# Patient Record
Sex: Female | Born: 1940 | Race: White | Hispanic: No | Marital: Married | Smoking: Never smoker
Health system: Southern US, Community
[De-identification: ages and names within clinical notes are randomized; demographics above are authoritative.]

## PROBLEM LIST (undated history)

## (undated) DIAGNOSIS — G4730 Sleep apnea, unspecified: Secondary | ICD-10-CM

## (undated) DIAGNOSIS — D631 Anemia in chronic kidney disease: Secondary | ICD-10-CM

## (undated) DIAGNOSIS — E1140 Type 2 diabetes mellitus with diabetic neuropathy, unspecified: Secondary | ICD-10-CM

## (undated) DIAGNOSIS — I10 Essential (primary) hypertension: Secondary | ICD-10-CM

## (undated) DIAGNOSIS — F419 Anxiety disorder, unspecified: Secondary | ICD-10-CM

## (undated) DIAGNOSIS — M1990 Unspecified osteoarthritis, unspecified site: Secondary | ICD-10-CM

## (undated) DIAGNOSIS — Z952 Presence of prosthetic heart valve: Secondary | ICD-10-CM

## (undated) DIAGNOSIS — E785 Hyperlipidemia, unspecified: Secondary | ICD-10-CM

## (undated) DIAGNOSIS — F03918 Unspecified dementia, unspecified severity, with other behavioral disturbance: Secondary | ICD-10-CM

## (undated) DIAGNOSIS — E669 Obesity, unspecified: Secondary | ICD-10-CM

## (undated) DIAGNOSIS — N189 Chronic kidney disease, unspecified: Secondary | ICD-10-CM

## (undated) DIAGNOSIS — K219 Gastro-esophageal reflux disease without esophagitis: Secondary | ICD-10-CM

## (undated) DIAGNOSIS — I639 Cerebral infarction, unspecified: Secondary | ICD-10-CM

## (undated) DIAGNOSIS — E559 Vitamin D deficiency, unspecified: Secondary | ICD-10-CM

## (undated) DIAGNOSIS — G709 Myoneural disorder, unspecified: Secondary | ICD-10-CM

## (undated) DIAGNOSIS — Z973 Presence of spectacles and contact lenses: Secondary | ICD-10-CM

## (undated) HISTORY — PX: COLONOSCOPY: SHX174

## (undated) HISTORY — DX: Type 2 diabetes mellitus with diabetic neuropathy, unspecified: E11.40

## (undated) HISTORY — DX: Obesity, unspecified: E66.9

## (undated) HISTORY — PX: CARDIAC CATHETERIZATION: SHX172

## (undated) HISTORY — PX: HEMORRHOID SURGERY: SHX153

## (undated) HISTORY — PX: LUMBAR FUSION: SHX111

## (undated) HISTORY — PX: CERVICAL SPINE SURGERY: SHX589

## (undated) HISTORY — DX: Essential (primary) hypertension: I10

## (undated) HISTORY — DX: Unspecified osteoarthritis, unspecified site: M19.90

## (undated) HISTORY — PX: ANKLE SURGERY: SHX546

## (undated) HISTORY — DX: Hyperlipidemia, unspecified: E78.5

## (undated) HISTORY — DX: Presence of prosthetic heart valve: Z95.2

---

## 1994-03-20 HISTORY — PX: AORTIC VALVE REPLACEMENT: SHX41

## 1998-05-26 ENCOUNTER — Encounter: Admission: RE | Admit: 1998-05-26 | Discharge: 1998-06-09 | Payer: Self-pay | Admitting: Neurological Surgery

## 1998-08-02 ENCOUNTER — Ambulatory Visit (HOSPITAL_COMMUNITY): Admission: RE | Admit: 1998-08-02 | Discharge: 1998-08-02 | Payer: Self-pay | Admitting: Chiropractic Medicine

## 1998-08-02 ENCOUNTER — Encounter: Payer: Self-pay | Admitting: Chiropractic Medicine

## 1999-04-13 ENCOUNTER — Inpatient Hospital Stay (HOSPITAL_COMMUNITY): Admission: RE | Admit: 1999-04-13 | Discharge: 1999-04-18 | Payer: Self-pay | Admitting: Cardiology

## 2001-03-07 ENCOUNTER — Ambulatory Visit (HOSPITAL_COMMUNITY): Admission: RE | Admit: 2001-03-07 | Discharge: 2001-03-07 | Payer: Self-pay | Admitting: Cardiology

## 2002-05-06 ENCOUNTER — Encounter: Payer: Self-pay | Admitting: Family Medicine

## 2002-05-06 ENCOUNTER — Ambulatory Visit (HOSPITAL_COMMUNITY): Admission: RE | Admit: 2002-05-06 | Discharge: 2002-05-06 | Payer: Self-pay | Admitting: Family Medicine

## 2002-07-03 ENCOUNTER — Ambulatory Visit (HOSPITAL_COMMUNITY): Admission: RE | Admit: 2002-07-03 | Discharge: 2002-07-03 | Payer: Self-pay | Admitting: Neurology

## 2002-07-03 ENCOUNTER — Encounter: Payer: Self-pay | Admitting: Neurology

## 2003-07-24 ENCOUNTER — Emergency Department (HOSPITAL_COMMUNITY): Admission: EM | Admit: 2003-07-24 | Discharge: 2003-07-24 | Payer: Self-pay | Admitting: Emergency Medicine

## 2003-08-25 ENCOUNTER — Ambulatory Visit (HOSPITAL_COMMUNITY): Admission: RE | Admit: 2003-08-25 | Discharge: 2003-08-25 | Payer: Self-pay | Admitting: Neurology

## 2003-10-21 ENCOUNTER — Ambulatory Visit (HOSPITAL_COMMUNITY): Admission: RE | Admit: 2003-10-21 | Discharge: 2003-10-21 | Payer: Self-pay | Admitting: Cardiology

## 2004-03-02 ENCOUNTER — Ambulatory Visit: Payer: Self-pay | Admitting: *Deleted

## 2004-05-02 ENCOUNTER — Ambulatory Visit: Payer: Self-pay | Admitting: Cardiology

## 2004-05-10 ENCOUNTER — Ambulatory Visit (HOSPITAL_COMMUNITY): Admission: RE | Admit: 2004-05-10 | Discharge: 2004-05-10 | Payer: Self-pay | Admitting: Obstetrics and Gynecology

## 2004-06-01 ENCOUNTER — Ambulatory Visit: Payer: Self-pay | Admitting: Cardiovascular Disease

## 2004-06-29 ENCOUNTER — Ambulatory Visit: Payer: Self-pay | Admitting: *Deleted

## 2004-07-27 ENCOUNTER — Ambulatory Visit: Payer: Self-pay | Admitting: *Deleted

## 2004-08-17 ENCOUNTER — Ambulatory Visit: Payer: Self-pay | Admitting: *Deleted

## 2004-10-12 ENCOUNTER — Ambulatory Visit: Payer: Self-pay | Admitting: *Deleted

## 2004-11-14 ENCOUNTER — Ambulatory Visit: Payer: Self-pay | Admitting: Cardiology

## 2004-12-13 ENCOUNTER — Ambulatory Visit: Payer: Self-pay | Admitting: *Deleted

## 2005-01-05 ENCOUNTER — Ambulatory Visit: Payer: Self-pay | Admitting: Cardiology

## 2005-02-02 ENCOUNTER — Ambulatory Visit: Payer: Self-pay | Admitting: *Deleted

## 2005-02-16 ENCOUNTER — Ambulatory Visit: Payer: Self-pay | Admitting: *Deleted

## 2005-03-21 ENCOUNTER — Ambulatory Visit: Payer: Self-pay | Admitting: *Deleted

## 2005-06-02 ENCOUNTER — Ambulatory Visit: Payer: Self-pay | Admitting: Cardiology

## 2005-06-15 ENCOUNTER — Ambulatory Visit: Payer: Self-pay | Admitting: *Deleted

## 2005-07-06 ENCOUNTER — Ambulatory Visit: Payer: Self-pay | Admitting: *Deleted

## 2005-07-28 ENCOUNTER — Ambulatory Visit: Admission: RE | Admit: 2005-07-28 | Discharge: 2005-07-28 | Payer: Self-pay | Admitting: Family Medicine

## 2005-08-15 ENCOUNTER — Ambulatory Visit: Payer: Self-pay | Admitting: Pulmonary Disease

## 2005-08-17 ENCOUNTER — Ambulatory Visit: Payer: Self-pay | Admitting: *Deleted

## 2005-09-15 ENCOUNTER — Ambulatory Visit: Payer: Self-pay | Admitting: Cardiology

## 2005-10-20 ENCOUNTER — Ambulatory Visit: Payer: Self-pay | Admitting: Cardiology

## 2005-10-23 ENCOUNTER — Ambulatory Visit: Payer: Self-pay | Admitting: *Deleted

## 2005-11-03 ENCOUNTER — Ambulatory Visit: Payer: Self-pay | Admitting: *Deleted

## 2005-12-08 ENCOUNTER — Ambulatory Visit: Payer: Self-pay | Admitting: Cardiology

## 2006-01-19 ENCOUNTER — Ambulatory Visit: Payer: Self-pay | Admitting: Cardiovascular Disease

## 2006-03-21 ENCOUNTER — Ambulatory Visit: Payer: Self-pay | Admitting: Cardiology

## 2006-03-30 ENCOUNTER — Ambulatory Visit: Payer: Self-pay | Admitting: Cardiology

## 2006-05-04 ENCOUNTER — Ambulatory Visit: Payer: Self-pay | Admitting: Internal Medicine

## 2006-06-01 ENCOUNTER — Ambulatory Visit: Payer: Self-pay | Admitting: Internal Medicine

## 2006-06-11 ENCOUNTER — Ambulatory Visit: Payer: Self-pay | Admitting: *Deleted

## 2006-06-25 ENCOUNTER — Ambulatory Visit: Payer: Self-pay | Admitting: Internal Medicine

## 2006-09-12 ENCOUNTER — Ambulatory Visit: Payer: Self-pay | Admitting: Cardiovascular Disease

## 2006-09-26 ENCOUNTER — Ambulatory Visit: Payer: Self-pay | Admitting: Cardiology

## 2006-10-24 ENCOUNTER — Ambulatory Visit: Payer: Self-pay | Admitting: Cardiology

## 2006-12-13 ENCOUNTER — Ambulatory Visit (HOSPITAL_COMMUNITY): Admission: RE | Admit: 2006-12-13 | Discharge: 2006-12-13 | Payer: Self-pay | Admitting: Family Medicine

## 2006-12-28 ENCOUNTER — Ambulatory Visit (HOSPITAL_COMMUNITY): Admission: RE | Admit: 2006-12-28 | Discharge: 2006-12-28 | Payer: Self-pay | Admitting: General Surgery

## 2007-01-21 ENCOUNTER — Ambulatory Visit: Payer: Self-pay | Admitting: Cardiology

## 2007-02-19 ENCOUNTER — Ambulatory Visit: Payer: Self-pay | Admitting: Cardiology

## 2007-03-13 ENCOUNTER — Ambulatory Visit: Payer: Self-pay | Admitting: Cardiology

## 2007-05-02 ENCOUNTER — Ambulatory Visit: Payer: Self-pay | Admitting: Cardiology

## 2007-05-23 ENCOUNTER — Ambulatory Visit: Payer: Self-pay | Admitting: Cardiology

## 2007-06-21 ENCOUNTER — Ambulatory Visit: Payer: Self-pay | Admitting: Cardiology

## 2007-09-05 ENCOUNTER — Ambulatory Visit: Payer: Self-pay | Admitting: Cardiology

## 2007-10-03 ENCOUNTER — Ambulatory Visit: Payer: Self-pay | Admitting: Cardiology

## 2007-10-04 ENCOUNTER — Ambulatory Visit (HOSPITAL_COMMUNITY): Admission: RE | Admit: 2007-10-04 | Discharge: 2007-10-04 | Payer: Self-pay | Admitting: Ophthalmology

## 2007-10-07 ENCOUNTER — Ambulatory Visit: Payer: Self-pay | Admitting: Cardiology

## 2007-10-08 ENCOUNTER — Ambulatory Visit (HOSPITAL_COMMUNITY): Admission: RE | Admit: 2007-10-08 | Discharge: 2007-10-08 | Payer: Self-pay | Admitting: Ophthalmology

## 2007-10-15 ENCOUNTER — Ambulatory Visit: Payer: Self-pay | Admitting: Cardiology

## 2007-11-01 ENCOUNTER — Emergency Department (HOSPITAL_COMMUNITY): Admission: EM | Admit: 2007-11-01 | Discharge: 2007-11-01 | Payer: Self-pay | Admitting: Emergency Medicine

## 2007-11-04 ENCOUNTER — Ambulatory Visit: Payer: Self-pay | Admitting: Cardiology

## 2007-11-08 ENCOUNTER — Ambulatory Visit: Payer: Self-pay | Admitting: Cardiology

## 2007-11-13 ENCOUNTER — Ambulatory Visit: Payer: Self-pay | Admitting: Cardiology

## 2007-11-14 ENCOUNTER — Ambulatory Visit: Payer: Self-pay | Admitting: Cardiology

## 2007-11-15 ENCOUNTER — Ambulatory Visit (HOSPITAL_COMMUNITY): Admission: RE | Admit: 2007-11-15 | Discharge: 2007-11-15 | Payer: Self-pay | Admitting: Ophthalmology

## 2007-11-25 ENCOUNTER — Emergency Department (HOSPITAL_COMMUNITY): Admission: EM | Admit: 2007-11-25 | Discharge: 2007-11-25 | Payer: Self-pay | Admitting: Emergency Medicine

## 2007-11-28 ENCOUNTER — Ambulatory Visit: Payer: Self-pay | Admitting: Cardiology

## 2007-12-12 ENCOUNTER — Ambulatory Visit: Payer: Self-pay | Admitting: Cardiology

## 2007-12-24 ENCOUNTER — Ambulatory Visit: Payer: Self-pay | Admitting: Cardiology

## 2007-12-25 ENCOUNTER — Ambulatory Visit: Payer: Self-pay | Admitting: Cardiology

## 2007-12-26 ENCOUNTER — Ambulatory Visit (HOSPITAL_COMMUNITY): Admission: RE | Admit: 2007-12-26 | Discharge: 2007-12-27 | Payer: Self-pay | Admitting: Ophthalmology

## 2007-12-26 ENCOUNTER — Encounter (INDEPENDENT_AMBULATORY_CARE_PROVIDER_SITE_OTHER): Payer: Self-pay | Admitting: Ophthalmology

## 2007-12-30 ENCOUNTER — Ambulatory Visit: Payer: Self-pay | Admitting: Cardiology

## 2008-01-06 ENCOUNTER — Ambulatory Visit: Payer: Self-pay | Admitting: Cardiology

## 2008-01-13 ENCOUNTER — Ambulatory Visit: Payer: Self-pay | Admitting: Cardiology

## 2008-02-03 ENCOUNTER — Ambulatory Visit: Payer: Self-pay | Admitting: Cardiology

## 2008-03-05 ENCOUNTER — Ambulatory Visit: Payer: Self-pay | Admitting: Cardiology

## 2008-04-02 ENCOUNTER — Ambulatory Visit: Payer: Self-pay | Admitting: Cardiology

## 2008-04-06 ENCOUNTER — Ambulatory Visit: Payer: Self-pay | Admitting: Cardiology

## 2008-04-23 ENCOUNTER — Ambulatory Visit: Payer: Self-pay | Admitting: Cardiology

## 2008-05-04 ENCOUNTER — Ambulatory Visit: Payer: Self-pay | Admitting: Cardiology

## 2008-05-21 ENCOUNTER — Ambulatory Visit: Payer: Self-pay | Admitting: Cardiology

## 2008-06-17 ENCOUNTER — Emergency Department (HOSPITAL_COMMUNITY): Admission: EM | Admit: 2008-06-17 | Discharge: 2008-06-17 | Payer: Self-pay | Admitting: Emergency Medicine

## 2008-06-18 ENCOUNTER — Ambulatory Visit: Payer: Self-pay | Admitting: Cardiology

## 2008-07-09 ENCOUNTER — Ambulatory Visit: Payer: Self-pay | Admitting: Cardiology

## 2008-08-06 ENCOUNTER — Ambulatory Visit: Payer: Self-pay | Admitting: Cardiology

## 2008-09-29 DIAGNOSIS — E669 Obesity, unspecified: Secondary | ICD-10-CM | POA: Insufficient documentation

## 2008-09-29 DIAGNOSIS — I10 Essential (primary) hypertension: Secondary | ICD-10-CM | POA: Insufficient documentation

## 2008-09-29 DIAGNOSIS — E785 Hyperlipidemia, unspecified: Secondary | ICD-10-CM | POA: Insufficient documentation

## 2008-09-29 DIAGNOSIS — M1990 Unspecified osteoarthritis, unspecified site: Secondary | ICD-10-CM | POA: Insufficient documentation

## 2008-10-01 ENCOUNTER — Ambulatory Visit: Payer: Self-pay | Admitting: Cardiology

## 2008-10-08 ENCOUNTER — Ambulatory Visit: Payer: Self-pay | Admitting: Cardiology

## 2008-10-22 ENCOUNTER — Ambulatory Visit: Payer: Self-pay | Admitting: Cardiology

## 2008-10-22 ENCOUNTER — Encounter: Payer: Self-pay | Admitting: Cardiology

## 2008-10-22 LAB — CONVERTED CEMR LAB
INR: 5.7 (ref 0.0–1.5)
Prothrombin Time: 52 s — ABNORMAL HIGH (ref 11.6–15.2)

## 2008-10-26 ENCOUNTER — Encounter: Payer: Self-pay | Admitting: Cardiology

## 2008-10-29 ENCOUNTER — Ambulatory Visit: Payer: Self-pay

## 2008-10-29 ENCOUNTER — Encounter: Payer: Self-pay | Admitting: Cardiology

## 2008-11-02 ENCOUNTER — Encounter: Payer: Self-pay | Admitting: *Deleted

## 2008-12-09 ENCOUNTER — Encounter (INDEPENDENT_AMBULATORY_CARE_PROVIDER_SITE_OTHER): Payer: Self-pay | Admitting: Cardiology

## 2009-01-07 ENCOUNTER — Encounter (INDEPENDENT_AMBULATORY_CARE_PROVIDER_SITE_OTHER): Payer: Self-pay | Admitting: Cardiology

## 2009-02-04 ENCOUNTER — Encounter (INDEPENDENT_AMBULATORY_CARE_PROVIDER_SITE_OTHER): Payer: Self-pay | Admitting: Cardiology

## 2009-03-04 ENCOUNTER — Encounter (INDEPENDENT_AMBULATORY_CARE_PROVIDER_SITE_OTHER): Payer: Self-pay | Admitting: Cardiology

## 2009-03-24 ENCOUNTER — Ambulatory Visit: Payer: Self-pay | Admitting: Cardiology

## 2009-03-24 LAB — CONVERTED CEMR LAB: POC INR: 2

## 2009-04-12 ENCOUNTER — Ambulatory Visit: Payer: Self-pay | Admitting: Cardiology

## 2009-05-10 ENCOUNTER — Ambulatory Visit: Payer: Self-pay | Admitting: Cardiology

## 2009-05-26 ENCOUNTER — Encounter (INDEPENDENT_AMBULATORY_CARE_PROVIDER_SITE_OTHER): Payer: Self-pay | Admitting: *Deleted

## 2009-07-14 ENCOUNTER — Encounter (INDEPENDENT_AMBULATORY_CARE_PROVIDER_SITE_OTHER): Payer: Self-pay | Admitting: Pharmacist

## 2009-08-11 ENCOUNTER — Ambulatory Visit: Payer: Self-pay | Admitting: Cardiology

## 2009-08-11 LAB — CONVERTED CEMR LAB: POC INR: 1.8

## 2009-09-02 ENCOUNTER — Encounter (INDEPENDENT_AMBULATORY_CARE_PROVIDER_SITE_OTHER): Payer: Self-pay | Admitting: *Deleted

## 2009-09-16 ENCOUNTER — Encounter (INDEPENDENT_AMBULATORY_CARE_PROVIDER_SITE_OTHER): Payer: Self-pay | Admitting: Pharmacist

## 2009-09-29 ENCOUNTER — Ambulatory Visit: Payer: Self-pay | Admitting: Cardiology

## 2009-09-29 LAB — CONVERTED CEMR LAB: POC INR: 1.8

## 2009-10-28 ENCOUNTER — Ambulatory Visit: Payer: Self-pay | Admitting: Cardiology

## 2009-11-25 ENCOUNTER — Ambulatory Visit: Payer: Self-pay | Admitting: Cardiology

## 2009-12-23 ENCOUNTER — Ambulatory Visit: Payer: Self-pay | Admitting: Cardiology

## 2009-12-23 LAB — CONVERTED CEMR LAB: POC INR: 3.4

## 2010-01-20 ENCOUNTER — Ambulatory Visit: Payer: Self-pay | Admitting: Cardiology

## 2010-02-17 ENCOUNTER — Encounter (INDEPENDENT_AMBULATORY_CARE_PROVIDER_SITE_OTHER): Payer: Self-pay | Admitting: *Deleted

## 2010-04-11 ENCOUNTER — Encounter: Payer: Self-pay | Admitting: Family Medicine

## 2010-04-14 ENCOUNTER — Ambulatory Visit
Admission: RE | Admit: 2010-04-14 | Discharge: 2010-04-14 | Payer: Self-pay | Source: Home / Self Care | Attending: Cardiology | Admitting: Cardiology

## 2010-04-21 NOTE — Medication Information (Signed)
Summary: ccr-lr  Anticoagulant Therapy  Managed by: Edrick Oh, RN Supervising MD: Lattie Haw MD, Herbie Baltimore Indication 1: Aortic Valve Replacement (ICD-V43.3) Indication 2: stroke (ICD-436.0) Lab Used: [facility] [place] Site: [place] INR POC 2.5  Dietary changes: no    Health status changes: no    Bleeding/hemorrhagic complications: no    Recent/future hospitalizations: no    Any changes in medication regimen? no    Recent/future dental: no  Any missed doses?: yes     Details: Missed 2 doses   ran out of meds  Is patient compliant with meds? yes       Allergies: 1)  ! * Mso4  Anticoagulation Management History:      The patient is taking warfarin and comes in today for a routine follow up visit.  Positive risk factors for bleeding include an age of 72 years or older.  The bleeding index is 'intermediate risk'.  Positive CHADS2 values include History of HTN.  Negative CHADS2 values include Age > 72 years old.  The start date was 06/29/2004.  Her last INR was 5.7.  Anticoagulation responsible provider: Lattie Haw MD, Herbie Baltimore.  INR POC: 2.5.  Cuvette Lot#: [id number].    Anticoagulation Management Assessment/Plan:      The patient's current anticoagulation dose is Coumadin 5 mg tabs: Marland Kitchen  The target INR is 2.5 - 3.5.  The next INR is due 12/23/2009.  Anticoagulation instructions were given to patient.  Results were reviewed/authorized by Edrick Oh, RN.  She was notified by Edrick Oh RN.         Prior Anticoagulation Instructions: INR 2.6 Continue coumadin 2.5mg  once daily except 5mg  on Sundays, Tuesdays and Thursdays  Current Anticoagulation Instructions: INR 2.5 Take coumadin 1 1/2 tablets tonight then resume 1/2 tablet once daily except 1 tablet on Sundays, Tuesdays and Thursdays

## 2010-04-21 NOTE — Letter (Signed)
Summary: Appointment - Missed  [name] [place]. [address], [place] [address]   Phone: [phone]  Fax: [phone]     May 26, 2009 MRN: [id number]   Mary [name] [address] [place], [place]  [address]   Dear [name],  [place] records indicate you missed your appointment on 05/26/09 [place]    It is very important that we reach you to reschedule this appointment. We look forward to participating in your health care needs. Please contact us at the number listed above at your earliest convenience to reschedule this appointment.     Sincerely,    Public relations account executive

## 2010-04-21 NOTE — Medication Information (Signed)
Summary: ccr-lr  Anticoagulant Therapy  Managed by: Edrick Oh, RN Supervising MD: Lattie Haw MD, Herbie Baltimore Indication 1: Aortic Valve Replacement (ICD-V43.3) Indication 2: stroke (ICD-436.0) Lab Used: [facility] [place] Site: [place] INR POC 4.0  Dietary changes: no    Health status changes: no    Bleeding/hemorrhagic complications: no    Recent/future hospitalizations: no    Any changes in medication regimen? no    Recent/future dental: no  Any missed doses?: no       Is patient compliant with meds? yes       Allergies: 1)  ! * Mso4  Anticoagulation Management History:      The patient is taking warfarin and comes in today for a routine follow up visit.  Positive risk factors for bleeding include an age of 72 years or older.  The bleeding index is 'intermediate risk'.  Positive CHADS2 values include History of HTN.  Negative CHADS2 values include Age > 75 years old.  The start date was 06/29/2004.  Her last INR was 5.7.  Anticoagulation responsible provider: Lattie Haw MD, Herbie Baltimore.  INR POC: 4.0.  Cuvette Lot#: [id number].    Anticoagulation Management Assessment/Plan:      The patient's current anticoagulation dose is Coumadin 5 mg tabs: Marland Kitchen  The target INR is 2.5 - 3.5.  The next INR is due 02/17/2010.  Anticoagulation instructions were given to patient.  Results were reviewed/authorized by Edrick Oh, RN.  She was notified by Edrick Oh RN.         Prior Anticoagulation Instructions: INR 3.4 Continue coumadin 2.5mg  once daily except 5mg  on Sundays, Tuesdays and Thursdays  Current Anticoagulation Instructions: INR 4.0 Hold coumadin tonight then resume 2.5mg  once daily except 5mg  on Sundays, Tuesdays and Thursdays

## 2010-04-21 NOTE — Medication Information (Signed)
Summary: ccr-lr  Anticoagulant Therapy  Managed by: Edrick Oh, RN Supervising MD: Lattie Haw MD, Herbie Baltimore Indication 1: Aortic Valve Replacement (ICD-V43.3) Indication 2: stroke (ICD-436.0) Lab Used: [facility] [place] Site: [place] INR POC 3.4  Dietary changes: no    Health status changes: no    Bleeding/hemorrhagic complications: no    Recent/future hospitalizations: no    Any changes in medication regimen? no    Recent/future dental: no  Any missed doses?: no       Is patient compliant with meds? yes       Allergies: 1)  ! * Mso4  Anticoagulation Management History:      The patient is taking warfarin and comes in today for a routine follow up visit.  Positive risk factors for bleeding include an age of 72 years or older.  The bleeding index is 'intermediate risk'.  Positive CHADS2 values include History of HTN.  Negative CHADS2 values include Age > 29 years old.  The start date was 06/29/2004.  Her last INR was 5.7.  Anticoagulation responsible provider: Lattie Haw MD, Herbie Baltimore.  INR POC: 3.4.  Cuvette Lot#: [id number].    Anticoagulation Management Assessment/Plan:      The patient's current anticoagulation dose is Coumadin 5 mg tabs: Marland Kitchen  The target INR is 2.5 - 3.5.  The next INR is due 05/10/2009.  Anticoagulation instructions were given to patient.  Results were reviewed/authorized by Edrick Oh, RN.  She was notified by Edrick Oh RN.         Prior Anticoagulation Instructions: INR 2.0 Take coumadin 1 1/2 tablets tonight then increase dose to 1/2 tablet once daily except 1 tablet on Wednesdays  Current Anticoagulation Instructions: INR 3.4 Continue coumadin 2.5mg  once daily except 5mg  on Wednesdays

## 2010-04-21 NOTE — Letter (Signed)
Summary: Custom - Delinquent Coumadin 1  [facility] [place]. [address], [place] [address]   Phone: [phone]  Fax: [phone]     September 16, 2009 MRN: [id number]   [facility] [address] [place], [place]  [address]   Dear Ms. Mary [name],  This letter is being sent to you as a reminder that it is necessary for you to get your INR/PT checked regularly so that we can optimize your care.  Our records indicate that you were scheduled to have a test done recently.  As of today, we have not received the results of this test.  It is very important that you have your INR checked.  Please call our office at the number listed above to schedule an appointment at your earliest convenience.    If you have recently had your protime checked or have discontinued this medication, please contact our office at the above phone number to clarify this issue.  Thank you for this prompt attention to this important health care matter.  Sincerely, Edrick Oh RN  [facility]

## 2010-04-21 NOTE — Medication Information (Signed)
Summary: PROTIME/TG  Anticoagulant Therapy  Managed by: Edrick Oh, RN Supervising MD: Verl Blalock MD, Marcello Moores Indication 1: Aortic Valve Replacement (ICD-V43.3) Indication 2: stroke (ICD-436.0) Lab Used: [facility] [place] Site: [place] INR POC 2.0  Dietary changes: no    Health status changes: no    Bleeding/hemorrhagic complications: no    Recent/future hospitalizations: no    Any changes in medication regimen? no    Recent/future dental: no  Any missed doses?: no       Is patient compliant with meds? yes       Allergies: 1)  ! * Mso4  Anticoagulation Management History:      The patient is taking warfarin and comes in today for a routine follow up visit.  Positive risk factors for bleeding include an age of 72 years or older.  The bleeding index is 'intermediate risk'.  Positive CHADS2 values include History of HTN.  Negative CHADS2 values include Age > 70 years old.  The start date was 06/29/2004.  Her last INR was 5.7.  Anticoagulation responsible provider: Verl Blalock MD, Marcello Moores.  INR POC: 2.0.  Cuvette Lot#: [id number].    Anticoagulation Management Assessment/Plan:      The patient's current anticoagulation dose is Coumadin 5 mg tabs: Marland Kitchen  The target INR is 2.5 - 3.5.  The next INR is due 04/14/2009.  Anticoagulation instructions were given to patient.  Results were reviewed/authorized by Edrick Oh, RN.  She was notified by Edrick Oh RN.         Prior Anticoagulation Instructions: 2.5mg  QD  Current Anticoagulation Instructions: INR 2.0 Take coumadin 1 1/2 tablets tonight then increase dose to 1/2 tablet once daily except 1 tablet on Wednesdays

## 2010-04-21 NOTE — Medication Information (Signed)
Summary: ccr-lr  Anticoagulant Therapy  Managed by: Edrick Oh, RN Supervising MD: Lattie Haw MD, Herbie Baltimore Indication 1: Aortic Valve Replacement (ICD-V43.3) Indication 2: stroke (ICD-436.0) Lab Used: [facility] [place] Site: [place] INR POC 2.6  Dietary changes: no    Health status changes: no    Bleeding/hemorrhagic complications: no    Recent/future hospitalizations: no    Any changes in medication regimen? no    Recent/future dental: no  Any missed doses?: no       Is patient compliant with meds? yes       Allergies: 1)  ! * Mso4  Anticoagulation Management History:      The patient is taking warfarin and comes in today for a routine follow up visit.  Positive risk factors for bleeding include an age of 72 years or older.  The bleeding index is 'intermediate risk'.  Positive CHADS2 values include History of HTN.  Negative CHADS2 values include Age > 72 years old.  The start date was 06/29/2004.  Her last INR was 5.7.  Anticoagulation responsible Tai Syfert: Lattie Haw MD, Herbie Baltimore.  INR POC: 2.6.  Cuvette Lot#: [id number].    Anticoagulation Management Assessment/Plan:      The patient's current anticoagulation dose is Coumadin 5 mg tabs: Marland Kitchen  The target INR is 2.5 - 3.5.  The next INR is due 11/25/2009.  Anticoagulation instructions were given to patient.  Results were reviewed/authorized by Edrick Oh, RN.  She was notified by Edrick Oh RN.         Prior Anticoagulation Instructions: INR 1.8 Take coumadin 1 tablet tonight then increase dose to 1/2 tablet once daily except 1 tablet on Sundays, Tuesdays and Thursdays  Current Anticoagulation Instructions: INR 2.6 Continue coumadin 2.5mg  once daily except 5mg  on Sundays, Tuesdays and Thursdays

## 2010-04-21 NOTE — Letter (Signed)
Summary: Appointment - Missed  [facility] [place]. [address] [place] [address]   Phone: [phone]  Fax: [phone]     February 17, 2010 MRN: [id number]   Mary [name] [address] [place], [place]  [address]   Dear [name],  [place] records indicate you missed your appointment on       02/17/10 [facility]              It is very important that we reach you to reschedule this appointment. We look forward to participating in your health care needs. Please contact us at the number listed above at your earliest convenience to reschedule this appointment.     Sincerely,    Public relations account executive

## 2010-04-21 NOTE — Medication Information (Signed)
Summary: CCR  Anticoagulant Therapy  Managed by: Edrick Oh, RN Supervising MD: Domenic Polite MD, Mikeal Hawthorne Indication 1: Aortic Valve Replacement (ICD-V43.3) Indication 2: stroke (ICD-436.0) Lab Used: [facility] [place] Site: [place] INR POC 1.8  Dietary changes: no    Health status changes: no    Bleeding/hemorrhagic complications: no    Recent/future hospitalizations: no    Any changes in medication regimen? no    Recent/future dental: no  Any missed doses?: no       Is patient compliant with meds? yes       Allergies: 1)  ! * Mso4  Anticoagulation Management History:      The patient is taking warfarin and comes in today for a routine follow up visit.  Positive risk factors for bleeding include an age of 72 years or older.  The bleeding index is 'intermediate risk'.  Positive CHADS2 values include History of HTN.  Negative CHADS2 values include Age > 72 years old.  The start date was 06/29/2004.  Her last INR was 5.7.  Anticoagulation responsible provider: Domenic Polite MD, Mikeal Hawthorne.  INR POC: 1.8.  Cuvette Lot#: [id number].    Anticoagulation Management Assessment/Plan:      The patient's current anticoagulation dose is Coumadin 5 mg tabs: Marland Kitchen  The target INR is 2.5 - 3.5.  The next INR is due 10/28/2009.  Anticoagulation instructions were given to patient.  Results were reviewed/authorized by Edrick Oh, RN.  She was notified by Edrick Oh RN.         Prior Anticoagulation Instructions: INR 1.8 Take coumadin 1 1/2 tablets tonight, 1 tablet tomorrow night then increase dose to 2.5mg  once daily except 5mg  on Sundays and Wednesdays   Current Anticoagulation Instructions: INR 1.8 Take coumadin 1 tablet tonight then increase dose to 1/2 tablet once daily except 1 tablet on Sundays, Tuesdays and Thursdays

## 2010-04-21 NOTE — Letter (Signed)
Summary: Appointment - Missed   [facility] [place]. [address] [place] [address]   Phone: [phone]  Fax: [phone]     September 02, 2009 MRN: [id number]   Mary [name] [address] [place], [place]  [address]   Dear Mary [name],  [place] records indicate you missed your appointment on          09/02/09 [facility]             It is very important that we reach you to reschedule this appointment. We look forward to participating in your health care needs. Please contact us at the number listed above at your earliest convenience to reschedule this appointment.     Sincerely,    Public relations account executive

## 2010-04-21 NOTE — Letter (Signed)
Summary: Custom - Delinquent Coumadin 2  [facility] [place]. [address] [place] [address]   Phone: [phone]  Fax: [phone]     July 14, 2009 MRN: [id number]   [facility] [address] [place], [place]  [address]   Dear [name],  We have attempted to contact you by phone and letter on multiple occasions to contact our office for important blood work associated with the blood thinner, warfarin (Coumadin).  Warfarin is a very important drug that can cause life threatening side effects including, bleeding, and thus requires close laboratory monitoring.  We are unable to accept responsibility for blood thinner-related health problems you may develop because you have not followed our recommendations for appropriate monitoring.  These may include abnormal bleeding occurrences and/or development of blood clots (stroke, heart attack, blood clots in legs or lungs, etc.).  We need for you to contact this office at the number listed above to schedule and complete this very important blood work.  Thank you for your assistance in this urgent matter.  Sincerely, Edrick Oh, RN [facility]

## 2010-04-21 NOTE — Medication Information (Signed)
Summary: ccr-past due-lr  Anticoagulant Therapy  Managed by: Edrick Oh, RN Supervising MD: Domenic Polite MD, Mikeal Hawthorne Indication 1: Aortic Valve Replacement (ICD-V43.3) Indication 2: stroke (ICD-436.0) Lab Used: [facility] [place] Site: [place] INR POC 1.8  Dietary changes: no    Health status changes: no    Bleeding/hemorrhagic complications: no    Recent/future hospitalizations: no    Any changes in medication regimen? no    Recent/future dental: no  Any missed doses?: no       Is patient compliant with meds? 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  She is also non-compliant with keeping INR appts inspite of reminder letters   Allergies: 1)  ! * Mso4  Anticoagulation Management History:      The patient is taking warfarin and comes in today for a routine follow up visit.  Positive risk factors for bleeding include an age of 72 years or older.  The bleeding index is 'intermediate risk'.  Positive CHADS2 values include History of HTN.  Negative CHADS2 values include Age > 78 years old.  The start date was 06/29/2004.  Her last INR was 5.7.  Anticoagulation responsible provider: Domenic Polite MD, Mikeal Hawthorne.  INR POC: 1.8.  Cuvette Lot#: [id number].    Anticoagulation Management Assessment/Plan:      The patient's current anticoagulation dose is Coumadin 5 mg tabs: Marland Kitchen  The target INR is 2.5 - 3.5.  The next INR is due 09/02/2009.  Anticoagulation instructions were given to patient.  Results were reviewed/authorized by Edrick Oh, RN.  She was notified by Edrick Oh RN.         Prior Anticoagulation Instructions: INR 1.9 Take coumadin 1 tablet x 2 days then resume 1/2 tablet once daily except 1 tablet on Wednesdays   Current Anticoagulation Instructions: INR 1.8 Take coumadin 1 1/2 tablets tonight, 1 tablet tomorrow night then increase dose to 2.5mg  once daily except 5mg  on Sundays and Wednesdays

## 2010-04-21 NOTE — Medication Information (Signed)
Summary: ccr-lr  Anticoagulant Therapy  Managed by: Edrick Oh, RN Supervising MD: Lattie Haw MD, Herbie Baltimore Indication 1: Aortic Valve Replacement (ICD-V43.3) Indication 2: stroke (ICD-436.0) Lab Used: [facility] [place] Site: [place] INR POC 1.9  Dietary changes: no    Health status changes: no    Bleeding/hemorrhagic complications: no    Recent/future hospitalizations: no    Any changes in medication regimen? yes       Details: on abx for cold   doesn't know the name  has 2 more days left  Recent/future dental: no  Any missed doses?: no       Is patient compliant with meds? yes       Allergies: 1)  ! * Mso4  Anticoagulation Management History:      The patient is taking warfarin and comes in today for a routine follow up visit.  Positive risk factors for bleeding include an age of 72 years or older.  The bleeding index is 'intermediate risk'.  Positive CHADS2 values include History of HTN.  Negative CHADS2 values include Age > 86 years old.  The start date was 06/29/2004.  Her last INR was 5.7.  Anticoagulation responsible provider: Lattie Haw MD, Herbie Baltimore.  INR POC: 1.9.  Cuvette Lot#: [id number].    Anticoagulation Management Assessment/Plan:      The patient's current anticoagulation dose is Coumadin 5 mg tabs: Marland Kitchen  The target INR is 2.5 - 3.5.  The next INR is due 05/26/2009.  Anticoagulation instructions were given to patient.  Results were reviewed/authorized by Edrick Oh, RN.  She was notified by Edrick Oh RN.         Prior Anticoagulation Instructions: INR 3.4 Continue coumadin 2.5mg  once daily except 5mg  on Wednesdays  Current Anticoagulation Instructions: INR 1.9 Take coumadin 1 tablet x 2 days then resume 1/2 tablet once daily except 1 tablet on Wednesdays

## 2010-04-21 NOTE — Medication Information (Signed)
Summary: protime/tg  Anticoagulant Therapy  Managed by: Edrick Oh, RN Supervising MD: Lattie Haw MD, Herbie Baltimore Indication 1: Aortic Valve Replacement (ICD-V43.3) Indication 2: stroke (ICD-436.0) Lab Used: [facility] [place] Site: [place] INR POC 3.9  Dietary changes: no    Health status changes: no    Bleeding/hemorrhagic complications: no    Recent/future hospitalizations: no    Any changes in medication regimen? no    Recent/future dental: no  Any missed doses?: no       Is patient compliant with meds? yes       Allergies: 1)  ! * Mso4  Anticoagulation Management History:      The patient is taking warfarin and comes in today for a routine follow up visit.  Positive risk factors for bleeding include an age of 72 years or older.  The bleeding index is 'intermediate risk'.  Positive CHADS2 values include History of HTN.  Negative CHADS2 values include Age > 73 years old.  The start date was 06/29/2004.  Her last INR was 5.7.  Anticoagulation responsible provider: Lattie Haw MD, Herbie Baltimore.  INR POC: 3.9.  Cuvette Lot#: [id number].    Anticoagulation Management Assessment/Plan:      The patient's current anticoagulation dose is Coumadin 5 mg tabs: Marland Kitchen  The target INR is 2.5 - 3.5.  The next INR is due 05/12/2010.  Anticoagulation instructions were given to patient.  Results were reviewed/authorized by Edrick Oh, RN.  She was notified by Edrick Oh RN.         Prior Anticoagulation Instructions: INR 4.0 Hold coumadin tonight then resume 2.5mg  once daily except 5mg  on Sundays, Tuesdays and Thursdays  Current Anticoagulation Instructions: INR 3.9 Decrease coumadin to 2.5mg  once daily except 5mg  on Tuesdays and Saturdays

## 2010-04-21 NOTE — Medication Information (Signed)
Summary: ccr-lr  Anticoagulant Therapy  Managed by: Edrick Oh, RN Supervising MD: Lattie Haw MD, Herbie Baltimore Indication 1: Aortic Valve Replacement (ICD-V43.3) Indication 2: stroke (ICD-436.0) Lab Used: [facility] [place] Site: [place] INR POC 3.4  Dietary changes: no    Health status changes: no    Bleeding/hemorrhagic complications: no    Recent/future hospitalizations: no    Any changes in medication regimen? no    Recent/future dental: no  Any missed doses?: no       Is patient compliant with meds? yes       Allergies: 1)  ! * Mso4  Anticoagulation Management History:      The patient is taking warfarin and comes in today for a routine follow up visit.  Positive risk factors for bleeding include an age of 71 years or older.  The bleeding index is 'intermediate risk'.  Positive CHADS2 values include History of HTN.  Negative CHADS2 values include Age > 39 years old.  The start date was 06/29/2004.  Her last INR was 5.7.  Anticoagulation responsible provider: Lattie Haw MD, Herbie Baltimore.  INR POC: 3.4.  Cuvette Lot#: [id number].    Anticoagulation Management Assessment/Plan:      The patient's current anticoagulation dose is Coumadin 5 mg tabs: Marland Kitchen  The target INR is 2.5 - 3.5.  The next INR is due 01/20/2010.  Anticoagulation instructions were given to patient.  Results were reviewed/authorized by Edrick Oh, RN.  She was notified by Edrick Oh RN.         Prior Anticoagulation Instructions: INR 2.5 Take coumadin 1 1/2 tablets tonight then resume 1/2 tablet once daily except 1 tablet on Sundays, Tuesdays and Thursdays  Current Anticoagulation Instructions: INR 3.4 Continue coumadin 2.5mg  once daily except 5mg  on Sundays, Tuesdays and Thursdays

## 2010-05-12 ENCOUNTER — Encounter (INDEPENDENT_AMBULATORY_CARE_PROVIDER_SITE_OTHER): Payer: Self-pay | Admitting: *Deleted

## 2010-05-17 NOTE — Letter (Signed)
Summary: Appointment - Missed  [facility] [place]. [address] [place] [address]   Phone: [phone]  Fax: [phone]     May 12, 2010 MRN: [id number]   [facility] [address] [place], [place]  [address]   Dear Ms. Crispo,  Our records indicate you missed your appointment on    05/12/10 [facility]IN CLIN[facility]                     It is very important that we reach you to reschedule this appointment. We look forward to participating in your health care needs. Please contact us at the number listed above at your earliest convenience to reschedule this appointment.     Sincerely,    Public relations account executive

## 2010-06-06 ENCOUNTER — Encounter: Payer: Self-pay | Admitting: Cardiology

## 2010-06-06 ENCOUNTER — Encounter (INDEPENDENT_AMBULATORY_CARE_PROVIDER_SITE_OTHER): Payer: Medicare Other

## 2010-06-06 DIAGNOSIS — I6789 Other cerebrovascular disease: Secondary | ICD-10-CM

## 2010-06-06 DIAGNOSIS — Z7901 Long term (current) use of anticoagulants: Secondary | ICD-10-CM

## 2010-06-16 NOTE — Medication Information (Signed)
Summary: ccr-lr  Anticoagulant Therapy  Managed by: Edrick Oh, RN Supervising MD: Lattie Haw MD, Herbie Baltimore Indication 1: Aortic Valve Replacement (ICD-V43.3) Indication 2: stroke (ICD-436.0) Lab Used: [facility]  Site: [place] INR POC 2.3  Dietary changes: no    Health status changes: no    Bleeding/hemorrhagic complications: no    Recent/future hospitalizations: no    Any changes in medication regimen? no    Recent/future dental: no  Any missed doses?: no       Is patient compliant with meds? yes       Allergies: 1)  ! * Mso4  Anticoagulation Management History:      The patient is taking warfarin and comes in today for a routine follow up visit.  Positive risk factors for bleeding include an age of 72 years or older.  The bleeding index is 'intermediate risk'.  Positive CHADS2 values include History of HTN.  Negative CHADS2 values include Age > 49 years old.  The start date was 06/29/2004.  Her last INR was 5.7.  Anticoagulation responsible provider: Lattie Haw MD, Herbie Baltimore.  INR POC: 2.3.  Cuvette Lot#: [id number].    Anticoagulation Management Assessment/Plan:      The patient's current anticoagulation dose is Coumadin 5 mg tabs: Marland Kitchen  The target INR is 2.5 - 3.5.  The next INR is due 07/04/2010.  Anticoagulation instructions were given to patient.  Results were reviewed/authorized by Edrick Oh, RN.  She was notified by Edrick Oh RN.         Prior Anticoagulation Instructions: INR 3.9 Decrease coumadin to 2.5mg  once daily except 5mg  on Tuesdays and Saturdays  Current Anticoagulation Instructions: INR 2.3 Increase coumadin to 2.5mg  once daily except 5mg  on Mondays, Wednesdays and Fridays

## 2010-06-30 ENCOUNTER — Encounter: Payer: Self-pay | Admitting: Cardiology

## 2010-06-30 DIAGNOSIS — I359 Nonrheumatic aortic valve disorder, unspecified: Secondary | ICD-10-CM | POA: Insufficient documentation

## 2010-06-30 DIAGNOSIS — Z952 Presence of prosthetic heart valve: Secondary | ICD-10-CM

## 2010-06-30 DIAGNOSIS — I639 Cerebral infarction, unspecified: Secondary | ICD-10-CM | POA: Insufficient documentation

## 2010-07-04 ENCOUNTER — Encounter: Payer: Self-pay | Admitting: *Deleted

## 2010-07-04 ENCOUNTER — Encounter: Payer: Medicare Other | Admitting: *Deleted

## 2010-07-05 ENCOUNTER — Other Ambulatory Visit (HOSPITAL_COMMUNITY): Payer: Self-pay | Admitting: Family Medicine

## 2010-07-05 DIAGNOSIS — Z139 Encounter for screening, unspecified: Secondary | ICD-10-CM

## 2010-07-11 ENCOUNTER — Encounter: Payer: Self-pay | Admitting: *Deleted

## 2010-07-12 ENCOUNTER — Ambulatory Visit (HOSPITAL_COMMUNITY)
Admission: RE | Admit: 2010-07-12 | Discharge: 2010-07-12 | Disposition: A | Discharge status: Home / Self Care | Payer: Medicare Other | Source: Ambulatory Visit | Attending: Family Medicine | Admitting: Family Medicine

## 2010-07-12 ENCOUNTER — Encounter: Payer: Self-pay | Admitting: *Deleted

## 2010-07-12 DIAGNOSIS — Z139 Encounter for screening, unspecified: Secondary | ICD-10-CM

## 2010-07-12 DIAGNOSIS — M810 Age-related osteoporosis without current pathological fracture: Secondary | ICD-10-CM | POA: Insufficient documentation

## 2010-07-12 DIAGNOSIS — Z1231 Encounter for screening mammogram for malignant neoplasm of breast: Secondary | ICD-10-CM | POA: Insufficient documentation

## 2010-07-14 ENCOUNTER — Ambulatory Visit (INDEPENDENT_AMBULATORY_CARE_PROVIDER_SITE_OTHER): Payer: Medicare Other | Admitting: *Deleted

## 2010-07-14 DIAGNOSIS — I359 Nonrheumatic aortic valve disorder, unspecified: Secondary | ICD-10-CM

## 2010-07-14 DIAGNOSIS — I639 Cerebral infarction, unspecified: Secondary | ICD-10-CM

## 2010-07-14 DIAGNOSIS — I6350 Cerebral infarction due to unspecified occlusion or stenosis of unspecified cerebral artery: Secondary | ICD-10-CM

## 2010-07-14 DIAGNOSIS — Z952 Presence of prosthetic heart valve: Secondary | ICD-10-CM

## 2010-07-14 LAB — POCT INR: INR: 2.8

## 2010-08-02 NOTE — Letter (Signed)
October 07, 2007    Ara Kussmaul, M.D.  [place], [place] [address]   RE:  SHEMICKA, ANGERMAN  MRN:  [id number]  /  DOB:  [birth year]   Dear Dr. Ricki Miller:   Ms. Mary [name] was seen in the office today after she called requesting  advice regarding anticoagulation therapy.  As you know, she requires  cataract surgery.  She was told to hold her Coumadin for 3 days prior to  surgery, but INR was 1.8 on the day of the procedure resulting in  cancellation.  She was subsequently advised to continue to hold  Coumadin.  She did not feel comfortable about that recommendation and  called the office.  I requested that she be seen, since I have not  evaluated her for the past 3 years.   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  She recovered entirely from  that event.  The last time she required surgery was in 2001 when repair  of a tendon in the left ankle was undertaken.  She was treated with pre  and postoperative Lovenox on that occasion.   Ms. Mary [name] has done well from the symptomatic standpoint.  She has no  chest discomfort nor dyspnea.  Anticoagulation has been uneventful.   MEDICATIONS:  1. In addition to Coumadin, she takes omeprazole 20 mg daily.  2. Vytorin 10/80 mg daily.  3. Toprol 50 mg daily.  4. TriCor 145 mg daily.  5. Lexapro 20 mg daily.  6. She uses Xanax and hydrocodone/Tylenol on a p.r.n. basis.   PHYSICAL EXAMINATION:  GENERAL:  Pleasant somewhat overweight woman in  no acute distress.  VITAL SIGNS:  The weight is 215, 3 pounds more than in 2006.  Blood  pressure 135/75, heart rate 85 and regular, and respirations 14.  NECK:  No jugular venous distention; normal carotid upstrokes without  transmitted murmur or bruit bilaterally.  LUNGS:  Clear.  CARDIAC:  Prosthetic first and second heart sounds; increased intensity  of second heart sounds; grade [id number] systolic ejection murmur at the  cardiac base.  ABDOMEN:  Soft and nontender; no organomegaly.  EXTREMITIES:  No edema.   EKG:  Normal sinus rhythm; within normal limits.   INR is 1.5.   IMPRESSION:  Ms. Mary [name] is at fairly high risk for a thromboembolic  event with discontinuation of anticoagulation.  Although, a St. Jude  valve in the aortic position is not a major concern in and of itself, it  is when there has been a history of possible thromboembolism in the  past.  We will ask her to stop her Coumadin 4 days prior to planned  surgery.  Lovenox will be started 2 days prior to the planned surgery  and stopped at least 12 hours before.  She will resume Lovenox on the  evening of surgery and continue until INR is once again therapeutic.  If  you have any questions regarding these recommendations, please let me  know.    Sincerely,      Cristopher Estimable. Lattie Haw, MD, [facility]  Electronically Signed    RMR/MedQ  DD: 10/09/2007  DT: 10/10/2007  Job #: [id number]   CC:    Estill Bamberg. Karie Kirks, M.D.

## 2010-08-02 NOTE — Op Note (Signed)
NAME:  MAYKAYLA, RAWN                 ACCOUNT NO.:  [id number]   MEDICAL RECORD NO.:  [id number]          PATIENT TYPE:  OIB   LOCATION:  5151                         FACILITY:  [facility]   PHYSICIAN:  Chrystie Nose. Zigmund Daniel, M.D. DATE OF BIRTH:  September 15, 1940   DATE OF PROCEDURE:  12/26/2007  DATE OF DISCHARGE:                               OPERATIVE REPORT   ADMISSION DIAGNOSES:  Retained lens material and dislocated intraocular  lens partially in anterior chamber and partially in vitreous, right eye.   PROCEDURES:  Pars plana vitrectomy, retinal photocoagulation, removal of  intraocular lens from vitreous and anterior chamber, placement of  secondary intraocular lens with suture, right eye.   SURGEON:  Chrystie Nose. Zigmund Daniel, MD   ASSISTANT:  Deatra Ina, MA   ANESTHESIA:  General.   DETAILS:  Usual prep and drape.  Indirect ophthalmoscope laser was moved  into place.  1082 burns were placed in 2 rows around the retinal  periphery in weak areas of retina.  The power was between 501,000  milliwatts, 1000 microns each, and 0.1 seconds each.  A conjunctival  peritomy from 8 o'clock around to 4 o'clock.  Scleral flaps were raised  at 3 o'clock and 9 o'clock in anticipation of IOL suture.  25-gauge  trocars placed at 8, 10, and 2 o'clock, infusion at 8 o'clock.  A  lighted pick and a cutter placed at 10 and 2 o'clock.  A Provisc was  placed on the corneal surface.  The pars plana vitrectomy was begun in  the pupillary axis.  There was vitreous adherent to the intraocular  lens.  This was freed from the edges of the intraocular lens.  The  vitrectomy was carried posteriorly where lens fragments were seen.  These were removed under low suction and rapid cutting for 360 degrees.  The vitrectomy was carried down to the macular surface where pigment was  removed.  The vitrectomy was carried into the far periphery where the  vitreous base was trimmed for 360 degrees.  Once this was accomplished,  the  vitrectomy instruments were removed from the eye and plugs were  placed.  A 3-layered corneoscleral wound was made between 10 o'clock and  2 o'clock 2 mm back from the limbus.  The intraocular lens was passed  from the vitreous into the anterior chamber and out through the corneal  wound.  Additional vitrectomy clean-up was performed at this time  through the anterior segment wound.  Two 10-0 Prolene sutures were  passed from 3 o'clock to 9 o'clock beneath the scleral flap into the  ciliary sulcus behind the iris and anterior capsular remnants, then  across the pupillary axis anterior to capsular remnants and behind the  iris then out beneath the other scleral flap.  These sutures were  externalized through this disc corneal wound.  A new intraocular lens  model [employer]. model CZ70BD, power 22.5D, length 12.5 mm,  optic 7.0 mm, expiration date April 2013, serial number [id number] was  brought onto the field.  The lens was inspected and cleaned.  The  Prolene sutures  were attached to the eyelets of the lens.  Lens was  passed into the anterior chamber then into the posterior chamber and the  ciliary sulcus.  It was dialed into place.  The sutures were drawn  securely and knotted beneath the scleral flaps.  The cornea was closed  with 6 interrupted 10-0 nylon sutures.  It was tested and found to be  tight.  Additional vitrectomy was performed at this time removing  vitreous debris.  Once this was totally cleaned, a 30% gas-fluid  exchange was performed.  The instruments were removed from the eye and  the 25-gauge trocars were removed.  The conjunctiva was closed with 7-0  chromic suture.  The wounds were covered.  Polymyxin and gentamicin were  irrigated into [place] space.  Atropine solution was applied.  Marcaine  was injected around the globe for postop pain.  Polysporin ophthalmic  ointment was placed as well as a patch and shield.  Closing pressure was  10 with a  Barraquer tonometer.   COMPLICATIONS:  None.   DURATION:  2 hours.   The patient was awakened and taken to recovery in satisfactory  condition.      Chrystie Nose. Zigmund Daniel, M.D.  Electronically Signed     JDM/MEDQ  D:  12/26/2007  T:  12/27/2007  Job:  [id number]

## 2010-08-02 NOTE — Op Note (Signed)
NAME:  Mary [name], Mary [name] NO.:  [id number]   MEDICAL RECORD NO.:  [id number]          PATIENT TYPE:  AMB   LOCATION:  SDS                          FACILITY:  [facility]   PHYSICIAN:  Garlan Fair., M.D.DATE OF BIRTH:  [birth year]   DATE OF PROCEDURE:  DATE OF DISCHARGE:  11/15/2007                               OPERATIVE REPORT   SURGEON:  Ara Kussmaul, M.D.   PREOPERATIVE DIAGNOSIS:  Immature cataract, right eye.   POSTOPERATIVE DIAGNOSIS:  Immature cataract, right eye.   OPERATION:  Kelman phacoemulsification cataract, right eye with  intraocular lens implantation.   ANESTHESIA:  General.   JUSTIFICATION FOR PROCEDURE:  This is a 70 year old lady who complains  of blurring of vision with difficulty seeing to read.  She was evaluated  and found to have a visual acuity best corrected to 20/200 on the right  eye, approximately 20/60 on the left.  There was a dense 3+ nuclear  sclerotic cataract of the right eye with posterior capsular changes.  Cataract extraction of the right eye was recommended.  She is admitted  at this time for that purpose.  The patient is being chronically  anticoagulated because of her propensity toward strokes.  She had a  really high INR failure on a previous occasion when she had been  scheduled for surgery.  Now, her cardiologist tells [place] that she can  undergo the procedure under general anesthesia, although her INR is  still slightly elevated.  Therefore, she is having the procedure done  under general anesthesia, so as to medicating the possibility of  retrobulbar hemorrhage when the block is given.   PROCEDURE:  Under influence of general inhalation anesthesia, the  patient was prepped and draped in the usual manner.  The lid speculum  was inserted under the upper and lower lid of the right eye and a 4-0  silk traction suture was passed through the belly of the superior rectus  muscle retraction.  A fornix-based  conjunctival flap was turned and  hemostasis was achieved by using cautery.  An incision was made in the  sclera at the limbus.  This incision was dissected down into the clear  cornea using crescent blade.  A sideport incision was made at the 1:30  o'clock position.  OcuCoat was injected into the eye through the  sideport incision.  The anterior chamber was then entered through the  corneoscleral tunnel incision at the 11:30 o'clock position using a 2.75-  mm keratome.  An anterior capsulotomy was done and the nucleus was  hydrodissected using Xylocaine.  The KPE handpiece was passed into the  eye, and the nucleus was begun to be emulsified.  Suddenly,  it was  noticed that the anterior chamber deepened and the nucleus seemed to  dislocate towards the posterior pole.  The procedure was stopped, and  the corneoscleral wound was opened using the corneal scissors, first  toward the right and toward the left placing a single 8-0 Vicryl suture  across each eyelid incision that was made respectively toward the left  and toward the right.  Then, nucleus was then manually expressed from  the eye using gentle traction on the globes so as to express the nucleus  from the eye.  The nucleus came out in fragments.  The 2 previously  placed sutures were closed and an anterior vitrectomy was done using  vitrector.  A peripheral iridectomy made in the iris.  The anterior  chamber was reformed with OcuCoat, and an anterior chamber lens was then  seated across the pupil into the chamber angle without difficulty.  The  corneoscleral wound was closed  using interrupted sutures of 8-0 Vicryl  and 10-0 nylon.  After ascertaining that the wound was airtight and  watertight, the conjunctiva was closed over wound using thermal cautery.  1 mL of Celestone and 0.5 mL of gentamicin were injected  subconjunctivally.  Maxitrol ophthalmic ointment and prilocaine ointment  were applied along with a patch and Fox shield.   The patient tolerated  the procedure well and was discharged to the postanesthesia recovery  room in satisfactory condition.  She is instructed to rest today, to  take Vicodin every 4 hours as needed for pain, and to see me in my  office tomorrow for further evaluation.   DISCHARGE DIAGNOSIS:  Immature cataract, right eye.      Garlan Fair., M.D.  Electronically Signed     TB/MEDQ  D:  11/16/2007  T:  11/17/2007  Job:  [id number]

## 2010-08-02 NOTE — H&P (Signed)
NAME:  Mary [name], Mary [name]                 ACCOUNT NO.:  [id number]   MEDICAL RECORD NO.:  [id number]          PATIENT TYPE:  AMB   LOCATION:  DAY                           FACILITY:  [facility]   PHYSICIAN:  Jamesetta So, M.D.  DATE OF BIRTH:  [birth year]   DATE OF ADMISSION:  12/18/2006  DATE OF DISCHARGE:  LH                              HISTORY & PHYSICAL   CHIEF COMPLAINT:  Need for screening colonoscopy.   HISTORY OF PRESENT ILLNESS:  The patient is a 70 year old, white female,  who is referred for endoscopic evaluation.  She needs a colonoscopy for  screening purposes.  No abdominal pain, weight loss, nausea, vomiting,  diarrhea, constipation, melena, or hematochezia have been noted.  She  has never had a colonoscopy.  There is no family history of colon  carcinoma.   PAST MEDICAL HISTORY:  1. Hypertension.  2. Aortic valvular disease.   PAST SURGICAL HISTORY:  Aortic valve replacement.   CURRENT MEDICATIONS:  Coumadin, Metoprolol.   ALLERGIES:  No known drug allergies.   REVIEW OF SYSTEMS:  The patient denies drinking or smoking.  She denies  any recent chest pain, MI, CVA, or diabetes mellitus.   PHYSICAL EXAMINATION:  GENERAL:  The patient is a well-developed and  well-nourished white female in no acute distress.  LUNGS:  Clear to auscultation with equal breath sounds bilaterally.  HEART:  Regular rate and rhythm with an S1 click due to the aortic valve  replacement.  ABDOMEN:  Soft, nontender, and nondistended.  No hepatosplenomegaly or  masses are noted.  RECTAL EXAMINATION:  Deferred to the procedure.   IMPRESSION:  Need for screening colonoscopy.   PLAN:  The patient is scheduled for a colonoscopy on December 18, 2006.  The risks and benefits of the procedure including bleeding and  perforation were fully explained to the patient, who gave informed  consent.  She will continue to take her Coumadin due to her heart valve.  Should she need a polypectomy, this will be  deferred to another  procedure.  The patient understands this.      Jamesetta So, M.D.  Electronically Signed     MAJ/MEDQ  D:  12/06/2006  T:  12/06/2006  Job:  [id number]   cc:   Estill Bamberg. Karie Kirks, M.D.  Fax: [phone]

## 2010-08-05 NOTE — Procedures (Signed)
NAME:  Mary [name], Mary [name]                           ACCOUNT NO.:  [id number]   MEDICAL RECORD NO.:  [id number]                   PATIENT TYPE:  AMB   LOCATION:  DAY                                  FACILITY:  [facility]   PHYSICIAN:  Jacqulyn Ducking, M.D.               DATE OF BIRTH:  Dec 22, 1940   DATE OF PROCEDURE:  10/21/2003  DATE OF DISCHARGE:                          TRANSESOPHAGEAL ECHOCARDIOGRAM   REFERRING PHYSICIAN:  1. Estill Bamberg. Karie Kirks, M.D.  2. Jacqulyn Ducking, M.D.   CLINICAL DATA:  A 70 year old woman with TIA.   IMPRESSION:  1. Technically adequate transesophageal echocardiographic study.  2. Mild left atrial enlargement;  normal right atrial size.  Grossly normal     right ventricular size and function.  3. Normal mitral valve;  mild annular calcification;  mild regurgitation.  4. Small left atrial appendage;  no echocardiographic contrast nor thrombus;     minimal atrial septal aneurysm.  5. Contrast study normal without passage from the right heart to the left     heart.  6. Mechanical prosthetic valve in the aortic position;  mild insufficiency,     which is normal for a device of this type.  7. Normal tricuspid and pulmonic valves.  8. Mild lipomatous hypertrophy of the intra-atrial septum superior to and     inferior to a normal foramen ovale.  9. Very mild atherosclerosis of the thoracic aorta.  10.      Normal internal dimension of the left ventricle;  mild hypertrophy;     normal regional and global function.      ___________________________________________                                            Jacqulyn Ducking, M.D.   RR/MEDQ  D:  10/21/2003  T:  10/21/2003  Job:  [id number]

## 2010-08-05 NOTE — Procedures (Signed)
NAME:  Mary [name], Mary [name]                 ACCOUNT NO.:  [id number]   MEDICAL RECORD NO.:  [id number]          PATIENT TYPE:  OUT   LOCATION:  SLEEP LAB                     FACILITY:  [facility]   PHYSICIAN:  Danton Sewer, M.D. [facility] DATE OF BIRTH:  August 23, 1940   DATE OF STUDY:  07/28/2005                              NOCTURNAL POLYSOMNOGRAM    REFERRING PHYSICIAN:  Dr. Newt Minion   INDICATION FOR STUDY:  Hypersomnia with sleep apnea.   EPWORTH SLEEPINESS SCORE:  5.   SLEEP ARCHITECTURE:  The patient had a total sleep time of 231 minutes with  significantly decreased REM and never achieved slow wave sleep.  Sleep onset  latency was very prolonged at 107 minutes and REM onset was at the upper  limits of normal.  Sleep efficiency was decreased at 59%.   RESPIRATORY DATA:  The patient was found to have 36 hypopneas and 18 apneas  for a respiratory disturbance index of 14 events per hour.  The events  occurred more commonly in the supine position and there was loud snoring  noted throughout.  The patient did not meet split night protocol secondary  to the small numbers of events that occurred prior to 1 a.m.   OXYGEN DATA:  The patient had O2 desaturation as low as 84% with the  obstructive events.   CARDIAC DATA:  The patient was noted to have occasional PVC throughout, but  no clinically significant cardiac arrhythmia.   MOVEMENT-PARASOMNIA:  The patient had 36 leg jerks with very little clinical  impact.   IMPRESSIONS-RECOMMENDATIONS:  1.  Mild obstructive sleep apnea/hypopnea syndrome with a respiratory      disturbance index of 14 events per hour and O2 desaturation as low as      84%.  The patient did not meet split night criteria secondary to the      majority of her events occurring after 1 a.m.  Treatment for this degree      of sleep apnea should be based on its impact on the Patient's quality of      life.  Options include weight loss alone if applicable, upper airway  surgery, oral appliance, and also CPAP.  Clinical correlation is      suggested.  2.  Occasional PVC noted throughout.                                          ______________________________  Danton Sewer, M.D. [facility]  Diplomate, American Board of Sleep  Medicine   KC/MEDQ  D:  08/10/2005 14:34:19  T:  08/11/2005 07:41:09  Job:  [id number]
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
## 2010-08-11 ENCOUNTER — Ambulatory Visit (INDEPENDENT_AMBULATORY_CARE_PROVIDER_SITE_OTHER): Payer: Medicare Other | Admitting: *Deleted

## 2010-08-11 DIAGNOSIS — Z952 Presence of prosthetic heart valve: Secondary | ICD-10-CM

## 2010-08-11 DIAGNOSIS — I359 Nonrheumatic aortic valve disorder, unspecified: Secondary | ICD-10-CM

## 2010-08-11 DIAGNOSIS — I6350 Cerebral infarction due to unspecified occlusion or stenosis of unspecified cerebral artery: Secondary | ICD-10-CM

## 2010-08-11 DIAGNOSIS — I639 Cerebral infarction, unspecified: Secondary | ICD-10-CM

## 2010-08-11 LAB — POCT INR: INR: 3.7

## 2010-09-07 ENCOUNTER — Ambulatory Visit (INDEPENDENT_AMBULATORY_CARE_PROVIDER_SITE_OTHER): Payer: Medicare Other | Admitting: *Deleted

## 2010-09-07 DIAGNOSIS — Z952 Presence of prosthetic heart valve: Secondary | ICD-10-CM

## 2010-09-07 DIAGNOSIS — I359 Nonrheumatic aortic valve disorder, unspecified: Secondary | ICD-10-CM

## 2010-09-07 DIAGNOSIS — I639 Cerebral infarction, unspecified: Secondary | ICD-10-CM

## 2010-09-07 DIAGNOSIS — I6350 Cerebral infarction due to unspecified occlusion or stenosis of unspecified cerebral artery: Secondary | ICD-10-CM

## 2010-09-30 ENCOUNTER — Ambulatory Visit (INDEPENDENT_AMBULATORY_CARE_PROVIDER_SITE_OTHER): Payer: Medicare Other | Admitting: *Deleted

## 2010-09-30 DIAGNOSIS — I6350 Cerebral infarction due to unspecified occlusion or stenosis of unspecified cerebral artery: Secondary | ICD-10-CM

## 2010-09-30 DIAGNOSIS — I359 Nonrheumatic aortic valve disorder, unspecified: Secondary | ICD-10-CM

## 2010-09-30 DIAGNOSIS — Z954 Presence of other heart-valve replacement: Secondary | ICD-10-CM

## 2010-09-30 DIAGNOSIS — Z952 Presence of prosthetic heart valve: Secondary | ICD-10-CM

## 2010-09-30 DIAGNOSIS — I639 Cerebral infarction, unspecified: Secondary | ICD-10-CM

## 2010-09-30 LAB — POCT INR: INR: 2.2

## 2010-10-27 ENCOUNTER — Ambulatory Visit (INDEPENDENT_AMBULATORY_CARE_PROVIDER_SITE_OTHER): Payer: Medicare Other | Admitting: *Deleted

## 2010-10-27 DIAGNOSIS — I359 Nonrheumatic aortic valve disorder, unspecified: Secondary | ICD-10-CM

## 2010-10-27 DIAGNOSIS — Z952 Presence of prosthetic heart valve: Secondary | ICD-10-CM

## 2010-10-27 DIAGNOSIS — I639 Cerebral infarction, unspecified: Secondary | ICD-10-CM

## 2010-10-27 DIAGNOSIS — I6350 Cerebral infarction due to unspecified occlusion or stenosis of unspecified cerebral artery: Secondary | ICD-10-CM

## 2010-10-27 LAB — POCT INR: INR: 3.4

## 2010-11-24 ENCOUNTER — Ambulatory Visit (INDEPENDENT_AMBULATORY_CARE_PROVIDER_SITE_OTHER): Payer: Medicare Other | Admitting: *Deleted

## 2010-11-24 DIAGNOSIS — I359 Nonrheumatic aortic valve disorder, unspecified: Secondary | ICD-10-CM

## 2010-11-24 DIAGNOSIS — I6350 Cerebral infarction due to unspecified occlusion or stenosis of unspecified cerebral artery: Secondary | ICD-10-CM

## 2010-11-24 DIAGNOSIS — Z952 Presence of prosthetic heart valve: Secondary | ICD-10-CM

## 2010-11-24 DIAGNOSIS — I639 Cerebral infarction, unspecified: Secondary | ICD-10-CM

## 2010-12-16 LAB — CK TOTAL AND CKMB (NOT AT ARMC)
CK, MB: 0.4
Total CK: 28

## 2010-12-16 LAB — PROTIME-INR
INR: 2.4 — ABNORMAL HIGH
INR: 5.3
Prothrombin Time: 23.2 — ABNORMAL HIGH
Prothrombin Time: 27.5 — ABNORMAL HIGH
Prothrombin Time: 54.1 — ABNORMAL HIGH

## 2010-12-16 LAB — COMPREHENSIVE METABOLIC PANEL
BUN: 10
Calcium: 8.9
Creatinine, Ser: 0.95
Glucose, Bld: 128 — ABNORMAL HIGH
Total Protein: 7.5

## 2010-12-16 LAB — BASIC METABOLIC PANEL
BUN: 8
Calcium: 8.9
GFR calc non Af Amer: 58 — ABNORMAL LOW
Glucose, Bld: 149 — ABNORMAL HIGH

## 2010-12-16 LAB — URINALYSIS, ROUTINE W REFLEX MICROSCOPIC
Bilirubin Urine: NEGATIVE
Glucose, UA: NEGATIVE
Hgb urine dipstick: NEGATIVE
Nitrite: NEGATIVE
Protein, ur: NEGATIVE
Specific Gravity, Urine: 1.028
Specific Gravity, Urine: 1.025
Urobilinogen, UA: 1

## 2010-12-16 LAB — URINE MICROSCOPIC-ADD ON

## 2010-12-16 LAB — DIFFERENTIAL
Basophils Relative: 1
Lymphocytes Relative: 17
Lymphs Abs: 1.1
Monocytes Relative: 6
Neutro Abs: 4.5
Neutrophils Relative %: 69

## 2010-12-16 LAB — CBC
HCT: 34.5 — ABNORMAL LOW
Hemoglobin: 11.7 — ABNORMAL LOW
MCHC: 33.8
Platelets: 279
RDW: 14.2
RDW: 14.8

## 2010-12-16 LAB — URINE CULTURE

## 2010-12-20 LAB — PREPARE FRESH FROZEN PLASMA

## 2010-12-20 LAB — APTT
aPTT: 36
aPTT: 47 — ABNORMAL HIGH

## 2010-12-20 LAB — BASIC METABOLIC PANEL
BUN: 9
GFR calc non Af Amer: >60
Glucose, Bld: 104 — ABNORMAL HIGH
Potassium: 4.4

## 2010-12-20 LAB — CBC
HCT: 36.5
MCV: 98.5
Platelets: 311
RDW: 16.4 — ABNORMAL HIGH

## 2010-12-20 LAB — PROTIME-INR
INR: 1.6 — ABNORMAL HIGH
Prothrombin Time: 19.6 — ABNORMAL HIGH

## 2010-12-20 LAB — ABO/RH: ABO/RH(D): A POS

## 2010-12-21 LAB — DIFFERENTIAL
Eosinophils Absolute: 0.4
Eosinophils Relative: 5
Lymphocytes Relative: 19
Lymphs Abs: 1.5
Monocytes Absolute: 0.5
Monocytes Relative: 6

## 2010-12-21 LAB — CBC
MCHC: 34.3
MCV: 98.1
Platelets: 342

## 2010-12-21 LAB — POCT I-STAT, CHEM 8
BUN: 8
Calcium, Ion: 1.12
Chloride: 106
Creatinine, Ser: 0.8
TCO2: 24

## 2010-12-22 ENCOUNTER — Encounter: Payer: Medicare Other | Admitting: *Deleted

## 2010-12-26 ENCOUNTER — Ambulatory Visit (INDEPENDENT_AMBULATORY_CARE_PROVIDER_SITE_OTHER): Payer: Medicare Other | Admitting: *Deleted

## 2010-12-26 DIAGNOSIS — I639 Cerebral infarction, unspecified: Secondary | ICD-10-CM

## 2010-12-26 DIAGNOSIS — I6350 Cerebral infarction due to unspecified occlusion or stenosis of unspecified cerebral artery: Secondary | ICD-10-CM

## 2010-12-26 DIAGNOSIS — I359 Nonrheumatic aortic valve disorder, unspecified: Secondary | ICD-10-CM

## 2010-12-26 DIAGNOSIS — Z952 Presence of prosthetic heart valve: Secondary | ICD-10-CM

## 2011-01-23 ENCOUNTER — Ambulatory Visit (INDEPENDENT_AMBULATORY_CARE_PROVIDER_SITE_OTHER): Payer: Medicare Other | Admitting: *Deleted

## 2011-01-23 DIAGNOSIS — I6350 Cerebral infarction due to unspecified occlusion or stenosis of unspecified cerebral artery: Secondary | ICD-10-CM

## 2011-01-23 DIAGNOSIS — I359 Nonrheumatic aortic valve disorder, unspecified: Secondary | ICD-10-CM

## 2011-01-23 DIAGNOSIS — I639 Cerebral infarction, unspecified: Secondary | ICD-10-CM

## 2011-01-23 DIAGNOSIS — Z952 Presence of prosthetic heart valve: Secondary | ICD-10-CM

## 2011-02-08 ENCOUNTER — Telehealth: Payer: Self-pay | Admitting: Cardiology

## 2011-02-08 NOTE — Telephone Encounter (Signed)
Patient was given Warfarin in place of Coumadin.  Wants to know if it is okay for her to take this. / tg

## 2011-02-10 ENCOUNTER — Telehealth: Payer: Self-pay | Admitting: Cardiology

## 2011-02-10 NOTE — Telephone Encounter (Signed)
No answer on 11/21 or 11/23

## 2011-02-10 NOTE — Telephone Encounter (Signed)
Patient called about someone calling her.  Advised of previous message about coumadin=warfarin and advised Lattie Haw was returning her call.  Patient stated went back to pharmacy and switched her medication.

## 2011-02-16 ENCOUNTER — Encounter: Payer: Self-pay | Admitting: Cardiology

## 2011-02-20 ENCOUNTER — Ambulatory Visit (INDEPENDENT_AMBULATORY_CARE_PROVIDER_SITE_OTHER): Payer: Medicare Other | Admitting: *Deleted

## 2011-02-20 DIAGNOSIS — Z952 Presence of prosthetic heart valve: Secondary | ICD-10-CM

## 2011-02-20 DIAGNOSIS — I359 Nonrheumatic aortic valve disorder, unspecified: Secondary | ICD-10-CM

## 2011-02-20 DIAGNOSIS — I639 Cerebral infarction, unspecified: Secondary | ICD-10-CM

## 2011-02-20 DIAGNOSIS — I6350 Cerebral infarction due to unspecified occlusion or stenosis of unspecified cerebral artery: Secondary | ICD-10-CM

## 2011-03-20 ENCOUNTER — Ambulatory Visit (INDEPENDENT_AMBULATORY_CARE_PROVIDER_SITE_OTHER): Payer: Medicare Other | Admitting: *Deleted

## 2011-03-20 DIAGNOSIS — Z954 Presence of other heart-valve replacement: Secondary | ICD-10-CM

## 2011-03-20 DIAGNOSIS — I6350 Cerebral infarction due to unspecified occlusion or stenosis of unspecified cerebral artery: Secondary | ICD-10-CM

## 2011-03-20 DIAGNOSIS — I359 Nonrheumatic aortic valve disorder, unspecified: Secondary | ICD-10-CM

## 2011-03-20 LAB — POCT INR: INR: 3.2

## 2011-04-03 ENCOUNTER — Other Ambulatory Visit: Payer: Self-pay | Admitting: Cardiology

## 2011-04-03 MED ORDER — WARFARIN SODIUM 5 MG PO TABS: 5.0000 mg | ORAL_TABLET | ORAL | Status: DC

## 2011-05-01 ENCOUNTER — Ambulatory Visit (INDEPENDENT_AMBULATORY_CARE_PROVIDER_SITE_OTHER): Payer: Medicare Other | Admitting: *Deleted

## 2011-05-01 DIAGNOSIS — I359 Nonrheumatic aortic valve disorder, unspecified: Secondary | ICD-10-CM

## 2011-05-01 DIAGNOSIS — I6350 Cerebral infarction due to unspecified occlusion or stenosis of unspecified cerebral artery: Secondary | ICD-10-CM

## 2011-05-01 DIAGNOSIS — Z952 Presence of prosthetic heart valve: Secondary | ICD-10-CM

## 2011-05-01 DIAGNOSIS — Z954 Presence of other heart-valve replacement: Secondary | ICD-10-CM

## 2011-05-01 DIAGNOSIS — I639 Cerebral infarction, unspecified: Secondary | ICD-10-CM

## 2011-05-01 LAB — POCT INR: INR: 3.3

## 2011-06-12 ENCOUNTER — Ambulatory Visit (INDEPENDENT_AMBULATORY_CARE_PROVIDER_SITE_OTHER): Payer: Medicare Other | Admitting: *Deleted

## 2011-06-12 DIAGNOSIS — I639 Cerebral infarction, unspecified: Secondary | ICD-10-CM

## 2011-06-12 DIAGNOSIS — I6350 Cerebral infarction due to unspecified occlusion or stenosis of unspecified cerebral artery: Secondary | ICD-10-CM

## 2011-06-12 DIAGNOSIS — Z954 Presence of other heart-valve replacement: Secondary | ICD-10-CM

## 2011-06-12 DIAGNOSIS — Z952 Presence of prosthetic heart valve: Secondary | ICD-10-CM

## 2011-06-12 DIAGNOSIS — I359 Nonrheumatic aortic valve disorder, unspecified: Secondary | ICD-10-CM

## 2011-06-12 LAB — POCT INR: INR: 2.9

## 2011-07-24 ENCOUNTER — Ambulatory Visit (INDEPENDENT_AMBULATORY_CARE_PROVIDER_SITE_OTHER): Payer: Medicare Other | Admitting: *Deleted

## 2011-07-24 DIAGNOSIS — Z954 Presence of other heart-valve replacement: Secondary | ICD-10-CM

## 2011-07-24 DIAGNOSIS — I359 Nonrheumatic aortic valve disorder, unspecified: Secondary | ICD-10-CM

## 2011-07-24 DIAGNOSIS — I639 Cerebral infarction, unspecified: Secondary | ICD-10-CM

## 2011-07-24 DIAGNOSIS — Z952 Presence of prosthetic heart valve: Secondary | ICD-10-CM

## 2011-07-24 DIAGNOSIS — I6350 Cerebral infarction due to unspecified occlusion or stenosis of unspecified cerebral artery: Secondary | ICD-10-CM

## 2011-08-02 ENCOUNTER — Other Ambulatory Visit: Payer: Self-pay | Admitting: Cardiology

## 2011-09-04 ENCOUNTER — Ambulatory Visit (INDEPENDENT_AMBULATORY_CARE_PROVIDER_SITE_OTHER): Payer: Medicare Other | Admitting: *Deleted

## 2011-09-04 DIAGNOSIS — I6350 Cerebral infarction due to unspecified occlusion or stenosis of unspecified cerebral artery: Secondary | ICD-10-CM

## 2011-09-04 DIAGNOSIS — I639 Cerebral infarction, unspecified: Secondary | ICD-10-CM

## 2011-09-04 DIAGNOSIS — I359 Nonrheumatic aortic valve disorder, unspecified: Secondary | ICD-10-CM

## 2011-09-04 DIAGNOSIS — Z954 Presence of other heart-valve replacement: Secondary | ICD-10-CM

## 2011-09-04 DIAGNOSIS — Z952 Presence of prosthetic heart valve: Secondary | ICD-10-CM

## 2011-09-04 LAB — POCT INR: INR: 3

## 2011-10-23 ENCOUNTER — Ambulatory Visit (INDEPENDENT_AMBULATORY_CARE_PROVIDER_SITE_OTHER): Payer: Medicare Other | Admitting: *Deleted

## 2011-10-23 DIAGNOSIS — I6350 Cerebral infarction due to unspecified occlusion or stenosis of unspecified cerebral artery: Secondary | ICD-10-CM

## 2011-10-23 DIAGNOSIS — I639 Cerebral infarction, unspecified: Secondary | ICD-10-CM

## 2011-10-23 DIAGNOSIS — Z952 Presence of prosthetic heart valve: Secondary | ICD-10-CM

## 2011-10-23 DIAGNOSIS — Z954 Presence of other heart-valve replacement: Secondary | ICD-10-CM

## 2011-10-23 DIAGNOSIS — I359 Nonrheumatic aortic valve disorder, unspecified: Secondary | ICD-10-CM

## 2011-10-23 LAB — POCT INR: INR: 3.5

## 2011-11-26 ENCOUNTER — Other Ambulatory Visit: Payer: Self-pay | Admitting: Cardiology

## 2011-12-11 ENCOUNTER — Ambulatory Visit (INDEPENDENT_AMBULATORY_CARE_PROVIDER_SITE_OTHER): Payer: Medicare Other | Admitting: *Deleted

## 2011-12-11 DIAGNOSIS — I639 Cerebral infarction, unspecified: Secondary | ICD-10-CM

## 2011-12-11 DIAGNOSIS — I6350 Cerebral infarction due to unspecified occlusion or stenosis of unspecified cerebral artery: Secondary | ICD-10-CM

## 2011-12-11 DIAGNOSIS — I359 Nonrheumatic aortic valve disorder, unspecified: Secondary | ICD-10-CM

## 2011-12-11 DIAGNOSIS — Z954 Presence of other heart-valve replacement: Secondary | ICD-10-CM

## 2011-12-11 DIAGNOSIS — Z952 Presence of prosthetic heart valve: Secondary | ICD-10-CM

## 2012-01-22 ENCOUNTER — Ambulatory Visit (INDEPENDENT_AMBULATORY_CARE_PROVIDER_SITE_OTHER): Payer: Medicare Other | Admitting: *Deleted

## 2012-01-22 DIAGNOSIS — Z952 Presence of prosthetic heart valve: Secondary | ICD-10-CM

## 2012-01-22 DIAGNOSIS — I6350 Cerebral infarction due to unspecified occlusion or stenosis of unspecified cerebral artery: Secondary | ICD-10-CM

## 2012-01-22 DIAGNOSIS — I639 Cerebral infarction, unspecified: Secondary | ICD-10-CM

## 2012-01-22 DIAGNOSIS — Z954 Presence of other heart-valve replacement: Secondary | ICD-10-CM

## 2012-01-22 DIAGNOSIS — I359 Nonrheumatic aortic valve disorder, unspecified: Secondary | ICD-10-CM

## 2012-03-04 ENCOUNTER — Ambulatory Visit (INDEPENDENT_AMBULATORY_CARE_PROVIDER_SITE_OTHER): Payer: Medicare Other | Admitting: *Deleted

## 2012-03-04 DIAGNOSIS — I639 Cerebral infarction, unspecified: Secondary | ICD-10-CM

## 2012-03-04 DIAGNOSIS — I6350 Cerebral infarction due to unspecified occlusion or stenosis of unspecified cerebral artery: Secondary | ICD-10-CM

## 2012-03-04 DIAGNOSIS — Z952 Presence of prosthetic heart valve: Secondary | ICD-10-CM

## 2012-03-04 DIAGNOSIS — Z954 Presence of other heart-valve replacement: Secondary | ICD-10-CM

## 2012-03-04 DIAGNOSIS — I359 Nonrheumatic aortic valve disorder, unspecified: Secondary | ICD-10-CM

## 2012-04-15 ENCOUNTER — Ambulatory Visit (INDEPENDENT_AMBULATORY_CARE_PROVIDER_SITE_OTHER): Payer: Medicare Other | Admitting: *Deleted

## 2012-04-15 DIAGNOSIS — Z952 Presence of prosthetic heart valve: Secondary | ICD-10-CM

## 2012-04-15 DIAGNOSIS — I6350 Cerebral infarction due to unspecified occlusion or stenosis of unspecified cerebral artery: Secondary | ICD-10-CM

## 2012-04-15 DIAGNOSIS — I639 Cerebral infarction, unspecified: Secondary | ICD-10-CM

## 2012-04-15 DIAGNOSIS — I359 Nonrheumatic aortic valve disorder, unspecified: Secondary | ICD-10-CM

## 2012-04-15 DIAGNOSIS — Z954 Presence of other heart-valve replacement: Secondary | ICD-10-CM

## 2012-04-15 LAB — POCT INR: INR: 3.7

## 2012-05-27 ENCOUNTER — Ambulatory Visit (INDEPENDENT_AMBULATORY_CARE_PROVIDER_SITE_OTHER): Payer: Medicare Other | Admitting: *Deleted

## 2012-05-27 DIAGNOSIS — I639 Cerebral infarction, unspecified: Secondary | ICD-10-CM

## 2012-05-27 DIAGNOSIS — I359 Nonrheumatic aortic valve disorder, unspecified: Secondary | ICD-10-CM

## 2012-05-27 DIAGNOSIS — Z952 Presence of prosthetic heart valve: Secondary | ICD-10-CM

## 2012-05-27 DIAGNOSIS — Z954 Presence of other heart-valve replacement: Secondary | ICD-10-CM

## 2012-05-27 DIAGNOSIS — I6350 Cerebral infarction due to unspecified occlusion or stenosis of unspecified cerebral artery: Secondary | ICD-10-CM

## 2012-07-08 ENCOUNTER — Encounter: Payer: Self-pay | Admitting: *Deleted

## 2012-07-08 ENCOUNTER — Ambulatory Visit (INDEPENDENT_AMBULATORY_CARE_PROVIDER_SITE_OTHER): Payer: Medicare Other | Admitting: *Deleted

## 2012-07-08 DIAGNOSIS — I359 Nonrheumatic aortic valve disorder, unspecified: Secondary | ICD-10-CM

## 2012-07-08 DIAGNOSIS — I639 Cerebral infarction, unspecified: Secondary | ICD-10-CM

## 2012-07-08 DIAGNOSIS — Z952 Presence of prosthetic heart valve: Secondary | ICD-10-CM

## 2012-07-08 DIAGNOSIS — I6350 Cerebral infarction due to unspecified occlusion or stenosis of unspecified cerebral artery: Secondary | ICD-10-CM

## 2012-07-08 DIAGNOSIS — Z954 Presence of other heart-valve replacement: Secondary | ICD-10-CM

## 2012-07-08 LAB — POCT INR: INR: 2.7

## 2012-08-19 ENCOUNTER — Ambulatory Visit (INDEPENDENT_AMBULATORY_CARE_PROVIDER_SITE_OTHER): Payer: Medicare Other | Admitting: *Deleted

## 2012-08-19 DIAGNOSIS — I639 Cerebral infarction, unspecified: Secondary | ICD-10-CM

## 2012-08-19 DIAGNOSIS — I6350 Cerebral infarction due to unspecified occlusion or stenosis of unspecified cerebral artery: Secondary | ICD-10-CM

## 2012-08-19 DIAGNOSIS — Z954 Presence of other heart-valve replacement: Secondary | ICD-10-CM

## 2012-08-19 DIAGNOSIS — I359 Nonrheumatic aortic valve disorder, unspecified: Secondary | ICD-10-CM

## 2012-08-19 DIAGNOSIS — Z952 Presence of prosthetic heart valve: Secondary | ICD-10-CM

## 2012-08-19 LAB — POCT INR: INR: 3.7

## 2012-09-30 ENCOUNTER — Ambulatory Visit (INDEPENDENT_AMBULATORY_CARE_PROVIDER_SITE_OTHER): Payer: Medicare Other | Admitting: *Deleted

## 2012-09-30 DIAGNOSIS — Z952 Presence of prosthetic heart valve: Secondary | ICD-10-CM

## 2012-09-30 DIAGNOSIS — I359 Nonrheumatic aortic valve disorder, unspecified: Secondary | ICD-10-CM

## 2012-09-30 DIAGNOSIS — I6350 Cerebral infarction due to unspecified occlusion or stenosis of unspecified cerebral artery: Secondary | ICD-10-CM

## 2012-09-30 DIAGNOSIS — I639 Cerebral infarction, unspecified: Secondary | ICD-10-CM

## 2012-09-30 DIAGNOSIS — Z954 Presence of other heart-valve replacement: Secondary | ICD-10-CM

## 2012-09-30 LAB — POCT INR: INR: 4.2

## 2012-10-08 ENCOUNTER — Other Ambulatory Visit: Payer: Self-pay | Admitting: *Deleted

## 2012-10-08 MED ORDER — COUMADIN 5 MG PO TABS: ORAL_TABLET | ORAL | Status: DC

## 2012-10-23 ENCOUNTER — Encounter: Payer: Self-pay | Admitting: Adult Health

## 2012-10-23 ENCOUNTER — Ambulatory Visit (INDEPENDENT_AMBULATORY_CARE_PROVIDER_SITE_OTHER): Payer: Medicare Other | Admitting: Adult Health

## 2012-10-23 VITALS — BP 172/86 | HR 74 | Ht 67.0 in | Wt 209.0 lb

## 2012-10-23 DIAGNOSIS — E1169 Type 2 diabetes mellitus with other specified complication: Secondary | ICD-10-CM | POA: Insufficient documentation

## 2012-10-23 DIAGNOSIS — I10 Essential (primary) hypertension: Secondary | ICD-10-CM

## 2012-10-23 DIAGNOSIS — E119 Type 2 diabetes mellitus without complications: Secondary | ICD-10-CM

## 2012-10-23 DIAGNOSIS — Z954 Presence of other heart-valve replacement: Secondary | ICD-10-CM

## 2012-10-23 DIAGNOSIS — Z952 Presence of prosthetic heart valve: Secondary | ICD-10-CM

## 2012-10-23 MED ORDER — LISINOPRIL 5 MG PO TABS: 5.0000 mg | ORAL_TABLET | Freq: Every day | ORAL | Status: DC

## 2012-10-23 NOTE — Progress Notes (Deleted)
Name: Mary [name]    DOB: 1940/08/01  Age: 72 y.o.  MR#: [id number]       PCP:  Robert Bellow, MD      Insurance: Payor: [place] / Plan: BLUE MEDICARE / Product Type: *No Product type* /   CC:   No chief complaint on file.   VS Filed Vitals:   10/23/12 1337  BP: 172/86  Pulse: 74  Height: 5\' 7"  (1.702 m)  Weight: 209 lb (94.802 kg)    Weights Current Weight  10/23/12 209 lb (94.802 kg)    Blood Pressure  BP Readings from Last 3 Encounters:  10/23/12 172/86     Admit date:  (Not on file) Last encounter with RMR:  Visit date not found   Allergy Review of patient's allergies indicates not on file.  Current Outpatient Prescriptions  Medication Sig Dispense Refill  . ALPRAZolam (XANAX) 0.5 MG tablet Take 0.5 mg by mouth.        . clindamycin (CLEOCIN) 150 MG capsule       . COUMADIN 5 MG tablet TAKE 1 TABLET (5 MG TOTAL) BY MOUTH AS DIRECTED.  30 tablet  3  . fluconazole (DIFLUCAN) 100 MG tablet       . HYDROcodone-acetaminophen (LORTAB) 7.5-500 MG per tablet Take 1 tablet by mouth.        . metFORMIN (GLUCOPHAGE) 500 MG tablet       . metoprolol (TOPROL-XL) 50 MG 24 hr tablet Take 50 mg by mouth.        Marland Kitchen omeprazole (PRILOSEC) 20 MG capsule Take 20 mg by mouth.        . simvastatin (ZOCOR) 40 MG tablet        No current facility-administered medications for this visit.    Discontinued Meds:    Medications Discontinued During This Encounter  Medication Reason  . ezetimibe-simvastatin (VYTORIN) 10-80 MG per tablet   . fenofibrate (TRICOR) 145 MG tablet   . escitalopram (LEXAPRO) 20 MG tablet     Patient Active Problem List   Diagnosis Date Noted  . Aortic valve disorder 06/30/2010  . CVA (cerebral vascular accident) 06/30/2010  . S/P aortic valve replacement 06/30/2010  . HYPERLIPIDEMIA 09/29/2008  . OVERWEIGHT 09/29/2008  . HYPERTENSION 09/29/2008  . DEGENERATIVE JOINT DISEASE 09/29/2008    LABS    Component Value  Date/Time   NA 136 12/26/2007 0959   NA 138 11/25/2007 1408   NA 135 11/01/2007 1525   K 4.4 12/26/2007 0959   K 4.5 11/25/2007 1408   K 4.4 11/01/2007 1525   CL 105 12/26/2007 0959   CL 106 11/25/2007 1408   CL 103 11/01/2007 1525   CO2 23 12/26/2007 0959   CO2 26 11/01/2007 1525   CO2 26 10/03/2007 1358   GLUCOSE 104* 12/26/2007 0959   GLUCOSE 98 11/25/2007 1408   GLUCOSE 128* 11/01/2007 1525   BUN 9 12/26/2007 0959   BUN 8 11/25/2007 1408   BUN 10 11/01/2007 1525   CREATININE 0.74 12/26/2007 0959   CREATININE 0.8 11/25/2007 1408   CREATININE 0.95 11/01/2007 1525   CALCIUM 9.2 12/26/2007 0959   CALCIUM 8.9 11/01/2007 1525   CALCIUM 8.9 10/03/2007 1358   GFRNONAA >60 12/26/2007 0959   GFRNONAA 58* 11/01/2007 1525   GFRNONAA 58* 10/03/2007 1358   GFRAA  Value: >60        The eGFR has been calculated using the MDRD equation. This calculation has not been validated  in all clinical 12/26/2007 0959   GFRAA  Value: >60        The eGFR has been calculated using the MDRD equation. This calculation has not been validated in all clinical 11/01/2007 1525   GFRAA  Value: >60        The eGFR has been calculated using the MDRD equation. This calculation has not been validated in all clinical 10/03/2007 1358   CMP     Component Value Date/Time   NA 136 12/26/2007 0959   K 4.4 12/26/2007 0959   CL 105 12/26/2007 0959   CO2 23 12/26/2007 0959   GLUCOSE 104* 12/26/2007 0959   BUN 9 12/26/2007 0959   CREATININE 0.74 12/26/2007 0959   CALCIUM 9.2 12/26/2007 0959   PROT 7.5 11/01/2007 1525   ALBUMIN 2.8* 11/01/2007 1525   AST 31 11/01/2007 1525   ALT 19 11/01/2007 1525   ALKPHOS 66 11/01/2007 1525   BILITOT 0.7 11/01/2007 1525   GFRNONAA >60 12/26/2007 0959   GFRAA  Value: >60        The eGFR has been calculated using the MDRD equation. This calculation has not been validated in all clinical 12/26/2007 0959       Component Value Date/Time   WBC 7.8 12/26/2007 0959   WBC 8.1 11/25/2007 1400   WBC 6.4 11/01/2007 1525   HGB 12.2  12/26/2007 0959   HGB 12.9 11/25/2007 1408   HGB 12.1 11/25/2007 1400   HCT 36.5 12/26/2007 0959   HCT 38.0 11/25/2007 1408   HCT 35.3* 11/25/2007 1400   MCV 98.5 12/26/2007 0959   MCV 98.1 11/25/2007 1400   MCV 98.3 11/01/2007 1525    Lipid Panel  No results found for this basename: chol, trig, hdl, cholhdl, vldl, ldlcalc    ABG    Component Value Date/Time   TCO2 24 11/25/2007 1408     No results found for this basename: TSH   BNP (last 3 results) No results found for this basename: PROBNP,  in the last 8760 hours Cardiac Panel (last 3 results) No results found for this basename: CKTOTAL, CKMB, TROPONINI, RELINDX,  in the last 72 hours  Iron/TIBC/Ferritin No results found for this basename: iron, tibc, ferritin     EKG No orders found for this or any previous visit.   Prior Assessment and Plan Problem List as of 10/23/2012     Cardiovascular and Mediastinum   HYPERTENSION   Aortic valve disorder   CVA (cerebral vascular accident)   S/P aortic valve replacement     Musculoskeletal and Integument   DEGENERATIVE JOINT DISEASE     Other   HYPERLIPIDEMIA   OVERWEIGHT       Imaging: No results found.

## 2012-10-23 NOTE — Assessment & Plan Note (Signed)
Doing well. Will continue coumadin therapy and BB therapy. No change to medication regimen.

## 2012-10-23 NOTE — Assessment & Plan Note (Signed)
Blood pressure is not well controlled at present in patient with Diabetes. I have rechecked it in the exam room finding it to be 158/88.  I will start her on lisinopril 5 mg daily. Request labs from Dr. Karie Kirks for kidney fx with repeat evaluation of kidney fx on next visit. She will have BP checked on next coumadin clinic visit, and see her in one month.

## 2012-10-23 NOTE — Patient Instructions (Addendum)
Your physician recommends that you schedule a follow-up appointment in: [place] VISIT TO BE SCHEDULED AT NEXT COUMADIN VISIT  Your physician has recommended you make the following change in your medication:   1) START TAKING LISINOPRIL 5MG  ONCE DAILY

## 2012-10-23 NOTE — Progress Notes (Signed)
   HPI: [name] is a 72 year old patient of Dr. Lattie Haw we are following for ongoing assessment and management of aortic valve replacement, hypertension, hyperlipidemia, and CVA. The patient is on chronic Coumadin therapy. She is here for annual evaluation.She comes today with newly diagnosed diabetes and has recently been placed on metformin by Dr. Karie Kirks. She complains of fatigue, elevated BP, and pressure in her head and chest at times. She denies frank chest pain, dizziness or palpitations. Dr. Karie Kirks has drawn labs this week.   Not on File  Current Outpatient Prescriptions  Medication Sig Dispense Refill  . ALPRAZolam (XANAX) 0.5 MG tablet Take 0.5 mg by mouth.        . clindamycin (CLEOCIN) 150 MG capsule       . COUMADIN 5 MG tablet TAKE 1 TABLET (5 MG TOTAL) BY MOUTH AS DIRECTED.  30 tablet  3  . fluconazole (DIFLUCAN) 100 MG tablet       . HYDROcodone-acetaminophen (LORTAB) 7.5-500 MG per tablet Take 1 tablet by mouth.        Marland Kitchen lisinopril (PRINIVIL,ZESTRIL) 5 MG tablet Take 1 tablet (5 mg total) by mouth daily.  90 tablet  1  . metFORMIN (GLUCOPHAGE) 500 MG tablet       . metoprolol (TOPROL-XL) 50 MG 24 hr tablet Take 50 mg by mouth.        Marland Kitchen omeprazole (PRILOSEC) 20 MG capsule Take 20 mg by mouth.        . simvastatin (ZOCOR) 40 MG tablet        No current facility-administered medications for this visit.    Past Medical History  Diagnosis Date  . DJD (degenerative joint disease)   . Overweight(278.02)   . Hyperlipidemia   . Hypertension     Past Surgical History  Procedure Laterality Date  . Ankle surgery      Left tendon repair  . Aortic valve replacement  03/1994  . Lumbar fusion    . Cervical spine surgery    . Hemorrhoid surgery      [id number] of systems complete and found to be negative unless listed above  PHYSICAL EXAM BP 172/86  Pulse 74  Ht 5\' 7"  (1.702 m)  Wt 209 lb (94.802 kg)  BMI 32.73 kg/m2  General: Well developed, well nourished,  obese, in no acute distress Head: Eyes PERRLA, No xanthomas.   Normal cephalic and atramatic  Lungs: Clear bilaterally to auscultation and percussion. Heart: HRRR S1 S2, with soft S4, and 1/6 systolic murmur.  Pulses are 2+ & equal.            No carotid bruit. No JVD. Abdomen: Bowel sounds are positive, abdomen soft and non-tender without masses or                  Hernia's noted. Msk:  Back normal, normal gait. Normal strength and tone for age. Extremities: No clubbing, cyanosis or edema.  DP +1 Neuro: Alert and oriented X 3. Psych:  Good affect, responds appropriately  EKG:NSR with 1st degree AV block.  ASSESSMENT AND PLAN

## 2012-10-25 ENCOUNTER — Encounter: Payer: Self-pay | Admitting: Adult Health

## 2012-10-28 ENCOUNTER — Ambulatory Visit (INDEPENDENT_AMBULATORY_CARE_PROVIDER_SITE_OTHER): Payer: Self-pay | Admitting: *Deleted

## 2012-10-28 ENCOUNTER — Ambulatory Visit (INDEPENDENT_AMBULATORY_CARE_PROVIDER_SITE_OTHER): Payer: Medicare Other | Admitting: *Deleted

## 2012-10-28 VITALS — BP 148/73 | HR 70 | Ht 67.0 in | Wt 210.2 lb

## 2012-10-28 DIAGNOSIS — Z954 Presence of other heart-valve replacement: Secondary | ICD-10-CM

## 2012-10-28 DIAGNOSIS — I6350 Cerebral infarction due to unspecified occlusion or stenosis of unspecified cerebral artery: Secondary | ICD-10-CM

## 2012-10-28 DIAGNOSIS — R0989 Other specified symptoms and signs involving the circulatory and respiratory systems: Secondary | ICD-10-CM

## 2012-10-28 DIAGNOSIS — Z952 Presence of prosthetic heart valve: Secondary | ICD-10-CM

## 2012-10-28 DIAGNOSIS — I359 Nonrheumatic aortic valve disorder, unspecified: Secondary | ICD-10-CM

## 2012-10-28 DIAGNOSIS — I639 Cerebral infarction, unspecified: Secondary | ICD-10-CM

## 2012-10-28 DIAGNOSIS — I10 Essential (primary) hypertension: Secondary | ICD-10-CM

## 2012-10-28 NOTE — Patient Instructions (Addendum)
Your physician recommends that you schedule a follow-up appointment in: TO BE DETERMINED   

## 2012-10-28 NOTE — Progress Notes (Signed)
Pt presented to nurse visit today PER HTN AND RECENT MEDICATION CHANGES Pt voiced no complaints today Please advise if any further instructions are neccessary   Joelene Millin A. Heiley Shaikh L.P.N.  Last OV D/C Summary and VS:  BP 172/86  Pulse 74  Ht 5\' 7"  (1.702 m)  Wt 209 lb (94.802 kg)  BMI 32.73 kg/m2   Your physician recommends that you schedule a follow-up appointment in: [place] VISIT TO BE SCHEDULED AT NEXT COUMADIN VISIT  Your physician has recommended you make the following change in your medication:  1) START TAKING LISINOPRIL 5MG  ONCE DAILY

## 2012-11-20 ENCOUNTER — Ambulatory Visit (INDEPENDENT_AMBULATORY_CARE_PROVIDER_SITE_OTHER): Payer: Medicare Other | Admitting: *Deleted

## 2012-11-20 ENCOUNTER — Telehealth: Payer: Self-pay | Admitting: *Deleted

## 2012-11-20 DIAGNOSIS — I6350 Cerebral infarction due to unspecified occlusion or stenosis of unspecified cerebral artery: Secondary | ICD-10-CM

## 2012-11-20 DIAGNOSIS — Z952 Presence of prosthetic heart valve: Secondary | ICD-10-CM

## 2012-11-20 DIAGNOSIS — Z954 Presence of other heart-valve replacement: Secondary | ICD-10-CM

## 2012-11-20 DIAGNOSIS — I639 Cerebral infarction, unspecified: Secondary | ICD-10-CM

## 2012-11-20 DIAGNOSIS — I359 Nonrheumatic aortic valve disorder, unspecified: Secondary | ICD-10-CM

## 2012-11-20 LAB — POCT INR: INR: 2.2

## 2012-11-20 MED ORDER — WARFARIN SODIUM 5 MG PO TABS: ORAL_TABLET | ORAL | Status: DC

## 2012-11-20 NOTE — Telephone Encounter (Signed)
Called.  Questions answered.   

## 2012-11-20 NOTE — Telephone Encounter (Signed)
Patient would like return phone call regarding new RX for Warfarin/tgs

## 2012-11-26 NOTE — Progress Notes (Signed)
Please advise 

## 2013-01-01 ENCOUNTER — Ambulatory Visit (INDEPENDENT_AMBULATORY_CARE_PROVIDER_SITE_OTHER): Payer: Medicare Other | Admitting: Adult Health

## 2013-01-01 ENCOUNTER — Encounter: Payer: Self-pay | Admitting: Adult Health

## 2013-01-01 ENCOUNTER — Ambulatory Visit (INDEPENDENT_AMBULATORY_CARE_PROVIDER_SITE_OTHER): Payer: Medicare Other | Admitting: *Deleted

## 2013-01-01 VITALS — BP 189/76 | HR 67 | Ht 67.0 in | Wt 214.0 lb

## 2013-01-01 DIAGNOSIS — E663 Overweight: Secondary | ICD-10-CM

## 2013-01-01 DIAGNOSIS — Z954 Presence of other heart-valve replacement: Secondary | ICD-10-CM

## 2013-01-01 DIAGNOSIS — E785 Hyperlipidemia, unspecified: Secondary | ICD-10-CM

## 2013-01-01 DIAGNOSIS — I6350 Cerebral infarction due to unspecified occlusion or stenosis of unspecified cerebral artery: Secondary | ICD-10-CM

## 2013-01-01 DIAGNOSIS — I639 Cerebral infarction, unspecified: Secondary | ICD-10-CM

## 2013-01-01 DIAGNOSIS — I359 Nonrheumatic aortic valve disorder, unspecified: Secondary | ICD-10-CM

## 2013-01-01 DIAGNOSIS — G4733 Obstructive sleep apnea (adult) (pediatric): Secondary | ICD-10-CM

## 2013-01-01 DIAGNOSIS — Z952 Presence of prosthetic heart valve: Secondary | ICD-10-CM

## 2013-01-01 DIAGNOSIS — I10 Essential (primary) hypertension: Secondary | ICD-10-CM

## 2013-01-01 LAB — POCT INR: INR: 1.9

## 2013-01-01 NOTE — Assessment & Plan Note (Signed)
Blood pressure is elevated on this visit. She has not been using CPAP as directed as she states that she has to have it repaired or replaced. I rechecked her blood pressure here in the exam room and found it to be 148/88 after her resting. I will continue her on her current medication regimen as directed to include lisinopril 5 mg daily.

## 2013-01-01 NOTE — Assessment & Plan Note (Addendum)
She was diagnosed with this in 2007 for abnormal sleep study. She is not using her CPAP as directed as her machine is in need of repair. I have printed out a copy of her sleep study in order to have Dr. Karie Kirks see  this and order another machine. She stated she needed a copy of this report in order to move forward with this.

## 2013-01-01 NOTE — Assessment & Plan Note (Addendum)
She will continue on metoprolol 50 mg XL daily. She is also to continue Coumadin treatment. She will be seen by our Coumadin clinic nurse today prior to her leaving the office. She will be seen again in 6 months. Which time we will repeat her echocardiogram for evaluation of the aortic valve status status post repair.

## 2013-01-01 NOTE — Progress Notes (Signed)
    HPI: Mary [name] is a 72 year old former patient of Dr. Lattie Haw report for ongoing assessment and management of aortic valve replacement, hypertension, hyperlipidemia, with a history of CVA. The patient is on chronic Coumadin therapy. She was last seen in the office in August of 2014. At that time her blood pressure was not well controlled. The patient was started on lisinopril 5 mg daily. She is here for evaluation of patient's response to medication.    She comes today slightly anxious. BP is elevated. She has not been on her CPAP because "my machine is broken." Needs another one ordered, She is medically complaint.     No Known Allergies  Current Outpatient Prescriptions  Medication Sig Dispense Refill  . ALPRAZolam (XANAX) 0.5 MG tablet Take 0.5 mg by mouth.        Marland Kitchen glimepiride (AMARYL) 4 MG tablet       . HYDROcodone-acetaminophen (LORTAB) 7.5-500 MG per tablet Take 1 tablet by mouth.        Marland Kitchen lisinopril (PRINIVIL,ZESTRIL) 5 MG tablet Take 1 tablet (5 mg total) by mouth daily.  90 tablet  1  . metFORMIN (GLUCOPHAGE) 500 MG tablet       . metoprolol (TOPROL-XL) 50 MG 24 hr tablet Take 50 mg by mouth.        Marland Kitchen omeprazole (PRILOSEC) 20 MG capsule Take 20 mg by mouth.        . simvastatin (ZOCOR) 40 MG tablet       . warfarin (COUMADIN) 5 MG tablet Take 1/2 tablet daily but 1 tablet on Friday  30 tablet  6   No current facility-administered medications for this visit.    Past Medical History  Diagnosis Date  . DJD (degenerative joint disease)   . Overweight(278.02)   . Hyperlipidemia   . Hypertension     Past Surgical History  Procedure Laterality Date  . Ankle surgery      Left tendon repair  . Aortic valve replacement  03/1994  . Lumbar fusion    . Cervical spine surgery    . Hemorrhoid surgery      ROS: Review of systems complete and found to be negative unless listed above PHYSICAL EXAM BP 189/76  Pulse 67  Ht 5\' 7"  (1.702 m)  Wt 214 lb (97.07 kg)  BMI 33.51  kg/m2  General: Well developed, well nourished, in no acute distress Head: Eyes PERRLA, No xanthomas.   Normal cephalic and atramatic  Lungs: Clear bilaterally to auscultation and percussion. Heart: HRRR S1 S2, without MRG.  Pulses are 2+ & equal.            No carotid bruit. No JVD.  No abdominal bruits. No femoral bruits. Abdomen: Bowel sounds are positive, abdomen soft and non-tender without masses or                  Hernia's noted. Msk:  Back normal, normal gait. Normal strength and tone for age. Extremities: No clubbing, cyanosis or edema.  DP +1 Neuro: Alert and oriented X 3. Psych:  Good affect, responds appropriately    ASSESSMENT AND PLAN

## 2013-01-01 NOTE — Assessment & Plan Note (Signed)
The patient's BMI is 33.5. She is advised on increasing her exercise, and calorie reduction. As this is a significant cardiovascular risk factor.

## 2013-01-01 NOTE — Assessment & Plan Note (Signed)
She is followed by Dr. Karie Kirks for labs. She will continue on Zocor 40 mg daily.

## 2013-01-01 NOTE — Patient Instructions (Signed)
Your physician recommends that you schedule a follow-up appointment in: 6 months You will receive a reminder letter two months in advance reminding you to call and schedule your appointment. If you don't receive this letter, please contact our office.  Your physician recommends that you continue on your current medications as directed. Please refer to the Current Medication list given to you today.     

## 2013-01-01 NOTE — Progress Notes (Deleted)
Name: Mary [name]    DOB: 05-16-1940  Age: 72 y.o.  MR#: [id number]       PCP:  Robert Bellow, MD      Insurance: Payor: [place] / Plan: BLUE MEDICARE / Product Type: *No Product type* /   CC:    Chief Complaint  Patient presents with  . Hypertension  . Aortic Stenosis    S/P Replacement    VS Filed Vitals:   01/01/13 1441  BP: 189/76  Pulse: 67  Height: 5\' 7"  (1.702 m)  Weight: 214 lb (97.07 kg)    Weights Current Weight  01/01/13 214 lb (97.07 kg)  10/28/12 210 lb 4 oz (95.369 kg)  10/23/12 209 lb (94.802 kg)    Blood Pressure  BP Readings from Last 3 Encounters:  01/01/13 189/76  10/28/12 148/73  10/23/12 172/86     Admit date:  (Not on file) Last encounter with RMR:  10/23/2012   Allergy Review of patient's allergies indicates no known allergies.  Current Outpatient Prescriptions  Medication Sig Dispense Refill  . ALPRAZolam (XANAX) 0.5 MG tablet Take 0.5 mg by mouth.        Marland Kitchen glimepiride (AMARYL) 4 MG tablet       . HYDROcodone-acetaminophen (LORTAB) 7.5-500 MG per tablet Take 1 tablet by mouth.        Marland Kitchen lisinopril (PRINIVIL,ZESTRIL) 5 MG tablet Take 1 tablet (5 mg total) by mouth daily.  90 tablet  1  . metFORMIN (GLUCOPHAGE) 500 MG tablet       . metoprolol (TOPROL-XL) 50 MG 24 hr tablet Take 50 mg by mouth.        Marland Kitchen omeprazole (PRILOSEC) 20 MG capsule Take 20 mg by mouth.        . simvastatin (ZOCOR) 40 MG tablet       . warfarin (COUMADIN) 5 MG tablet Take 1/2 tablet daily but 1 tablet on Friday  30 tablet  6   No current facility-administered medications for this visit.    Discontinued Meds:   There are no discontinued medications.  Patient Active Problem List   Diagnosis Date Noted  . Diabetes 10/23/2012  . Aortic valve disorder 06/30/2010  . CVA (cerebral vascular accident) 06/30/2010  . S/P aortic valve replacement 06/30/2010  . HYPERLIPIDEMIA 09/29/2008  . OVERWEIGHT 09/29/2008  . HYPERTENSION 09/29/2008  .  DEGENERATIVE JOINT DISEASE 09/29/2008    LABS    Component Value Date/Time   NA 136 12/26/2007 0959   NA 138 11/25/2007 1408   NA 135 11/01/2007 1525   K 4.4 12/26/2007 0959   K 4.5 11/25/2007 1408   K 4.4 11/01/2007 1525   CL 105 12/26/2007 0959   CL 106 11/25/2007 1408   CL 103 11/01/2007 1525   CO2 23 12/26/2007 0959   CO2 26 11/01/2007 1525   CO2 26 10/03/2007 1358   GLUCOSE 104* 12/26/2007 0959   GLUCOSE 98 11/25/2007 1408   GLUCOSE 128* 11/01/2007 1525   BUN 9 12/26/2007 0959   BUN 8 11/25/2007 1408   BUN 10 11/01/2007 1525   CREATININE 0.74 12/26/2007 0959   CREATININE 0.8 11/25/2007 1408   CREATININE 0.95 11/01/2007 1525   CALCIUM 9.2 12/26/2007 0959   CALCIUM 8.9 11/01/2007 1525   CALCIUM 8.9 10/03/2007 1358   GFRNONAA >60 12/26/2007 0959   GFRNONAA 58* 11/01/2007 1525   GFRNONAA 58* 10/03/2007 1358   GFRAA  Value: >60        The  eGFR has been calculated using the MDRD equation. This calculation has not been validated in all clinical 12/26/2007 0959   GFRAA  Value: >60        The eGFR has been calculated using the MDRD equation. This calculation has not been validated in all clinical 11/01/2007 1525   GFRAA  Value: >60        The eGFR has been calculated using the MDRD equation. This calculation has not been validated in all clinical 10/03/2007 1358   CMP     Component Value Date/Time   NA 136 12/26/2007 0959   K 4.4 12/26/2007 0959   CL 105 12/26/2007 0959   CO2 23 12/26/2007 0959   GLUCOSE 104* 12/26/2007 0959   BUN 9 12/26/2007 0959   CREATININE 0.74 12/26/2007 0959   CALCIUM 9.2 12/26/2007 0959   PROT 7.5 11/01/2007 1525   ALBUMIN 2.8* 11/01/2007 1525   AST 31 11/01/2007 1525   ALT 19 11/01/2007 1525   ALKPHOS 66 11/01/2007 1525   BILITOT 0.7 11/01/2007 1525   GFRNONAA >60 12/26/2007 0959   GFRAA  Value: >60        The eGFR has been calculated using the MDRD equation. This calculation has not been validated in all clinical 12/26/2007 0959       Component Value Date/Time   WBC 7.8 12/26/2007 0959    WBC 8.1 11/25/2007 1400   WBC 6.4 11/01/2007 1525   HGB 12.2 12/26/2007 0959   HGB 12.9 11/25/2007 1408   HGB 12.1 11/25/2007 1400   HCT 36.5 12/26/2007 0959   HCT 38.0 11/25/2007 1408   HCT 35.3* 11/25/2007 1400   MCV 98.5 12/26/2007 0959   MCV 98.1 11/25/2007 1400   MCV 98.3 11/01/2007 1525    Lipid Panel  No results found for this basename: chol, trig, hdl, cholhdl, vldl, ldlcalc    ABG    Component Value Date/Time   TCO2 24 11/25/2007 1408     No results found for this basename: TSH   BNP (last 3 results) No results found for this basename: PROBNP,  in the last 8760 hours Cardiac Panel (last 3 results) No results found for this basename: CKTOTAL, CKMB, TROPONINI, RELINDX,  in the last 72 hours  Iron/TIBC/Ferritin No results found for this basename: iron, tibc, ferritin     EKG Orders placed in visit on 10/23/12  . EKG 12-LEAD     Prior Assessment and Plan Problem List as of 01/01/2013     Cardiovascular and Mediastinum   HYPERTENSION   Last Assessment & Plan   10/23/2012 Office Visit Written 10/23/2012  2:22 PM by Lendon Colonel, NP     Blood pressure is not well controlled at present in patient with Diabetes. I have rechecked it in the exam room finding it to be 158/88.  I will start her on lisinopril 5 mg daily. Request labs from Dr. Karie Kirks for kidney fx with repeat evaluation of kidney fx on next visit. She will have BP checked on next coumadin clinic visit, and see her in one month.    Aortic valve disorder   CVA (cerebral vascular accident)   S/P aortic valve replacement   Last Assessment & Plan   10/23/2012 Office Visit Written 10/23/2012  2:24 PM by Lendon Colonel, NP     Doing well. Will continue coumadin therapy and BB therapy. No change to medication regimen.      Endocrine   Diabetes     Musculoskeletal and Integument  DEGENERATIVE JOINT DISEASE     Other   HYPERLIPIDEMIA   OVERWEIGHT       Imaging: No results found.

## 2013-01-15 ENCOUNTER — Ambulatory Visit (INDEPENDENT_AMBULATORY_CARE_PROVIDER_SITE_OTHER): Payer: Medicare Other | Admitting: *Deleted

## 2013-01-15 DIAGNOSIS — I6350 Cerebral infarction due to unspecified occlusion or stenosis of unspecified cerebral artery: Secondary | ICD-10-CM

## 2013-01-15 DIAGNOSIS — Z952 Presence of prosthetic heart valve: Secondary | ICD-10-CM

## 2013-01-15 DIAGNOSIS — I639 Cerebral infarction, unspecified: Secondary | ICD-10-CM

## 2013-01-15 DIAGNOSIS — I359 Nonrheumatic aortic valve disorder, unspecified: Secondary | ICD-10-CM

## 2013-01-15 DIAGNOSIS — Z954 Presence of other heart-valve replacement: Secondary | ICD-10-CM

## 2013-02-05 ENCOUNTER — Ambulatory Visit (INDEPENDENT_AMBULATORY_CARE_PROVIDER_SITE_OTHER): Payer: Medicare Other | Admitting: *Deleted

## 2013-02-05 DIAGNOSIS — Z954 Presence of other heart-valve replacement: Secondary | ICD-10-CM

## 2013-02-05 DIAGNOSIS — I6350 Cerebral infarction due to unspecified occlusion or stenosis of unspecified cerebral artery: Secondary | ICD-10-CM

## 2013-02-05 DIAGNOSIS — Z952 Presence of prosthetic heart valve: Secondary | ICD-10-CM

## 2013-02-05 DIAGNOSIS — I359 Nonrheumatic aortic valve disorder, unspecified: Secondary | ICD-10-CM

## 2013-02-05 DIAGNOSIS — I639 Cerebral infarction, unspecified: Secondary | ICD-10-CM

## 2013-02-10 ENCOUNTER — Other Ambulatory Visit: Payer: Self-pay | Admitting: *Deleted

## 2013-02-10 MED ORDER — WARFARIN SODIUM 5 MG PO TABS: ORAL_TABLET | ORAL | Status: DC

## 2013-02-12 ENCOUNTER — Other Ambulatory Visit: Payer: Self-pay | Admitting: *Deleted

## 2013-02-12 MED ORDER — WARFARIN SODIUM 5 MG PO TABS: ORAL_TABLET | ORAL | Status: DC

## 2013-03-05 ENCOUNTER — Ambulatory Visit (INDEPENDENT_AMBULATORY_CARE_PROVIDER_SITE_OTHER): Payer: Medicare Other | Admitting: *Deleted

## 2013-03-05 DIAGNOSIS — I6350 Cerebral infarction due to unspecified occlusion or stenosis of unspecified cerebral artery: Secondary | ICD-10-CM

## 2013-03-05 DIAGNOSIS — I639 Cerebral infarction, unspecified: Secondary | ICD-10-CM

## 2013-03-05 DIAGNOSIS — I359 Nonrheumatic aortic valve disorder, unspecified: Secondary | ICD-10-CM

## 2013-03-05 DIAGNOSIS — Z954 Presence of other heart-valve replacement: Secondary | ICD-10-CM

## 2013-03-05 DIAGNOSIS — Z952 Presence of prosthetic heart valve: Secondary | ICD-10-CM

## 2013-03-05 LAB — POCT INR: INR: 3.3

## 2013-04-03 ENCOUNTER — Ambulatory Visit (INDEPENDENT_AMBULATORY_CARE_PROVIDER_SITE_OTHER): Payer: Medicare Other | Admitting: *Deleted

## 2013-04-03 DIAGNOSIS — Z952 Presence of prosthetic heart valve: Secondary | ICD-10-CM

## 2013-04-03 DIAGNOSIS — I6350 Cerebral infarction due to unspecified occlusion or stenosis of unspecified cerebral artery: Secondary | ICD-10-CM

## 2013-04-03 DIAGNOSIS — Z954 Presence of other heart-valve replacement: Secondary | ICD-10-CM

## 2013-04-03 DIAGNOSIS — I639 Cerebral infarction, unspecified: Secondary | ICD-10-CM

## 2013-04-03 DIAGNOSIS — I359 Nonrheumatic aortic valve disorder, unspecified: Secondary | ICD-10-CM

## 2013-04-03 LAB — POCT INR: INR: 3.2

## 2013-04-14 ENCOUNTER — Other Ambulatory Visit: Payer: Self-pay | Admitting: Adult Health

## 2013-05-26 ENCOUNTER — Ambulatory Visit (INDEPENDENT_AMBULATORY_CARE_PROVIDER_SITE_OTHER): Payer: Medicare Other | Admitting: *Deleted

## 2013-05-26 DIAGNOSIS — I359 Nonrheumatic aortic valve disorder, unspecified: Secondary | ICD-10-CM

## 2013-05-26 DIAGNOSIS — I6350 Cerebral infarction due to unspecified occlusion or stenosis of unspecified cerebral artery: Secondary | ICD-10-CM

## 2013-05-26 DIAGNOSIS — Z952 Presence of prosthetic heart valve: Secondary | ICD-10-CM

## 2013-05-26 DIAGNOSIS — I639 Cerebral infarction, unspecified: Secondary | ICD-10-CM

## 2013-05-26 DIAGNOSIS — Z954 Presence of other heart-valve replacement: Secondary | ICD-10-CM

## 2013-05-26 LAB — POCT INR: INR: 3.4

## 2013-06-09 ENCOUNTER — Other Ambulatory Visit (HOSPITAL_COMMUNITY): Payer: Self-pay | Admitting: Family Medicine

## 2013-06-09 ENCOUNTER — Ambulatory Visit (HOSPITAL_COMMUNITY)
Admission: RE | Admit: 2013-06-09 | Discharge: 2013-06-09 | Disposition: A | Discharge status: Home / Self Care | Payer: Medicare Other | Source: Ambulatory Visit | Attending: Family Medicine | Admitting: Family Medicine

## 2013-06-09 DIAGNOSIS — M542 Cervicalgia: Secondary | ICD-10-CM

## 2013-06-09 DIAGNOSIS — M25519 Pain in unspecified shoulder: Secondary | ICD-10-CM

## 2013-06-09 DIAGNOSIS — Z981 Arthrodesis status: Secondary | ICD-10-CM | POA: Insufficient documentation

## 2013-06-09 DIAGNOSIS — M5380 Other specified dorsopathies, site unspecified: Secondary | ICD-10-CM | POA: Insufficient documentation

## 2013-07-07 ENCOUNTER — Ambulatory Visit (INDEPENDENT_AMBULATORY_CARE_PROVIDER_SITE_OTHER): Payer: Medicare Other | Admitting: *Deleted

## 2013-07-07 DIAGNOSIS — I6350 Cerebral infarction due to unspecified occlusion or stenosis of unspecified cerebral artery: Secondary | ICD-10-CM

## 2013-07-07 DIAGNOSIS — Z954 Presence of other heart-valve replacement: Secondary | ICD-10-CM

## 2013-07-07 DIAGNOSIS — I359 Nonrheumatic aortic valve disorder, unspecified: Secondary | ICD-10-CM

## 2013-07-07 DIAGNOSIS — I639 Cerebral infarction, unspecified: Secondary | ICD-10-CM

## 2013-07-07 DIAGNOSIS — Z952 Presence of prosthetic heart valve: Secondary | ICD-10-CM

## 2013-07-07 LAB — POCT INR: INR: 3.6

## 2013-07-21 ENCOUNTER — Ambulatory Visit (HOSPITAL_COMMUNITY)
Admission: RE | Admit: 2013-07-21 | Discharge: 2013-07-21 | Disposition: A | Discharge status: Home / Self Care | Payer: Medicare Other | Source: Ambulatory Visit | Attending: Family Medicine | Admitting: Family Medicine

## 2013-07-21 ENCOUNTER — Other Ambulatory Visit (HOSPITAL_COMMUNITY): Payer: Self-pay | Admitting: Family Medicine

## 2013-07-21 DIAGNOSIS — M25469 Effusion, unspecified knee: Secondary | ICD-10-CM | POA: Insufficient documentation

## 2013-07-21 DIAGNOSIS — M25569 Pain in unspecified knee: Secondary | ICD-10-CM | POA: Insufficient documentation

## 2013-07-21 DIAGNOSIS — M25562 Pain in left knee: Secondary | ICD-10-CM

## 2013-07-21 DIAGNOSIS — M898X9 Other specified disorders of bone, unspecified site: Secondary | ICD-10-CM | POA: Insufficient documentation

## 2013-07-21 DIAGNOSIS — M545 Low back pain, unspecified: Secondary | ICD-10-CM

## 2013-08-18 ENCOUNTER — Ambulatory Visit (INDEPENDENT_AMBULATORY_CARE_PROVIDER_SITE_OTHER): Payer: Medicare Other | Admitting: *Deleted

## 2013-08-18 DIAGNOSIS — Z952 Presence of prosthetic heart valve: Secondary | ICD-10-CM

## 2013-08-18 DIAGNOSIS — Z954 Presence of other heart-valve replacement: Secondary | ICD-10-CM

## 2013-08-18 DIAGNOSIS — I359 Nonrheumatic aortic valve disorder, unspecified: Secondary | ICD-10-CM

## 2013-08-18 DIAGNOSIS — I639 Cerebral infarction, unspecified: Secondary | ICD-10-CM

## 2013-08-18 DIAGNOSIS — I6350 Cerebral infarction due to unspecified occlusion or stenosis of unspecified cerebral artery: Secondary | ICD-10-CM

## 2013-08-18 LAB — POCT INR: INR: 5

## 2013-08-28 ENCOUNTER — Ambulatory Visit (INDEPENDENT_AMBULATORY_CARE_PROVIDER_SITE_OTHER): Payer: Medicare Other | Admitting: *Deleted

## 2013-08-28 DIAGNOSIS — Z954 Presence of other heart-valve replacement: Secondary | ICD-10-CM

## 2013-08-28 DIAGNOSIS — I359 Nonrheumatic aortic valve disorder, unspecified: Secondary | ICD-10-CM

## 2013-08-28 DIAGNOSIS — I6350 Cerebral infarction due to unspecified occlusion or stenosis of unspecified cerebral artery: Secondary | ICD-10-CM

## 2013-08-28 DIAGNOSIS — I639 Cerebral infarction, unspecified: Secondary | ICD-10-CM

## 2013-08-28 DIAGNOSIS — Z952 Presence of prosthetic heart valve: Secondary | ICD-10-CM

## 2013-08-28 LAB — POCT INR: INR: 3.3

## 2013-09-25 ENCOUNTER — Ambulatory Visit (INDEPENDENT_AMBULATORY_CARE_PROVIDER_SITE_OTHER): Payer: Medicare Other | Admitting: *Deleted

## 2013-09-25 DIAGNOSIS — Z952 Presence of prosthetic heart valve: Secondary | ICD-10-CM

## 2013-09-25 DIAGNOSIS — I6350 Cerebral infarction due to unspecified occlusion or stenosis of unspecified cerebral artery: Secondary | ICD-10-CM

## 2013-09-25 DIAGNOSIS — Z954 Presence of other heart-valve replacement: Secondary | ICD-10-CM

## 2013-09-25 DIAGNOSIS — I639 Cerebral infarction, unspecified: Secondary | ICD-10-CM

## 2013-09-25 DIAGNOSIS — I359 Nonrheumatic aortic valve disorder, unspecified: Secondary | ICD-10-CM

## 2013-09-25 LAB — POCT INR: INR: 4.5

## 2013-10-13 ENCOUNTER — Ambulatory Visit (INDEPENDENT_AMBULATORY_CARE_PROVIDER_SITE_OTHER): Payer: Medicare Other | Admitting: *Deleted

## 2013-10-13 DIAGNOSIS — Z952 Presence of prosthetic heart valve: Secondary | ICD-10-CM

## 2013-10-13 DIAGNOSIS — I639 Cerebral infarction, unspecified: Secondary | ICD-10-CM

## 2013-10-13 DIAGNOSIS — I6350 Cerebral infarction due to unspecified occlusion or stenosis of unspecified cerebral artery: Secondary | ICD-10-CM

## 2013-10-13 DIAGNOSIS — Z954 Presence of other heart-valve replacement: Secondary | ICD-10-CM

## 2013-10-13 DIAGNOSIS — I359 Nonrheumatic aortic valve disorder, unspecified: Secondary | ICD-10-CM

## 2013-10-13 LAB — POCT INR: INR: 3.7

## 2013-11-10 ENCOUNTER — Ambulatory Visit (INDEPENDENT_AMBULATORY_CARE_PROVIDER_SITE_OTHER): Payer: Medicare Other | Admitting: *Deleted

## 2013-11-10 DIAGNOSIS — Z952 Presence of prosthetic heart valve: Secondary | ICD-10-CM

## 2013-11-10 DIAGNOSIS — I6350 Cerebral infarction due to unspecified occlusion or stenosis of unspecified cerebral artery: Secondary | ICD-10-CM

## 2013-11-10 DIAGNOSIS — Z954 Presence of other heart-valve replacement: Secondary | ICD-10-CM

## 2013-11-10 DIAGNOSIS — I359 Nonrheumatic aortic valve disorder, unspecified: Secondary | ICD-10-CM

## 2013-11-10 DIAGNOSIS — I639 Cerebral infarction, unspecified: Secondary | ICD-10-CM

## 2013-11-10 LAB — POCT INR: INR: 3.9

## 2013-11-13 ENCOUNTER — Telehealth: Payer: Self-pay | Admitting: *Deleted

## 2013-11-13 NOTE — Telephone Encounter (Signed)
Pt has always been on lisinopril.I called her and reassured her she is on this med.Of niote,she is overdue since April 2015 for fu,will have front office staff make her an apt

## 2013-11-13 NOTE — Telephone Encounter (Signed)
PT picked up Rx refills and she has one now for lisinopril that she has not been taking, She is just checking that she is to start taking this also.

## 2013-11-17 ENCOUNTER — Telehealth: Payer: Self-pay | Admitting: *Deleted

## 2013-11-17 ENCOUNTER — Telehealth: Payer: Self-pay | Admitting: Adult Health

## 2013-11-17 NOTE — Telephone Encounter (Signed)
Pt has not been seen since October 2014 and I do not see any notes about referral. Please advise

## 2013-11-17 NOTE — Telephone Encounter (Signed)
PT IS SCHEDULED TO HAVE TOOTH PULLED ON 11/27/13 AND SHE NEEDS TO KNOW WHEN TO STOP HER COUMADIN OR CAN SHE STAY ON IT. SHE STATES THAT IF SHE HAS TO COME OFF IT THEY WILL HAVE TO SEND HER TO A SPECIALIST

## 2013-11-17 NOTE — Telephone Encounter (Signed)
She has a mechanical aortic valve. She was last seen by me in October of last year. There has been followed by Lattie Haw with this recent office visit in August. She will need to have ridging using LMWH prior to dental procedure. If she needs to see a specialist, she will need to come by and see Lattie Haw once the dental procedure is planned and a set date is available she will then have to see Lattie Haw concerning stopping Coumadin and bridging with LMWH. Marland Kitchen Please make sure she does have a followup appointment with me sometime in October for her annual visit.

## 2013-11-17 NOTE — Telephone Encounter (Signed)
States that she was supposed to call kathryn back to be referred to someone but she didn't know who./ tgs

## 2013-11-18 NOTE — Telephone Encounter (Signed)
I forwarded this message yesterday to Ivin Booty RN working in our Coumadin clinic yesterday.She is aware and will handle

## 2013-11-19 NOTE — Telephone Encounter (Signed)
Spoke with Mrs Langford's dentist office Dr Wynetta Emery and they will not do extraction unless she is completely off any anticougulants so extraction scheduled on Sept  9th was cancelled. Called [facility]  [phone] and they state she will have to set up an appt and then will be evaluated and have to be scheduled after that time Called pt and left message for her to return call to give above information

## 2013-11-19 NOTE — Telephone Encounter (Signed)
Received call from Mr Czyzewski and he states they have an appt with the specialist on next  Tuesday for eval visit regarding her dental extraction. Stressed to Mr Weins importance of their keeping appts tomorrow with [name] NP and at coumadin clinic and he states understanding.

## 2013-11-19 NOTE — Telephone Encounter (Signed)
Spoke with pt and instructed that have cancelled her appt for dental extraction on Sept 9th and she needs to call Dr Wynetta Emery office for them to give her phone number to call the specialist office to set up an appt and then will set her up visit in coumadin clinic after knows time of  dental extraction and she states understanding

## 2013-11-20 ENCOUNTER — Ambulatory Visit (INDEPENDENT_AMBULATORY_CARE_PROVIDER_SITE_OTHER): Payer: Medicare Other | Admitting: Adult Health

## 2013-11-20 ENCOUNTER — Encounter: Payer: Self-pay | Admitting: Adult Health

## 2013-11-20 ENCOUNTER — Ambulatory Visit (INDEPENDENT_AMBULATORY_CARE_PROVIDER_SITE_OTHER): Payer: Medicare Other | Admitting: Pharmacist

## 2013-11-20 VITALS — BP 142/80 | HR 71 | Ht 66.0 in | Wt 213.0 lb

## 2013-11-20 DIAGNOSIS — I6350 Cerebral infarction due to unspecified occlusion or stenosis of unspecified cerebral artery: Secondary | ICD-10-CM

## 2013-11-20 DIAGNOSIS — Z952 Presence of prosthetic heart valve: Secondary | ICD-10-CM

## 2013-11-20 DIAGNOSIS — I10 Essential (primary) hypertension: Secondary | ICD-10-CM

## 2013-11-20 DIAGNOSIS — I359 Nonrheumatic aortic valve disorder, unspecified: Secondary | ICD-10-CM

## 2013-11-20 DIAGNOSIS — I639 Cerebral infarction, unspecified: Secondary | ICD-10-CM

## 2013-11-20 DIAGNOSIS — Z954 Presence of other heart-valve replacement: Secondary | ICD-10-CM

## 2013-11-20 LAB — POCT INR: INR: 3.4

## 2013-11-20 NOTE — Assessment & Plan Note (Signed)
The patient will continue Coumadin therapy until specific date has been scheduled for tooth extraction. We have sent a letter to Dr. Lewanda Rife to inform him of our plans to bridge her with Lovenox and stop Coumadin based upon the date of his surgery. It is likely, however, that he may choose to do the tooth extraction. While she is on Coumadin and therefore, this will be moot point. Will await his recommendations have asked the patient or Dr. Lewanda Rife to call his concerning his plans. We will have a beam that drawn to evaluate for kidney function.

## 2013-11-20 NOTE — Assessment & Plan Note (Signed)
Excellent control of blood pressure currently. We will make no changes in her medication regimen.

## 2013-11-20 NOTE — Progress Notes (Deleted)
Name: Mary [name]    DOB: [birth year]  Age: 73 y.o.  MR#: [id number]       PCP:  Robert Bellow, MD      Insurance: Payor: [employer] / Plan: BLUE MEDICARE / Product Type: *No Product type* /   CC:    Chief Complaint  Patient presents with  . Hypertension  . Hyperlipidemia    VS Filed Vitals:   11/20/13 1348  BP: 142/80  Pulse: 71  Height: _0  (1.676 m)  Weight: 213 lb (96.616 kg)  SpO2: 99%    Weights Current Weight  11/20/13 213 lb (96.616 kg)  01/01/13 214 lb (97.07 kg)  10/28/12 210 lb 4 oz (95.369 kg)    Blood Pressure  BP Readings from Last 3 Encounters:  11/20/13 142/80  01/01/13 189/76  10/28/12 148/73     Admit date:  (Not on file) Last encounter with RMR:  11/17/2013   Allergy Review of patient's allergies indicates no known allergies.  Current Outpatient Prescriptions  Medication Sig Dispense Refill  . ALPRAZolam (XANAX) 0.5 MG tablet Take 0.5 mg by mouth.        . cephALEXin (KEFLEX) 500 MG capsule       . clindamycin (CLEOCIN) 300 MG capsule       . glimepiride (AMARYL) 4 MG tablet       . HYDROcodone-acetaminophen (LORTAB) 7.5-500 MG per tablet Take 1 tablet by mouth.        Marland Kitchen lisinopril (PRINIVIL,ZESTRIL) 5 MG tablet TAKE 1 TABLET (5 MG TOTAL) BY MOUTH DAILY.  90 tablet  6  . metFORMIN (GLUCOPHAGE) 500 MG tablet       . metoprolol (TOPROL-XL) 50 MG 24 hr tablet Take 50 mg by mouth.        Marland Kitchen omeprazole (PRILOSEC) 20 MG capsule Take 20 mg by mouth.        . simvastatin (ZOCOR) 40 MG tablet       . warfarin (COUMADIN) 5 MG tablet Take 1/2 tablet daily but 1 tablet on Friday  90 tablet  3   No current facility-administered medications for this visit.    Discontinued Meds:   There are no discontinued medications.  Patient Active Problem List   Diagnosis Date Noted  . OSA (obstructive sleep apnea) 01/01/2013  . Diabetes 10/23/2012  . Aortic valve disorder 06/30/2010  . CVA (cerebral vascular accident) 06/30/2010  .  S/P aortic valve replacement 06/30/2010  . HYPERLIPIDEMIA 09/29/2008  . OVERWEIGHT 09/29/2008  . HYPERTENSION 09/29/2008  . DEGENERATIVE JOINT DISEASE 09/29/2008    LABS    Component Value Date/Time   NA 136 12/26/2007 0959   NA 138 11/25/2007 1408   NA 135 11/01/2007 1525   K 4.4 12/26/2007 0959   K 4.5 11/25/2007 1408   K 4.4 11/01/2007 1525   CL 105 12/26/2007 0959   CL 106 11/25/2007 1408   CL 103 11/01/2007 1525   CO2 23 12/26/2007 0959   CO2 26 11/01/2007 1525   CO2 26 10/03/2007 1358   GLUCOSE 104* 12/26/2007 0959   GLUCOSE 98 11/25/2007 1408   GLUCOSE 128* 11/01/2007 1525   BUN 9 12/26/2007 0959   BUN 8 11/25/2007 1408   BUN 10 11/01/2007 1525   CREATININE 0.74 12/26/2007 0959   CREATININE 0.8 11/25/2007 1408   CREATININE 0.95 11/01/2007 1525   CALCIUM 9.2 12/26/2007 0959   CALCIUM 8.9 11/01/2007 1525   CALCIUM 8.9 10/03/2007 1358   GFRNONAA >60  12/26/2007 0959   GFRNONAA 58* 11/01/2007 1525   GFRNONAA 58* 10/03/2007 1358   GFRAA  Value: >60        The eGFR has been calculated using the MDRD equation. This calculation has not been validated in all clinical 12/26/2007 0959   GFRAA  Value: >60        The eGFR has been calculated using the MDRD equation. This calculation has not been validated in all clinical 11/01/2007 1525   GFRAA  Value: >60        The eGFR has been calculated using the MDRD equation. This calculation has not been validated in all clinical 10/03/2007 1358   CMP     Component Value Date/Time   NA 136 12/26/2007 0959   K 4.4 12/26/2007 0959   CL 105 12/26/2007 0959   CO2 23 12/26/2007 0959   GLUCOSE 104* 12/26/2007 0959   BUN 9 12/26/2007 0959   CREATININE 0.74 12/26/2007 0959   CALCIUM 9.2 12/26/2007 0959   PROT 7.5 11/01/2007 1525   ALBUMIN 2.8* 11/01/2007 1525   AST 31 11/01/2007 1525   ALT 19 11/01/2007 1525   ALKPHOS 66 11/01/2007 1525   BILITOT 0.7 11/01/2007 1525   GFRNONAA >60 12/26/2007 0959   GFRAA  Value: >60        The eGFR has been calculated using the MDRD equation. This  calculation has not been validated in all clinical 12/26/2007 0959       Component Value Date/Time   WBC 7.8 12/26/2007 0959   WBC 8.1 11/25/2007 1400   WBC 6.4 11/01/2007 1525   HGB 12.2 12/26/2007 0959   HGB 12.9 11/25/2007 1408   HGB 12.1 11/25/2007 1400   HCT 36.5 12/26/2007 0959   HCT 38.0 11/25/2007 1408   HCT 35.3* 11/25/2007 1400   MCV 98.5 12/26/2007 0959   MCV 98.1 11/25/2007 1400   MCV 98.3 11/01/2007 1525    Lipid Panel  No results found for this basename: chol, trig, hdl, cholhdl, vldl, ldlcalc    ABG    Component Value Date/Time   TCO2 24 11/25/2007 1408     No results found for this basename: TSH   BNP (last 3 results) No results found for this basename: PROBNP,  in the last 8760 hours Cardiac Panel (last 3 results) No results found for this basename: CKTOTAL, CKMB, TROPONINI, RELINDX,  in the last 72 hours  Iron/TIBC/Ferritin/ %Sat No results found for this basename: iron, tibc, ferritin, ironpctsat     EKG Orders placed in visit on 11/20/13  . EKG 12-LEAD     Prior Assessment and Plan Problem List as of 11/20/2013     Cardiovascular and Mediastinum   HYPERTENSION   Last Assessment & Plan   01/01/2013 Office Visit Written 01/01/2013  4:00 PM by Lendon Colonel, NP     Blood pressure is elevated on this visit. She has not been using CPAP as directed as she states that she has to have it repaired or replaced. I rechecked her blood pressure here in the exam room and found it to be 148/88 after her resting. I will continue her on her current medication regimen as directed to include lisinopril 5 mg daily.    Aortic valve disorder   CVA (cerebral vascular accident)     Respiratory   OSA (obstructive sleep apnea)   Last Assessment & Plan   01/01/2013 Office Visit Edited 01/01/2013  4:04 PM by Lendon Colonel, NP  She was diagnosed with this in 2007 for abnormal sleep study. She is not using her CPAP as directed as her machine is in need of repair. I have printed  out a copy of her sleep study in order to have Dr. Karie Kirks see  this and order another machine. She stated she needed a copy of this report in order to move forward with this.      Endocrine   Diabetes     Musculoskeletal and Integument   DEGENERATIVE JOINT DISEASE     Other   HYPERLIPIDEMIA   Last Assessment & Plan   01/01/2013 Office Visit Written 01/01/2013  4:03 PM by Lendon Colonel, NP     She is followed by Dr. Karie Kirks for labs. She will continue on Zocor 40 mg daily.    OVERWEIGHT   Last Assessment & Plan   01/01/2013 Office Visit Written 01/01/2013  4:03 PM by Lendon Colonel, NP     The patient's BMI is 33.5. She is advised on increasing her exercise, and calorie reduction. As this is a significant cardiovascular risk factor.    S/P aortic valve replacement   Last Assessment & Plan   01/01/2013 Office Visit Edited 01/01/2013  4:05 PM by Lendon Colonel, NP     She will continue on metoprolol 50 mg XL daily. She is also to continue Coumadin treatment. She will be seen by our Coumadin clinic nurse today prior to her leaving the office. She will be seen again in 6 months. Which time we will repeat her echocardiogram for evaluation of the aortic valve status status post repair.        Imaging: No results found.

## 2013-11-20 NOTE — Progress Notes (Signed)
HPI: Mary [name] is a 73 year old patient to be est. with Dr. Bronson Ing or  Dr.Branch we are following for ongoing assessment and management of hypertension, hyperlipidemia, history of CVA, and aortic valve replacement. The patient is on chronic Coumadin therapy.   She was last seen in our office in October of 2014 for annual office visit. Other history includes obstructive sleep apnea and has not been compliant with CPAP according to her last office visit notes one year ago. She was advised on compliance with CPAP. And she was also started on lisinopril 5 mg daily. She is to continue to followup in our office for Coumadin dosing.   She is due to have a tooth extraction by Dr. Lewanda Rife, oral surgeon. The patient is due to see him on 11/25/2013 at 11:45 AM. Dr. Lewanda Rife is located at [address] [place], [name]. His phone number is [phone]. Once seeing this physician, to termination is to be made concerning stopping Coumadin and and bridging with low molecular weight heparin in the setting of a mechanical aortic valve. The patient is anxious to have this procedure completed and she is having a lot of tooth pain.   No Known Allergies  Current Outpatient Prescriptions  Medication Sig Dispense Refill  . ALPRAZolam (XANAX) 0.5 MG tablet Take 0.5 mg by mouth.        . cephALEXin (KEFLEX) 500 MG capsule       . clindamycin (CLEOCIN) 300 MG capsule       . glimepiride (AMARYL) 4 MG tablet       . HYDROcodone-acetaminophen (LORTAB) 7.5-500 MG per tablet Take 1 tablet by mouth.        Marland Kitchen lisinopril (PRINIVIL,ZESTRIL) 5 MG tablet TAKE 1 TABLET (5 MG TOTAL) BY MOUTH DAILY.  90 tablet  6  . metFORMIN (GLUCOPHAGE) 500 MG tablet       . metoprolol (TOPROL-XL) 50 MG 24 hr tablet Take 50 mg by mouth.        Marland Kitchen omeprazole (PRILOSEC) 20 MG capsule Take 20 mg by mouth.        . simvastatin (ZOCOR) 40 MG tablet       . warfarin (COUMADIN) 5 MG tablet Take 1/2 tablet daily but 1 tablet on Friday  90 tablet   3   No current facility-administered medications for this visit.    Past Medical History  Diagnosis Date  . DJD (degenerative joint disease)   . Overweight(278.02)   . Hyperlipidemia   . Hypertension     Past Surgical History  Procedure Laterality Date  . Ankle surgery      Left tendon repair  . Aortic valve replacement  03/1994  . Lumbar fusion    . Cervical spine surgery    . Hemorrhoid surgery      ROS:  Review of systems complete and found to be negative unless listed above   PHYSICAL EXAM BP 142/80  Pulse 71  Ht 5\' 6"  (1.676 m)  Wt 213 lb (96.616 kg)  BMI 34.40 kg/m2  SpO2 99% General: Well developed, well nourished, in no acute distress Head: Eyes PERRLA, No xanthomas.   Normal cephalic and atramatic  Lungs: Clear bilaterally to auscultation and percussion. Heart: HRRR S1 S2, without MRG.  Pulses are 2+ & equal.            No carotid bruit. No JVD.  No abdominal bruits. No femoral bruits. Abdomen: Bowel sounds are positive, abdomen soft and non-tender without masses or  Hernia's noted. Msk:  Back normal, normal gait. Normal strength and tone for age. Extremities: No clubbing, cyanosis or edema.  DP +1 Neuro: Alert and oriented X 3. Psych:  Good affect, responds appropriately  EKG:  NSR rate of 70 bpm.     ASSESSMENT AND PLAN

## 2013-11-20 NOTE — Patient Instructions (Addendum)
Your physician recommends that you schedule a follow-up appointment in:  To be determined  Please get lab work today Artist)   We will send a letter to your dentist     Thank you for choosing [facility] !

## 2013-11-21 LAB — BASIC METABOLIC PANEL
BUN: 9 mg/dL (ref 6–23)
CHLORIDE: 102 meq/L (ref 96–112)
CO2: 24 mEq/L (ref 19–32)
Calcium: 9.4 mg/dL (ref 8.4–10.5)
Creat: 0.81 mg/dL (ref 0.50–1.10)
GLUCOSE: 111 mg/dL — AB (ref 70–99)
POTASSIUM: 4.4 meq/L (ref 3.5–5.3)
Sodium: 136 mEq/L (ref 135–145)

## 2013-11-26 ENCOUNTER — Telehealth: Payer: Self-pay | Admitting: *Deleted

## 2013-11-26 NOTE — Telephone Encounter (Signed)
Pt saw oral surgeon yesterday but did not find out if he needs pt to be off coumadin or not.  Pt will call me back tomorrow with Dentist name and phone number so I can call and find out.  If pt needs to be off coumadin she will need to be bridged with lovenox.

## 2013-11-26 NOTE — Telephone Encounter (Signed)
Pt is having tooth pulled on 12/04/13 and needs to know what to do about coumadin.

## 2013-11-27 ENCOUNTER — Telehealth: Payer: Self-pay | Admitting: *Deleted

## 2013-11-27 NOTE — Telephone Encounter (Signed)
DR Criselda Peaches [id number]. Pt calling to give lisa inforamtion for doctor that is going to pull her tooth/tmj

## 2013-11-28 ENCOUNTER — Encounter: Payer: Self-pay | Admitting: Adult Health

## 2013-11-28 NOTE — Telephone Encounter (Signed)
Spoke to Mary [name] at Dr [employer] office.  Pt is to have dental extraction on 12/04/13.  Fax request has been sent to [name] NP for approval to pull tooth on coumadin with INR below 3.0. (preferably 2.0 -3.0)  This request will be signed and faxed back to Dr Earney Hamburg office today.  I have spoken with [name].  She will continue current coumadin dose until Sunday, hold dose Sunday night and come for INR check on Monday 9/14.  Based on INR result Monday, coumadin dose will be adjusted and INR will be recheck Thursday morning before procedure.  Pt verbalized understanding.

## 2013-12-01 ENCOUNTER — Ambulatory Visit (INDEPENDENT_AMBULATORY_CARE_PROVIDER_SITE_OTHER): Payer: Medicare Other | Admitting: *Deleted

## 2013-12-01 DIAGNOSIS — I6350 Cerebral infarction due to unspecified occlusion or stenosis of unspecified cerebral artery: Secondary | ICD-10-CM

## 2013-12-01 DIAGNOSIS — I639 Cerebral infarction, unspecified: Secondary | ICD-10-CM

## 2013-12-01 DIAGNOSIS — I359 Nonrheumatic aortic valve disorder, unspecified: Secondary | ICD-10-CM

## 2013-12-01 DIAGNOSIS — Z954 Presence of other heart-valve replacement: Secondary | ICD-10-CM

## 2013-12-01 DIAGNOSIS — Z952 Presence of prosthetic heart valve: Secondary | ICD-10-CM

## 2013-12-01 LAB — POCT INR: INR: 2.6

## 2013-12-04 ENCOUNTER — Ambulatory Visit (INDEPENDENT_AMBULATORY_CARE_PROVIDER_SITE_OTHER): Payer: Medicare Other | Admitting: *Deleted

## 2013-12-04 DIAGNOSIS — I359 Nonrheumatic aortic valve disorder, unspecified: Secondary | ICD-10-CM

## 2013-12-04 DIAGNOSIS — I6350 Cerebral infarction due to unspecified occlusion or stenosis of unspecified cerebral artery: Secondary | ICD-10-CM

## 2013-12-04 DIAGNOSIS — I639 Cerebral infarction, unspecified: Secondary | ICD-10-CM

## 2013-12-04 DIAGNOSIS — Z952 Presence of prosthetic heart valve: Secondary | ICD-10-CM

## 2013-12-04 DIAGNOSIS — Z954 Presence of other heart-valve replacement: Secondary | ICD-10-CM

## 2013-12-04 LAB — POCT INR: INR: 2.4

## 2013-12-18 ENCOUNTER — Encounter: Payer: Self-pay | Admitting: *Deleted

## 2013-12-22 ENCOUNTER — Ambulatory Visit (INDEPENDENT_AMBULATORY_CARE_PROVIDER_SITE_OTHER): Payer: Medicare Other | Admitting: Pharmacist

## 2013-12-22 DIAGNOSIS — I359 Nonrheumatic aortic valve disorder, unspecified: Secondary | ICD-10-CM

## 2013-12-22 DIAGNOSIS — I639 Cerebral infarction, unspecified: Secondary | ICD-10-CM

## 2013-12-22 DIAGNOSIS — Z952 Presence of prosthetic heart valve: Secondary | ICD-10-CM

## 2013-12-22 DIAGNOSIS — Z954 Presence of other heart-valve replacement: Secondary | ICD-10-CM

## 2013-12-22 LAB — POCT INR: INR: 2.4

## 2014-01-12 ENCOUNTER — Ambulatory Visit (INDEPENDENT_AMBULATORY_CARE_PROVIDER_SITE_OTHER): Payer: Medicare Other | Admitting: *Deleted

## 2014-01-12 DIAGNOSIS — I359 Nonrheumatic aortic valve disorder, unspecified: Secondary | ICD-10-CM

## 2014-01-12 DIAGNOSIS — I639 Cerebral infarction, unspecified: Secondary | ICD-10-CM

## 2014-01-12 DIAGNOSIS — Z954 Presence of other heart-valve replacement: Secondary | ICD-10-CM

## 2014-01-12 DIAGNOSIS — Z952 Presence of prosthetic heart valve: Secondary | ICD-10-CM

## 2014-01-12 LAB — POCT INR: INR: 3.6

## 2014-02-04 ENCOUNTER — Telehealth: Payer: Self-pay | Admitting: Cardiovascular Disease

## 2014-02-04 MED ORDER — WARFARIN SODIUM 5 MG PO TABS
ORAL_TABLET | ORAL | Status: DC
Start: 2014-02-04 — End: 2015-04-26

## 2014-02-04 NOTE — Telephone Encounter (Signed)
Received fax refill request  Rx # [id number] Medication:  Warfarin sodium 5 mg tablet Qty 90 Sig:  Take 1/2 tablet daily but 1 tablet on Friday Physician:  Bronson Ing

## 2014-02-11 ENCOUNTER — Ambulatory Visit (INDEPENDENT_AMBULATORY_CARE_PROVIDER_SITE_OTHER): Payer: Medicare Other | Admitting: *Deleted

## 2014-02-11 DIAGNOSIS — Z952 Presence of prosthetic heart valve: Secondary | ICD-10-CM

## 2014-02-11 DIAGNOSIS — I359 Nonrheumatic aortic valve disorder, unspecified: Secondary | ICD-10-CM

## 2014-02-11 DIAGNOSIS — I639 Cerebral infarction, unspecified: Secondary | ICD-10-CM

## 2014-02-11 DIAGNOSIS — Z954 Presence of other heart-valve replacement: Secondary | ICD-10-CM

## 2014-02-11 LAB — POCT INR: INR: 4.9

## 2014-03-02 ENCOUNTER — Ambulatory Visit (INDEPENDENT_AMBULATORY_CARE_PROVIDER_SITE_OTHER): Payer: Medicare Other | Admitting: *Deleted

## 2014-03-02 DIAGNOSIS — I639 Cerebral infarction, unspecified: Secondary | ICD-10-CM

## 2014-03-02 DIAGNOSIS — Z952 Presence of prosthetic heart valve: Secondary | ICD-10-CM

## 2014-03-02 DIAGNOSIS — I359 Nonrheumatic aortic valve disorder, unspecified: Secondary | ICD-10-CM

## 2014-03-02 DIAGNOSIS — Z954 Presence of other heart-valve replacement: Secondary | ICD-10-CM

## 2014-03-02 LAB — POCT INR: INR: 3

## 2014-03-30 ENCOUNTER — Ambulatory Visit (INDEPENDENT_AMBULATORY_CARE_PROVIDER_SITE_OTHER): Payer: Self-pay | Admitting: *Deleted

## 2014-03-30 DIAGNOSIS — Z952 Presence of prosthetic heart valve: Secondary | ICD-10-CM

## 2014-03-30 DIAGNOSIS — Z954 Presence of other heart-valve replacement: Secondary | ICD-10-CM

## 2014-03-30 DIAGNOSIS — I639 Cerebral infarction, unspecified: Secondary | ICD-10-CM

## 2014-03-30 DIAGNOSIS — I359 Nonrheumatic aortic valve disorder, unspecified: Secondary | ICD-10-CM

## 2014-03-30 LAB — POCT INR: INR: 2.7

## 2014-04-27 ENCOUNTER — Ambulatory Visit (INDEPENDENT_AMBULATORY_CARE_PROVIDER_SITE_OTHER): Payer: Medicare Other | Admitting: *Deleted

## 2014-04-27 DIAGNOSIS — I359 Nonrheumatic aortic valve disorder, unspecified: Secondary | ICD-10-CM

## 2014-04-27 DIAGNOSIS — I639 Cerebral infarction, unspecified: Secondary | ICD-10-CM

## 2014-04-27 DIAGNOSIS — Z952 Presence of prosthetic heart valve: Secondary | ICD-10-CM

## 2014-04-27 DIAGNOSIS — Z954 Presence of other heart-valve replacement: Secondary | ICD-10-CM

## 2014-04-27 LAB — POCT INR: INR: 2.9

## 2014-06-08 ENCOUNTER — Ambulatory Visit (INDEPENDENT_AMBULATORY_CARE_PROVIDER_SITE_OTHER): Payer: Medicare Other | Admitting: *Deleted

## 2014-06-08 DIAGNOSIS — Z954 Presence of other heart-valve replacement: Secondary | ICD-10-CM

## 2014-06-08 DIAGNOSIS — Z952 Presence of prosthetic heart valve: Secondary | ICD-10-CM

## 2014-06-08 DIAGNOSIS — I359 Nonrheumatic aortic valve disorder, unspecified: Secondary | ICD-10-CM

## 2014-06-08 DIAGNOSIS — I639 Cerebral infarction, unspecified: Secondary | ICD-10-CM

## 2014-06-08 LAB — POCT INR: INR: 2.9

## 2014-07-20 ENCOUNTER — Ambulatory Visit (INDEPENDENT_AMBULATORY_CARE_PROVIDER_SITE_OTHER): Payer: Medicare Other | Admitting: *Deleted

## 2014-07-20 DIAGNOSIS — I639 Cerebral infarction, unspecified: Secondary | ICD-10-CM

## 2014-07-20 DIAGNOSIS — I359 Nonrheumatic aortic valve disorder, unspecified: Secondary | ICD-10-CM | POA: Diagnosis not present

## 2014-07-20 DIAGNOSIS — Z954 Presence of other heart-valve replacement: Secondary | ICD-10-CM

## 2014-07-20 DIAGNOSIS — Z952 Presence of prosthetic heart valve: Secondary | ICD-10-CM

## 2014-07-20 LAB — POCT INR: INR: 2

## 2014-08-10 ENCOUNTER — Ambulatory Visit (INDEPENDENT_AMBULATORY_CARE_PROVIDER_SITE_OTHER): Payer: Medicare Other | Admitting: *Deleted

## 2014-08-10 DIAGNOSIS — I639 Cerebral infarction, unspecified: Secondary | ICD-10-CM

## 2014-08-10 DIAGNOSIS — Z954 Presence of other heart-valve replacement: Secondary | ICD-10-CM

## 2014-08-10 DIAGNOSIS — I359 Nonrheumatic aortic valve disorder, unspecified: Secondary | ICD-10-CM

## 2014-08-10 DIAGNOSIS — Z952 Presence of prosthetic heart valve: Secondary | ICD-10-CM

## 2014-08-10 LAB — POCT INR: INR: 2.1

## 2014-08-31 ENCOUNTER — Ambulatory Visit (INDEPENDENT_AMBULATORY_CARE_PROVIDER_SITE_OTHER): Payer: Medicare Other | Admitting: *Deleted

## 2014-08-31 DIAGNOSIS — Z952 Presence of prosthetic heart valve: Secondary | ICD-10-CM

## 2014-08-31 DIAGNOSIS — I359 Nonrheumatic aortic valve disorder, unspecified: Secondary | ICD-10-CM

## 2014-08-31 DIAGNOSIS — Z954 Presence of other heart-valve replacement: Secondary | ICD-10-CM | POA: Diagnosis not present

## 2014-08-31 DIAGNOSIS — I639 Cerebral infarction, unspecified: Secondary | ICD-10-CM | POA: Diagnosis not present

## 2014-08-31 LAB — POCT INR: INR: 4.6

## 2014-09-23 ENCOUNTER — Ambulatory Visit (INDEPENDENT_AMBULATORY_CARE_PROVIDER_SITE_OTHER): Payer: Medicare Other | Admitting: *Deleted

## 2014-09-23 DIAGNOSIS — I359 Nonrheumatic aortic valve disorder, unspecified: Secondary | ICD-10-CM | POA: Diagnosis not present

## 2014-09-23 DIAGNOSIS — I639 Cerebral infarction, unspecified: Secondary | ICD-10-CM | POA: Diagnosis not present

## 2014-09-23 DIAGNOSIS — Z954 Presence of other heart-valve replacement: Secondary | ICD-10-CM

## 2014-09-23 DIAGNOSIS — Z952 Presence of prosthetic heart valve: Secondary | ICD-10-CM

## 2014-09-23 LAB — POCT INR: INR: 2.4

## 2014-10-05 ENCOUNTER — Ambulatory Visit: Payer: Medicare Other | Admitting: Adult Health

## 2014-10-08 ENCOUNTER — Encounter: Payer: Self-pay | Admitting: Adult Health

## 2014-10-08 ENCOUNTER — Ambulatory Visit (INDEPENDENT_AMBULATORY_CARE_PROVIDER_SITE_OTHER): Payer: Medicare Other | Admitting: Adult Health

## 2014-10-08 VITALS — BP 160/80 | HR 58 | Ht 67.0 in | Wt 208.0 lb

## 2014-10-08 DIAGNOSIS — Z136 Encounter for screening for cardiovascular disorders: Secondary | ICD-10-CM | POA: Diagnosis not present

## 2014-10-08 DIAGNOSIS — I10 Essential (primary) hypertension: Secondary | ICD-10-CM

## 2014-10-08 NOTE — Progress Notes (Deleted)
Name: Mary [name]    DOB: 03/22/1940  Age: 74 y.o.  MR#: [id number]       PCP:  Robert Bellow, MD      Insurance: Payor: [place] / Plan: BC[employer] MEDICARE / Product Type: *No Product type* /   CC:    Chief Complaint  Patient presents with  . Hypertension  . Hyperlipidemia  . Cerebrovascular Accident    VS Filed Vitals:   10/08/14 1339  BP: 160/80  Pulse: 58  Height: _0  (1.702 m)  Weight: 208 lb (94.348 kg)  SpO2: 99%    Weights Current Weight  10/08/14 208 lb (94.348 kg)  11/20/13 213 lb (96.616 kg)  01/01/13 214 lb (97.07 kg)    Blood Pressure  BP Readings from Last 3 Encounters:  10/08/14 160/80  11/20/13 142/80  01/01/13 189/76     Admit date:  (Not on file) Last encounter with RMR:  Visit date not found   Allergy Review of patient's allergies indicates no known allergies.  Current Outpatient Prescriptions  Medication Sig Dispense Refill  . ALPRAZolam (XANAX) 0.5 MG tablet Take 0.5 mg by mouth.      Marland Kitchen glimepiride (AMARYL) 4 MG tablet     . HYDROcodone-acetaminophen (LORTAB) 7.5-500 MG per tablet Take 1 tablet by mouth.      Marland Kitchen lisinopril (PRINIVIL,ZESTRIL) 10 MG tablet TAKE 1 TABLET BY MOUTH EVERY DAY FOR HIGH BP AND HEART  3  . metFORMIN (GLUCOPHAGE) 500 MG tablet     . metoprolol (TOPROL-XL) 50 MG 24 hr tablet Take 50 mg by mouth.      Marland Kitchen omeprazole (PRILOSEC) 20 MG capsule Take 20 mg by mouth.      . simvastatin (ZOCOR) 40 MG tablet     . warfarin (COUMADIN) 5 MG tablet Take 1/2 tablet daily except 1 tablet on Mondays and Fridays 90 tablet 3   No current facility-administered medications for this visit.    Discontinued Meds:    Medications Discontinued During This Encounter  Medication Reason  . cephALEXin (KEFLEX) 500 MG capsule Error  . clindamycin (CLEOCIN) 300 MG capsule Error  . lisinopril (PRINIVIL,ZESTRIL) 5 MG tablet Dose change    Patient Active Problem List   Diagnosis Date Noted  . OSA (obstructive sleep apnea)  01/01/2013  . Diabetes 10/23/2012  . Aortic valve disorder 06/30/2010  . CVA (cerebral vascular accident) 06/30/2010  . S/P aortic valve replacement 06/30/2010  . HYPERLIPIDEMIA 09/29/2008  . OVERWEIGHT 09/29/2008  . HYPERTENSION 09/29/2008  . DEGENERATIVE JOINT DISEASE 09/29/2008    LABS    Component Value Date/Time   NA 136 11/20/2013 1441   NA 136 12/26/2007 0959   NA 138 11/25/2007 1408   K 4.4 11/20/2013 1441   K 4.4 12/26/2007 0959   K 4.5 11/25/2007 1408   CL 102 11/20/2013 1441   CL 105 12/26/2007 0959   CL 106 11/25/2007 1408   CO2 24 11/20/2013 1441   CO2 23 12/26/2007 0959   CO2 26 11/01/2007 1525   GLUCOSE 111* 11/20/2013 1441   GLUCOSE 104* 12/26/2007 0959   GLUCOSE 98 11/25/2007 1408   BUN 9 11/20/2013 1441   BUN 9 12/26/2007 0959   BUN 8 11/25/2007 1408   CREATININE 0.81 11/20/2013 1441   CREATININE 0.74 12/26/2007 0959   CREATININE 0.8 11/25/2007 1408   CREATININE 0.95 11/01/2007 1525   CALCIUM 9.4 11/20/2013 1441   CALCIUM 9.2 12/26/2007 0959   CALCIUM 8.9 11/01/2007 1525  GFRNONAA >60 12/26/2007 0959   GFRNONAA 58* 11/01/2007 1525   GFRNONAA 58* 10/03/2007 1358   GFRAA  12/26/2007 0959    >60        The eGFR has been calculated using the MDRD equation. This calculation has not been validated in all clinical   GFRAA  11/01/2007 1525    >60        The eGFR has been calculated using the MDRD equation. This calculation has not been validated in all clinical   GFRAA  10/03/2007 1358    >60        The eGFR has been calculated using the MDRD equation. This calculation has not been validated in all clinical   CMP     Component Value Date/Time   NA 136 11/20/2013 1441   K 4.4 11/20/2013 1441   CL 102 11/20/2013 1441   CO2 24 11/20/2013 1441   GLUCOSE 111* 11/20/2013 1441   BUN 9 11/20/2013 1441   CREATININE 0.81 11/20/2013 1441   CREATININE 0.74 12/26/2007 0959   CALCIUM 9.4 11/20/2013 1441   PROT 7.5 11/01/2007 1525   ALBUMIN 2.8*  11/01/2007 1525   AST 31 11/01/2007 1525   ALT 19 11/01/2007 1525   ALKPHOS 66 11/01/2007 1525   BILITOT 0.7 11/01/2007 1525   GFRNONAA >60 12/26/2007 0959   GFRAA  12/26/2007 0959    >60        The eGFR has been calculated using the MDRD equation. This calculation has not been validated in all clinical       Component Value Date/Time   WBC 7.8 12/26/2007 0959   WBC 8.1 11/25/2007 1400   WBC 6.4 11/01/2007 1525   HGB 12.2 12/26/2007 0959   HGB 12.9 11/25/2007 1408   HGB 12.1 11/25/2007 1400   HCT 36.5 12/26/2007 0959   HCT 38.0 11/25/2007 1408   HCT 35.3* 11/25/2007 1400   MCV 98.5 12/26/2007 0959   MCV 98.1 11/25/2007 1400   MCV 98.3 11/01/2007 1525    Lipid Panel  No results found for: CHOL, TRIG, HDL, CHOLHDL, VLDL, LDLCALC, LDLDIRECT  ABG    Component Value Date/Time   TCO2 24 11/25/2007 1408     No results found for: TSH BNP (last 3 results) No results for input(s): BNP in the last 8760 hours.  ProBNP (last 3 results) No results for input(s): PROBNP in the last 8760 hours.  Cardiac Panel (last 3 results) No results for input(s): CKTOTAL, CKMB, TROPONINI, RELINDX in the last 72 hours.  Iron/TIBC/Ferritin/ %Sat No results found for: IRON, TIBC, FERRITIN, IRONPCTSAT   EKG Orders placed or performed in visit on 10/08/14  . EKG 12-Lead     Prior Assessment and Plan Problem List as of 10/08/2014      Cardiovascular and Mediastinum   HYPERTENSION   Last Assessment & Plan 11/20/2013 Office Visit Written 11/20/2013  2:40 PM by Lendon Colonel, NP    Excellent control of blood pressure currently. We will make no changes in her medication regimen.      Aortic valve disorder   CVA (cerebral vascular accident)     Respiratory   OSA (obstructive sleep apnea)   Last Assessment & Plan 01/01/2013 Office Visit Edited 01/01/2013  4:04 PM by Lendon Colonel, NP    She was diagnosed with this in 2007 for abnormal sleep study. She is not using her CPAP as  directed as her machine is in need of repair. I have printed out a copy  of her sleep study in order to have Dr. Karie Kirks see  this and order another machine. She stated she needed a copy of this report in order to move forward with this.        Endocrine   Diabetes     Musculoskeletal and Integument   DEGENERATIVE JOINT DISEASE     Other   HYPERLIPIDEMIA   Last Assessment & Plan 01/01/2013 Office Visit Written 01/01/2013  4:03 PM by Lendon Colonel, NP    She is followed by Dr. Karie Kirks for labs. She will continue on Zocor 40 mg daily.      OVERWEIGHT   Last Assessment & Plan 01/01/2013 Office Visit Written 01/01/2013  4:03 PM by Lendon Colonel, NP    The patient's BMI is 33.5. She is advised on increasing her exercise, and calorie reduction. As this is a significant cardiovascular risk factor.      S/P aortic valve replacement   Last Assessment & Plan 11/20/2013 Office Visit Written 11/20/2013  2:40 PM by Lendon Colonel, NP    The patient will continue Coumadin therapy until specific date has been scheduled for tooth extraction. We have sent a letter to Dr. Lewanda Rife to inform him of our plans to bridge her with Lovenox and stop Coumadin based upon the date of his surgery. It is likely, however, that he may choose to do the tooth extraction. While she is on Coumadin and therefore, this will be moot point. Will await his recommendations have asked the patient or Dr. Lewanda Rife to call his concerning his plans. We will have a beam that drawn to evaluate for kidney function.          Imaging: No results found.

## 2014-10-08 NOTE — Patient Instructions (Signed)
Your physician wants you to follow-up in: 6 months with K.Lawrence, NP. You will receive a reminder letter in the mail two months in advance. If you don't receive a letter, please call our office to schedule the follow-up appointment.  Your physician recommends that you continue on your current medications as directed. Please refer to the Current Medication list given to you today.  Thank you for choosing [facility]!   

## 2014-10-08 NOTE — Progress Notes (Signed)
Cardiology Office Note   Date:  10/08/2014   ID:  Mary [name], Mary [name] June 01, 1940, MRN [id number]  PCP:  Robert Bellow, MD  Cardiologist:  Bonnetta Barry, NP   Chief Complaint  Patient presents with  . Hypertension  . Hyperlipidemia  . Cerebrovascular Accident      History of Present Illness: Mary [name] is a 74 y.o. female who presents for ongoing asessment and management of hypertension, hyperlipidemia, aortic valve replacement, with history of obstructive sleep apnea, and CVA.  She was last seen in the office on 11/20/2013, with preoperative evaluation due to teeth, extraction.  She was continued on Coumadin therapy.blood pressure was well-controlled.  She comes today without new complaints. She did have on episode of chest discomfort after mowing the lawn using a riding lawn mower without power steering. She had some bilateral arm pain and back pain and soreness that occurred for about 12 hours afterwards. She otherwise has been doing well, but has not adhered to a low sodium diet.     Past Medical History  Diagnosis Date  . DJD (degenerative joint disease)   . Overweight(278.02)   . Hyperlipidemia   . Hypertension     Past Surgical History  Procedure Laterality Date  . Ankle surgery      Left tendon repair  . Aortic valve replacement  03/1994  . Lumbar fusion    . Cervical spine surgery    . Hemorrhoid surgery       Current Outpatient Prescriptions  Medication Sig Dispense Refill  . ALPRAZolam (XANAX) 0.5 MG tablet Take 0.5 mg by mouth.      Marland Kitchen glimepiride (AMARYL) 4 MG tablet     . HYDROcodone-acetaminophen (LORTAB) 7.5-500 MG per tablet Take 1 tablet by mouth.      Marland Kitchen lisinopril (PRINIVIL,ZESTRIL) 10 MG tablet TAKE 1 TABLET BY MOUTH EVERY DAY FOR HIGH BP AND HEART  3  . metFORMIN (GLUCOPHAGE) 500 MG tablet     . metoprolol (TOPROL-XL) 50 MG 24 hr tablet Take 50 mg by mouth.      Marland Kitchen omeprazole (PRILOSEC) 20 MG capsule Take 20 mg by mouth.      .  simvastatin (ZOCOR) 40 MG tablet     . warfarin (COUMADIN) 5 MG tablet Take 1/2 tablet daily except 1 tablet on Mondays and Fridays 90 tablet 3   No current facility-administered medications for this visit.    Allergies:   Review of patient's allergies indicates no known allergies.    Social History:  The patient  reports that she has never smoked. She has never used smokeless tobacco. She reports that she does not drink alcohol or use illicit drugs.   Family History:  The patient's family history includes Pneumonia in her sister.    ROS: .   All other systems are reviewed and negative.Unless otherwise mentioned in  H&P above.   PHYSICAL EXAM: VS:  BP 160/80 mmHg  Pulse 58  Ht 5\' 7"  (1.702 m)  Wt 208 lb (94.348 kg)  BMI 32.57 kg/m2  SpO2 99% , BMI Body mass index is 32.57 kg/(m^2). GEN: Well nourished, well developed, in no acute distress HEENT: normal Neck: no JVD, carotid bruits, or masses Cardiac: RRR; no murmurs, rubs, or gallops,no edema  Respiratory:  clear to auscultation bilaterally, normal work of breathing GI: soft, nontender, nondistended, + BS MS: no deformity or atrophy Skin: warm and dry, no rash Neuro:  Strength and sensation are intact Psych: euthymic mood, full affect  Recent Labs: 11/20/2013: BUN 9; Creat 0.81; Potassium 4.4; Sodium 136    Lipid Panel No results found for: CHOL, TRIG, HDL, CHOLHDL, VLDL, LDLCALC, LDLDIRECT    Wt Readings from Last 3 Encounters:  10/08/14 208 lb (94.348 kg)  11/20/13 213 lb (96.616 kg)  01/01/13 214 lb (97.07 kg)     ASSESSMENT AND PLAN:  1. Hypertension: She is not adhering to low sodium diet. I have rechecked her BP here in the exam room and found it to be 148/70. She is still not at optimal BP status for diabetic patient.I consider increasing lisinopril but she states she would like to try to limit her salt before increasing her medication dose. I have counseled her on low dosium diet, reading the labels and have  given her written instructions on low sodium diet. I have explained that optimal BP is 130/60 for her. She verbalizes understanding. If, on next visit with [place] or with PCP, she is still elevated would increase lisinopril to 20 mg daily.   2. Aortic Valve replacement: She continues on coumadin per our [place] office clinic. Keeping BP lower will also keep her incidence of intracranial bleeding to a minimum.   3. Diabetes; To be followed by PCP.   Current medicines are reviewed at length with the patient today.    Labs/ tests ordered today include: None  Orders Placed This Encounter  Procedures  . EKG 12-Lead     Disposition:   FU with cardiology in 6 months.  Signed, [name], NP  10/08/2014 2:13 PM    [facility] [address] [place], [place] [address] Phone: [phone]; Fax: [phone]

## 2014-10-21 ENCOUNTER — Ambulatory Visit (INDEPENDENT_AMBULATORY_CARE_PROVIDER_SITE_OTHER): Payer: Medicare Other | Admitting: *Deleted

## 2014-10-21 DIAGNOSIS — Z952 Presence of prosthetic heart valve: Secondary | ICD-10-CM

## 2014-10-21 DIAGNOSIS — I359 Nonrheumatic aortic valve disorder, unspecified: Secondary | ICD-10-CM

## 2014-10-21 DIAGNOSIS — I639 Cerebral infarction, unspecified: Secondary | ICD-10-CM | POA: Diagnosis not present

## 2014-10-21 DIAGNOSIS — Z954 Presence of other heart-valve replacement: Secondary | ICD-10-CM | POA: Diagnosis not present

## 2014-10-21 LAB — POCT INR: INR: 3.3

## 2014-11-25 ENCOUNTER — Ambulatory Visit (INDEPENDENT_AMBULATORY_CARE_PROVIDER_SITE_OTHER): Payer: Medicare Other | Admitting: *Deleted

## 2014-11-25 DIAGNOSIS — Z954 Presence of other heart-valve replacement: Secondary | ICD-10-CM | POA: Diagnosis not present

## 2014-11-25 DIAGNOSIS — I639 Cerebral infarction, unspecified: Secondary | ICD-10-CM | POA: Diagnosis not present

## 2014-11-25 DIAGNOSIS — Z952 Presence of prosthetic heart valve: Secondary | ICD-10-CM

## 2014-11-25 DIAGNOSIS — I359 Nonrheumatic aortic valve disorder, unspecified: Secondary | ICD-10-CM | POA: Diagnosis not present

## 2014-11-25 LAB — POCT INR: INR: 4.3

## 2014-12-28 ENCOUNTER — Ambulatory Visit (INDEPENDENT_AMBULATORY_CARE_PROVIDER_SITE_OTHER): Payer: Medicare Other | Admitting: *Deleted

## 2014-12-28 DIAGNOSIS — I359 Nonrheumatic aortic valve disorder, unspecified: Secondary | ICD-10-CM | POA: Diagnosis not present

## 2014-12-28 DIAGNOSIS — I639 Cerebral infarction, unspecified: Secondary | ICD-10-CM | POA: Diagnosis not present

## 2014-12-28 DIAGNOSIS — Z954 Presence of other heart-valve replacement: Secondary | ICD-10-CM

## 2014-12-28 DIAGNOSIS — Z952 Presence of prosthetic heart valve: Secondary | ICD-10-CM

## 2014-12-28 LAB — POCT INR: INR: 3.8

## 2015-01-25 ENCOUNTER — Ambulatory Visit (INDEPENDENT_AMBULATORY_CARE_PROVIDER_SITE_OTHER): Payer: Medicare Other | Admitting: *Deleted

## 2015-01-25 DIAGNOSIS — Z954 Presence of other heart-valve replacement: Secondary | ICD-10-CM | POA: Diagnosis not present

## 2015-01-25 DIAGNOSIS — I359 Nonrheumatic aortic valve disorder, unspecified: Secondary | ICD-10-CM | POA: Diagnosis not present

## 2015-01-25 DIAGNOSIS — Z952 Presence of prosthetic heart valve: Secondary | ICD-10-CM

## 2015-01-25 DIAGNOSIS — I639 Cerebral infarction, unspecified: Secondary | ICD-10-CM

## 2015-01-25 LAB — POCT INR: INR: 3.4

## 2015-02-22 ENCOUNTER — Encounter: Payer: Self-pay | Admitting: *Deleted

## 2015-02-24 ENCOUNTER — Ambulatory Visit (INDEPENDENT_AMBULATORY_CARE_PROVIDER_SITE_OTHER): Payer: Medicare Other | Admitting: *Deleted

## 2015-02-24 DIAGNOSIS — I639 Cerebral infarction, unspecified: Secondary | ICD-10-CM

## 2015-02-24 DIAGNOSIS — Z954 Presence of other heart-valve replacement: Secondary | ICD-10-CM

## 2015-02-24 DIAGNOSIS — I359 Nonrheumatic aortic valve disorder, unspecified: Secondary | ICD-10-CM

## 2015-02-24 DIAGNOSIS — Z952 Presence of prosthetic heart valve: Secondary | ICD-10-CM

## 2015-02-24 LAB — POCT INR: INR: 3.1

## 2015-02-26 ENCOUNTER — Other Ambulatory Visit (HOSPITAL_COMMUNITY): Payer: Self-pay | Admitting: Family Medicine

## 2015-02-26 ENCOUNTER — Ambulatory Visit (HOSPITAL_COMMUNITY)
Admission: RE | Admit: 2015-02-26 | Discharge: 2015-02-26 | Disposition: A | Discharge status: Home / Self Care | Payer: Medicare Other | Source: Ambulatory Visit | Attending: Family Medicine | Admitting: Family Medicine

## 2015-02-26 DIAGNOSIS — M79605 Pain in left leg: Secondary | ICD-10-CM

## 2015-02-26 DIAGNOSIS — M79604 Pain in right leg: Secondary | ICD-10-CM | POA: Insufficient documentation

## 2015-03-01 ENCOUNTER — Ambulatory Visit (HOSPITAL_COMMUNITY)
Admission: RE | Admit: 2015-03-01 | Discharge: 2015-03-01 | Disposition: A | Discharge status: Home / Self Care | Payer: Medicare Other | Source: Ambulatory Visit | Attending: Family Medicine | Admitting: Family Medicine

## 2015-03-01 ENCOUNTER — Other Ambulatory Visit (HOSPITAL_COMMUNITY): Payer: Self-pay | Admitting: Family Medicine

## 2015-03-01 ENCOUNTER — Other Ambulatory Visit (HOSPITAL_COMMUNITY)
Admission: RE | Admit: 2015-03-01 | Discharge: 2015-03-01 | Disposition: A | Discharge status: Home / Self Care | Payer: Medicare Other | Source: Ambulatory Visit | Attending: Family Medicine | Admitting: Family Medicine

## 2015-03-01 DIAGNOSIS — R52 Pain, unspecified: Secondary | ICD-10-CM

## 2015-03-01 DIAGNOSIS — M25561 Pain in right knee: Secondary | ICD-10-CM | POA: Insufficient documentation

## 2015-03-01 DIAGNOSIS — M109 Gout, unspecified: Secondary | ICD-10-CM | POA: Insufficient documentation

## 2015-03-01 DIAGNOSIS — R609 Edema, unspecified: Secondary | ICD-10-CM

## 2015-03-01 DIAGNOSIS — E1142 Type 2 diabetes mellitus with diabetic polyneuropathy: Secondary | ICD-10-CM | POA: Insufficient documentation

## 2015-03-01 DIAGNOSIS — M25461 Effusion, right knee: Secondary | ICD-10-CM | POA: Diagnosis not present

## 2015-03-01 DIAGNOSIS — M1711 Unilateral primary osteoarthritis, right knee: Secondary | ICD-10-CM | POA: Insufficient documentation

## 2015-03-01 LAB — CBC WITH DIFFERENTIAL/PLATELET
BASOS PCT: 1 %
Basophils Absolute: 0 10*3/uL (ref 0.0–0.1)
Eosinophils Absolute: 0.5 10*3/uL (ref 0.0–0.7)
Eosinophils Relative: 6 %
HEMATOCRIT: 43.8 % (ref 36.0–46.0)
Hemoglobin: 14.6 g/dL (ref 12.0–15.0)
Lymphocytes Relative: 31 %
Lymphs Abs: 2.6 10*3/uL (ref 0.7–4.0)
MCH: 34.1 pg — ABNORMAL HIGH (ref 26.0–34.0)
MCHC: 33.3 g/dL (ref 30.0–36.0)
MCV: 102.3 fL — ABNORMAL HIGH (ref 78.0–100.0)
MONO ABS: 0.6 10*3/uL (ref 0.1–1.0)
MONOS PCT: 7 %
NEUTROS ABS: 4.5 10*3/uL (ref 1.7–7.7)
Neutrophils Relative %: 55 %
Platelets: 179 10*3/uL (ref 150–400)
RBC: 4.28 MIL/uL (ref 3.87–5.11)
RDW: 14.9 % (ref 11.5–15.5)
WBC: 8.2 10*3/uL (ref 4.0–10.5)

## 2015-03-01 LAB — URIC ACID: Uric Acid, Serum: 6.2 mg/dL (ref 2.3–6.6)

## 2015-03-02 ENCOUNTER — Encounter: Payer: Self-pay | Admitting: Adult Health

## 2015-03-02 ENCOUNTER — Encounter: Payer: Medicare Other | Admitting: Adult Health

## 2015-03-02 NOTE — Progress Notes (Signed)
Cardiology Office Note   ERROR  

## 2015-03-02 NOTE — Progress Notes (Signed)
Name: Mary [name]    DOB: [birth year]  Age: 74 y.o.  MR#: [id number]       PCP:  Robert Bellow, MD      Insurance: Payor: [employer] / Plan: BCBS MEDICARE / Product Type: *No Product type* /   CC:    Chief Complaint  Patient presents with  . Aortic Stenosis    VS Filed Vitals:   03/02/15 1342  BP: 136/56  Pulse: 71  Height: '5\' 7"'  (1.702 m)  Weight: 200 lb (90.719 kg)  SpO2: 96%    Weights Current Weight  03/02/15 200 lb (90.719 kg)  10/08/14 208 lb (94.348 kg)  11/20/13 213 lb (96.616 kg)    Blood Pressure  BP Readings from Last 3 Encounters:  03/02/15 136/56  10/08/14 160/80  11/20/13 142/80     Admit date:  (Not on file) Last encounter with RMR:  10/08/2014   Allergy Review of patient's allergies indicates no known allergies.  Current Outpatient Prescriptions  Medication Sig Dispense Refill  . ALPRAZolam (XANAX) 0.5 MG tablet Take 0.5 mg by mouth.      Marland Kitchen glimepiride (AMARYL) 4 MG tablet Take 4 mg by mouth daily with breakfast.     . HYDROcodone-acetaminophen (LORTAB) 7.5-500 MG per tablet Take 1 tablet by mouth.      Marland Kitchen lisinopril (PRINIVIL,ZESTRIL) 10 MG tablet TAKE 1 TABLET BY MOUTH EVERY DAY FOR HIGH BP AND HEART  3  . metFORMIN (GLUCOPHAGE) 500 MG tablet Take 500 mg by mouth as needed.     . metoprolol (TOPROL-XL) 50 MG 24 hr tablet Take 50 mg by mouth.      Marland Kitchen omeprazole (PRILOSEC) 20 MG capsule Take 20 mg by mouth.      . simvastatin (ZOCOR) 40 MG tablet Take 40 mg by mouth daily at 6 PM.     . warfarin (COUMADIN) 5 MG tablet Take 1/2 tablet daily except 1 tablet on Mondays and Fridays 90 tablet 3   No current facility-administered medications for this visit.    Discontinued Meds:   There are no discontinued medications.  Patient Active Problem List   Diagnosis Date Noted  . OSA (obstructive sleep apnea) 01/01/2013  . Diabetes ([place]) 10/23/2012  . Aortic valve disorder 06/30/2010  . CVA (cerebral vascular accident) ([place])  06/30/2010  . S/P aortic valve replacement 06/30/2010  . HYPERLIPIDEMIA 09/29/2008  . OVERWEIGHT 09/29/2008  . HYPERTENSION 09/29/2008  . DEGENERATIVE JOINT DISEASE 09/29/2008    LABS    Component Value Date/Time   NA 136 11/20/2013 1441   NA 136 12/26/2007 0959   NA 138 11/25/2007 1408   K 4.4 11/20/2013 1441   K 4.4 12/26/2007 0959   K 4.5 11/25/2007 1408   CL 102 11/20/2013 1441   CL 105 12/26/2007 0959   CL 106 11/25/2007 1408   CO2 24 11/20/2013 1441   CO2 23 12/26/2007 0959   CO2 26 11/01/2007 1525   GLUCOSE 111* 11/20/2013 1441   GLUCOSE 104* 12/26/2007 0959   GLUCOSE 98 11/25/2007 1408   BUN 9 11/20/2013 1441   BUN 9 12/26/2007 0959   BUN 8 11/25/2007 1408   CREATININE 0.81 11/20/2013 1441   CREATININE 0.74 12/26/2007 0959   CREATININE 0.8 11/25/2007 1408   CREATININE 0.95 11/01/2007 1525   CALCIUM 9.4 11/20/2013 1441   CALCIUM 9.2 12/26/2007 0959   CALCIUM 8.9 11/01/2007 1525   GFRNONAA >60 12/26/2007 0959   GFRNONAA 58* 11/01/2007 1525   GFRNONAA 58*  10/03/2007 1358   GFRAA  12/26/2007 0959    >60        The eGFR has been calculated using the MDRD equation. This calculation has not been validated in all clinical   GFRAA  11/01/2007 1525    >60        The eGFR has been calculated using the MDRD equation. This calculation has not been validated in all clinical   GFRAA  10/03/2007 1358    >60        The eGFR has been calculated using the MDRD equation. This calculation has not been validated in all clinical   CMP     Component Value Date/Time   NA 136 11/20/2013 1441   K 4.4 11/20/2013 1441   CL 102 11/20/2013 1441   CO2 24 11/20/2013 1441   GLUCOSE 111* 11/20/2013 1441   BUN 9 11/20/2013 1441   CREATININE 0.81 11/20/2013 1441   CREATININE 0.74 12/26/2007 0959   CALCIUM 9.4 11/20/2013 1441   PROT 7.5 11/01/2007 1525   ALBUMIN 2.8* 11/01/2007 1525   AST 31 11/01/2007 1525   ALT 19 11/01/2007 1525   ALKPHOS 66 11/01/2007 1525   BILITOT  0.7 11/01/2007 1525   GFRNONAA >60 12/26/2007 0959   GFRAA  12/26/2007 0959    >60        The eGFR has been calculated using the MDRD equation. This calculation has not been validated in all clinical       Component Value Date/Time   WBC 8.2 03/01/2015 1125   WBC 7.8 12/26/2007 0959   WBC 8.1 11/25/2007 1400   HGB 14.6 03/01/2015 1125   HGB 12.2 12/26/2007 0959   HGB 12.9 11/25/2007 1408   HCT 43.8 03/01/2015 1125   HCT 36.5 12/26/2007 0959   HCT 38.0 11/25/2007 1408   MCV 102.3* 03/01/2015 1125   MCV 98.5 12/26/2007 0959   MCV 98.1 11/25/2007 1400    Lipid Panel  No results found for: CHOL, TRIG, HDL, CHOLHDL, VLDL, LDLCALC, LDLDIRECT  ABG    Component Value Date/Time   TCO2 24 11/25/2007 1408     No results found for: TSH BNP (last 3 results) No results for input(s): BNP in the last 8760 hours.  ProBNP (last 3 results) No results for input(s): PROBNP in the last 8760 hours.  Cardiac Panel (last 3 results) No results for input(s): CKTOTAL, CKMB, TROPONINI, RELINDX in the last 72 hours.  Iron/TIBC/Ferritin/ %Sat No results found for: IRON, TIBC, FERRITIN, IRONPCTSAT   EKG Orders placed or performed in visit on 10/08/14  . EKG 12-Lead     Prior Assessment and Plan Problem List as of 03/02/2015      Cardiovascular and Mediastinum   HYPERTENSION   Last Assessment & Plan 11/20/2013 Office Visit Written 11/20/2013  2:40 PM by Lendon Colonel, NP    Excellent control of blood pressure currently. We will make no changes in her medication regimen.      Aortic valve disorder   CVA (cerebral vascular accident) [facility])     Respiratory   OSA (obstructive sleep apnea)   Last Assessment & Plan 01/01/2013 Office Visit Edited 01/01/2013  4:04 PM by Lendon Colonel, NP    She was diagnosed with this in 2007 for abnormal sleep study. She is not using her CPAP as directed as her machine is in need of repair. I have printed out a copy of her sleep study in order to have  Dr. Karie Kirks see  this  and order another machine. She stated she needed a copy of this report in order to move forward with this.        Endocrine   Diabetes [facility])     Musculoskeletal and Integument   DEGENERATIVE JOINT DISEASE     Other   HYPERLIPIDEMIA   Last Assessment & Plan 01/01/2013 Office Visit Written 01/01/2013  4:03 PM by Lendon Colonel, NP    She is followed by Dr. Karie Kirks for labs. She will continue on Zocor 40 mg daily.      OVERWEIGHT   Last Assessment & Plan 01/01/2013 Office Visit Written 01/01/2013  4:03 PM by Lendon Colonel, NP    The patient's BMI is 33.5. She is advised on increasing her exercise, and calorie reduction. As this is a significant cardiovascular risk factor.      S/P aortic valve replacement   Last Assessment & Plan 11/20/2013 Office Visit Written 11/20/2013  2:40 PM by Lendon Colonel, NP    The patient will continue Coumadin therapy until specific date has been scheduled for tooth extraction. We have sent a letter to Dr. Lewanda Rife to inform him of our plans to bridge her with Lovenox and stop Coumadin based upon the date of his surgery. It is likely, however, that he may choose to do the tooth extraction. While she is on Coumadin and therefore, this will be moot point. Will await his recommendations have asked the patient or Dr. Lewanda Rife to call his concerning his plans. We will have a beam that drawn to evaluate for kidney function.          Imaging: Dg Tibia/fibula Right  03/01/2015  CLINICAL DATA:  74 year old female with history of pain right lower extremity since 02/23/2015. No history of injury. EXAM: RIGHT TIBIA AND FIBULA - 2 VIEW COMPARISON:  No priors. FINDINGS: There is no evidence of fracture or other focal bone lesions. Soft tissues are unremarkable. IMPRESSION: Negative. Electronically Signed   By: Vinnie Langton M.D.   On: 03/01/2015 12:03   Mr Knee Right Wo Contrast  03/01/2015  CLINICAL DATA:  Right knee pain.  No known  injury. EXAM: MRI OF THE RIGHT KNEE WITHOUT CONTRAST TECHNIQUE: Multiplanar, multisequence MR imaging of the knee was performed. No intravenous contrast was administered. COMPARISON:  Radiographs dated 03/01/2015 FINDINGS: MENISCI Medial meniscus: There is an extensive horizontal tear of the posterior horn of the medial meniscus with extension to the periphery at the posterior medial corner. Lateral meniscus:  Normal. LIGAMENTS Cruciates:  Normal. Collaterals:  Normal. CARTILAGE Patellofemoral: Multifocal grade 4 chondromalacia of the medial facet of the patella. Focal grade 4 chondromalacia of the medial aspect of the trochlear groove of the distal femur. Medial:  Slight diffuse thinning of the articular cartilage. Lateral:  Normal. Joint:  Small joint effusion. Popliteal Fossa:  Normal. Extensor Mechanism:  Normal. Bones:  Minimal tricompartmental marginal osteophytes. IMPRESSION: Horizontal tear of the posterior horn of the medial meniscus. 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 Diffuse thinning of the articular cartilage in the medial compartment. Electronically Signed   By: Lorriane Shire M.D.   On: 03/01/2015 16:07   US Venous Img Lower Unilateral Right  02/26/2015  CLINICAL DATA:  Right lower extremity pain for 3 days. Pain in the popliteal fossa and laterally about the knee. EXAM: RIGHT LOWER EXTREMITY VENOUS DOPPLER ULTRASOUND TECHNIQUE: Gray-scale sonography with graded compression, as well as color Doppler and duplex ultrasound were performed to evaluate the lower extremity deep venous systems  from the level of the common femoral vein and including the common femoral, femoral, profunda femoral, popliteal and calf veins including the posterior tibial, peroneal and gastrocnemius veins when visible. The superficial great saphenous vein was also interrogated. Spectral Doppler was utilized to evaluate flow at rest and with distal augmentation maneuvers in the common femoral, femoral and  popliteal veins. COMPARISON:  None. FINDINGS: Contralateral Common Femoral Vein: Respiratory phasicity is normal and symmetric with the symptomatic side. No evidence of thrombus. Normal compressibility. Common Femoral Vein: No evidence of thrombus. Normal compressibility, respiratory phasicity and response to augmentation. Saphenofemoral Junction: No evidence of thrombus. Normal compressibility and flow on color Doppler imaging. Profunda Femoral Vein: No evidence of thrombus. Normal compressibility and flow on color Doppler imaging. Femoral Vein: No evidence of thrombus. Normal compressibility, respiratory phasicity and response to augmentation. Popliteal Vein: No evidence of thrombus. Normal compressibility, respiratory phasicity and response to augmentation. Calf Veins: No evidence of thrombus. Normal compressibility and flow on color Doppler imaging. Superficial Great Saphenous Vein: No evidence of thrombus. Normal compressibility and flow on color Doppler imaging. Venous Reflux:  None. Other Findings:  None. IMPRESSION: No evidence of right lower extremity deep venous thrombosis. Electronically Signed   By: Jeb Levering M.D.   On: 02/26/2015 18:01   Dg Knee Complete 4 Views Right  03/01/2015  CLINICAL DATA:  74 year old female with right leg pain and swelling since 02/23/2015. No known injury. EXAM: RIGHT KNEE - COMPLETE 4+ VIEW COMPARISON:  No priors. FINDINGS: Four views of the right knee demonstrate no acute displaced fracture, subluxation, dislocation or soft tissue abnormality. There is mild multifocal joint space narrowing, subchondral sclerosis and osteophyte formation in a tricompartmental distribution, compatible with mild osteoarthritis. IMPRESSION: 1. No acute radiographic abnormality of the right knee. 2. Mild tricompartmental osteoarthritis. Electronically Signed   By: Vinnie Langton M.D.   On: 03/01/2015 12:02   Dg Femur, Min 2 Views Right  03/01/2015  CLINICAL DATA:  Right thigh and  lower leg pain and swelling for 6 days. No known injury. EXAM: RIGHT FEMUR 2 VIEWS COMPARISON:  None. FINDINGS: The bones appear adequately mineralized. There is no evidence acute fracture, dislocation or femoral head avascular necrosis. No significant arthropathic changes are demonstrated at the hip or knee. There is an area of endosteal sclerosis laterally in the distal femoral diaphysis which appears nonaggressive. There is a small knee joint effusion. IMPRESSION: No acute findings or explanation for the patient's symptoms. Endosteal sclerosis in the distal femoral diaphysis, likely incidental. Electronically Signed   By: Richardean Sale M.D.   On: 03/01/2015 12:02

## 2015-03-03 ENCOUNTER — Encounter (INDEPENDENT_AMBULATORY_CARE_PROVIDER_SITE_OTHER): Payer: Medicare Other | Admitting: *Deleted

## 2015-03-03 ENCOUNTER — Encounter: Payer: Medicare Other | Admitting: *Deleted

## 2015-03-23 ENCOUNTER — Other Ambulatory Visit (HOSPITAL_COMMUNITY): Payer: Self-pay | Admitting: Orthopaedic Surgery

## 2015-03-25 NOTE — Progress Notes (Signed)
This encounter was created in error - please disregard.

## 2015-03-31 ENCOUNTER — Ambulatory Visit (INDEPENDENT_AMBULATORY_CARE_PROVIDER_SITE_OTHER): Payer: Medicare Other | Admitting: *Deleted

## 2015-03-31 DIAGNOSIS — Z954 Presence of other heart-valve replacement: Secondary | ICD-10-CM | POA: Diagnosis not present

## 2015-03-31 DIAGNOSIS — I359 Nonrheumatic aortic valve disorder, unspecified: Secondary | ICD-10-CM | POA: Diagnosis not present

## 2015-03-31 DIAGNOSIS — I639 Cerebral infarction, unspecified: Secondary | ICD-10-CM | POA: Diagnosis not present

## 2015-03-31 DIAGNOSIS — Z952 Presence of prosthetic heart valve: Secondary | ICD-10-CM

## 2015-03-31 LAB — POCT INR: INR: 3.4

## 2015-04-02 ENCOUNTER — Encounter (HOSPITAL_COMMUNITY)
Admission: RE | Admit: 2015-04-02 | Discharge: 2015-04-02 | Disposition: A | Discharge status: Home / Self Care | Payer: Medicare Other | Source: Ambulatory Visit | Attending: Orthopaedic Surgery | Admitting: Orthopaedic Surgery

## 2015-04-02 ENCOUNTER — Encounter (HOSPITAL_COMMUNITY): Payer: Self-pay

## 2015-04-02 DIAGNOSIS — I10 Essential (primary) hypertension: Secondary | ICD-10-CM | POA: Insufficient documentation

## 2015-04-02 DIAGNOSIS — E119 Type 2 diabetes mellitus without complications: Secondary | ICD-10-CM | POA: Insufficient documentation

## 2015-04-02 DIAGNOSIS — Z01818 Encounter for other preprocedural examination: Secondary | ICD-10-CM | POA: Diagnosis present

## 2015-04-02 DIAGNOSIS — Z01812 Encounter for preprocedural laboratory examination: Secondary | ICD-10-CM | POA: Insufficient documentation

## 2015-04-02 DIAGNOSIS — Z7984 Long term (current) use of oral hypoglycemic drugs: Secondary | ICD-10-CM | POA: Diagnosis not present

## 2015-04-02 DIAGNOSIS — E785 Hyperlipidemia, unspecified: Secondary | ICD-10-CM | POA: Insufficient documentation

## 2015-04-02 DIAGNOSIS — G4733 Obstructive sleep apnea (adult) (pediatric): Secondary | ICD-10-CM | POA: Insufficient documentation

## 2015-04-02 DIAGNOSIS — Z79899 Other long term (current) drug therapy: Secondary | ICD-10-CM | POA: Insufficient documentation

## 2015-04-02 DIAGNOSIS — Z7901 Long term (current) use of anticoagulants: Secondary | ICD-10-CM | POA: Diagnosis not present

## 2015-04-02 DIAGNOSIS — Z8673 Personal history of transient ischemic attack (TIA), and cerebral infarction without residual deficits: Secondary | ICD-10-CM | POA: Insufficient documentation

## 2015-04-02 HISTORY — DX: Type 2 diabetes mellitus with diabetic neuropathy, unspecified: E11.40

## 2015-04-02 HISTORY — DX: Presence of spectacles and contact lenses: Z97.3

## 2015-04-02 HISTORY — DX: Cerebral infarction, unspecified: I63.9

## 2015-04-02 HISTORY — DX: Sleep apnea, unspecified: G47.30

## 2015-04-02 HISTORY — DX: Anxiety disorder, unspecified: F41.9

## 2015-04-02 LAB — CBC
HCT: 40.7 % (ref 36.0–46.0)
Hemoglobin: 13.6 g/dL (ref 12.0–15.0)
MCH: 34.3 pg — ABNORMAL HIGH (ref 26.0–34.0)
MCHC: 33.4 g/dL (ref 30.0–36.0)
MCV: 102.8 fL — ABNORMAL HIGH (ref 78.0–100.0)
PLATELETS: 142 10*3/uL — AB (ref 150–400)
RBC: 3.96 MIL/uL (ref 3.87–5.11)
RDW: 14.9 % (ref 11.5–15.5)
WBC: 7 10*3/uL (ref 4.0–10.5)

## 2015-04-02 LAB — GLUCOSE, CAPILLARY: GLUCOSE-CAPILLARY: 194 mg/dL — AB (ref 65–99)

## 2015-04-02 LAB — BASIC METABOLIC PANEL
ANION GAP: 9 (ref 5–15)
BUN: 9 mg/dL (ref 6–20)
CALCIUM: 9.4 mg/dL (ref 8.9–10.3)
CO2: 27 mmol/L (ref 22–32)
Chloride: 106 mmol/L (ref 101–111)
Creatinine, Ser: 0.79 mg/dL (ref 0.44–1.00)
Glucose, Bld: 125 mg/dL — ABNORMAL HIGH (ref 65–99)
Potassium: 4.4 mmol/L (ref 3.5–5.1)
Sodium: 142 mmol/L (ref 135–145)

## 2015-04-02 NOTE — Progress Notes (Signed)
Pt denies any acute cardiopulmonary issues. Pt stated that she was told to continue Coumadin and " do not take the night before surgery." Two voice messages were left on [employer] voice mail to clarify pt pre-op Coumadin instructions. Pt stated that she takes all of her medications at night except for PRN medications and diabetes medication. Pt chart forwarded to anesthesia to review cardiac history.

## 2015-04-02 NOTE — Pre-Procedure Instructions (Addendum)
TYNIAH HONTS  04/02/2015      [id number] - [place], [name] [address] Phone: [phone] Fax: [phone] [place], [name] [address] [place] [address] Phone: [phone] Fax: [phone]    Your procedure is scheduled on Tuesday, April 13, 2015  Report to [facility] Admitting at 11:00 A.M.  Call this number if you have problems the morning of surgery:  [phone]   Remember: Follow the doctors instructions regarding warfarin (COUMADIN)    Do not eat food or drink liquids after midnight Monday, April 12, 2015  Take these medicines the morning of surgery with A SIP OF WATER : if needed: ALPRAZolam Duanne Moron) for anxiety,  HYDROcodone-acetaminophen (LORTAB) for pain, Cetirizine for congestion,    Do not take oral diabetes medicines (pills) such as metFORMIN (GLUCOPHAGE) and glimepiride (AMARYL) the morning of surgery.  Stop taking Aspirin, vitamins and herbal medications such as Melatonin. Do not take any NSAIDs ie: Ibuprofen, Advil, Naproxen or any medication containing Aspirin; stop Wednesday, April 07, 2015. How to Manage Your Diabetes Before Surgery  Why is it important to control my blood sugar before and after surgery?   Improving blood sugar levels before and after surgery helps healing and can limit problems.  A way of improving blood sugar control is eating a healthy diet by:  - Eating less sugar and carbohydrates  - Increasing activity/exercise  - Talk with your doctor about reaching your blood sugar goals  High blood sugars (greater than 180 mg/dL) can raise your risk of infections and slow down your recovery so you will need to focus on controlling your diabetes during the weeks before surgery.  Make sure that the doctor who takes care of your diabetes knows about your planned surgery including the date and location.  How do I  manage my blood sugars before surgery?   Check your blood sugar at least 4 times a day, 2 days before surgery to make sure that they are not too high or low.   Check your blood sugar the morning of your surgery when you wake up and every 2 hours until you get to the Short-Stay unit.  If your blood sugar is less than 70 mg/dL, you will need to treat for low blood sugar by:  Treat a low blood sugar (less than 70 mg/dL) with 1/2 cup of clear juice (cranberry or apple), 4 glucose tablets, OR glucose gel.  Recheck blood sugar in 15 minutes after treatment (to make sure it is greater than 70 mg/dL).  If blood sugar is not greater than 70 mg/dL on re-check, call [phone] for further instructions.   Report your blood sugar to the Short-Stay nurse when you get to Short-Stay.  References:  [facility], 2007 "How to Manage your Diabetes Before and After Surgery".   Do not wear jewelry, make-up or nail polish.  Do not wear lotions, powders, or perfumes.  You may not wear deodorant.  Do not shave 48 hours prior to surgery.    Do not bring valuables to the hospital.  [facility] is not responsible for any belongings or valuables.  Contacts, dentures or bridgework may not be worn into surgery.  Leave your suitcase in the car.  After surgery it may be brought to your room.  For patients admitted to the hospital, discharge time will be determined by  your treatment team.  Patients discharged the day of surgery will not be allowed to drive home.   Name and phone number of your driver:   Special instructions: Shower the night before surgery and the morning of surgery with CHG.  Please read over the following fact sheets that you were given. Pain Booklet, Coughing and Deep Breathing and Surgical Site Infection Prevention

## 2015-04-02 NOTE — Progress Notes (Signed)
Spoke with Dondra Spry, nursing staff at MD's office to clarify  pt pre-op instructions for Coumadin and Lovenox bridge. According to [facility], both she and the surgeon explained to the pt that she would be expected to stop taking Coumadin and placed on Lovenox in the near future. Sherrie stated that she would f/u with pt again with detailed instructions for stopping Coumadin. Pt chart forwarded to anesthesia for review of pt cardiac history.

## 2015-04-02 NOTE — Progress Notes (Signed)
Pt stated that an echo, stress test and cardiac cath was completed at [facility] in 1996.

## 2015-04-03 LAB — HEMOGLOBIN A1C
HEMOGLOBIN A1C: 6.6 % — AB (ref 4.8–5.6)
MEAN PLASMA GLUCOSE: 143 mg/dL

## 2015-04-05 ENCOUNTER — Telehealth: Payer: Self-pay | Admitting: *Deleted

## 2015-04-05 NOTE — Telephone Encounter (Signed)
Please call Judeen Hammans @ above number to discuss Lovenox bridging. / tg

## 2015-04-05 NOTE — Progress Notes (Signed)
Anesthesia Chart Review:  Pt is 75 year old female scheduled for R knee arthroscopy with debridement and partial medial menisectomy on 04/13/2015 with Dr. Kathrynn Speed.   PCP is Dr. Leslie Andrea. Cardiologist is Dr. Bronson Ing, last office visit 10/08/14 with [name], NP.   PMH includes:  HTN, DM, OSA, stroke, hyperlipidemia. Never smoker. BMI 32. S/p AVR 1996.   Medications include: lasix, glimepiride, lisinopril, metformin, metoprolol, prilosec, simvastatin, coumadin. Sherrie in Dr. Trevor Mace office still trying to work out when pt to stop coumadin and when to start lovenox bridge. Will notify pt of plan when it is established.   Preoperative labs reviewed.  Glucose 125, hgbA1c 6.6. Pt will have PT/PTT DOS.   EKG 10/08/14: sinus bradycardia (56 bpm), 1st degree AV block.   Echo 10/21/03:  1. Technically adequate transesophageal echocardiographic study. 2. Mild left atrial enlargement; normal right atrial size. Grossly normal right ventricular size and function. 3. Normal mitral valve; mild annular calcification; mild regurgitation. 4. Small left atrial appendage; no echocardiographic contrast nor thrombus; minimal atrial septal aneurysm. 5. Contrast study normal without passage from the right heart to the left heart. 6. Mechanical prosthetic valve in the aortic position; mild insufficiency, which is normal for a device of this type. 7. Normal tricuspid and pulmonic valves. 8. Mild lipomatous hypertrophy of the intra-atrial septum superior to and inferior to a normal foramen ovale. 9. Very mild atherosclerosis of the thoracic aorta. 10.Normal internal dimension of the left ventricle; mild hypertrophy; normal regional and global function.  Pt has cardiac clearance from [name], NP (dated 03/02/15 in letters tab).   If no changes, I anticipate pt can proceed with surgery as scheduled.   Willeen Cass, FNP-BC [facility]/Anesthesiology Phone:  [phone] 04/05/2015 4:50 PM

## 2015-04-06 NOTE — Telephone Encounter (Signed)
Returned [employer] call with [employer]. She just wanted clarification about Lovenox and that patient would be receiving it. Patient's next appt was scheduled in February after her procedure and did not see any notes regarding setting up her bridge. Called pt and rescheduled her to 1/18 in [facility] in [place] for Lovenox bridge (hx AVR and stroke) before procedure 1/24.  SCr 0.79, wt = 93.2kg, CrCl >30 at 84mL/min. Pt will need BID dosing of Lovenox given history of AVR. Lovenox dosing will be 100mg  BID.

## 2015-04-07 ENCOUNTER — Ambulatory Visit (INDEPENDENT_AMBULATORY_CARE_PROVIDER_SITE_OTHER): Payer: Medicare Other | Admitting: Pharmacist

## 2015-04-07 DIAGNOSIS — I359 Nonrheumatic aortic valve disorder, unspecified: Secondary | ICD-10-CM | POA: Diagnosis not present

## 2015-04-07 DIAGNOSIS — I639 Cerebral infarction, unspecified: Secondary | ICD-10-CM

## 2015-04-07 DIAGNOSIS — I638 Other cerebral infarction: Secondary | ICD-10-CM

## 2015-04-07 DIAGNOSIS — Z954 Presence of other heart-valve replacement: Secondary | ICD-10-CM | POA: Diagnosis not present

## 2015-04-07 DIAGNOSIS — Z952 Presence of prosthetic heart valve: Secondary | ICD-10-CM

## 2015-04-07 DIAGNOSIS — I6389 Other cerebral infarction: Secondary | ICD-10-CM

## 2015-04-07 LAB — POCT INR: INR: 2.9

## 2015-04-07 MED ORDER — ENOXAPARIN SODIUM 100 MG/ML ~~LOC~~ SOLN: 100.0000 mg | Freq: Two times a day (BID) | SUBCUTANEOUS | Status: DC

## 2015-04-07 NOTE — Patient Instructions (Addendum)
1/18-Take Coumadin 1/2 tablet 1/19- No Coumadin or Lovenox  1/20- Lovenox 100mg  in PM 1/21- Lovenox 100mg  in AM and PM 1/22- Lovenox 100mg  in AM and PM 1/23- Lovenox 100mg  in AM only 1/24- Day of Procedure.  Do not take any Coumadin or Lovenox prior to your procedure.   1/25- Coumadin 1 tablet AND Lovenox 100mg  in AM and PM 1/26- Coumadin 1 tablet AND Lovenox 100mg  in AM and PM 1/27- Coumadin 1 tablet AND Lovenox 100mg  in AM and PM 1/28- Coumadin 1/2 tablet AND Lovenox 100mg  in AM and PM 1/29- Coumadin 1/2 tablet AND Lovenox 100mg  in AM and PM 1/30- Recheck INR

## 2015-04-12 MED ORDER — CEFAZOLIN SODIUM-DEXTROSE 2-3 GM-% IV SOLR
2.0000 g | INTRAVENOUS | Status: AC
  Administered 2015-04-13: 2 g via INTRAVENOUS
  Filled 2015-04-12: qty 50

## 2015-04-13 ENCOUNTER — Encounter (HOSPITAL_COMMUNITY): Payer: Self-pay | Admitting: General Practice

## 2015-04-13 ENCOUNTER — Ambulatory Visit (HOSPITAL_COMMUNITY): Payer: Medicare Other | Admitting: Anesthesiology

## 2015-04-13 ENCOUNTER — Observation Stay (HOSPITAL_COMMUNITY)
Admission: RE | Admit: 2015-04-13 | Discharge: 2015-04-14 | Disposition: A | Discharge status: Home / Self Care | Payer: Medicare Other | Source: Ambulatory Visit | Attending: Orthopaedic Surgery | Admitting: Orthopaedic Surgery

## 2015-04-13 ENCOUNTER — Encounter (HOSPITAL_COMMUNITY)
Admission: RE | Disposition: A | Discharge status: Home / Self Care | Payer: Self-pay | Source: Ambulatory Visit | Attending: Orthopaedic Surgery

## 2015-04-13 ENCOUNTER — Ambulatory Visit (HOSPITAL_COMMUNITY): Payer: Medicare Other | Admitting: Emergency Medicine

## 2015-04-13 DIAGNOSIS — Z9889 Other specified postprocedural states: Secondary | ICD-10-CM

## 2015-04-13 DIAGNOSIS — Z8673 Personal history of transient ischemic attack (TIA), and cerebral infarction without residual deficits: Secondary | ICD-10-CM | POA: Diagnosis not present

## 2015-04-13 DIAGNOSIS — Z7951 Long term (current) use of inhaled steroids: Secondary | ICD-10-CM | POA: Diagnosis not present

## 2015-04-13 DIAGNOSIS — S83241A Other tear of medial meniscus, current injury, right knee, initial encounter: Secondary | ICD-10-CM | POA: Diagnosis present

## 2015-04-13 DIAGNOSIS — Z7901 Long term (current) use of anticoagulants: Secondary | ICD-10-CM | POA: Insufficient documentation

## 2015-04-13 DIAGNOSIS — G4730 Sleep apnea, unspecified: Secondary | ICD-10-CM | POA: Insufficient documentation

## 2015-04-13 DIAGNOSIS — Z6832 Body mass index (BMI) 32.0-32.9, adult: Secondary | ICD-10-CM | POA: Insufficient documentation

## 2015-04-13 DIAGNOSIS — I10 Essential (primary) hypertension: Secondary | ICD-10-CM | POA: Insufficient documentation

## 2015-04-13 DIAGNOSIS — E1140 Type 2 diabetes mellitus with diabetic neuropathy, unspecified: Secondary | ICD-10-CM | POA: Diagnosis not present

## 2015-04-13 DIAGNOSIS — Z7984 Long term (current) use of oral hypoglycemic drugs: Secondary | ICD-10-CM | POA: Diagnosis not present

## 2015-04-13 DIAGNOSIS — Z79899 Other long term (current) drug therapy: Secondary | ICD-10-CM | POA: Diagnosis not present

## 2015-04-13 DIAGNOSIS — X501XXA Overexertion from prolonged static or awkward postures, initial encounter: Secondary | ICD-10-CM | POA: Diagnosis not present

## 2015-04-13 DIAGNOSIS — E785 Hyperlipidemia, unspecified: Secondary | ICD-10-CM | POA: Insufficient documentation

## 2015-04-13 DIAGNOSIS — M1990 Unspecified osteoarthritis, unspecified site: Secondary | ICD-10-CM | POA: Diagnosis not present

## 2015-04-13 HISTORY — PX: KNEE ARTHROSCOPY: SHX127

## 2015-04-13 HISTORY — PX: KNEE ARTHROSCOPY W/ MENISCECTOMY: SHX1879

## 2015-04-13 HISTORY — DX: Myoneural disorder, unspecified: G70.9

## 2015-04-13 LAB — GLUCOSE, CAPILLARY
GLUCOSE-CAPILLARY: 95 mg/dL (ref 65–99)
GLUCOSE-CAPILLARY: 145 mg/dL — AB (ref 65–99)
Glucose-Capillary: 89 mg/dL (ref 65–99)

## 2015-04-13 LAB — APTT: APTT: 34 s (ref 24–37)

## 2015-04-13 LAB — PROTIME-INR
INR: 1.24 (ref 0.00–1.49)
PROTHROMBIN TIME: 15.7 s — AB (ref 11.6–15.2)

## 2015-04-13 SURGERY — ARTHROSCOPY, KNEE
Anesthesia: General | Site: Knee | Laterality: Right

## 2015-04-13 MED ORDER — ALPRAZOLAM 0.5 MG PO TABS: 0.5000 mg | ORAL_TABLET | Freq: Four times a day (QID) | ORAL | Status: DC | PRN

## 2015-04-13 MED ORDER — METFORMIN HCL ER 500 MG PO TB24
1000.0000 mg | ORAL_TABLET | Freq: Every day | ORAL | Status: DC
  Administered 2015-04-14: 1000 mg via ORAL
  Filled 2015-04-13: qty 2

## 2015-04-13 MED ORDER — LACTATED RINGERS IV SOLN
INTRAVENOUS | Status: DC
  Administered 2015-04-13: 11:00:00 via INTRAVENOUS

## 2015-04-13 MED ORDER — METHOCARBAMOL 500 MG PO TABS
500.0000 mg | ORAL_TABLET | Freq: Four times a day (QID) | ORAL | Status: DC | PRN
  Administered 2015-04-13 (×2): 500 mg via ORAL
  Filled 2015-04-13: qty 1

## 2015-04-13 MED ORDER — FENTANYL CITRATE (PF) 100 MCG/2ML IJ SOLN
INTRAMUSCULAR | Status: DC | PRN
  Administered 2015-04-13: 75 ug via INTRAVENOUS
  Administered 2015-04-13: 25 ug via INTRAVENOUS
  Administered 2015-04-13: 50 ug via INTRAVENOUS

## 2015-04-13 MED ORDER — METOCLOPRAMIDE HCL 5 MG/ML IJ SOLN: 5.0000 mg | Freq: Three times a day (TID) | INTRAMUSCULAR | Status: DC | PRN

## 2015-04-13 MED ORDER — MORPHINE SULFATE (PF) 4 MG/ML IV SOLN
INTRAVENOUS | Status: AC
  Filled 2015-04-13: qty 1

## 2015-04-13 MED ORDER — INSULIN ASPART 100 UNIT/ML ~~LOC~~ SOLN: 0.0000 [IU] | Freq: Every day | SUBCUTANEOUS | Status: DC

## 2015-04-13 MED ORDER — LISINOPRIL 10 MG PO TABS
10.0000 mg | ORAL_TABLET | Freq: Every day | ORAL | Status: DC
  Administered 2015-04-13 – 2015-04-14 (×2): 10 mg via ORAL
  Filled 2015-04-13 (×2): qty 1

## 2015-04-13 MED ORDER — ENOXAPARIN SODIUM 100 MG/ML ~~LOC~~ SOLN
100.0000 mg | Freq: Two times a day (BID) | SUBCUTANEOUS | Status: DC
  Filled 2015-04-13 (×2): qty 1

## 2015-04-13 MED ORDER — MORPHINE SULFATE (PF) 4 MG/ML IV SOLN
INTRAVENOUS | Status: DC | PRN
  Administered 2015-04-13: 4 mg via SUBCUTANEOUS

## 2015-04-13 MED ORDER — ENOXAPARIN SODIUM 100 MG/ML ~~LOC~~ SOLN
90.0000 mg | Freq: Two times a day (BID) | SUBCUTANEOUS | Status: DC
  Administered 2015-04-13 – 2015-04-14 (×2): 90 mg via SUBCUTANEOUS
  Filled 2015-04-13 (×3): qty 1

## 2015-04-13 MED ORDER — BISACODYL 5 MG PO TBEC: 5.0000 mg | DELAYED_RELEASE_TABLET | Freq: Every day | ORAL | Status: DC | PRN

## 2015-04-13 MED ORDER — OXYCODONE HCL 5 MG PO TABS
5.0000 mg | ORAL_TABLET | ORAL | Status: DC | PRN
  Administered 2015-04-13 – 2015-04-14 (×5): 10 mg via ORAL
  Filled 2015-04-13 (×4): qty 2

## 2015-04-13 MED ORDER — FENTANYL CITRATE (PF) 250 MCG/5ML IJ SOLN
INTRAMUSCULAR | Status: AC
  Filled 2015-04-13: qty 5

## 2015-04-13 MED ORDER — GABAPENTIN 100 MG PO CAPS
100.0000 mg | ORAL_CAPSULE | Freq: Every day | ORAL | Status: DC
  Administered 2015-04-14: 100 mg via ORAL
  Filled 2015-04-13: qty 1

## 2015-04-13 MED ORDER — METOCLOPRAMIDE HCL 5 MG PO TABS: 5.0000 mg | ORAL_TABLET | Freq: Three times a day (TID) | ORAL | Status: DC | PRN

## 2015-04-13 MED ORDER — GLIMEPIRIDE 4 MG PO TABS: 4.0000 mg | ORAL_TABLET | Freq: Every day | ORAL | Status: DC | PRN

## 2015-04-13 MED ORDER — METHOCARBAMOL 500 MG PO TABS
ORAL_TABLET | ORAL | Status: AC
  Filled 2015-04-13: qty 1

## 2015-04-13 MED ORDER — SODIUM CHLORIDE 0.9 % IR SOLN
Status: DC | PRN
  Administered 2015-04-13: 3000 mL

## 2015-04-13 MED ORDER — HYDROMORPHONE HCL 1 MG/ML IJ SOLN: 0.2500 mg | INTRAMUSCULAR | Status: DC | PRN

## 2015-04-13 MED ORDER — OXYCODONE HCL 5 MG PO TABS
ORAL_TABLET | ORAL | Status: AC
  Filled 2015-04-13: qty 2

## 2015-04-13 MED ORDER — ACETAMINOPHEN 650 MG RE SUPP: 650.0000 mg | Freq: Four times a day (QID) | RECTAL | Status: DC | PRN

## 2015-04-13 MED ORDER — SIMVASTATIN 40 MG PO TABS
40.0000 mg | ORAL_TABLET | Freq: Every day | ORAL | Status: DC
  Administered 2015-04-13: 40 mg via ORAL
  Filled 2015-04-13: qty 1

## 2015-04-13 MED ORDER — MELATONIN 3 MG PO TABS
3.0000 mg | ORAL_TABLET | Freq: Every day | ORAL | Status: DC
  Administered 2015-04-13: 3 mg via ORAL
  Filled 2015-04-13 (×2): qty 1

## 2015-04-13 MED ORDER — BUPIVACAINE HCL (PF) 0.5 % IJ SOLN
INTRAMUSCULAR | Status: AC
  Filled 2015-04-13: qty 30

## 2015-04-13 MED ORDER — ACETAMINOPHEN 325 MG PO TABS: 650.0000 mg | ORAL_TABLET | Freq: Four times a day (QID) | ORAL | Status: DC | PRN

## 2015-04-13 MED ORDER — ONDANSETRON HCL 4 MG/2ML IJ SOLN
4.0000 mg | Freq: Four times a day (QID) | INTRAMUSCULAR | Status: DC | PRN
  Administered 2015-04-14: 4 mg via INTRAVENOUS
  Filled 2015-04-13: qty 2

## 2015-04-13 MED ORDER — HYDROMORPHONE HCL 1 MG/ML IJ SOLN
0.5000 mg | INTRAMUSCULAR | Status: DC | PRN
  Administered 2015-04-13: 0.5 mg via INTRAVENOUS
  Filled 2015-04-13: qty 1

## 2015-04-13 MED ORDER — LIDOCAINE HCL (CARDIAC) 20 MG/ML IV SOLN
INTRAVENOUS | Status: DC | PRN
  Administered 2015-04-13: 80 mg via INTRAVENOUS

## 2015-04-13 MED ORDER — ONDANSETRON HCL 4 MG PO TABS: 4.0000 mg | ORAL_TABLET | Freq: Four times a day (QID) | ORAL | Status: DC | PRN

## 2015-04-13 MED ORDER — PROPOFOL 10 MG/ML IV BOLUS
INTRAVENOUS | Status: DC | PRN
Start: 2015-04-13 — End: 2015-04-13
  Administered 2015-04-13: 150 mg via INTRAVENOUS

## 2015-04-13 MED ORDER — BUPIVACAINE HCL (PF) 0.5 % IJ SOLN
INTRAMUSCULAR | Status: DC | PRN
  Administered 2015-04-13: 20 mL via INTRA_ARTICULAR

## 2015-04-13 MED ORDER — PROMETHAZINE HCL 25 MG/ML IJ SOLN: 6.2500 mg | INTRAMUSCULAR | Status: DC | PRN

## 2015-04-13 MED ORDER — DIPHENHYDRAMINE HCL 12.5 MG/5ML PO ELIX: 12.5000 mg | ORAL_SOLUTION | ORAL | Status: DC | PRN

## 2015-04-13 MED ORDER — METHOCARBAMOL 1000 MG/10ML IJ SOLN
500.0000 mg | Freq: Four times a day (QID) | INTRAVENOUS | Status: DC | PRN
  Filled 2015-04-13: qty 5

## 2015-04-13 MED ORDER — ACETAMINOPHEN 500 MG PO TABS: 500.0000 mg | ORAL_TABLET | Freq: Every day | ORAL | Status: DC | PRN

## 2015-04-13 MED ORDER — LIDOCAINE HCL (CARDIAC) 20 MG/ML IV SOLN
INTRAVENOUS | Status: AC
  Filled 2015-04-13: qty 5

## 2015-04-13 MED ORDER — WARFARIN SODIUM 5 MG PO TABS
5.0000 mg | ORAL_TABLET | Freq: Once | ORAL | Status: AC
  Administered 2015-04-13: 5 mg via ORAL
  Filled 2015-04-13: qty 1

## 2015-04-13 MED ORDER — INSULIN ASPART 100 UNIT/ML ~~LOC~~ SOLN: 0.0000 [IU] | Freq: Three times a day (TID) | SUBCUTANEOUS | Status: DC

## 2015-04-13 MED ORDER — LACTATED RINGERS IV SOLN
INTRAVENOUS | Status: DC | PRN
  Administered 2015-04-13: 12:00:00 via INTRAVENOUS

## 2015-04-13 MED ORDER — SODIUM CHLORIDE 0.9 % IV SOLN
INTRAVENOUS | Status: DC
  Administered 2015-04-13: 16:00:00 via INTRAVENOUS

## 2015-04-13 MED ORDER — FLUTICASONE PROPIONATE 50 MCG/ACT NA SUSP
2.0000 | Freq: Every day | NASAL | Status: DC
Start: 2015-04-13 — End: 2015-04-14
  Administered 2015-04-13 – 2015-04-14 (×2): 2 via NASAL
  Filled 2015-04-13: qty 16

## 2015-04-13 MED ORDER — METOPROLOL SUCCINATE ER 50 MG PO TB24
50.0000 mg | ORAL_TABLET | Freq: Every day | ORAL | Status: DC
  Administered 2015-04-13 – 2015-04-14 (×2): 50 mg via ORAL
  Filled 2015-04-13 (×2): qty 1

## 2015-04-13 MED ORDER — FUROSEMIDE 40 MG PO TABS: 40.0000 mg | ORAL_TABLET | Freq: Every day | ORAL | Status: DC | PRN

## 2015-04-13 MED ORDER — PANTOPRAZOLE SODIUM 40 MG PO TBEC
40.0000 mg | DELAYED_RELEASE_TABLET | Freq: Every day | ORAL | Status: DC
  Administered 2015-04-13 – 2015-04-14 (×2): 40 mg via ORAL
  Filled 2015-04-13 (×2): qty 1

## 2015-04-13 MED ORDER — WARFARIN - PHARMACIST DOSING INPATIENT: Freq: Every day | Status: DC

## 2015-04-13 SURGICAL SUPPLY — 34 items
BANDAGE ACE 6X5 VEL STRL LF (GAUZE/BANDAGES/DRESSINGS) ×2 IMPLANT
BANDAGE ELASTIC 6 VELCRO ST LF (GAUZE/BANDAGES/DRESSINGS) ×2 IMPLANT
BLADE CUTTER GATOR 3.5 (BLADE) ×2 IMPLANT
BLADE SURG ROTATE 9660 (MISCELLANEOUS) IMPLANT
DRAPE ARTHROSCOPY W/POUCH 114 (DRAPES) ×2 IMPLANT
DRAPE U-SHAPE 47X51 STRL (DRAPES) ×2 IMPLANT
DRSG PAD ABDOMINAL 8X10 ST (GAUZE/BANDAGES/DRESSINGS) IMPLANT
DURAPREP 26ML APPLICATOR (WOUND CARE) ×2 IMPLANT
GAUZE SPONGE 4X4 12PLY STRL (GAUZE/BANDAGES/DRESSINGS) ×2 IMPLANT
GAUZE XEROFORM 1X8 LF (GAUZE/BANDAGES/DRESSINGS) ×2 IMPLANT
GLOVE BIOGEL PI IND STRL 8 (GLOVE) ×3 IMPLANT
GLOVE BIOGEL PI INDICATOR 8 (GLOVE) ×3
GLOVE ORTHO TXT STRL SZ7.5 (GLOVE) ×2 IMPLANT
GLOVE SURG ORTHO 8.0 STRL STRW (GLOVE) ×2 IMPLANT
GLOVE SURG SS PI 8.0 STRL IVOR (GLOVE) ×2 IMPLANT
GOWN STRL REUS W/ TWL LRG LVL3 (GOWN DISPOSABLE) ×2 IMPLANT
GOWN STRL REUS W/ TWL XL LVL3 (GOWN DISPOSABLE) ×4 IMPLANT
GOWN STRL REUS W/TWL LRG LVL3 (GOWN DISPOSABLE) ×2
GOWN STRL REUS W/TWL XL LVL3 (GOWN DISPOSABLE) ×4
KIT ROOM TURNOVER OR (KITS) ×2 IMPLANT
MANIFOLD NEPTUNE II (INSTRUMENTS) IMPLANT
PACK ARTHROSCOPY DSU (CUSTOM PROCEDURE TRAY) ×2 IMPLANT
PAD ABD 8X10 STRL (GAUZE/BANDAGES/DRESSINGS) ×2 IMPLANT
PAD ARMBOARD 7.5X6 YLW CONV (MISCELLANEOUS) ×4 IMPLANT
PADDING CAST COTTON 6X4 STRL (CAST SUPPLIES) ×2 IMPLANT
SET ARTHROSCOPY TUBING (MISCELLANEOUS) ×1
SET ARTHROSCOPY TUBING LN (MISCELLANEOUS) ×1 IMPLANT
SPONGE GAUZE 4X4 12PLY STER LF (GAUZE/BANDAGES/DRESSINGS) ×2 IMPLANT
SPONGE LAP 4X18 X RAY DECT (DISPOSABLE) ×2 IMPLANT
SUT ETHILON 3 0 PS 1 (SUTURE) ×2 IMPLANT
TOWEL OR 17X24 6PK STRL BLUE (TOWEL DISPOSABLE) ×2 IMPLANT
TOWEL OR 17X26 10 PK STRL BLUE (TOWEL DISPOSABLE) ×2 IMPLANT
WAND HAND CNTRL MULTIVAC 90 (MISCELLANEOUS) IMPLANT
WATER STERILE IRR 1000ML POUR (IV SOLUTION) ×2 IMPLANT

## 2015-04-13 NOTE — Transfer of Care (Signed)
Immediate Anesthesia Transfer of Care Note  Patient: Mary [name]  Procedure(s) Performed: Procedure(s): RIGHT KNEE ARTHROSCOPY WITH DEBRIDEMENT AND PARTIAL MEDIAL MENISCECTOMY (Right)  Patient Location: PACU  Anesthesia Type:General  Level of Consciousness: awake, alert  and oriented  Airway & Oxygen Therapy: Patient Spontanous Breathing and Patient connected to nasal cannula oxygen  Post-op Assessment: Report given to RN, Post -op Vital signs reviewed and stable and Patient moving all extremities  Post vital signs: Reviewed and stable  Last Vitals:  Filed Vitals:   04/13/15 1054  BP: 173/47  Pulse: 60  Temp: 36.3 C  Resp: 18    Complications: No apparent anesthesia complications

## 2015-04-13 NOTE — Anesthesia Procedure Notes (Signed)
Procedure Name: LMA Insertion Date/Time: 04/13/2015 12:27 PM Performed by: Izora Gala Pre-anesthesia Checklist: Patient identified, Emergency Drugs available, Suction available and Patient being monitored Patient Re-evaluated:Patient Re-evaluated prior to inductionOxygen Delivery Method: Circle system utilized Preoxygenation: Pre-oxygenation with 100% oxygen Intubation Type: IV induction Ventilation: Mask ventilation without difficulty LMA: LMA inserted LMA Size: 4.0 Number of attempts: 1 Placement Confirmation: positive ETCO2 Tube secured with: Tape

## 2015-04-13 NOTE — Progress Notes (Signed)
Pt arrived to unit alert and orientated, dressing was dry and intact, resting comforable with no complaints, ate meal without a problem. Husband visiting

## 2015-04-13 NOTE — H&P (Signed)
Mary [name] is an 75 y.o. female.   Chief Complaint:   Right knee pain with locking and catching HPI:   75 yo female with acute right knee pain with locking and catching.  Has acute meniscal tear on MRI.  Failed rest, ice, heat, and injections.  Surgery has been recommended at this point and the risk and benefits explained in detail.  Past Medical History  Diagnosis Date  . DJD (degenerative joint disease)   . Overweight(278.02)   . Hyperlipidemia   . Hypertension   . Shortness of breath dyspnea     with exertion  . Diabetes mellitus without complication ([place])   . 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 Sleep apnea   . Anxiety   . Wears glasses   . Diabetic neuropathy ([place])   . Diabetic neuropathy [facility])     Past Surgical History  Procedure Laterality Date  . Ankle surgery      Left tendon repair  . Aortic valve replacement  03/1994  . Lumbar fusion    . Cervical spine surgery    . Hemorrhoid surgery    . Cardiac catheterization      [place]  . Colonoscopy      Family History  Problem Relation Age of Onset  . Pneumonia Sister   . Dementia Mother    Social History:  reports that she has never smoked. She has never used smokeless tobacco. She reports that she does not drink alcohol or use illicit drugs.  Allergies:  Allergies  Allergen Reactions  . Tape Itching    Redness, Please use "paper" tape only    Medications Prior to Admission  Medication Sig Dispense Refill  . acetaminophen (TYLENOL) 500 MG tablet Take 500-1,000 mg by mouth daily as needed for moderate pain.    Marland Kitchen ALPRAZolam (XANAX) 0.5 MG tablet Take 0.5 mg by mouth 4 (four) times daily as needed for anxiety or sleep (every night for sleep).     . bisacodyl (DULCOLAX) 5 MG EC tablet Take 5 mg by mouth daily as needed for moderate constipation.    . cetirizine (ZYRTEC) 10 MG tablet Take 10 mg by mouth daily as needed for allergies.   11  . [facility] MELATONIN 3 MG TABS Take 3 mg by mouth at bedtime.  11  .  enoxaparin (LOVENOX) 100 MG/ML injection Inject 1 mL (100 mg total) into the skin every 12 (twelve) hours. 20 Syringe 0  . ergocalciferol (VITAMIN D2) 50000 units capsule Take 50,000 Units by mouth every Friday.    . fluticasone (FLONASE) 50 MCG/ACT nasal spray Place 2 sprays into both nostrils daily.   11  . furosemide (LASIX) 40 MG tablet Take 40 mg by mouth daily as needed for fluid.    Marland Kitchen gabapentin (NEURONTIN) 100 MG capsule Take 100 mg by mouth daily.  11  . glimepiride (AMARYL) 4 MG tablet Take 4 mg by mouth daily as needed (for high blood sugar).     Marland Kitchen HYDROcodone-acetaminophen (NORCO) 10-325 MG tablet Take 2 tablets by mouth daily.  0  . lisinopril (PRINIVIL,ZESTRIL) 10 MG tablet TAKE 1 TABLET BY MOUTH EVERY DAY FOR HIGH BP AND HEART  3  . metFORMIN (GLUCOPHAGE-XR) 500 MG 24 hr tablet Take 1,000 mg by mouth every morning. As needed for high blood sugar  3  . metoprolol (TOPROL-XL) 50 MG 24 hr tablet Take 50 mg by mouth daily.     Marland Kitchen omeprazole (PRILOSEC) 40  MG capsule Take 40 mg by mouth daily.  0  . simvastatin (ZOCOR) 40 MG tablet Take 40 mg by mouth daily at 6 PM.     . warfarin (COUMADIN) 5 MG tablet Take 1/2 tablet daily except 1 tablet on Mondays and Fridays (Patient taking differently: Take 2.5-5 mg by mouth daily at 6 PM. Take 1/2 tablet daily except 1 tablet on Fridays) 90 tablet 3    Results for orders placed or performed during the hospital encounter of 04/13/15 (from the past 48 hour(s))  Glucose, capillary     Status: None   Collection Time: 04/13/15 10:59 AM  Result Value Ref Range   Glucose-Capillary 95 65 - 99 mg/dL  APTT     Status: None   Collection Time: 04/13/15 11:30 AM  Result Value Ref Range   aPTT 34 24 - 37 seconds  Protime-INR     Status: Abnormal   Collection Time: 04/13/15 11:30 AM  Result Value Ref Range   Prothrombin Time 15.7 (H) 11.6 - 15.2 seconds   INR 1.24 0.00 - 1.49   No results found.  Review of Systems  Musculoskeletal: Positive for joint  pain.  All other systems reviewed and are negative.   Blood pressure 173/47, pulse 60, temperature 97.3 F (36.3 C), temperature source Oral, resp. rate 18, height 5\' 7"  (1.702 m), weight 93.078 kg (205 lb 3.2 oz), SpO2 98 %. Physical Exam  Constitutional: She is oriented to person, place, and time. She appears well-developed and well-nourished.  HENT:  Head: Normocephalic and atraumatic.  Eyes: EOM are normal. Pupils are equal, round, and reactive to light.  Neck: Normal range of motion. Neck supple.  Cardiovascular: Normal rate and regular rhythm.   Respiratory: Effort normal and breath sounds normal.  GI: Soft. Bowel sounds are normal.  Musculoskeletal:       Right knee: She exhibits decreased range of motion, swelling and effusion. Tenderness found. Medial joint line and lateral joint line tenderness noted.  Neurological: She is alert and oriented to person, place, and time.  Skin: Skin is warm and dry.  Psychiatric: She has a normal mood and affect.     Assessment/Plan Right knee with pain and symptomatic medial meniscal tear 1)  To the OR today for a right knee arthroscopy and a partial medial meniscectomy  Mary [name] Y 04/13/2015, 12:16 PM

## 2015-04-13 NOTE — Op Note (Signed)
Mary [name], Mary [name] NO.:  [id number]  MEDICAL RECORD NO.:  [id number]  LOCATION:  [facility]                         FACILITY:  [facility]  PHYSICIAN:  Lind Guest. Ninfa Linden, M.D.DATE OF BIRTH:  September 11, 1940  DATE OF PROCEDURE:  04/13/2015 DATE OF DISCHARGE:                              OPERATIVE REPORT   POSTOPERATIVE DIAGNOSIS:  Right knee symptomatic medial meniscal tear.  PREOPERATIVE DIAGNOSIS:  Right knee symptomatic medial meniscal tear.  PROCEDURE:  Right knee arthroscopy with debridement and partial medial meniscectomy.  SURGEON:  Lind Guest. Ninfa Linden, M.D.  ASSISTANT:  Erskine Emery, PA-C.  ANESTHESIA: 1. General. 2. Local with mixture of 0.5% plain Marcaine and morphine.  BLOOD LOSS:  Minimal.  COMPLICATIONS:  None.  INDICATIONS:  Ms. Rumford is a 75 year old female, on chronic Coumadin therapy, who sustained a twisting injury to her right knee and acute pain.  She was seen at [facility] in [place] and they found acute medial meniscal tear.  We tried conservative treatment with an injection in her knee, rest, ice, heat, anti-inflammatories, and quad strengthening and exercises, but her pain is severe and she is developed quite a bit of swelling in her knee.  This is the point where she wants to proceed with an arthroscopic intervention.  The risks and benefits were explained to her in detail.  We were able to bridge her off the Coumadin to Lovenox and will start her Coumadin again tonight, keeping her overnight.  She understands the risks and benefits of the surgery and does wish to proceed.  PROCEDURE DESCRIPTION:  After informed consent was obtained, appropriate right knee was marked.  She was brought to the operating room and placed supine on the operating table.  General anesthesia was then obtained. Her right leg was prepped and draped from the thigh down the ankle with DuraPrep and sterile drapes including a sterile  stockinette.  With the lateral leg post utilized and the right leg in table raise, we flexed the right knee off the side of the table.  An anterolateral arthroscopy portal was then made and a large effusion was drained from the knee.  We then placed a camera in the knee, I went to the medial compartment and then found degenerative changes on the medial femoral condyle, significant inflamed tissue throughout the knee and a large medial meniscal tear.  Using up-cutting biters, straight biters and arthroscopic shaver, we performed a partial medial meniscectomy.  We then assessed the intercondylar area and found the ACL and PCL to be intact.  With the knee in the figure 4 position, there were only minimal degenerative changes on the lateral compartment.  Finally, the superior suprapatellar space in the trochlear groove had some mild cartilage changes and we debrided this area.  We then allowed fluid to lavage through the knee and then drained all fluid from the knee.  We closed the portal sites with interrupted nylon suture.  Xeroform and well- padded sterile dressing were applied.  We then inserted a mixture of morphine and Marcaine into the knee.  She was awakened, extubated and taken to the recovery room in stable condition.  All final counts were correct.  There were no complications noted.  Of note, Erskine Emery, PA- C assisted during the case, his assistance was helpful in positioning the patient and positioning of the leg during surgery.     Lind Guest. Ninfa Linden, M.D.     CYB/MEDQ  D:  04/13/2015  T:  04/13/2015  Job:  [id number]

## 2015-04-13 NOTE — Anesthesia Preprocedure Evaluation (Addendum)
Anesthesia Evaluation  Patient identified by MRN, date of birth, ID band Patient awake    Reviewed: Allergy & Precautions, NPO status , Patient's Chart, lab work & pertinent test results  History of Anesthesia Complications Negative for: history of anesthetic complications  Airway Mallampati: I  TM Distance: >3 FB Neck ROM: Full    Dental   Pulmonary shortness of breath, sleep apnea ,    breath sounds clear to auscultation       Cardiovascular hypertension, + Valvular Problems/Murmurs  Rhythm:Regular Rate:Normal  AVR mechanical   Neuro/Psych CVA    GI/Hepatic   Endo/Other  diabetesMorbid obesity  Renal/GU      Musculoskeletal  (+) Arthritis ,   Abdominal   Peds  Hematology   Anesthesia Other Findings   Reproductive/Obstetrics                            Anesthesia Physical Anesthesia Plan  ASA: III  Anesthesia Plan: General   Post-op Pain Management:    Induction: Intravenous  Airway Management Planned: LMA  Additional Equipment:   Intra-op Plan:   Post-operative Plan:   Informed Consent: I have reviewed the patients History and Physical, chart, labs and discussed the procedure including the risks, benefits and alternatives for the proposed anesthesia with the patient or authorized representative who has indicated his/her understanding and acceptance.   Dental advisory given  Plan Discussed with: CRNA and Surgeon  Anesthesia Plan Comments:         Anesthesia Quick Evaluation

## 2015-04-13 NOTE — Brief Op Note (Signed)
04/13/2015  12:58 PM  PATIENT:  Mary [name]  75 y.o. female  PRE-OPERATIVE DIAGNOSIS:  right knee medial meniscal tear  POST-OPERATIVE DIAGNOSIS:  right knee medial meniscal tear  PROCEDURE:  Procedure(s): RIGHT KNEE ARTHROSCOPY WITH DEBRIDEMENT AND PARTIAL MEDIAL MENISCECTOMY (Right)  SURGEON:  Surgeon(s) and Role:    * Mcarthur Rossetti, MD - Primary  PHYSICIAN ASSISTANT: Benita Stabile, PA-C  ANESTHESIA:   local and general  EBL:   minimal  LOCAL MEDICATIONS USED:  MARCAINE     COUNTS:  YES  TOURNIQUET:  none  DICTATION: .Other Dictation: Dictation Number [id number]  PLAN OF CARE: Admit to inpatient   PATIENT DISPOSITION:  PACU - hemodynamically stable.   Delay start of Pharmacological VTE agent (>24hrs) due to surgical blood loss or risk of bleeding: no

## 2015-04-13 NOTE — Progress Notes (Signed)
ANTICOAGULATION CONSULT NOTE - Initial Consult  Pharmacy Consult for Coumadin Indication: Heart valve  Allergies  Allergen Reactions  . Tape Itching    Redness, Please use "paper" tape only    Patient Measurements: Height: 5\' 7"  (170.2 cm) Weight: 205 lb 3.2 oz (93.078 kg) IBW/kg (Calculated) : 61.6 Heparin Dosing Weight:    Vital Signs: Temp: 97.9 F (36.6 C) (01/24 1454) Temp Source: Oral (01/24 1454) BP: 173/64 mmHg (01/24 1454) Pulse Rate: 75 (01/24 1454)  Labs:  Recent Labs  04/13/15 1130  APTT 34  LABPROT 15.7*  INR 1.24    Estimated Creatinine Clearance: 70.1 mL/min (by C-G formula based on Cr of 0.79).   Medical History: Past Medical History  Diagnosis Date  . DJD (degenerative joint disease)   . Overweight(278.02)   . Hyperlipidemia   . Hypertension   . Shortness of breath dyspnea     with exertion  . Diabetes mellitus without complication ([place])   . 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 Sleep apnea   . Anxiety   . Wears glasses   . Diabetic neuropathy ([place])   . Diabetic neuropathy ([facility])     Medications:  Prescriptions prior to admission  Medication Sig Dispense Refill Last Dose  . acetaminophen (TYLENOL) 500 MG tablet Take 500-1,000 mg by mouth daily as needed for moderate pain.   Past Week at Unknown time  . ALPRAZolam (XANAX) 0.5 MG tablet Take 0.5 mg by mouth 4 (four) times daily as needed for anxiety or sleep (every night for sleep).    04/13/2015 at Unknown time  . bisacodyl (DULCOLAX) 5 MG EC tablet Take 5 mg by mouth daily as needed for moderate constipation.   Past Week at Unknown time  . cetirizine (ZYRTEC) 10 MG tablet Take 10 mg by mouth daily as needed for allergies.   11 04/12/2015 at Unknown time  . [facility]S MELATONIN 3 MG TABS Take 3 mg by mouth at bedtime.  11   . enoxaparin (LOVENOX) 100 MG/ML injection Inject 1 mL (100 mg total) into the skin every 12 (twelve) hours. 20 Syringe 0 Past Week at Unknown time  . ergocalciferol (VITAMIN  D2) 50000 units capsule Take 50,000 Units by mouth every Friday.   Past Week at Unknown time  . fluticasone (FLONASE) 50 MCG/ACT nasal spray Place 2 sprays into both nostrils daily.   11 04/12/2015 at Unknown time  . furosemide (LASIX) 40 MG tablet Take 40 mg by mouth daily as needed for fluid.   04/12/2015 at Unknown time  . gabapentin (NEURONTIN) 100 MG capsule Take 100 mg by mouth daily.  11 04/13/2015 at Unknown time  . glimepiride (AMARYL) 4 MG tablet Take 4 mg by mouth daily as needed (for high blood sugar).    04/12/2015 at Unknown time  . HYDROcodone-acetaminophen (NORCO) 10-325 MG tablet Take 2 tablets by mouth daily.  0 04/13/2015 at Unknown time  . lisinopril (PRINIVIL,ZESTRIL) 10 MG tablet TAKE 1 TABLET BY MOUTH EVERY DAY FOR HIGH BP AND HEART  3 04/12/2015 at Unknown time  . metFORMIN (GLUCOPHAGE-XR) 500 MG 24 hr tablet Take 1,000 mg by mouth every morning. As needed for high blood sugar  3 04/12/2015 at Unknown time  . metoprolol (TOPROL-XL) 50 MG 24 hr tablet Take 50 mg by mouth daily.    04/12/2015 at Unknown time  . omeprazole (PRILOSEC) 40 MG capsule Take 40 mg by mouth daily.  0 04/12/2015 at Unknown time  . simvastatin (  ZOCOR) 40 MG tablet Take 40 mg by mouth daily at 6 PM.    04/12/2015 at Unknown time  . warfarin (COUMADIN) 5 MG tablet Take 1/2 tablet daily except 1 tablet on Mondays and Fridays (Patient taking differently: Take 2.5-5 mg by mouth daily at 6 PM. Take 1/2 tablet daily except 1 tablet on Fridays) 90 tablet 3 Past Week at Unknown time    Assessment: 75 yo female with acute right knee pain with locking and catching.  Has acute meniscal tear on MRI. Patient is on chronic Coumadin therapy for AVR in 1996 and CVA. Knee surgery 04/13/15.  Anticoagulation: Coumadin PTA (LD around 1/16 or 1/17) with bridge to LMWH in anticipation of knee surgery for meniscal tear. 1/18 INR 2.9. INR today 1.24. CBC PTA was WNL - Coumadin PTA 2.5mg  daily except 5mg  on Fridays.   Goal of Therapy:   INR 2.5-3.5 Monitor platelets by anticoagulation protocol: Yes   Plan:  Post-op, resume LMWH 90 mg BID (decrease dose for weight) Coumadin 5mg  po x 1 tonight Daily INR, CBC q72h on LMWH    Hina Gupta S. Alford Highland, PharmD, BCPS Clinical Staff Pharmacist Pager [phone]  Eilene Ghazi Stillinger 04/13/2015,3:24 PM

## 2015-04-14 ENCOUNTER — Encounter (HOSPITAL_COMMUNITY): Payer: Self-pay | Admitting: Orthopaedic Surgery

## 2015-04-14 DIAGNOSIS — S83241A Other tear of medial meniscus, current injury, right knee, initial encounter: Secondary | ICD-10-CM | POA: Diagnosis not present

## 2015-04-14 LAB — CBC
HCT: 38.1 % (ref 36.0–46.0)
Hemoglobin: 12.6 g/dL (ref 12.0–15.0)
MCH: 34.4 pg — ABNORMAL HIGH (ref 26.0–34.0)
MCHC: 33.1 g/dL (ref 30.0–36.0)
MCV: 104.1 fL — ABNORMAL HIGH (ref 78.0–100.0)
PLATELETS: 120 10*3/uL — AB (ref 150–400)
RBC: 3.66 MIL/uL — AB (ref 3.87–5.11)
RDW: 15.2 % (ref 11.5–15.5)
WBC: 8 10*3/uL (ref 4.0–10.5)

## 2015-04-14 LAB — PROTIME-INR
INR: 1.42 (ref 0.00–1.49)
PROTHROMBIN TIME: 17.4 s — AB (ref 11.6–15.2)

## 2015-04-14 LAB — GLUCOSE, CAPILLARY
GLUCOSE-CAPILLARY: 94 mg/dL (ref 65–99)
Glucose-Capillary: 84 mg/dL (ref 65–99)

## 2015-04-14 MED ORDER — OXYCODONE HCL 5 MG PO TABS: 5.0000 mg | ORAL_TABLET | ORAL | Status: DC | PRN

## 2015-04-14 NOTE — Discharge Instructions (Signed)
Weight bearing as tolerated right lower extremity. May Remove dressing 04/15/15 and get incision wet . Apply band aids to incision sites after drying knee.

## 2015-04-14 NOTE — Progress Notes (Signed)
Subjective: 1 Day Post-Op Procedure(s) (LRB): RIGHT KNEE ARTHROSCOPY WITH DEBRIDEMENT AND PARTIAL MEDIAL MENISCECTOMY (Right) Patient reports pain as moderate.    Objective: Vital signs in last 24 hours: Temp:  [97.3 F (36.3 C)-98.6 F (37 C)] 98.6 F (37 C) (01/25 0615) Pulse Rate:  [60-75] 64 (01/25 0615) Resp:  [13-18] 16 (01/25 0615) BP: (141-179)/(47-75) 141/48 mmHg (01/25 0615) SpO2:  [92 %-100 %] 92 % (01/25 0615) Weight:  [93.078 kg (205 lb 3.2 oz)] 93.078 kg (205 lb 3.2 oz) (01/24 1054)  Intake/Output from previous day: 01/24 0701 - 01/25 0700 In: 1970 [P.O.:720; I.V.:1250] Out: -  Intake/Output this shift:     Recent Labs  04/14/15 0521  HGB 12.6    Recent Labs  04/14/15 0521  WBC 8.0  RBC 3.66*  HCT 38.1  PLT 120*   No results for input(s): NA, K, CL, CO2, BUN, CREATININE, GLUCOSE, CALCIUM in the last 72 hours.  Recent Labs  04/13/15 1130  INR 1.24    Dorsiflexion/Plantar flexion intact Incision: dressing C/D/I Compartment soft  Assessment/Plan: 1 Day Post-Op Procedure(s) (LRB): RIGHT KNEE ARTHROSCOPY WITH DEBRIDEMENT AND PARTIAL MEDIAL MENISCECTOMY (Right) Up with therapy  Discharge to home   Mary [name], Mary [name] 04/14/2015, 8:01 AM

## 2015-04-14 NOTE — Evaluation (Signed)
Physical Therapy Evaluation Patient Details Name: Mary [name] MRN: [id number] DOB: 1941-01-30 Today's Date: 04/14/2015   History of Present Illness  Admitted for R knee arthroscope and partial meniscectomy;   Past Medical History  Diagnosis Date  . DJD (degenerative joint disease)   . Overweight(278.02)   . Hyperlipidemia   . Hypertension   . Shortness of breath dyspnea     with exertion  . Diabetes mellitus without complication ([place])   . Stroke [facility])     " light stroke"  . Sleep apnea   . Anxiety   . Wears glasses   . Diabetic neuropathy ([place])   . Diabetic neuropathy ([place])   . Neuromuscular disorder ([place])     neuropathy in feet   Past Surgical History  Procedure Laterality Date  . Ankle surgery      Left tendon repair  . Aortic valve replacement  03/1994  . Lumbar fusion    . Cervical spine surgery    . Hemorrhoid surgery    . Cardiac catheterization      [place]  . Colonoscopy    . Knee arthroscopy w/ meniscectomy Right 04/13/2015     Clinical Impression  Patient evaluated by Physical Therapy with no further acute PT needs identified. All education has been completed and the patient has no further questions.  See below for any follow-up Physical Therapy or equipment needs. PT is signing off. Thank you for this referral.  OK for dc home from PT standpoint.     Follow Up Recommendations Outpatient PT;Supervision for mobility/OOB      Equipment Recommendations  None recommended by PT (quite well-equipped)    Recommendations for Other Services       Precautions / Restrictions Precautions Precautions: Knee Precaution Booklet Issued: Yes (comment) Restrictions RLE Weight Bearing: Weight bearing as tolerated      Mobility  Bed Mobility Overal bed mobility: Needs Assistance Bed Mobility: Supine to Sit     Supine to sit: Supervision     General bed mobility comments: Step by step cues for technqiue  Transfers Overall transfer level: Needs  assistance Equipment used: Rolling walker (2 wheeled) Transfers: Sit to/from Stand Sit to Stand: Min guard         General transfer comment: Cues for hand placement and safety  Ambulation/Gait Ambulation/Gait assistance: Min guard (without physical contact) Ambulation Distance (Feet): 100 Feet Assistive device: Rolling walker (2 wheeled) Gait Pattern/deviations: Step-through pattern     General Gait Details: Cues for sequence and to activiate quad for stance stability; nice stable knee in stance  Stairs            Wheelchair Mobility    Modified Rankin (Stroke Patients Only)       Balance                                             Pertinent Vitals/Pain Pain Assessment: 0-10 Pain Score: 6  Pain Location: R knee Pain Descriptors / Indicators: Aching Pain Intervention(s): Limited activity within patient's tolerance;Monitored during session;Repositioned    Home Living Family/patient expects to be discharged to:: Private residence Living Arrangements: Spouse/significant other Available Help at Discharge: Family;Available 24 hours/day Type of Home: House Home Access: Ramped entrance     Home Layout: One level Home Equipment: Walker - 2 wheels;Walker - 4 wheels;Wheelchair - Education officer, community - power;Bedside commode;Shower seat  Prior Function Level of Independence: Independent               Hand Dominance        Extremity/Trunk Assessment   Upper Extremity Assessment: Overall WFL for tasks assessed           Lower Extremity Assessment: RLE deficits/detail RLE Deficits / Details: Grossly decr AROM and strength, limited by pain postop; ROM approx 3-60 deg       Communication   Communication: No difficulties  Cognition Arousal/Alertness: Awake/alert Behavior During Therapy: WFL for tasks assessed/performed Overall Cognitive Status: Within Functional Limits for tasks assessed                      General  Comments General comments (skin integrity, edema, etc.): Pt educated to not allow any pillow or bolster under knee for healing with optimal range of motion.     Exercises General Exercises - Lower Extremity Quad Sets: AROM;Right;5 reps Long Arc Quad: AAROM;Right;5 reps Heel Slides: AAROM;Right;5 reps      Assessment/Plan    PT Assessment All further PT needs can be met in the next venue of care  PT Diagnosis Difficulty walking;Acute pain   PT Problem List Decreased strength;Decreased range of motion;Decreased knowledge of use of DME;Pain;Decreased knowledge of precautions  PT Treatment Interventions     PT Goals (Current goals can be found in the Care Plan section) Acute Rehab PT Goals Patient Stated Goal: hopes to go home soon PT Goal Formulation: All assessment and education complete, DC therapy    Frequency     Barriers to discharge        Co-evaluation               End of Session Equipment Utilized During Treatment: Gait belt Activity Tolerance: Patient tolerated treatment well Patient left: in chair;with call bell/phone within reach Nurse Communication: Mobility status;Other (comment) (ok for dc from PT standpoint)    Functional Assessment Tool Used: Clinical Judgement Functional Limitation: Mobility: Walking and moving around Mobility: Walking and Moving Around Current Status (X4503): At least 1 percent but less than 20 percent impaired, limited or restricted Mobility: Walking and Moving Around Goal Status [phone]): 0 percent impaired, limited or restricted Mobility: Walking and Moving Around Discharge Status [phone]): At least 1 percent but less than 20 percent impaired, limited or restricted    Time: 0835-0904 PT Time Calculation (min) (ACUTE ONLY): 29 min   Charges:   PT Evaluation $PT Eval Low Complexity: 1 Procedure PT Treatments $Gait Training: 8-22 mins   PT G Codes:   PT G-Codes **NOT FOR INPATIENT CLASS** Functional Assessment Tool Used:  Clinical Judgement Functional Limitation: Mobility: Walking and moving around Mobility: Walking and Moving Around Current Status (Z7915): At least 1 percent but less than 20 percent impaired, limited or restricted Mobility: Walking and Moving Around Goal Status [phone]): 0 percent impaired, limited or restricted Mobility: Walking and Moving Around Discharge Status [id number]): At least 1 percent but less than 20 percent impaired, limited or restricted    Roney Marion [facility] 04/14/2015, 9:15 AM  Roney Marion, [place] Pager [phone] Office [phone]

## 2015-04-14 NOTE — Discharge Summary (Signed)
Patient ID: Mary [name] MRN: [id number] DOB/AGE: 77-Mar-1942 77 y.o.  Admit date: 04/13/2015 Discharge date: 04/14/2015  Admission Diagnoses:  Active Problems:   S/P right knee arthroscopy   Discharge Diagnoses:  Same  Past Medical History  Diagnosis Date  . DJD (degenerative joint disease)   . Overweight(278.02)   . Hyperlipidemia   . Hypertension   . Shortness of breath dyspnea     with exertion  . Diabetes mellitus without complication ([place])   . Stroke [facility])     " light stroke"  . Sleep apnea   . Anxiety   . Wears glasses   . Diabetic neuropathy ([place])   . Diabetic neuropathy ([place])   . Neuromuscular disorder ([place])     neuropathy in feet    Surgeries: Procedure(s): RIGHT KNEE ARTHROSCOPY WITH DEBRIDEMENT AND PARTIAL MEDIAL MENISCECTOMY on 04/13/2015   Consultants:  PT  Discharged Condition: Improved  Hospital Course: Mary [name] is an 75 y.o. female who was admitted 04/13/2015 for operative treatment of<principal problem not specified>. Patient has severe unremitting pain that affects sleep, daily activities, and work/hobbies. After pre-op clearance the patient was taken to the operating room on 04/13/2015 and underwent  Procedure(s): RIGHT KNEE ARTHROSCOPY WITH DEBRIDEMENT AND PARTIAL MEDIAL MENISCECTOMY.    Patient was given perioperative antibiotics: Anti-infectives    Start     Dose/Rate Route Frequency Ordered Stop   04/13/15 1200  ceFAZolin (ANCEF) IVPB 2 g/50 mL premix     2 g 100 mL/hr over 30 Minutes Intravenous To ShortStay Surgical 04/12/15 1342 04/13/15 1230       Patient was given sequential compression devices, early ambulation, and chemoprophylaxis to prevent DVT.  Patient benefited maximally from hospital stay and there were no complications.    Recent vital signs: Patient Vitals for the past 24 hrs:  BP Temp Temp src Pulse Resp SpO2 Height Weight  04/14/15 0615 (!) 141/48 mmHg 98.6 F (37 C) Oral 64 16 92 % - -  04/14/15 0120 (!) 166/64 mmHg  98.2 F (36.8 C) Oral 69 16 95 % - -  04/13/15 2021 (!) 163/53 mmHg 98 F (36.7 C) Oral 65 16 95 % - -  04/13/15 1556 (!) 173/64 mmHg - - - - - - -  04/13/15 1555 (!) 173/64 mmHg - - 75 - - - -  04/13/15 1454 (!) 173/64 mmHg 97.9 F (36.6 C) Oral 75 16 100 % - -  04/13/15 1436 - 97.9 F (36.6 C) - 65 13 100 % - -  04/13/15 1430 - - - 65 14 100 % - -  04/13/15 1425 (!) 171/68 mmHg - - - - - - -  04/13/15 1415 - - - 67 13 98 % - -  04/13/15 1410 (!) 178/75 mmHg - - - - - - -  04/13/15 1400 - - - 68 14 100 % - -  04/13/15 1355 (!) 179/71 mmHg - - - - - - -  04/13/15 1339 (!) 175/62 mmHg - - 75 13 99 % - -  04/13/15 1330 - - - 68 14 97 % - -  04/13/15 1316 (!) 155/65 mmHg 97.5 F (36.4 C) - - - - - -  04/13/15 1315 - - - 72 15 100 % - -  04/13/15 1054 (!) 173/47 mmHg 97.3 F (36.3 C) Oral 60 18 98 % 5\' 7"  (1.702 m) 93.078 kg (205 lb 3.2 oz)     Recent laboratory studies:  Recent Labs  04/13/15 1130  04/14/15 0521  WBC  --  8.0  HGB  --  12.6  HCT  --  38.1  PLT  --  120*  INR 1.24 1.42     Discharge Medications:     Medication List    STOP taking these medications        HYDROcodone-acetaminophen 10-325 MG tablet  Commonly known as:  NORCO      TAKE these medications        acetaminophen 500 MG tablet  Commonly known as:  TYLENOL  Take 500-1,000 mg by mouth daily as needed for moderate pain.     ALPRAZolam 0.5 MG tablet  Commonly known as:  XANAX  Take 0.5 mg by mouth 4 (four) times daily as needed for anxiety or sleep (every night for sleep).     bisacodyl 5 MG EC tablet  Commonly known as:  DULCOLAX  Take 5 mg by mouth daily as needed for moderate constipation.     cetirizine 10 MG tablet  Commonly known as:  ZYRTEC  Take 10 mg by mouth daily as needed for allergies.     [facility] MELATONIN 3 MG Tabs  Generic drug:  Melatonin  Take 3 mg by mouth at bedtime.     enoxaparin 100 MG/ML injection  Commonly known as:  LOVENOX  Inject 1 mL (100 mg total) into  the skin every 12 (twelve) hours.     ergocalciferol 50000 units capsule  Commonly known as:  VITAMIN D2  Take 50,000 Units by mouth every Friday.     fluticasone 50 MCG/ACT nasal spray  Commonly known as:  FLONASE  Place 2 sprays into both nostrils daily.     furosemide 40 MG tablet  Commonly known as:  LASIX  Take 40 mg by mouth daily as needed for fluid.     gabapentin 100 MG capsule  Commonly known as:  NEURONTIN  Take 100 mg by mouth daily.     glimepiride 4 MG tablet  Commonly known as:  AMARYL  Take 4 mg by mouth daily as needed (for high blood sugar).     lisinopril 10 MG tablet  Commonly known as:  PRINIVIL,ZESTRIL  TAKE 1 TABLET BY MOUTH EVERY DAY FOR HIGH BP AND HEART     metFORMIN 500 MG 24 hr tablet  Commonly known as:  GLUCOPHAGE-XR  Take 1,000 mg by mouth every morning. As needed for high blood sugar     metoprolol succinate 50 MG 24 hr tablet  Commonly known as:  TOPROL-XL  Take 50 mg by mouth daily.     omeprazole 40 MG capsule  Commonly known as:  PRILOSEC  Take 40 mg by mouth daily.     oxyCODONE 5 MG immediate release tablet  Commonly known as:  Oxy IR/ROXICODONE  Take 1-2 tablets (5-10 mg total) by mouth every 3 (three) hours as needed for breakthrough pain.     simvastatin 40 MG tablet  Commonly known as:  ZOCOR  Take 40 mg by mouth daily at 6 PM.     warfarin 5 MG tablet  Commonly known as:  COUMADIN  Take 1/2 tablet daily except 1 tablet on Mondays and Fridays        Diagnostic Studies: No results found.  Disposition: Home with home health needs Coumadin monitoring         Follow-up Information    Follow up with Mcarthur Rossetti, MD. Schedule an appointment as soon as possible for a visit in 1 week.  Specialty:  Orthopedic Surgery   Contact information:   [name] [address] [phone]        Signed: Erskine Emery 04/14/2015, 8:17 AM

## 2015-04-16 NOTE — Anesthesia Postprocedure Evaluation (Signed)
Anesthesia Post Note  Patient: Mary [name]  Procedure(s) Performed: Procedure(s) (LRB): RIGHT KNEE ARTHROSCOPY WITH DEBRIDEMENT AND PARTIAL MEDIAL MENISCECTOMY (Right)  Patient location during evaluation: PACU Anesthesia Type: General Level of consciousness: awake and alert Pain management: pain level controlled Vital Signs Assessment: post-procedure vital signs reviewed and stable Respiratory status: spontaneous breathing, nonlabored ventilation, respiratory function stable and patient connected to nasal cannula oxygen Cardiovascular status: blood pressure returned to baseline and stable Postop Assessment: no signs of nausea or vomiting Anesthetic complications: no    Last Vitals:  Filed Vitals:   04/14/15 0120 04/14/15 0615  BP: 166/64 141/48  Pulse: 69 64  Temp: 36.8 C 37 C  Resp: 16 16    Last Pain:  Filed Vitals:   04/14/15 0954  PainSc: 7                  Valene Villa,JAMES TERRILL

## 2015-04-19 ENCOUNTER — Ambulatory Visit (INDEPENDENT_AMBULATORY_CARE_PROVIDER_SITE_OTHER): Payer: Medicare Other | Admitting: *Deleted

## 2015-04-19 DIAGNOSIS — I639 Cerebral infarction, unspecified: Secondary | ICD-10-CM | POA: Diagnosis not present

## 2015-04-19 DIAGNOSIS — Z954 Presence of other heart-valve replacement: Secondary | ICD-10-CM | POA: Diagnosis not present

## 2015-04-19 DIAGNOSIS — I359 Nonrheumatic aortic valve disorder, unspecified: Secondary | ICD-10-CM | POA: Diagnosis not present

## 2015-04-19 DIAGNOSIS — Z952 Presence of prosthetic heart valve: Secondary | ICD-10-CM

## 2015-04-19 LAB — POCT INR: INR: 2.8

## 2015-04-26 ENCOUNTER — Ambulatory Visit: Payer: Medicare Other | Attending: Orthopaedic Surgery | Admitting: Physical Therapy

## 2015-04-26 ENCOUNTER — Other Ambulatory Visit: Payer: Self-pay

## 2015-04-26 DIAGNOSIS — R5381 Other malaise: Secondary | ICD-10-CM | POA: Diagnosis present

## 2015-04-26 DIAGNOSIS — M25561 Pain in right knee: Secondary | ICD-10-CM | POA: Insufficient documentation

## 2015-04-26 DIAGNOSIS — R29898 Other symptoms and signs involving the musculoskeletal system: Secondary | ICD-10-CM | POA: Insufficient documentation

## 2015-04-26 DIAGNOSIS — M25461 Effusion, right knee: Secondary | ICD-10-CM

## 2015-04-26 DIAGNOSIS — G8929 Other chronic pain: Secondary | ICD-10-CM | POA: Diagnosis present

## 2015-04-26 DIAGNOSIS — M25661 Stiffness of right knee, not elsewhere classified: Secondary | ICD-10-CM

## 2015-04-26 MED ORDER — WARFARIN SODIUM 5 MG PO TABS: ORAL_TABLET | ORAL | Status: DC

## 2015-04-26 NOTE — Therapy (Signed)
[facility] [place], [name], [address] Phone: [phone]   Fax:  [phone]  Physical Therapy Evaluation  Patient Details  Name: Mary [name] MRN: [id number] Date of Birth: 1940/05/03 Referring Provider: Jean Rosenthal MD  Encounter Date: 04/26/2015      PT End of Session - 04/26/15 1201    Visit Number 1   Number of Visits 12   Date for PT Re-Evaluation 06/07/15   PT Start Time 1030   PT Stop Time 1120   PT Time Calculation (min) 50 min   Activity Tolerance Patient tolerated treatment well   Behavior During Therapy [facility] for tasks assessed/performed      Past Medical History  Diagnosis Date  . DJD (degenerative joint disease)   . Overweight(278.02)   . Hyperlipidemia   . Hypertension   . Shortness of breath dyspnea     with exertion  . Diabetes mellitus without complication ([place])   . Stroke [facility])     " light stroke"  . Sleep apnea   . Anxiety   . Wears glasses   . Diabetic neuropathy ([place])   . Diabetic neuropathy ([place])   . Neuromuscular disorder ([place])     neuropathy in feet    Past Surgical History  Procedure Laterality Date  . Ankle surgery      Left tendon repair  . Aortic valve replacement  03/1994  . Lumbar fusion    . Cervical spine surgery    . Hemorrhoid surgery    . Cardiac catheterization      [place]  . Colonoscopy    . Knee arthroscopy w/ meniscectomy Right 04/13/2015  . Knee arthroscopy Right 04/13/2015    Procedure: RIGHT KNEE ARTHROSCOPY WITH DEBRIDEMENT AND PARTIAL MEDIAL MENISCECTOMY;  Surgeon: Mcarthur Rossetti, MD;  Location: [facility];  Service: Orthopedics;  Laterality: Right;    There were no vitals filed for this visit.  Visit Diagnosis:  Knee pain, chronic, right - Plan: PT plan of care cert/re-cert  Decreased range of motion of right knee - Plan: PT plan of care cert/re-cert  Swelling of right knee joint - Plan: PT plan of care cert/re-cert  Debility -  Plan: PT plan of care cert/re-cert      Subjective Assessment - 04/26/15 1048    Subjective I really want to get to get back to normal.   Limitations Walking   Patient Stated Goals Want to reduce my knee pain and do the things I use to do.   Pain Score 7    Pain Location Knee   Pain Orientation Right   Pain Descriptors / Indicators Aching   Pain Type Surgical pain   Pain Frequency Intermittent            [facility] PT Assessment - 04/26/15 0001    Assessment   Medical Diagnosis Post right knee arthroscopic surgery   Referring Provider Jean Rosenthal MD   Onset Date/Surgical Date --  04/13/15 (surgery date).   Precautions   Precautions Fall   Restrictions   Weight Bearing Restrictions No   Balance Screen   Has the patient fallen in the past 6 months Yes   How many times? 3   Has the patient had a decrease in activity level because of a fear of falling?  Yes   Is the patient reluctant to leave their home because of a fear of falling?  Yes   [place] residence  Prior Function   Level of Independence Independent   Observation/Other Assessments   Observations Right knee still very ecchymotic.   Observation/Other Assessments-Edema    Edema Circumferential   Circumferential Edema   Circumferential - Right 50 cms.   Circumferential - Left  43 cms.   ROM / Strength   AROM / PROM / Strength AROM;PROM;Strength   AROM   Overall AROM Comments -17 degrees of right knee xtension and flexion limited to 81 degrees.   Strength   Overall Strength Comments Right hip flexion and abduction= 3-/5 and right knee strength= 3-/5.   Palpation   Patella mobility Moderate decrease in all directions.   Palpation comment tender to palpation over right anterior knee especially medially.   Ambulation/Gait   Gait Comments patient gait pattern is antalgic with a FWW.                   [facility] Adult PT Treatment/Exercise - 04/26/15 0001     Modalities   Modalities Electrical Stimulation   Electrical Stimulation   Electrical Stimulation Location Right knee.   Electrical Stimulation Action Constant pre-mod e'stim   Electrical Stimulation Parameters 80-150 Hz x 15 minutes.   Electrical Stimulation Goals Pain                  PT Short Term Goals - 04/26/15 1204    PT SHORT TERM GOAL #1   Title Ind wiht a HEP.   Time 2   Period Weeks   Status New   PT SHORT TERM GOAL #2   Title Full active right knee extension.   Time 2   Period Weeks   Status New           PT Long Term Goals - 04/26/15 1205    PT LONG TERM GOAL #1   Title Active right knee flexion to 115 degrees+ so the patient can perform functional tasks and do so with pain not > 2-3/10.   Time 4   Period Weeks   Status New   PT LONG TERM GOAL #2   Title Increase right knee strength to a solid 4+/5 to provide good stability for accomplishment of functional activities   Time 4   Period Weeks   Status New   PT LONG TERM GOAL #3   Title Decrease edema to within 2 cms of non-affected side to assist with pain reduction and range of motion gains.   Time 4   Period Weeks   Status New   PT LONG TERM GOAL #4   Title Perform a reciprocating stair gait with one railing with pain not > 2-3/10.   Time 4   Period Weeks   Status New   PT LONG TERM GOAL #5   Title Walk in clinic 500 feet with a straight cane without deviation.   Time 4   Period Weeks   Status New               Plan - 04/26/15 1201    Clinical Impression Statement The patient underwent a right knee arthroscopic surgery on 04/13/15.  She is still in quite a lot of pain and feels best staying on a FWW at this time.  She has had 3 falls over the last three months.  Increased walking raises her pain-level to 7+/10.   Pt will benefit from skilled therapeutic intervention in order to improve on the following deficits Pain;Decreased activity tolerance;Decreased range of motion;Decreased  strength;Difficulty walking   Rehab  Potential Good   PT Frequency 2x / week   PT Duration 6 weeks   PT Treatment/Interventions ADLs/Self Care Home Management;Electrical Stimulation;Cryotherapy;Therapeutic exercise;Therapeutic activities;Gait training;Ultrasound;Balance training;Neuromuscular re-education;Manual techniques;Vasopneumatic Device;Passive range of motion   PT Next Visit Plan Low-level Nustep; gentle right knee ROM; SLR's; QS; hip abduction.  Right patellar mobs.  Modalities as needed.   Consulted and Agree with Plan of Care Patient          G-Codes - 2015/05/03 06/18/06    Functional Assessment Tool Used FOTO.  85% limitation.   Functional Limitation Mobility: Walking and moving around   Mobility: Walking and Moving Around Current Status [phone]) At least 80 percent but less than 100 percent impaired, limited or restricted   Mobility: Walking and Moving Around Goal Status [phone]) At least 20 percent but less than 40 percent impaired, limited or restricted       Problem List Patient Active Problem List   Diagnosis Date Noted  . S/P right knee arthroscopy 04/13/2015  . OSA (obstructive sleep apnea) 01/01/2013  . Diabetes ([place]) 10/23/2012  . Aortic valve disorder 06/30/2010  . CVA (cerebral vascular accident) ([place]) 06/30/2010  . S/P aortic valve replacement 06/30/2010  . HYPERLIPIDEMIA 09/29/2008  . OVERWEIGHT 09/29/2008  . HYPERTENSION 09/29/2008  . DEGENERATIVE JOINT DISEASE 09/29/2008    APPLEGATE, [place] MPT 2015-05-03, 12:43 PM  [facility] [place], [name], [address] Phone: [phone]   Fax:  [phone]  Name: Mary [name] MRN: [id number] Date of Birth: 1940-09-05

## 2015-04-28 ENCOUNTER — Encounter: Payer: Self-pay | Admitting: Physical Therapy

## 2015-04-28 ENCOUNTER — Ambulatory Visit: Payer: Medicare Other | Admitting: Physical Therapy

## 2015-04-28 DIAGNOSIS — M25661 Stiffness of right knee, not elsewhere classified: Secondary | ICD-10-CM

## 2015-04-28 DIAGNOSIS — G8929 Other chronic pain: Secondary | ICD-10-CM

## 2015-04-28 DIAGNOSIS — M25561 Pain in right knee: Principal | ICD-10-CM

## 2015-04-28 DIAGNOSIS — M25461 Effusion, right knee: Secondary | ICD-10-CM

## 2015-04-28 DIAGNOSIS — R5381 Other malaise: Secondary | ICD-10-CM

## 2015-04-28 NOTE — Therapy (Addendum)
[facility] [place], [name], [address] Phone: [phone]   Fax:  [phone]  Physical Therapy Treatment  Patient Details  Name: Mary [name] MRN: [id number] Date of Birth: 1940-06-30 Referring Provider: Jean Rosenthal MD  Encounter Date: 04/28/2015      PT End of Session - 04/28/15 1039    Visit Number 2   Number of Visits 12   Date for PT Re-Evaluation 06/07/15   PT Start Time 1034   PT Stop Time 1126   PT Time Calculation (min) 52 min   Activity Tolerance Patient tolerated treatment well;Patient limited by pain   Behavior During Therapy [facility] for tasks assessed/performed      Past Medical History  Diagnosis Date  . DJD (degenerative joint disease)   . Overweight(278.02)   . Hyperlipidemia   . Hypertension   . Shortness of breath dyspnea     with exertion  . Diabetes mellitus without complication ([place])   . Stroke [facility])     " light stroke"  . Sleep apnea   . Anxiety   . Wears glasses   . Diabetic neuropathy ([place])   . Diabetic neuropathy ([place])   . Neuromuscular disorder ([place])     neuropathy in feet    Past Surgical History  Procedure Laterality Date  . Ankle surgery      Left tendon repair  . Aortic valve replacement  03/1994  . Lumbar fusion    . Cervical spine surgery    . Hemorrhoid surgery    . Cardiac catheterization      [place]  . Colonoscopy    . Knee arthroscopy w/ meniscectomy Right 04/13/2015  . Knee arthroscopy Right 04/13/2015    Procedure: RIGHT KNEE ARTHROSCOPY WITH DEBRIDEMENT AND PARTIAL MEDIAL MENISCECTOMY;  Surgeon: Mcarthur Rossetti, MD;  Location: [place];  Service: Orthopedics;  Laterality: Right;    There were no vitals filed for this visit.  Visit Diagnosis:  Knee pain, chronic, right  Decreased range of motion of right knee  Swelling of right knee joint  Debility      Subjective Assessment - 04/28/15 1037    Subjective Patient states that if she  had known surgery was going to be like this then she wouldn't have had it done. Feels that surgery and recovery was not explained to her prior to surgery. Reports experiencing back pain today as well.   Limitations Walking   Patient Stated Goals Want to reduce my knee pain and do the things I use to do.   Currently in Pain? Yes   Pain Score 10-Worst pain ever   Pain Location Knee   Pain Orientation Right   Pain Descriptors / Indicators --  "It hurts"   Pain Type Surgical pain            [facility] PT Assessment - 04/28/15 0001    Assessment   Medical Diagnosis Post right knee arthroscopic surgery   Onset Date/Surgical Date 04/13/15   Next MD Visit 04/2015   Precautions   Precautions Fall   Restrictions   Weight Bearing Restrictions No   ROM / Strength   AROM / PROM / Strength AROM   AROM   Overall AROM  Deficits   AROM Assessment Site Knee   Right/Left Knee Right   Right Knee Extension -5   Right Knee Flexion 52  [place] Adult PT Treatment/Exercise - 04/28/15 0001    Exercises   Exercises Knee/Hip   Knee/Hip Exercises: Aerobic   Nustep L3, seat 12 x10 min, seat 11 x5 min   Knee/Hip Exercises: Supine   Quad Sets AROM;Right;1 set;10 reps  Patient experienced pain with exercise   Modalities   Modalities Electrical Stimulation;Vasopneumatic   Acupuncturist Location R knee   Electrical Stimulation Action Pre-Mod   Electrical Stimulation Parameters 80-150 hz x15 min   Electrical Stimulation Goals Pain;Edema   Vasopneumatic   Number Minutes Vasopneumatic  15 minutes   Vasopnuematic Location  Knee   Vasopneumatic Pressure Medium   Vasopneumatic Temperature  64   Manual Therapy   Manual Therapy Passive ROM;Soft tissue mobilization   Soft tissue mobilization STW to R Quad, posterior knee, ITB to decrease soreness and tightness reported by patient in that region   Passive ROM PROM of R knee into flexion with  gentle holds at end range                  PT Short Term Goals - 04/28/15 1526    PT SHORT TERM GOAL #1   Title Ind wiht a HEP.   Time 2   Period Weeks   Status On-going   PT SHORT TERM GOAL #2   Title Full active right knee extension.   Time 2   Period Weeks   Status On-going  R knee AROM -5 deg from neutral 04/28/2015           PT Long Term Goals - 04/28/15 1527    PT LONG TERM GOAL #1   Title Active right knee flexion to 115 degrees+ so the patient can perform functional tasks and do so with pain not > 2-3/10.   Time 4   Period Weeks   Status On-going  AROM R knee flexion 52 deg as of 04/28/2015   PT LONG TERM GOAL #2   Title Increase right knee strength to a solid 4+/5 to provide good stability for accomplishment of functional activities   Time 4   Period Weeks   Status On-going   PT LONG TERM GOAL #3   Title Decrease edema to within 2 cms of non-affected side to assist with pain reduction and range of motion gains.   Time 4   Period Weeks   Status On-going   PT LONG TERM GOAL #4   Title Perform a reciprocating stair gait with one railing with pain not > 2-3/10.   Time 4   Period Weeks   Status On-going   PT LONG TERM GOAL #5   Title Walk in clinic 500 feet with a straight cane without deviation.   Time 4   Period Weeks   Status On-going               Plan - 04/28/15 1112    Clinical Impression Statement Patient tolerated today's treatment fair due to increased R knee experienced by patient prior to today's treatment that continued during treatment. Patient experienced tenderness to palpation of R knee during manual therapy and R knee flexion ROM limited during manual PROM. Patient hesitant regarding exercise due to R knee pain and experienced pain with QS. Very minimal R Quad contraction noted with R QS during treatment. Patient continues to ambulate with FWW and is very careful duirng ambulation. Patient's R knee AROM measured as 5-52 deg today  and patient presented with visibly increased R knee edema. Normal modalities  response noted following removal of the modalities.   Pt will benefit from skilled therapeutic intervention in order to improve on the following deficits Pain;Decreased activity tolerance;Decreased range of motion;Decreased strength;Difficulty walking   Rehab Potential Good   PT Frequency 2x / week   PT Duration 6 weeks   PT Treatment/Interventions ADLs/Self Care Home Management;Electrical Stimulation;Cryotherapy;Therapeutic exercise;Therapeutic activities;Gait training;Ultrasound;Balance training;Neuromuscular re-education;Manual techniques;Vasopneumatic Device;Passive range of motion   PT Next Visit Plan Continue with low level strengthening and ROM exercises with modalitiesP PRN for pain and edema per MPT POC.   Consulted and Agree with Plan of Care Patient        Problem List Patient Active Problem List   Diagnosis Date Noted  . S/P right knee arthroscopy 04/13/2015  . OSA (obstructive sleep apnea) 01/01/2013  . Diabetes ([place]) 10/23/2012  . Aortic valve disorder 06/30/2010  . CVA (cerebral vascular accident) ([place]) 06/30/2010  . S/P aortic valve replacement 06/30/2010  . HYPERLIPIDEMIA 09/29/2008  . OVERWEIGHT 09/29/2008  . HYPERTENSION 09/29/2008  . DEGENERATIVE JOINT DISEASE 09/29/2008    Wynelle Fanny, PTA 04/28/2015, 3:28 PM  [facility] [place], [name], [address] Phone: [phone]   Fax:  [phone]  Name: MARYLINE SCHMITTOU MRN: [id number] Date of Birth: January 30, 1941

## 2015-04-30 ENCOUNTER — Encounter: Payer: Self-pay | Admitting: *Deleted

## 2015-04-30 ENCOUNTER — Ambulatory Visit: Payer: Medicare Other | Admitting: *Deleted

## 2015-04-30 DIAGNOSIS — M25561 Pain in right knee: Secondary | ICD-10-CM | POA: Diagnosis not present

## 2015-04-30 DIAGNOSIS — M25461 Effusion, right knee: Secondary | ICD-10-CM

## 2015-04-30 DIAGNOSIS — R5381 Other malaise: Secondary | ICD-10-CM

## 2015-04-30 DIAGNOSIS — M25661 Stiffness of right knee, not elsewhere classified: Secondary | ICD-10-CM

## 2015-04-30 DIAGNOSIS — G8929 Other chronic pain: Secondary | ICD-10-CM

## 2015-04-30 NOTE — Therapy (Signed)
[facility] [place], [name], [address] Phone: [phone]   Fax:  [phone]  Physical Therapy Treatment  Patient Details  Name: Mary [name] MRN: [id number] Date of Birth: [birth year] Referring Provider: Jean Rosenthal MD  Encounter Date: 04/30/2015      PT End of Session - 04/30/15 1005    Visit Number 3   Number of Visits 12   Date for PT Re-Evaluation 06/07/15   PT Start Time 0945   PT Stop Time 1040   PT Time Calculation (min) 55 min      Past Medical History  Diagnosis Date  . DJD (degenerative joint disease)   . Overweight(278.02)   . Hyperlipidemia   . Hypertension   . Shortness of breath dyspnea     with exertion  . Diabetes mellitus without complication ([place])   . Stroke [facility])     " light stroke"  . Sleep apnea   . Anxiety   . Wears glasses   . Diabetic neuropathy ([place])   . Diabetic neuropathy ([place])   . Neuromuscular disorder ([place])     neuropathy in feet    Past Surgical History  Procedure Laterality Date  . Ankle surgery      Left tendon repair  . Aortic valve replacement  03/1994  . Lumbar fusion    . Cervical spine surgery    . Hemorrhoid surgery    . Cardiac catheterization      [place]  . Colonoscopy    . Knee arthroscopy w/ meniscectomy Right 04/13/2015  . Knee arthroscopy Right 04/13/2015    Procedure: RIGHT KNEE ARTHROSCOPY WITH DEBRIDEMENT AND PARTIAL MEDIAL MENISCECTOMY;  Surgeon: Mcarthur Rossetti, MD;  Location: [facility];  Service: Orthopedics;  Laterality: Right;    There were no vitals filed for this visit.  Visit Diagnosis:  Knee pain, chronic, right  Decreased range of motion of right knee  Swelling of right knee joint  Debility      Subjective Assessment - 04/30/15 0948    Subjective Patient states that if she had known surgery was going to be like this then she wouldn't have had it done. Feels that surgery and recovery was not explained to her  prior to surgery. Reports experiencing back pain today as well.   Limitations Walking   Patient Stated Goals Want to reduce my knee pain and do the things I use to do.   Currently in Pain? Yes   Pain Score 8    Pain Location Knee   Pain Orientation Right   Pain Descriptors / Indicators Aching;Burning   Pain Type Surgical pain                         [facility] Adult PT Treatment/Exercise - 04/30/15 0001    Exercises   Exercises Knee/Hip   Knee/Hip Exercises: Aerobic   Nustep L3, seat 12 x10 min, seat 11, 10 x5 min   Knee/Hip Exercises: Seated   Long Arc Quad AROM;AAROM;Right;3 sets;10 reps   Knee/Hip Exercises: Supine   Quad Sets AROM;Right;1 set;10 reps  Patient experienced pain with exercise   Modalities   Modalities Electrical Stimulation;Vasopneumatic   Acupuncturist Location R knee  Premod 1-10hz  x 15 mins   Electrical Stimulation Goals Pain;Edema   Vasopneumatic   Number Minutes Vasopneumatic  15 minutes   Vasopnuematic Location  Knee   Vasopneumatic Pressure Medium   Vasopneumatic Temperature  44  Manual Therapy   Manual Therapy Passive ROM;Soft tissue mobilization   Soft tissue mobilization STW to R Quad, posterior knee, ITB to decrease soreness and tightness reported by patient in that region   Passive ROM PROM of R knee into flexion with gentle holds at end range                  PT Short Term Goals - 04/28/15 1526    PT SHORT TERM GOAL #1   Title Ind wiht a HEP.   Time 2   Period Weeks   Status On-going   PT SHORT TERM GOAL #2   Title Full active right knee extension.   Time 2   Period Weeks   Status On-going  R knee AROM -5 deg from neutral 04/28/2015           PT Long Term Goals - 04/28/15 1527    PT LONG TERM GOAL #1   Title Active right knee flexion to 115 degrees+ so the patient can perform functional tasks and do so with pain not > 2-3/10.   Time 4   Period Weeks   Status On-going   AROM R knee flexion 52 deg as of 04/28/2015   PT LONG TERM GOAL #2   Title Increase right knee strength to a solid 4+/5 to provide good stability for accomplishment of functional activities   Time 4   Period Weeks   Status On-going   PT LONG TERM GOAL #3   Title Decrease edema to within 2 cms of non-affected side to assist with pain reduction and range of motion gains.   Time 4   Period Weeks   Status On-going   PT LONG TERM GOAL #4   Title Perform a reciprocating stair gait with one railing with pain not > 2-3/10.   Time 4   Period Weeks   Status On-going   PT LONG TERM GOAL #5   Title Walk in clinic 500 feet with a straight cane without deviation.   Time 4   Period Weeks   Status On-going               Plan - 04/30/15 1006    Clinical Impression Statement Pt did fairly well with Rx today, but is still apprehensive about moving her RT knee due to Pain. She was able to progress  to 80 degrees of PROM today. Pt is still  ambulating with FWW with a slow moving gate. Goals are ongoing.   Pt will benefit from skilled therapeutic intervention in order to improve on the following deficits Pain;Decreased activity tolerance;Decreased range of motion;Decreased strength;Difficulty walking   Rehab Potential Good   PT Frequency 2x / week   PT Duration 6 weeks   PT Treatment/Interventions ADLs/Self Care Home Management;Electrical Stimulation;Cryotherapy;Therapeutic exercise;Therapeutic activities;Gait training;Ultrasound;Balance training;Neuromuscular re-education;Manual techniques;Vasopneumatic Device;Passive range of motion   PT Next Visit Plan Continue with low level strengthening and ROM exercises with modalitiesP PRN for pain and edema per MPT POC.   Consulted and Agree with Plan of Care Patient        Problem List Patient Active Problem List   Diagnosis Date Noted  . S/P right knee arthroscopy 04/13/2015  . OSA (obstructive sleep apnea) 01/01/2013  . Diabetes ([place])  10/23/2012  . Aortic valve disorder 06/30/2010  . CVA (cerebral vascular accident) ([place]) 06/30/2010  . S/P aortic valve replacement 06/30/2010  . HYPERLIPIDEMIA 09/29/2008  . OVERWEIGHT 09/29/2008  . HYPERTENSION 09/29/2008  . DEGENERATIVE JOINT DISEASE 09/29/2008  Werner Labella,CHRIS, PTA 04/30/2015, 12:43 PM  [facility] [address] [place], [name], [address] Phone: [phone]   Fax:  [phone]  Name: EIBHLEANN RAUBER MRN: [id number] Date of Birth: Apr 17, 1940

## 2015-05-03 ENCOUNTER — Ambulatory Visit: Payer: Medicare Other | Admitting: Physical Therapy

## 2015-05-03 DIAGNOSIS — G8929 Other chronic pain: Secondary | ICD-10-CM

## 2015-05-03 DIAGNOSIS — M25561 Pain in right knee: Secondary | ICD-10-CM | POA: Diagnosis not present

## 2015-05-03 DIAGNOSIS — R5381 Other malaise: Secondary | ICD-10-CM

## 2015-05-03 DIAGNOSIS — M25461 Effusion, right knee: Secondary | ICD-10-CM

## 2015-05-03 DIAGNOSIS — M25661 Stiffness of right knee, not elsewhere classified: Secondary | ICD-10-CM

## 2015-05-03 NOTE — Therapy (Signed)
[facility] [place], [name], [address] Phone: [phone]   Fax:  [phone]  Physical Therapy Treatment  Patient Details  Name: Mary [name] MRN: [id number] Date of Birth: 24-Sep-1940 Referring Provider: Jean Rosenthal MD  Encounter Date: 05/03/2015      PT End of Session - 05/03/15 1106    Visit Number 4   Number of Visits 12   Date for PT Re-Evaluation 06/07/15   PT Start Time 0945   PT Stop Time 1100   PT Time Calculation (min) 75 min   Activity Tolerance Patient tolerated treatment well;Patient limited by pain   Behavior During Therapy [facility] for tasks assessed/performed      Past Medical History  Diagnosis Date  . DJD (degenerative joint disease)   . Overweight(278.02)   . Hyperlipidemia   . Hypertension   . Shortness of breath dyspnea     with exertion  . Diabetes mellitus without complication ([place])   . Stroke [facility])     " light stroke"  . Sleep apnea   . Anxiety   . Wears glasses   . Diabetic neuropathy ([place])   . Diabetic neuropathy ([place])   . Neuromuscular disorder ([place])     neuropathy in feet    Past Surgical History  Procedure Laterality Date  . Ankle surgery      Left tendon repair  . Aortic valve replacement  03/1994  . Lumbar fusion    . Cervical spine surgery    . Hemorrhoid surgery    . Cardiac catheterization      [place]  . Colonoscopy    . Knee arthroscopy w/ meniscectomy Right 04/13/2015  . Knee arthroscopy Right 04/13/2015    Procedure: RIGHT KNEE ARTHROSCOPY WITH DEBRIDEMENT AND PARTIAL MEDIAL MENISCECTOMY;  Surgeon: Mcarthur Rossetti, MD;  Location: [place];  Service: Orthopedics;  Laterality: Right;    There were no vitals filed for this visit.  Visit Diagnosis:  Knee pain, chronic, right  Decreased range of motion of right knee  Swelling of right knee joint  Debility      Subjective Assessment - 05/03/15 1057    Subjective My pain a 9/10 today.   Limitations Walking   Patient Stated Goals Want to reduce my knee pain and do the things I use to do.   Pain Score 9    Pain Location Knee   Pain Orientation Right   Pain Descriptors / Indicators Aching;Burning   Pain Type Surgical pain   Pain Frequency Intermittent                         [facility] Adult PT Treatment/Exercise - 05/03/15 0001    Exercises   Exercises Knee/Hip   Knee/Hip Exercises: Aerobic   Stationary Bike 23 minutes for PROM/stretching attempting backward revolutions but unable to complete.   Acupuncturist Location Right knee.   Electrical Stimulation Action Pre-mod   Electrical Stimulation Parameters Constant at 1-10 HZ.x 20 minutes.   Electrical Stimulation Goals Edema;Pain   Manual Therapy   Soft tissue mobilization PROM and STW/M to right quadriceps x 15 minutes.                  PT Short Term Goals - 04/28/15 1526    PT SHORT TERM GOAL #1   Title Ind wiht a HEP.   Time 2   Period Weeks   Status On-going   PT  SHORT TERM GOAL #2   Title Full active right knee extension.   Time 2   Period Weeks   Status On-going  R knee AROM -5 deg from neutral 04/28/2015           PT Long Term Goals - 04/28/15 1527    PT LONG TERM GOAL #1   Title Active right knee flexion to 115 degrees+ so the patient can perform functional tasks and do so with pain not > 2-3/10.   Time 4   Period Weeks   Status On-going  AROM R knee flexion 52 deg as of 04/28/2015   PT LONG TERM GOAL #2   Title Increase right knee strength to a solid 4+/5 to provide good stability for accomplishment of functional activities   Time 4   Period Weeks   Status On-going   PT LONG TERM GOAL #3   Title Decrease edema to within 2 cms of non-affected side to assist with pain reduction and range of motion gains.   Time 4   Period Weeks   Status On-going   PT LONG TERM GOAL #4   Title Perform a reciprocating stair gait with one railing with  pain not > 2-3/10.   Time 4   Period Weeks   Status On-going   PT LONG TERM GOAL #5   Title Walk in clinic 500 feet with a straight cane without deviation.   Time 4   Period Weeks   Status On-going               Problem List Patient Active Problem List   Diagnosis Date Noted  . S/P right knee arthroscopy 04/13/2015  . OSA (obstructive sleep apnea) 01/01/2013  . Diabetes ([place]) 10/23/2012  . Aortic valve disorder 06/30/2010  . CVA (cerebral vascular accident) ([place]) 06/30/2010  . S/P aortic valve replacement 06/30/2010  . HYPERLIPIDEMIA 09/29/2008  . OVERWEIGHT 09/29/2008  . HYPERTENSION 09/29/2008  . DEGENERATIVE JOINT DISEASE 09/29/2008    Raye Slyter, [place] MPT 05/03/2015, 11:15 AM  [facility] [place], [name], [address] Phone: [phone]   Fax:  [phone]  Name: Mary [name] MRN: [id number] Date of Birth: Oct 22, 1940

## 2015-05-05 ENCOUNTER — Ambulatory Visit: Payer: Medicare Other | Admitting: Physical Therapy

## 2015-05-05 DIAGNOSIS — R5381 Other malaise: Secondary | ICD-10-CM

## 2015-05-05 DIAGNOSIS — M25561 Pain in right knee: Secondary | ICD-10-CM | POA: Diagnosis not present

## 2015-05-05 DIAGNOSIS — M25661 Stiffness of right knee, not elsewhere classified: Secondary | ICD-10-CM

## 2015-05-05 DIAGNOSIS — G8929 Other chronic pain: Secondary | ICD-10-CM

## 2015-05-05 DIAGNOSIS — M25461 Effusion, right knee: Secondary | ICD-10-CM

## 2015-05-05 NOTE — Therapy (Signed)
[facility] [place], [name], [address] Phone: [phone]   Fax:  [phone]  Physical Therapy Treatment  Patient Details  Name: Mary [name] MRN: [id number] Date of Birth: 1940/06/12 Referring Provider: Jean Rosenthal MD  Encounter Date: 05/05/2015      PT End of Session - 05/05/15 1719    Visit Number 5   Number of Visits 12   PT Start Time 0148   PT Stop Time 0245   PT Time Calculation (min) 57 min   Activity Tolerance Patient tolerated treatment well;Patient limited by pain      Past Medical History  Diagnosis Date  . DJD (degenerative joint disease)   . Overweight(278.02)   . Hyperlipidemia   . Hypertension   . Shortness of breath dyspnea     with exertion  . Diabetes mellitus without complication ([place])   . Stroke [facility])     " light stroke"  . Sleep apnea   . Anxiety   . Wears glasses   . Diabetic neuropathy ([place])   . Diabetic neuropathy ([place])   . Neuromuscular disorder ([place])     neuropathy in feet    Past Surgical History  Procedure Laterality Date  . Ankle surgery      Left tendon repair  . Aortic valve replacement  03/1994  . Lumbar fusion    . Cervical spine surgery    . Hemorrhoid surgery    . Cardiac catheterization      [place]  . Colonoscopy    . Knee arthroscopy w/ meniscectomy Right 04/13/2015  . Knee arthroscopy Right 04/13/2015    Procedure: RIGHT KNEE ARTHROSCOPY WITH DEBRIDEMENT AND PARTIAL MEDIAL MENISCECTOMY;  Surgeon: Mcarthur Rossetti, MD;  Location: [facility];  Service: Orthopedics;  Laterality: Right;    There were no vitals filed for this visit.  Visit Diagnosis:  Knee pain, chronic, right  Decreased range of motion of right knee  Swelling of right knee joint  Debility      Subjective Assessment - 05/05/15 1726    Subjective Got on bike at home and went over and I screamed.  I recommended she no longer use the bike at home or in PT.  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   Limitations Walking   Patient Stated Goals Want to reduce my knee pain and do the things I use to do.   Pain Score 10-Worst pain ever   Pain Location Knee   Pain Orientation Right   Pain Descriptors / Indicators Aching;Burning   Pain Frequency Intermittent                         [facility] Adult PT Treatment/Exercise - 05/05/15 0001    Exercises   Exercises Knee/Hip;Ankle   Knee/Hip Exercises: Aerobic   Nustep Level 3 times 23 minutes moving forward x three to increase flexion.   Knee/Hip Exercises: Supine   Short Arc Quad Sets Limitations 5 minutes assisted into full extension and then focused on eccentric control.   Straight Leg Raises Limitations Assisted right SLR's several sets to fatigue.   Modalities   Modalities Moist Heat   Moist Heat Therapy   Number Minutes Moist Heat 12 Minutes   Moist Heat Location --  Right knee.   Printmaker  Electrical Stimulation Location Right knee.   Electrical Stimulation Action Pre-mod e'stim   Electrical Stimulation Parameters Constant at 80-150 Hz x 12 minutes.   Electrical Stimulation Goals Pain   Ankle Exercises: Standing   Rocker Board Limitations 5 minutes in parallel bars.                PT Education - 05/05/15 1718    Education provided Yes   Person(s) Educated Patient   Methods Explanation;Demonstration;Handout;Verbal cues;Tactile cues   Comprehension Verbalized understanding;Returned demonstration          PT Short Term Goals - 04/28/15 1526    PT SHORT TERM GOAL #1   Title Ind wiht a HEP.   Time 2   Period Weeks   Status On-going   PT SHORT TERM GOAL #2   Title Full active right knee extension.   Time 2   Period Weeks   Status On-going  R knee AROM -5 deg from neutral 04/28/2015           PT Long Term  Goals - 05/05/15 1732    PT LONG TERM GOAL #1   Title Active right knee flexion to 105 degrees+ so the patient can perform functional tasks and do so with pain not > 2-3/10.   Baseline Goal update to 105 degrees.               Problem List Patient Active Problem List   Diagnosis Date Noted  . S/P right knee arthroscopy 04/13/2015  . OSA (obstructive sleep apnea) 01/01/2013  . Diabetes ([place]) 10/23/2012  . Aortic valve disorder 06/30/2010  . CVA (cerebral vascular accident) ([place]) 06/30/2010  . S/P aortic valve replacement 06/30/2010  . HYPERLIPIDEMIA 09/29/2008  . OVERWEIGHT 09/29/2008  . HYPERTENSION 09/29/2008  . DEGENERATIVE JOINT DISEASE 09/29/2008    Reace Breshears, [place] MPT 05/05/2015, 5:37 PM  [facility] [place], [name], [address] Phone: [phone]   Fax:  [phone]  Name: IVAH CHAMORRO MRN: [id number] Date of Birth: Sep 01, 1940

## 2015-05-07 ENCOUNTER — Ambulatory Visit: Payer: Medicare Other | Admitting: Physical Therapy

## 2015-05-07 ENCOUNTER — Encounter: Payer: Self-pay | Admitting: Physical Therapy

## 2015-05-07 DIAGNOSIS — G8929 Other chronic pain: Secondary | ICD-10-CM

## 2015-05-07 DIAGNOSIS — M25561 Pain in right knee: Principal | ICD-10-CM

## 2015-05-07 DIAGNOSIS — R5381 Other malaise: Secondary | ICD-10-CM

## 2015-05-07 DIAGNOSIS — M25461 Effusion, right knee: Secondary | ICD-10-CM

## 2015-05-07 DIAGNOSIS — M25661 Stiffness of right knee, not elsewhere classified: Secondary | ICD-10-CM

## 2015-05-07 NOTE — Therapy (Signed)
[facility] [place], [name], [address] Phone: [phone]   Fax:  [phone]  Physical Therapy Treatment  Patient Details  Name: Mary [name] MRN: [id number] Date of Birth: 05-07-1940 Referring Provider: Jean Rosenthal MD  Encounter Date: 05/07/2015      PT End of Session - 05/07/15 0953    Number of Visits 12   Date for PT Re-Evaluation 06/07/15   PT Start Time 0948   PT Stop Time 1043   PT Time Calculation (min) 55 min   Activity Tolerance Patient tolerated treatment well;Patient limited by pain   Behavior During Therapy [facility] for tasks assessed/performed      Past Medical History  Diagnosis Date  . DJD (degenerative joint disease)   . Overweight(278.02)   . Hyperlipidemia   . Hypertension   . Shortness of breath dyspnea     with exertion  . Diabetes mellitus without complication ([place])   . Stroke [facility])     " light stroke"  . Sleep apnea   . Anxiety   . Wears glasses   . Diabetic neuropathy ([place])   . Diabetic neuropathy ([place])   . Neuromuscular disorder ([place])     neuropathy in feet    Past Surgical History  Procedure Laterality Date  . Ankle surgery      Left tendon repair  . Aortic valve replacement  03/1994  . Lumbar fusion    . Cervical spine surgery    . Hemorrhoid surgery    . Cardiac catheterization      [place]  . Colonoscopy    . Knee arthroscopy w/ meniscectomy Right 04/13/2015  . Knee arthroscopy Right 04/13/2015    Procedure: RIGHT KNEE ARTHROSCOPY WITH DEBRIDEMENT AND PARTIAL MEDIAL MENISCECTOMY;  Surgeon: Mcarthur Rossetti, MD;  Location: [place];  Service: Orthopedics;  Laterality: Right;    There were no vitals filed for this visit.  Visit Diagnosis:  Knee pain, chronic, right  Decreased range of motion of right knee  Swelling of right knee joint  Debility          [facility] PT Assessment - 05/07/15 0001    Assessment   Medical Diagnosis Post right knee  arthroscopic surgery   Onset Date/Surgical Date 04/13/15   Next MD Visit 04/2015   Precautions   Precautions Fall   Restrictions   Weight Bearing Restrictions No   ROM / Strength   AROM / PROM / Strength PROM   PROM   Overall PROM  Deficits   PROM Assessment Site Knee   Right/Left Knee Right   Right Knee Extension -5  at rest   Right Knee Flexion 87                     [facility] Adult PT Treatment/Exercise - 05/07/15 0001    Knee/Hip Exercises: Aerobic   Nustep L4, seat 9 x17 min   Knee/Hip Exercises: Standing   Rocker Board 3 minutes   Knee/Hip Exercises: Supine   Short Arc Quad Sets AROM;Right;2 sets;10 reps   Modalities   Modalities Network engineer Stimulation Location Right knee.   Electrical Stimulation Action Pre-Mod   Electrical Stimulation Parameters 80-150 Hz x15 min   Electrical Stimulation Goals Pain;Edema   Vasopneumatic   Number Minutes Vasopneumatic  15 minutes   Vasopnuematic Location  Knee   Vasopneumatic Pressure Medium   Vasopneumatic Temperature  37   Manual Therapy   Manual  Therapy Passive ROM;Soft tissue mobilization   Soft tissue mobilization STW to R HS/ adductors/ Quad to decrease soreness and tightness in supine   Passive ROM PROM of R knee into flexion with gentle holds at end range                  PT Short Term Goals - 05/07/15 1041    PT SHORT TERM GOAL #1   Title Ind wiht a HEP.   Time 2   Period Weeks   Status On-going   PT SHORT TERM GOAL #2   Title Full active right knee extension.   Time 2   Period Weeks   Status On-going  R knee -5 deg from neutral at rest 05/07/2015           PT Long Term Goals - 05/07/15 1042    PT LONG TERM GOAL #1   Title Active right knee flexion to 105 degrees+ so the patient can perform functional tasks and do so with pain not > 2-3/10.   Baseline Goal update to 105 degrees.   Time 4   Period Weeks   Status On-going   R knee PROM 87 deg 05/07/2015   PT LONG TERM GOAL #2   Title Increase right knee strength to a solid 4+/5 to provide good stability for accomplishment of functional activities   Time 4   Period Weeks   Status On-going   PT LONG TERM GOAL #3   Title Decrease edema to within 2 cms of non-affected side to assist with pain reduction and range of motion gains.   Time 4   Period Weeks   Status On-going   PT LONG TERM GOAL #4   Title Perform a reciprocating stair gait with one railing with pain not > 2-3/10.   Time 4   Period Weeks   Status On-going   PT LONG TERM GOAL #5   Title Walk in clinic 500 feet with a straight cane without deviation.   Time 4   Period Weeks   Status On-going               Plan - 05/07/15 1031    Clinical Impression Statement Patient tolerated today's treatment fairly well although she remains very sore and continues to have increased R knee pain. Patient continues to present with increased R knee edema as well. Patient continues to ambulate slowly with rollator into clnic. Patient continues to have limited R knee flexion although ROM measurement has improved from previous measurement. R knee ROM measurement taken today measured as 5-87 deg in supine. Patient was very sore during STW of R thigh musculature. Patient SAQ limitied with ROM and strength but R Quad contraction noted. Normal modalities response noted following removal of the modalities.    Pt will benefit from skilled therapeutic intervention in order to improve on the following deficits Pain;Decreased activity tolerance;Decreased range of motion;Decreased strength;Difficulty walking   Rehab Potential Good   PT Frequency 2x / week   PT Duration 6 weeks   PT Treatment/Interventions ADLs/Self Care Home Management;Electrical Stimulation;Cryotherapy;Therapeutic exercise;Therapeutic activities;Gait training;Ultrasound;Balance training;Neuromuscular re-education;Manual techniques;Vasopneumatic  Device;Passive range of motion   PT Next Visit Plan Continue with low level strengthening and ROM exercises with modalitiesP PRN for pain and edema per MPT POC.   Consulted and Agree with Plan of Care Patient        Problem List Patient Active Problem List   Diagnosis Date Noted  . S/P right knee arthroscopy 04/13/2015  .  OSA (obstructive sleep apnea) 01/01/2013  . Diabetes ([place]) 10/23/2012  . Aortic valve disorder 06/30/2010  . CVA (cerebral vascular accident) ([place]) 06/30/2010  . S/P aortic valve replacement 06/30/2010  . HYPERLIPIDEMIA 09/29/2008  . OVERWEIGHT 09/29/2008  . HYPERTENSION 09/29/2008  . DEGENERATIVE JOINT DISEASE 09/29/2008    Wynelle Fanny, PTA 05/07/2015, 10:46 AM  [facility] [address] [place], [name], [address] Phone: [phone]   Fax:  [phone]  Name: Mary [name] MRN: [id number] Date of Birth: December 07, 1940

## 2015-05-10 ENCOUNTER — Ambulatory Visit: Payer: Medicare Other | Admitting: Physical Therapy

## 2015-05-10 ENCOUNTER — Encounter: Payer: Self-pay | Admitting: Physical Therapy

## 2015-05-10 ENCOUNTER — Ambulatory Visit (INDEPENDENT_AMBULATORY_CARE_PROVIDER_SITE_OTHER): Payer: Medicare Other | Admitting: *Deleted

## 2015-05-10 DIAGNOSIS — Z952 Presence of prosthetic heart valve: Secondary | ICD-10-CM

## 2015-05-10 DIAGNOSIS — I359 Nonrheumatic aortic valve disorder, unspecified: Secondary | ICD-10-CM

## 2015-05-10 DIAGNOSIS — I639 Cerebral infarction, unspecified: Secondary | ICD-10-CM | POA: Diagnosis not present

## 2015-05-10 DIAGNOSIS — M25561 Pain in right knee: Secondary | ICD-10-CM | POA: Diagnosis not present

## 2015-05-10 DIAGNOSIS — R5381 Other malaise: Secondary | ICD-10-CM

## 2015-05-10 DIAGNOSIS — G8929 Other chronic pain: Secondary | ICD-10-CM

## 2015-05-10 DIAGNOSIS — M25461 Effusion, right knee: Secondary | ICD-10-CM

## 2015-05-10 DIAGNOSIS — M25661 Stiffness of right knee, not elsewhere classified: Secondary | ICD-10-CM

## 2015-05-10 DIAGNOSIS — Z954 Presence of other heart-valve replacement: Secondary | ICD-10-CM

## 2015-05-10 LAB — POCT INR: INR: 6.3

## 2015-05-10 NOTE — Therapy (Signed)
[facility] [place], [name], [address] Phone: [phone]   Fax:  [phone]  Physical Therapy Treatment  Patient Details  Name: Mary [name] MRN: [id number] Date of Birth: 05-17-1940 Referring Provider: Jean Rosenthal MD  Encounter Date: 05/10/2015      PT End of Session - 05/10/15 1108    Visit Number 6   Number of Visits 12   Date for PT Re-Evaluation 06/07/15   PT Start Time 1031   PT Stop Time 1124   PT Time Calculation (min) 53 min   Activity Tolerance Patient tolerated treatment well;Patient limited by pain   Behavior During Therapy [facility] for tasks assessed/performed      Past Medical History  Diagnosis Date  . DJD (degenerative joint disease)   . Overweight(278.02)   . Hyperlipidemia   . Hypertension   . Shortness of breath dyspnea     with exertion  . Diabetes mellitus without complication ([place])   . Stroke [facility])     " light stroke"  . Sleep apnea   . Anxiety   . Wears glasses   . Diabetic neuropathy ([place])   . Diabetic neuropathy ([place])   . Neuromuscular disorder ([place])     neuropathy in feet    Past Surgical History  Procedure Laterality Date  . Ankle surgery      Left tendon repair  . Aortic valve replacement  03/1994  . Lumbar fusion    . Cervical spine surgery    . Hemorrhoid surgery    . Cardiac catheterization      [place]  . Colonoscopy    . Knee arthroscopy w/ meniscectomy Right 04/13/2015  . Knee arthroscopy Right 04/13/2015    Procedure: RIGHT KNEE ARTHROSCOPY WITH DEBRIDEMENT AND PARTIAL MEDIAL MENISCECTOMY;  Surgeon: Mcarthur Rossetti, MD;  Location: [place];  Service: Orthopedics;  Laterality: Right;    There were no vitals filed for this visit.  Visit Diagnosis:  Knee pain, chronic, right  Decreased range of motion of right knee  Debility  Swelling of right knee joint      Subjective Assessment - 05/10/15 1040    Subjective Patient felt better after  last treatment and was able to move knee better.   Limitations Walking   Patient Stated Goals Want to reduce my knee pain and do the things I use to do.   Currently in Pain? Yes   Pain Score 5    Pain Location Knee   Pain Orientation Right   Pain Descriptors / Indicators Aching;Sore   Pain Type Surgical pain   Pain Frequency Intermittent   Aggravating Factors  increased activity    Pain Relieving Factors rest            [facility] PT Assessment - 05/10/15 0001    ROM / Strength   AROM / PROM / Strength AROM;PROM   AROM   AROM Assessment Site Knee   Right/Left Knee Right   Right Knee Extension -8   Right Knee Flexion 80   PROM   PROM Assessment Site Knee   Right/Left Knee Right   Right Knee Extension -5   Right Knee Flexion 90                     [facility] Adult PT Treatment/Exercise - 05/10/15 0001    Knee/Hip Exercises: Aerobic   Nustep L4 x 70min adjusted for ROM   Manual Therapy   Manual Therapy Passive ROM  Passive ROM PROM for right knee flexion and ext with low load holds                  PT Short Term Goals - 05/07/15 1041    PT SHORT TERM GOAL #1   Title Ind wiht a HEP.   Time 2   Period Weeks   Status On-going   PT SHORT TERM GOAL #2   Title Full active right knee extension.   Time 2   Period Weeks   Status On-going  R knee -5 deg from neutral at rest 05/07/2015           PT Long Term Goals - 05/10/15 1115    PT LONG TERM GOAL #1   Title Active right knee flexion to 105 degrees+ so the patient can perform functional tasks and do so with pain not > 2-3/10.   Baseline Goal update to 105 degrees.   Time 4   Period Weeks   Status On-going  AROM 80 degrees (05/10/15)   PT LONG TERM GOAL #2   Title Increase right knee strength to a solid 4+/5 to provide good stability for accomplishment of functional activities   Time 4   Period Weeks   Status On-going   PT LONG TERM GOAL #3   Title Decrease edema to within 2 cms of  non-affected side to assist with pain reduction and range of motion gains.   Time 4   Period Weeks   Status On-going  4cm (05/10/15)   PT LONG TERM GOAL #4   Title Perform a reciprocating stair gait with one railing with pain not > 2-3/10.   Time 4   Period Weeks   Status On-going   PT LONG TERM GOAL #5   Title Walk in clinic 500 feet with a straight cane without deviation.   Time 4   Period Weeks   Status On-going               Plan - 05/10/15 1111    Clinical Impression Statement Patient requested no other exercises today other than nustep and requested heat vs ice today. Patient has some improvement with PROM with right knee flexion today. Patient reported not doing a lot of ROM at home. Educated patient on daily self ROM to improve range and functional idependence. Unable to meet  any further goals due to edema, pain and ROM deficits.   Pt will benefit from skilled therapeutic intervention in order to improve on the following deficits Pain;Decreased activity tolerance;Decreased range of motion;Decreased strength;Difficulty walking   Rehab Potential Good   PT Frequency 2x / week   PT Duration 6 weeks   PT Treatment/Interventions ADLs/Self Care Home Management;Electrical Stimulation;Cryotherapy;Therapeutic exercise;Therapeutic activities;Gait training;Ultrasound;Balance training;Neuromuscular re-education;Manual techniques;Vasopneumatic Device;Passive range of motion   PT Next Visit Plan Continue with low level strengthening and ROM exercises with modalitiesP PRN for pain and edema per MPT POC.   Consulted and Agree with Plan of Care Patient        Problem List Patient Active Problem List   Diagnosis Date Noted  . S/P right knee arthroscopy 04/13/2015  . OSA (obstructive sleep apnea) 01/01/2013  . Diabetes ([place]) 10/23/2012  . Aortic valve disorder 06/30/2010  . CVA (cerebral vascular accident) ([place]) 06/30/2010  . S/P aortic valve replacement 06/30/2010  .  HYPERLIPIDEMIA 09/29/2008  . OVERWEIGHT 09/29/2008  . HYPERTENSION 09/29/2008  . DEGENERATIVE JOINT DISEASE 09/29/2008    Corbet Hanley P, PTA 05/10/2015, 12:03 PM  [place] [facility] [place], [name], [address] Phone: [phone]   Fax:  [phone]  Name: Mary [name] MRN: [id number] Date of Birth: 1940-09-15

## 2015-05-12 ENCOUNTER — Encounter: Payer: Self-pay | Admitting: Physical Therapy

## 2015-05-12 ENCOUNTER — Ambulatory Visit: Payer: Medicare Other | Admitting: Physical Therapy

## 2015-05-12 DIAGNOSIS — M25561 Pain in right knee: Secondary | ICD-10-CM | POA: Diagnosis not present

## 2015-05-12 DIAGNOSIS — G8929 Other chronic pain: Secondary | ICD-10-CM

## 2015-05-12 DIAGNOSIS — M25661 Stiffness of right knee, not elsewhere classified: Secondary | ICD-10-CM

## 2015-05-12 DIAGNOSIS — R5381 Other malaise: Secondary | ICD-10-CM

## 2015-05-12 DIAGNOSIS — M25461 Effusion, right knee: Secondary | ICD-10-CM

## 2015-05-12 NOTE — Patient Instructions (Signed)
Knee Extension Mobilization: Towel Prop   With rolled towel under right ankle, place _1-5___ pound weight across knee. Hold __5+__ minutes. Repeat __2-3__ times per set. Do __2__ sets per session. Do __2-4__ sessions per day.    Chair Knee Flexion   Keeping feet on floor, slide foot of operated leg back, bending knee. Hold ___30_ seconds. Repeat __5__ times. Do __2-4__ sessions a day.  Heel Slide   Bend left knee and pull heel toward buttocks. Use strap around foot and pull strap with arms to assist knee to bend further. Hold 10 secs.  Repeat ____ times. Do ____ sessions per day.

## 2015-05-12 NOTE — Therapy (Signed)
[facility] [place], [name], [address] Phone: [phone]   Fax:  [phone]  Physical Therapy Treatment  Patient Details  Name: Mary [name] MRN: [id number] Date of Birth: 10/23/1940 Referring Provider: Jean Rosenthal MD  Encounter Date: 05/12/2015      PT End of Session - 05/12/15 1013    Visit Number 7   Number of Visits 12   Date for PT Re-Evaluation 06/07/15   PT Start Time 0944   PT Stop Time 1042   PT Time Calculation (min) 58 min   Activity Tolerance Patient tolerated treatment well;Patient limited by pain   Behavior During Therapy [facility] for tasks assessed/performed      Past Medical History  Diagnosis Date  . DJD (degenerative joint disease)   . Overweight(278.02)   . Hyperlipidemia   . Hypertension   . Shortness of breath dyspnea     with exertion  . Diabetes mellitus without complication ([place])   . Stroke [facility])     " light stroke"  . Sleep apnea   . Anxiety   . Wears glasses   . Diabetic neuropathy ([place])   . Diabetic neuropathy ([place])   . Neuromuscular disorder ([place])     neuropathy in feet    Past Surgical History  Procedure Laterality Date  . Ankle surgery      Left tendon repair  . Aortic valve replacement  03/1994  . Lumbar fusion    . Cervical spine surgery    . Hemorrhoid surgery    . Cardiac catheterization      [place]  . Colonoscopy    . Knee arthroscopy w/ meniscectomy Right 04/13/2015  . Knee arthroscopy Right 04/13/2015    Procedure: RIGHT KNEE ARTHROSCOPY WITH DEBRIDEMENT AND PARTIAL MEDIAL MENISCECTOMY;  Surgeon: Mcarthur Rossetti, MD;  Location: [place];  Service: Orthopedics;  Laterality: Right;    There were no vitals filed for this visit.  Visit Diagnosis:  Knee pain, chronic, right  Decreased range of motion of right knee  Debility  Swelling of right knee joint      Subjective Assessment - 05/12/15 1002    Subjective feeling some better yet  still very sore knee   Patient Stated Goals Want to reduce my knee pain and do the things I use to do.   Currently in Pain? Yes   Pain Score 8    Pain Location Knee   Pain Orientation Right   Pain Descriptors / Indicators Aching;Sore   Pain Type Surgical pain   Pain Frequency Constant   Aggravating Factors  ROM or activity   Pain Relieving Factors rest, heat and ice            [facility] PT Assessment - 05/12/15 0001    AROM   AROM Assessment Site Knee   Right/Left Knee Right   Right Knee Extension -8   Right Knee Flexion 85   PROM   Right Knee Extension -5   Right Knee Flexion 95                     [facility] Adult PT Treatment/Exercise - 05/12/15 0001    Knee/Hip Exercises: Aerobic   Nustep L4 x 62min adjusted for ROM   Knee/Hip Exercises: Supine   Short Arc Quad Sets AROM;Strengthening;Right;3 sets;10 reps   Straight Leg Raises Strengthening;AROM;Right;10 reps   Acupuncturist Location Right knee.   [place]  Stimulation Parameters 1-10hz    Electrical Stimulation Goals Pain;Edema   Vasopneumatic   Number Minutes Vasopneumatic  15 minutes   Vasopnuematic Location  Knee   Vasopneumatic Pressure Medium   Manual Therapy   Manual Therapy Passive ROM   Passive ROM PROM for right knee flexion and ext with low load holds                PT Education - 05/12/15 1030    Education provided Yes   Education Details HEP ROM   Person(s) Educated Patient   Methods Explanation;Demonstration;Handout   Comprehension Verbalized understanding;Returned demonstration          PT Short Term Goals - 05/07/15 1041    PT SHORT TERM GOAL #1   Title Ind wiht a HEP.   Time 2   Period Weeks   Status On-going   PT SHORT TERM GOAL #2   Title Full active right knee extension.   Time 2   Period Weeks   Status On-going  R knee -5 deg from neutral at rest 05/07/2015           PT Long Term  Goals - 05/10/15 1115    PT LONG TERM GOAL #1   Title Active right knee flexion to 105 degrees+ so the patient can perform functional tasks and do so with pain not > 2-3/10.   Baseline Goal update to 105 degrees.   Time 4   Period Weeks   Status On-going  AROM 80 degrees (05/10/15)   PT LONG TERM GOAL #2   Title Increase right knee strength to a solid 4+/5 to provide good stability for accomplishment of functional activities   Time 4   Period Weeks   Status On-going   PT LONG TERM GOAL #3   Title Decrease edema to within 2 cms of non-affected side to assist with pain reduction and range of motion gains.   Time 4   Period Weeks   Status On-going  4cm (05/10/15)   PT LONG TERM GOAL #4   Title Perform a reciprocating stair gait with one railing with pain not > 2-3/10.   Time 4   Period Weeks   Status On-going   PT LONG TERM GOAL #5   Title Walk in clinic 500 feet with a straight cane without deviation.   Time 4   Period Weeks   Status On-going               Plan - 05/12/15 1017    Clinical Impression Statement Patient able to tolerate more exercises today and feels some better. patient has improved active and passive knee flexion today by 5 degrees. Patient reports "not performing HEP as she should", continued to educate patient on daily exercises and steetches to improve functional independence. HEP given today for ROM self stretches. Unable to meet any further goals due to pain, strength and ROM deficits.   Pt will benefit from skilled therapeutic intervention in order to improve on the following deficits Pain;Decreased activity tolerance;Decreased range of motion;Decreased strength;Difficulty walking   Rehab Potential Good   PT Frequency 2x / week   PT Duration 6 weeks   PT Treatment/Interventions ADLs/Self Care Home Management;Electrical Stimulation;Cryotherapy;Therapeutic exercise;Therapeutic activities;Gait training;Ultrasound;Balance training;Neuromuscular  re-education;Manual techniques;Vasopneumatic Device;Passive range of motion   PT Next Visit Plan Continue with low level strengthening and ROM exercises with modalities PRN for pain and edema per MPT POC.   Consulted and Agree with Plan of Care Patient  Problem List Patient Active Problem List   Diagnosis Date Noted  . S/P right knee arthroscopy 04/13/2015  . OSA (obstructive sleep apnea) 01/01/2013  . Diabetes ([place]) 10/23/2012  . Aortic valve disorder 06/30/2010  . CVA (cerebral vascular accident) ([place]) 06/30/2010  . S/P aortic valve replacement 06/30/2010  . HYPERLIPIDEMIA 09/29/2008  . OVERWEIGHT 09/29/2008  . HYPERTENSION 09/29/2008  . DEGENERATIVE JOINT DISEASE 09/29/2008    Phillips Climes, PTA 05/12/2015, 10:50 AM  [facility] [place], [name], [address] Phone: [phone]   Fax:  [phone]  Name: Mary [name] MRN: [id number] Date of Birth: March 09, 1941

## 2015-05-12 NOTE — Therapy (Signed)
[facility] [place], [name], [address] Phone: [phone]   Fax:  [phone]  Physical Therapy Treatment  Patient Details  Name: Mary [name] MRN: [id number] Date of Birth: 1940-05-29 Referring Provider: Jean Rosenthal MD  Encounter Date: 05/10/2015      PT End of Session - 05/12/15 1013    Visit Number 7   Number of Visits 12   Date for PT Re-Evaluation 06/07/15   PT Start Time 0944   PT Stop Time 1042   PT Time Calculation (min) 58 min   Activity Tolerance Patient tolerated treatment well;Patient limited by pain   Behavior During Therapy [facility] for tasks assessed/performed      Past Medical History  Diagnosis Date  . DJD (degenerative joint disease)   . Overweight(278.02)   . Hyperlipidemia   . Hypertension   . Shortness of breath dyspnea     with exertion  . Diabetes mellitus without complication ([place])   . Stroke [facility])     " light stroke"  . Sleep apnea   . Anxiety   . Wears glasses   . Diabetic neuropathy ([place])   . Diabetic neuropathy ([place])   . Neuromuscular disorder ([place])     neuropathy in feet    Past Surgical History  Procedure Laterality Date  . Ankle surgery      Left tendon repair  . Aortic valve replacement  03/1994  . Lumbar fusion    . Cervical spine surgery    . Hemorrhoid surgery    . Cardiac catheterization      [place]  . Colonoscopy    . Knee arthroscopy w/ meniscectomy Right 04/13/2015  . Knee arthroscopy Right 04/13/2015    Procedure: RIGHT KNEE ARTHROSCOPY WITH DEBRIDEMENT AND PARTIAL MEDIAL MENISCECTOMY;  Surgeon: Mcarthur Rossetti, MD;  Location: [place];  Service: Orthopedics;  Laterality: Right;    There were no vitals filed for this visit.  Visit Diagnosis:  Knee pain, chronic, right  Decreased range of motion of right knee  Debility  Swelling of right knee joint      Subjective Assessment - 05/12/15 1002    Subjective feeling some better yet  still very sore knee   Patient Stated Goals Want to reduce my knee pain and do the things I use to do.   Currently in Pain? Yes   Pain Score 8    Pain Location Knee   Pain Orientation Right   Pain Descriptors / Indicators Aching;Sore   Pain Type Surgical pain   Pain Frequency Constant   Aggravating Factors  ROM or activity   Pain Relieving Factors rest, heat and ice            [facility] PT Assessment - 05/12/15 0001    AROM   AROM Assessment Site Knee   Right/Left Knee Right   Right Knee Extension -8   Right Knee Flexion 85   PROM   Right Knee Extension -5   Right Knee Flexion 95                     [facility] Adult PT Treatment/Exercise - 05/12/15 0001    Knee/Hip Exercises: Aerobic   Nustep L4 x 15min adjusted for ROM   Knee/Hip Exercises: Supine   Short Arc Quad Sets AROM;Strengthening;Right;3 sets;10 reps   Straight Leg Raises Strengthening;AROM;Right;10 reps   Acupuncturist Location Right knee.   [place]  Stimulation Parameters 1-10hz    Electrical Stimulation Goals Pain;Edema   Vasopneumatic   Number Minutes Vasopneumatic  15 minutes   Vasopnuematic Location  Knee   Vasopneumatic Pressure Medium   Manual Therapy   Manual Therapy Passive ROM   Passive ROM PROM for right knee flexion and ext with low load holds                PT Education - 05/12/15 1030    Education provided Yes   Education Details HEP ROM   Person(s) Educated Patient   Methods Explanation;Demonstration;Handout   Comprehension Verbalized understanding;Returned demonstration          PT Short Term Goals - 05/07/15 1041    PT SHORT TERM GOAL #1   Title Ind wiht a HEP.   Time 2   Period Weeks   Status On-going   PT SHORT TERM GOAL #2   Title Full active right knee extension.   Time 2   Period Weeks   Status On-going  R knee -5 deg from neutral at rest 05/07/2015           PT Long Term  Goals - 05/10/15 1115    PT LONG TERM GOAL #1   Title Active right knee flexion to 105 degrees+ so the patient can perform functional tasks and do so with pain not > 2-3/10.   Baseline Goal update to 105 degrees.   Time 4   Period Weeks   Status On-going  AROM 80 degrees (05/10/15)   PT LONG TERM GOAL #2   Title Increase right knee strength to a solid 4+/5 to provide good stability for accomplishment of functional activities   Time 4   Period Weeks   Status On-going   PT LONG TERM GOAL #3   Title Decrease edema to within 2 cms of non-affected side to assist with pain reduction and range of motion gains.   Time 4   Period Weeks   Status On-going  4cm (05/10/15)   PT LONG TERM GOAL #4   Title Perform a reciprocating stair gait with one railing with pain not > 2-3/10.   Time 4   Period Weeks   Status On-going   PT LONG TERM GOAL #5   Title Walk in clinic 500 feet with a straight cane without deviation.   Time 4   Period Weeks   Status On-going               Plan - 05/12/15 1017    Clinical Impression Statement Patient able to tolerate more exercises today and feels some better. patient has improved active and passive knee flexion today by 5 degrees. Patient reports "not performing HEP as she should", continued to educate patient on daily exercises and steetches to improve functional independence. HEP given today for ROM self stretches. Unable to meet any further goals due to pain, strength and ROM deficits.   Pt will benefit from skilled therapeutic intervention in order to improve on the following deficits Pain;Decreased activity tolerance;Decreased range of motion;Decreased strength;Difficulty walking   Rehab Potential Good   PT Frequency 2x / week   PT Duration 6 weeks   PT Treatment/Interventions ADLs/Self Care Home Management;Electrical Stimulation;Cryotherapy;Therapeutic exercise;Therapeutic activities;Gait training;Ultrasound;Balance training;Neuromuscular  re-education;Manual techniques;Vasopneumatic Device;Passive range of motion   PT Next Visit Plan Continue with low level strengthening and ROM exercises with modalitiesP PRN for pain and edema per MPT POC.   Consulted and Agree with Plan of Care Patient  Problem List Patient Active Problem List   Diagnosis Date Noted  . S/P right knee arthroscopy 04/13/2015  . OSA (obstructive sleep apnea) 01/01/2013  . Diabetes ([place]) 10/23/2012  . Aortic valve disorder 06/30/2010  . CVA (cerebral vascular accident) ([place]) 06/30/2010  . S/P aortic valve replacement 06/30/2010  . HYPERLIPIDEMIA 09/29/2008  . OVERWEIGHT 09/29/2008  . HYPERTENSION 09/29/2008  . DEGENERATIVE JOINT DISEASE 09/29/2008    Phillips Climes 05/12/2015, 10:34 AM  [facility] [place], [name], [address] Phone: [phone]   Fax:  [phone]  Name: ANGELAMARIE CSIZMADIA MRN: [id number] Date of Birth: 1941/01/08

## 2015-05-14 ENCOUNTER — Encounter: Payer: Self-pay | Admitting: *Deleted

## 2015-05-14 ENCOUNTER — Ambulatory Visit: Payer: Medicare Other | Admitting: *Deleted

## 2015-05-14 DIAGNOSIS — M25561 Pain in right knee: Secondary | ICD-10-CM | POA: Diagnosis not present

## 2015-05-14 DIAGNOSIS — R5381 Other malaise: Secondary | ICD-10-CM

## 2015-05-14 DIAGNOSIS — M25461 Effusion, right knee: Secondary | ICD-10-CM

## 2015-05-14 DIAGNOSIS — G8929 Other chronic pain: Secondary | ICD-10-CM

## 2015-05-14 DIAGNOSIS — M25661 Stiffness of right knee, not elsewhere classified: Secondary | ICD-10-CM

## 2015-05-14 NOTE — Therapy (Signed)
[facility] [place], [name], [address] Phone: [phone]   Fax:  [phone]  Physical Therapy Treatment  Patient Details  Name: Mary [name] MRN: [id number] Date of Birth: [birth year] Referring Provider: Jean Rosenthal MD  Encounter Date: 05/14/2015      PT End of Session - 05/14/15 1014    Visit Number 8   Number of Visits 12   Date for PT Re-Evaluation 06/07/15   PT Start Time 0945   PT Stop Time 4401   PT Time Calculation (min) 59 min      Past Medical History  Diagnosis Date  . DJD (degenerative joint disease)   . Overweight(278.02)   . Hyperlipidemia   . Hypertension   . Shortness of breath dyspnea     with exertion  . Diabetes mellitus without complication ([place])   . Stroke [facility])     " light stroke"  . Sleep apnea   . Anxiety   . Wears glasses   . Diabetic neuropathy ([place])   . Diabetic neuropathy ([place])   . Neuromuscular disorder ([place])     neuropathy in feet    Past Surgical History  Procedure Laterality Date  . Ankle surgery      Left tendon repair  . Aortic valve replacement  03/1994  . Lumbar fusion    . Cervical spine surgery    . Hemorrhoid surgery    . Cardiac catheterization      [place]  . Colonoscopy    . Knee arthroscopy w/ meniscectomy Right 04/13/2015  . Knee arthroscopy Right 04/13/2015    Procedure: RIGHT KNEE ARTHROSCOPY WITH DEBRIDEMENT AND PARTIAL MEDIAL MENISCECTOMY;  Surgeon: Mcarthur Rossetti, MD;  Location: [place];  Service: Orthopedics;  Laterality: Right;    There were no vitals filed for this visit.  Visit Diagnosis:  Knee pain, chronic, right  Decreased range of motion of right knee  Debility  Swelling of right knee joint      Subjective Assessment - 05/14/15 0952    Subjective feeling some better yet still very sore knee   Limitations Walking   Pain Score 7    Pain Location Knee   Pain Orientation Right   Pain Descriptors / Indicators  Aching;Sore   Pain Type Surgical pain   Pain Frequency Constant                         [facility] Adult PT Treatment/Exercise - 05/14/15 0001    Exercises   Exercises Knee/Hip;Ankle   Knee/Hip Exercises: Aerobic   Nustep L4 x 20 min adjusted for ROM seat 11   Knee/Hip Exercises: Standing   Rocker Board 3 minutes   Rocker Board Limitations --  PF/DF and calf stretching   Knee/Hip Exercises: Seated   Long Arc Quad Strengthening;Right;2 sets;10 reps  2#   Knee/Hip Exercises: Supine   Short Arc Quad Sets AROM;Strengthening;Right;10 reps;2 sets  2#   Straight Leg Raises Strengthening;AROM;Right;10 reps   Modalities   Modalities Designer, multimedia Location Right knee.premod x 15 mins 1-'10hz'    Electrical Stimulation Goals Pain;Edema   Vasopneumatic   Number Minutes Vasopneumatic  15 minutes   Vasopnuematic Location  Knee   Vasopneumatic Pressure Medium   Vasopneumatic Temperature  34   Manual Therapy   Manual Therapy Passive ROM   Passive ROM PROM for right knee flexion and ext with low load holds  PT Short Term Goals - 05/07/15 1041    PT SHORT TERM GOAL #1   Title Ind wiht a HEP.   Time 2   Period Weeks   Status On-going   PT SHORT TERM GOAL #2   Title Full active right knee extension.   Time 2   Period Weeks   Status On-going  R knee -5 deg from neutral at rest 05/07/2015           PT Long Term Goals - 05/10/15 1115    PT LONG TERM GOAL #1   Title Active right knee flexion to 105 degrees+ so the patient can perform functional tasks and do so with pain not > 2-3/10.   Baseline Goal update to 105 degrees.   Time 4   Period Weeks   Status On-going  AROM 80 degrees (05/10/15)   PT LONG TERM GOAL #2   Title Increase right knee strength to a solid 4+/5 to provide good stability for accomplishment of functional activities   Time 4   Period Weeks   Status  On-going   PT LONG TERM GOAL #3   Title Decrease edema to within 2 cms of non-affected side to assist with pain reduction and range of motion gains.   Time 4   Period Weeks   Status On-going  4cm (05/10/15)   PT LONG TERM GOAL #4   Title Perform a reciprocating stair gait with one railing with pain not > 2-3/10.   Time 4   Period Weeks   Status On-going   PT LONG TERM GOAL #5   Title Walk in clinic 500 feet with a straight cane without deviation.   Time 4   Period Weeks   Status On-going               Plan - 05/14/15 1110    Clinical Impression Statement Pt did fair today and was able to tolerate and perform exs for RT knee a little better. She was able to progress with resistance today for strengthening. Pain and ROM deficits still limit her with functional tasks. Her PROM was only 4-95 degrees and still has a 10 degree extension lag. No new goals met today    Pt will benefit from skilled therapeutic intervention in order to improve on the following deficits Pain;Decreased activity tolerance;Decreased range of motion;Decreased strength;Difficulty walking   Rehab Potential Good   PT Frequency 2x / week   PT Duration 6 weeks   PT Treatment/Interventions ADLs/Self Care Home Management;Electrical Stimulation;Cryotherapy;Therapeutic exercise;Therapeutic activities;Gait training;Ultrasound;Balance training;Neuromuscular re-education;Manual techniques;Vasopneumatic Device;Passive range of motion   PT Next Visit Plan Continue with low level strengthening and ROM exercises with modalities PRN for pain and edema per MPT POC.   Consulted and Agree with Plan of Care Patient        Problem List Patient Active Problem List   Diagnosis Date Noted  . S/P right knee arthroscopy 04/13/2015  . OSA (obstructive sleep apnea) 01/01/2013  . Diabetes ([place]) 10/23/2012  . Aortic valve disorder 06/30/2010  . CVA (cerebral vascular accident) ([place]) 06/30/2010  . S/P aortic valve replacement  06/30/2010  . HYPERLIPIDEMIA 09/29/2008  . OVERWEIGHT 09/29/2008  . HYPERTENSION 09/29/2008  . DEGENERATIVE JOINT DISEASE 09/29/2008    Aleasha Fregeau,CHRIS, PTA 05/14/2015, 12:24 PM  [facility] [address] [place], [name], [address] Phone: [phone]   Fax:  [phone]  Name: Mary [name] MRN: [id number] Date of Birth: 1940-08-15

## 2015-05-17 ENCOUNTER — Ambulatory Visit (INDEPENDENT_AMBULATORY_CARE_PROVIDER_SITE_OTHER): Payer: Medicare Other | Admitting: *Deleted

## 2015-05-17 DIAGNOSIS — I639 Cerebral infarction, unspecified: Secondary | ICD-10-CM | POA: Diagnosis not present

## 2015-05-17 DIAGNOSIS — Z954 Presence of other heart-valve replacement: Secondary | ICD-10-CM

## 2015-05-17 DIAGNOSIS — I359 Nonrheumatic aortic valve disorder, unspecified: Secondary | ICD-10-CM

## 2015-05-17 DIAGNOSIS — Z952 Presence of prosthetic heart valve: Secondary | ICD-10-CM

## 2015-05-17 LAB — POCT INR: INR: 4.1

## 2015-05-19 ENCOUNTER — Encounter: Payer: Self-pay | Admitting: Physical Therapy

## 2015-05-19 ENCOUNTER — Ambulatory Visit: Payer: Medicare Other | Attending: Orthopaedic Surgery | Admitting: Physical Therapy

## 2015-05-19 DIAGNOSIS — R5381 Other malaise: Secondary | ICD-10-CM | POA: Insufficient documentation

## 2015-05-19 DIAGNOSIS — R29898 Other symptoms and signs involving the musculoskeletal system: Secondary | ICD-10-CM | POA: Diagnosis present

## 2015-05-19 DIAGNOSIS — G8929 Other chronic pain: Secondary | ICD-10-CM | POA: Insufficient documentation

## 2015-05-19 DIAGNOSIS — M25661 Stiffness of right knee, not elsewhere classified: Secondary | ICD-10-CM | POA: Diagnosis present

## 2015-05-19 DIAGNOSIS — M25461 Effusion, right knee: Secondary | ICD-10-CM

## 2015-05-19 DIAGNOSIS — M25561 Pain in right knee: Secondary | ICD-10-CM | POA: Insufficient documentation

## 2015-05-19 NOTE — Therapy (Signed)
[facility] [place], [name], [address] Phone: [phone]   Fax:  [phone]  Physical Therapy Treatment  Patient Details  Name: Mary [name] MRN: [id number] Date of Birth: 01-15-1941 Referring Provider: Jean Rosenthal MD  Encounter Date: 05/19/2015      PT End of Session - 05/19/15 1324    Visit Number 9   Number of Visits 12   Date for PT Re-Evaluation 06/07/15   PT Start Time [id number]   PT Stop Time 1326   PT Time Calculation (min) 55 min   Activity Tolerance Patient tolerated treatment well;Patient limited by fatigue   Behavior During Therapy [facility] for tasks assessed/performed      Past Medical History  Diagnosis Date  . DJD (degenerative joint disease)   . Overweight(278.02)   . Hyperlipidemia   . Hypertension   . Shortness of breath dyspnea     with exertion  . Diabetes mellitus without complication ([place])   . Stroke [facility])     " light stroke"  . Sleep apnea   . Anxiety   . Wears glasses   . Diabetic neuropathy ([place])   . Diabetic neuropathy ([place])   . Neuromuscular disorder ([place])     neuropathy in feet    Past Surgical History  Procedure Laterality Date  . Ankle surgery      Left tendon repair  . Aortic valve replacement  03/1994  . Lumbar fusion    . Cervical spine surgery    . Hemorrhoid surgery    . Cardiac catheterization      [place]  . Colonoscopy    . Knee arthroscopy w/ meniscectomy Right 04/13/2015  . Knee arthroscopy Right 04/13/2015    Procedure: RIGHT KNEE ARTHROSCOPY WITH DEBRIDEMENT AND PARTIAL MEDIAL MENISCECTOMY;  Surgeon: Mcarthur Rossetti, MD;  Location: [facility];  Service: Orthopedics;  Laterality: Right;    There were no vitals filed for this visit.  Visit Diagnosis:  Knee pain, chronic, right  Decreased range of motion of right knee  Debility  Swelling of right knee joint      Subjective Assessment - 05/19/15 1233    Subjective Patient reported going to  MD and is to continue with therapy, knee is progressing well per MD.   Limitations Walking   Patient Stated Goals Want to reduce my knee pain and do the things I use to do.   Pain Score 6    Pain Location Knee   Pain Orientation Right   Pain Descriptors / Indicators Aching;Sore   Pain Type Surgical pain   Pain Frequency Constant   Aggravating Factors  ROM or activity   Pain Relieving Factors rest            [facility] PT Assessment - 05/19/15 0001    AROM   AROM Assessment Site Knee   Right/Left Knee Right   Right Knee Extension -5   Right Knee Flexion 87   PROM   PROM Assessment Site Knee   Right/Left Knee Right   Right Knee Extension 0   Right Knee Flexion 97                     [facility] Adult PT Treatment/Exercise - 05/19/15 0001    Knee/Hip Exercises: Stretches   Knee: Self-Stretch to increase Flexion Left;2 reps;10 seconds   Knee/Hip Exercises: Aerobic   Nustep L4 x 15 min adjusted for ROM seat 11   Knee/Hip Exercises: Standing   Rocker  Board 2 minutes   Knee/Hip Exercises: Seated   Long Arc Quad Strengthening;Left;2 sets;10 reps   Long Arc Quad Weight 3 lbs.   Knee/Hip Exercises: Supine   Straight Leg Raises Strengthening;AROM;Right;10 reps   Moist Heat Therapy   Number Minutes Moist Heat 15 Minutes   Moist Heat Location Knee   Electrical Stimulation   Electrical Stimulation Location right knee   Electrical Stimulation Action premod   Electrical Stimulation Parameters 1-10hz    Electrical Stimulation Goals Pain;Edema   Manual Therapy   Manual Therapy Passive ROM   Passive ROM PROM for right knee flexion and ext with low load holds                  PT Short Term Goals - 05/19/15 1259    PT SHORT TERM GOAL #1   Title Ind wiht a HEP.   Time 2   Period Weeks   Status On-going   PT SHORT TERM GOAL #2   Title Full active right knee extension.   Time 2   Period Weeks   Status On-going  AROM -5 degrees (05/19/15)           PT Long  Term Goals - 05/10/15 1115    PT LONG TERM GOAL #1   Title Active right knee flexion to 105 degrees+ so the patient can perform functional tasks and do so with pain not > 2-3/10.   Baseline Goal update to 105 degrees.   Time 4   Period Weeks   Status On-going  AROM 80 degrees (05/10/15)   PT LONG TERM GOAL #2   Title Increase right knee strength to a solid 4+/5 to provide good stability for accomplishment of functional activities   Time 4   Period Weeks   Status On-going   PT LONG TERM GOAL #3   Title Decrease edema to within 2 cms of non-affected side to assist with pain reduction and range of motion gains.   Time 4   Period Weeks   Status On-going  4cm (05/10/15)   PT LONG TERM GOAL #4   Title Perform a reciprocating stair gait with one railing with pain not > 2-3/10.   Time 4   Period Weeks   Status On-going   PT LONG TERM GOAL #5   Title Walk in clinic 500 feet with a straight cane without deviation.   Time 4   Period Weeks   Status On-going               Plan - 05/19/15 1306    Clinical Impression Statement Patient progressing with improved ROM in knee today. She is released from doctor unless she needs to return. She is to complete therapy per MD. Patient had some fatigue today. Unable to meet any further goals today due to full ROM, strength and pain deficits.   Pt will benefit from skilled therapeutic intervention in order to improve on the following deficits Pain;Decreased activity tolerance;Decreased range of motion;Decreased strength;Difficulty walking   Rehab Potential Good   PT Frequency 2x / week   PT Duration 6 weeks   PT Treatment/Interventions ADLs/Self Care Home Management;Electrical Stimulation;Cryotherapy;Therapeutic exercise;Therapeutic activities;Gait training;Ultrasound;Balance training;Neuromuscular re-education;Manual techniques;Vasopneumatic Device;Passive range of motion   PT Next Visit Plan Continue with low level strengthening and ROM exercises  with modalities PRN for pain and edema per MPT POC.   Consulted and Agree with Plan of Care Patient        Problem List Patient Active Problem List  Diagnosis Date Noted  . S/P right knee arthroscopy 04/13/2015  . OSA (obstructive sleep apnea) 01/01/2013  . Diabetes ([place]) 10/23/2012  . Aortic valve disorder 06/30/2010  . CVA (cerebral vascular accident) ([place]) 06/30/2010  . S/P aortic valve replacement 06/30/2010  . HYPERLIPIDEMIA 09/29/2008  . OVERWEIGHT 09/29/2008  . HYPERTENSION 09/29/2008  . DEGENERATIVE JOINT DISEASE 09/29/2008    Phillips Climes, PTA 05/19/2015, 1:26 PM  [facility] [place], [name], [address] Phone: [phone]   Fax:  [phone]  Name: BRITTNIE BENWAY MRN: [id number] Date of Birth: October 26, 1940

## 2015-05-21 ENCOUNTER — Ambulatory Visit: Payer: Medicare Other | Admitting: Physical Therapy

## 2015-05-21 DIAGNOSIS — R5381 Other malaise: Secondary | ICD-10-CM

## 2015-05-21 DIAGNOSIS — G8929 Other chronic pain: Secondary | ICD-10-CM

## 2015-05-21 DIAGNOSIS — M25561 Pain in right knee: Secondary | ICD-10-CM | POA: Diagnosis not present

## 2015-05-21 DIAGNOSIS — M25661 Stiffness of right knee, not elsewhere classified: Secondary | ICD-10-CM

## 2015-05-21 DIAGNOSIS — M25461 Effusion, right knee: Secondary | ICD-10-CM

## 2015-05-21 NOTE — Therapy (Signed)
[facility] [place], [name], [address] Phone: [phone]   Fax:  [phone]  Physical Therapy Treatment  Patient Details  Name: Mary [name] MRN: [id number] Date of Birth: [birth year] Referring Provider: Jean Rosenthal MD  Encounter Date: 05/21/2015      PT End of Session - 05/21/15 1041    Date for PT Re-Evaluation 06/07/15   PT Start Time 0946   PT Stop Time 1032   PT Time Calculation (min) 46 min   Activity Tolerance Patient tolerated treatment well;Patient limited by fatigue   Behavior During Therapy [facility] for tasks assessed/performed      Past Medical History  Diagnosis Date  . DJD (degenerative joint disease)   . Overweight(278.02)   . Hyperlipidemia   . Hypertension   . Shortness of breath dyspnea     with exertion  . Diabetes mellitus without complication ([place])   . Stroke [facility])     " light stroke"  . Sleep apnea   . Anxiety   . Wears glasses   . Diabetic neuropathy ([place])   . Diabetic neuropathy ([place])   . Neuromuscular disorder ([place])     neuropathy in feet    Past Surgical History  Procedure Laterality Date  . Ankle surgery      Left tendon repair  . Aortic valve replacement  03/1994  . Lumbar fusion    . Cervical spine surgery    . Hemorrhoid surgery    . Cardiac catheterization      [place]  . Colonoscopy    . Knee arthroscopy w/ meniscectomy Right 04/13/2015  . Knee arthroscopy Right 04/13/2015    Procedure: RIGHT KNEE ARTHROSCOPY WITH DEBRIDEMENT AND PARTIAL MEDIAL MENISCECTOMY;  Surgeon: Mcarthur Rossetti, MD;  Location: [facility];  Service: Orthopedics;  Laterality: Right;    There were no vitals filed for this visit.  Visit Diagnosis:  Knee pain, chronic, right  Decreased range of motion of right knee  Debility  Swelling of right knee joint                       [facility] Adult PT Treatment/Exercise - 05/21/15 0001    Exercises   Exercises Knee/Hip    Knee/Hip Exercises: Aerobic   Nustep Level 4 x 20 minutes.   Moist Heat Therapy   Number Minutes Moist Heat 15 Minutes   Moist Heat Location --  Right knee.   Acupuncturist Location --  RT knee.   Electrical Stimulation Action Pre-mod   Electrical Stimulation Parameters 80-150 HZ x 15 minutes.   Electrical Stimulation Goals Pain   Manual Therapy   Manual Therapy Passive ROM   Passive ROM PROM x 5 minutes.                  PT Short Term Goals - 05/19/15 1259    PT SHORT TERM GOAL #1   Title Ind wiht a HEP.   Time 2   Period Weeks   Status On-going   PT SHORT TERM GOAL #2   Title Full active right knee extension.   Time 2   Period Weeks   Status On-going  AROM -5 degrees (05/19/15)           PT Long Term Goals - 05/10/15 1115    PT LONG TERM GOAL #1   Title Active right knee flexion to 105 degrees+ so the patient can perform functional tasks and  do so with pain not > 2-3/10.   Baseline Goal update to 105 degrees.   Time 4   Period Weeks   Status On-going  AROM 80 degrees (05/10/15)   PT LONG TERM GOAL #2   Title Increase right knee strength to a solid 4+/5 to provide good stability for accomplishment of functional activities   Time 4   Period Weeks   Status On-going   PT LONG TERM GOAL #3   Title Decrease edema to within 2 cms of non-affected side to assist with pain reduction and range of motion gains.   Time 4   Period Weeks   Status On-going  4cm (05/10/15)   PT LONG TERM GOAL #4   Title Perform a reciprocating stair gait with one railing with pain not > 2-3/10.   Time 4   Period Weeks   Status On-going   PT LONG TERM GOAL #5   Title Walk in clinic 500 feet with a straight cane without deviation.   Time 4   Period Weeks   Status On-going                 G-Codes - 07-Jun-2015 1007    Functional Assessment Tool Used [place]      Problem List Patient Active Problem List   Diagnosis Date Noted  .  S/P right knee arthroscopy 04/13/2015  . OSA (obstructive sleep apnea) 01/01/2013  . Diabetes ([place]) 10/23/2012  . Aortic valve disorder 06/30/2010  . CVA (cerebral vascular accident) ([place]) 06/30/2010  . S/P aortic valve replacement 06/30/2010  . HYPERLIPIDEMIA 09/29/2008  . OVERWEIGHT 09/29/2008  . HYPERTENSION 09/29/2008  . DEGENERATIVE JOINT DISEASE 09/29/2008    APPLEGATE, [place] MPT 05/21/2015, 10:42 AM  [facility] [place], [name], [address] Phone: [phone]   Fax:  [phone]  Name: Mary [name] MRN: [id number] Date of Birth: 24-Aug-1940

## 2015-05-26 ENCOUNTER — Ambulatory Visit: Payer: Medicare Other | Admitting: Physical Therapy

## 2015-05-26 ENCOUNTER — Encounter: Payer: Self-pay | Admitting: Physical Therapy

## 2015-05-26 DIAGNOSIS — M25561 Pain in right knee: Secondary | ICD-10-CM | POA: Diagnosis not present

## 2015-05-26 DIAGNOSIS — G8929 Other chronic pain: Secondary | ICD-10-CM

## 2015-05-26 DIAGNOSIS — M25661 Stiffness of right knee, not elsewhere classified: Secondary | ICD-10-CM

## 2015-05-26 DIAGNOSIS — R5381 Other malaise: Secondary | ICD-10-CM

## 2015-05-26 DIAGNOSIS — M25461 Effusion, right knee: Secondary | ICD-10-CM

## 2015-05-26 NOTE — Therapy (Signed)
[facility] [place], [name], [address] Phone: [phone]   Fax:  [phone]  Physical Therapy Treatment  Patient Details  Name: Mary [name] MRN: [id number] Date of Birth: 21-Sep-1940 Referring Provider: Jean Rosenthal MD  Encounter Date: 05/26/2015      PT End of Session - 05/26/15 1057    Visit Number 12   Number of Visits 24   Date for PT Re-Evaluation 06/24/15   PT Start Time 5916   PT Stop Time 1112   PT Time Calculation (min) 57 min   Activity Tolerance Patient tolerated treatment well   Behavior During Therapy [facility] for tasks assessed/performed      Past Medical History  Diagnosis Date  . DJD (degenerative joint disease)   . Overweight(278.02)   . Hyperlipidemia   . Hypertension   . Shortness of breath dyspnea     with exertion  . Diabetes mellitus without complication ([place])   . Stroke [facility])     " light stroke"  . Sleep apnea   . Anxiety   . Wears glasses   . Diabetic neuropathy ([place])   . Diabetic neuropathy ([place])   . Neuromuscular disorder ([place])     neuropathy in feet    Past Surgical History  Procedure Laterality Date  . Ankle surgery      Left tendon repair  . Aortic valve replacement  03/1994  . Lumbar fusion    . Cervical spine surgery    . Hemorrhoid surgery    . Cardiac catheterization      [place]  . Colonoscopy    . Knee arthroscopy w/ meniscectomy Right 04/13/2015  . Knee arthroscopy Right 04/13/2015    Procedure: RIGHT KNEE ARTHROSCOPY WITH DEBRIDEMENT AND PARTIAL MEDIAL MENISCECTOMY;  Surgeon: Mcarthur Rossetti, MD;  Location: [place];  Service: Orthopedics;  Laterality: Right;    There were no vitals filed for this visit.  Visit Diagnosis:  Knee pain, chronic, right  Decreased range of motion of right knee  Debility  Swelling of right knee joint      Subjective Assessment - 05/26/15 1017    Subjective Patient reported soreness and stiffness today   Limitations Walking   Patient Stated Goals Want to reduce my knee pain and do the things I use to do.   Currently in Pain? Yes   Pain Score 8    Pain Location Knee   Pain Orientation Right   Pain Descriptors / Indicators Sore;Tightness   Pain Type Surgical pain   Pain Frequency Constant   Aggravating Factors  ROM and increased activity   Pain Relieving Factors rest            [facility] PT Assessment - 05/26/15 0001    AROM   AROM Assessment Site Knee   Right/Left Knee Right   Right Knee Extension -4   Right Knee Flexion 95   PROM   PROM Assessment Site Knee   Right/Left Knee Right   Right Knee Extension 0   Right Knee Flexion 100   Flexibility   Soft Tissue Assessment /Muscle Length --  3 1/2 cm edema difference                     [facility] Adult PT Treatment/Exercise - 05/26/15 0001    Knee/Hip Exercises: Aerobic   Nustep 11mn L4 UE/LE, monitored for progression and activity tolerance   Knee/Hip Exercises: Standing   Forward Step Up Right;Hand Hold: 2;10 reps;Step  Height: 6"   Rocker Board 2 minutes   Knee/Hip Exercises: Seated   Long Arc Quad Strengthening;Left;2 sets;10 reps   Long Arc Quad Weight 3 lbs.   Acupuncturist Location right knee   Water quality scientist Parameters 80-_0    Vasopneumatic   Number Minutes Vasopneumatic  15 minutes   Vasopnuematic Location  Knee   Vasopneumatic Pressure Medium   Manual Therapy   Manual Therapy Passive ROM   Passive ROM PROM for right knee flexion/and ext with low load holds                  PT Short Term Goals - 05/26/15 1104    PT SHORT TERM GOAL #1   Title Ind wiht a HEP.   Period Weeks   Status Achieved   PT SHORT TERM GOAL #2   Title Full active right knee extension.   Time 2   Period Weeks   Status On-going  -4 degrees (05/26/15)           PT Long Term Goals - 05/26/15 1104    PT LONG TERM GOAL #1   Title  Active right knee flexion to 105 degrees+ so the patient can perform functional tasks and do so with pain not > 2-3/10.   Baseline Goal update to 105 degrees.   Time 4   Period Weeks   Status On-going  95 degrees (05/26/15)   PT LONG TERM GOAL #2   Title Increase right knee strength to a solid 4+/5 to provide good stability for accomplishment of functional activities   Time 4   Period Weeks   Status On-going   PT LONG TERM GOAL #3   Title Decrease edema to within 2 cms of non-affected side to assist with pain reduction and range of motion gains.   Time 4   Period Weeks   Status On-going  3 1/2 cm (05/26/15)   PT LONG TERM GOAL #4   Title Perform a reciprocating stair gait with one railing with pain not > 2-3/10.   Time 4   Period Weeks   Status On-going   PT LONG TERM GOAL #5   Title Walk in clinic 500 feet with a straight cane without deviation.   Time 4   Period Weeks   Status On-going               Plan - 05/26/15 1105    Clinical Impression Statement Patient continues to progress with improved active and passive ROM in right knee.Patient has edema 3/12 cm difference in knee and has difficulty with weight bearing on right LE due to weakness. Patient reports doing HEP daily. Patient is to bring cane next treatment to perform safe gait traing activities to improve functional independence. Patient met STG #1 others ongoing due to full AROM, strength, pain and edema deficits.   Pt will benefit from skilled therapeutic intervention in order to improve on the following deficits Pain;Decreased activity tolerance;Decreased range of motion;Decreased strength;Difficulty walking   Rehab Potential Good   PT Frequency 2x / week   PT Duration 6 weeks   PT Treatment/Interventions ADLs/Self Care Home Management;Electrical Stimulation;Cryotherapy;Therapeutic exercise;Therapeutic activities;Gait training;Ultrasound;Balance training;Neuromuscular re-education;Manual techniques;Vasopneumatic  Device;Passive range of motion   PT Next Visit Plan Cont with POC for Right LE ROM, strengthening and gait traing with cane   PT Home Exercise Plan issue HEP for strengthening   Consulted and Agree with Plan of Care Patient  Problem List Patient Active Problem List   Diagnosis Date Noted  . S/P right knee arthroscopy 04/13/2015  . OSA (obstructive sleep apnea) 01/01/2013  . Diabetes ([place]) 10/23/2012  . Aortic valve disorder 06/30/2010  . CVA (cerebral vascular accident) ([place]) 06/30/2010  . S/P aortic valve replacement 06/30/2010  . HYPERLIPIDEMIA 09/29/2008  . OVERWEIGHT 09/29/2008  . HYPERTENSION 09/29/2008  . DEGENERATIVE JOINT DISEASE 09/29/2008    Mary [name], Mary [name] 05/26/2015, 11:15 AM  [facility] [place], [name], [address] Phone: [phone]   Fax:  [phone]  Name: Mary [name] MRN: [id number] Date of Birth: 23-Mar-1940

## 2015-05-27 ENCOUNTER — Ambulatory Visit: Payer: Medicare Other | Admitting: Physical Therapy

## 2015-05-27 ENCOUNTER — Encounter: Payer: Self-pay | Admitting: Physical Therapy

## 2015-05-27 DIAGNOSIS — M25661 Stiffness of right knee, not elsewhere classified: Secondary | ICD-10-CM

## 2015-05-27 DIAGNOSIS — M25461 Effusion, right knee: Secondary | ICD-10-CM

## 2015-05-27 DIAGNOSIS — G8929 Other chronic pain: Secondary | ICD-10-CM

## 2015-05-27 DIAGNOSIS — M25561 Pain in right knee: Secondary | ICD-10-CM | POA: Diagnosis not present

## 2015-05-27 DIAGNOSIS — R5381 Other malaise: Secondary | ICD-10-CM

## 2015-05-27 NOTE — Therapy (Signed)
[facility] [place], [name], [address] Phone: [phone]   Fax:  [phone]  Physical Therapy Treatment  Patient Details  Name: Mary [name] MRN: [id number] Date of Birth: [birth year] Referring Provider: Jean Rosenthal MD  Encounter Date: 05/27/2015      PT End of Session - 05/27/15 1309    Visit Number 13   Number of Visits 24   Date for PT Re-Evaluation 06/24/15   PT Start Time 1228   PT Stop Time 1324   PT Time Calculation (min) 56 min   Activity Tolerance Patient tolerated treatment well   Behavior During Therapy [facility] for tasks assessed/performed      Past Medical History  Diagnosis Date  . DJD (degenerative joint disease)   . Overweight(278.02)   . Hyperlipidemia   . Hypertension   . Shortness of breath dyspnea     with exertion  . Diabetes mellitus without complication ([place])   . Stroke [facility])     " light stroke"  . Sleep apnea   . Anxiety   . Wears glasses   . Diabetic neuropathy ([place])   . Diabetic neuropathy ([place])   . Neuromuscular disorder ([place])     neuropathy in feet    Past Surgical History  Procedure Laterality Date  . Ankle surgery      Left tendon repair  . Aortic valve replacement  03/1994  . Lumbar fusion    . Cervical spine surgery    . Hemorrhoid surgery    . Cardiac catheterization      [place]  . Colonoscopy    . Knee arthroscopy w/ meniscectomy Right 04/13/2015  . Knee arthroscopy Right 04/13/2015    Procedure: RIGHT KNEE ARTHROSCOPY WITH DEBRIDEMENT AND PARTIAL MEDIAL MENISCECTOMY;  Surgeon: Mcarthur Rossetti, MD;  Location: [place];  Service: Orthopedics;  Laterality: Right;    There were no vitals filed for this visit.  Visit Diagnosis:  Knee pain, chronic, right  Decreased range of motion of right knee  Debility  Swelling of right knee joint      Subjective Assessment - 05/27/15 1238    Subjective Patient reported she almost fell today out in yard,  she stepped in a hole and had LOB yet able to recover   Limitations Walking   Patient Stated Goals Want to reduce my knee pain and do the things I use to do.   Currently in Pain? Yes   Pain Score 10-Worst pain ever   Pain Location Knee   Pain Orientation Right   Pain Descriptors / Indicators Aching;Sore   Pain Type Surgical pain   Pain Frequency Constant   Aggravating Factors  ROM and activity   Pain Relieving Factors rest            [facility] PT Assessment - 05/27/15 0001    AROM   AROM Assessment Site Knee   Right/Left Knee Right   Right Knee Flexion 85   PROM   PROM Assessment Site Knee   Right/Left Knee Right   Right Knee Flexion 90                     [facility] Adult PT Treatment/Exercise - 05/27/15 0001    Ambulation/Gait   Ambulation/Gait Yes   Ambulation/Gait Assistance 6: Modified independent (Device/Increase time)   Ambulation Distance (Feet) 320 Feet   Assistive device Small based quad cane   Gait Pattern Step-to pattern;Decreased arm swing - right;Decreased step length -  right;Decreased weight shift to right   Ambulation Surface Indoor;Level   Gait Comments CGA   Knee/Hip Exercises: Aerobic   Nustep 46min L4 UE/LE, monitored for progression and activity tolerance   Electrical Stimulation   Electrical Stimulation Location right knee   Electrical Stimulation Action premod   Electrical Stimulation Parameters 1-10hz    Electrical Stimulation Goals Pain   Vasopneumatic   Number Minutes Vasopneumatic  15 minutes   Vasopnuematic Location  Knee   Vasopneumatic Pressure Medium   Manual Therapy   Manual Therapy Passive ROM   Passive ROM PROM for right knee flexion/and ext with low load holds                  PT Short Term Goals - 05/26/15 1104    PT SHORT TERM GOAL #1   Title Ind wiht a HEP.   Period Weeks   Status Achieved   PT SHORT TERM GOAL #2   Title Full active right knee extension.   Time 2   Period Weeks   Status On-going  -4  degrees (05/26/15)           PT Long Term Goals - 05/26/15 1104    PT LONG TERM GOAL #1   Title Active right knee flexion to 105 degrees+ so the patient can perform functional tasks and do so with pain not > 2-3/10.   Baseline Goal update to 105 degrees.   Time 4   Period Weeks   Status On-going  95 degrees (05/26/15)   PT LONG TERM GOAL #2   Title Increase right knee strength to a solid 4+/5 to provide good stability for accomplishment of functional activities   Time 4   Period Weeks   Status On-going   PT LONG TERM GOAL #3   Title Decrease edema to within 2 cms of non-affected side to assist with pain reduction and range of motion gains.   Time 4   Period Weeks   Status On-going  3 1/2 cm (05/26/15)   PT LONG TERM GOAL #4   Title Perform a reciprocating stair gait with one railing with pain not > 2-3/10.   Time 4   Period Weeks   Status On-going   PT LONG TERM GOAL #5   Title Walk in clinic 500 feet with a straight cane without deviation.   Time 4   Period Weeks   Status On-going               Plan - 05/27/15 1311    Clinical Impression Statement Patient slowly progressing due to steppng in a hole in yard causing soreness in knee today. Unable to improve ROM today due to pain. Patient was able to perform gait traing with good technique with cues for technique. Patient unable to meet any further goals due to edema, strength, pain and ROM deficis   Pt will benefit from skilled therapeutic intervention in order to improve on the following deficits Pain;Decreased activity tolerance;Decreased range of motion;Decreased strength;Difficulty walking   Rehab Potential Good   PT Frequency 2x / week   PT Duration 6 weeks   PT Treatment/Interventions ADLs/Self Care Home Management;Electrical Stimulation;Cryotherapy;Therapeutic exercise;Therapeutic activities;Gait training;Ultrasound;Balance training;Neuromuscular re-education;Manual techniques;Vasopneumatic Device;Passive range of  motion   PT Next Visit Plan Cont with POC for Right LE ROM, strengthening and gait traing with cane   Consulted and Agree with Plan of Care Patient        Problem List Patient Active Problem List   Diagnosis Date Noted  .  S/P right knee arthroscopy 04/13/2015  . OSA (obstructive sleep apnea) 01/01/2013  . Diabetes ([place]) 10/23/2012  . Aortic valve disorder 06/30/2010  . CVA (cerebral vascular accident) ([place]) 06/30/2010  . S/P aortic valve replacement 06/30/2010  . HYPERLIPIDEMIA 09/29/2008  . OVERWEIGHT 09/29/2008  . HYPERTENSION 09/29/2008  . DEGENERATIVE JOINT DISEASE 09/29/2008    Phillips Climes, PTA 05/27/2015, 1:28 PM  [facility] [address] [place], [name], [address] Phone: [phone]   Fax:  [phone]  Name: Mary [name] MRN: [id number] Date of Birth: 13-Aug-1940

## 2015-05-27 NOTE — Addendum Note (Signed)
Addended by: Klover Priestly, [place] W on: 05/27/2015 03:12 PM   Modules accepted: Orders

## 2015-05-31 ENCOUNTER — Ambulatory Visit (INDEPENDENT_AMBULATORY_CARE_PROVIDER_SITE_OTHER): Payer: Medicare Other | Admitting: *Deleted

## 2015-05-31 ENCOUNTER — Telehealth: Payer: Self-pay | Admitting: *Deleted

## 2015-05-31 DIAGNOSIS — Z954 Presence of other heart-valve replacement: Secondary | ICD-10-CM | POA: Diagnosis not present

## 2015-05-31 DIAGNOSIS — I359 Nonrheumatic aortic valve disorder, unspecified: Secondary | ICD-10-CM | POA: Diagnosis not present

## 2015-05-31 DIAGNOSIS — Z952 Presence of prosthetic heart valve: Secondary | ICD-10-CM

## 2015-05-31 DIAGNOSIS — I639 Cerebral infarction, unspecified: Secondary | ICD-10-CM | POA: Diagnosis not present

## 2015-05-31 LAB — POCT INR: INR: 4.1

## 2015-05-31 NOTE — Telephone Encounter (Signed)
See updated coumadin notes in encounter

## 2015-05-31 NOTE — Telephone Encounter (Signed)
Pt called stating  she did take a coumadin tablet on Sunday so her coumadin encounter dosing changed

## 2015-05-31 NOTE — Telephone Encounter (Signed)
Patient would like return phone call regarding coumadin dosage. / tg

## 2015-06-01 ENCOUNTER — Ambulatory Visit: Payer: Medicare Other | Admitting: Physical Therapy

## 2015-06-01 ENCOUNTER — Encounter: Payer: Self-pay | Admitting: Physical Therapy

## 2015-06-01 DIAGNOSIS — M25661 Stiffness of right knee, not elsewhere classified: Secondary | ICD-10-CM

## 2015-06-01 DIAGNOSIS — M25561 Pain in right knee: Principal | ICD-10-CM

## 2015-06-01 DIAGNOSIS — R5381 Other malaise: Secondary | ICD-10-CM

## 2015-06-01 DIAGNOSIS — M25461 Effusion, right knee: Secondary | ICD-10-CM

## 2015-06-01 DIAGNOSIS — G8929 Other chronic pain: Secondary | ICD-10-CM

## 2015-06-01 NOTE — Patient Instructions (Signed)
Knee Extension: Resisted (Sitting)    With band looped around right ankle and under other foot, straighten leg with ankle loop. Keep other leg bent to increase resistance. Repeat _10___ times per set. Do _1-3___ sets per session. Do _2-3___ sessions per day.  http://orth.exer.us/690   Copyright  VHI. All rights reserved.  Knee Flexion: Resisted (Sitting)    Sit with band under left foot and looped around ankle of supported leg. Pull unsupported leg back. Repeat __10__ times per set. Do __1-3__ sets per session. Do __2-3__ sessions per day.  http://orth.exer.us/694   Copyright  VHI. All rights reserved.

## 2015-06-01 NOTE — Therapy (Signed)
[facility] [place], [name], [address] Phone: [phone]   Fax:  [phone]  Physical Therapy Treatment  Patient Details  Name: Mary [name] MRN: [id number] Date of Birth: 1940-07-31 Referring Provider: Jean Rosenthal MD  Encounter Date: 06/01/2015      PT End of Session - 06/01/15 1346    Visit Number 14   Number of Visits 24   Date for PT Re-Evaluation 06/24/15   PT Start Time 1346   PT Stop Time 1454   PT Time Calculation (min) 68 min   Activity Tolerance Patient tolerated treatment well   Behavior During Therapy [facility] for tasks assessed/performed      Past Medical History  Diagnosis Date  . DJD (degenerative joint disease)   . Overweight(278.02)   . Hyperlipidemia   . Hypertension   . Shortness of breath dyspnea     with exertion  . Diabetes mellitus without complication ([place])   . Stroke [facility])     " light stroke"  . Sleep apnea   . Anxiety   . Wears glasses   . Diabetic neuropathy ([place])   . Diabetic neuropathy ([place])   . Neuromuscular disorder ([place])     neuropathy in feet    Past Surgical History  Procedure Laterality Date  . Ankle surgery      Left tendon repair  . Aortic valve replacement  03/1994  . Lumbar fusion    . Cervical spine surgery    . Hemorrhoid surgery    . Cardiac catheterization      [place]  . Colonoscopy    . Knee arthroscopy w/ meniscectomy Right 04/13/2015  . Knee arthroscopy Right 04/13/2015    Procedure: RIGHT KNEE ARTHROSCOPY WITH DEBRIDEMENT AND PARTIAL MEDIAL MENISCECTOMY;  Surgeon: Mcarthur Rossetti, MD;  Location: [place];  Service: Orthopedics;  Laterality: Right;    There were no vitals filed for this visit.  Visit Diagnosis:  Knee pain, chronic, right  Decreased range of motion of right knee  Debility  Swelling of right knee joint      Subjective Assessment - 06/01/15 1347    Subjective States that she has been using cane as much as she  can but knee still sore. Reports an overall improvement of at least 60%.   Limitations Walking   Patient Stated Goals Want to reduce my knee pain and do the things I use to do.   Currently in Pain? Yes   Pain Score 9    Pain Location Knee   Pain Orientation Right   Pain Descriptors / Indicators Sore   Pain Type Surgical pain   Aggravating Factors  Walking            [facility] PT Assessment - 06/01/15 0001    Assessment   Medical Diagnosis Post right knee arthroscopic surgery   Onset Date/Surgical Date 04/13/15   Next MD Visit None   ROM / Strength   AROM / PROM / Strength AROM;PROM   AROM   Overall AROM  Deficits   AROM Assessment Site Knee   Right/Left Knee Right   Right Knee Extension -5   Right Knee Flexion 96   PROM   Overall PROM  Deficits   PROM Assessment Site Knee   Right/Left Knee Right   Right Knee Extension -2   Right Knee Flexion 107                     [facility] Adult  PT Treatment/Exercise - 06/01/15 0001    Knee/Hip Exercises: Aerobic   Nustep L5 x16 min, seat 12-10 for ROM progression   Knee/Hip Exercises: Standing   Forward Step Up Right;1 set;15 reps;Hand Hold: 2;Step Height: 6"   Rocker Board 3 minutes   Knee/Hip Exercises: Seated   Long Arc Quad Strengthening;Right;2 sets;10 reps;Weights   Long Arc Quad Weight 3 lbs.   Modalities   Modalities Designer, multimedia Location R knee   Electrical Stimulation Action Pre-Mod   Electrical Stimulation Parameters 80-150 Hz x15 min   Electrical Stimulation Goals Pain;Edema   Vasopneumatic   Number Minutes Vasopneumatic  15 minutes   Vasopnuematic Location  Knee   Vasopneumatic Pressure Medium   Vasopneumatic Temperature  34   Manual Therapy   Manual Therapy Passive ROM;Soft tissue mobilization   Soft tissue mobilization STW to R HS to decrease tightness and R patellar mobilizations to promote proper mobility   Passive ROM PROM  for right knee flexion/and ext with low load holds                PT Education - 06/01/15 1455    Education provided Yes   Education Details HEP- resisted knee extension and flexion with yellow theraband   Person(s) Educated Patient   Methods Explanation;Demonstration;Verbal cues;Handout   Comprehension Verbalized understanding;Returned demonstration;Verbal cues required          PT Short Term Goals - 06/01/15 1445    PT SHORT TERM GOAL #1   Title Ind wiht a HEP.   Period Weeks   Status Achieved   PT SHORT TERM GOAL #2   Title Full active right knee extension.   Time 2   Period Weeks   Status On-going  AROM R knee -4 deg and PROM -2 deg 06/01/2015           PT Long Term Goals - 06/01/15 1445    PT LONG TERM GOAL #1   Title Active right knee flexion to 105 degrees+ so the patient can perform functional tasks and do so with pain not > 2-3/10.   Baseline Goal update to 105 degrees.   Time 4   Period Weeks   Status On-going  AROM R knee 96 deg, PROM 107 deg 06/01/2015   PT LONG TERM GOAL #2   Title Increase right knee strength to a solid 4+/5 to provide good stability for accomplishment of functional activities   Time 4   Period Weeks   Status On-going   PT LONG TERM GOAL #3   Title Decrease edema to within 2 cms of non-affected side to assist with pain reduction and range of motion gains.   Time 4   Period Weeks   Status On-going  3 1/2 cm (05/26/15)   PT LONG TERM GOAL #4   Title Perform a reciprocating stair gait with one railing with pain not > 2-3/10.   Time 4   Period Weeks   Status On-going   PT LONG TERM GOAL #5   Title Walk in clinic 500 feet with a straight cane without deviation.   Time 4   Period Weeks   Status On-going               Plan - 06/01/15 1437    Clinical Impression Statement Patient tolerated today's treatment well overall and had to complete exercises slow and careful. Completed step ups to 6" step fairly well although  she demonstrated RLE weakness  and put more weight through LLE which may have been due to the weakness. Patient also required moderate multimodal cueing to avoid R hip hike with step ups. Patient noted that she could really feel the stretch with the rockerboard stretch today. Patient's R patellar mobility was decreased in all directions which may be due to the swelling that was noted around R patella. Patient's R knee AROM measured as 5-96 deg today in supine and PROM was measured as 2-107 deg. Normal modalities response noted following removal of the modalities. Patient's SBQC elevated to proper height to assist with improving posture duirng ambulation today in clinic. Goals remain on-going at this time due to lack of full R knee ROM, strength, increased edema and dfificulty and deviation with stairs and gait. Accpeted new strengthening HEP with yellow theraband without questions and verablized understanding. Patient experienced R knee weakness following today's treatment but did not report any pain.   Pt will benefit from skilled therapeutic intervention in order to improve on the following deficits Pain;Decreased activity tolerance;Decreased range of motion;Decreased strength;Difficulty walking   Rehab Potential Good   PT Frequency 2x / week   PT Duration 6 weeks   PT Treatment/Interventions ADLs/Self Care Home Management;Electrical Stimulation;Cryotherapy;Therapeutic exercise;Therapeutic activities;Gait training;Ultrasound;Balance training;Neuromuscular re-education;Manual techniques;Vasopneumatic Device;Passive range of motion   PT Next Visit Plan Cont with POC for Right LE ROM, strengthening and gait traing with cane   PT Home Exercise Plan HEP- resisted knee extension and flexion with yellow theraband   Consulted and Agree with Plan of Care Patient        Problem List Patient Active Problem List   Diagnosis Date Noted  . S/P right knee arthroscopy 04/13/2015  . OSA (obstructive sleep apnea)  01/01/2013  . Diabetes ([place]) 10/23/2012  . Aortic valve disorder 06/30/2010  . CVA (cerebral vascular accident) ([place]) 06/30/2010  . S/P aortic valve replacement 06/30/2010  . HYPERLIPIDEMIA 09/29/2008  . OVERWEIGHT 09/29/2008  . HYPERTENSION 09/29/2008  . DEGENERATIVE JOINT DISEASE 09/29/2008    Wynelle Fanny, PTA 06/01/2015, 3:00 PM  [facility] [place], [name], [address] Phone: [phone]   Fax:  [phone]  Name: CARLEAN MAHORNEY MRN: [id number] Date of Birth: Mar 20, 1941

## 2015-06-03 ENCOUNTER — Ambulatory Visit: Payer: Medicare Other | Admitting: *Deleted

## 2015-06-03 DIAGNOSIS — G8929 Other chronic pain: Secondary | ICD-10-CM

## 2015-06-03 DIAGNOSIS — M25661 Stiffness of right knee, not elsewhere classified: Secondary | ICD-10-CM

## 2015-06-03 DIAGNOSIS — M25561 Pain in right knee: Secondary | ICD-10-CM | POA: Diagnosis not present

## 2015-06-03 DIAGNOSIS — R5381 Other malaise: Secondary | ICD-10-CM

## 2015-06-03 DIAGNOSIS — M25461 Effusion, right knee: Secondary | ICD-10-CM

## 2015-06-03 NOTE — Therapy (Signed)
[facility] [place], [name], [address] Phone: [phone]   Fax:  [phone]  Physical Therapy Treatment  Patient Details  Name: Mary [name] MRN: [id number] Date of Birth: 02-26-1941 Referring Provider: Jean Rosenthal MD  Encounter Date: 06/03/2015      PT End of Session - 06/03/15 1028    Visit Number 15   Number of Visits 24   Date for PT Re-Evaluation 06/24/15   PT Start Time 1020   PT Stop Time 1119   PT Time Calculation (min) 59 min      Past Medical History  Diagnosis Date  . DJD (degenerative joint disease)   . Overweight(278.02)   . Hyperlipidemia   . Hypertension   . Shortness of breath dyspnea     with exertion  . Diabetes mellitus without complication ([place])   . Stroke [facility])     " light stroke"  . Sleep apnea   . Anxiety   . Wears glasses   . Diabetic neuropathy ([place])   . Diabetic neuropathy ([place])   . Neuromuscular disorder ([place])     neuropathy in feet    Past Surgical History  Procedure Laterality Date  . Ankle surgery      Left tendon repair  . Aortic valve replacement  03/1994  . Lumbar fusion    . Cervical spine surgery    . Hemorrhoid surgery    . Cardiac catheterization      [place]  . Colonoscopy    . Knee arthroscopy w/ meniscectomy Right 04/13/2015  . Knee arthroscopy Right 04/13/2015    Procedure: RIGHT KNEE ARTHROSCOPY WITH DEBRIDEMENT AND PARTIAL MEDIAL MENISCECTOMY;  Surgeon: Mcarthur Rossetti, MD;  Location: [place];  Service: Orthopedics;  Laterality: Right;    There were no vitals filed for this visit.  Visit Diagnosis:  Knee pain, chronic, right  Decreased range of motion of right knee  Debility  Swelling of right knee joint      Subjective Assessment - 06/03/15 1024    Subjective States that she has been using cane as much as she can but knee still sore. Reports an overall improvement of at least 60%. I aggravated my knee stepping off the  porch the other day   Limitations Walking   Patient Stated Goals Want to reduce my knee pain and do the things I use to do.   Currently in Pain? Yes   Pain Score 5    Pain Location Knee   Pain Orientation Right   Pain Descriptors / Indicators Sore   Pain Type Surgical pain   Pain Frequency Constant   Aggravating Factors  walking   Pain Relieving Factors rest                         [facility] Adult PT Treatment/Exercise - 06/03/15 0001    Exercises   Exercises Knee/Hip   Knee/Hip Exercises: Aerobic   Nustep L5 x18 min, seat 10 for ROM progression   Knee/Hip Exercises: Standing   Forward Step Up Right;1 set;Hand Hold: 2;Step Height: 6";3 sets;10 reps   Rocker Board 5 minutes  calf stretching and balance   Knee/Hip Exercises: Seated   Long Arc Quad Strengthening;Right;10 reps;Weights;3 sets   [employer] Weight 4 lbs.   Modalities   Modalities Designer, multimedia Location R knee premod x 15 mins 1-10 hz   Electrical Stimulation Goals Pain;Edema  Vasopneumatic   Number Minutes Vasopneumatic  15 minutes   Vasopnuematic Location  Knee   Vasopneumatic Pressure Medium   Vasopneumatic Temperature  34   Manual Therapy   Passive ROM PROM for right knee flexion/and ext with low load holds. Flexion to 95 degrees and ext to 4 degrees                  PT Short Term Goals - 06/01/15 1445    PT SHORT TERM GOAL #1   Title Ind wiht a HEP.   Period Weeks   Status Achieved   PT SHORT TERM GOAL #2   Title Full active right knee extension.   Time 2   Period Weeks   Status On-going  AROM R knee -4 deg and PROM -2 deg 06/01/2015           PT Long Term Goals - 06/01/15 1445    PT LONG TERM GOAL #1   Title Active right knee flexion to 105 degrees+ so the patient can perform functional tasks and do so with pain not > 2-3/10.   Baseline Goal update to 105 degrees.   Time 4   Period Weeks    Status On-going  AROM R knee 96 deg, PROM 107 deg 06/01/2015   PT LONG TERM GOAL #2   Title Increase right knee strength to a solid 4+/5 to provide good stability for accomplishment of functional activities   Time 4   Period Weeks   Status On-going   PT LONG TERM GOAL #3   Title Decrease edema to within 2 cms of non-affected side to assist with pain reduction and range of motion gains.   Time 4   Period Weeks   Status On-going  3 1/2 cm (05/26/15)   PT LONG TERM GOAL #4   Title Perform a reciprocating stair gait with one railing with pain not > 2-3/10.   Time 4   Period Weeks   Status On-going   PT LONG TERM GOAL #5   Title Walk in clinic 500 feet with a straight cane without deviation.   Time 4   Period Weeks   Status On-going               Plan - 06/03/15 1029    Clinical Impression Statement Pt did fairly well today with Rx. She was able to perform therex for RT knee with minimal c/o pain increase. She still had notable edema in RT knee with joint effusion and floating patella. Her ROM was less today at 95 degrees of flexion.. Goals are ongoing.   Pt will benefit from skilled therapeutic intervention in order to improve on the following deficits Pain;Decreased activity tolerance;Decreased range of motion;Decreased strength;Difficulty walking   Rehab Potential Good   PT Frequency 2x / week   PT Duration 6 weeks   PT Treatment/Interventions ADLs/Self Care Home Management;Electrical Stimulation;Cryotherapy;Therapeutic exercise;Therapeutic activities;Gait training;Ultrasound;Balance training;Neuromuscular re-education;Manual techniques;Vasopneumatic Device;Passive range of motion   PT Next Visit Plan Cont with POC for Right LE ROM, strengthening and gait traing with cane                     KX MODIFIER   PT Home Exercise Plan HEP- resisted knee extension and flexion with yellow theraband   Consulted and Agree with Plan of Care Patient        Problem List Patient Active  Problem List   Diagnosis Date Noted  . S/P right knee arthroscopy 04/13/2015  .  OSA (obstructive sleep apnea) 01/01/2013  . Diabetes ([place]) 10/23/2012  . Aortic valve disorder 06/30/2010  . CVA (cerebral vascular accident) ([place]) 06/30/2010  . S/P aortic valve replacement 06/30/2010  . HYPERLIPIDEMIA 09/29/2008  . OVERWEIGHT 09/29/2008  . HYPERTENSION 09/29/2008  . DEGENERATIVE JOINT DISEASE 09/29/2008    Cornelis Kluver,CHRIS, PTA 06/03/2015, 2:30 PM  [facility] [place], [name], [address] Phone: [phone]   Fax:  [phone]  Name: DOSIA LEINWEBER MRN: [id number] Date of Birth: 05/20/1940

## 2015-06-07 ENCOUNTER — Ambulatory Visit: Payer: Medicare Other | Admitting: Physical Therapy

## 2015-06-07 DIAGNOSIS — M25561 Pain in right knee: Secondary | ICD-10-CM

## 2015-06-07 DIAGNOSIS — M25461 Effusion, right knee: Secondary | ICD-10-CM

## 2015-06-07 DIAGNOSIS — R5381 Other malaise: Secondary | ICD-10-CM

## 2015-06-07 DIAGNOSIS — G8929 Other chronic pain: Secondary | ICD-10-CM

## 2015-06-07 DIAGNOSIS — M25661 Stiffness of right knee, not elsewhere classified: Secondary | ICD-10-CM

## 2015-06-07 NOTE — Therapy (Signed)
[facility] [place], [name], [address] Phone: [phone]   Fax:  [phone]  Physical Therapy Treatment  Patient Details  Name: SANIA SEIJO MRN: [id number] Date of Birth: [birth year] Referring Provider: Jean Rosenthal MD  Encounter Date: 06/07/2015      PT End of Session - 06/07/15 0952    Visit Number 16   Number of Visits 24   Date for PT Re-Evaluation 06/24/15   PT Start Time 0947   PT Stop Time 1051   PT Time Calculation (min) 64 min   Activity Tolerance Patient tolerated treatment well   Behavior During Therapy [facility] for tasks assessed/performed      Past Medical History  Diagnosis Date  . DJD (degenerative joint disease)   . Overweight(278.02)   . Hyperlipidemia   . Hypertension   . Shortness of breath dyspnea     with exertion  . Diabetes mellitus without complication ([place])   . Stroke [facility])     " light stroke"  . Sleep apnea   . Anxiety   . Wears glasses   . Diabetic neuropathy ([place])   . Diabetic neuropathy ([place])   . Neuromuscular disorder ([place])     neuropathy in feet    Past Surgical History  Procedure Laterality Date  . Ankle surgery      Left tendon repair  . Aortic valve replacement  03/1994  . Lumbar fusion    . Cervical spine surgery    . Hemorrhoid surgery    . Cardiac catheterization      [place]  . Colonoscopy    . Knee arthroscopy w/ meniscectomy Right 04/13/2015  . Knee arthroscopy Right 04/13/2015    Procedure: RIGHT KNEE ARTHROSCOPY WITH DEBRIDEMENT AND PARTIAL MEDIAL MENISCECTOMY;  Surgeon: Mcarthur Rossetti, MD;  Location: [place];  Service: Orthopedics;  Laterality: Right;    There were no vitals filed for this visit.  Visit Diagnosis:  Decreased range of motion of right knee  Knee pain, chronic, right  Debility  Swelling of right knee joint      Subjective Assessment - 06/07/15 0953    Subjective Patient states she did not go to church yesterday  because she didn't feel confident with her cane.    Patient Stated Goals Want to reduce my knee pain and do the things I use to do.   Currently in Pain? Yes   Pain Score 5    Pain Orientation Right   Pain Type Surgical pain   Pain Frequency Constant   Aggravating Factors  walking   Pain Relieving Factors rest            [facility] PT Assessment - 06/07/15 0001    Assessment   Medical Diagnosis Post right knee arthroscopic surgery   Onset Date/Surgical Date 04/13/15   Next MD Visit None   Circumferential Edema   Circumferential - Right 50 cm  measured around pants as they couldn't be raised easily   Circumferential - Left  46 cm  measured around pants as they couldn't be raised easily   ROM / Strength   AROM / PROM / Strength AROM;PROM   AROM   AROM Assessment Site Knee   Right/Left Knee Right   Right Knee Flexion 95   PROM   PROM Assessment Site Knee   Right/Left Knee Right   Right Knee Flexion 105  [facility] Adult PT Treatment/Exercise - 06/07/15 0001    Ambulation/Gait   Ambulation/Gait Yes   Ambulation Distance (Feet) 320 Feet  120 feet done without AD and SBA   Assistive device Small based quad cane   Gait Pattern Step-through pattern   Ambulation Surface Indoor   Gait Comments Worked on 2 point gait pattern with cane; VCs at first for pattern and head up   Self-Care   Self-Care --   Other Self-Care Comments  see patient education   Knee/Hip Exercises: Aerobic   Nustep L6 x 15 min seat 10 to 9 for ROM   Knee/Hip Exercises: Supine   Heel Slides AROM;Strengthening;Right;15 reps   Modalities   Modalities Electrical Stimulation;Vasopneumatic   Electrical Stimulation   Electrical Stimulation Location R knee premod x 15 mins 1-10 hz   Electrical Stimulation Goals Pain;Edema   Vasopneumatic   Number Minutes Vasopneumatic  15 minutes   Vasopnuematic Location  Knee   Vasopneumatic Pressure Medium   Vasopneumatic Temperature  34   Manual  Therapy   Manual Therapy Edema management   Edema Management demonstrated retrograde massage to patient.                PT Education - 06/07/15 2206    Education provided Yes   Education Details Educated patient on retrograde massage for edema mgmt. Also discussed frequency of elevation in conjunction with ankle pumps. Advised pt to amb every hour around the house. Also encouraged pt to peform ROM exercises more frequently.   Person(s) Educated Patient   Methods Explanation;Demonstration   Comprehension Verbalized understanding          PT Short Term Goals - 06/07/15 2212    PT SHORT TERM GOAL #1   Title Ind wiht a HEP.   Time 2   Period Weeks   Status Achieved   PT SHORT TERM GOAL #2   Title Full active right knee extension.   Time 2   Period Weeks   Status On-going           PT Long Term Goals - 06/07/15 2212    PT LONG TERM GOAL #1   Title Active right knee flexion to 105 degrees+ so the patient can perform functional tasks and do so with pain not > 2-3/10.   Baseline Goal update to 105 degrees.   Time 4   Period Weeks   Status On-going   PT LONG TERM GOAL #2   Title Increase right knee strength to a solid 4+/5 to provide good stability for accomplishment of functional activities   Time 4   Period Weeks   Status On-going   PT LONG TERM GOAL #3   Title Decrease edema to within 2 cms of non-affected side to assist with pain reduction and range of motion gains.   Time 4   Period Weeks   Status On-going   PT LONG TERM GOAL #4   Title Perform a reciprocating stair gait with one railing with pain not > 2-3/10.   Time 4   Period Weeks   Status On-going   PT LONG TERM GOAL #5   Title Walk in clinic 500 feet with a straight cane without deviation.   Time 4   Period Weeks   Status On-going               Plan - 06/07/15 2208    Clinical Impression Statement Patient did very well with gait today. She was able to normalize gait with AD  with VCs  and was able to amb without AD and SBA. She did fatigue fairly quickly and was advised to continue using AD. Edema is still present but has decreased since evaluation. Patient is not completely compliant with HEP per her own admission and was encouraged to improve compliance. LTG's are ongoing.   Pt will benefit from skilled therapeutic intervention in order to improve on the following deficits Pain;Decreased activity tolerance;Decreased range of motion;Decreased strength;Difficulty walking   Rehab Potential Good   PT Frequency 2x / week   PT Duration 6 weeks   PT Treatment/Interventions ADLs/Self Care Home Management;Electrical Stimulation;Cryotherapy;Therapeutic exercise;Therapeutic activities;Gait training;Ultrasound;Balance training;Neuromuscular re-education;Manual techniques;Vasopneumatic Device;Passive range of motion   PT Next Visit Plan Cont with POC for Right LE ROM, strengthening and gait traing with and without cane                     KX MODIFIER   Consulted and Agree with Plan of Care Patient        Problem List Patient Active Problem List   Diagnosis Date Noted  . S/P right knee arthroscopy 04/13/2015  . OSA (obstructive sleep apnea) 01/01/2013  . Diabetes ([place]) 10/23/2012  . Aortic valve disorder 06/30/2010  . CVA (cerebral vascular accident) ([place]) 06/30/2010  . S/P aortic valve replacement 06/30/2010  . HYPERLIPIDEMIA 09/29/2008  . OVERWEIGHT 09/29/2008  . HYPERTENSION 09/29/2008  . DEGENERATIVE JOINT DISEASE 09/29/2008    Madelyn Flavors PT  06/07/2015, 10:18 PM  [facility] [address] [place], [name], [address] Phone: [phone]   Fax:  [phone]  Name: NETASHA ZUMBO MRN: [id number] Date of Birth: 06/20/1940

## 2015-06-10 ENCOUNTER — Ambulatory Visit: Payer: Medicare Other | Admitting: Physical Therapy

## 2015-06-10 DIAGNOSIS — M25561 Pain in right knee: Principal | ICD-10-CM

## 2015-06-10 DIAGNOSIS — R5381 Other malaise: Secondary | ICD-10-CM

## 2015-06-10 DIAGNOSIS — G8929 Other chronic pain: Secondary | ICD-10-CM

## 2015-06-10 DIAGNOSIS — M25661 Stiffness of right knee, not elsewhere classified: Secondary | ICD-10-CM

## 2015-06-10 DIAGNOSIS — M25461 Effusion, right knee: Secondary | ICD-10-CM

## 2015-06-10 NOTE — Therapy (Signed)
[facility] [place], [name], [address] Phone: [phone]   Fax:  [phone]  Physical Therapy Treatment  Patient Details  Name: Mary [name] MRN: [id number] Date of Birth: 1940/04/25 Referring Provider: Jean Rosenthal MD  Encounter Date: 06/10/2015      PT End of Session - 06/10/15 0950    Visit Number 17   Number of Visits 24   Date for PT Re-Evaluation 06/24/15   PT Start Time 0948   PT Stop Time 1040   PT Time Calculation (min) 52 min   Activity Tolerance Patient tolerated treatment well   Behavior During Therapy [facility] for tasks assessed/performed      Past Medical History  Diagnosis Date  . DJD (degenerative joint disease)   . Overweight(278.02)   . Hyperlipidemia   . Hypertension   . Shortness of breath dyspnea     with exertion  . Diabetes mellitus without complication ([place])   . Stroke [facility])     " light stroke"  . Sleep apnea   . Anxiety   . Wears glasses   . Diabetic neuropathy ([place])   . Diabetic neuropathy ([place])   . Neuromuscular disorder ([place])     neuropathy in feet    Past Surgical History  Procedure Laterality Date  . Ankle surgery      Left tendon repair  . Aortic valve replacement  03/1994  . Lumbar fusion    . Cervical spine surgery    . Hemorrhoid surgery    . Cardiac catheterization      [place]  . Colonoscopy    . Knee arthroscopy w/ meniscectomy Right 04/13/2015  . Knee arthroscopy Right 04/13/2015    Procedure: RIGHT KNEE ARTHROSCOPY WITH DEBRIDEMENT AND PARTIAL MEDIAL MENISCECTOMY;  Surgeon: Mcarthur Rossetti, MD;  Location: [place];  Service: Orthopedics;  Laterality: Right;    There were no vitals filed for this visit.  Visit Diagnosis:  Knee pain, chronic, right  Decreased range of motion of right knee  Debility  Swelling of right knee joint      Subjective Assessment - 06/10/15 0950    Subjective Patient states that inside she is not using a cane  and most of time she uses the walker outside at her home because she doesn't want to fall in a hole and break something.   Currently in Pain? Yes   Pain Score 5   up to 7/10 with bike   Pain Location Knee   Pain Orientation Right   Pain Descriptors / Indicators Sore   Pain Type Surgical pain                         [facility] Adult PT Treatment/Exercise - 06/10/15 0001    Ambulation/Gait   Ambulation/Gait Yes   Ambulation Distance (Feet) 480 Feet  120 ft with SBQC   Gait Pattern Decreased weight shift to left;Decreased stride length   Ambulation Surface Level;Indoor   Pre-Gait Activities exaggerated step length x 120 ft; walking toward mirror x 40 ft    Gait Comments Worked on increasing gait speed, decreasing R trunk lean and equal weight shift.   Knee/Hip Exercises: Aerobic   Stationary Bike Partial revolutions for ROM x 13 min   Knee/Hip Exercises: Standing   Other Standing Knee Exercises balance activities on foam: eyes open/closed with head turns and up/down   Modalities   Modalities Network engineer  Stimulation Location R knee premod x 15 mins 1-10 hz   Electrical Stimulation Goals Edema;Pain   Vasopneumatic   Number Minutes Vasopneumatic  15 minutes   Vasopnuematic Location  Knee   Vasopneumatic Pressure Medium   Vasopneumatic Temperature  34                PT Education - 06/10/15 1035    Education provided Yes   Education Details HEP - work on shifting weight onto R side when standing with ADLS.   Person(s) Educated Patient   Methods Explanation;Demonstration   Comprehension Verbalized understanding;Returned demonstration          PT Short Term Goals - 06/07/15 2212    PT SHORT TERM GOAL #1   Title Ind wiht a HEP.   Time 2   Period Weeks   Status Achieved   PT SHORT TERM GOAL #2   Title Full active right knee extension.   Time 2   Period Weeks   Status On-going            PT Long Term Goals - 06/07/15 2212    PT LONG TERM GOAL #1   Title Active right knee flexion to 105 degrees+ so the patient can perform functional tasks and do so with pain not > 2-3/10.   Baseline Goal update to 105 degrees.   Time 4   Period Weeks   Status On-going   PT LONG TERM GOAL #2   Title Increase right knee strength to a solid 4+/5 to provide good stability for accomplishment of functional activities   Time 4   Period Weeks   Status On-going   PT LONG TERM GOAL #3   Title Decrease edema to within 2 cms of non-affected side to assist with pain reduction and range of motion gains.   Time 4   Period Weeks   Status On-going   PT LONG TERM GOAL #4   Title Perform a reciprocating stair gait with one railing with pain not > 2-3/10.   Time 4   Period Weeks   Status On-going   PT LONG TERM GOAL #5   Title Walk in clinic 500 feet with a straight cane without deviation.   Time 4   Period Weeks   Status On-going               Plan - 06/10/15 1048    Clinical Impression Statement Patient did well with gait and balance activities today. She is able to self correct step and stride length deviations after pregait activities, however trunk lean is not completely abolished. She had difficulty on bike, unable to make full revolutions at this time.   PT Next Visit Plan Cont with POC for Right LE ROM, strengthening and gait training without cane as well as balance activities.                   KX MODIFIER        Problem List Patient Active Problem List   Diagnosis Date Noted  . S/P right knee arthroscopy 04/13/2015  . OSA (obstructive sleep apnea) 01/01/2013  . Diabetes ([place]) 10/23/2012  . Aortic valve disorder 06/30/2010  . CVA (cerebral vascular accident) ([place]) 06/30/2010  . S/P aortic valve replacement 06/30/2010  . HYPERLIPIDEMIA 09/29/2008  . OVERWEIGHT 09/29/2008  . HYPERTENSION 09/29/2008  . DEGENERATIVE JOINT DISEASE 09/29/2008    Madelyn Flavors PT   06/10/2015, 10:55 AM  [facility] [place],  [name], [address] Phone: [phone]   Fax:  [phone]  Name: Mary [name] MRN: [id number] Date of Birth: Aug 08, 1940

## 2015-06-14 ENCOUNTER — Ambulatory Visit (INDEPENDENT_AMBULATORY_CARE_PROVIDER_SITE_OTHER): Payer: Medicare Other | Admitting: *Deleted

## 2015-06-14 DIAGNOSIS — I639 Cerebral infarction, unspecified: Secondary | ICD-10-CM | POA: Diagnosis not present

## 2015-06-14 DIAGNOSIS — Z954 Presence of other heart-valve replacement: Secondary | ICD-10-CM

## 2015-06-14 DIAGNOSIS — Z952 Presence of prosthetic heart valve: Secondary | ICD-10-CM

## 2015-06-14 DIAGNOSIS — I359 Nonrheumatic aortic valve disorder, unspecified: Secondary | ICD-10-CM

## 2015-06-14 LAB — POCT INR: INR: 2.8

## 2015-06-15 ENCOUNTER — Encounter: Payer: Self-pay | Admitting: Physical Therapy

## 2015-06-15 ENCOUNTER — Ambulatory Visit: Payer: Medicare Other | Admitting: Physical Therapy

## 2015-06-15 DIAGNOSIS — M25561 Pain in right knee: Secondary | ICD-10-CM

## 2015-06-15 DIAGNOSIS — M25461 Effusion, right knee: Secondary | ICD-10-CM

## 2015-06-15 DIAGNOSIS — G8929 Other chronic pain: Secondary | ICD-10-CM

## 2015-06-15 DIAGNOSIS — M25661 Stiffness of right knee, not elsewhere classified: Secondary | ICD-10-CM

## 2015-06-15 DIAGNOSIS — R5381 Other malaise: Secondary | ICD-10-CM

## 2015-06-15 NOTE — Therapy (Addendum)
[facility] [place], [name], [address] Phone: [phone]   Fax:  [phone]  Physical Therapy Treatment  Patient Details  Name: Mary [name] MRN: [id number] Date of Birth: November 12, 1940 Referring Provider: Jean Rosenthal MD  Encounter Date: 06/15/2015      PT End of Session - 06/15/15 0930    Visit Number 18   Number of Visits 24   Date for PT Re-Evaluation 06/24/15   PT Start Time 0928   PT Stop Time 1026   PT Time Calculation (min) 58 min   Activity Tolerance Patient tolerated treatment well   Behavior During Therapy [facility] for tasks assessed/performed      Past Medical History  Diagnosis Date  . DJD (degenerative joint disease)   . Overweight(278.02)   . Hyperlipidemia   . Hypertension   . Shortness of breath dyspnea     with exertion  . Diabetes mellitus without complication ([place])   . Stroke [facility])     " light stroke"  . Sleep apnea   . Anxiety   . Wears glasses   . Diabetic neuropathy ([place])   . Diabetic neuropathy ([place])   . Neuromuscular disorder ([place])     neuropathy in feet    Past Surgical History  Procedure Laterality Date  . Ankle surgery      Left tendon repair  . Aortic valve replacement  03/1994  . Lumbar fusion    . Cervical spine surgery    . Hemorrhoid surgery    . Cardiac catheterization      [place]  . Colonoscopy    . Knee arthroscopy w/ meniscectomy Right 04/13/2015  . Knee arthroscopy Right 04/13/2015    Procedure: RIGHT KNEE ARTHROSCOPY WITH DEBRIDEMENT AND PARTIAL MEDIAL MENISCECTOMY;  Surgeon: Mcarthur Rossetti, MD;  Location: [place];  Service: Orthopedics;  Laterality: Right;    There were no vitals filed for this visit.  Visit Diagnosis:  Decreased range of motion of right knee  Knee pain, chronic, right  Debility  Swelling of right knee joint      Subjective Assessment - 06/15/15 0930    Subjective States that R knee is very stiff this morning.  States that her husband said she would be okay to get on elliptical at home and states that she got on elliptical but got right back off upon pain.   Limitations Walking   Patient Stated Goals Want to reduce my knee pain and do the things I use to do.   Currently in Pain? Yes   Pain Score 6    Pain Location Knee   Pain Orientation Right   Pain Descriptors / Indicators Other (Comment)  Stiffness   Pain Type Surgical pain            [facility] PT Assessment - 06/15/15 0001    Assessment   Medical Diagnosis Post right knee arthroscopic surgery   Onset Date/Surgical Date 04/13/15   Next MD Visit None                     [facility] Adult PT Treatment/Exercise - 06/15/15 0001    Knee/Hip Exercises: Aerobic   Nustep L6 x15 min, seat 9-8 for ROM   Knee/Hip Exercises: Standing   Hip Flexion AROM;Both;1 set;15 reps;Knee bent  for SLS stabilization of RLE   Hip Abduction AROM;Both;1 set;10 reps;Knee straight  for SLS stabilization of RLE   Forward Step Up Right;1 set;15 reps;Hand Hold: 2;Step Height: 6"  Rocker Board 3 minutes   Knee/Hip Exercises: Supine   Short Arc Quad Sets AROM;Right;2 sets;10 reps  with VMS also for improving R Quad activation and muscle    Short Arc Quad Sets Limitations pump to decrease R knee inflammation   Modalities   Modalities Designer, multimedia Location R knee   Electrical Stimulation Action VMS for R quad activation and muscle pump to decrease R knee inflammation   Electrical Stimulation Parameters 10/10, 100 usec x15 min with SAQ   Electrical Stimulation Goals Edema;Strength   Vasopneumatic   Number Minutes Vasopneumatic  15 minutes   Vasopnuematic Location  Knee   Vasopneumatic Pressure Medium   Vasopneumatic Temperature  34                  PT Short Term Goals - 06/07/15 2212    PT SHORT TERM GOAL #1   Title Ind wiht a HEP.   Time 2   Period Weeks   Status  Achieved   PT SHORT TERM GOAL #2   Title Full active right knee extension.   Time 2   Period Weeks   Status On-going           PT Long Term Goals - 06/07/15 2212    PT LONG TERM GOAL #1   Title Active right knee flexion to 105 degrees+ so the patient can perform functional tasks and do so with pain not > 2-3/10.   Baseline Goal update to 105 degrees.   Time 4   Period Weeks   Status On-going   PT LONG TERM GOAL #2   Title Increase right knee strength to a solid 4+/5 to provide good stability for accomplishment of functional activities   Time 4   Period Weeks   Status On-going   PT LONG TERM GOAL #3   Title Decrease edema to within 2 cms of non-affected side to assist with pain reduction and range of motion gains.   Time 4   Period Weeks   Status On-going   PT LONG TERM GOAL #4   Title Perform a reciprocating stair gait with one railing with pain not > 2-3/10.   Time 4   Period Weeks   Status On-going   PT LONG TERM GOAL #5   Title Walk in clinic 500 feet with a straight cane without deviation.   Time 4   Period Weeks   Status On-going               Plan - 06/15/15 1021    Clinical Impression Statement Patient presented in clinic today with reports of increased R knee stiffness and inflammation. Patient was educated to elevate RLE up with ice for example on the back of her couch to provide good elevation for 15-20 minutes. Patient completed all exercises with decreased speed today which may be due to inflammation and stiffness. Tolerated SLS/ hip strengthening exercises fairly well with decrease speed and 2 UE support to parallel bars. Extensor lag noted with attempt at R SLR and patient was then directed for SAQ to further strengthen R Quad. VMS to R Quad was completed to improve the Quad activatiion/ neuro re-education of the R Quad along with muscle pump to decrease R knee inflammation. Normal modalities response noted following removal of the modaliites. Patient  experienced "a little" R knee soreness following today's treatment. Patient was educated to not exercise on elliptical as she may not be ready for that  exercise or could reinjure herself as well as to prevent a possible fall in getting off machine.   Pt will benefit from skilled therapeutic intervention in order to improve on the following deficits Pain;Decreased activity tolerance;Decreased range of motion;Decreased strength;Difficulty walking   Rehab Potential Good   PT Frequency 2x / week   PT Duration 6 weeks   PT Treatment/Interventions ADLs/Self Care Home Management;Electrical Stimulation;Cryotherapy;Therapeutic exercise;Therapeutic activities;Gait training;Ultrasound;Balance training;Neuromuscular re-education;Manual techniques;Vasopneumatic Device;Passive range of motion   PT Next Visit Plan Cont with POC for Right LE ROM, strengthening and gait training without cane as well as balance activities.                   KX MODIFIER   PT Home Exercise Plan HEP- resisted knee extension and flexion with yellow theraband   Consulted and Agree with Plan of Care Patient        Problem List Patient Active Problem List   Diagnosis Date Noted  . S/P right knee arthroscopy 04/13/2015  . OSA (obstructive sleep apnea) 01/01/2013  . Diabetes ([place]) 10/23/2012  . Aortic valve disorder 06/30/2010  . CVA (cerebral vascular accident) ([place]) 06/30/2010  . S/P aortic valve replacement 06/30/2010  . HYPERLIPIDEMIA 09/29/2008  . OVERWEIGHT 09/29/2008  . HYPERTENSION 09/29/2008  . DEGENERATIVE JOINT DISEASE 09/29/2008    Wynelle Fanny, PTA 06/15/2015, 12:27 PM  [facility] [place], [name], [address] Phone: [phone]   Fax:  [phone]  Name: Mary [name] MRN: [id number] Date of Birth: January 14, 1941

## 2015-06-17 ENCOUNTER — Ambulatory Visit: Payer: Medicare Other | Admitting: Physical Therapy

## 2015-06-17 DIAGNOSIS — M25561 Pain in right knee: Secondary | ICD-10-CM

## 2015-06-17 DIAGNOSIS — M25661 Stiffness of right knee, not elsewhere classified: Secondary | ICD-10-CM

## 2015-06-17 NOTE — Therapy (Signed)
[facility] [place], [name], [address] Phone: [phone]   Fax:  [phone]  Physical Therapy Treatment  Patient Details  Name: Mary [name] MRN: [id number] Date of Birth: [birth year] Referring Provider: Jean Rosenthal MD  Encounter Date: 06/17/2015      PT End of Session - 06/17/15 1256    Visit Number 19   Number of Visits 24   Date for PT Re-Evaluation 06/24/15   PT Start Time 1030   PT Stop Time 1123   PT Time Calculation (min) 53 min   Activity Tolerance Patient tolerated treatment well   Behavior During Therapy [facility] for tasks assessed/performed      Past Medical History  Diagnosis Date  . DJD (degenerative joint disease)   . Overweight(278.02)   . Hyperlipidemia   . Hypertension   . Shortness of breath dyspnea     with exertion  . Diabetes mellitus without complication ([place])   . Stroke [facility])     " light stroke"  . Sleep apnea   . Anxiety   . Wears glasses   . Diabetic neuropathy ([place])   . Diabetic neuropathy ([place])   . Neuromuscular disorder ([place])     neuropathy in feet    Past Surgical History  Procedure Laterality Date  . Ankle surgery      Left tendon repair  . Aortic valve replacement  03/1994  . Lumbar fusion    . Cervical spine surgery    . Hemorrhoid surgery    . Cardiac catheterization      [place]  . Colonoscopy    . Knee arthroscopy w/ meniscectomy Right 04/13/2015  . Knee arthroscopy Right 04/13/2015    Procedure: RIGHT KNEE ARTHROSCOPY WITH DEBRIDEMENT AND PARTIAL MEDIAL MENISCECTOMY;  Surgeon: Mcarthur Rossetti, MD;  Location: [place];  Service: Orthopedics;  Laterality: Right;    There were no vitals filed for this visit.  Visit Diagnosis:  Stiffness of right knee, not elsewhere classified  Pain in right knee      Subjective Assessment - 06/17/15 1258    Subjective Was at hospital visiting yesterday and knee swelled a lot but knee feels good today.   Pain Score 2    Pain Location Knee   Pain Orientation Right   Pain Type Surgical pain                                   PT Short Term Goals - 06/07/15 2212    PT SHORT TERM GOAL #1   Title Ind wiht a HEP.   Time 2   Period Weeks   Status Achieved   PT SHORT TERM GOAL #2   Title Full active right knee extension.   Time 2   Period Weeks   Status On-going           PT Long Term Goals - 06/07/15 2212    PT LONG TERM GOAL #1   Title Active right knee flexion to 105 degrees+ so the patient can perform functional tasks and do so with pain not > 2-3/10.   Baseline Goal update to 105 degrees.   Time 4   Period Weeks   Status On-going   PT LONG TERM GOAL #2   Title Increase right knee strength to a solid 4+/5 to provide good stability for accomplishment of functional activities   Time 4   Period Weeks  Status On-going   PT LONG TERM GOAL #3   Title Decrease edema to within 2 cms of non-affected side to assist with pain reduction and range of motion gains.   Time 4   Period Weeks   Status On-going   PT LONG TERM GOAL #4   Title Perform a reciprocating stair gait with one railing with pain not > 2-3/10.   Time 4   Period Weeks   Status On-going   PT LONG TERM GOAL #5   Title Walk in clinic 500 feet with a straight cane without deviation.   Time 4   Period Weeks   Status On-going                 G-Codes - July 09, 2015 1822    Functional Limitation Mobility: Walking and moving around   Mobility: Walking and Moving Around Current Status [phone]) --   Mobility: Walking and Moving Around Goal Status [phone]) --   Mobility: Walking and Moving Around Discharge Status [phone]) --      Problem List Patient Active Problem List   Diagnosis Date Noted  . S/P right knee arthroscopy 04/13/2015  . OSA (obstructive sleep apnea) 01/01/2013  . Diabetes ([place]) 10/23/2012  . Aortic valve disorder 06/30/2010  . CVA (cerebral vascular accident) ([place])  06/30/2010  . S/P aortic valve replacement 06/30/2010  . HYPERLIPIDEMIA 09/29/2008  . OVERWEIGHT 09/29/2008  . HYPERTENSION 09/29/2008  . DEGENERATIVE JOINT DISEASE 09/29/2008  Treatment:  Nustep x 23 minutes f/b HMP x 20 minutes with pre-mod e'stim x 20 minutes at 80-150 Hz.  Patient tolerated treatment well.  Stashia Sia, [place] MPT 06/17/2015, 1:04 PM  [facility] [address] [place], [name], [address] Phone: [phone]   Fax:  [phone]  Name: Mary [name] MRN: [id number] Date of Birth: 1940-04-23

## 2015-06-21 ENCOUNTER — Ambulatory Visit: Payer: Medicare Other | Attending: Orthopaedic Surgery | Admitting: Physical Therapy

## 2015-06-21 DIAGNOSIS — R5381 Other malaise: Secondary | ICD-10-CM | POA: Diagnosis not present

## 2015-06-21 DIAGNOSIS — R29898 Other symptoms and signs involving the musculoskeletal system: Secondary | ICD-10-CM | POA: Diagnosis not present

## 2015-06-21 DIAGNOSIS — G8929 Other chronic pain: Secondary | ICD-10-CM | POA: Diagnosis not present

## 2015-06-21 DIAGNOSIS — M25461 Effusion, right knee: Secondary | ICD-10-CM | POA: Diagnosis not present

## 2015-06-21 DIAGNOSIS — M25561 Pain in right knee: Secondary | ICD-10-CM | POA: Insufficient documentation

## 2015-06-21 DIAGNOSIS — M25661 Stiffness of right knee, not elsewhere classified: Secondary | ICD-10-CM | POA: Diagnosis present

## 2015-06-21 NOTE — Therapy (Signed)
[facility] [place], [name], [address] Phone: [phone]   Fax:  [phone]  Physical Therapy Treatment  Patient Details  Name: Mary [name] MRN: [id number] Date of Birth: 1941-01-09 Referring Provider: Jean Rosenthal MD  Encounter Date: 06/21/2015      PT End of Session - 06/21/15 1035    Visit Number 20   Number of Visits 24   Date for PT Re-Evaluation 06/24/15   PT Start Time 0858   PT Stop Time 0947   PT Time Calculation (min) 49 min   Activity Tolerance Patient tolerated treatment well   Behavior During Therapy [facility] for tasks assessed/performed      Past Medical History  Diagnosis Date  . DJD (degenerative joint disease)   . Overweight(278.02)   . Hyperlipidemia   . Hypertension   . Shortness of breath dyspnea     with exertion  . Diabetes mellitus without complication ([place])   . Stroke [facility])     " light stroke"  . Sleep apnea   . Anxiety   . Wears glasses   . Diabetic neuropathy ([place])   . Diabetic neuropathy ([place])   . Neuromuscular disorder ([place])     neuropathy in feet    Past Surgical History  Procedure Laterality Date  . Ankle surgery      Left tendon repair  . Aortic valve replacement  03/1994  . Lumbar fusion    . Cervical spine surgery    . Hemorrhoid surgery    . Cardiac catheterization      [place]  . Colonoscopy    . Knee arthroscopy w/ meniscectomy Right 04/13/2015  . Knee arthroscopy Right 04/13/2015    Procedure: RIGHT KNEE ARTHROSCOPY WITH DEBRIDEMENT AND PARTIAL MEDIAL MENISCECTOMY;  Surgeon: Mcarthur Rossetti, MD;  Location: [place];  Service: Orthopedics;  Laterality: Right;    There were no vitals filed for this visit.  Visit Diagnosis:  Pain in right knee  Stiffness of right knee, not elsewhere classified      Subjective Assessment - 06/21/15 1036    Subjective I'm doing much better.  Did good with the last treatment.   Patient Stated Goals Want to  reduce my knee pain and do the things I use to do.   Pain Score 2    Pain Location Knee                                   PT Short Term Goals - 06/07/15 2212    PT SHORT TERM GOAL #1   Title Ind wiht a HEP.   Time 2   Period Weeks   Status Achieved   PT SHORT TERM GOAL #2   Title Full active right knee extension.   Time 2   Period Weeks   Status On-going           PT Long Term Goals - 06/07/15 2212    PT LONG TERM GOAL #1   Title Active right knee flexion to 105 degrees+ so the patient can perform functional tasks and do so with pain not > 2-3/10.   Baseline Goal update to 105 degrees.   Time 4   Period Weeks   Status On-going   PT LONG TERM GOAL #2   Title Increase right knee strength to a solid 4+/5 to provide good stability for accomplishment of functional activities   Time 4  Period Weeks   Status On-going   PT LONG TERM GOAL #3   Title Decrease edema to within 2 cms of non-affected side to assist with pain reduction and range of motion gains.   Time 4   Period Weeks   Status On-going   PT LONG TERM GOAL #4   Title Perform a reciprocating stair gait with one railing with pain not > 2-3/10.   Time 4   Period Weeks   Status On-going   PT LONG TERM GOAL #5   Title Walk in clinic 500 feet with a straight cane without deviation.   Time 4   Period Weeks   Status On-going                 G-Codes - 2015/07/09 1035    Functional Assessment Tool Used Clinical judgement.   Functional Limitation Mobility: Walking and moving around   Mobility: Walking and Moving Around Current Status [phone]) At least 20 percent but less than 40 percent impaired, limited or restricted   Mobility: Walking and Moving Around Goal Status [phone]) At least 20 percent but less than 40 percent impaired, limited or restricted      Problem List Patient Active Problem List   Diagnosis Date Noted  . S/P right knee arthroscopy 04/13/2015  . OSA (obstructive  sleep apnea) 01/01/2013  . Diabetes ([place]) 10/23/2012  . Aortic valve disorder 06/30/2010  . CVA (cerebral vascular accident) ([place]) 06/30/2010  . S/P aortic valve replacement 06/30/2010  . HYPERLIPIDEMIA 09/29/2008  . OVERWEIGHT 09/29/2008  . HYPERTENSION 09/29/2008  . DEGENERATIVE JOINT DISEASE 09/29/2008   Treatment:  Nustep level 5 x 24 minutes f/b pre-mod e'stim x 20 minutes.  Patient tolerated treatment without complaint.  Orien Mayhall, [place] MPT 09-Jul-2015, 1:46 PM  [facility] [address] [place], [name], [address] Phone: [phone]   Fax:  [phone]  Name: Mary [name] MRN: [id number] Date of Birth: [birth year]

## 2015-06-23 ENCOUNTER — Ambulatory Visit: Payer: Medicare Other | Admitting: Physical Therapy

## 2015-06-23 ENCOUNTER — Encounter: Payer: Self-pay | Admitting: Physical Therapy

## 2015-06-23 DIAGNOSIS — M25661 Stiffness of right knee, not elsewhere classified: Secondary | ICD-10-CM

## 2015-06-23 DIAGNOSIS — M25561 Pain in right knee: Secondary | ICD-10-CM | POA: Diagnosis not present

## 2015-06-23 NOTE — Therapy (Signed)
[facility] [place], [name], [address] Phone: [phone]   Fax:  [phone]  Physical Therapy Treatment  Patient Details  Name: Mary [name] MRN: [id number] Date of Birth: December 02, 1940 Referring Provider: Jean Rosenthal MD  Encounter Date: 06/23/2015      PT End of Session - 06/23/15 0859    Visit Number 21   Number of Visits 24   Date for PT Re-Evaluation 06/24/15   PT Start Time 0903   PT Stop Time 0950   PT Time Calculation (min) 47 min   Activity Tolerance Patient tolerated treatment well   Behavior During Therapy [facility] for tasks assessed/performed      Past Medical History  Diagnosis Date  . DJD (degenerative joint disease)   . Overweight(278.02)   . Hyperlipidemia   . Hypertension   . Shortness of breath dyspnea     with exertion  . Diabetes mellitus without complication ([place])   . Stroke [facility])     " light stroke"  . Sleep apnea   . Anxiety   . Wears glasses   . Diabetic neuropathy ([place])   . Diabetic neuropathy ([place])   . Neuromuscular disorder ([place])     neuropathy in feet    Past Surgical History  Procedure Laterality Date  . Ankle surgery      Left tendon repair  . Aortic valve replacement  03/1994  . Lumbar fusion    . Cervical spine surgery    . Hemorrhoid surgery    . Cardiac catheterization      [place]  . Colonoscopy    . Knee arthroscopy w/ meniscectomy Right 04/13/2015  . Knee arthroscopy Right 04/13/2015    Procedure: RIGHT KNEE ARTHROSCOPY WITH DEBRIDEMENT AND PARTIAL MEDIAL MENISCECTOMY;  Surgeon: Mcarthur Rossetti, MD;  Location: [place];  Service: Orthopedics;  Laterality: Right;    There were no vitals filed for this visit.  Visit Diagnosis:  Pain in right knee  Stiffness of right knee, not elsewhere classified  Decreased range of motion of right knee  Knee pain, chronic, right  Debility  Swelling of right knee joint      Subjective Assessment -  06/23/15 0912    Subjective States that in the mornings she still experiences stiffness. Also reports that she is still icing and elevating knee but swelling still occurs.   Limitations Walking   Patient Stated Goals Want to reduce my knee pain and do the things I use to do.   Currently in Pain? Yes   Pain Score 5    Pain Location Knee   Pain Orientation Right   Pain Type Surgical pain            [facility] PT Assessment - 06/23/15 0001    Assessment   Medical Diagnosis Post right knee arthroscopic surgery   Onset Date/Surgical Date 04/13/15   Next MD Visit None                     [facility] Adult PT Treatment/Exercise - 06/23/15 0001    Knee/Hip Exercises: Aerobic   Nustep L6 x16 min, seat 9-8 for ROM   Knee/Hip Exercises: Standing   Hip Abduction AROM;Both;2 sets;10 reps;Knee straight   Forward Step Up Right;2 sets;10 reps;Hand Hold: 2;Step Height: 6"   Knee/Hip Exercises: Seated   Long Arc Quad Strengthening;Right;3 sets;10 reps;Weights   Long Arc Quad Weight 3 lbs.   Modalities   Modalities Facilities manager  Stimulation   Electrical Stimulation Location R knee   Electrical Stimulation Action Pre-Mod   Electrical Stimulation Parameters 80-150 Hz x15 min   Electrical Stimulation Goals Edema;Pain   Vasopneumatic   Number Minutes Vasopneumatic  15 minutes   Vasopnuematic Location  Knee   Vasopneumatic Pressure Medium   Vasopneumatic Temperature  66                  PT Short Term Goals - 06/07/15 2212    PT SHORT TERM GOAL #1   Title Ind wiht a HEP.   Time 2   Period Weeks   Status Achieved   PT SHORT TERM GOAL #2   Title Full active right knee extension.   Time 2   Period Weeks   Status On-going           PT Long Term Goals - 06/07/15 2212    PT LONG TERM GOAL #1   Title Active right knee flexion to 105 degrees+ so the patient can perform functional tasks and do so with pain not > 2-3/10.   Baseline Goal  update to 105 degrees.   Time 4   Period Weeks   Status On-going   PT LONG TERM GOAL #2   Title Increase right knee strength to a solid 4+/5 to provide good stability for accomplishment of functional activities   Time 4   Period Weeks   Status On-going   PT LONG TERM GOAL #3   Title Decrease edema to within 2 cms of non-affected side to assist with pain reduction and range of motion gains.   Time 4   Period Weeks   Status On-going   PT LONG TERM GOAL #4   Title Perform a reciprocating stair gait with one railing with pain not > 2-3/10.   Time 4   Period Weeks   Status On-going   PT LONG TERM GOAL #5   Title Walk in clinic 500 feet with a straight cane without deviation.   Time 4   Period Weeks   Status On-going               Plan - 06/23/15 [id number]    Clinical Impression Statement Patient presented in clinic again with increased R knee edema and stiffness per patient report. Patient continues to report compliance with icing and elevation of R knee at home. Patient tolerated today's exercises well and required VCs for erect stance during step ups and hip abduction. Flexed trunk with forward steps and hip abduction made exercise more difficult for patient and following VCs patient adjusted quickly. Normal modalities response noted following removal of the modaliites. Achieved ambulation goal as patient now ambulates with [facility] majoritty of the time except if she is outside. Patient denied R knee pain following today's treatment.   Pt will benefit from skilled therapeutic intervention in order to improve on the following deficits Pain;Decreased activity tolerance;Decreased range of motion;Decreased strength;Difficulty walking   Rehab Potential Good   PT Frequency 2x / week   PT Duration 6 weeks   PT Treatment/Interventions ADLs/Self Care Home Management;Electrical Stimulation;Cryotherapy;Therapeutic exercise;Therapeutic activities;Gait training;Ultrasound;Balance  training;Neuromuscular re-education;Manual techniques;Vasopneumatic Device;Passive range of motion   PT Next Visit Plan Cont with POC for Right LE ROM, strengthening and gait training without cane as well as balance activities.                   KX MODIFIER   PT Home Exercise Plan HEP- resisted knee extension and flexion with yellow  theraband   Consulted and Agree with Plan of Care Patient        Problem List Patient Active Problem List   Diagnosis Date Noted  . S/P right knee arthroscopy 04/13/2015  . OSA (obstructive sleep apnea) 01/01/2013  . Diabetes ([place]) 10/23/2012  . Aortic valve disorder 06/30/2010  . CVA (cerebral vascular accident) ([place]) 06/30/2010  . S/P aortic valve replacement 06/30/2010  . HYPERLIPIDEMIA 09/29/2008  . OVERWEIGHT 09/29/2008  . HYPERTENSION 09/29/2008  . DEGENERATIVE JOINT DISEASE 09/29/2008    Wynelle Fanny, PTA 06/23/2015, 9:55 AM  [facility] [address] [place], [name], [address] Phone: [phone]   Fax:  [phone]  Name: CINIYA LARDIE MRN: [id number] Date of Birth: 31-Jan-1941

## 2015-06-25 NOTE — Addendum Note (Signed)
Addended by: Ernie Kasler, [place] W on: 06/25/2015 01:35 PM   Modules accepted: Orders

## 2015-06-25 NOTE — Therapy (Signed)
Mary [name], [name], [address] Phone: [phone]   Fax:  [phone]  Physical Therapy Treatment  Patient Details  Name: Mary [name] MRN: [id number] Date of Birth: 1940/07/17 Referring Provider: Jean Rosenthal MD  Encounter Date: 06/23/2015    Past Medical History  Diagnosis Date  . DJD (degenerative joint disease)   . Overweight(278.02)   . Hyperlipidemia   . Hypertension   . Shortness of breath dyspnea     with exertion  . Diabetes mellitus without complication ([place])   . Stroke [facility])     " light stroke"  . Sleep apnea   . Anxiety   . Wears glasses   . Diabetic neuropathy ([place])   . Diabetic neuropathy ([place])   . Neuromuscular disorder ([place])     neuropathy in feet    Past Surgical History  Procedure Laterality Date  . Ankle surgery      Left tendon repair  . Aortic valve replacement  03/1994  . Lumbar fusion    . Cervical spine surgery    . Hemorrhoid surgery    . Cardiac catheterization      [place]  . Colonoscopy    . Knee arthroscopy w/ meniscectomy Right 04/13/2015  . Knee arthroscopy Right 04/13/2015    Procedure: RIGHT KNEE ARTHROSCOPY WITH DEBRIDEMENT AND PARTIAL MEDIAL MENISCECTOMY;  Surgeon: Mcarthur Rossetti, MD;  Location: [facility];  Service: Orthopedics;  Laterality: Right;    There were no vitals filed for this visit.                                 PT Short Term Goals - 06/07/15 2212    PT SHORT TERM GOAL #1   Title Ind wiht a HEP.   Time 2   Period Weeks   Status Achieved   PT SHORT TERM GOAL #2   Title Full active right knee extension.   Time 2   Period Weeks   Status On-going           PT Long Term Goals - 06/25/15 1329    PT LONG TERM GOAL #1   Title Active right knee flexion to 105 degrees+ so the patient can perform functional tasks and do so with pain not > 2-3/10.   Baseline Goal update to 105 degrees.   Time 4   Period Weeks   Status On-going   PT LONG TERM GOAL #2   Title Increase right knee strength to a solid 4+/5 to provide good stability for accomplishment of functional activities   Time 4   Period Weeks   Status On-going   PT LONG TERM GOAL #3   Title Decrease edema to within 2 cms of non-affected side to assist with pain reduction and range of motion gains.   Time 4   Period Weeks   Status On-going   PT LONG TERM GOAL #4   Title Perform a reciprocating stair gait with one railing with pain not > 2-3/10.   Period Weeks   Status On-going   PT LONG TERM GOAL #5   Title Walk in clinic 500 feet with a straight cane without deviation.   Time 4   Period Weeks   Status On-going             Patient will benefit from skilled therapeutic intervention in order to improve the following deficits and impairments:  Pain, Decreased  activity tolerance, Decreased range of motion, Decreased strength, Difficulty walking  Visit Diagnosis: Pain in right knee - Plan: PT plan of care cert/re-cert  Stiffness of right knee, not elsewhere classified - Plan: PT plan of care cert/re-cert     Problem List Patient Active Problem List   Diagnosis Date Noted  . S/P right knee arthroscopy 04/13/2015  . OSA (obstructive sleep apnea) 01/01/2013  . Diabetes ([place]) 10/23/2012  . Aortic valve disorder 06/30/2010  . CVA (cerebral vascular accident) ([place]) 06/30/2010  . S/P aortic valve replacement 06/30/2010  . HYPERLIPIDEMIA 09/29/2008  . OVERWEIGHT 09/29/2008  . HYPERTENSION 09/29/2008  . DEGENERATIVE JOINT DISEASE 09/29/2008    Kaysea Raya, [place] MPT 06/25/2015, 1:35 PM  [facility] [address] [place], [name], [address] Phone: [phone]   Fax:  [phone]  Name: Mary [name] MRN: [id number] Date of Birth: Oct 06, 1940

## 2015-06-28 ENCOUNTER — Ambulatory Visit: Payer: Medicare Other | Admitting: Physical Therapy

## 2015-06-28 DIAGNOSIS — M25661 Stiffness of right knee, not elsewhere classified: Secondary | ICD-10-CM

## 2015-06-28 DIAGNOSIS — M25561 Pain in right knee: Secondary | ICD-10-CM

## 2015-06-28 NOTE — Therapy (Signed)
[facility] [place], [name], [address] Phone: [phone]   Fax:  [phone]  Physical Therapy Treatment  Patient Details  Name: Mary [name] MRN: [id number] Date of Birth: September 03, 1940 Referring Provider: Jean Rosenthal MD  Encounter Date: 06/28/2015      PT End of Session - 06/28/15 0940    Visit Number 22   Number of Visits 30   Date for PT Re-Evaluation 08/24/15   PT Start Time 0945   PT Stop Time 1048   PT Time Calculation (min) 63 min   Activity Tolerance Patient tolerated treatment well   Behavior During Therapy [facility] for tasks assessed/performed      Past Medical History  Diagnosis Date  . DJD (degenerative joint disease)   . Overweight(278.02)   . Hyperlipidemia   . Hypertension   . Shortness of breath dyspnea     with exertion  . Diabetes mellitus without complication ([place])   . Stroke [facility])     " light stroke"  . Sleep apnea   . Anxiety   . Wears glasses   . Diabetic neuropathy ([place])   . Diabetic neuropathy ([place])   . Neuromuscular disorder ([place])     neuropathy in feet    Past Surgical History  Procedure Laterality Date  . Ankle surgery      Left tendon repair  . Aortic valve replacement  03/1994  . Lumbar fusion    . Cervical spine surgery    . Hemorrhoid surgery    . Cardiac catheterization      [place]  . Colonoscopy    . Knee arthroscopy w/ meniscectomy Right 04/13/2015  . Knee arthroscopy Right 04/13/2015    Procedure: RIGHT KNEE ARTHROSCOPY WITH DEBRIDEMENT AND PARTIAL MEDIAL MENISCECTOMY;  Surgeon: Mcarthur Rossetti, MD;  Location: [facility];  Service: Orthopedics;  Laterality: Right;    There were no vitals filed for this visit.      Subjective Assessment - 06/28/15 0941    Subjective Patient reports she is having increased pain today, but doesn't know why.   Limitations Walking   Patient Stated Goals Want to reduce my knee pain and do the things I use to do.   Currently in Pain? Yes   Pain Score 6    Pain Location Knee   Pain Orientation Right   Pain Descriptors / Indicators Sore   Pain Type Surgical pain   Pain Onset More than a month ago   Pain Frequency Constant   Aggravating Factors  walking   Pain Relieving Factors rest            [facility] PT Assessment - 06/28/15 0001    Assessment   Medical Diagnosis Post right knee arthroscopic surgery   Onset Date/Surgical Date 04/13/15   Circumferential Edema   Circumferential - Right 50.5 cm  ankle 25 cm   Circumferential - Left  44.5 cm  ankle 24 cm   ROM / Strength   AROM / PROM / Strength AROM;PROM   AROM   AROM Assessment Site Knee   Right/Left Knee Right   Right Knee Extension -3   Right Knee Flexion 99   PROM   PROM Assessment Site Knee   Right/Left Knee Right   Right Knee Extension 0   Right Knee Flexion 108   Strength   Overall Strength Comments R knee ext 4/5; flex 5-/5  [facility] Adult PT Treatment/Exercise - 06/28/15 0001    Knee/Hip Exercises: Aerobic   Nustep L6 x16 min, seat 9-8 for ROM   Knee/Hip Exercises: Machines for Strengthening   Cybex Knee Extension 1 plate 2x10   Cybex Knee Flexion 3 plates x 20   Cybex Leg Press 1 plate 2x10   Electrical Stimulation   Electrical Stimulation Location R knee   Electrical Stimulation Parameters 1-10 Hz to tolerance x 15 min   Electrical Stimulation Goals Edema   Vasopneumatic   Number Minutes Vasopneumatic  15 minutes   Vasopnuematic Location  Knee   Vasopneumatic Pressure Medium                PT Education - 06/28/15 1434    Education provided Yes   Education Details Discussed compression stockings or using support hose to help with edema.   Person(s) Educated Patient   Methods Explanation   Comprehension Verbalized understanding          PT Short Term Goals - 06/07/15 2212    PT SHORT TERM GOAL #1   Title Ind wiht a HEP.   Time 2   Period Weeks   Status Achieved    PT SHORT TERM GOAL #2   Title Full active right knee extension.   Time 2   Period Weeks   Status On-going           PT Long Term Goals - 06/28/15 1015    PT LONG TERM GOAL #1   Title Active right knee flexion to 105 degrees+ so the patient can perform functional tasks and do so with pain not > 2-3/10.   Baseline Goal update to 105 degrees.   Time 4   Period Weeks   Status On-going   PT LONG TERM GOAL #2   Title Increase right knee strength to a solid 4+/5 to provide good stability for accomplishment of functional activities   Time 4   Period Weeks   Status On-going   PT LONG TERM GOAL #3   Title Decrease edema to within 2 cms of non-affected side to assist with pain reduction and range of motion gains.   Time 4   Period Weeks   Status On-going   PT LONG TERM GOAL #4   Title Perform a reciprocating stair gait with one railing with pain not > 2-3/10.   Time 4   Period Weeks   Status On-going   PT LONG TERM GOAL #5   Title Walk in clinic 500 feet with a straight cane without deviation.   Time 4   Period Weeks   Status On-going               Plan - 06/28/15 1436    Clinical Impression Statement Patient did fairly well with treatment today. She was able to tolerate increased strengthening using machines wihtout c/o increased pain. Her edema was increased today in the knee and ankle and use of support hose or compression stockings was discussed. Full knee extension likely limited by edema.   Rehab Potential Good   PT Frequency 2x / week   PT Duration 6 weeks   PT Treatment/Interventions ADLs/Self Care Home Management;Electrical Stimulation;Cryotherapy;Therapeutic exercise;Therapeutic activities;Gait training;Ultrasound;Balance training;Neuromuscular re-education;Manual techniques;Vasopneumatic Device;Passive range of motion   PT Next Visit Plan Cont with POC for Right LE ROM, strengthening and gait training without cane as well as balance activities.  KX MODIFIER   Consulted and Agree with Plan of Care Patient      Patient will benefit from skilled therapeutic intervention in order to improve the following deficits and impairments:  Pain, Decreased activity tolerance, Decreased range of motion, Decreased strength, Difficulty walking  Visit Diagnosis: Pain in right knee  Stiffness of right knee, not elsewhere classified     Problem List Patient Active Problem List   Diagnosis Date Noted  . S/P right knee arthroscopy 04/13/2015  . OSA (obstructive sleep apnea) 01/01/2013  . Diabetes ([place]) 10/23/2012  . Aortic valve disorder 06/30/2010  . CVA (cerebral vascular accident) ([place]) 06/30/2010  . S/P aortic valve replacement 06/30/2010  . HYPERLIPIDEMIA 09/29/2008  . OVERWEIGHT 09/29/2008  . HYPERTENSION 09/29/2008  . DEGENERATIVE JOINT DISEASE 09/29/2008    Madelyn Flavors PT  06/28/2015, 2:40 PM  [facility] [address] [place], [name], [address] Phone: [phone]   Fax:  [phone]  Name: Mary [name] MRN: [id number] Date of Birth: 1940-07-28

## 2015-07-01 ENCOUNTER — Ambulatory Visit: Payer: Medicare Other | Admitting: Physical Therapy

## 2015-07-01 DIAGNOSIS — M25661 Stiffness of right knee, not elsewhere classified: Secondary | ICD-10-CM

## 2015-07-01 DIAGNOSIS — M25561 Pain in right knee: Secondary | ICD-10-CM

## 2015-07-01 NOTE — Therapy (Signed)
[facility] [place], [name], [address] Phone: [phone]   Fax:  [phone]  Physical Therapy Treatment  Patient Details  Name: Mary [name] MRN: [id number] Date of Birth: 1940/04/07 Referring Provider: Jean Rosenthal MD  Encounter Date: 07/01/2015      PT End of Session - 07/01/15 1334    Visit Number 23   Number of Visits 30   Date for PT Re-Evaluation 08/24/15   PT Start Time 0945   PT Stop Time 1029   PT Time Calculation (min) 44 min   Activity Tolerance Patient tolerated treatment well   Behavior During Therapy [facility] for tasks assessed/performed      Past Medical History  Diagnosis Date  . DJD (degenerative joint disease)   . Overweight(278.02)   . Hyperlipidemia   . Hypertension   . Shortness of breath dyspnea     with exertion  . Diabetes mellitus without complication ([place])   . Stroke [facility])     " light stroke"  . Sleep apnea   . Anxiety   . Wears glasses   . Diabetic neuropathy ([place])   . Diabetic neuropathy ([place])   . Neuromuscular disorder ([place])     neuropathy in feet    Past Surgical History  Procedure Laterality Date  . Ankle surgery      Left tendon repair  . Aortic valve replacement  03/1994  . Lumbar fusion    . Cervical spine surgery    . Hemorrhoid surgery    . Cardiac catheterization      [place]  . Colonoscopy    . Knee arthroscopy w/ meniscectomy Right 04/13/2015  . Knee arthroscopy Right 04/13/2015    Procedure: RIGHT KNEE ARTHROSCOPY WITH DEBRIDEMENT AND PARTIAL MEDIAL MENISCECTOMY;  Surgeon: Mcarthur Rossetti, MD;  Location: [facility];  Service: Orthopedics;  Laterality: Right;    There were no vitals filed for this visit.      Subjective Assessment - 07/01/15 1335    Subjective That last treatment was rough.  Pain increased.   Patient Stated Goals Want to reduce my knee pain and do the things I use to do.   Pain Score 6    Pain Location Knee                                    PT Short Term Goals - 06/07/15 2212    PT SHORT TERM GOAL #1   Title Ind wiht a HEP.   Time 2   Period Weeks   Status Achieved   PT SHORT TERM GOAL #2   Title Full active right knee extension.   Time 2   Period Weeks   Status On-going           PT Long Term Goals - 06/28/15 1015    PT LONG TERM GOAL #1   Title Active right knee flexion to 105 degrees+ so the patient can perform functional tasks and do so with pain not > 2-3/10.   Baseline Goal update to 105 degrees.   Time 4   Period Weeks   Status On-going   PT LONG TERM GOAL #2   Title Increase right knee strength to a solid 4+/5 to provide good stability for accomplishment of functional activities   Time 4   Period Weeks   Status On-going   PT LONG TERM GOAL #3   Title Decrease  edema to within 2 cms of non-affected side to assist with pain reduction and range of motion gains.   Time 4   Period Weeks   Status On-going   PT LONG TERM GOAL #4   Title Perform a reciprocating stair gait with one railing with pain not > 2-3/10.   Time 4   Period Weeks   Status On-going   PT LONG TERM GOAL #5   Title Walk in clinic 500 feet with a straight cane without deviation.   Time 4   Period Weeks   Status On-going             Patient will benefit from skilled therapeutic intervention in order to improve the following deficits and impairments:     Visit Diagnosis: Pain in right knee  Stiffness of right knee, not elsewhere classified     Problem List Patient Active Problem List   Diagnosis Date Noted  . S/P right knee arthroscopy 04/13/2015  . OSA (obstructive sleep apnea) 01/01/2013  . Diabetes ([place]) 10/23/2012  . Aortic valve disorder 06/30/2010  . CVA (cerebral vascular accident) ([place]) 06/30/2010  . S/P aortic valve replacement 06/30/2010  . HYPERLIPIDEMIA 09/29/2008  . OVERWEIGHT 09/29/2008  . HYPERTENSION 09/29/2008  . DEGENERATIVE JOINT  DISEASE 09/29/2008   Treatment:  Nustep level 4 x 23 minutes f/b STW/M x 15 minutes and patellar mobs to patient's right knee in supine.  Patient tolerated tx. well. Ryszard Socarras, [place] MPT 07/01/2015, 1:40 PM  [facility] [address] [place], [name], [address] Phone: [phone]   Fax:  [phone]  Name: Mary [name] MRN: [id number] Date of Birth: 1941/03/15

## 2015-07-06 ENCOUNTER — Encounter: Payer: Self-pay | Admitting: Physical Therapy

## 2015-07-06 ENCOUNTER — Ambulatory Visit: Payer: Medicare Other | Admitting: Physical Therapy

## 2015-07-06 DIAGNOSIS — M25661 Stiffness of right knee, not elsewhere classified: Secondary | ICD-10-CM

## 2015-07-06 DIAGNOSIS — M25561 Pain in right knee: Secondary | ICD-10-CM

## 2015-07-06 NOTE — Therapy (Signed)
[facility] [place], [name], [address] Phone: [phone]   Fax:  [phone]  Physical Therapy Treatment  Patient Details  Name: Mary [name] MRN: [id number] Date of Birth: Feb 21, 1941 Referring Provider: Jean Rosenthal MD  Encounter Date: 07/06/2015      PT End of Session - 07/06/15 1004    Visit Number 24   Number of Visits 30   Date for PT Re-Evaluation 08/24/15   PT Start Time 0948   PT Stop Time 1043   PT Time Calculation (min) 55 min   Activity Tolerance Patient tolerated treatment well   Behavior During Therapy [facility] for tasks assessed/performed      Past Medical History  Diagnosis Date  . DJD (degenerative joint disease)   . Overweight(278.02)   . Hyperlipidemia   . Hypertension   . Shortness of breath dyspnea     with exertion  . Diabetes mellitus without complication ([place])   . Stroke [facility])     " light stroke"  . Sleep apnea   . Anxiety   . Wears glasses   . Diabetic neuropathy ([place])   . Diabetic neuropathy ([place])   . Neuromuscular disorder ([place])     neuropathy in feet    Past Surgical History  Procedure Laterality Date  . Ankle surgery      Left tendon repair  . Aortic valve replacement  03/1994  . Lumbar fusion    . Cervical spine surgery    . Hemorrhoid surgery    . Cardiac catheterization      [place]  . Colonoscopy    . Knee arthroscopy w/ meniscectomy Right 04/13/2015  . Knee arthroscopy Right 04/13/2015    Procedure: RIGHT KNEE ARTHROSCOPY WITH DEBRIDEMENT AND PARTIAL MEDIAL MENISCECTOMY;  Surgeon: Mcarthur Rossetti, MD;  Location: [facility];  Service: Orthopedics;  Laterality: Right;    There were no vitals filed for this visit.      Subjective Assessment - 07/06/15 1003    Subjective States that her knee is still sore and will be glad when it is not. States that she wishes to D/C at this time due to co pay and to start facility's self directed gym program.   Limitations Walking   Patient Stated Goals Want to reduce my knee pain and do the things I use to do.   Currently in Pain? Yes   Pain Score 4    Pain Location Knee   Pain Orientation Right   Pain Descriptors / Indicators Sore   Pain Type Surgical pain   Pain Onset More than a month ago            [facility] PT Assessment - 07/06/15 0001    Assessment   Medical Diagnosis Post right knee arthroscopic surgery   Onset Date/Surgical Date 04/13/15   Next MD Visit None   ROM / Strength   AROM / PROM / Strength AROM;Strength   AROM   Overall AROM  Deficits;Within functional limits for tasks performed   AROM Assessment Site Knee   Right/Left Knee Right   Right Knee Extension -2   Right Knee Flexion 105   Strength   Overall Strength Deficits   Strength Assessment Site Knee;Hip   Right/Left Hip Right   Right Hip Flexion 4+/5   Right Hip ABduction 4+/5   Right/Left Knee Right   Right Knee Flexion 4/5   Right Knee Extension 4/5  [place] Adult PT Treatment/Exercise - 07/06/15 0001    Ambulation/Gait   Stairs Yes   Stairs Assistance 6: Modified independent (Device/Increase time)   Stair Management Technique One rail Right;Step to pattern;Forwards;With cane   Number of Stairs 8   Height of Stairs 6   Knee/Hip Exercises: Aerobic   Nustep L6, seat 11 x18 min   Knee/Hip Exercises: Machines for Strengthening   Cybex Knee Extension 1 plate 2x10   Cybex Knee Flexion 3 plates x 20   Knee/Hip Exercises: Standing   Hip Abduction AROM;Both;2 sets;10 reps;Knee straight   Forward Step Up Right;2 sets;10 reps;Hand Hold: 2;Step Height: 6"   Modalities   Modalities Designer, multimedia Location R knee   Electrical Stimulation Action Pre-Mod   Electrical Stimulation Parameters 80-150 Hz x15 min   Electrical Stimulation Goals Pain;Edema   Vasopneumatic   Number Minutes Vasopneumatic  15 minutes    Vasopnuematic Location  Knee   Vasopneumatic Pressure Medium   Vasopneumatic Temperature  34                  PT Short Term Goals - 07/06/15 1035    PT SHORT TERM GOAL #1   Title Ind wiht a HEP.   Time 2   Period Weeks   Status Achieved   PT SHORT TERM GOAL #2   Title Full active right knee extension.   Time 2   Period Weeks   Status Partially Met  AROM R knee ext 2 deg from neutral 07/06/2015           PT Long Term Goals - 07/06/15 1005    PT LONG TERM GOAL #1   Title Active right knee flexion to 105 degrees+ so the patient can perform functional tasks and do so with pain not > 2-3/10.   Baseline Goal update to 105 degrees.   Time 4   Period Weeks   Status Achieved  AROM R knee flexion 105 deg 07/06/2015   PT LONG TERM GOAL #2   Title Increase right knee strength to a solid 4+/5 to provide good stability for accomplishment of functional activities   Time 4   Period Weeks   Status Partially Met  R knee MMT 4/5 with ext and flex 07/06/2015   PT LONG TERM GOAL #3   Title Decrease edema to within 2 cms of non-affected side to assist with pain reduction and range of motion gains.   Time 4   Period Weeks   Status Not Met  Not met per previous measurements and patient report of continued swelling    PT LONG TERM GOAL #4   Title Perform a reciprocating stair gait with one railing with pain not > 2-3/10.   Time 4   Period Weeks   Status Not Met   PT LONG TERM GOAL #5   Title Walk in clinic 500 feet with a straight cane without deviation.   Time 4   Period Weeks   Status Achieved  Patient only using SPC now except for cases of prolonged ambulated in which she uses a walker 07/06/2015               Plan - 07/06/15 0950    Clinical Impression Statement Patient tolerated today's treatment fairly well as patient remains hesitant and guarded with R knee as she doesn't "want this to happen again." Patient required multimodal cueing for correct exercise  technique and VCs for erect stance as  she fatigues and begins leaning on parallel bars. Patient reported R knee discomfort with machine knee extension with 10#. Patient ambulated stairs with step to pattern as she states she is "careful" as to not reinjure herself. Patient was educated regarding proper postitioning of SPC duirng stair ambulation to meet the cane as she ambulates forward. Patient was also educated regarding facility's gym program and that if she had any questions to ask a staff member. AROM of R knee measured as 2-105 deg in supine. R hip MMT 4+/5 and R knee MMT 4/5 upon assessment. Patient continues to utilize [facility] for ambulation but if she is aware of prolonged ambulation she states she uses her FWW. Normal modalities response noted following removal of the modaliites. Patient experienced medial R knee discomfort following today's treatment.   Rehab Potential Good   PT Frequency 2x / week   PT Duration 6 weeks   PT Treatment/Interventions ADLs/Self Care Home Management;Electrical Stimulation;Cryotherapy;Therapeutic exercise;Therapeutic activities;Gait training;Ultrasound;Balance training;Neuromuscular re-education;Manual techniques;Vasopneumatic Device;Passive range of motion   PT Next Visit Plan D/C summary required by MPT.   PT Home Exercise Plan HEP- resisted knee extension and flexion with yellow theraband   Consulted and Agree with Plan of Care Patient      Patient will benefit from skilled therapeutic intervention in order to improve the following deficits and impairments:  Pain, Decreased activity tolerance, Decreased range of motion, Decreased strength, Difficulty walking  Visit Diagnosis: Pain in right knee  Stiffness of right knee, not elsewhere classified     Problem List Patient Active Problem List   Diagnosis Date Noted  . S/P right knee arthroscopy 04/13/2015  . OSA (obstructive sleep apnea) 01/01/2013  . Diabetes ([place]) 10/23/2012  . Aortic valve disorder  06/30/2010  . CVA (cerebral vascular accident) ([place]) 06/30/2010  . S/P aortic valve replacement 06/30/2010  . HYPERLIPIDEMIA 09/29/2008  . OVERWEIGHT 09/29/2008  . HYPERTENSION 09/29/2008  . DEGENERATIVE JOINT DISEASE 09/29/2008    Ahmed Prima, PTA 07/06/2015 4:04 PM  [facility] [place], [name], [address] Phone: [phone]   Fax:  [phone]  Name: JACKI COUSE MRN: [id number] Date of Birth: 1941-01-16

## 2015-07-06 NOTE — Therapy (Signed)
[facility] [place], [name], [address] Phone: [phone]   Fax:  [phone]  Physical Therapy Treatment  Patient Details  Name: Mary [name] MRN: [id number] Date of Birth: Feb 21, 1941 Referring Provider: Jean Rosenthal MD  Encounter Date: 07/06/2015      PT End of Session - 07/06/15 1004    Visit Number 24   Number of Visits 30   Date for PT Re-Evaluation 08/24/15   PT Start Time 0948   PT Stop Time 1043   PT Time Calculation (min) 55 min   Activity Tolerance Patient tolerated treatment well   Behavior During Therapy [facility] for tasks assessed/performed      Past Medical History  Diagnosis Date  . DJD (degenerative joint disease)   . Overweight(278.02)   . Hyperlipidemia   . Hypertension   . Shortness of breath dyspnea     with exertion  . Diabetes mellitus without complication ([place])   . Stroke [facility])     " light stroke"  . Sleep apnea   . Anxiety   . Wears glasses   . Diabetic neuropathy ([place])   . Diabetic neuropathy ([place])   . Neuromuscular disorder ([place])     neuropathy in feet    Past Surgical History  Procedure Laterality Date  . Ankle surgery      Left tendon repair  . Aortic valve replacement  03/1994  . Lumbar fusion    . Cervical spine surgery    . Hemorrhoid surgery    . Cardiac catheterization      [place]  . Colonoscopy    . Knee arthroscopy w/ meniscectomy Right 04/13/2015  . Knee arthroscopy Right 04/13/2015    Procedure: RIGHT KNEE ARTHROSCOPY WITH DEBRIDEMENT AND PARTIAL MEDIAL MENISCECTOMY;  Surgeon: Mcarthur Rossetti, MD;  Location[facility];  Service: Orthopedics;  Laterality: Right;    There were no vitals filed for this visit.      Subjective Assessment - 07/06/15 1003    Subjective States that her knee is still sore and will be glad when it is not. States that she wishes to D/C at this time due to co pay and to start facility's self directed gym program.   Limitations Walking   Patient Stated Goals Want to reduce my knee pain and do the things I use to do.   Currently in Pain? Yes   Pain Score 4    Pain Location Knee   Pain Orientation Right   Pain Descriptors / Indicators Sore   Pain Type Surgical pain   Pain Onset More than a month ago            [facility] PT Assessment - 07/06/15 0001    Assessment   Medical Diagnosis Post right knee arthroscopic surgery   Onset Date/Surgical Date 04/13/15   Next MD Visit None   ROM / Strength   AROM / PROM / Strength AROM;Strength   AROM   Overall AROM  Deficits;Within functional limits for tasks performed   AROM Assessment Site Knee   Right/Left Knee Right   Right Knee Extension -2   Right Knee Flexion 105   Strength   Overall Strength Deficits   Strength Assessment Site Knee;Hip   Right/Left Hip Right   Right Hip Flexion 4+/5   Right Hip ABduction 4+/5   Right/Left Knee Right   Right Knee Flexion 4/5   Right Knee Extension 4/5  [facility] PT Treatment/Exercise - 07/06/15 0001    Ambulation/Gait   Stairs Yes   Stairs Assistance 6: Modified independent (Device/Increase time)   Stair Management Technique One rail Right;Step to pattern;Forwards;With cane   Number of Stairs 8   Height of Stairs 6   Knee/Hip Exercises: Aerobic   Nustep L6, seat 11 x18 min   Knee/Hip Exercises: Machines for Strengthening   Cybex Knee Extension 1 plate 2x10   Cybex Knee Flexion 3 plates x 20   Knee/Hip Exercises: Standing   Hip Abduction AROM;Both;2 sets;10 reps;Knee straight   Forward Step Up Right;2 sets;10 reps;Hand Hold: 2;Step Height: 6"   Modalities   Modalities Designer, multimedia Location R knee   Electrical Stimulation Action Pre-Mod   Electrical Stimulation Parameters 80-150 Hz x15 min   Electrical Stimulation Goals Pain;Edema   Vasopneumatic   Number Minutes Vasopneumatic  15 minutes    Vasopnuematic Location  Knee   Vasopneumatic Pressure Medium   Vasopneumatic Temperature  34                  PT Short Term Goals - 07/06/15 1035    PT SHORT TERM GOAL #1   Title Ind wiht a HEP.   Time 2   Period Weeks   Status Achieved   PT SHORT TERM GOAL #2   Title Full active right knee extension.   Time 2   Period Weeks   Status Partially Met  AROM R knee ext 2 deg from neutral 07/06/2015           PT Long Term Goals - 07/06/15 1005    PT LONG TERM GOAL #1   Title Active right knee flexion to 105 degrees+ so the patient can perform functional tasks and do so with pain not > 2-3/10.   Baseline Goal update to 105 degrees.   Time 4   Period Weeks   Status Achieved  AROM R knee flexion 105 deg 07/06/2015   PT LONG TERM GOAL #2   Title Increase right knee strength to a solid 4+/5 to provide good stability for accomplishment of functional activities   Time 4   Period Weeks   Status Partially Met  R knee MMT 4/5 with ext and flex 07/06/2015   PT LONG TERM GOAL #3   Title Decrease edema to within 2 cms of non-affected side to assist with pain reduction and range of motion gains.   Time 4   Period Weeks   Status Not Met  Not met per previous measurements and patient report of continued swelling    PT LONG TERM GOAL #4   Title Perform a reciprocating stair gait with one railing with pain not > 2-3/10.   Time 4   Period Weeks   Status Not Met   PT LONG TERM GOAL #5   Title Walk in clinic 500 feet with a straight cane without deviation.   Time 4   Period Weeks   Status Achieved  Patient only using SPC now except for cases of prolonged ambulated in which she uses a walker 07/06/2015               Plan - 07/06/15 0950    Clinical Impression Statement Patient tolerated today's treatment fairly well as patient remains hesitant and guarded with R knee as she doesn't "want this to happen again." Patient required multimodal cueing for correct exercise  technique and VCs for erect stance as  she fatigues and begins leaning on parallel bars. Patient reported R knee discomfort with machine knee extension with 10#. Patient ambulated stairs with step to pattern as she states she is "careful" as to not reinjure herself. Patient was educated regarding proper postitioning of SPC duirng stair ambulation to meet the cane as she ambulates forward. Patient was also educated regarding facility's gym program and that if she had any questions to ask a staff member. AROM of R knee measured as 2-105 deg in supine. R hip MMT 4+/5 and R knee MMT 4/5 upon assessment. Patient continues to utilize [facility] for ambulation but if she is aware of prolonged ambulation she states she uses her FWW. Normal modalities response noted following removal of the modaliites. Patient experienced medial R knee discomfort following today's treatment.   Rehab Potential Good   PT Frequency 2x / week   PT Duration 6 weeks   PT Treatment/Interventions ADLs/Self Care Home Management;Electrical Stimulation;Cryotherapy;Therapeutic exercise;Therapeutic activities;Gait training;Ultrasound;Balance training;Neuromuscular re-education;Manual techniques;Vasopneumatic Device;Passive range of motion   PT Next Visit Plan D/C summary required by MPT.   PT Home Exercise Plan HEP- resisted knee extension and flexion with yellow theraband   Consulted and Agree with Plan of Care Patient      Patient will benefit from skilled therapeutic intervention in order to improve the following deficits and impairments:  Pain, Decreased activity tolerance, Decreased range of motion, Decreased strength, Difficulty walking  Visit Diagnosis: Pain in right knee  Stiffness of right knee, not elsewhere classified       G-Codes - 2015-07-15 1730    Functional Assessment Tool Used Foto.   Functional Limitation Mobility: Walking and moving around   Mobility: Walking and Moving Around Current Status [phone]) At least 40 percent  but less than 60 percent impaired, limited or restricted   Mobility: Walking and Moving Around Goal Status [phone]) At least 20 percent but less than 40 percent impaired, limited or restricted   Mobility: Walking and Moving Around Discharge Status [phone]) At least 20 percent but less than 40 percent impaired, limited or restricted      Problem List Patient Active Problem List   Diagnosis Date Noted  . S/P right knee arthroscopy 04/13/2015  . OSA (obstructive sleep apnea) 01/01/2013  . Diabetes ([place]) 10/23/2012  . Aortic valve disorder 06/30/2010  . CVA (cerebral vascular accident) ([place]) 06/30/2010  . S/P aortic valve replacement 06/30/2010  . HYPERLIPIDEMIA 09/29/2008  . OVERWEIGHT 09/29/2008  . HYPERTENSION 09/29/2008  . DEGENERATIVE JOINT DISEASE 09/29/2008   PHYSICAL THERAPY DISCHARGE SUMMARY  Visits from Start of Care: 24  Current functional level related to goals / functional outcomes: Please see above.   Remaining deficits: Continued right knee weakness, swelling and unable to perform a reciprocating stair gait.     Education / Equipment: HEP.  Plan: Patient agrees to discharge.  Patient goals were partially met. Patient is being discharged due to financial reasons.  ?????      Cortina Vultaggio, [place] MPT 15-Jul-2015, 5:31 PM  [facility] [address] [place], [name], [address] Phone: [phone]   Fax:  [phone]  Name: VICTORINE MCNEE MRN: [id number] Date of Birth: 1941/01/04

## 2015-07-08 ENCOUNTER — Encounter: Payer: Medicare Other | Admitting: Physical Therapy

## 2015-07-12 ENCOUNTER — Ambulatory Visit (INDEPENDENT_AMBULATORY_CARE_PROVIDER_SITE_OTHER): Payer: Medicare Other | Admitting: *Deleted

## 2015-07-12 DIAGNOSIS — Z952 Presence of prosthetic heart valve: Secondary | ICD-10-CM

## 2015-07-12 DIAGNOSIS — I359 Nonrheumatic aortic valve disorder, unspecified: Secondary | ICD-10-CM | POA: Diagnosis not present

## 2015-07-12 DIAGNOSIS — Z954 Presence of other heart-valve replacement: Secondary | ICD-10-CM | POA: Diagnosis not present

## 2015-07-12 DIAGNOSIS — I639 Cerebral infarction, unspecified: Secondary | ICD-10-CM | POA: Diagnosis not present

## 2015-07-12 LAB — POCT INR: INR: 3.6

## 2015-08-09 ENCOUNTER — Encounter: Payer: Self-pay | Admitting: *Deleted

## 2015-08-25 ENCOUNTER — Ambulatory Visit (INDEPENDENT_AMBULATORY_CARE_PROVIDER_SITE_OTHER): Payer: Medicare Other | Admitting: *Deleted

## 2015-08-25 DIAGNOSIS — Z954 Presence of other heart-valve replacement: Secondary | ICD-10-CM

## 2015-08-25 DIAGNOSIS — I359 Nonrheumatic aortic valve disorder, unspecified: Secondary | ICD-10-CM | POA: Diagnosis not present

## 2015-08-25 DIAGNOSIS — Z952 Presence of prosthetic heart valve: Secondary | ICD-10-CM

## 2015-08-25 DIAGNOSIS — I639 Cerebral infarction, unspecified: Secondary | ICD-10-CM

## 2015-08-25 LAB — POCT INR: INR: 5.3

## 2015-09-02 ENCOUNTER — Encounter: Payer: Medicare Other | Admitting: Adult Health

## 2015-09-02 NOTE — Progress Notes (Signed)
Cardiology Office Note   ERROR NO SHOW

## 2015-09-03 ENCOUNTER — Encounter: Payer: Self-pay | Admitting: Adult Health

## 2015-09-07 ENCOUNTER — Other Ambulatory Visit (HOSPITAL_COMMUNITY): Payer: Self-pay | Admitting: Family Medicine

## 2015-09-07 ENCOUNTER — Encounter (INDEPENDENT_AMBULATORY_CARE_PROVIDER_SITE_OTHER): Payer: Self-pay | Admitting: Internal Medicine

## 2015-09-07 ENCOUNTER — Ambulatory Visit (HOSPITAL_COMMUNITY)
Admission: RE | Admit: 2015-09-07 | Discharge: 2015-09-07 | Disposition: A | Discharge status: Home / Self Care | Payer: Medicare Other | Source: Ambulatory Visit | Attending: Family Medicine | Admitting: Family Medicine

## 2015-09-07 DIAGNOSIS — R0602 Shortness of breath: Secondary | ICD-10-CM | POA: Diagnosis not present

## 2015-09-07 DIAGNOSIS — J90 Pleural effusion, not elsewhere classified: Secondary | ICD-10-CM | POA: Diagnosis not present

## 2015-09-07 DIAGNOSIS — I517 Cardiomegaly: Secondary | ICD-10-CM | POA: Diagnosis not present

## 2015-09-08 ENCOUNTER — Ambulatory Visit (INDEPENDENT_AMBULATORY_CARE_PROVIDER_SITE_OTHER): Payer: Medicare Other | Admitting: *Deleted

## 2015-09-08 ENCOUNTER — Other Ambulatory Visit (HOSPITAL_COMMUNITY): Payer: Self-pay | Admitting: Family Medicine

## 2015-09-08 DIAGNOSIS — Z954 Presence of other heart-valve replacement: Secondary | ICD-10-CM | POA: Diagnosis not present

## 2015-09-08 DIAGNOSIS — I359 Nonrheumatic aortic valve disorder, unspecified: Secondary | ICD-10-CM

## 2015-09-08 DIAGNOSIS — I639 Cerebral infarction, unspecified: Secondary | ICD-10-CM | POA: Diagnosis not present

## 2015-09-08 DIAGNOSIS — I517 Cardiomegaly: Secondary | ICD-10-CM

## 2015-09-08 DIAGNOSIS — Z952 Presence of prosthetic heart valve: Secondary | ICD-10-CM

## 2015-09-08 LAB — POCT INR: INR: 4.4

## 2015-09-08 MED ORDER — WARFARIN SODIUM 2 MG PO TABS: ORAL_TABLET | ORAL | Status: DC

## 2015-09-13 ENCOUNTER — Ambulatory Visit (HOSPITAL_COMMUNITY): Payer: Medicare Other

## 2015-09-14 ENCOUNTER — Other Ambulatory Visit (HOSPITAL_COMMUNITY): Payer: Self-pay | Admitting: Family Medicine

## 2015-09-14 DIAGNOSIS — R1011 Right upper quadrant pain: Secondary | ICD-10-CM

## 2015-09-15 ENCOUNTER — Telehealth: Payer: Self-pay | Admitting: Cardiology

## 2015-09-15 ENCOUNTER — Ambulatory Visit (HOSPITAL_COMMUNITY)
Admission: RE | Admit: 2015-09-15 | Discharge: 2015-09-15 | Disposition: A | Discharge status: Home / Self Care | Payer: Medicare Other | Source: Ambulatory Visit | Attending: Family Medicine | Admitting: Family Medicine

## 2015-09-15 ENCOUNTER — Other Ambulatory Visit (HOSPITAL_COMMUNITY): Payer: Self-pay | Admitting: Family Medicine

## 2015-09-15 DIAGNOSIS — I2510 Atherosclerotic heart disease of native coronary artery without angina pectoris: Secondary | ICD-10-CM | POA: Diagnosis not present

## 2015-09-15 DIAGNOSIS — K8020 Calculus of gallbladder without cholecystitis without obstruction: Secondary | ICD-10-CM | POA: Insufficient documentation

## 2015-09-15 DIAGNOSIS — Z952 Presence of prosthetic heart valve: Secondary | ICD-10-CM | POA: Insufficient documentation

## 2015-09-15 DIAGNOSIS — R1011 Right upper quadrant pain: Secondary | ICD-10-CM

## 2015-09-15 DIAGNOSIS — I517 Cardiomegaly: Secondary | ICD-10-CM

## 2015-09-15 DIAGNOSIS — K7689 Other specified diseases of liver: Secondary | ICD-10-CM | POA: Insufficient documentation

## 2015-09-15 DIAGNOSIS — I712 Thoracic aortic aneurysm, without rupture: Secondary | ICD-10-CM | POA: Insufficient documentation

## 2015-09-15 DIAGNOSIS — J90 Pleural effusion, not elsewhere classified: Secondary | ICD-10-CM | POA: Diagnosis not present

## 2015-09-15 NOTE — Telephone Encounter (Signed)
Patient discussed with Dr Karie Kirks, it appears her last clinical visit was 09/2014 with NP Purcell Nails, does not look like she has established with an MD. Found to have bilateral pleural effusions on recent chest CT, Dr Karie Kirks is worried if could be related to CHF, she has had some significant fatigue as well. We will arrange an appointment this week with either extender or Dr Harrington Challenger on Friday for clinical evaluation. She may also be considered for gallbladder surgery pending further testing and will need cardiac risk stratification for that as well   [name] MD

## 2015-09-16 ENCOUNTER — Encounter (HOSPITAL_COMMUNITY): Payer: Self-pay

## 2015-09-16 ENCOUNTER — Ambulatory Visit (INDEPENDENT_AMBULATORY_CARE_PROVIDER_SITE_OTHER): Payer: Medicare Other | Admitting: Internal Medicine

## 2015-09-16 ENCOUNTER — Encounter (HOSPITAL_COMMUNITY)
Admission: RE | Admit: 2015-09-16 | Discharge: 2015-09-16 | Disposition: A | Discharge status: Home / Self Care | Payer: Medicare Other | Source: Ambulatory Visit | Attending: Family Medicine | Admitting: Family Medicine

## 2015-09-16 DIAGNOSIS — K8020 Calculus of gallbladder without cholecystitis without obstruction: Secondary | ICD-10-CM | POA: Diagnosis present

## 2015-09-16 MED ORDER — SINCALIDE 5 MCG IJ SOLR
INTRAMUSCULAR | Status: AC
  Administered 2015-09-16: 1.64 ug via INTRAVENOUS
  Filled 2015-09-16: qty 5

## 2015-09-16 MED ORDER — TECHNETIUM TC 99M MEBROFENIN IV KIT
5.0000 | PACK | Freq: Once | INTRAVENOUS | Status: AC | PRN
  Administered 2015-09-16: 5.4 via INTRAVENOUS

## 2015-09-16 MED ORDER — STERILE WATER FOR INJECTION IJ SOLN
INTRAMUSCULAR | Status: AC
  Administered 2015-09-16: 1.64 mL via INTRAVENOUS
  Filled 2015-09-16: qty 10

## 2015-09-16 MED ORDER — SODIUM CHLORIDE 0.9% FLUSH
INTRAVENOUS | Status: AC
  Filled 2015-09-16: qty 40

## 2015-09-22 ENCOUNTER — Ambulatory Visit (INDEPENDENT_AMBULATORY_CARE_PROVIDER_SITE_OTHER): Payer: Medicare Other | Admitting: *Deleted

## 2015-09-22 DIAGNOSIS — I359 Nonrheumatic aortic valve disorder, unspecified: Secondary | ICD-10-CM

## 2015-09-22 DIAGNOSIS — Z954 Presence of other heart-valve replacement: Secondary | ICD-10-CM | POA: Diagnosis not present

## 2015-09-22 DIAGNOSIS — Z952 Presence of prosthetic heart valve: Secondary | ICD-10-CM

## 2015-09-22 DIAGNOSIS — I639 Cerebral infarction, unspecified: Secondary | ICD-10-CM | POA: Diagnosis not present

## 2015-09-22 LAB — POCT INR: INR: 2.7

## 2015-10-05 ENCOUNTER — Other Ambulatory Visit (HOSPITAL_COMMUNITY): Payer: Self-pay | Admitting: Family Medicine

## 2015-10-05 DIAGNOSIS — R634 Abnormal weight loss: Secondary | ICD-10-CM

## 2015-10-13 ENCOUNTER — Ambulatory Visit (HOSPITAL_COMMUNITY)
Admission: RE | Admit: 2015-10-13 | Discharge: 2015-10-13 | Disposition: A | Discharge status: Home / Self Care | Payer: Medicare Other | Source: Ambulatory Visit | Attending: Family Medicine | Admitting: Family Medicine

## 2015-10-13 DIAGNOSIS — I712 Thoracic aortic aneurysm, without rupture: Secondary | ICD-10-CM | POA: Insufficient documentation

## 2015-10-13 DIAGNOSIS — R634 Abnormal weight loss: Secondary | ICD-10-CM | POA: Diagnosis present

## 2015-10-13 DIAGNOSIS — J90 Pleural effusion, not elsewhere classified: Secondary | ICD-10-CM | POA: Insufficient documentation

## 2015-10-13 DIAGNOSIS — K7460 Unspecified cirrhosis of liver: Secondary | ICD-10-CM | POA: Diagnosis not present

## 2015-10-13 DIAGNOSIS — K8020 Calculus of gallbladder without cholecystitis without obstruction: Secondary | ICD-10-CM | POA: Diagnosis not present

## 2015-10-13 DIAGNOSIS — I517 Cardiomegaly: Secondary | ICD-10-CM | POA: Diagnosis not present

## 2015-10-13 DIAGNOSIS — I700 Atherosclerosis of aorta: Secondary | ICD-10-CM | POA: Insufficient documentation

## 2015-10-13 LAB — POCT I-STAT CREATININE: CREATININE: 0.8 mg/dL (ref 0.44–1.00)

## 2015-10-13 MED ORDER — IOPAMIDOL (ISOVUE-300) INJECTION 61%
100.0000 mL | Freq: Once | INTRAVENOUS | Status: AC | PRN
  Administered 2015-10-13: 100 mL via INTRAVENOUS

## 2015-10-15 ENCOUNTER — Other Ambulatory Visit (HOSPITAL_COMMUNITY): Payer: Self-pay | Admitting: Family Medicine

## 2015-10-15 DIAGNOSIS — Z1231 Encounter for screening mammogram for malignant neoplasm of breast: Secondary | ICD-10-CM

## 2015-10-20 ENCOUNTER — Ambulatory Visit: Payer: Medicare Other | Admitting: Cardiology

## 2015-10-21 ENCOUNTER — Ambulatory Visit (HOSPITAL_COMMUNITY)
Admission: RE | Admit: 2015-10-21 | Discharge: 2015-10-21 | Disposition: A | Discharge status: Home / Self Care | Payer: Medicare Other | Source: Ambulatory Visit | Attending: Family Medicine | Admitting: Family Medicine

## 2015-10-21 DIAGNOSIS — Z1231 Encounter for screening mammogram for malignant neoplasm of breast: Secondary | ICD-10-CM | POA: Diagnosis not present

## 2015-10-22 ENCOUNTER — Encounter: Payer: Self-pay | Admitting: Physician Assistant

## 2015-10-22 NOTE — Progress Notes (Signed)
Cardiology Office Note    Date:  10/25/2015  ID:  [name], [name] 22-May-1940, MRN [id number] PCP:  Robert Bellow, MD  Cardiologist: Previously Dr. Lattie Haw.  Chief Complaint:   History of Present Illness:  Mary [name] is a 75 y.o. female with history of St. Jaortic valve replacement 1996, HTN, HLD, OSA, CVA, diet-controlled DM who presents for routine follow-up of valve disease and HTN. She is a former patient of Dr. Lattie Haw and has not yet been back to see a reassigned cardiologist. Do not see any echocardiograms in the chart for review. Last Hgb 03/2015 was 12.6 with Plt 120, Cr 0.79.  She returns for follow-up today. She has had a rough year. Her husband was run over by his own truck in 2022/08/18 and nearly died. Since that time she has been progressively losing weight unintentionally. She says she used to weigh 220lb but is now down to 159 without trying. She reports her doctor has done multiple labs, CXR, abd CT, and has a planned UGI/LGI study to further evaluate. She reports she has occasional chest pain, sometimes once a day, can last minutes to up to 1 hour. It happens when she is doing activity or when she is emotionally stressed. She does not have it every time she exerts herself, however. It improves with rest. No associated SOB, diaphoresis, nausea or syncope. No family history of CAD. She denies any dx of CAD at time of AVR. No palpitations. No bleeding issues on Coumadin.   Past Medical History:  Diagnosis Date  . Anxiety   . Diabetes mellitus with neuropathy ([place])   . Diabetic neuropathy ([place])   . DJD (degenerative joint disease)   . H/O aortic valve replacement   . Hyperlipidemia   . Hypertension   . Neuromuscular disorder ([facility])    neuropathy in feet  . Obesity   . Sleep apnea   . Stroke [facility])    " light stroke"  . Wears glasses     Past Surgical History:  Procedure Laterality Date  . ANKLE SURGERY     Left tendon repair  . AORTIC VALVE REPLACEMENT  03/1994  .  CARDIAC CATHETERIZATION     1996 [facility]  . CERVICAL SPINE SURGERY    . COLONOSCOPY    . HEMORRHOID SURGERY    . KNEE ARTHROSCOPY Right 04/13/2015   Procedure: RIGHT KNEE ARTHROSCOPY WITH DEBRIDEMENT AND PARTIAL MEDIAL MENISCECTOMY;  Surgeon: Mcarthur Rossetti, MD;  Location: [facility];  Service: Orthopedics;  Laterality: Right;  . KNEE ARTHROSCOPY W/ MENISCECTOMY Right 04/13/2015  . LUMBAR FUSION      Current Medications: Current Outpatient Prescriptions  Medication Sig Dispense Refill  . acetaminophen (TYLENOL) 500 MG tablet Take 500-1,000 mg by mouth daily as needed for moderate pain.    Marland Kitchen ALPRAZolam (XANAX) 0.5 MG tablet Take 0.5 mg by mouth 4 (four) times daily as needed for anxiety or sleep (every night for sleep).     . bisacodyl (DULCOLAX) 5 MG EC tablet Take 5 mg by mouth daily as needed for moderate constipation.    . cetirizine (ZYRTEC) 10 MG tablet Take 10 mg by mouth daily as needed for allergies.   11  . [employer] MELATONIN 3 MG TABS Take 3 mg by mouth at bedtime.  11  . ergocalciferol (VITAMIN D2) 50000 units capsule Take 50,000 Units by mouth every Friday.    . fluticasone (FLONASE) 50 MCG/ACT nasal spray Place 2 sprays into both nostrils daily.  11  . furosemide (LASIX) 40 MG tablet Take 40 mg by mouth daily as needed for fluid.    Marland Kitchen gabapentin (NEURONTIN) 100 MG capsule Take 100 mg by mouth daily.  11  . lisinopril (PRINIVIL,ZESTRIL) 10 MG tablet TAKE 1 TABLET BY MOUTH EVERY DAY FOR HIGH BP AND HEART  3  . metoprolol (TOPROL-XL) 50 MG 24 hr tablet Take 50 mg by mouth daily.     Marland Kitchen omeprazole (PRILOSEC) 40 MG capsule Take 40 mg by mouth daily.  0  . oxyCODONE (OXY IR/ROXICODONE) 5 MG immediate release tablet Take 1-2 tablets (5-10 mg total) by mouth every 3 (three) hours as needed for breakthrough pain. 60 tablet 0  . simvastatin (ZOCOR) 40 MG tablet Take 40 mg by mouth daily at 6 PM.     . warfarin (COUMADIN) 2 MG tablet Take 1 tablet daily except 1/2 tablet on Thursdays or  as directed 90 tablet 3   No current facility-administered medications for this visit.      Allergies:   Tape   Social History   Social History  . Marital status: Married    Spouse name: N/A  . Number of children: N/A  . Years of education: N/A   Occupational History  . Disabled Retired   Social History Main Topics  . Smoking status: Never Smoker  . Smokeless tobacco: Never Used  . Alcohol use No  . Drug use: No  . Sexual activity: Not Asked   Other Topics Concern  . None   Social History Narrative   Widowed   No regular exercise   2 children     Family History:  The patient's family history includes Dementia in her mother; Pneumonia in her sister.   ROS:   Please see the history of present illness.  All other systems are reviewed and otherwise negative.    PHYSICAL EXAM:   VS:  BP 130/62   Pulse 90   Ht 5\' 6"  (1.676 m)   Wt 159 lb (72.1 kg)   SpO2 98%   BMI 25.66 kg/m   BMI: Body mass index is 25.66 kg/m. GEN: Well nourished, well developed WF in no acute distress  HEENT: normocephalic, atraumatic Neck: no JVD, carotid bruits, or masses Cardiac: RRR; crisp valve click, no murmurs, rubs, or gallops, no edema  Respiratory:  clear to auscultation bilaterally, normal work of breathing GI: soft, nontender, nondistended, + BS MS: no deformity or atrophy  Skin: warm and dry, no rash Neuro:  Alert and Oriented x 3, Strength and sensation are intact, follows commands Psych: euthymic mood, full affect  Wt Readings from Last 3 Encounters:  10/25/15 159 lb (72.1 kg)  04/13/15 205 lb 3.2 oz (93.1 kg)  04/02/15 205 lb 3.2 oz (93.1 kg)      Studies/Labs Reviewed:   EKG:  EKG was ordered today and personally reviewed by me - reviewed with DR. 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   Recent Labs: 04/02/2015: BUN 9; Potassium 4.4; Sodium 142 04/14/2015: Hemoglobin  12.6; Platelets 120 10/13/2015: Creatinine, Ser 0.80   Lipid Panel No results found for: CHOL, TRIG, HDL, CHOLHDL, VLDL, LDLCALC, LDLDIRECT  Additional studies/ records that were reviewed today include: Summarized above.    ASSESSMENT & PLAN:   1. Aortic valve disorder s/p mechanical AVR - no issues with bleeding on Coumadin. Will see if we can get a copy of her most recent  labs from PCP. See below re: chest pain. F/u echo. Continue dental ppx. 2. Chest pain - typical/atypical features. Will see if we can get a copy of most recent labwork from PCP. We discussed various options for testing including stress testing. At this time she wants to limit the amount of studies being performed and does not wish to proceed. She is due for an update on her echocardiogram and is amenable to this. If echo shows decreased LVEF will need to consider cath. If echo is normal, valve looks OK and symptoms persist, would recommend to proceed with nuclear stress test at that time. She would need Lexiscan due to h/o knee issues. 3. Essential HTN - controlled. 4. Hyperlipidemia - monitored by PCP. 5. Obesity with recent progressive weight loss - certainly concerning given the brisk nature of the weight loss. Under investigation by primary care. 6. Abnormal rhythm - per review with Dr. 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 He does not feel this represents flutter at this time. She is not having any symptoms. She is already on anticoagulation. Will continue to monitor.  Disposition: F/u with Dr. Harl Bowie to establish care in 6 months. Needs to be an MD visit to be able to establish with a cardiologist in the practice since hers retired several years ago.   Medication Adjustments/Labs and Tests Ordered: Current medicines are reviewed at length with the patient today.  Concerns regarding medicines are outlined above. Medication changes, Labs and Tests ordered today are summarized above and  listed in the Patient Instructions accessible in Encounters.   Raechel Ache PA-C  10/25/2015 2:33 PM    [facility] Location [facility] [place], [place] [address] Ph: [phone]; Fax [phone]

## 2015-10-25 ENCOUNTER — Ambulatory Visit (INDEPENDENT_AMBULATORY_CARE_PROVIDER_SITE_OTHER): Payer: Medicare Other | Admitting: *Deleted

## 2015-10-25 ENCOUNTER — Ambulatory Visit (INDEPENDENT_AMBULATORY_CARE_PROVIDER_SITE_OTHER): Payer: Medicare Other | Admitting: Physician Assistant

## 2015-10-25 ENCOUNTER — Encounter: Payer: Self-pay | Admitting: Physician Assistant

## 2015-10-25 VITALS — BP 130/62 | HR 90 | Ht 66.0 in | Wt 159.0 lb

## 2015-10-25 DIAGNOSIS — Z952 Presence of prosthetic heart valve: Secondary | ICD-10-CM

## 2015-10-25 DIAGNOSIS — I10 Essential (primary) hypertension: Secondary | ICD-10-CM | POA: Diagnosis not present

## 2015-10-25 DIAGNOSIS — I639 Cerebral infarction, unspecified: Secondary | ICD-10-CM | POA: Diagnosis not present

## 2015-10-25 DIAGNOSIS — I498 Other specified cardiac arrhythmias: Secondary | ICD-10-CM

## 2015-10-25 DIAGNOSIS — I359 Nonrheumatic aortic valve disorder, unspecified: Secondary | ICD-10-CM | POA: Diagnosis not present

## 2015-10-25 DIAGNOSIS — R634 Abnormal weight loss: Secondary | ICD-10-CM

## 2015-10-25 DIAGNOSIS — Z954 Presence of other heart-valve replacement: Secondary | ICD-10-CM | POA: Diagnosis not present

## 2015-10-25 DIAGNOSIS — R079 Chest pain, unspecified: Secondary | ICD-10-CM | POA: Diagnosis not present

## 2015-10-25 DIAGNOSIS — E785 Hyperlipidemia, unspecified: Secondary | ICD-10-CM | POA: Diagnosis not present

## 2015-10-25 DIAGNOSIS — I499 Cardiac arrhythmia, unspecified: Secondary | ICD-10-CM

## 2015-10-25 LAB — POCT INR: INR: 1.2

## 2015-10-25 NOTE — Patient Instructions (Signed)
Medication Instructions:  Your physician recommends that you continue on your current medications as directed. Please refer to the Current Medication list given to you today.   Labwork: I will request a copy of your recent lab work from PCP  Testing/Procedures: Your physician has requested that you have an echocardiogram. Echocardiography is a painless test that uses sound waves to create images of your heart. It provides your doctor with information about the size and shape of your heart and how well your heart's chambers and valves are working. This procedure takes approximately one hour. There are no restrictions for this procedure.    Follow-Up: Your physician wants you to follow-up in: 6 MONTHS .  You will receive a reminder letter in the mail two months in advance. If you don't receive a letter, please call our office to schedule the follow-up appointment.   Any Other Special Instructions Will Be Listed Below (If Applicable).     If you need a refill on your cardiac medications before your next appointment, please call your pharmacy.

## 2015-10-26 ENCOUNTER — Telehealth: Payer: Self-pay

## 2015-10-26 NOTE — Telephone Encounter (Signed)
Pt called to schedule her colonoscopy, she received a letter from DS. Please call [phone]

## 2015-11-01 ENCOUNTER — Ambulatory Visit (INDEPENDENT_AMBULATORY_CARE_PROVIDER_SITE_OTHER): Payer: Medicare Other | Admitting: *Deleted

## 2015-11-01 DIAGNOSIS — I639 Cerebral infarction, unspecified: Secondary | ICD-10-CM

## 2015-11-01 DIAGNOSIS — Z954 Presence of other heart-valve replacement: Secondary | ICD-10-CM

## 2015-11-01 DIAGNOSIS — I359 Nonrheumatic aortic valve disorder, unspecified: Secondary | ICD-10-CM | POA: Diagnosis not present

## 2015-11-01 DIAGNOSIS — Z952 Presence of prosthetic heart valve: Secondary | ICD-10-CM

## 2015-11-01 LAB — POCT INR: INR: 1.6

## 2015-11-03 ENCOUNTER — Ambulatory Visit (HOSPITAL_COMMUNITY)
Admission: RE | Admit: 2015-11-03 | Discharge: 2015-11-03 | Disposition: A | Discharge status: Home / Self Care | Payer: Medicare Other | Source: Ambulatory Visit | Attending: Physician Assistant | Admitting: Physician Assistant

## 2015-11-03 DIAGNOSIS — Z952 Presence of prosthetic heart valve: Secondary | ICD-10-CM | POA: Diagnosis not present

## 2015-11-03 DIAGNOSIS — I351 Nonrheumatic aortic (valve) insufficiency: Secondary | ICD-10-CM | POA: Insufficient documentation

## 2015-11-03 DIAGNOSIS — I071 Rheumatic tricuspid insufficiency: Secondary | ICD-10-CM | POA: Insufficient documentation

## 2015-11-03 DIAGNOSIS — I340 Nonrheumatic mitral (valve) insufficiency: Secondary | ICD-10-CM | POA: Diagnosis not present

## 2015-11-03 DIAGNOSIS — G4733 Obstructive sleep apnea (adult) (pediatric): Secondary | ICD-10-CM | POA: Diagnosis not present

## 2015-11-03 DIAGNOSIS — R079 Chest pain, unspecified: Secondary | ICD-10-CM | POA: Diagnosis not present

## 2015-11-03 DIAGNOSIS — E785 Hyperlipidemia, unspecified: Secondary | ICD-10-CM | POA: Diagnosis not present

## 2015-11-03 DIAGNOSIS — I119 Hypertensive heart disease without heart failure: Secondary | ICD-10-CM | POA: Diagnosis not present

## 2015-11-03 DIAGNOSIS — I359 Nonrheumatic aortic valve disorder, unspecified: Secondary | ICD-10-CM | POA: Diagnosis not present

## 2015-11-03 DIAGNOSIS — Z8673 Personal history of transient ischemic attack (TIA), and cerebral infarction without residual deficits: Secondary | ICD-10-CM | POA: Diagnosis not present

## 2015-11-03 LAB — ECHOCARDIOGRAM COMPLETE
AV Area VTI: 1.9 cm2
AV Mean grad: 6 mmHg
AV Peak grad: 12 mmHg
AV area mean vel ind: 1.06 cm2/m2
AVAREAMEANV: 1.96 cm2
AVAREAVTIIND: 1.06 cm2/m2
AVCELMEANRAT: 0.57
AVLVOTPG: 4 mmHg
AVPKVEL: 171 cm/s
Ao pk vel: 0.55 m/s
CHL CUP AV PEAK INDEX: 1.03
CHL CUP AV VEL: 1.96
CHL CUP MV DEC (S): 324
CHL CUP STROKE VOLUME: 35 mL
DOP CAL AO MEAN VELOCITY: 115 cm/s
EWDT: 324 ms
FS: 40 % (ref 28–44)
IVS/LV PW RATIO, ED: 0.69
LA ID, A-P, ES: 32 mm
LA diam end sys: 32 mm
LA diam index: 1.73 cm/m2
LA vol A4C: 62 ml
LA vol: 71.7 mL
LAVOLIN: 38.9 mL/m2
LDCA: 3.46 cm2
LV PW d: 13 mm — AB (ref 0.6–1.1)
LV SIMPSON'S DISK: 56
LV dias vol index: 34 mL/m2
LV dias vol: 62 mL (ref 46–106)
LV sys vol index: 15 mL/m2
LVOT SV: 76 mL
LVOT VTI: 22.1 cm
LVOT peak VTI: 0.57 cm
LVOTD: 21 mm
LVOTPV: 93.8 cm/s
LVSYSVOL: 27 mL (ref 14–42)
MVPG: 7 mmHg
MVPKEVEL: 130 m/s
RV LATERAL S' VELOCITY: 6.3 cm/s
RV sys press: 33 mmHg
Reg peak vel: 274 cm/s
TAPSE: 11.4 mm
TRMAXVEL: 274 cm/s
VTI: 39.1 cm
Valve area index: 1.06
Valve area: 1.96 cm2

## 2015-11-03 NOTE — Progress Notes (Signed)
*  PRELIMINARY RESULTS* Echocardiogram 2D Echocardiogram has been performed.  Samuel Germany 11/03/2015, 10:13 AM

## 2015-11-04 ENCOUNTER — Telehealth: Payer: Self-pay

## 2015-11-04 NOTE — Telephone Encounter (Signed)
Pt called office,hung up before I could get her results,called back and got answering machine-cc

## 2015-11-04 NOTE — Telephone Encounter (Signed)
Mary [name]  ECHO COMPLETE WO IMAGE ENHANCING AGENT  Order# [id number]  Reading physician: Arnoldo Lenis, MD Ordering physician: Charlie Pitter, PA-C Study date: 11/03/15  Result Notes   Notes Recorded by Jeanann Lewandowsky, RMA on 11/03/2015 at 2:59 PM EDT Lmptcb jw 11/03/15 ------  Notes Recorded by Charlie Pitter, PA-C on 11/03/2015 at 2:50 PM EDT Please see result note below this - just wanted to add to please remind [name] know that if she is having any dental procedures she will need antibiotic prophylaxis ahead of time so to let our office know. Dayna Dunn PA-C  ------  Notes Recorded by Charlie Pitter, PA-C on 11/03/2015 at 2:49 PM EDT Please let patient know 2D echo showed strong heart, but thickened heart which can occur with history of HTN. Some of the chambers of her heart appear chronically dilated. There was no significant issue with her valve replacement. There is mild MR and mod TR that her cardiologist will follow over time. Important to keep BP controlled. When I saw her, she was reporting chest pain wtih typical/atypical features - she wanted to limit the amount of studies performed so we started with an echo. If her symptoms persist, would arrange Lexiscan nuc. Otherwise she can keep an eye on how she feels and contact us for further sx. F/u 6 mo as previously recommended. Dayna Dunn PA-C

## 2015-11-05 NOTE — Telephone Encounter (Signed)
Attempted to reach pt again yesterday afternoon,only got vm-cc   0843 hr: LMTCB-cc

## 2015-11-05 NOTE — Telephone Encounter (Signed)
I spoke with pt,results of echo given, no more c/o CP,states she knows to cal [place] and I will forward copy to pcp

## 2015-11-10 NOTE — Telephone Encounter (Signed)
Pt is not having any problems. Thinks her last one was over 10 years ago.  I will check and see when it was. She is on coumadin and will need OV before scheduling.

## 2015-11-15 NOTE — Telephone Encounter (Signed)
Pt's last colonoscopy was 12/28/2006 and Dr. Arnoldo Morale recommended her next in 10 years. ( her name then was Mary [name]).  She said she is not having any GI issues and no family hx of colon cancer, but she has had some weight loss.  I recommended her to come in for an OV and discuss, she is also on coumadin.   She said she has so many medical bills now and she hates to create more.  She lost some of the weight because Dr. Karie Kirks advised her to because of her diabetes and then she lost some additional weight.  She declined to schedule appt now and said she will discuss with Dr. Karie Kirks again and let us know.   I am also sending a note to Dr. Karie Kirks.

## 2015-11-16 NOTE — Telephone Encounter (Signed)
Noted and agree. 

## 2015-11-24 ENCOUNTER — Encounter: Payer: Self-pay | Admitting: *Deleted

## 2015-12-06 ENCOUNTER — Ambulatory Visit (INDEPENDENT_AMBULATORY_CARE_PROVIDER_SITE_OTHER): Payer: Medicare Other | Admitting: *Deleted

## 2015-12-06 DIAGNOSIS — Z954 Presence of other heart-valve replacement: Secondary | ICD-10-CM | POA: Diagnosis not present

## 2015-12-06 DIAGNOSIS — I639 Cerebral infarction, unspecified: Secondary | ICD-10-CM

## 2015-12-06 DIAGNOSIS — Z952 Presence of prosthetic heart valve: Secondary | ICD-10-CM

## 2015-12-06 DIAGNOSIS — I359 Nonrheumatic aortic valve disorder, unspecified: Secondary | ICD-10-CM | POA: Diagnosis not present

## 2015-12-06 LAB — POCT INR: INR: 1.6

## 2015-12-22 ENCOUNTER — Ambulatory Visit (INDEPENDENT_AMBULATORY_CARE_PROVIDER_SITE_OTHER): Payer: Medicare Other | Admitting: *Deleted

## 2015-12-22 DIAGNOSIS — Z952 Presence of prosthetic heart valve: Secondary | ICD-10-CM | POA: Diagnosis not present

## 2015-12-22 DIAGNOSIS — I639 Cerebral infarction, unspecified: Secondary | ICD-10-CM | POA: Diagnosis not present

## 2015-12-22 DIAGNOSIS — I359 Nonrheumatic aortic valve disorder, unspecified: Secondary | ICD-10-CM | POA: Diagnosis not present

## 2015-12-22 LAB — POCT INR: INR: 2

## 2015-12-23 ENCOUNTER — Encounter (INDEPENDENT_AMBULATORY_CARE_PROVIDER_SITE_OTHER): Payer: Self-pay | Admitting: Internal Medicine

## 2016-01-12 ENCOUNTER — Ambulatory Visit (INDEPENDENT_AMBULATORY_CARE_PROVIDER_SITE_OTHER): Payer: Medicare Other | Admitting: *Deleted

## 2016-01-12 DIAGNOSIS — I359 Nonrheumatic aortic valve disorder, unspecified: Secondary | ICD-10-CM | POA: Diagnosis not present

## 2016-01-12 DIAGNOSIS — Z952 Presence of prosthetic heart valve: Secondary | ICD-10-CM | POA: Diagnosis not present

## 2016-01-12 LAB — POCT INR: INR: 3.5

## 2016-01-25 ENCOUNTER — Other Ambulatory Visit: Payer: Self-pay | Admitting: *Deleted

## 2016-01-25 MED ORDER — WARFARIN SODIUM 2 MG PO TABS: ORAL_TABLET | ORAL | 3 refills | Status: DC

## 2016-02-02 ENCOUNTER — Ambulatory Visit (INDEPENDENT_AMBULATORY_CARE_PROVIDER_SITE_OTHER): Payer: Medicare Other | Admitting: *Deleted

## 2016-02-02 DIAGNOSIS — I359 Nonrheumatic aortic valve disorder, unspecified: Secondary | ICD-10-CM

## 2016-02-02 DIAGNOSIS — Z952 Presence of prosthetic heart valve: Secondary | ICD-10-CM

## 2016-02-02 LAB — POCT INR: INR: 5.5

## 2016-02-21 ENCOUNTER — Ambulatory Visit (INDEPENDENT_AMBULATORY_CARE_PROVIDER_SITE_OTHER): Payer: Medicare Other | Admitting: *Deleted

## 2016-02-21 DIAGNOSIS — I359 Nonrheumatic aortic valve disorder, unspecified: Secondary | ICD-10-CM

## 2016-02-21 DIAGNOSIS — Z952 Presence of prosthetic heart valve: Secondary | ICD-10-CM | POA: Diagnosis not present

## 2016-02-21 LAB — POCT INR: INR: 3.2

## 2016-03-16 ENCOUNTER — Ambulatory Visit (INDEPENDENT_AMBULATORY_CARE_PROVIDER_SITE_OTHER): Payer: Medicare Other | Admitting: *Deleted

## 2016-03-16 DIAGNOSIS — Z952 Presence of prosthetic heart valve: Secondary | ICD-10-CM

## 2016-03-16 DIAGNOSIS — I359 Nonrheumatic aortic valve disorder, unspecified: Secondary | ICD-10-CM | POA: Diagnosis not present

## 2016-03-16 DIAGNOSIS — I6302 Cerebral infarction due to thrombosis of basilar artery: Secondary | ICD-10-CM | POA: Diagnosis not present

## 2016-03-16 LAB — POCT INR: INR: 6.9

## 2016-03-21 ENCOUNTER — Ambulatory Visit (INDEPENDENT_AMBULATORY_CARE_PROVIDER_SITE_OTHER): Payer: Medicare Other | Admitting: Internal Medicine

## 2016-03-21 ENCOUNTER — Encounter (INDEPENDENT_AMBULATORY_CARE_PROVIDER_SITE_OTHER): Payer: Self-pay | Admitting: *Deleted

## 2016-03-21 ENCOUNTER — Encounter (INDEPENDENT_AMBULATORY_CARE_PROVIDER_SITE_OTHER): Payer: Self-pay | Admitting: Internal Medicine

## 2016-03-21 VITALS — BP 130/80 | HR 66 | Temp 98.0°F | Resp 18 | Ht 66.0 in | Wt 183.8 lb

## 2016-03-21 DIAGNOSIS — J90 Pleural effusion, not elsewhere classified: Secondary | ICD-10-CM

## 2016-03-21 DIAGNOSIS — K7460 Unspecified cirrhosis of liver: Secondary | ICD-10-CM

## 2016-03-21 DIAGNOSIS — K219 Gastro-esophageal reflux disease without esophagitis: Secondary | ICD-10-CM

## 2016-03-21 DIAGNOSIS — R05 Cough: Secondary | ICD-10-CM

## 2016-03-21 DIAGNOSIS — R059 Cough, unspecified: Secondary | ICD-10-CM

## 2016-03-21 MED ORDER — PANTOPRAZOLE SODIUM 40 MG PO TBEC: 40.0000 mg | DELAYED_RELEASE_TABLET | Freq: Every day | ORAL | 5 refills | Status: AC

## 2016-03-21 NOTE — Progress Notes (Signed)
Reason for consultation;  New diagnosis of cirrhosis.  History of present illness:  Patient is 76 year old Caucasian female who is here for evaluation of newly diagnosed cirrhosis. She is accompanied by her husband Kela Millin. Patient states she had appointment few months back because of weight loss but then she figured out why she was losing weight and canceled her appointment. She had abdominopelvic CT with contrast on 10/13/2015 for 50 pound weight loss occurring over 6 months. This study reveals small to moderate right-sided pleural effusion liver contour suggestive of cirrhosis and small cyst in left hepatic lobe cholelithiasis and aortic atherosclerosis. Patient states she used to weigh 220 pounds. She lost down to 168 pounds and since then she has gained 15 pounds back. He feels weight loss was secondary to her husband's illness when he had multiple injuries while working on his truck. She has noted drop in her appetite. She denies nausea vomiting or dysphagia. She complains of intermittent cough. She's had this for more than 2 months. Cough occurs in the morning and evening and usually in prolonged spells. She occasionally has clear sputum. She also complains of dry mouth. She is having intermittent heartburn. She states omeprazole dose was increased from 20-40 mg daily by Dr. Karie Kirks. She began to have more burping. She therefore stopped this medication She denies dysphagia. She also denies abdominal pain. There is no history of jaundice blood transfusion or tattoos. There is also no history of elevated transaminases in the past. She has chronic constipation and takes Dulcolax tablet daily as needed. She also complains of right knee pain. She is wearing a brace. She states she had arthroscopy one year ago but it has not helped.  Family history is negative for chronic liver disease.   Current Medications: Outpatient Encounter Prescriptions as of 03/21/2016  Medication Sig  . acetaminophen  (TYLENOL) 500 MG tablet Take 500-1,000 mg by mouth daily as needed for moderate pain.  Marland Kitchen ALPRAZolam (XANAX) 0.5 MG tablet Take 0.5 mg by mouth 4 (four) times daily as needed for anxiety or sleep (every night for sleep).   . bisacodyl (DULCOLAX) 5 MG EC tablet Take 5 mg by mouth daily as needed for moderate constipation.  . cetirizine (ZYRTEC) 10 MG tablet Take 10 mg by mouth daily as needed for allergies.   . [facility] MELATONIN 3 MG TABS Take 3 mg by mouth at bedtime.  . ergocalciferol (VITAMIN D2) 50000 units capsule Take 50,000 Units by mouth every Friday.  . fluticasone (FLONASE) 50 MCG/ACT nasal spray Place 2 sprays into both nostrils daily.   . furosemide (LASIX) 40 MG tablet Take 40 mg by mouth daily as needed for fluid.  Marland Kitchen gabapentin (NEURONTIN) 100 MG capsule Take 100 mg by mouth daily.  Marland Kitchen HYDROcodone-acetaminophen (NORCO) 10-325 MG tablet Take 1 tablet by mouth as needed.   Marland Kitchen lisinopril (PRINIVIL,ZESTRIL) 10 MG tablet TAKE 1 TABLET BY MOUTH EVERY DAY FOR HIGH BP AND HEART  . metoprolol (TOPROL-XL) 50 MG 24 hr tablet Take 50 mg by mouth daily.   . simvastatin (ZOCOR) 40 MG tablet Take 40 mg by mouth daily at 6 PM.   . warfarin (COUMADIN) 2 MG tablet Take 1 1/2 tablets daily except 2 tablets on Mondays, Wednesdays and Fridays  . omeprazole (PRILOSEC) 40 MG capsule Take 40 mg by mouth daily.  . [DISCONTINUED] oxyCODONE (OXY IR/ROXICODONE) 5 MG immediate release tablet Take 1-2 tablets (5-10 mg total) by mouth every 3 (three) hours as needed for breakthrough pain. (Patient not taking:  Reported on 03/21/2016)   No facility-administered encounter medications on file as of 03/21/2016.    Past medical history:  History of vitamin D deficiency. Asymptomatic cholelithiasis. Diagnosed several years ago. Chronic GERD. Hyperlipidemia. Diabetes mellitus was diagnosed 2 years ago. She had aortic valve replacement in 1995. She has St. Jude's valve. She has been anticoagulated since  then. Hypertension. Chronic constipation. Sleep apnea. Anxiety. Hemorrhoidectomy 1980. On a global back pain secondary to arthritis. 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 She had right knee arthroscopy in January 2017.   Allergies:  Allergies  Allergen Reactions  . Tape Itching    Redness, Please use "paper" tape only    Family history:  Father died of accident at age 44. Mother lived to be 47. She has dementia. 2 sisters are disease. One died of pneumonia in her 37s and other one died of unknown cancer in her 52s. 3 sisters are living. One sister is diabetic. And other sister has CHF. She is in her 23s. Third sister age 36 is in good health.   Social history:  Physical examination: Blood pressure 130/80, pulse 66, temperature 98 F (36.7 C), temperature source Oral, resp. rate 18, height 5\' 6"  (1.676 m), weight 183 lb 12.8 oz (83.4 kg). Patient is alert and in no acute distress. Asterixis absent. Conjunctiva is pink. Sclera is nonicteric Oropharyngeal mucosa is dry otherwise normal. No neck masses or thyromegaly noted. Cardiac exam with regular rhythm normal S1 and snapping S2. She has soft systolic ejection murmur best heard at left upper sternal border. Lungs are clear to auscultation. Abdomen is full but soft and nontender. Spleen is not palpable. Left lobe of the liver is easily palpable. His firm and nontender. Shifting dullness is absent. Trace edema noted around ankles.  Labs/studies Results:  CT results as above. No recent lab studies available for review     Assessment:  #1. 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 She appears to have valve compensated hepatic function. She does have mild thrombocytopenia. #2. Chronic GERD. She will be switched to pantoprazole as she may be having side effects with higher dose of omeprazole. Represent also may interact with  warfarin. #3. History of right-sided pleural effusion. Remains to be seen if effusion has completely resolved. I do not believe effusion secondary to chronic liver disease she does not have ascites on prior imaging studies. #4. Cough. She has predominantly dry cough. This could be secondary to GERD pleural effusion if it is returned or allergies.   Plan:  She will be evaluated with ultrasound with elastography. Requests copy of recent blood work from Dr. Vickey Sages office before further studies ordered. Chest x-ray PA and left lateral view. Discontinue omeprazole. Pantoprazole 40 mg by mouth every morning. Office visit in 2 months.

## 2016-03-21 NOTE — Patient Instructions (Signed)
Physician will call with results of ultrasound and chest x-ray film. Will review blood work by Dr. Karie Kirks before further tests ordered.

## 2016-03-27 ENCOUNTER — Ambulatory Visit (HOSPITAL_COMMUNITY)
Admission: RE | Admit: 2016-03-27 | Discharge: 2016-03-27 | Disposition: A | Discharge status: Home / Self Care | Payer: Medicare Other | Source: Ambulatory Visit | Attending: Internal Medicine | Admitting: Internal Medicine

## 2016-03-27 DIAGNOSIS — J90 Pleural effusion, not elsewhere classified: Secondary | ICD-10-CM | POA: Insufficient documentation

## 2016-03-27 DIAGNOSIS — N2889 Other specified disorders of kidney and ureter: Secondary | ICD-10-CM | POA: Insufficient documentation

## 2016-03-27 DIAGNOSIS — K8020 Calculus of gallbladder without cholecystitis without obstruction: Secondary | ICD-10-CM | POA: Diagnosis not present

## 2016-03-27 DIAGNOSIS — K7460 Unspecified cirrhosis of liver: Secondary | ICD-10-CM | POA: Diagnosis not present

## 2016-03-28 ENCOUNTER — Ambulatory Visit (INDEPENDENT_AMBULATORY_CARE_PROVIDER_SITE_OTHER): Payer: Medicare Other | Admitting: *Deleted

## 2016-03-28 DIAGNOSIS — I6302 Cerebral infarction due to thrombosis of basilar artery: Secondary | ICD-10-CM

## 2016-03-28 DIAGNOSIS — Z952 Presence of prosthetic heart valve: Secondary | ICD-10-CM | POA: Diagnosis not present

## 2016-03-28 DIAGNOSIS — I359 Nonrheumatic aortic valve disorder, unspecified: Secondary | ICD-10-CM | POA: Diagnosis not present

## 2016-03-28 LAB — POCT INR: INR: 2.9

## 2016-04-14 ENCOUNTER — Ambulatory Visit (HOSPITAL_COMMUNITY)
Admission: RE | Admit: 2016-04-14 | Discharge: 2016-04-14 | Disposition: A | Discharge status: Home / Self Care | Payer: Medicare Other | Source: Ambulatory Visit | Attending: Internal Medicine | Admitting: Internal Medicine

## 2016-04-14 DIAGNOSIS — Q2546 Tortuous aortic arch: Secondary | ICD-10-CM | POA: Diagnosis not present

## 2016-04-14 DIAGNOSIS — J90 Pleural effusion, not elsewhere classified: Secondary | ICD-10-CM | POA: Insufficient documentation

## 2016-04-14 DIAGNOSIS — I700 Atherosclerosis of aorta: Secondary | ICD-10-CM | POA: Diagnosis not present

## 2016-04-25 ENCOUNTER — Ambulatory Visit (INDEPENDENT_AMBULATORY_CARE_PROVIDER_SITE_OTHER): Payer: Medicare Other | Admitting: *Deleted

## 2016-04-25 DIAGNOSIS — I359 Nonrheumatic aortic valve disorder, unspecified: Secondary | ICD-10-CM | POA: Diagnosis not present

## 2016-04-25 DIAGNOSIS — Z952 Presence of prosthetic heart valve: Secondary | ICD-10-CM | POA: Diagnosis not present

## 2016-04-25 LAB — POCT INR: INR: 2.5

## 2016-05-04 ENCOUNTER — Encounter: Payer: Self-pay | Admitting: Cardiology

## 2016-05-04 ENCOUNTER — Ambulatory Visit (INDEPENDENT_AMBULATORY_CARE_PROVIDER_SITE_OTHER): Payer: Medicare Other | Admitting: Cardiology

## 2016-05-04 VITALS — BP 118/64 | HR 60 | Ht 66.0 in | Wt 173.0 lb

## 2016-05-04 DIAGNOSIS — Z952 Presence of prosthetic heart valve: Secondary | ICD-10-CM

## 2016-05-04 DIAGNOSIS — R0789 Other chest pain: Secondary | ICD-10-CM

## 2016-05-04 DIAGNOSIS — G4730 Sleep apnea, unspecified: Secondary | ICD-10-CM | POA: Diagnosis not present

## 2016-05-04 DIAGNOSIS — E782 Mixed hyperlipidemia: Secondary | ICD-10-CM

## 2016-05-04 DIAGNOSIS — I359 Nonrheumatic aortic valve disorder, unspecified: Secondary | ICD-10-CM

## 2016-05-04 DIAGNOSIS — I10 Essential (primary) hypertension: Secondary | ICD-10-CM

## 2016-05-04 NOTE — Progress Notes (Signed)
Clinical Summary Mary [name] is a 76 y.o.female seen today for follow up of the following medical problems.   1. Aortic valve replacmenet - St Jude AVF in 1995/1996 - 10/2015 echo LVEF 55-60%, cannot eval diastolic function, normal AVR with mild AI - coumadin followed by our coumadin clinic - no bleeding troubles.   2. HTN - compliant with meds  3. Hyperlipidemia - recent labs by pcp - she has been on simvastatin  4. OSA - noncompliant with cpap  5. History of CVA - followed by pcp  6. DMII - diet controlled  7. Weight loss - workup up per pcp  8. Chest pain - reports occasional chest pain. Aching pain midchest, 3-4/10. Tends to come on with activity. Can have some SOB. Not positional. Lasts few minutes. Only occurs with activity, can have some easy fatigue. Ongoing for several years. No change in severity.   9. Cirrhosis - followed by GI, thought due to NASH   Past Medical History:  Diagnosis Date  . Anxiety   . Diabetes mellitus with neuropathy ([place])   . Diabetic neuropathy ([place])   . DJD (degenerative joint disease)   . H/O aortic valve replacement   . Hyperlipidemia   . Hypertension   . Neuromuscular disorder ([facility])    neuropathy in feet  . Obesity   . Sleep apnea   . Stroke [facility])    " light stroke"  . Wears glasses      Allergies  Allergen Reactions  . Tape Itching    Redness, Please use "paper" tape only     Current Outpatient Prescriptions  Medication Sig Dispense Refill  . acetaminophen (TYLENOL) 500 MG tablet Take 500-1,000 mg by mouth daily as needed for moderate pain.    Marland Kitchen ALPRAZolam (XANAX) 0.5 MG tablet Take 0.5 mg by mouth 4 (four) times daily as needed for anxiety or sleep (every night for sleep).     . bisacodyl (DULCOLAX) 5 MG EC tablet Take 5 mg by mouth daily as needed for moderate constipation.    . cetirizine (ZYRTEC) 10 MG tablet Take 10 mg by mouth daily as needed for allergies.   11  . [facility]S MELATONIN 3 MG TABS Take 3 mg by  mouth at bedtime.  11  . ergocalciferol (VITAMIN D2) 50000 units capsule Take 50,000 Units by mouth every Friday.    . fluticasone (FLONASE) 50 MCG/ACT nasal spray Place 2 sprays into both nostrils daily.   11  . furosemide (LASIX) 40 MG tablet Take 40 mg by mouth daily as needed for fluid.    Marland Kitchen gabapentin (NEURONTIN) 100 MG capsule Take 100 mg by mouth daily.  11  . HYDROcodone-acetaminophen (NORCO) 10-325 MG tablet Take 1 tablet by mouth as needed.   0  . lisinopril (PRINIVIL,ZESTRIL) 10 MG tablet TAKE 1 TABLET BY MOUTH EVERY DAY FOR HIGH BP AND HEART  3  . metoprolol (TOPROL-XL) 50 MG 24 hr tablet Take 50 mg by mouth daily.     . pantoprazole (PROTONIX) 40 MG tablet Take 1 tablet (40 mg total) by mouth daily before breakfast. 30 tablet 5  . simvastatin (ZOCOR) 40 MG tablet Take 40 mg by mouth daily at 6 PM.     . warfarin (COUMADIN) 2 MG tablet Take 1 1/2 tablets daily except 2 tablets on Mondays, Wednesdays and Fridays 150 tablet 3   No current facility-administered medications for this visit.      Past Surgical History:  Procedure Laterality Date  .  ANKLE SURGERY     Left tendon repair  . AORTIC VALVE REPLACEMENT  03/1994  . CARDIAC CATHETERIZATION     1996 [facility]  . CERVICAL SPINE SURGERY    . COLONOSCOPY    . HEMORRHOID SURGERY    . KNEE ARTHROSCOPY Right 04/13/2015   Procedure: RIGHT KNEE ARTHROSCOPY WITH DEBRIDEMENT AND PARTIAL MEDIAL MENISCECTOMY;  Surgeon: Mcarthur Rossetti, MD;  Location: [facility];  Service: Orthopedics;  Laterality: Right;  . KNEE ARTHROSCOPY W/ MENISCECTOMY Right 04/13/2015  . LUMBAR FUSION       Allergies  Allergen Reactions  . Tape Itching    Redness, Please use "paper" tape only      Family History  Problem Relation Age of Onset  . Pneumonia Sister   . Dementia Mother      Social History Ms. Teat reports that she has never smoked. She has never used smokeless tobacco. Ms. Sultan reports that she does not drink alcohol.   Review  of Systems CONSTITUTIONAL: No weight loss, fever, chills, weakness or fatigue.  HEENT: Eyes: No visual loss, blurred vision, double vision or yellow sclerae.No hearing loss, sneezing, congestion, runny nose or sore throat.  SKIN: No rash or itching.  CARDIOVASCULAR: per HPI RESPIRATORY: No shortness of breath, cough or sputum.  GASTROINTESTINAL: No anorexia, nausea, vomiting or diarrhea. No abdominal pain or blood.  GENITOURINARY: No burning on urination, no polyuria NEUROLOGICAL: No headache, dizziness, syncope, paralysis, ataxia, numbness or tingling in the extremities. No change in bowel or bladder control.  MUSCULOSKELETAL: No muscle, back pain, joint pain or stiffness.  LYMPHATICS: No enlarged nodes. No history of splenectomy.  PSYCHIATRIC: No history of depression or anxiety.  ENDOCRINOLOGIC: No reports of sweating, cold or heat intolerance. No polyuria or polydipsia.  Marland Kitchen   Physical Examination Vitals:   05/04/16 1330  BP: 118/64  Pulse: 60   Vitals:   05/04/16 1330  Weight: 173 lb (78.5 kg)  Height: 5\' 6"  (1.676 m)    Gen: resting comfortably, no acute distress HEENT: no scleral icterus, pupils equal round and reactive, no palptable cervical adenopathy,  CV: RRR, no m/r/g, no jvd Resp: Clear to auscultation bilaterally GI: abdomen is soft, non-tender, non-distended, normal bowel sounds, no hepatosplenomegaly MSK: extremities are warm, no edema.  Skin: warm, no rash Neuro:  no focal deficits Psych: appropriate affect  Assessment and Plan   1.Aortic valve replacement - metal valve placed in 1995/1996, normal function by last echo 10/2015 - on coumadin. By guidelines she should also be on ASA 81mg  daily with metal valve, we will start  2. HTN - at goal, continue current meds  3. Hyperlipidemia - request labs from pcp - continue current statin  4. OSA - will refer to sleep medicine  5. Chest pain - multiple CAD risk factors. She has preivously refused stress  testing. We discussed again today, she is to call us back if she decides she wants to have done. If so would plan for lexiscan       Arnoldo Lenis, M.D.

## 2016-05-04 NOTE — Patient Instructions (Addendum)
Your physician wants you to follow-up in: [place] DR. BRANCH You will receive a reminder letter in the mail two months in advance. If you don't receive a letter, please call our office to schedule the follow-up appointment.  Your physician has recommended you make the following change in your medication:   START ASPIRIN 81 MG DAILY  You have been referred to DR [facility]   PLEASE CALL us NEXT WEEK REGARDING YOUR DECISION ON A STRESS TEST   Thank you for choosing [facility]!!

## 2016-05-11 ENCOUNTER — Other Ambulatory Visit: Payer: Self-pay

## 2016-05-11 ENCOUNTER — Telehealth: Payer: Self-pay | Admitting: Cardiology

## 2016-05-11 MED ORDER — FUROSEMIDE 40 MG PO TABS: 40.0000 mg | ORAL_TABLET | Freq: Every day | ORAL | 0 refills | Status: DC | PRN

## 2016-05-11 NOTE — Telephone Encounter (Signed)
Sent in refill on lasix for 1 month/no refills.

## 2016-05-11 NOTE — Telephone Encounter (Signed)
° °  1. Which medications need to be refilled? (please list name of each medication and dose if known) .  furosemide (LASIX) 40 MG tablet [[id number]]    2. Which pharmacy/location (including street and city if local pharmacy) is medication to be sent to? [facility] in [place]  3. Do they need a 30 day or 90 day supply?   90 day  Dr. Karie Kirks normally fills this medication for the pt, but there office is closed and she's supposed to take one everyday and she's out today.

## 2016-05-17 ENCOUNTER — Telehealth: Payer: Self-pay | Admitting: *Deleted

## 2016-05-17 NOTE — Telephone Encounter (Signed)
Patient's husband is requesting return phone call. / tg

## 2016-05-17 NOTE — Telephone Encounter (Signed)
Spoke to pt.'s husband. He has an issue with an appointment. Zaheer Wageman Reid,R.N. Will return his call.

## 2016-05-17 NOTE — Telephone Encounter (Signed)
Spoke with pt's spouse.  Pt scheduled for thoracentesis 05/24/16.  Will hold coumadin 5 days and bridge with Lovenox.  They will come by the [name] office Thursday or Friday to pick up lovenox bridge instructions.  She has done this many times and feels comfortable with it.

## 2016-05-18 ENCOUNTER — Telehealth: Payer: Self-pay | Admitting: *Deleted

## 2016-05-18 ENCOUNTER — Other Ambulatory Visit: Payer: Self-pay | Admitting: *Deleted

## 2016-05-18 ENCOUNTER — Ambulatory Visit (INDEPENDENT_AMBULATORY_CARE_PROVIDER_SITE_OTHER): Payer: Medicare Other | Admitting: *Deleted

## 2016-05-18 DIAGNOSIS — Z952 Presence of prosthetic heart valve: Secondary | ICD-10-CM

## 2016-05-18 DIAGNOSIS — I359 Nonrheumatic aortic valve disorder, unspecified: Secondary | ICD-10-CM | POA: Diagnosis not present

## 2016-05-18 LAB — POCT INR: INR: 2.4

## 2016-05-18 MED ORDER — ENOXAPARIN SODIUM 80 MG/0.8ML ~~LOC~~ SOLN: 80.0000 mg | Freq: Two times a day (BID) | SUBCUTANEOUS | 1 refills | Status: DC

## 2016-05-18 NOTE — Telephone Encounter (Addendum)
Scheduled for thoracentesis on 05/24/16.  Will need to hold coumadin 5 days before and be bridged with lovenox due to AVR.  Wt 78.5kg   SrCr  0.80   CrCl  71.82 Lovenox 80mg  twice daily at 8am & 8pm  3/1  INR 2.4  Take last dose of coumadin 3/2  No lovenox or coumadin 3/3  Lovenox 80mg  at 8am & 8pm 3/4  Lovenox 80mg  at 8am & 8pm 3/5  Lovenox 80mg  at 8am & 8pm 3/6  Lovenox 80mg  at 8am------No lovenox pm 3/7  No Lovenox this am----procedure----coumadin 4mg  pm 3/8  Lovenox 80mg  at 8am & 8pm and coumadin 4mg  pm 3/9  Lovenox 80mg  at 8am & 8pm and coumadin 4mg  pm 3/10  Lovenox 80mg  at 8am & 8pm and coumadin 3mg  pm 3/11  Lovenox 80mg  at 8am & 8pm and coumadin 3mg  pm 3/12  Lovenox 80mg  at 8am & 8pm and coumadin 3mg  pm 3/13  Lovenox 80mg  at 8am & INR appt @ 1:00pm

## 2016-05-18 NOTE — Patient Instructions (Signed)
Scheduled for thoracentesis on 05/24/16.  Will need to hold coumadin 5 days before and be bridged with lovenox due to AVR.  Wt 78.5kg   SrCr  0.80   CrCl  71.82 Lovenox 80mg  twice daily at 8am & 8pm  3/1  INR 2.4  Take last dose of coumadin 3/2  No lovenox or coumadin 3/3  Lovenox 80mg  at 8am & 8pm 3/4  Lovenox 80mg  at 8am & 8pm 3/5  Lovenox 80mg  at 8am & 8pm 3/6  Lovenox 80mg  at 8am------No lovenox pm 3/7  No Lovenox this am----procedure----coumadin 4mg  pm 3/8  Lovenox 80mg  at 8am & 8pm and coumadin 4mg  pm 3/9  Lovenox 80mg  at 8am & 8pm and coumadin 4mg  pm 3/10  Lovenox 80mg  at 8am & 8pm and coumadin 3mg  pm 3/11  Lovenox 80mg  at 8am & 8pm and coumadin 3mg  pm 3/12  Lovenox 80mg  at 8am & 8pm and coumadin 3mg  pm 3/13  Lovenox 80mg  at 8am & INR appt @ 1:00pm

## 2016-05-23 ENCOUNTER — Telehealth: Payer: Self-pay | Admitting: Cardiology

## 2016-05-23 ENCOUNTER — Other Ambulatory Visit (HOSPITAL_COMMUNITY): Payer: Self-pay | Admitting: Pulmonary Disease

## 2016-05-23 DIAGNOSIS — R0602 Shortness of breath: Secondary | ICD-10-CM

## 2016-05-23 NOTE — Telephone Encounter (Signed)
Having a procedure tomorrow and would like to know how to take her coumadin.

## 2016-05-23 NOTE — Telephone Encounter (Signed)
Reviewed coumadin/lovenox instructions with spouse and he verbalized understanding.

## 2016-05-24 ENCOUNTER — Other Ambulatory Visit (HOSPITAL_COMMUNITY): Payer: Self-pay | Admitting: Diagnostic Radiology

## 2016-05-24 ENCOUNTER — Ambulatory Visit (HOSPITAL_COMMUNITY)
Admission: RE | Admit: 2016-05-24 | Discharge: 2016-05-24 | Disposition: A | Discharge status: Home / Self Care | Payer: Medicare Other | Source: Ambulatory Visit | Attending: Pulmonary Disease | Admitting: Pulmonary Disease

## 2016-05-24 ENCOUNTER — Other Ambulatory Visit (HOSPITAL_COMMUNITY): Payer: Self-pay | Admitting: Pulmonary Disease

## 2016-05-24 ENCOUNTER — Encounter (HOSPITAL_COMMUNITY): Payer: Self-pay

## 2016-05-24 ENCOUNTER — Ambulatory Visit (HOSPITAL_COMMUNITY)
Admission: RE | Admit: 2016-05-24 | Discharge: 2016-05-24 | Disposition: A | Discharge status: Home / Self Care | Payer: Medicare Other | Source: Ambulatory Visit | Attending: Diagnostic Radiology | Admitting: Diagnostic Radiology

## 2016-05-24 DIAGNOSIS — J939 Pneumothorax, unspecified: Secondary | ICD-10-CM | POA: Insufficient documentation

## 2016-05-24 DIAGNOSIS — Z9889 Other specified postprocedural states: Secondary | ICD-10-CM

## 2016-05-24 DIAGNOSIS — R0602 Shortness of breath: Secondary | ICD-10-CM | POA: Insufficient documentation

## 2016-05-24 NOTE — Discharge Instructions (Signed)
Thoracentesis °A thoracentesis is a procedure to remove fluid that has built up in the space between the linings of the chest wall and the lungs (pleural space). It is normal to have a small amount of fluid in the pleural space. Some medical conditions, such as heart failure, pneumonia, kidney problems, or cancer, can create too much fluid. This extra fluid is removed using a needle that is inserted through the skin and tissue and into the pleural space. °A thoracentesis may be done to: °· Understand why there is extra fluid in the pleural space and create a treatment plan that is right for you. °· Help to get rid of shortness of breath, discomfort, or pain that is caused by the extra fluid. °Tell a health care provider about: °· Any allergies you have. °· All medicines you are taking, including vitamins, herbs, eye drops, creams, and over-the-counter medicines. This includes any use of steroids, either by mouth or in a cream. °· Any problems you or family members have had with anesthetic medicines. °· Any blood disorders you have, including any history of blood clots. °· Any surgeries you have had. °· Medical conditions you have, including: °¨ The possibility of pregnancy, if this applies. °¨ Have a frequent cough or coughing episodes. °What are the risks? °Generally, this is a safe procedure. However, problems may occur, including: °· Infection. °· Injury to the lung. °· Lung collapse. °· Bleeding. °What happens before the procedure? °· You may have a chest X-ray or another imaging test, such as a CT scan or ultrasound, to determine the location and amount of fluid in your pleural space. °· Ask your health care provider about: °¨ Changing or stopping your regular medicines. This is especially important if you are taking diabetes medicines or blood thinners. °¨ Taking medicines such as aspirin and ibuprofen. These medicines can thin your blood. Do not take these medicines before your procedure if your health care  provider instructs you not to. °¨ Taking a cough suppressant if you have a frequent cough or coughing episodes. °· Plan to have someone take you home after the procedure. °What happens during the procedure? °· You will be asked to sit upright and lean slightly forward for the procedure. °· An area of your back will be cleaned with a germ-killing solution (antiseptic). °· You will be given a medicine that numbs the area (local anesthetic). °· A needle will be inserted between your ribs and into the pleural space. You may feel pressure or slight pain as the needle is positioned into the pleural space. °· Fluid will be removed from the pleural space through the needle. You may feel pressure as the fluid is removed. °· The needle will be taken out after the excess fluid has been removed. A sample of the fluid may be sent to be examined. °· The needle insertion site (puncture site) will be covered with a bandage (dressing). °The procedure may vary among health care providers and hospitals. °What happens after the procedure? °· A chest X-ray may be done to check the amount of fluid that remains in your pleural space. °· Your blood pressure, heart rate, breathing rate, and blood oxygen level will be monitored often until the medicines you were given have worn off. °· It is your responsibility to obtain your test results. Ask the lab or department performing the test when and how you will get your results. Talk with your health care provider if you have any questions about your results. °This   information is not intended to replace advice given to you by your health care provider. Make sure you discuss any questions you have with your health care provider. °Document Released: 09/19/2004 Document Revised: 11/06/2015 Document Reviewed: 12/16/2013 °Elsevier Interactive Patient Education © 2017 [employer] ° °

## 2016-05-24 NOTE — Sedation Documentation (Signed)
Pt tolerated procedure well.  1.35 liters removed.   bandaid applied and holding pressure.

## 2016-05-30 ENCOUNTER — Ambulatory Visit (INDEPENDENT_AMBULATORY_CARE_PROVIDER_SITE_OTHER): Payer: Medicare Other | Admitting: *Deleted

## 2016-05-30 DIAGNOSIS — I359 Nonrheumatic aortic valve disorder, unspecified: Secondary | ICD-10-CM

## 2016-05-30 DIAGNOSIS — Z952 Presence of prosthetic heart valve: Secondary | ICD-10-CM | POA: Diagnosis not present

## 2016-05-30 LAB — POCT INR: INR: 2.3

## 2016-06-20 ENCOUNTER — Ambulatory Visit (INDEPENDENT_AMBULATORY_CARE_PROVIDER_SITE_OTHER): Payer: Medicare Other | Admitting: *Deleted

## 2016-06-20 DIAGNOSIS — Z952 Presence of prosthetic heart valve: Secondary | ICD-10-CM | POA: Diagnosis not present

## 2016-06-20 DIAGNOSIS — I359 Nonrheumatic aortic valve disorder, unspecified: Secondary | ICD-10-CM | POA: Diagnosis not present

## 2016-06-20 LAB — POCT INR: INR: 2.2

## 2016-06-27 ENCOUNTER — Ambulatory Visit (HOSPITAL_COMMUNITY)
Admission: RE | Admit: 2016-06-27 | Discharge: 2016-06-27 | Disposition: A | Discharge status: Home / Self Care | Payer: Medicare Other | Source: Ambulatory Visit | Attending: Pulmonary Disease | Admitting: Pulmonary Disease

## 2016-06-27 ENCOUNTER — Other Ambulatory Visit (HOSPITAL_COMMUNITY): Payer: Self-pay | Admitting: Pulmonary Disease

## 2016-06-27 DIAGNOSIS — J9811 Atelectasis: Secondary | ICD-10-CM | POA: Diagnosis not present

## 2016-06-27 DIAGNOSIS — J90 Pleural effusion, not elsewhere classified: Secondary | ICD-10-CM

## 2016-06-28 ENCOUNTER — Telehealth: Payer: Self-pay | Admitting: *Deleted

## 2016-06-28 DIAGNOSIS — I10 Essential (primary) hypertension: Secondary | ICD-10-CM

## 2016-06-28 NOTE — Telephone Encounter (Signed)
-----   Message from Arnoldo Lenis, MD sent at 06/28/2016 10:13 AM EDT ----- Can we touch base with this patient. Dr Luan Pulling had contacted me about her perhaps having some extra fluid on her. She is written for lasix 40mg  prn, can we clarify how often she is taking it and get back to me.   [name] MD

## 2016-06-28 NOTE — Telephone Encounter (Signed)
Pt says she is taking lasix 40 mg daily - still c/o SOB/swelling - says she hasn't gained much weight "may a few pounds"

## 2016-06-29 NOTE — Telephone Encounter (Signed)
Have her increase lasix to 60mg  daily, needs BMET/Mg late next week, can she see me in [facility] Friday April 20th after 1pm   [name] MD

## 2016-06-29 NOTE — Telephone Encounter (Signed)
Pt voiced understanding - will mail lab orders and updated medication list. Pt says she had enough furosemide to take 60 mg daily. Added pt to Dr Nelly Laurence schedule in [place] 4/20 @ 1:20pm

## 2016-07-07 ENCOUNTER — Encounter: Payer: Self-pay | Admitting: Cardiology

## 2016-07-07 ENCOUNTER — Other Ambulatory Visit: Payer: Self-pay | Admitting: Cardiology

## 2016-07-07 ENCOUNTER — Ambulatory Visit (INDEPENDENT_AMBULATORY_CARE_PROVIDER_SITE_OTHER): Payer: Medicare Other | Admitting: Cardiology

## 2016-07-07 VITALS — BP 124/60 | HR 56 | Ht 69.0 in | Wt 175.0 lb

## 2016-07-07 DIAGNOSIS — R0602 Shortness of breath: Secondary | ICD-10-CM | POA: Diagnosis not present

## 2016-07-07 DIAGNOSIS — J90 Pleural effusion, not elsewhere classified: Secondary | ICD-10-CM

## 2016-07-07 LAB — BASIC METABOLIC PANEL
BUN: 20 mg/dL (ref 7–25)
CALCIUM: 9.3 mg/dL (ref 8.6–10.4)
CHLORIDE: 102 mmol/L (ref 98–110)
CO2: 28 mmol/L (ref 20–31)
Creat: 1.28 mg/dL — ABNORMAL HIGH (ref 0.60–0.93)
Glucose, Bld: 123 mg/dL — ABNORMAL HIGH (ref 65–99)
Potassium: 4 mmol/L (ref 3.5–5.3)
Sodium: 142 mmol/L (ref 135–146)

## 2016-07-07 NOTE — Progress Notes (Signed)
Clinical Summary Mary [name] is a 76 y.o.female  seen today for follow up of the following medical problems. This is a focused visit on recent issues with SOB and pleural effusions. For more detailed history please refer to prior clinic notes.    1. Pleural effusion/SOB - recent thoracentesis. Asked by Dr Luan Pulling to evaluate for possible CHF as cause. She has had some increased SOB and edema - her lasix was increased from 40mg  to 60mg  daily. Since that time no real change in her symptoms.  - repeat labs done today - Breathing is variable. Swelling remains variable.   Past Medical History:  Diagnosis Date  . Anxiety   . Diabetes mellitus with neuropathy ([place])   . Diabetic neuropathy ([place])   . DJD (degenerative joint disease)   . H/O aortic valve replacement   . Hyperlipidemia   . Hypertension   . Neuromuscular disorder (HCC)    neuropathy in feet  . Obesity   . Sleep apnea   . Stroke [facility])    " light stroke"  . Wears glasses      Allergies  Allergen Reactions  . Tape Itching    Redness, Please use "paper" tape only     Current Outpatient Prescriptions  Medication Sig Dispense Refill  . acetaminophen (TYLENOL) 500 MG tablet Take 500-1,000 mg by mouth daily as needed for moderate pain.    Marland Kitchen ALPRAZolam (XANAX) 0.5 MG tablet Take 0.5 mg by mouth 4 (four) times daily as needed for anxiety or sleep (every night for sleep).     Marland Kitchen aspirin EC 81 MG tablet Take 81 mg by mouth daily.    . bisacodyl (DULCOLAX) 5 MG EC tablet Take 5 mg by mouth daily as needed for moderate constipation.    . cetirizine (ZYRTEC) 10 MG tablet Take 10 mg by mouth daily as needed for allergies.   11  . [facility] MELATONIN 3 MG TABS Take 3 mg by mouth at bedtime.  11  . enoxaparin (LOVENOX) 80 MG/0.8ML injection Inject 0.8 mLs (80 mg total) into the skin every 12 (twelve) hours. 13 Syringe 1  . ergocalciferol (VITAMIN D2) 50000 units capsule Take 50,000 Units by mouth every Friday.    . fluticasone  (FLONASE) 50 MCG/ACT nasal spray Place 2 sprays into both nostrils daily.   11  . furosemide (LASIX) 40 MG tablet Take 60 mg by mouth daily.    Marland Kitchen gabapentin (NEURONTIN) 100 MG capsule Take 100 mg by mouth daily.  11  . HYDROcodone-acetaminophen (NORCO) 10-325 MG tablet Take 1 tablet by mouth as needed.   0  . lisinopril (PRINIVIL,ZESTRIL) 10 MG tablet TAKE 1 TABLET BY MOUTH EVERY DAY FOR HIGH BP AND HEART  3  . metoprolol (TOPROL-XL) 50 MG 24 hr tablet Take 50 mg by mouth daily.     . pantoprazole (PROTONIX) 40 MG tablet Take 1 tablet (40 mg total) by mouth daily before breakfast. 30 tablet 5  . simvastatin (ZOCOR) 40 MG tablet Take 40 mg by mouth daily at 6 PM.     . warfarin (COUMADIN) 2 MG tablet Take 1 1/2 tablets daily except 2 tablets on Mondays, Wednesdays and Fridays 150 tablet 3   No current facility-administered medications for this visit.      Past Surgical History:  Procedure Laterality Date  . ANKLE SURGERY     Left tendon repair  . AORTIC VALVE REPLACEMENT  03/1994  . CARDIAC CATHETERIZATION     1996 [facility]  .  CERVICAL SPINE SURGERY    . COLONOSCOPY    . HEMORRHOID SURGERY    . KNEE ARTHROSCOPY Right 04/13/2015   Procedure: RIGHT KNEE ARTHROSCOPY WITH DEBRIDEMENT AND PARTIAL MEDIAL MENISCECTOMY;  Surgeon: Mcarthur Rossetti, MD;  Location: [place];  Service: Orthopedics;  Laterality: Right;  . KNEE ARTHROSCOPY W/ MENISCECTOMY Right 04/13/2015  . LUMBAR FUSION       Allergies  Allergen Reactions  . Tape Itching    Redness, Please use "paper" tape only      Family History  Problem Relation Age of Onset  . Pneumonia Sister   . Dementia Mother      Social History Mary [name] reports that she has never smoked. She has never used smokeless tobacco. Mary [name] reports that she does not drink alcohol.   Review of Systems CONSTITUTIONAL: No weight loss, fever, chills, weakness or fatigue.  HEENT: Eyes: No visual loss, blurred vision, double vision or yellow  sclerae.No hearing loss, sneezing, congestion, runny nose or sore throat.  SKIN: No rash or itching.  CARDIOVASCULAR: no chest pain, no palpitations.  RESPIRATORY: per hpi GASTROINTESTINAL: No anorexia, nausea, vomiting or diarrhea. No abdominal pain or blood.  GENITOURINARY: No burning on urination, no polyuria NEUROLOGICAL: No headache, dizziness, syncope, paralysis, ataxia, numbness or tingling in the extremities. No change in bowel or bladder control.  MUSCULOSKELETAL: No muscle, back pain, joint pain or stiffness.  LYMPHATICS: No enlarged nodes. No history of splenectomy.  PSYCHIATRIC: No history of depression or anxiety.  ENDOCRINOLOGIC: No reports of sweating, cold or heat intolerance. No polyuria or polydipsia.  Marland Kitchen   Physical Examination Vitals:   07/07/16 1321  BP: 124/60  Pulse: (!) 56   Vitals:   07/07/16 1321  Weight: 175 lb (79.4 kg)  Height: 5\' 9"  (1.753 m)    Gen: resting comfortably, no acute distress HEENT: no scleral icterus, pupils equal round and reactive, no palptable cervical adenopathy,  CV: RRR, no m/r/g, no jvd Resp: decreased breath sounds right base GI: abdomen is soft, non-tender, non-distended, normal bowel sounds, no hepatosplenomegaly MSK: extremities are warm, no edema.  Skin: warm, no rash Neuro:  no focal deficits Psych: appropriate affect   Diagnostic Studies  10/2015 echo: LVEF 55-60%, cannot evaluate diastolic function, normal mechanical AV,    Assessment and Plan   1.SOB/pleural effusions - we will see if symptoms improve with increased diuresis.  - we will increase her lasix to 60mg  in AM and 40mg  in PM x 3 days then resume 60mg  daily. - she will call us Monday to update [place] on her symptoms and weights.  - f/u labs she had done today.      Arnoldo Lenis, M.D.

## 2016-07-07 NOTE — Patient Instructions (Signed)
Medication Instructions:  TAKE LASIX 60 MG IN THE MORNING & 40 MG IN THE EVENING FOR THE NEXT 3 DAYS, THEN TAKE 60 MG DAILY   Labwork: NONE  Testing/Procedures: NONE  Follow-Up: Your physician recommends that you schedule a follow-up appointment in: 3 MONTHS    Any Other Special Instructions Will Be Listed Below (If Applicable).  PLEASE CALL us Monday WITH AN UPDATE ON WEIGHTS AND SYMPTOMS.   If you need a refill on your cardiac medications before your next appointment, please call your pharmacy.

## 2016-07-08 LAB — MAGNESIUM: Magnesium: 2.1 mg/dL (ref 1.5–2.5)

## 2016-07-10 ENCOUNTER — Telehealth: Payer: Self-pay

## 2016-07-10 LAB — ALLERGEN, BOTRYTIS CINEREA, M7: Allergen, Botrytis cinerea,m7: <0.1 kU/L

## 2016-07-10 NOTE — Telephone Encounter (Signed)
4/23 lmtcb on pt's vm asking her to hold lasix today and tomorrow -cc

## 2016-07-10 NOTE — Telephone Encounter (Signed)
-----   Message from Arnoldo Lenis, MD sent at 07/10/2016  3:05 PM EDT ----- Labs show some increasesd stress on her kidneys. I would not take lasix tonight or tomorrow, on Wednesday I would go back to 40mg  daily. How are her symptoms doing?  [name] MD

## 2016-07-11 ENCOUNTER — Ambulatory Visit (INDEPENDENT_AMBULATORY_CARE_PROVIDER_SITE_OTHER): Payer: Medicare Other | Admitting: *Deleted

## 2016-07-11 DIAGNOSIS — Z952 Presence of prosthetic heart valve: Secondary | ICD-10-CM

## 2016-07-11 DIAGNOSIS — I359 Nonrheumatic aortic valve disorder, unspecified: Secondary | ICD-10-CM | POA: Diagnosis not present

## 2016-07-11 LAB — POCT INR: INR: 2.4

## 2016-08-03 ENCOUNTER — Ambulatory Visit (INDEPENDENT_AMBULATORY_CARE_PROVIDER_SITE_OTHER): Payer: Medicare Other | Admitting: *Deleted

## 2016-08-03 DIAGNOSIS — I359 Nonrheumatic aortic valve disorder, unspecified: Secondary | ICD-10-CM

## 2016-08-03 DIAGNOSIS — Z952 Presence of prosthetic heart valve: Secondary | ICD-10-CM | POA: Diagnosis not present

## 2016-08-03 LAB — POCT INR: INR: 3.2

## 2016-08-16 ENCOUNTER — Other Ambulatory Visit (HOSPITAL_COMMUNITY): Payer: Self-pay | Admitting: Family Medicine

## 2016-08-16 ENCOUNTER — Ambulatory Visit (HOSPITAL_COMMUNITY)
Admission: RE | Admit: 2016-08-16 | Discharge: 2016-08-16 | Disposition: A | Discharge status: Home / Self Care | Payer: Medicare Other | Source: Ambulatory Visit | Attending: Family Medicine | Admitting: Family Medicine

## 2016-08-16 DIAGNOSIS — R0602 Shortness of breath: Secondary | ICD-10-CM

## 2016-08-16 DIAGNOSIS — J90 Pleural effusion, not elsewhere classified: Secondary | ICD-10-CM | POA: Diagnosis not present

## 2016-08-31 ENCOUNTER — Ambulatory Visit (INDEPENDENT_AMBULATORY_CARE_PROVIDER_SITE_OTHER): Payer: Medicare Other | Admitting: *Deleted

## 2016-08-31 DIAGNOSIS — Z952 Presence of prosthetic heart valve: Secondary | ICD-10-CM

## 2016-08-31 DIAGNOSIS — I359 Nonrheumatic aortic valve disorder, unspecified: Secondary | ICD-10-CM | POA: Diagnosis not present

## 2016-08-31 LAB — POCT INR: INR: 3.7

## 2016-09-19 ENCOUNTER — Ambulatory Visit (INDEPENDENT_AMBULATORY_CARE_PROVIDER_SITE_OTHER): Payer: Medicare Other | Admitting: *Deleted

## 2016-09-19 DIAGNOSIS — I359 Nonrheumatic aortic valve disorder, unspecified: Secondary | ICD-10-CM

## 2016-09-19 DIAGNOSIS — Z952 Presence of prosthetic heart valve: Secondary | ICD-10-CM

## 2016-09-19 LAB — POCT INR: INR: 3.2

## 2016-10-12 ENCOUNTER — Encounter: Payer: Self-pay | Admitting: Cardiology

## 2016-10-12 ENCOUNTER — Ambulatory Visit (INDEPENDENT_AMBULATORY_CARE_PROVIDER_SITE_OTHER): Payer: Medicare Other | Admitting: *Deleted

## 2016-10-12 ENCOUNTER — Ambulatory Visit (INDEPENDENT_AMBULATORY_CARE_PROVIDER_SITE_OTHER): Payer: Medicare Other | Admitting: Cardiology

## 2016-10-12 VITALS — BP 156/69 | HR 62 | Ht 66.0 in | Wt 188.0 lb

## 2016-10-12 DIAGNOSIS — Z952 Presence of prosthetic heart valve: Secondary | ICD-10-CM | POA: Diagnosis not present

## 2016-10-12 DIAGNOSIS — R0789 Other chest pain: Secondary | ICD-10-CM

## 2016-10-12 DIAGNOSIS — I359 Nonrheumatic aortic valve disorder, unspecified: Secondary | ICD-10-CM

## 2016-10-12 DIAGNOSIS — E782 Mixed hyperlipidemia: Secondary | ICD-10-CM

## 2016-10-12 DIAGNOSIS — J90 Pleural effusion, not elsewhere classified: Secondary | ICD-10-CM

## 2016-10-12 DIAGNOSIS — I10 Essential (primary) hypertension: Secondary | ICD-10-CM | POA: Diagnosis not present

## 2016-10-12 DIAGNOSIS — R0602 Shortness of breath: Secondary | ICD-10-CM

## 2016-10-12 LAB — POCT INR: INR: 3.9

## 2016-10-12 NOTE — Patient Instructions (Signed)
Your physician wants you to follow-up in:  Mary [name] will receive a reminder letter in the mail two months in advance. If you don't receive a letter, please call our office to schedule the follow-up appointment.  Your physician recommends that you continue on your current medications as directed. Please refer to the Current Medication list given to you today.  Your physician recommends that you return for lab work - PLEASE FAST 6-8 PRIOR TO LAB WORK - BMP/LIPIDS/MG  PLEASE CALL us TO SCHEDULE STRESS TEST   Thank you for choosing [facility]!!

## 2016-10-12 NOTE — Progress Notes (Signed)
Clinical Summary Mary [name] is a 76 y.o.female seen today for follow up of the following medical problems.   1. Pleural effusion/SOB - followed by Dr Luan Pulling - we tried increasing her lasix to 60mg  daily however she developed AKI, and dose was decreased back to 40mg  daily. - still some SOB at times. No recent LE edema.   2. Aortic valve replacmenet - [employer] AVF in 1995/1996 - 10/2015 echo LVEF 55-60%, cannot eval diastolic function, normal AVR with mild AI  - no recent symptoms  3. HTN - she is compliant with meds   4. Hyperlipidemia - compliant with statin  5. OSA - noncompliant with cpap  6. History of CVA - followed by pcp  7. DMII - diet controlled   8. Chest pain - reports occasional chest pain. Aching pain midchest, 3-4/10. Tends to come on with activity. Can have some SOB. Not positional. Lasts few minutes. Only occurs with activity, can have some easy fatigue. Ongoing for several years. No change in severity.   - still with chest pain at times since last visit.  9. 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    Past Medical History:  Diagnosis Date  . Anxiety   . Diabetes mellitus with neuropathy ([place])   . Diabetic neuropathy ([place])   . DJD (degenerative joint disease)   . H/O aortic valve replacement   . Hyperlipidemia   . Hypertension   . Neuromuscular disorder (HCC)    neuropathy in feet  . Obesity   . Sleep apnea   . Stroke [facility])    " light stroke"  . Wears glasses      Allergies  Allergen Reactions  . Tape Itching    Redness, Please use "paper" tape only     Current Outpatient Prescriptions  Medication Sig Dispense Refill  . acetaminophen (TYLENOL) 500 MG tablet Take 500-1,000 mg by mouth daily as needed for moderate pain.    Marland Kitchen ALPRAZolam (XANAX) 0.5 MG tablet Take 0.5 mg by mouth 4 (four) times daily as needed for anxiety or sleep (every night for sleep).      Marland Kitchen aspirin EC 81 MG tablet Take 81 mg by mouth daily.    . bisacodyl (DULCOLAX) 5 MG EC tablet Take 5 mg by mouth daily as needed for moderate constipation.    . cetirizine (ZYRTEC) 10 MG tablet Take 10 mg by mouth daily as needed for allergies.   11  . [facility]S MELATONIN 3 MG TABS Take 3 mg by mouth at bedtime.  11  . enoxaparin (LOVENOX) 80 MG/0.8ML injection Inject 0.8 mLs (80 mg total) into the skin every 12 (twelve) hours. 13 Syringe 1  . fluticasone (FLONASE) 50 MCG/ACT nasal spray Place 2 sprays into both nostrils daily.   11  . furosemide (LASIX) 40 MG tablet Take 40 mg by mouth daily.     Marland Kitchen gabapentin (NEURONTIN) 100 MG capsule Take 100 mg by mouth daily.  11  . HYDROcodone-acetaminophen (NORCO) 10-325 MG tablet Take 1 tablet by mouth as needed.   0  . lisinopril (PRINIVIL,ZESTRIL) 10 MG tablet TAKE 1 TABLET BY MOUTH EVERY DAY FOR HIGH BP AND HEART  3  . metoprolol (TOPROL-XL) 50 MG 24 hr tablet Take 50 mg by mouth daily.     . pantoprazole (PROTONIX) 40 MG tablet Take 1 tablet (40 mg total) by mouth daily before breakfast. 30 tablet 5  .  simvastatin (ZOCOR) 40 MG tablet Take 40 mg by mouth daily at 6 PM.     . warfarin (COUMADIN) 2 MG tablet Take 1 1/2 tablets daily except 2 tablets on Mondays, Wednesdays and Fridays 150 tablet 3   No current facility-administered medications for this visit.      Past Surgical History:  Procedure Laterality Date  . ANKLE SURGERY     Left tendon repair  . AORTIC VALVE REPLACEMENT  03/1994  . CARDIAC CATHETERIZATION     1996 [facility]  . CERVICAL SPINE SURGERY    . COLONOSCOPY    . HEMORRHOID SURGERY    . KNEE ARTHROSCOPY Right 04/13/2015   Procedure: RIGHT KNEE ARTHROSCOPY WITH DEBRIDEMENT AND PARTIAL MEDIAL MENISCECTOMY;  Surgeon: Mcarthur Rossetti, MD;  Location: [place];  Service: Orthopedics;  Laterality: Right;  . KNEE ARTHROSCOPY W/ MENISCECTOMY Right 04/13/2015  . LUMBAR FUSION       Allergies  Allergen Reactions  . Tape Itching      Redness, Please use "paper" tape only      Family History  Problem Relation Age of Onset  . Pneumonia Sister   . Dementia Mother      Social History Mary [name] reports that she has never smoked. She has never used smokeless tobacco. Mary [name] reports that she does not drink alcohol.   Review of Systems CONSTITUTIONAL: No weight loss, fever, chills, weakness or fatigue.  HEENT: Eyes: No visual loss, blurred vision, double vision or yellow sclerae.No hearing loss, sneezing, congestion, runny nose or sore throat.  SKIN: No rash or itching.  CARDIOVASCULAR: per hpi RESPIRATORY: No shortness of breath, cough or sputum.  GASTROINTESTINAL: No anorexia, nausea, vomiting or diarrhea. No abdominal pain or blood.  GENITOURINARY: No burning on urination, no polyuria NEUROLOGICAL: No headache, dizziness, syncope, paralysis, ataxia, numbness or tingling in the extremities. No change in bowel or bladder control.  MUSCULOSKELETAL: No muscle, back pain, joint pain or stiffness.  LYMPHATICS: No enlarged nodes. No history of splenectomy.  PSYCHIATRIC: No history of depression or anxiety.  ENDOCRINOLOGIC: No reports of sweating, cold or heat intolerance. No polyuria or polydipsia.  Marland Kitchen   Physical Examination Vitals:   10/12/16 1417  BP: (!) 156/69  Pulse: 62   Vitals:   10/12/16 1417  Weight: 188 lb (85.3 kg)  Height: 5\' 6"  (1.676 m)    Gen: resting comfortably, no acute distress HEENT: no scleral icterus, pupils equal round and reactive, no palptable cervical adenopathy,  CV: RRR, no m/r/g, no jvd Resp: Clear to auscultation bilaterally GI: abdomen is soft, non-tender, non-distended, normal bowel sounds, no hepatosplenomegaly MSK: extremities are warm, no edema.  Skin: warm, no rash Neuro:  no focal deficits Psych: appropriate affect   Diagnostic Studies 10/2015 echo: LVEF 55-60%, cannot evaluate diastolic function, normal mechanical AV,     Assessment and Plan  1.SOB/pleural  effusions - continue to follow with pulmonary - did not tolerate higher doses of diuretics, check BMET/Mg.    2.Aortic valve replacement - metal valve placed in 1995/1996, normal function by last echo 10/2015 - continue current meds  3. HTN - manaul check 140/70 is at goal, continue current meds  4. Hyperlipidemia - continue current statin - repeat lipid panel  5. Chest pain - EKG SR without acute ischemic changes - we will plan for a lexiscan MPI ot further evaluate   F/u 6 months   Arnoldo Lenis, M.D.

## 2016-11-02 ENCOUNTER — Ambulatory Visit (INDEPENDENT_AMBULATORY_CARE_PROVIDER_SITE_OTHER): Payer: Medicare Other | Admitting: *Deleted

## 2016-11-02 DIAGNOSIS — Z952 Presence of prosthetic heart valve: Secondary | ICD-10-CM

## 2016-11-02 DIAGNOSIS — I359 Nonrheumatic aortic valve disorder, unspecified: Secondary | ICD-10-CM | POA: Diagnosis not present

## 2016-11-02 LAB — POCT INR: INR: 6.2

## 2016-11-09 ENCOUNTER — Ambulatory Visit (INDEPENDENT_AMBULATORY_CARE_PROVIDER_SITE_OTHER): Payer: Medicare Other | Admitting: *Deleted

## 2016-11-09 DIAGNOSIS — Z952 Presence of prosthetic heart valve: Secondary | ICD-10-CM | POA: Diagnosis not present

## 2016-11-09 DIAGNOSIS — I359 Nonrheumatic aortic valve disorder, unspecified: Secondary | ICD-10-CM | POA: Diagnosis not present

## 2016-11-09 LAB — POCT INR: INR: 3.3

## 2016-12-07 ENCOUNTER — Encounter: Payer: Self-pay | Admitting: *Deleted

## 2016-12-19 ENCOUNTER — Ambulatory Visit (INDEPENDENT_AMBULATORY_CARE_PROVIDER_SITE_OTHER): Payer: Medicare Other | Admitting: *Deleted

## 2016-12-19 DIAGNOSIS — Z952 Presence of prosthetic heart valve: Secondary | ICD-10-CM | POA: Diagnosis not present

## 2016-12-19 DIAGNOSIS — I639 Cerebral infarction, unspecified: Secondary | ICD-10-CM

## 2016-12-19 DIAGNOSIS — Z5181 Encounter for therapeutic drug level monitoring: Secondary | ICD-10-CM | POA: Diagnosis not present

## 2016-12-19 DIAGNOSIS — I359 Nonrheumatic aortic valve disorder, unspecified: Secondary | ICD-10-CM

## 2016-12-19 LAB — POCT INR: INR: 2.9

## 2017-01-16 ENCOUNTER — Other Ambulatory Visit (HOSPITAL_COMMUNITY)
Admission: RE | Admit: 2017-01-16 | Discharge: 2017-01-16 | Disposition: A | Discharge status: Home / Self Care | Payer: Medicare Other | Source: Ambulatory Visit | Attending: Family Medicine | Admitting: Family Medicine

## 2017-01-16 ENCOUNTER — Ambulatory Visit (HOSPITAL_COMMUNITY)
Admission: RE | Admit: 2017-01-16 | Discharge: 2017-01-16 | Disposition: A | Discharge status: Home / Self Care | Payer: Medicare Other | Source: Ambulatory Visit | Attending: Family Medicine | Admitting: Family Medicine

## 2017-01-16 ENCOUNTER — Ambulatory Visit (INDEPENDENT_AMBULATORY_CARE_PROVIDER_SITE_OTHER): Payer: Medicare Other | Admitting: *Deleted

## 2017-01-16 ENCOUNTER — Other Ambulatory Visit (HOSPITAL_COMMUNITY): Payer: Self-pay | Admitting: Family Medicine

## 2017-01-16 DIAGNOSIS — J9811 Atelectasis: Secondary | ICD-10-CM | POA: Diagnosis not present

## 2017-01-16 DIAGNOSIS — J90 Pleural effusion, not elsewhere classified: Secondary | ICD-10-CM | POA: Insufficient documentation

## 2017-01-16 DIAGNOSIS — I359 Nonrheumatic aortic valve disorder, unspecified: Secondary | ICD-10-CM | POA: Diagnosis not present

## 2017-01-16 DIAGNOSIS — R899 Unspecified abnormal finding in specimens from other organs, systems and tissues: Secondary | ICD-10-CM

## 2017-01-16 DIAGNOSIS — I639 Cerebral infarction, unspecified: Secondary | ICD-10-CM | POA: Diagnosis not present

## 2017-01-16 DIAGNOSIS — Z952 Presence of prosthetic heart valve: Secondary | ICD-10-CM

## 2017-01-16 LAB — CBC WITH DIFFERENTIAL/PLATELET
BASOS ABS: 0 10*3/uL (ref 0.0–0.1)
BASOS PCT: 1 %
EOS ABS: 0.5 10*3/uL (ref 0.0–0.7)
EOS PCT: 9 %
HCT: 37.5 % (ref 36.0–46.0)
Hemoglobin: 12.3 g/dL (ref 12.0–15.0)
Lymphocytes Relative: 22 %
Lymphs Abs: 1.3 10*3/uL (ref 0.7–4.0)
MCH: 34.2 pg — ABNORMAL HIGH (ref 26.0–34.0)
MCHC: 32.8 g/dL (ref 30.0–36.0)
MCV: 104.2 fL — ABNORMAL HIGH (ref 78.0–100.0)
MONOS PCT: 8 %
Monocytes Absolute: 0.4 10*3/uL (ref 0.1–1.0)
Neutro Abs: 3.6 10*3/uL (ref 1.7–7.7)
Neutrophils Relative %: 60 %
PLATELETS: 132 10*3/uL — AB (ref 150–400)
RBC: 3.6 MIL/uL — ABNORMAL LOW (ref 3.87–5.11)
RDW: 15.6 % — AB (ref 11.5–15.5)
WBC: 5.9 10*3/uL (ref 4.0–10.5)

## 2017-01-16 LAB — BASIC METABOLIC PANEL
Anion gap: 8 (ref 5–15)
BUN: 13 mg/dL (ref 6–20)
CO2: 25 mmol/L (ref 22–32)
CREATININE: 1 mg/dL (ref 0.44–1.00)
Calcium: 9.1 mg/dL (ref 8.9–10.3)
Chloride: 109 mmol/L (ref 101–111)
GFR, EST NON AFRICAN AMERICAN: 53 mL/min — AB (ref >60)
GLUCOSE: 102 mg/dL — AB (ref 65–99)
Potassium: 3.9 mmol/L (ref 3.5–5.1)
Sodium: 142 mmol/L (ref 135–145)

## 2017-01-16 LAB — POCT INR: INR: 3.9

## 2017-01-17 ENCOUNTER — Telehealth: Payer: Self-pay | Admitting: *Deleted

## 2017-01-17 NOTE — Telephone Encounter (Signed)
Patient's husband would like return phone call about starting lovenox shots. / tg

## 2017-01-17 NOTE — Telephone Encounter (Signed)
Spouse states pt is scheduled for thoracentesis on 01/25/17 per Dr Karie Kirks.  Pt will need to stop coumadin and be bridged with lovenox.  Procedure is not on schedule yet.  Call Dr Vickey Sages office tomorrow to confirm.  [phone].

## 2017-01-18 MED ORDER — ENOXAPARIN SODIUM 80 MG/0.8ML ~~LOC~~ SOLN: 80.0000 mg | Freq: Two times a day (BID) | SUBCUTANEOUS | 0 refills | Status: DC

## 2017-01-18 NOTE — Telephone Encounter (Signed)
Unable to get in touch with anyone at Dr Vickey Sages office late yesterday afternoon or today (? Closed on Thursdays) to find out if thoracentesis has been scheduled on 11/8 like spouse says.  Went ahead and filled out calendar for stopping coumadin and starting Lovenox bridge.  Spouse will pick it up tomorrow from the [place] office and call Dr Vickey Sages office to confirm date and time of thoracentesis.    Lovenox 80mg  syringes  #20  No Refills sent to [facility].  11/2  Last dose of coumadin  11/3  No lovenox or coumadin 11/4   Lovenox 80mg  sq twice daily at 8am & 8pm 11/5   Lovenox 80mg  sq twice daily at 8am & 8pm 11/6   Lovenox 80mg  sq twice daily at 8am & 8pm 11/7   Lovenox 80mg  sq at 8am & No lovenox pm 11/8  No lovenox in am -----procedure----coumadin 4mg  pm 11/9    Lovenox 80mg  sq twice daily at 8am & 8pm & coumadin 4mg  pm 11/10   Lovenox 80mg  sq twice daily at 8am & 8pm & coumadin 4mg  pm 11/11  Lovenox 80mg  sq twice daily at 8am & 8pm & coumadin 4mg  pm 11/12  Lovenox 80mg  sq twice daily at 8am & 8pm & coumadin 4mg  pm 11/13  Lovenox 80mg  sq at 8am ------INR appt @ 1:00pm

## 2017-01-22 ENCOUNTER — Other Ambulatory Visit (HOSPITAL_COMMUNITY): Payer: Self-pay | Admitting: Family Medicine

## 2017-01-22 DIAGNOSIS — J90 Pleural effusion, not elsewhere classified: Secondary | ICD-10-CM

## 2017-01-23 ENCOUNTER — Other Ambulatory Visit (HOSPITAL_COMMUNITY)
Admission: RE | Admit: 2017-01-23 | Discharge: 2017-01-23 | Disposition: A | Discharge status: Home / Self Care | Payer: Medicare Other | Source: Ambulatory Visit | Attending: Family Medicine | Admitting: Family Medicine

## 2017-01-23 DIAGNOSIS — R69 Illness, unspecified: Secondary | ICD-10-CM | POA: Insufficient documentation

## 2017-01-23 LAB — PROTIME-INR
INR: 1.63
PROTHROMBIN TIME: 19.2 s — AB (ref 11.4–15.2)

## 2017-01-24 ENCOUNTER — Other Ambulatory Visit (HOSPITAL_COMMUNITY)
Admission: RE | Admit: 2017-01-24 | Discharge: 2017-01-24 | Disposition: A | Discharge status: Home / Self Care | Payer: Medicare Other | Source: Ambulatory Visit | Attending: Family Medicine | Admitting: Family Medicine

## 2017-01-24 DIAGNOSIS — R69 Illness, unspecified: Secondary | ICD-10-CM | POA: Diagnosis present

## 2017-01-24 LAB — PROTIME-INR
INR: 1.45
Prothrombin Time: 17.5 seconds — ABNORMAL HIGH (ref 11.4–15.2)

## 2017-01-25 ENCOUNTER — Ambulatory Visit (HOSPITAL_COMMUNITY)
Admission: RE | Admit: 2017-01-25 | Discharge: 2017-01-25 | Disposition: A | Discharge status: Home / Self Care | Payer: Medicare Other | Source: Ambulatory Visit | Attending: Family Medicine | Admitting: Family Medicine

## 2017-01-25 ENCOUNTER — Ambulatory Visit (HOSPITAL_COMMUNITY)
Admission: RE | Admit: 2017-01-25 | Discharge: 2017-01-25 | Disposition: A | Discharge status: Home / Self Care | Payer: Medicare Other | Source: Ambulatory Visit | Attending: Radiology | Admitting: Radiology

## 2017-01-25 ENCOUNTER — Encounter (HOSPITAL_COMMUNITY): Payer: Self-pay

## 2017-01-25 DIAGNOSIS — J811 Chronic pulmonary edema: Secondary | ICD-10-CM | POA: Insufficient documentation

## 2017-01-25 DIAGNOSIS — Z9889 Other specified postprocedural states: Secondary | ICD-10-CM

## 2017-01-25 DIAGNOSIS — I517 Cardiomegaly: Secondary | ICD-10-CM | POA: Insufficient documentation

## 2017-01-25 DIAGNOSIS — J90 Pleural effusion, not elsewhere classified: Secondary | ICD-10-CM

## 2017-01-25 LAB — GRAM STAIN: Gram Stain: NONE SEEN

## 2017-01-25 LAB — LACTATE DEHYDROGENASE, PLEURAL OR PERITONEAL FLUID: LD FL: 112 U/L — AB (ref 3–23)

## 2017-01-25 LAB — BODY FLUID CELL COUNT WITH DIFFERENTIAL
EOS FL: 0 %
LYMPHS FL: 22 %
MONOCYTE-MACROPHAGE-SEROUS FLUID: 58 % (ref 50–90)
Neutrophil Count, Fluid: 20 % (ref 0–25)
OTHER CELLS FL: 0 %
Total Nucleated Cell Count, Fluid: 132 cu mm (ref 0–1000)

## 2017-01-25 LAB — GLUCOSE, PLEURAL OR PERITONEAL FLUID: Glucose, Fluid: 81 mg/dL

## 2017-01-25 LAB — PROTEIN, PLEURAL OR PERITONEAL FLUID: Total protein, fluid: 3.6 g/dL

## 2017-01-25 NOTE — Procedures (Signed)
  US guided Rt Thoracentesis  1.2 L yellow fluid Sent for labs per MD  Tolerated well  CXR pending

## 2017-01-25 NOTE — Progress Notes (Signed)
Thoracentesis complete no signs of distress, 1.2L yellow colored pleural fluid removed.

## 2017-01-29 LAB — PH, BODY FLUID: pH, Body Fluid: 7.6

## 2017-01-29 LAB — CHOLESTEROL, BODY FLUID: CHOL FL: 34 mg/dL

## 2017-01-30 ENCOUNTER — Ambulatory Visit (INDEPENDENT_AMBULATORY_CARE_PROVIDER_SITE_OTHER): Payer: Medicare Other | Admitting: *Deleted

## 2017-01-30 DIAGNOSIS — I359 Nonrheumatic aortic valve disorder, unspecified: Secondary | ICD-10-CM | POA: Diagnosis not present

## 2017-01-30 DIAGNOSIS — Z952 Presence of prosthetic heart valve: Secondary | ICD-10-CM

## 2017-01-30 LAB — CULTURE, BODY FLUID W GRAM STAIN -BOTTLE: Culture: NO GROWTH

## 2017-01-30 LAB — CULTURE, BODY FLUID-BOTTLE

## 2017-01-30 LAB — POCT INR: INR: 2.1

## 2017-02-02 ENCOUNTER — Ambulatory Visit (HOSPITAL_COMMUNITY)
Admission: RE | Admit: 2017-02-02 | Discharge: 2017-02-02 | Disposition: A | Discharge status: Home / Self Care | Payer: Medicare Other | Source: Ambulatory Visit | Attending: Family Medicine | Admitting: Family Medicine

## 2017-02-02 ENCOUNTER — Other Ambulatory Visit (HOSPITAL_COMMUNITY): Payer: Self-pay | Admitting: Family Medicine

## 2017-02-02 DIAGNOSIS — J90 Pleural effusion, not elsewhere classified: Secondary | ICD-10-CM | POA: Insufficient documentation

## 2017-02-02 DIAGNOSIS — R0602 Shortness of breath: Secondary | ICD-10-CM

## 2017-02-05 ENCOUNTER — Encounter: Payer: Self-pay | Admitting: Cardiology

## 2017-02-05 ENCOUNTER — Ambulatory Visit: Payer: Medicare Other | Admitting: Cardiology

## 2017-02-05 VITALS — BP 126/64 | HR 73 | Ht 66.0 in | Wt 186.0 lb

## 2017-02-05 DIAGNOSIS — J90 Pleural effusion, not elsewhere classified: Secondary | ICD-10-CM

## 2017-02-05 DIAGNOSIS — R0602 Shortness of breath: Secondary | ICD-10-CM

## 2017-02-05 NOTE — Progress Notes (Signed)
Clinical Summary Ms. Harris is a 76 y.o.female seen today for follow up of the following medical problems. This is a focused visit on her history of SOB and recurrent pleural effusion  1. Pleural effusion/SOB - followed by Dr Luan Pulling - we tried increasing her lasix to 60mg  daily back in 06/2016  however she developed AKI(Cr 0.8 to 1.28) last Cr down to 1.  , and dose was decreased back to 40mg  daily.  01/16/17 large right pleural effusion  - thoracentesis 01/25/17 with 1.2 liters removed. Cultures negative, gram stain +PMNs, pH 7.6, cytology reactive mesothelial cells - pleural LDH 112 - pleural protein: 3.6 - I do not have the serum LDH and totral protein numbers available to me -Repeat CXR 02/02/17 showed reaccumulating effusion - 10/2015 echo LVEF 55-60%, normal AVR - increaseing SOB      SH: husband had recent shoulder surgery, still recovering.  Past Medical History:  Diagnosis Date  . Anxiety   . Diabetes mellitus with neuropathy ([place])   . Diabetic neuropathy ([place])   . DJD (degenerative joint disease)   . H/O aortic valve replacement   . Hyperlipidemia   . Hypertension   . Neuromuscular disorder (HCC)    neuropathy in feet  . Obesity   . Sleep apnea   . Stroke [facility])    " light stroke"  . Wears glasses      Allergies  Allergen Reactions  . Tape Itching    Redness, Please use "paper" tape only     Current Outpatient Medications  Medication Sig Dispense Refill  . acetaminophen (TYLENOL) 500 MG tablet Take 500-1,000 mg by mouth daily as needed for moderate pain.    Marland Kitchen ALPRAZolam (XANAX) 0.5 MG tablet Take 0.5 mg by mouth 4 (four) times daily as needed for anxiety or sleep (every night for sleep).     Marland Kitchen aspirin EC 81 MG tablet Take 81 mg by mouth daily.    . bisacodyl (DULCOLAX) 5 MG EC tablet Take 5 mg by mouth daily as needed for moderate constipation.    . cetirizine (ZYRTEC) 10 MG tablet Take 10 mg by mouth daily as needed for allergies.   11  . [facility]  MELATONIN 3 MG TABS Take 3 mg by mouth at bedtime.  11  . enoxaparin (LOVENOX) 80 MG/0.8ML injection Inject 0.8 mLs (80 mg total) into the skin every 12 (twelve) hours. 20 Syringe 0  . fluticasone (FLONASE) 50 MCG/ACT nasal spray Place 2 sprays into both nostrils daily.   11  . furosemide (LASIX) 40 MG tablet Take 40 mg by mouth daily.     Marland Kitchen gabapentin (NEURONTIN) 100 MG capsule Take 100 mg by mouth daily.  11  . HYDROcodone-acetaminophen (NORCO) 10-325 MG tablet Take 1 tablet by mouth as needed.   0  . lisinopril (PRINIVIL,ZESTRIL) 10 MG tablet TAKE 1 TABLET BY MOUTH EVERY DAY FOR HIGH BP AND HEART  3  . metoprolol (TOPROL-XL) 50 MG 24 hr tablet Take 50 mg by mouth daily.     . pantoprazole (PROTONIX) 40 MG tablet Take 1 tablet (40 mg total) by mouth daily before breakfast. 30 tablet 5  . simvastatin (ZOCOR) 40 MG tablet Take 40 mg by mouth daily at 6 PM.     . warfarin (COUMADIN) 2 MG tablet Take 1 1/2 tablets daily except 2 tablets on Mondays, Wednesdays and Fridays 150 tablet 3   No current facility-administered medications for this visit.      Past Surgical  History:  Procedure Laterality Date  . ANKLE SURGERY     Left tendon repair  . AORTIC VALVE REPLACEMENT  03/1994  . CARDIAC CATHETERIZATION     1996 [facility]  . CERVICAL SPINE SURGERY    . COLONOSCOPY    . HEMORRHOID SURGERY    . KNEE ARTHROSCOPY W/ MENISCECTOMY Right 04/13/2015  . LUMBAR FUSION    . RIGHT KNEE ARTHROSCOPY WITH DEBRIDEMENT AND PARTIAL MEDIAL MENISCECTOMY Right 04/13/2015   Performed by Mcarthur Rossetti, MD at [facility] OR     Allergies  Allergen Reactions  . Tape Itching    Redness, Please use "paper" tape only      Family History  Problem Relation Age of Onset  . Pneumonia Sister   . Dementia Mother      Social History Ms. Osburn reports that  has never smoked. she has never used smokeless tobacco. Ms. Conley reports that she does not drink alcohol.   Review of Systems CONSTITUTIONAL: No  weight loss, fever, chills, weakness or fatigue.  HEENT: Eyes: No visual loss, blurred vision, double vision or yellow sclerae.No hearing loss, sneezing, congestion, runny nose or sore throat.  SKIN: No rash or itching.  CARDIOVASCULAR: no chest pain, no palpitations.  RESPIRATORY: +SOB GASTROINTESTINAL: No anorexia, nausea, vomiting or diarrhea. No abdominal pain or blood.  GENITOURINARY: No burning on urination, no polyuria NEUROLOGICAL: No headache, dizziness, syncope, paralysis, ataxia, numbness or tingling in the extremities. No change in bowel or bladder control.  MUSCULOSKELETAL: No muscle, back pain, joint pain or stiffness.  LYMPHATICS: No enlarged nodes. No history of splenectomy.  PSYCHIATRIC: No history of depression or anxiety.  ENDOCRINOLOGIC: No reports of sweating, cold or heat intolerance. No polyuria or polydipsia.  Marland Kitchen   Physical Examination Vitals:   02/05/17 1421  BP: 126/64  Pulse: 73  SpO2: 93%   Vitals:   02/05/17 1421  Weight: 186 lb (84.4 kg)  Height: 5\' 6"  (1.676 m)    Gen: resting comfortably, no acute distress HEENT: no scleral icterus, pupils equal round and reactive, no palptable cervical adenopathy,  CV:RRR, no m/r/g, no jvd Resp: decreased breath sounds right base GI: abdomen is soft, non-tender, non-distended, normal bowel sounds, no hepatosplenomegaly MSK: extremities are warm, no edema.  Skin: warm, no rash Neuro:  no focal deficits Psych: appropriate affect   Diagnostic Studies 10/2015 echo: LVEF 55-60%, cannot evaluate diastolic function, normal mechanical AV,      Assessment and Plan   1.SOB/pleural effusions - no evidence this is CHF related. Her cardiac function is quite other than mild diastolic dysfunction. Unlikely CHF effusion would be unilateral. We also tried increase her lasix previously and effusion did not improve, but she developed AKI suggesting she was becoming hypovolemic. Discussed with Dr Karie Kirks who has full  pleural fluid results, looks to be exudative per his report - patient needs to f/u with pulmonary for further evaluation, at this time no further cardiac interventions or treatments are planned regarding this effusion. Patient is to follow up with Dr Luan Pulling.       Arnoldo Lenis, M.D.

## 2017-02-05 NOTE — Patient Instructions (Signed)
Medication Instructions:  Your physician recommends that you continue on your current medications as directed. Please refer to the Current Medication list given to you today.   Labwork: I WILL REQUEST LABS FROM PCP.   Testing/Procedures: NONE  Follow-Up: Your physician recommends that you schedule a follow-up appointment in: 4 MONTHS.    Any Other Special Instructions Will Be Listed Below (If Applicable).  PLEASE CALL DR. HAWKINS OFFICE TO SCHEDULE AN APPOINTMENT: [phone] OR [phone]  If you need a refill on your cardiac medications before your next appointment, please call your pharmacy.

## 2017-02-08 ENCOUNTER — Encounter: Payer: Self-pay | Admitting: Cardiology

## 2017-02-13 ENCOUNTER — Ambulatory Visit (INDEPENDENT_AMBULATORY_CARE_PROVIDER_SITE_OTHER): Payer: Medicare Other | Admitting: *Deleted

## 2017-02-13 ENCOUNTER — Other Ambulatory Visit (HOSPITAL_COMMUNITY): Payer: Self-pay | Admitting: Pulmonary Disease

## 2017-02-13 ENCOUNTER — Ambulatory Visit (HOSPITAL_COMMUNITY)
Admission: RE | Admit: 2017-02-13 | Discharge: 2017-02-13 | Disposition: A | Discharge status: Home / Self Care | Payer: Medicare Other | Source: Ambulatory Visit | Attending: Pulmonary Disease | Admitting: Pulmonary Disease

## 2017-02-13 DIAGNOSIS — I359 Nonrheumatic aortic valve disorder, unspecified: Secondary | ICD-10-CM

## 2017-02-13 DIAGNOSIS — I639 Cerebral infarction, unspecified: Secondary | ICD-10-CM | POA: Diagnosis not present

## 2017-02-13 DIAGNOSIS — Z952 Presence of prosthetic heart valve: Secondary | ICD-10-CM

## 2017-02-13 DIAGNOSIS — R918 Other nonspecific abnormal finding of lung field: Secondary | ICD-10-CM | POA: Insufficient documentation

## 2017-02-13 DIAGNOSIS — J90 Pleural effusion, not elsewhere classified: Secondary | ICD-10-CM | POA: Diagnosis present

## 2017-02-13 DIAGNOSIS — I700 Atherosclerosis of aorta: Secondary | ICD-10-CM | POA: Insufficient documentation

## 2017-02-13 LAB — POCT INR: INR: 2.9

## 2017-03-15 ENCOUNTER — Ambulatory Visit (INDEPENDENT_AMBULATORY_CARE_PROVIDER_SITE_OTHER): Payer: Medicare Other | Admitting: *Deleted

## 2017-03-15 DIAGNOSIS — Z952 Presence of prosthetic heart valve: Secondary | ICD-10-CM

## 2017-03-15 DIAGNOSIS — I359 Nonrheumatic aortic valve disorder, unspecified: Secondary | ICD-10-CM

## 2017-03-15 DIAGNOSIS — I639 Cerebral infarction, unspecified: Secondary | ICD-10-CM

## 2017-03-15 LAB — POCT INR: INR: 2.7

## 2017-03-15 NOTE — Patient Instructions (Signed)
Continue coumadin 1 1/2 tablets daily except 1 tablet on Sundays and Wednesdays Continue 1 to 2 servings of greens a week Recheck in 4 weeks

## 2017-03-16 ENCOUNTER — Ambulatory Visit (HOSPITAL_COMMUNITY)
Admission: RE | Admit: 2017-03-16 | Discharge: 2017-03-16 | Disposition: A | Discharge status: Home / Self Care | Payer: Medicare Other | Source: Ambulatory Visit | Attending: Pulmonary Disease | Admitting: Pulmonary Disease

## 2017-03-16 ENCOUNTER — Other Ambulatory Visit (HOSPITAL_COMMUNITY): Payer: Self-pay | Admitting: Pulmonary Disease

## 2017-03-16 DIAGNOSIS — R0602 Shortness of breath: Secondary | ICD-10-CM | POA: Insufficient documentation

## 2017-03-16 DIAGNOSIS — J90 Pleural effusion, not elsewhere classified: Secondary | ICD-10-CM | POA: Insufficient documentation

## 2017-03-16 DIAGNOSIS — R05 Cough: Secondary | ICD-10-CM | POA: Diagnosis not present

## 2017-03-16 DIAGNOSIS — Z951 Presence of aortocoronary bypass graft: Secondary | ICD-10-CM | POA: Insufficient documentation

## 2017-03-16 DIAGNOSIS — R059 Cough, unspecified: Secondary | ICD-10-CM

## 2017-03-16 DIAGNOSIS — I517 Cardiomegaly: Secondary | ICD-10-CM | POA: Insufficient documentation

## 2017-03-16 DIAGNOSIS — J9811 Atelectasis: Secondary | ICD-10-CM | POA: Diagnosis not present

## 2017-04-03 ENCOUNTER — Ambulatory Visit: Payer: Medicare Other | Admitting: Cardiology

## 2017-04-12 ENCOUNTER — Ambulatory Visit (INDEPENDENT_AMBULATORY_CARE_PROVIDER_SITE_OTHER): Payer: Medicare Other | Admitting: *Deleted

## 2017-04-12 DIAGNOSIS — Z952 Presence of prosthetic heart valve: Secondary | ICD-10-CM | POA: Diagnosis not present

## 2017-04-12 DIAGNOSIS — I359 Nonrheumatic aortic valve disorder, unspecified: Secondary | ICD-10-CM

## 2017-04-12 DIAGNOSIS — Z5181 Encounter for therapeutic drug level monitoring: Secondary | ICD-10-CM | POA: Diagnosis not present

## 2017-04-12 DIAGNOSIS — I639 Cerebral infarction, unspecified: Secondary | ICD-10-CM | POA: Diagnosis not present

## 2017-04-12 LAB — POCT INR: INR: 2.6

## 2017-04-12 NOTE — Patient Instructions (Signed)
Continue coumadin 1 1/2 tablets daily except 1 tablet on Sundays and Wednesdays Continue 1 to 2 servings of greens a week Recheck in 6 weeks

## 2017-04-20 ENCOUNTER — Telehealth: Payer: Self-pay | Admitting: *Deleted

## 2017-04-20 MED ORDER — WARFARIN SODIUM 2 MG PO TABS: ORAL_TABLET | ORAL | 3 refills | Status: DC

## 2017-04-20 NOTE — Telephone Encounter (Signed)
°*  STAT* If patient is at the pharmacy, call can be transferred to refill team.   1. Which medications need to be refilled? (please list name of each medication and dose if known)   warfarin (COUMADIN) 2 MG tablet [[id number]]    2. Which pharmacy/location (including street and city if local pharmacy) is medication to be sent to? [facility] [place]   3. Do they need a 30 day or 90 day supply?  Received phone call from Dr. Ria Comment office that this pt will not have enough Coumadin to last her through the weekend.

## 2017-04-20 NOTE — Telephone Encounter (Signed)
RX sent to [facility]

## 2017-05-24 ENCOUNTER — Ambulatory Visit (INDEPENDENT_AMBULATORY_CARE_PROVIDER_SITE_OTHER): Payer: Medicare Other | Admitting: *Deleted

## 2017-05-24 DIAGNOSIS — Z952 Presence of prosthetic heart valve: Secondary | ICD-10-CM | POA: Diagnosis not present

## 2017-05-24 DIAGNOSIS — I359 Nonrheumatic aortic valve disorder, unspecified: Secondary | ICD-10-CM | POA: Diagnosis not present

## 2017-05-24 DIAGNOSIS — I639 Cerebral infarction, unspecified: Secondary | ICD-10-CM | POA: Diagnosis not present

## 2017-05-24 LAB — POCT INR: INR: 3.4

## 2017-05-24 NOTE — Patient Instructions (Signed)
Continue coumadin 1 1/2 tablets daily except 1 tablet on Sundays and Wednesdays Continue 1 to 2 servings of greens a week Recheck in 6 weeks

## 2017-06-11 ENCOUNTER — Ambulatory Visit: Payer: Medicare Other | Admitting: Cardiology

## 2017-06-11 ENCOUNTER — Encounter: Payer: Self-pay | Admitting: Cardiology

## 2017-06-11 VITALS — BP 102/60 | HR 70 | Ht 67.0 in | Wt 188.0 lb

## 2017-06-11 DIAGNOSIS — Z952 Presence of prosthetic heart valve: Secondary | ICD-10-CM | POA: Diagnosis not present

## 2017-06-11 DIAGNOSIS — R0602 Shortness of breath: Secondary | ICD-10-CM | POA: Diagnosis not present

## 2017-06-11 DIAGNOSIS — R079 Chest pain, unspecified: Secondary | ICD-10-CM | POA: Diagnosis not present

## 2017-06-11 DIAGNOSIS — I10 Essential (primary) hypertension: Secondary | ICD-10-CM

## 2017-06-11 DIAGNOSIS — E782 Mixed hyperlipidemia: Secondary | ICD-10-CM

## 2017-06-11 NOTE — Patient Instructions (Addendum)
Your physician wants you to follow-up in: 6 months with Dr.Branch  You will receive a reminder letter in the mail two months in advance. If you don't receive a letter, please call our office to schedule the follow-up appointment.    Your physician has requested that you have a lexiscan myoview. For further information please visit [email or web address]. Please follow instruction sheet, as given.     Your physician recommends that you continue on your current medications as directed. Please refer to the Current Medication list given to you today.   If you need a refill on your cardiac medications before your next appointment, please call your pharmacy.   No lab work ordered today     Thank you for choosing [facility] !

## 2017-06-11 NOTE — Progress Notes (Signed)
Clinical Summary Mary [name] is a 77 y.o.female seen today for follow up of the following medical problems.    1. Pleural effusion/SOB - followed by Dr Luan Pulling - we tried increasing her lasix to 60mg  daily back in 06/2016  however she developed AKI(Cr 0.8 to 1.28) last Cr down to 1.  , and dose was decreased back to 40mg  daily.  01/16/17 large right pleural effusion  - thoracentesis 01/25/17 with 1.2 liters removed. Cultures negative, gram stain +PMNs, pH 7.6, cytology reactive mesothelial cells - pleural LDH 112 - pleural protein: 3.6 - I do not have the serum LDH and totral protein numbers available to me -Repeat CXR 02/02/17 showed reaccumulating effusion - 10/2015 echo LVEF 55-60%, normal AVR   - has follow up with pcp coming up.      2. 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  - Chronic right leg swelling, no new edema.  - can have some leg cramps at times. .   3. HTN - compliant with meds   4. Hyperlipidemia - she is compliant with statin  5. OSA - noncompliant with cpap  6. History of CVA - followed by pcp   7. Chest pain - aching pain across entire chest. Can be 10/10 in severity. Better with position. No other associated symptoms. Lasted 1-2 hours.        Past Medical History:  Diagnosis Date  . Anxiety   . Diabetes mellitus with neuropathy ([place])   . Diabetic neuropathy ([place])   . DJD (degenerative joint disease)   . H/O aortic valve replacement   . Hyperlipidemia   . Hypertension   . Neuromuscular disorder ([facility])    neuropathy in feet  . Obesity   . Sleep apnea   . Stroke [facility])    " light stroke"  . Wears glasses      Allergies  Allergen Reactions  . Tape Itching    Redness, Please use "paper" tape only     Current Outpatient Medications  Medication Sig Dispense Refill  . acetaminophen (TYLENOL) 500 MG  tablet Take 500-1,000 mg by mouth daily as needed for moderate pain.    Marland Kitchen ALPRAZolam (XANAX) 0.5 MG tablet Take 0.5 mg by mouth 4 (four) times daily as needed for anxiety or sleep (every night for sleep).     . bisacodyl (DULCOLAX) 5 MG EC tablet Take 5 mg by mouth daily as needed for moderate constipation.    . cetirizine (ZYRTEC) 10 MG tablet Take 10 mg by mouth daily as needed for allergies.   11  . [facility] MELATONIN 3 MG TABS Take 3 mg by mouth at bedtime.  11  . fluticasone (FLONASE) 50 MCG/ACT nasal spray Place 2 sprays into both nostrils daily.   11  . furosemide (LASIX) 40 MG tablet Take 40 mg by mouth daily.     Marland Kitchen gabapentin (NEURONTIN) 100 MG capsule Take 100 mg by mouth daily.  11  . HYDROcodone-acetaminophen (NORCO) 10-325 MG tablet Take 1 tablet by mouth as needed.   0  . lisinopril (PRINIVIL,ZESTRIL) 10 MG tablet TAKE 1 TABLET BY MOUTH EVERY DAY FOR HIGH BP AND HEART  3  . metoprolol (TOPROL-XL) 50 MG 24 hr tablet Take 50 mg by mouth daily.     . pantoprazole (PROTONIX) 40 MG tablet Take 1 tablet (40 mg total) by mouth daily  before breakfast. 30 tablet 5  . simvastatin (ZOCOR) 40 MG tablet Take 40 mg by mouth daily at 6 PM.     . warfarin (COUMADIN) 2 MG tablet Take 1 1/2 tablets daily except 2 tablets on Mondays, Wednesdays and Fridays 135 tablet 3   No current facility-administered medications for this visit.      Past Surgical History:  Procedure Laterality Date  . ANKLE SURGERY     Left tendon repair  . AORTIC VALVE REPLACEMENT  03/1994  . CARDIAC CATHETERIZATION     1996 [facility]  . CERVICAL SPINE SURGERY    . COLONOSCOPY    . HEMORRHOID SURGERY    . KNEE ARTHROSCOPY Right 04/13/2015   Procedure: RIGHT KNEE ARTHROSCOPY WITH DEBRIDEMENT AND PARTIAL MEDIAL MENISCECTOMY;  Surgeon: Mcarthur Rossetti, MD;  Location: [place];  Service: Orthopedics;  Laterality: Right;  . KNEE ARTHROSCOPY W/ MENISCECTOMY Right 04/13/2015  . LUMBAR FUSION       Allergies  Allergen  Reactions  . Tape Itching    Redness, Please use "paper" tape only      Family History  Problem Relation Age of Onset  . Pneumonia Sister   . Dementia Mother      Social History Mary [name] reports that she has never smoked. She has never used smokeless tobacco. Mary [name] reports that she does not drink alcohol.   Review of Systems CONSTITUTIONAL: No weight loss, fever, chills, weakness or fatigue.  HEENT: Eyes: No visual loss, blurred vision, double vision or yellow sclerae.No hearing loss, sneezing, congestion, runny nose or sore throat.  SKIN: No rash or itching.  CARDIOVASCULAR: per hpi RESPIRATORY: per hpi GASTROINTESTINAL: No anorexia, nausea, vomiting or diarrhea. No abdominal pain or blood.  GENITOURINARY: No burning on urination, no polyuria NEUROLOGICAL: No headache, dizziness, syncope, paralysis, ataxia, numbness or tingling in the extremities. No change in bowel or bladder control.  MUSCULOSKELETAL: leg cramps LYMPHATICS: No enlarged nodes. No history of splenectomy.  PSYCHIATRIC: No history of depression or anxiety.  ENDOCRINOLOGIC: No reports of sweating, cold or heat intolerance. No polyuria or polydipsia.  Marland Kitchen   Physical Examination Vitals:   06/11/17 1312  BP: 102/60  Pulse: 70  SpO2: 94%   Vitals:   06/11/17 1312  Weight: 188 lb (85.3 kg)  Height: 5\' 7"  (1.702 m)    Gen: resting comfortably, no acute distress HEENT: no scleral icterus, pupils equal round and reactive, no palptable cervical adenopathy,  CV: RRR, no m/r/g, no jvd Resp: mild decreased BS RLLL GI: abdomen is soft, non-tender, non-distended, normal bowel sounds, no hepatosplenomegaly MSK: extremities are warm, no edema.  Skin: warm, no rash Neuro:  no focal deficits Psych: appropriate affect   Diagnostic Studies     Assessment and Plan  1.SOB/pleural effusions - no evidence this is CHF related. Her cardiac function is quite normal other than mild diastolic dysfunction.  Unlikely CHF effusion would be unilateral. We also tried increasing her lasix previously and effusion did not improve, but she developed AKI suggesting she was becoming hypovolemic. Discussed with Dr Karie Kirks who has full pleural fluid results, looks to be exudative per his report  - continue to f/u with pcp and pulmonary   2.Aortic valve replacement - metal valve placed in 1995/1996, normal function by last echo 10/2015 - she will continue current meds  3. HTN - at goal, continue current meds  4. Hyperlipidemia - continue current meds  5. Chest pain -due to recent symptoms we will obtain a  lexiscan to evaluate for possible ischemia as the etiology   F/u 6 months   Arnoldo Lenis, M.D.

## 2017-06-15 ENCOUNTER — Encounter: Payer: Self-pay | Admitting: Cardiology

## 2017-07-05 ENCOUNTER — Ambulatory Visit (INDEPENDENT_AMBULATORY_CARE_PROVIDER_SITE_OTHER): Payer: Medicare Other | Admitting: *Deleted

## 2017-07-05 DIAGNOSIS — I639 Cerebral infarction, unspecified: Secondary | ICD-10-CM

## 2017-07-05 DIAGNOSIS — Z952 Presence of prosthetic heart valve: Secondary | ICD-10-CM

## 2017-07-05 DIAGNOSIS — I359 Nonrheumatic aortic valve disorder, unspecified: Secondary | ICD-10-CM | POA: Diagnosis not present

## 2017-07-05 LAB — POCT INR: INR: 3

## 2017-07-05 NOTE — Patient Instructions (Signed)
Continue coumadin 1 1/2 tablets daily except 1 tablet on Sundays and Wednesdays Continue 1 to 2 servings of greens a week Recheck in 6 weeks

## 2017-07-18 ENCOUNTER — Other Ambulatory Visit (HOSPITAL_COMMUNITY): Payer: Self-pay | Admitting: Pulmonary Disease

## 2017-07-18 ENCOUNTER — Ambulatory Visit (HOSPITAL_COMMUNITY)
Admission: RE | Admit: 2017-07-18 | Discharge: 2017-07-18 | Disposition: A | Discharge status: Home / Self Care | Payer: Medicare Other | Source: Ambulatory Visit | Attending: Pulmonary Disease | Admitting: Pulmonary Disease

## 2017-07-18 DIAGNOSIS — J90 Pleural effusion, not elsewhere classified: Secondary | ICD-10-CM | POA: Insufficient documentation

## 2017-07-19 ENCOUNTER — Encounter (HOSPITAL_BASED_OUTPATIENT_CLINIC_OR_DEPARTMENT_OTHER)
Admission: RE | Admit: 2017-07-19 | Discharge: 2017-07-19 | Disposition: A | Discharge status: Home / Self Care | Payer: Medicare Other | Source: Ambulatory Visit | Attending: Cardiology | Admitting: Cardiology

## 2017-07-19 ENCOUNTER — Encounter (HOSPITAL_COMMUNITY)
Admission: RE | Admit: 2017-07-19 | Discharge: 2017-07-19 | Disposition: A | Discharge status: Home / Self Care | Payer: Medicare Other | Source: Ambulatory Visit | Attending: Cardiology | Admitting: Cardiology

## 2017-07-19 ENCOUNTER — Encounter (HOSPITAL_COMMUNITY): Payer: Self-pay

## 2017-07-19 DIAGNOSIS — R079 Chest pain, unspecified: Secondary | ICD-10-CM | POA: Insufficient documentation

## 2017-07-19 LAB — NM MYOCAR MULTI W/SPECT W/WALL MOTION / EF
CHL CUP RESTING HR STRESS: 56 {beats}/min
LV dias vol: 80 mL (ref 46–106)
LVSYSVOL: 29 mL
Peak HR: 84 {beats}/min
RATE: 0.43
SDS: 2
SRS: 0
SSS: 2
TID: 1.06

## 2017-07-19 MED ORDER — TECHNETIUM TC 99M TETROFOSMIN IV KIT
30.0000 | PACK | Freq: Once | INTRAVENOUS | Status: AC | PRN
  Administered 2017-07-19: 30 via INTRAVENOUS

## 2017-07-19 MED ORDER — SODIUM CHLORIDE 0.9% FLUSH
INTRAVENOUS | Status: AC
  Administered 2017-07-19: 10 mL via INTRAVENOUS
  Filled 2017-07-19: qty 10

## 2017-07-19 MED ORDER — TECHNETIUM TC 99M TETROFOSMIN IV KIT
10.0000 | PACK | Freq: Once | INTRAVENOUS | Status: AC | PRN
  Administered 2017-07-19: 11 via INTRAVENOUS

## 2017-07-19 MED ORDER — REGADENOSON 0.4 MG/5ML IV SOLN
INTRAVENOUS | Status: AC
  Administered 2017-07-19: 0.4 mg via INTRAVENOUS
  Filled 2017-07-19: qty 5

## 2017-08-16 ENCOUNTER — Ambulatory Visit: Payer: Medicare Other | Admitting: *Deleted

## 2017-08-16 DIAGNOSIS — Z5181 Encounter for therapeutic drug level monitoring: Secondary | ICD-10-CM

## 2017-08-16 DIAGNOSIS — Z952 Presence of prosthetic heart valve: Secondary | ICD-10-CM

## 2017-08-16 DIAGNOSIS — I359 Nonrheumatic aortic valve disorder, unspecified: Secondary | ICD-10-CM

## 2017-08-16 DIAGNOSIS — I639 Cerebral infarction, unspecified: Secondary | ICD-10-CM | POA: Diagnosis not present

## 2017-08-16 LAB — POCT INR: INR: 3.5 — AB (ref 2.0–3.0)

## 2017-08-16 NOTE — Patient Instructions (Signed)
Continue coumadin 1 1/2 tablets daily except 1 tablet on Sundays and Wednesdays Continue 1 to 2 servings of greens a week Recheck in 6 weeks

## 2017-08-22 ENCOUNTER — Ambulatory Visit (HOSPITAL_COMMUNITY)
Admission: RE | Admit: 2017-08-22 | Discharge: 2017-08-22 | Disposition: A | Discharge status: Home / Self Care | Payer: Medicare Other | Source: Ambulatory Visit | Attending: Pulmonary Disease | Admitting: Pulmonary Disease

## 2017-08-22 ENCOUNTER — Other Ambulatory Visit (HOSPITAL_COMMUNITY): Payer: Self-pay | Admitting: Pulmonary Disease

## 2017-08-22 ENCOUNTER — Other Ambulatory Visit (HOSPITAL_COMMUNITY)
Admission: RE | Admit: 2017-08-22 | Discharge: 2017-08-22 | Disposition: A | Discharge status: Home / Self Care | Payer: Medicare Other | Source: Ambulatory Visit | Attending: Pulmonary Disease | Admitting: Pulmonary Disease

## 2017-08-22 DIAGNOSIS — J90 Pleural effusion, not elsewhere classified: Secondary | ICD-10-CM | POA: Diagnosis present

## 2017-08-22 DIAGNOSIS — Z952 Presence of prosthetic heart valve: Secondary | ICD-10-CM | POA: Insufficient documentation

## 2017-08-22 DIAGNOSIS — R918 Other nonspecific abnormal finding of lung field: Secondary | ICD-10-CM | POA: Diagnosis not present

## 2017-08-22 DIAGNOSIS — Z7901 Long term (current) use of anticoagulants: Secondary | ICD-10-CM | POA: Diagnosis present

## 2017-08-22 DIAGNOSIS — I10 Essential (primary) hypertension: Secondary | ICD-10-CM | POA: Diagnosis present

## 2017-08-22 LAB — BASIC METABOLIC PANEL
Anion gap: 10 (ref 5–15)
BUN: 24 mg/dL — AB (ref 6–20)
CHLORIDE: 99 mmol/L — AB (ref 101–111)
CO2: 31 mmol/L (ref 22–32)
Calcium: 9.3 mg/dL (ref 8.9–10.3)
Creatinine, Ser: 1.28 mg/dL — ABNORMAL HIGH (ref 0.44–1.00)
GFR calc Af Amer: 45 mL/min — ABNORMAL LOW (ref >60)
GFR calc non Af Amer: 39 mL/min — ABNORMAL LOW (ref >60)
GLUCOSE: 137 mg/dL — AB (ref 65–99)
POTASSIUM: 3.9 mmol/L (ref 3.5–5.1)
SODIUM: 140 mmol/L (ref 135–145)

## 2017-09-27 ENCOUNTER — Ambulatory Visit (INDEPENDENT_AMBULATORY_CARE_PROVIDER_SITE_OTHER): Payer: Medicare Other | Admitting: *Deleted

## 2017-09-27 DIAGNOSIS — Z5181 Encounter for therapeutic drug level monitoring: Secondary | ICD-10-CM | POA: Diagnosis not present

## 2017-09-27 DIAGNOSIS — Z952 Presence of prosthetic heart valve: Secondary | ICD-10-CM | POA: Diagnosis not present

## 2017-09-27 DIAGNOSIS — I639 Cerebral infarction, unspecified: Secondary | ICD-10-CM

## 2017-09-27 DIAGNOSIS — I359 Nonrheumatic aortic valve disorder, unspecified: Secondary | ICD-10-CM | POA: Diagnosis not present

## 2017-09-27 LAB — POCT INR: INR: 4.1 — AB (ref 2.0–3.0)

## 2017-09-27 NOTE — Patient Instructions (Signed)
Hold coumadin tonight then decrease dose to 1 tablet daily except 1 1/2 tablets on Tuesdays, Thursdays and Saturdays Continue 1 to 2 servings of greens a week Recheck in 3 weeks

## 2017-10-03 ENCOUNTER — Ambulatory Visit (HOSPITAL_COMMUNITY)
Admission: RE | Admit: 2017-10-03 | Discharge: 2017-10-03 | Disposition: A | Discharge status: Home / Self Care | Payer: Medicare Other | Source: Ambulatory Visit | Attending: Family Medicine | Admitting: Family Medicine

## 2017-10-03 ENCOUNTER — Other Ambulatory Visit (HOSPITAL_COMMUNITY): Payer: Self-pay | Admitting: Family Medicine

## 2017-10-03 DIAGNOSIS — J189 Pneumonia, unspecified organism: Secondary | ICD-10-CM

## 2017-10-03 DIAGNOSIS — J9811 Atelectasis: Secondary | ICD-10-CM | POA: Insufficient documentation

## 2017-10-03 DIAGNOSIS — J90 Pleural effusion, not elsewhere classified: Secondary | ICD-10-CM | POA: Insufficient documentation

## 2017-10-03 DIAGNOSIS — J918 Pleural effusion in other conditions classified elsewhere: Secondary | ICD-10-CM

## 2017-10-18 ENCOUNTER — Ambulatory Visit (INDEPENDENT_AMBULATORY_CARE_PROVIDER_SITE_OTHER): Payer: Medicare Other | Admitting: *Deleted

## 2017-10-18 DIAGNOSIS — Z952 Presence of prosthetic heart valve: Secondary | ICD-10-CM | POA: Diagnosis not present

## 2017-10-18 DIAGNOSIS — Z5181 Encounter for therapeutic drug level monitoring: Secondary | ICD-10-CM

## 2017-10-18 DIAGNOSIS — I359 Nonrheumatic aortic valve disorder, unspecified: Secondary | ICD-10-CM | POA: Diagnosis not present

## 2017-10-18 DIAGNOSIS — I639 Cerebral infarction, unspecified: Secondary | ICD-10-CM

## 2017-10-18 LAB — POCT INR: INR: 4 — AB (ref 2.0–3.0)

## 2017-10-18 NOTE — Patient Instructions (Signed)
Hold coumadin tonight then decrease dose to 1 tablet daily except 1 1/2 tablets on Tuesdays Continue 1 to 2 servings of greens a week Recheck in 3 weeks

## 2017-11-08 ENCOUNTER — Ambulatory Visit: Payer: Medicare Other | Admitting: *Deleted

## 2017-11-08 DIAGNOSIS — Z952 Presence of prosthetic heart valve: Secondary | ICD-10-CM | POA: Diagnosis not present

## 2017-11-08 DIAGNOSIS — I359 Nonrheumatic aortic valve disorder, unspecified: Secondary | ICD-10-CM

## 2017-11-08 DIAGNOSIS — I639 Cerebral infarction, unspecified: Secondary | ICD-10-CM

## 2017-11-08 LAB — POCT INR: INR: 3.1 — AB (ref 2.0–3.0)

## 2017-11-08 NOTE — Patient Instructions (Signed)
Description   Continue taking  1 tablet daily except 1 1/2 tablets on Tuesdays.  Continue 1 to 2 servings of greens a week Recheck in 3 weeks

## 2017-12-04 ENCOUNTER — Ambulatory Visit (INDEPENDENT_AMBULATORY_CARE_PROVIDER_SITE_OTHER): Payer: Medicare Other | Admitting: *Deleted

## 2017-12-04 DIAGNOSIS — I639 Cerebral infarction, unspecified: Secondary | ICD-10-CM | POA: Diagnosis not present

## 2017-12-04 DIAGNOSIS — Z952 Presence of prosthetic heart valve: Secondary | ICD-10-CM | POA: Diagnosis not present

## 2017-12-04 DIAGNOSIS — I359 Nonrheumatic aortic valve disorder, unspecified: Secondary | ICD-10-CM | POA: Diagnosis not present

## 2017-12-04 DIAGNOSIS — Z5181 Encounter for therapeutic drug level monitoring: Secondary | ICD-10-CM | POA: Diagnosis not present

## 2017-12-04 LAB — POCT INR: INR: 3 (ref 2.0–3.0)

## 2017-12-04 NOTE — Patient Instructions (Signed)
Continue taking 1 tablet daily except 1 1/2 tablets on Tuesdays.  Continue 1 to 2 servings of greens a week Recheck in 4 weeks

## 2018-01-01 ENCOUNTER — Ambulatory Visit (INDEPENDENT_AMBULATORY_CARE_PROVIDER_SITE_OTHER): Payer: Medicare Other | Admitting: *Deleted

## 2018-01-01 DIAGNOSIS — I639 Cerebral infarction, unspecified: Secondary | ICD-10-CM

## 2018-01-01 DIAGNOSIS — Z952 Presence of prosthetic heart valve: Secondary | ICD-10-CM

## 2018-01-01 DIAGNOSIS — I359 Nonrheumatic aortic valve disorder, unspecified: Secondary | ICD-10-CM | POA: Diagnosis not present

## 2018-01-01 LAB — POCT INR: INR: 2.9 (ref 2.0–3.0)

## 2018-01-01 NOTE — Patient Instructions (Signed)
Continue taking 1 tablet daily except 1 1/2 tablets on Tuesdays.  Continue 1 to 2 servings of greens a week Recheck in 6 weeks

## 2018-01-02 ENCOUNTER — Ambulatory Visit (HOSPITAL_COMMUNITY)
Admission: RE | Admit: 2018-01-02 | Discharge: 2018-01-02 | Disposition: A | Discharge status: Home / Self Care | Payer: Medicare Other | Source: Ambulatory Visit | Attending: Pulmonary Disease | Admitting: Pulmonary Disease

## 2018-01-02 ENCOUNTER — Other Ambulatory Visit (HOSPITAL_COMMUNITY): Payer: Self-pay | Admitting: Pulmonary Disease

## 2018-01-02 DIAGNOSIS — J90 Pleural effusion, not elsewhere classified: Secondary | ICD-10-CM

## 2018-01-02 DIAGNOSIS — J9811 Atelectasis: Secondary | ICD-10-CM | POA: Insufficient documentation

## 2018-02-12 ENCOUNTER — Ambulatory Visit (INDEPENDENT_AMBULATORY_CARE_PROVIDER_SITE_OTHER): Payer: Medicare Other | Admitting: *Deleted

## 2018-02-12 DIAGNOSIS — I359 Nonrheumatic aortic valve disorder, unspecified: Secondary | ICD-10-CM

## 2018-02-12 DIAGNOSIS — Z5181 Encounter for therapeutic drug level monitoring: Secondary | ICD-10-CM

## 2018-02-12 DIAGNOSIS — I639 Cerebral infarction, unspecified: Secondary | ICD-10-CM

## 2018-02-12 DIAGNOSIS — Z952 Presence of prosthetic heart valve: Secondary | ICD-10-CM

## 2018-02-12 LAB — POCT INR: INR: 2.8 (ref 2.0–3.0)

## 2018-02-12 NOTE — Patient Instructions (Signed)
Continue taking 1 tablet daily except 1 1/2 tablets on Tuesdays.  Continue 1 to 2 servings of greens a week Recheck in 6 weeks

## 2018-03-22 ENCOUNTER — Ambulatory Visit (HOSPITAL_COMMUNITY)
Admission: RE | Admit: 2018-03-22 | Discharge: 2018-03-22 | Disposition: A | Discharge status: Home / Self Care | Payer: Medicare Other | Source: Ambulatory Visit | Attending: Pulmonary Disease | Admitting: Pulmonary Disease

## 2018-03-22 ENCOUNTER — Other Ambulatory Visit (HOSPITAL_COMMUNITY): Payer: Self-pay | Admitting: Pulmonary Disease

## 2018-03-22 DIAGNOSIS — J90 Pleural effusion, not elsewhere classified: Secondary | ICD-10-CM | POA: Diagnosis present

## 2018-04-03 ENCOUNTER — Encounter: Payer: Self-pay | Admitting: Cardiology

## 2018-04-03 ENCOUNTER — Ambulatory Visit: Payer: Medicare Other | Admitting: Cardiology

## 2018-04-03 VITALS — BP 118/67 | HR 65 | Ht 67.0 in | Wt 181.2 lb

## 2018-04-03 DIAGNOSIS — I359 Nonrheumatic aortic valve disorder, unspecified: Secondary | ICD-10-CM

## 2018-04-03 DIAGNOSIS — R002 Palpitations: Secondary | ICD-10-CM

## 2018-04-03 DIAGNOSIS — I10 Essential (primary) hypertension: Secondary | ICD-10-CM

## 2018-04-03 DIAGNOSIS — E782 Mixed hyperlipidemia: Secondary | ICD-10-CM

## 2018-04-03 DIAGNOSIS — Z952 Presence of prosthetic heart valve: Secondary | ICD-10-CM

## 2018-04-03 NOTE — Patient Instructions (Signed)
Your physician recommends that you schedule a follow-up appointment in: [place] has recommended you make the following change in your medication:   CHANGE TORSEMIDE 60 MG DAILY FOR THE NEXT 3 DAYS THEN RESUME 41 MG DAILY   Your physician has recommended that you wear an event monitor FOR 7 DAYS  Event monitors are medical devices that record the heart's electrical activity. Doctors most often [place] these monitors to diagnose arrhythmias. Arrhythmias are problems with the speed or rhythm of the heartbeat. The monitor is a small, portable device. You can wear one while you do your normal daily activities. This is usually used to diagnose what is causing palpitations/syncope (passing out).  Thank you for choosing [facility]!!
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
## 2018-04-11 ENCOUNTER — Ambulatory Visit (INDEPENDENT_AMBULATORY_CARE_PROVIDER_SITE_OTHER): Payer: Medicare Other | Admitting: Pharmacist

## 2018-04-11 DIAGNOSIS — I639 Cerebral infarction, unspecified: Secondary | ICD-10-CM | POA: Diagnosis not present

## 2018-04-11 DIAGNOSIS — Z952 Presence of prosthetic heart valve: Secondary | ICD-10-CM

## 2018-04-11 DIAGNOSIS — I359 Nonrheumatic aortic valve disorder, unspecified: Secondary | ICD-10-CM

## 2018-04-11 LAB — POCT INR: INR: 2.8 (ref 2.0–3.0)

## 2018-04-11 NOTE — Patient Instructions (Signed)
Description   Continue taking 1 tablet daily   Continue 1 to 2 servings of greens a week Recheck in 6 weeks

## 2018-04-24 ENCOUNTER — Telehealth: Payer: Self-pay | Admitting: *Deleted

## 2018-04-24 NOTE — Telephone Encounter (Signed)
-----   Message from Arnoldo Lenis, MD sent at 04/24/2018  1:51 PM EST ----- Heart monitor shows some episodes of an abnormal rhythm called afib, we need to discuss some possible medicatoin changes. Can she see me tomorrow at [place] [facility]

## 2018-04-24 NOTE — Telephone Encounter (Signed)
Pt voiced understanding - added to 940 schedule tomorrow - routed to pcp

## 2018-04-25 ENCOUNTER — Encounter: Payer: Self-pay | Admitting: Cardiology

## 2018-04-25 ENCOUNTER — Ambulatory Visit: Payer: Medicare Other | Admitting: Cardiology

## 2018-04-25 VITALS — BP 106/62 | HR 66 | Ht 66.0 in | Wt 180.6 lb

## 2018-04-25 DIAGNOSIS — I4891 Unspecified atrial fibrillation: Secondary | ICD-10-CM

## 2018-04-25 MED ORDER — METOPROLOL SUCCINATE ER 50 MG PO TB24: ORAL_TABLET | ORAL | 2 refills | Status: DC

## 2018-04-25 MED ORDER — LOSARTAN POTASSIUM 25 MG PO TABS: 25.0000 mg | ORAL_TABLET | Freq: Every day | ORAL | 3 refills | Status: DC
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
## 2018-04-25 NOTE — Patient Instructions (Addendum)
Medication Instructions:   Your physician has recommended you make the following change in your medication:   Increase toprol xl to 75 mg by mouth daily (1.5 tablets of your 50 mg)  Decrease losartan to 25 mg by mouth daily. You may break your 50 mg in half daily until they are finished.  Continue all other medications the same  Labwork:  NONE  Testing/Procedures:  NONE  Follow-Up:  Your physician recommends that you schedule a follow-up appointment in: as planned for 06/06/2018.  Any Other Special Instructions Will Be Listed Below (If Applicable).  If you need a refill on your cardiac medications before your next appointment, please call your pharmacy.

## 2018-05-16 ENCOUNTER — Other Ambulatory Visit: Payer: Self-pay | Admitting: Cardiology

## 2018-05-20 NOTE — Telephone Encounter (Signed)
rx refill sent

## 2018-06-06 ENCOUNTER — Other Ambulatory Visit: Payer: Self-pay

## 2018-06-06 ENCOUNTER — Ambulatory Visit: Payer: Medicare Other | Admitting: Cardiology

## 2018-06-06 ENCOUNTER — Ambulatory Visit (INDEPENDENT_AMBULATORY_CARE_PROVIDER_SITE_OTHER): Payer: Medicare Other | Admitting: *Deleted

## 2018-06-06 DIAGNOSIS — Z5181 Encounter for therapeutic drug level monitoring: Secondary | ICD-10-CM

## 2018-06-06 DIAGNOSIS — I359 Nonrheumatic aortic valve disorder, unspecified: Secondary | ICD-10-CM | POA: Diagnosis not present

## 2018-06-06 DIAGNOSIS — I639 Cerebral infarction, unspecified: Secondary | ICD-10-CM

## 2018-06-06 DIAGNOSIS — Z952 Presence of prosthetic heart valve: Secondary | ICD-10-CM

## 2018-06-06 LAB — POCT INR: INR: 3.6 — AB (ref 2.0–3.0)

## 2018-06-06 NOTE — Patient Instructions (Signed)
Take 1/2 tablet tonight then resume 1 tablet daily   Continue 1 to 2 servings of greens a week Recheck in 6 weeks

## 2018-08-05 ENCOUNTER — Telehealth: Payer: Self-pay | Admitting: *Deleted

## 2018-08-05 NOTE — Telephone Encounter (Signed)
The patient verbally consented for a telehealth phone visit with [facility] and understands that his/her insurance company will be billed for the encounter.  She will have vitals, weight, & medications ready.  

## 2018-08-06 ENCOUNTER — Telehealth (INDEPENDENT_AMBULATORY_CARE_PROVIDER_SITE_OTHER): Payer: Medicare Other | Admitting: Cardiology

## 2018-08-06 ENCOUNTER — Encounter: Payer: Self-pay | Admitting: Cardiology

## 2018-08-06 VITALS — BP 102/56 | Ht 67.0 in | Wt 160.0 lb

## 2018-08-06 DIAGNOSIS — I4891 Unspecified atrial fibrillation: Secondary | ICD-10-CM | POA: Diagnosis not present

## 2018-08-06 DIAGNOSIS — R42 Dizziness and giddiness: Secondary | ICD-10-CM

## 2018-08-06 MED ORDER — LOSARTAN POTASSIUM 25 MG PO TABS: 12.5000 mg | ORAL_TABLET | Freq: Every day | ORAL | 1 refills | Status: DC

## 2018-08-06 MED ORDER — TORSEMIDE 20 MG PO TABS: 20.0000 mg | ORAL_TABLET | Freq: Every day | ORAL | 3 refills | Status: DC

## 2018-08-06 MED ORDER — METOPROLOL SUCCINATE ER 100 MG PO TB24: 100.0000 mg | ORAL_TABLET | Freq: Every day | ORAL | 1 refills | Status: DC
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
## 2018-08-06 NOTE — Patient Instructions (Addendum)
Your physician recommends that you schedule a follow-up appointment in: 2 [place] has recommended you make the following change in your medication:   INCREASER TOPROL XL 100 MG DAILY   LOSARTAN 12.5 MG (1/2 TABLET) DAILY   TORSEMIDE 20 MG DAILY   Thank you for choosing [facility]!!

## 2018-08-06 NOTE — Addendum Note (Signed)
Addended by: Julian Hy T on: 08/06/2018 04:17 PM   Modules accepted: Orders

## 2018-08-06 NOTE — Addendum Note (Signed)
Addended by: Julian Hy T on: 08/06/2018 04:33 PM   Modules accepted: Orders

## 2018-08-26 ENCOUNTER — Telehealth: Payer: Self-pay | Admitting: *Deleted

## 2018-08-26 NOTE — Telephone Encounter (Signed)
° ° °  COVID-19 Pre-Screening Questions:   In the past 7 to 10 days have you had a cough,  shortness of breath, headache, congestion, fever (100 or greater) body aches, chills, sore throat, or sudden loss of taste or sense of smell? No  Have you been around anyone with known Covid 19.no    Have you been around anyone who is awaiting Covid 19 test results in the past 7 to 10 days? No  Have you been around anyone who has been exposed to Covid 19, or has mentioned symptoms of Covid 19 within the past 7 to 10 days?  No    If you have any concerns/questions about symptoms patients report during screening (either on the phone or at [facility]). Contact the provider seeing the patient or DOD for further guidance.  If neither are available contact a member of the leadership team.

## 2018-08-27 ENCOUNTER — Ambulatory Visit (INDEPENDENT_AMBULATORY_CARE_PROVIDER_SITE_OTHER): Payer: Medicare Other | Admitting: *Deleted

## 2018-08-27 DIAGNOSIS — Z5181 Encounter for therapeutic drug level monitoring: Secondary | ICD-10-CM | POA: Diagnosis not present

## 2018-08-27 DIAGNOSIS — I639 Cerebral infarction, unspecified: Secondary | ICD-10-CM

## 2018-08-27 DIAGNOSIS — I359 Nonrheumatic aortic valve disorder, unspecified: Secondary | ICD-10-CM | POA: Diagnosis not present

## 2018-08-27 DIAGNOSIS — Z952 Presence of prosthetic heart valve: Secondary | ICD-10-CM | POA: Diagnosis not present

## 2018-08-27 LAB — POCT INR: INR: 2 (ref 2.0–3.0)

## 2018-08-27 NOTE — Patient Instructions (Signed)
Take 1 1/2 tablets tonight and tomorrow night then resume 1 tablet daily   Continue 1 to 2 servings of greens a week Recheck in 6 weeks

## 2018-09-09 ENCOUNTER — Ambulatory Visit (HOSPITAL_COMMUNITY)
Admission: RE | Admit: 2018-09-09 | Discharge: 2018-09-09 | Disposition: A | Discharge status: Home / Self Care | Payer: Medicare Other | Source: Ambulatory Visit | Attending: Pulmonary Disease | Admitting: Pulmonary Disease

## 2018-09-09 ENCOUNTER — Other Ambulatory Visit (HOSPITAL_COMMUNITY): Payer: Self-pay | Admitting: Pulmonary Disease

## 2018-09-09 ENCOUNTER — Other Ambulatory Visit: Payer: Self-pay

## 2018-09-09 DIAGNOSIS — J90 Pleural effusion, not elsewhere classified: Secondary | ICD-10-CM | POA: Diagnosis not present

## 2018-09-17 ENCOUNTER — Other Ambulatory Visit: Payer: Self-pay | Admitting: Cardiology

## 2018-10-04 ENCOUNTER — Other Ambulatory Visit: Payer: Medicare Other

## 2018-10-04 ENCOUNTER — Encounter: Payer: Self-pay | Admitting: Cardiology

## 2018-10-04 ENCOUNTER — Other Ambulatory Visit: Payer: Self-pay

## 2018-10-04 ENCOUNTER — Telehealth (INDEPENDENT_AMBULATORY_CARE_PROVIDER_SITE_OTHER): Payer: Medicare Other | Admitting: Cardiology

## 2018-10-04 VITALS — Ht 66.0 in | Wt 162.0 lb

## 2018-10-04 DIAGNOSIS — Z20822 Contact with and (suspected) exposure to covid-19: Secondary | ICD-10-CM

## 2018-10-04 DIAGNOSIS — R42 Dizziness and giddiness: Secondary | ICD-10-CM

## 2018-10-04 DIAGNOSIS — I4891 Unspecified atrial fibrillation: Secondary | ICD-10-CM | POA: Diagnosis not present

## 2018-10-04 NOTE — Progress Notes (Signed)
Virtual Visit via Telephone Note   This visit type was conducted due to national recommendations for restrictions regarding the COVID-19 Pandemic (e.g. social distancing) in an effort to limit this patient's exposure and mitigate transmission in our community.  Due to her co-morbid illnesses, this patient is at least at moderate risk for complications without adequate follow up.  This format is felt to be most appropriate for this patient at this time.  The patient did not have access to video technology/had technical difficulties with video requiring transitioning to audio format only (telephone).  All issues noted in this document were discussed and addressed.  No physical exam could be performed with this format.  Please refer to the patient's chart for her  consent to telehealth for [facility]   Date:  10/04/2018   ID:  Mary [name], DOB [birth year], MRN [id number]  Patient Location: Home Provider Location: Office  PCP:  [name], MD  Cardiologist:  Carlyle Dolly, MD  Electrophysiologist:  None   Evaluation Performed:  Follow-Up Visit  Chief Complaint:  Follow up  History of Present Illness:    Mary [name] is a 78 y.o. female seen today for follow up of the following medical problems.    1. Palpitations/Afib - recent symptoms at night, can last all night long. Feeling of her heart trying to jump out of her chest. Jan 2020 monitor showed episodes of afib - has already been on coumadin due to history of AVR.   - last visit we increased toprol to 100mg  daily due to palpitations - no recent palpitations.     2. Orthostatic dizziness - recent 20 lbs weight loss, decreased appetite - recent symptoms of dizziness with standing.   - ongoing symptoms at times.    3. Fall - reports was going to get a tomato from garden, when turned around she fell. Doesn't recall or know what caused her to fall - no LOC. Does not recall how she fell. Husband thinks she  tripped on some wooden beams in the garden - came back into the house, felt weak and stumbling. Went to bed early. She reports just hasnt' felt right the last 3-4 weeks, unable to give clear description of what that means. PCP has ordered a covid test - had been outside for about 30 minutes prior to fall - normal oral intake. No N/V/D. Can have some orthostatic symptoms    The patient does not have symptoms concerning for COVID-19 infection (fever, chills, cough, or new shortness of breath).    Past Medical History:  Diagnosis Date  . Anxiety   . Diabetes mellitus with neuropathy ([place])   . Diabetic neuropathy ([place])   . DJD (degenerative joint disease)   . H/O aortic valve replacement   . Hyperlipidemia   . Hypertension   . Neuromuscular disorder ([facility])    neuropathy in feet  . Obesity   . Sleep apnea   . 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 Wears glasses    Past Surgical History:  Procedure Laterality Date  . ANKLE SURGERY     Left tendon repair  . AORTIC VALVE REPLACEMENT  03/1994  . CARDIAC CATHETERIZATION     [address] [facility]  . CERVICAL SPINE SURGERY    . COLONOSCOPY    . HEMORRHOID SURGERY    . KNEE ARTHROSCOPY Right 04/13/2015   Procedure: RIGHT KNEE ARTHROSCOPY WITH DEBRIDEMENT AND PARTIAL MEDIAL MENISCECTOMY;  Surgeon: Mcarthur Rossetti,  MD;  Location: [facility];  Service: Orthopedics;  Laterality: Right;  . KNEE ARTHROSCOPY W/ MENISCECTOMY Right 04/13/2015  . LUMBAR FUSION       Current Meds  Medication Sig  . ALPRAZolam (XANAX) 0.5 MG tablet Take 0.5 mg by mouth 4 (four) times daily as needed for anxiety or sleep (every night for sleep).   . Biotin 10000 MCG TABS Take 1 tablet by mouth daily.  . bisacodyl (DULCOLAX) 5 MG EC tablet Take 5 mg by mouth daily as needed for moderate constipation.  . cetirizine (ZYRTEC) 10 MG tablet Take 10 mg by mouth daily as needed for allergies.   . [employer] MELATONIN 3 MG TABS Take 3 mg by mouth at bedtime.  . fluticasone (FLONASE)  50 MCG/ACT nasal spray Place 2 sprays into both nostrils daily.   Marland Kitchen HYDROcodone-acetaminophen (NORCO) 10-325 MG tablet Take 1 tablet by mouth every 6 (six) hours as needed.   Marland Kitchen losartan (COZAAR) 25 MG tablet Take 25 mg by mouth daily.  . metoprolol succinate (TOPROL XL) 100 MG 24 hr tablet Take 1 tablet (100 mg total) by mouth daily. Take with or immediately following a meal.  . pantoprazole (PROTONIX) 40 MG tablet Take 1 tablet (40 mg total) by mouth daily before breakfast.  . simvastatin (ZOCOR) 40 MG tablet Take 40 mg by mouth daily at 6 PM.   . torsemide (DEMADEX) 20 MG tablet Take 1 tablet (20 mg total) by mouth daily.  . Vitamin D, Ergocalciferol, (DRISDOL) 1.25 MG (50000 UT) CAPS capsule Take 50,000 Units by mouth every 7 (seven) days.  Marland Kitchen warfarin (COUMADIN) 2 MG tablet Take 1 tablet daily or as directed by Coumadin clinic  . [DISCONTINUED] aspirin EC 81 MG tablet Take 81 mg by mouth daily.  . [DISCONTINUED] Krill Oil 1000 MG CAPS Take 1 capsule by mouth daily.  . [DISCONTINUED] losartan (COZAAR) 25 MG tablet Take 0.5 tablets (12.5 mg total) by mouth daily. (Patient taking differently: Take 25 mg by mouth daily. )     Allergies:   Tape   Social History   Tobacco Use  . Smoking status: Never Smoker  . Smokeless tobacco: Never Used  Substance Use Topics  . Alcohol use: No    Alcohol/week: 0.0 standard drinks  . Drug use: No     Family Hx: The patient's family history includes Dementia in her mother; Pneumonia in her sister.  ROS:   Please see the history of present illness.     All other systems reviewed and are negative.   Prior CV studies:   The following studies were reviewed today:  10/2015 echo Study Conclusions  - Left ventricle: The cavity size was normal. Wall thickness was increased in a pattern of moderate LVH. Systolic function was normal. The estimated ejection fraction was in the range of 55% to 60%. The study is not technically sufficient to allow  evaluation of LV diastolic function. - Aortic valve: Medical record indicated St Jude mechanical valve in the AV position, unknown size. There was no stenosis. There was mild regurgitation. - Mitral valve: Suspect possible mitral anular ring based on appearance, but cannot confirm. The valve surgery records are not available in the medical record. Moderately calcified annulus. Mildly thickened leaflets . There was mild regurgitation. - Left atrium: The atrium was moderately dilated. - Right atrium: The atrium was moderately dilated. - Atrial septum: No defect or patent foramen ovale was identified. - Tricuspid valve: There was moderate regurgitation. - Pulmonary arteries: Systolic pressure  was mildly increased. PA peak pressure: 38 mm Hg (S). - Technically adequate study.   07/2017 nuclear stress  Defect 1: There is a medium defect of mild severity present in the mid inferolateral and apical lateral location. This appears to be due to soft tissue attenuation. No ischemic zones.  This is a low risk study.  Nuclear stress EF: 63%.  LBBB present throughout study.  Jan 2020 Event monitor  7 day event monitor  Min HR 55, Max HR 110, Avg HR 70  Episodes of rate controlled atrial fibrillation are noted  No symptoms reported      Labs/Other Tests and Data Reviewed:    EKG:  No ECG reviewed.  Recent Labs: No results found for requested labs within last 8760 hours.   Recent Lipid Panel No results found for: CHOL, TRIG, HDL, CHOLHDL, LDLCALC, LDLDIRECT  Wt Readings from Last 3 Encounters:  10/04/18 162 lb (73.5 kg)  08/06/18 160 lb (72.6 kg)  04/25/18 180 lb 9.6 oz (81.9 kg)     Objective:    Vital Signs:  Ht 5\' 6"  (1.676 m)   Wt 162 lb (73.5 kg)   BMI 26.15 kg/m    Normal affect. Normal speech pattern and tone. Comfortable, no apparent distress. No audibel signs of SOB or wheezing.   ASSESSMENT & PLAN:    1. Afib - no recent palpitations,  continue current meds   2. Fall/fatigue - nonspecific symptoms of fatigue, fall as described above with very vague unclear history - unclear if anything cardiac contributing. We did increase her toprol last visit, unclear if could be having some bradycardia or low bp's. Has also had some issues with orthostatic symptoms - her pcp has ordered a covid test for these symptoms  -she will check bp's and hr's bid over the weekend and call us Monday. May consider an event monitor pending results.   COVID-19 Education: The signs and symptoms of COVID-19 were discussed with the patient and how to seek care for testing (follow up with PCP or arrange E-visit).  The importance of social distancing was discussed today.  Time:   Today, I have spent 16 minutes with the patient with telehealth technology discussing the above problems.     Medication Adjustments/Labs and Tests Ordered: Current medicines are reviewed at length with the patient today.  Concerns regarding medicines are outlined above.   Tests Ordered: No orders of the defined types were placed in this encounter.   Medication Changes: No orders of the defined types were placed in this encounter.   Follow Up:  Pending vitals report on Monday  Merrily Pew, MD  10/04/2018 2:30 PM    [place]

## 2018-10-08 LAB — NOVEL CORONAVIRUS, NAA: SARS-CoV-2, NAA: NOT DETECTED

## 2018-10-10 ENCOUNTER — Telehealth: Payer: Self-pay | Admitting: Cardiology

## 2018-10-10 DIAGNOSIS — I4891 Unspecified atrial fibrillation: Secondary | ICD-10-CM

## 2018-10-10 NOTE — Patient Instructions (Signed)
Your physician recommends that you schedule a follow-up appointment in: PENDING BLOOD PRESSURE READINGS  Your physician recommends that you continue on your current medications as directed. Please refer to the Current Medication list given to you today.  Your physician has requested that you regularly monitor and record your blood pressure readings at home TWICE DAILY AND CALL us ON Monday  Thank you for choosing [facility]!!

## 2018-10-10 NOTE — Telephone Encounter (Signed)
Patient called to report BP readings  10/04/2018     107/60 P 70   215pm                      114/68 P 68   310pm                      106/58 P 76   650PM  10/05/2018       115/64  P 64  910AM                        116/61 P 73   815PM  10/06/2018       126/65 P 81   745 AM                        125/58 P 82   745AM   10/07/2018       125/68 P  81   800AM  Patient states that Dr. Harl Bowie had requested BP readings.   [phone]

## 2018-10-11 NOTE — Telephone Encounter (Signed)
Busy

## 2018-10-11 NOTE — Telephone Encounter (Signed)
Numbers look good, if ongoing symptoms can we obtain a 1 week monitor for her for fatigue   [name] MD

## 2018-10-15 ENCOUNTER — Telehealth: Payer: Self-pay

## 2018-10-15 NOTE — Telephone Encounter (Signed)
The monitor in February was abnormal and diagnosed her abnormal heart rhythm, even more of reason to see if she is still having abnormalities with her heart rhythm that may be causing her symptoms    [name] MD

## 2018-10-15 NOTE — Telephone Encounter (Signed)
Pt agreeable to event monitor - will place orders and enroll

## 2018-10-15 NOTE — Telephone Encounter (Signed)
Called and informed patient that test for Covid 19 was NEGATIVE. Discussed signs and symptoms of Covid 19 : fever, chills, respiratory symptoms, cough, ENT symptoms, sore throat, SOB, muscle pain, diarrhea, headache, loss of taste/smell, close exposure to COVID-19 patient. Pt instructed to call PCP if they develop the above signs and sx. Pt also instructed to call 911 if having respiratory issues/distress. Discussed MyChart enrollment. Pt verbalized understanding.  

## 2018-10-15 NOTE — Telephone Encounter (Signed)
Pt says she is still fatigued but not any worse - is hesitant about event monitor since she just wore one in Feb of this year. Will forward to provider - when did you want her to f/u ?

## 2018-10-28 ENCOUNTER — Encounter (INDEPENDENT_AMBULATORY_CARE_PROVIDER_SITE_OTHER): Payer: Medicare Other

## 2018-10-28 DIAGNOSIS — I4891 Unspecified atrial fibrillation: Secondary | ICD-10-CM

## 2018-11-07 ENCOUNTER — Other Ambulatory Visit: Payer: Self-pay

## 2018-11-07 ENCOUNTER — Ambulatory Visit: Payer: Medicare Other | Admitting: Critical Care Medicine

## 2018-11-07 ENCOUNTER — Encounter: Payer: Self-pay | Admitting: Critical Care Medicine

## 2018-11-07 VITALS — BP 112/70 | HR 64 | Temp 97.9°F | Ht 66.0 in | Wt 180.0 lb

## 2018-11-07 DIAGNOSIS — R609 Edema, unspecified: Secondary | ICD-10-CM | POA: Diagnosis not present

## 2018-11-07 DIAGNOSIS — J90 Pleural effusion, not elsewhere classified: Secondary | ICD-10-CM

## 2018-11-07 DIAGNOSIS — I340 Nonrheumatic mitral (valve) insufficiency: Secondary | ICD-10-CM

## 2018-11-07 DIAGNOSIS — I5032 Chronic diastolic (congestive) heart failure: Secondary | ICD-10-CM | POA: Diagnosis not present

## 2018-11-07 NOTE — Patient Instructions (Addendum)
Thank you for visiting Dr. Carlis Abbott at [facility]. We recommend the following: Monitor the fluid on x-rays in the future. No need to drain the fluid now   Follow-up in 6 months.   Please do your part to reduce the spread of COVID-19.

## 2018-11-07 NOTE — Progress Notes (Signed)
   Subjective:    Patient ID: Mary [name], female    DOB: [birth year], 78 y.o.   MRN: [id number]  HPI    Review of Systems  Constitutional: Positive for appetite change. Negative for fever and unexpected weight change.  HENT: Negative for congestion, dental problem, ear pain, nosebleeds, postnasal drip, rhinorrhea, sinus pressure, sneezing, sore throat and trouble swallowing.   Eyes: Negative for redness and itching.  Respiratory: Positive for cough and shortness of breath. Negative for chest tightness and wheezing.   Cardiovascular: Positive for leg swelling. Negative for palpitations.  Gastrointestinal: Negative for nausea and vomiting.  Genitourinary: Negative for dysuria.  Musculoskeletal: Negative for joint swelling.  Skin: Negative for rash.  Allergic/Immunologic: Negative.  Negative for environmental allergies, food allergies and immunocompromised state.  Neurological: Negative for headaches.  Hematological: Does not bruise/bleed easily.  Psychiatric/Behavioral: Negative for dysphoric mood. The patient is not nervous/anxious.        Objective:   Physical Exam        Assessment & Plan:

## 2018-11-07 NOTE — Progress Notes (Signed)
Synopsis: Referred in August 2020 for recurrent pleural effusion by [name], MD  Subjective:   PATIENT ID: Mary [name] GENDER: female DOB: 1940/06/18, MRN: [id number]  Chief Complaint  Patient presents with  . Consult    Referred by PCP for recurrent pleural effusion. Denied any increased SOB but does have bilateral leg swelling.     Mrs. Gerena is an 78 year old woman her history of HFpEF, A. Fib, and SAVR on chronic Coumadin referred for evaluation of a recurrent pleural effusion.  She underwent a right-sided thoracentesis in 01/2017 with 1.2 L of exudative fluid by Light's criteria removed and was being followed by her cardiologist.  She had undergone thoracentesis prior, but the fluid was lost so studies were unable to be performed on it. Following her thoracentesis she had acute reaccumulation of the fluid, and she had not responded to increasing doses of diuretics. All cultures and cytology from the fluid were negative.  Following this she established care with Dr. Luan Pulling, her pulmonologist in [place].  He did not think that she required additional thoracenteses.  He is retiring, and she is transferring her care to [facility]. She has no history of cancer, liver disease, kidney disease, or rheumatologic disease.  He is a never smoker.  Currently she has dyspnea on exertion and reports low exercise tolerance.  She does not think she could walk down a block of the street.  She does not exercise on a regular basis.  She has paroxysmal nocturnal dyspnea, and sleeps with the bed that inclines the head of the bed.  She has worsening leg edema since her diuretic regimen was reduced easily.  Overall her symptoms are chronic and stable.   She has a history of OSA but does not use her's prescribed CPAP due to perceived breathlessness.    Past Medical History:  Diagnosis Date  . Anxiety   . Diabetes mellitus with neuropathy ([place])   . Diabetic neuropathy ([place])   . DJD (degenerative joint  disease)   . H/O aortic valve replacement   . Hyperlipidemia   . Hypertension   . Neuromuscular disorder (HCC)    neuropathy in feet  . Obesity   . Sleep apnea   . Stroke [facility])    " light stroke"  . Wears glasses      Family History  Problem Relation Age of Onset  . Pneumonia Sister   . Dementia Mother   . Diabetes Sister      Past Surgical History:  Procedure Laterality Date  . ANKLE SURGERY     Left tendon repair  . AORTIC VALVE REPLACEMENT  03/1994  . CARDIAC CATHETERIZATION     1996 [facility]  . CERVICAL SPINE SURGERY    . COLONOSCOPY    . HEMORRHOID SURGERY    . KNEE ARTHROSCOPY Right 04/13/2015   Procedure: RIGHT KNEE ARTHROSCOPY WITH DEBRIDEMENT AND PARTIAL MEDIAL MENISCECTOMY;  Surgeon: Mcarthur Rossetti, MD;  Location: [facility];  Service: Orthopedics;  Laterality: Right;  . KNEE ARTHROSCOPY W/ MENISCECTOMY Right 04/13/2015  . LUMBAR FUSION      Social History   Socioeconomic History  . Marital status: Married    Spouse name: Not on file  . Number of children: Not on file  . Years of education: Not on file  . Highest education level: Not on file  Occupational History  . Occupation: Disabled    Employer: RETIRED  Social Needs  . Financial resource strain: Not on file  . Food insecurity  Worry: Not on file    Inability: Not on file  . Transportation needs    Medical: Not on file    Non-medical: Not on file  Tobacco Use  . Smoking status: Never Smoker  . Smokeless tobacco: Never Used  Substance and Sexual Activity  . Alcohol use: No    Alcohol/week: 0.0 standard drinks  . Drug use: No  . Sexual activity: Not on file  Lifestyle  . Physical activity    Days per week: Not on file    Minutes per session: Not on file  . Stress: Not on file  Relationships  . Social Herbalist on phone: Not on file    Gets together: Not on file    Attends religious service: Not on file    Active member of club or organization: Not on file     Attends meetings of clubs or organizations: Not on file    Relationship status: Not on file  . Intimate partner violence    Fear of current or ex partner: Not on file    Emotionally abused: Not on file    Physically abused: Not on file    Forced sexual activity: Not on file  Other Topics Concern  . Not on file  Social History Narrative   Widowed   No regular exercise   2 children     Allergies  Allergen Reactions  . Tape Itching    Redness, Please use "paper" tape only     Immunization History  Administered Date(s) Administered  . Influenza-Unspecified 01/11/2015    Outpatient Medications Prior to Visit  Medication Sig Dispense Refill  . ALPRAZolam (XANAX) 0.5 MG tablet Take 0.5 mg by mouth 4 (four) times daily as needed for anxiety or sleep (every night for sleep).     . Biotin 10000 MCG TABS Take 1 tablet by mouth daily.    . bisacodyl (DULCOLAX) 5 MG EC tablet Take 5 mg by mouth daily as needed for moderate constipation.    . cetirizine (ZYRTEC) 10 MG tablet Take 10 mg by mouth daily as needed for allergies.   11  . [facility] MELATONIN 3 MG TABS Take 3 mg by mouth at bedtime.  11  . fluticasone (FLONASE) 50 MCG/ACT nasal spray Place 2 sprays into both nostrils daily.   11  . HYDROcodone-acetaminophen (NORCO) 10-325 MG tablet Take 1 tablet by mouth every 6 (six) hours as needed.   0  . losartan (COZAAR) 50 MG tablet Take 50 mg by mouth daily.    . metoprolol succinate (TOPROL-XL) 50 MG 24 hr tablet Take 50 mg by mouth daily. Take with or immediately following a meal.    . pantoprazole (PROTONIX) 40 MG tablet Take 1 tablet (40 mg total) by mouth daily before breakfast. 30 tablet 5  . simvastatin (ZOCOR) 40 MG tablet Take 40 mg by mouth daily at 6 PM.     . torsemide (DEMADEX) 20 MG tablet Take 1 tablet (20 mg total) by mouth daily. 30 tablet 3  . Vitamin D, Ergocalciferol, (DRISDOL) 1.25 MG (50000 UT) CAPS capsule Take 50,000 Units by mouth every 7 (seven) days.    Marland Kitchen warfarin  (COUMADIN) 2 MG tablet Take 1 tablet daily or as directed by Coumadin clinic 45 tablet 6  . losartan (COZAAR) 25 MG tablet Take 25 mg by mouth daily.    Marland Kitchen acetaminophen (TYLENOL) 500 MG tablet Take 500-1,000 mg by mouth daily as needed for moderate pain.    Marland Kitchen  metoprolol succinate (TOPROL XL) 100 MG 24 hr tablet Take 1 tablet (100 mg total) by mouth daily. Take with or immediately following a meal. 90 tablet 1   No facility-administered medications prior to visit.     Review of Systems  Constitutional: Negative for chills and fever.  HENT: Negative.   Eyes: Negative.   Respiratory: Positive for shortness of breath. Negative for cough, hemoptysis and wheezing.   Cardiovascular: Positive for leg swelling and PND. Negative for chest pain.  Gastrointestinal: Negative for heartburn, nausea and vomiting.  Genitourinary: Negative.   Musculoskeletal: Negative for myalgias.       Chronic arthritis- related knee pain  Skin: Negative for rash.       Bruise from fall recently on upper back  Neurological: Negative.   Endo/Heme/Allergies: Bruises/bleeds easily.  Psychiatric/Behavioral: Negative.      Objective:   Vitals:   11/07/18 1106  BP: 112/70  Pulse: 64  Temp: 97.9 F (36.6 C)  TempSrc: Oral  SpO2: 96%  Weight: 180 lb (81.6 kg)  Height: 5\' 6"  (1.676 m)   96% on  RA BMI Readings from Last 3 Encounters:  11/07/18 29.05 kg/m  10/04/18 26.15 kg/m  08/06/18 25.06 kg/m   Wt Readings from Last 3 Encounters:  11/07/18 180 lb (81.6 kg)  10/04/18 162 lb (73.5 kg)  08/06/18 160 lb (72.6 kg)    Physical Exam Constitutional:      General: She is not in acute distress.    Appearance: Normal appearance. She is not ill-appearing.  HENT:     Head: Normocephalic and atraumatic.     Nose:     Comments: Deferred due to masking requirement    Mouth/Throat:     Comments: Deferred due to masking requirement Eyes:     General: No scleral icterus. Neck:     Musculoskeletal: Neck  supple.  Cardiovascular:     Rate and Rhythm: Normal rate and regular rhythm.     Comments: Mechanical click.  JVD to the angle of the mandible. Pulmonary:     Comments: Breathing comfortably on room air.  Able to speak in complete sentences.  Rales in the right base, otherwise clear to auscultation bilaterally. Abdominal:     General: Abdomen is flat. There is no distension.     Palpations: Abdomen is soft.  Musculoskeletal:     Comments: Pitting edema bilaterally to the knees.  No synovitis.  Lymphadenopathy:     Cervical: No cervical adenopathy.  Skin:    General: Skin is warm and dry.     Findings: No rash.     Comments: Large bruise across her upper back  Neurological:     General: No focal deficit present.     Mental Status: She is alert.     Coordination: Coordination normal.      CBC    Component Value Date/Time   WBC 5.9 01/16/2017 1530   RBC 3.60 (L) 01/16/2017 1530   HGB 12.3 01/16/2017 1530   HCT 37.5 01/16/2017 1530   PLT 132 (L) 01/16/2017 1530   MCV 104.2 (H) 01/16/2017 1530   MCH 34.2 (H) 01/16/2017 1530   MCHC 32.8 01/16/2017 1530   RDW 15.6 (H) 01/16/2017 1530   LYMPHSABS 1.3 01/16/2017 1530   MONOABS 0.4 01/16/2017 1530   EOSABS 0.5 01/16/2017 1530   BASOSABS 0.0 01/16/2017 1530    Pleural fluid studies from 01/2017 reviewed, notable for protein 3.6 g/dL, LDH 112.  Cholesterol 34  Chest Imaging- films reviewed: Chest  x-ray 09/09/2018- small to moderate dependent right-sided pleural effusion.  When compared to several previous x-rays, it looks stable recently, but has increased slowly in size since 2018.  It is present on every CXR in our system, dating back to 08/2015.  Pulmonary Functions Testing Results: No flowsheet data found.  Pathology from thoracentesis 01/2017 : no malignant cells  Echocardiogram 10/2015: LVEF 55-60% with moderate LVH.  No regurgitation.  Normally functioning mechanical aortic valve.  Moderately dilated atria bilaterally.   Moderate tricuspid regurgitation and elevated PA systolic pressure     Assessment & Plan:     ICD-10-CM   1. Chronic pleural effusion  J90 DG Chest 2 View  2. Chronic heart failure with preserved ejection fraction (HCC)  I50.32 DG Chest 2 View  3. Mitral valve insufficiency, unspecified etiology  I34.0   4. Edema, unspecified type  R60.9 DG Chest 2 View   Chronic right dependent pleural effusion- likely this is due to her chronic heart failure and difficult to manage volume status.  Although the fluid was exudative by light criteria, the low cholesterol is more suggestive of an issue of vascular permeability rather than an inflammatory process.  This would be supported by the duration without significant change in volume. -Recommended she discuss her diuretic regimen with her cardiologist.  She may need an increased regimen based on her reaccumulation of peripheral fluid since her regimen was decreased. -At this time I do not think she would benefit from additional fluid drainage.  She agrees and is averse to having additional procedures.  In the future if the fluid increases significantly in size or there is ever concern for a superimposed process, there would be indication for drainage at that time. -Follow-up in 6 months with a chest x-ray.  Chronic HFpEF, history of SAVR, mitral regurgitation, chronic anticoagulation with Coumadin -Her cardiac history makes her at high risk for pleural effusions, and contributes to having a difficult to manage diuretic regimen.  She is high risk for complications due to polypharmacy and age.  Will defer additional changes to her diuretic regimen to her cardiologist.   Current Outpatient Medications:  .  ALPRAZolam (XANAX) 0.5 MG tablet, Take 0.5 mg by mouth 4 (four) times daily as needed for anxiety or sleep (every night for sleep). , Disp: , Rfl:  .  Biotin 10000 MCG TABS, Take 1 tablet by mouth daily., Disp: , Rfl:  .  bisacodyl (DULCOLAX) 5 MG EC  tablet, Take 5 mg by mouth daily as needed for moderate constipation., Disp: , Rfl:  .  cetirizine (ZYRTEC) 10 MG tablet, Take 10 mg by mouth daily as needed for allergies. , Disp: , Rfl: 11 .  [employer] MELATONIN 3 MG TABS, Take 3 mg by mouth at bedtime., Disp: , Rfl: 11 .  fluticasone (FLONASE) 50 MCG/ACT nasal spray, Place 2 sprays into both nostrils daily. , Disp: , Rfl: 11 .  HYDROcodone-acetaminophen (NORCO) 10-325 MG tablet, Take 1 tablet by mouth every 6 (six) hours as needed. , Disp: , Rfl: 0 .  losartan (COZAAR) 50 MG tablet, Take 50 mg by mouth daily., Disp: , Rfl:  .  metoprolol succinate (TOPROL-XL) 50 MG 24 hr tablet, Take 50 mg by mouth daily. Take with or immediately following a meal., Disp: , Rfl:  .  pantoprazole (PROTONIX) 40 MG tablet, Take 1 tablet (40 mg total) by mouth daily before breakfast., Disp: 30 tablet, Rfl: 5 .  simvastatin (ZOCOR) 40 MG tablet, Take 40 mg by mouth daily  at 6 PM. , Disp: , Rfl:  .  torsemide (DEMADEX) 20 MG tablet, Take 1 tablet (20 mg total) by mouth daily., Disp: 30 tablet, Rfl: 3 .  Vitamin D, Ergocalciferol, (DRISDOL) 1.25 MG (50000 UT) CAPS capsule, Take 50,000 Units by mouth every 7 (seven) days., Disp: , Rfl:  .  warfarin (COUMADIN) 2 MG tablet, Take 1 tablet daily or as directed by Coumadin clinic, Disp: 45 tablet, Rfl: 6   Julian Hy, DO [facility] 11/07/2018 11:52 AM

## 2018-11-27 ENCOUNTER — Telehealth: Payer: Self-pay | Admitting: *Deleted

## 2018-11-27 ENCOUNTER — Other Ambulatory Visit: Payer: Self-pay

## 2018-11-27 ENCOUNTER — Ambulatory Visit (INDEPENDENT_AMBULATORY_CARE_PROVIDER_SITE_OTHER): Payer: Medicare Other | Admitting: *Deleted

## 2018-11-27 DIAGNOSIS — Z952 Presence of prosthetic heart valve: Secondary | ICD-10-CM

## 2018-11-27 DIAGNOSIS — Z5181 Encounter for therapeutic drug level monitoring: Secondary | ICD-10-CM

## 2018-11-27 DIAGNOSIS — I639 Cerebral infarction, unspecified: Secondary | ICD-10-CM | POA: Diagnosis not present

## 2018-11-27 DIAGNOSIS — I359 Nonrheumatic aortic valve disorder, unspecified: Secondary | ICD-10-CM

## 2018-11-27 LAB — POCT INR: INR: 4.6 — AB (ref 2.0–3.0)

## 2018-11-27 NOTE — Telephone Encounter (Signed)
Pt aware and says symptoms of dizziness and fatigue remain the same no better and no worse - when did you want to f/u with this pt?

## 2018-11-27 NOTE — Telephone Encounter (Signed)
Scheduled and LM with appt on pt private VM

## 2018-11-27 NOTE — Telephone Encounter (Signed)
F/u 1 month   Mary Abts MD

## 2018-11-27 NOTE — Patient Instructions (Signed)
Hold coumadin tonight, take 1/2 tablet tomorrow then resume 1 tablet daily   Continue 1 to 2 servings of greens a week Recheck in 3 weeks

## 2018-11-27 NOTE — Telephone Encounter (Signed)
-----   Message from Arnoldo Lenis, MD sent at 11/26/2018  4:15 PM EDT ----- Monitor shows she is in afib however her heart rates are well controlled which is the most important thing. How are her symptosm of fatigue and dizziness doing?   [name] MD

## 2018-12-25 ENCOUNTER — Other Ambulatory Visit: Payer: Self-pay | Admitting: Cardiology

## 2019-01-06 ENCOUNTER — Ambulatory Visit: Payer: Medicare Other | Admitting: *Deleted

## 2019-01-06 ENCOUNTER — Ambulatory Visit (HOSPITAL_COMMUNITY)
Admission: RE | Admit: 2019-01-06 | Discharge: 2019-01-06 | Disposition: A | Discharge status: Home / Self Care | Payer: Medicare Other | Source: Ambulatory Visit | Attending: Family Medicine | Admitting: Family Medicine

## 2019-01-06 ENCOUNTER — Other Ambulatory Visit (HOSPITAL_COMMUNITY): Payer: Self-pay | Admitting: Family Medicine

## 2019-01-06 ENCOUNTER — Other Ambulatory Visit: Payer: Self-pay

## 2019-01-06 DIAGNOSIS — J90 Pleural effusion, not elsewhere classified: Secondary | ICD-10-CM

## 2019-01-06 DIAGNOSIS — R5381 Other malaise: Secondary | ICD-10-CM

## 2019-01-06 DIAGNOSIS — Z952 Presence of prosthetic heart valve: Secondary | ICD-10-CM

## 2019-01-06 DIAGNOSIS — R5383 Other fatigue: Secondary | ICD-10-CM

## 2019-01-06 DIAGNOSIS — I359 Nonrheumatic aortic valve disorder, unspecified: Secondary | ICD-10-CM | POA: Diagnosis not present

## 2019-01-06 DIAGNOSIS — Z5181 Encounter for therapeutic drug level monitoring: Secondary | ICD-10-CM | POA: Diagnosis not present

## 2019-01-06 LAB — POCT INR: INR: 5.6 — AB (ref 2.0–3.0)

## 2019-01-06 NOTE — Patient Instructions (Signed)
Hold coumadin tonight, take 1/2 tablet tomorrow then decrease dose to 1 tablet daily  Except 1/2 tablet on Mondays, Wednesdays and Fridays Continue 1 to 2 servings of greens a week Recheck in 1 week Bleeding and fall precautions discussed with pt and she verbalized understanding.

## 2019-01-21 ENCOUNTER — Encounter: Payer: Self-pay | Admitting: Cardiology

## 2019-01-21 ENCOUNTER — Ambulatory Visit (INDEPENDENT_AMBULATORY_CARE_PROVIDER_SITE_OTHER): Payer: Medicare Other | Admitting: Cardiology

## 2019-01-21 ENCOUNTER — Other Ambulatory Visit: Payer: Self-pay

## 2019-01-21 ENCOUNTER — Ambulatory Visit (INDEPENDENT_AMBULATORY_CARE_PROVIDER_SITE_OTHER): Payer: Medicare Other | Admitting: *Deleted

## 2019-01-21 VITALS — BP 92/52 | HR 64 | Ht 64.0 in | Wt 177.4 lb

## 2019-01-21 DIAGNOSIS — R0602 Shortness of breath: Secondary | ICD-10-CM | POA: Diagnosis not present

## 2019-01-21 DIAGNOSIS — I639 Cerebral infarction, unspecified: Secondary | ICD-10-CM

## 2019-01-21 DIAGNOSIS — Z5181 Encounter for therapeutic drug level monitoring: Secondary | ICD-10-CM | POA: Diagnosis not present

## 2019-01-21 DIAGNOSIS — I4891 Unspecified atrial fibrillation: Secondary | ICD-10-CM | POA: Diagnosis not present

## 2019-01-21 DIAGNOSIS — Z952 Presence of prosthetic heart valve: Secondary | ICD-10-CM | POA: Diagnosis not present

## 2019-01-21 DIAGNOSIS — E782 Mixed hyperlipidemia: Secondary | ICD-10-CM | POA: Diagnosis not present

## 2019-01-21 LAB — POCT INR: INR: 3.2 — AB (ref 2.0–3.0)

## 2019-01-21 MED ORDER — METOPROLOL SUCCINATE ER 25 MG PO TB24: 25.0000 mg | ORAL_TABLET | Freq: Every day | ORAL | 3 refills | Status: DC

## 2019-01-21 NOTE — Patient Instructions (Signed)
Continue warfarin 1 tablet daily except 1/2 tablet on Mondays, Wednesdays and Fridays Continue 1 to 2 servings of greens a week Recheck in 4 weeks

## 2019-01-21 NOTE — Patient Instructions (Addendum)
Your physician recommends that you schedule a follow-up appointment in: 2 [facility] has recommended you make the following change in your medication:   START TOPROL XL 25 MG EVERY EVENING  TAKE TOPROL XL 100 MG IN THE MORNING    Thank you for choosing [facility]!!

## 2019-01-21 NOTE — Progress Notes (Signed)
Clinical Summary Mary [name] is a 78 y.o.female seen today for follow up of the following medical problems.    1. Palpitations/Afib - recent symptoms at night, can last all night long. Feeling of her heart trying to jump out of her chest. Jan 2020 monitor showed episodes of afib - has already been on coumadin due to history of AVR.   - we increased toprol to 100mg  daily due to palpitations - 10/2018 monitor afib rate controlled throughout the study  - still with palpitations. Occurs at night just about every night, off and on    2. Orthostatic dizziness  - chronic stable symptoms unchanged.    3. Fall - reports was going to get a tomato from garden, when turned around she fell. Doesn't recall or know what caused her to fall - no LOC. Does not recall how she fell. Husband thinks she tripped on some wooden beams in the garden - came back into the house, felt weak and stumbling. Went to bed early. She reports just hasnt' felt right the last 3-4 weeks, unable to give clear description of what that means. PCP has ordered a covid test - had been outside for about 30 minutes prior to fall - normal oral intake. No N/V/D. Can have some orthostatic symptoms   4. Pleural effusion/SOB - followed by pulmonary - we tried increasing her lasix to 60mg  dailyback in 4/2018however she developed AKI(Cr 0.8 to 1.28) last Cr down to 1., and dose was decreased back to 40mg  daily.  01/16/17 large right pleural effusion  - thoracentesis 01/25/17 with 1.2 liters removed. Cultures negative, gram stain +PMNs, pH 7.6, cytology reactive mesothelial cells - pleural LDH 112 - pleural protein: 3.6 - I do not have the serum LDH and totral protein numbers available to me -Repeat CXR 02/02/17 showed reaccumulating effusion - 10/2015 echo LVEF 55-60%, normal AVR  - Some recent increase in LE edema, some increase in SOB.       5. Aortic valve replacmenet - St Jude AVF in 1995/1996  - 10/2015 echo LVEF 55-60%, cannot eval diastolic function, normal AVR with mild AI .  - no recent issues.    6. HTN -compliant with meds   7. Hyperlipidemia -labs followed by pcp, compliant with statin  8. OSA - noncompliant with cpap  9. History of CVA - followed by pcp      Past Medical History:  Diagnosis Date  . Anxiety   . Diabetes mellitus with neuropathy ([place])   . Diabetic neuropathy ([place])   . DJD (degenerative joint disease)   . H/O aortic valve replacement   . Hyperlipidemia   . Hypertension   . Neuromuscular disorder (HCC)    neuropathy in feet  . Obesity   . Sleep apnea   . Stroke [facility])    " light stroke"  . Wears glasses      Allergies  Allergen Reactions  . Tape Itching    Redness, Please use "paper" tape only     Current Outpatient Medications  Medication Sig Dispense Refill  . ALPRAZolam (XANAX) 0.5 MG tablet Take 0.5 mg by mouth 4 (four) times daily as needed for anxiety or sleep (every night for sleep).     . Biotin 10000 MCG TABS Take 1 tablet by mouth daily.    . bisacodyl (DULCOLAX) 5 MG EC tablet Take 5 mg by mouth daily as needed for moderate constipation.    . cetirizine (ZYRTEC) 10 MG tablet Take 10 mg  by mouth daily as needed for allergies.   11  . [facility]S MELATONIN 3 MG TABS Take 3 mg by mouth at bedtime.  11  . fluticasone (FLONASE) 50 MCG/ACT nasal spray Place 2 sprays into both nostrils daily.   11  . HYDROcodone-acetaminophen (NORCO) 10-325 MG tablet Take 1 tablet by mouth every 6 (six) hours as needed.   0  . losartan (COZAAR) 25 MG tablet Take 12.5 mg by mouth daily.    . metoprolol succinate (TOPROL-XL) 100 MG 24 hr tablet Take 100 mg by mouth daily. Take with or immediately following a meal.    . pantoprazole (PROTONIX) 40 MG tablet Take 1 tablet (40 mg total) by mouth daily before breakfast. 30 tablet 5  . simvastatin (ZOCOR) 40 MG tablet Take 40 mg by mouth daily at 6 PM.     . torsemide (DEMADEX) 20 MG tablet Take  40 mg by mouth daily.    . Vitamin D, Ergocalciferol, (DRISDOL) 1.25 MG (50000 UT) CAPS capsule Take 50,000 Units by mouth every 7 (seven) days.    Marland Kitchen warfarin (COUMADIN) 2 MG tablet Take 1 tablet daily or as directed by Coumadin clinic 45 tablet 6  . metoprolol succinate (TOPROL XL) 25 MG 24 hr tablet Take 1 tablet (25 mg total) by mouth daily. 30 tablet 3   No current facility-administered medications for this visit.      Past Surgical History:  Procedure Laterality Date  . ANKLE SURGERY     Left tendon repair  . AORTIC VALVE REPLACEMENT  03/1994  . CARDIAC CATHETERIZATION     1996 [facility]  . CERVICAL SPINE SURGERY    . COLONOSCOPY    . HEMORRHOID SURGERY    . KNEE ARTHROSCOPY Right 04/13/2015   Procedure: RIGHT KNEE ARTHROSCOPY WITH DEBRIDEMENT AND PARTIAL MEDIAL MENISCECTOMY;  Surgeon: Mcarthur Rossetti, MD;  Location: [facility];  Service: Orthopedics;  Laterality: Right;  . KNEE ARTHROSCOPY W/ MENISCECTOMY Right 04/13/2015  . LUMBAR FUSION       Allergies  Allergen Reactions  . Tape Itching    Redness, Please use "paper" tape only      Family History  Problem Relation Age of Onset  . Pneumonia Sister   . Dementia Mother   . Diabetes Sister      Social History Mary [name] reports that she has never smoked. She has never used smokeless tobacco. Mary [name] reports no history of alcohol use.   Review of Systems CONSTITUTIONAL: No weight loss, fever, chills, weakness or fatigue.  HEENT: Eyes: No visual loss, blurred vision, double vision or yellow sclerae.No hearing loss, sneezing, congestion, runny nose or sore throat.  SKIN: No rash or itching.  CARDIOVASCULAR: per hpi RESPIRATORY: per hpi GASTROINTESTINAL: No anorexia, nausea, vomiting or diarrhea. No abdominal pain or blood.  GENITOURINARY: No burning on urination, no polyuria NEUROLOGICAL: No headache, dizziness, syncope, paralysis, ataxia, numbness or tingling in the extremities. No change in bowel or bladder  control.  MUSCULOSKELETAL: No muscle, back pain, joint pain or stiffness.  LYMPHATICS: No enlarged nodes. No history of splenectomy.  PSYCHIATRIC: No history of depression or anxiety.  ENDOCRINOLOGIC: No reports of sweating, cold or heat intolerance. No polyuria or polydipsia.  Marland Kitchen   Physical Examination Vitals:   01/21/19 1414  BP: (!) 92/52  Pulse: 64  SpO2: 100%   Filed Weights   01/21/19 1414  Weight: 177 lb 6.4 oz (80.5 kg)    Gen: resting comfortably, no acute distress HEENT: no scleral  icterus, pupils equal round and reactive, no palptable cervical adenopathy,  CV: irreg, no m/r/g Resp: Clear to auscultation bilaterally GI: abdomen is soft, non-tender, non-distended, normal bowel sounds, no hepatosplenomegaly MSK: extremities are warm, no edema.  Skin: warm, no rash Neuro:  no focal deficits Psych: appropriate affect   Diagnostic Studies 10/2015 echo Study Conclusions  - Left ventricle: The cavity size was normal. Wall thickness was increased in a pattern of moderate LVH. Systolic function was normal. The estimated ejection fraction was in the range of 55% to 60%. The study is not technically sufficient to allow evaluation of LV diastolic function. - Aortic valve: Medical record indicated St Jude mechanical valve in the AV position, unknown size. There was no stenosis. There was mild regurgitation. - Mitral valve: Suspect possible mitral anular ring based on appearance, but cannot confirm. The valve surgery records are not available in the medical record. Moderately calcified annulus. Mildly thickened leaflets . There was mild regurgitation. - Left atrium: The atrium was moderately dilated. - Right atrium: The atrium was moderately dilated. - Atrial septum: No defect or patent foramen ovale was identified. - Tricuspid valve: There was moderate regurgitation. - Pulmonary arteries: Systolic pressure was mildly increased. PA peak pressure: 38  mm Hg (S). - Technically adequate study.   07/2017 nuclear stress  Defect 1: There is a medium defect of mild severity present in the mid inferolateral and apical lateral location. This appears to be due to soft tissue attenuation. No ischemic zones.  This is a low risk study.  Nuclear stress EF: 63%.  LBBB present throughout study.  Jan 2020 Event monitor  7 day event monitor  Min HR 55, Max HR 110, Avg HR 70  Episodes of rate controlled atrial fibrillation are noted  No symptoms reported   10/2018 heart monitor 7 days  7 day event monitor  Min HR 55, Max HR 119, Avg HR 71  No symptoms reported  Tracings show she is in afib throughout the study, rate controlled    Assessment and Plan  1. Afib - some recent palpitatoins, primarily at night. She will continue toprol 100mg  in AM, try taking additoinal 25mg  in evening to see if helps with night time symptoms   2. SOB/pleural effusions continue torsemide   3.Aortic valve replacement - metal valve placed in 1995/1996, normal function by last echo 10/2015 - no recent issues, continue current meds including coumadin and ASA  4. HTN -at goal, continue current meds  5. Hyperlipidemia -she will continue statin     Arnoldo Lenis, M.D

## 2019-01-22 ENCOUNTER — Encounter: Payer: Self-pay | Admitting: *Deleted

## 2019-03-20 ENCOUNTER — Ambulatory Visit (INDEPENDENT_AMBULATORY_CARE_PROVIDER_SITE_OTHER): Payer: Medicare Other | Admitting: *Deleted

## 2019-03-20 ENCOUNTER — Other Ambulatory Visit: Payer: Self-pay

## 2019-03-20 DIAGNOSIS — I639 Cerebral infarction, unspecified: Secondary | ICD-10-CM

## 2019-03-20 DIAGNOSIS — I4891 Unspecified atrial fibrillation: Secondary | ICD-10-CM

## 2019-03-20 DIAGNOSIS — Z5181 Encounter for therapeutic drug level monitoring: Secondary | ICD-10-CM | POA: Diagnosis not present

## 2019-03-20 DIAGNOSIS — Z952 Presence of prosthetic heart valve: Secondary | ICD-10-CM | POA: Diagnosis not present

## 2019-03-20 LAB — POCT INR: INR: 5.3 — AB (ref 2.0–3.0)

## 2019-03-20 NOTE — Patient Instructions (Signed)
Hold coumadin tonight and tomorrow night then resume 1 tablet daily except 1/2 tablet on Mondays, Wednesdays and Fridays Continue 1 to 2 servings of greens a week Recheck in 2 weeks

## 2019-03-27 ENCOUNTER — Emergency Department (HOSPITAL_COMMUNITY)
Admission: EM | Admit: 2019-03-27 | Discharge: 2019-03-27 | Disposition: A | Discharge status: Home / Self Care | Payer: Medicare Other | Attending: Emergency Medicine | Admitting: Emergency Medicine

## 2019-03-27 ENCOUNTER — Ambulatory Visit: Payer: Medicare Other | Admitting: Cardiology

## 2019-03-27 ENCOUNTER — Other Ambulatory Visit: Payer: Self-pay

## 2019-03-27 ENCOUNTER — Emergency Department (HOSPITAL_COMMUNITY): Payer: Medicare Other

## 2019-03-27 ENCOUNTER — Encounter (HOSPITAL_COMMUNITY): Payer: Self-pay | Admitting: Emergency Medicine

## 2019-03-27 DIAGNOSIS — R531 Weakness: Secondary | ICD-10-CM | POA: Diagnosis not present

## 2019-03-27 DIAGNOSIS — E1140 Type 2 diabetes mellitus with diabetic neuropathy, unspecified: Secondary | ICD-10-CM | POA: Insufficient documentation

## 2019-03-27 DIAGNOSIS — I10 Essential (primary) hypertension: Secondary | ICD-10-CM | POA: Diagnosis not present

## 2019-03-27 DIAGNOSIS — R600 Localized edema: Secondary | ICD-10-CM

## 2019-03-27 DIAGNOSIS — Z952 Presence of prosthetic heart valve: Secondary | ICD-10-CM | POA: Insufficient documentation

## 2019-03-27 DIAGNOSIS — R2243 Localized swelling, mass and lump, lower limb, bilateral: Secondary | ICD-10-CM | POA: Diagnosis present

## 2019-03-27 DIAGNOSIS — Z7901 Long term (current) use of anticoagulants: Secondary | ICD-10-CM | POA: Diagnosis not present

## 2019-03-27 DIAGNOSIS — I4891 Unspecified atrial fibrillation: Secondary | ICD-10-CM | POA: Diagnosis not present

## 2019-03-27 DIAGNOSIS — Z7984 Long term (current) use of oral hypoglycemic drugs: Secondary | ICD-10-CM | POA: Insufficient documentation

## 2019-03-27 LAB — BASIC METABOLIC PANEL
Anion gap: 7 (ref 5–15)
BUN: 24 mg/dL — ABNORMAL HIGH (ref 8–23)
CO2: 30 mmol/L (ref 22–32)
Calcium: 8.6 mg/dL — ABNORMAL LOW (ref 8.9–10.3)
Chloride: 102 mmol/L (ref 98–111)
Creatinine, Ser: 1.43 mg/dL — ABNORMAL HIGH (ref 0.44–1.00)
GFR calc Af Amer: 40 mL/min — ABNORMAL LOW (ref >60)
GFR calc non Af Amer: 35 mL/min — ABNORMAL LOW (ref >60)
Glucose, Bld: 106 mg/dL — ABNORMAL HIGH (ref 70–99)
Potassium: 4 mmol/L (ref 3.5–5.1)
Sodium: 139 mmol/L (ref 135–145)

## 2019-03-27 LAB — CBC
HCT: 33.8 % — ABNORMAL LOW (ref 36.0–46.0)
Hemoglobin: 10.4 g/dL — ABNORMAL LOW (ref 12.0–15.0)
MCH: 35.3 pg — ABNORMAL HIGH (ref 26.0–34.0)
MCHC: 30.8 g/dL (ref 30.0–36.0)
MCV: 114.6 fL — ABNORMAL HIGH (ref 80.0–100.0)
Platelets: 139 10*3/uL — ABNORMAL LOW (ref 150–400)
RBC: 2.95 MIL/uL — ABNORMAL LOW (ref 3.87–5.11)
RDW: 16.5 % — ABNORMAL HIGH (ref 11.5–15.5)
WBC: 4.2 10*3/uL (ref 4.0–10.5)
nRBC: 0 % (ref 0.0–0.2)

## 2019-03-27 LAB — PROTIME-INR
INR: 3.9 — ABNORMAL HIGH (ref 0.8–1.2)
Prothrombin Time: 37.9 seconds — ABNORMAL HIGH (ref 11.4–15.2)

## 2019-03-27 MED ORDER — FUROSEMIDE 40 MG PO TABS
40.0000 mg | ORAL_TABLET | Freq: Once | ORAL | Status: AC
  Administered 2019-03-27: 40 mg via ORAL
  Filled 2019-03-27: qty 1

## 2019-03-27 NOTE — ED Provider Notes (Addendum)
[facility] Provider Note   CSN: [id number] Arrival date & time: 03/27/19  1301     History Chief Complaint  Patient presents with  . Leg Swelling    Mary [name] is a 79 y.o. female.  Chief complaint bilateral lower extremity edema.  Patient states the symptoms are worse over the past several days.  She is weak and is having difficulty walking.  No frank chest pain or dyspnea.  She claims to be taking a "fluid pill".  However, no diuretics are noted on her medication list.  No fever, sweats, chills, cough, Covid exposures.        Past Medical History:  Diagnosis Date  . Anxiety   . Diabetes mellitus with neuropathy ([place])   . Diabetic neuropathy ([place])   . DJD (degenerative joint disease)   . H/O aortic valve replacement   . Hyperlipidemia   . Hypertension   . Neuromuscular disorder ([facility])    neuropathy in feet  . Obesity   . Sleep apnea   . Stroke [facility])    " light stroke"  . Wears glasses     Patient Active Problem List   Diagnosis Date Noted  . S/P right knee arthroscopy 04/13/2015  . OSA (obstructive sleep apnea) 01/01/2013  . Diabetes ([place]) 10/23/2012  . Aortic valve disorder 06/30/2010  . CVA (cerebral vascular accident) ([place]) 06/30/2010  . S/P aortic valve replacement 06/30/2010  . Hyperlipidemia 09/29/2008  . Obesity 09/29/2008  . Essential hypertension 09/29/2008  . DEGENERATIVE JOINT DISEASE 09/29/2008    Past Surgical History:  Procedure Laterality Date  . ANKLE SURGERY     Left tendon repair  . AORTIC VALVE REPLACEMENT  03/1994  . CARDIAC CATHETERIZATION     1996 [facility]  . CERVICAL SPINE SURGERY    . COLONOSCOPY    . HEMORRHOID SURGERY    . KNEE ARTHROSCOPY Right 04/13/2015   Procedure: RIGHT KNEE ARTHROSCOPY WITH DEBRIDEMENT AND PARTIAL MEDIAL MENISCECTOMY;  Surgeon: Mcarthur Rossetti, MD;  Location: [facility];  Service: Orthopedics;  Laterality: Right;  . KNEE ARTHROSCOPY W/ MENISCECTOMY Right 04/13/2015  . LUMBAR  FUSION       OB History   No obstetric history on file.     Family History  Problem Relation Age of Onset  . Pneumonia Sister   . Dementia Mother   . Diabetes Sister     Social History   Tobacco Use  . Smoking status: Never Smoker  . Smokeless tobacco: Never Used  Substance Use Topics  . Alcohol use: No    Alcohol/week: 0.0 standard drinks  . Drug use: No    Home Medications Prior to Admission medications   Medication Sig Start Date End Date Taking? Authorizing Provider  ALPRAZolam Duanne Moron) 0.5 MG tablet Take 0.5 mg by mouth 4 (four) times daily as needed for anxiety or sleep (every night for sleep).     [provider]  Biotin 10000 MCG TABS Take 1 tablet by mouth daily.    [provider]  bisacodyl (DULCOLAX) 5 MG EC tablet Take 5 mg by mouth daily as needed for moderate constipation.    [provider]  cetirizine (ZYRTEC) 10 MG tablet Take 10 mg by mouth daily as needed for allergies.  02/16/15   [provider]  [facility] MELATONIN 3 MG TABS Take 3 mg by mouth at bedtime. 01/12/15   [provider]  fluticasone (FLONASE) 50 MCG/ACT nasal spray Place 2 sprays into both nostrils  daily.  01/13/15   [provider]  HYDROcodone-acetaminophen (NORCO) 10-325 MG tablet Take 1 tablet by mouth every 6 (six) hours as needed.  12/18/15   [provider]  losartan (COZAAR) 25 MG tablet Take 12.5 mg by mouth daily.    [provider]  metoprolol succinate (TOPROL XL) 25 MG 24 hr tablet Take 1 tablet (25 mg total) by mouth daily. 01/21/19   Arnoldo Lenis, MD  metoprolol succinate (TOPROL-XL) 100 MG 24 hr tablet Take 100 mg by mouth daily. Take with or immediately following a meal.    [provider]  pantoprazole (PROTONIX) 40 MG tablet Take 1 tablet (40 mg total) by mouth daily before breakfast. 03/21/16   Rehman, Mechele Dawley, MD  simvastatin (ZOCOR) 40 MG tablet Take 40 mg by mouth daily at 6 PM.  10/16/12    [provider]  torsemide (DEMADEX) 20 MG tablet Take 40 mg by mouth daily.    [provider]  Vitamin D, Ergocalciferol, (DRISDOL) 1.25 MG (50000 UT) CAPS capsule Take 50,000 Units by mouth every 7 (seven) days.    [provider]  warfarin (COUMADIN) 2 MG tablet Take 1 tablet daily or as directed by Coumadin clinic 09/17/18   Arnoldo Lenis, MD    Allergies    Tape  Review of Systems   Review of Systems  All other systems reviewed and are negative.   Physical Exam Updated Vital Signs BP (!) 157/78   Pulse 89   Temp 97.9 F (36.6 C) (Oral)   Resp 16   Ht 5\' 10"  (1.778 m)   Wt 79.4 kg   SpO2 98%   BMI 25.11 kg/m   Physical Exam Vitals and nursing note reviewed.  Constitutional:      Appearance: She is well-developed.     Comments: nad  HENT:     Head: Normocephalic and atraumatic.  Eyes:     Conjunctiva/sclera: Conjunctivae normal.  Cardiovascular:     Rate and Rhythm: Normal rate and regular rhythm.  Pulmonary:     Effort: Pulmonary effort is normal.     Breath sounds: Normal breath sounds.  Abdominal:     General: Bowel sounds are normal.     Palpations: Abdomen is soft.  Musculoskeletal:        General: Normal range of motion.     Cervical back: Neck supple.  Skin:    General: Skin is warm and dry.     Comments: Pale; 3+ lower extremity edema  Neurological:     General: No focal deficit present.     Mental Status: She is alert and oriented to person, place, and time.  Psychiatric:        Behavior: Behavior normal.     ED Results / Procedures / Treatments   Labs (all labs ordered are listed, but only abnormal results are displayed) Labs Reviewed  BASIC METABOLIC PANEL - Abnormal; Notable for the following components:      Result Value   Glucose, Bld 106 (*)    BUN 24 (*)    Creatinine, Ser 1.43 (*)    Calcium 8.6 (*)    GFR calc non Af Amer 35 (*)    GFR calc Af Amer 40 (*)    All other components within normal  limits  CBC - Abnormal; Notable for the following components:   RBC 2.95 (*)    Hemoglobin 10.4 (*)    HCT 33.8 (*)    MCV 114.6 (*)  MCH 35.3 (*)    RDW 16.5 (*)    Platelets 139 (*)    All other components within normal limits  PROTIME-INR - Abnormal; Notable for the following components:   Prothrombin Time 37.9 (*)    INR 3.9 (*)    All other components within normal limits    EKG EKG Interpretation  Date/Time:  Thursday March 27 2019 13:27:37 EST Ventricular Rate:  78 PR Interval:    QRS Duration: 130 QT Interval:  434 QTC Calculation: 494 R Axis:   147 Text Interpretation: Atrial fibrillation with premature ventricular or aberrantly conducted complexes Right axis deviation Non-specific intra-ventricular conduction block Possible Right ventricular hypertrophy Nonspecific T wave abnormality Abnormal ECG new afib from prior 7/09 Confirmed by Aletta Edouard [id number]) on 03/27/2019 1:32:55 PM   Radiology DG Chest 2 View  Result Date: 03/27/2019 CLINICAL DATA:  Mild chest pain with bilateral lower extremity swelling EXAM: CHEST - 2 VIEW COMPARISON:  03/02/2019 FINDINGS: Bilateral mild interstitial thickening. Small right pleural effusion. No pneumothorax. Stable cardiomegaly. Prior median sternotomy. No acute osseous abnormality. IMPRESSION: Cardiomegaly with bilateral mild interstitial thickening likely reflecting interstitial edema. Small right pleural effusion. Electronically Signed   By: Kathreen Devoid   On: 03/27/2019 13:51   US Venous Img Lower Bilateral  Result Date: 03/27/2019 CLINICAL DATA:  Bilateral leg swelling for 3 weeks. EXAM: BILATERAL LOWER EXTREMITY VENOUS DOPPLER ULTRASOUND TECHNIQUE: Gray-scale sonography with graded compression, as well as color Doppler and duplex ultrasound were performed to evaluate the lower extremity deep venous systems from the level of the common femoral vein and including the common femoral, femoral, profunda femoral, popliteal and calf  veins including the posterior tibial, peroneal and gastrocnemius veins when visible. The superficial great saphenous vein was also interrogated. Spectral Doppler was utilized to evaluate flow at rest and with distal augmentation maneuvers in the common femoral, femoral and popliteal veins. COMPARISON:  11/30/2014 FINDINGS: RIGHT LOWER EXTREMITY Common Femoral Vein: No evidence of thrombus. Normal compressibility, respiratory phasicity and response to augmentation. Saphenofemoral Junction: No evidence of thrombus. Normal compressibility and flow on color Doppler imaging. Profunda Femoral Vein: No evidence of thrombus. Normal compressibility and flow on color Doppler imaging. Femoral Vein: No evidence of thrombus. Normal compressibility, respiratory phasicity and response to augmentation. Popliteal Vein: No evidence of thrombus. Normal compressibility, respiratory phasicity and response to augmentation. Calf Veins: Visualized right deep calf veins are patent without thrombus. Other Findings:  None. LEFT LOWER EXTREMITY Common Femoral Vein: No evidence of thrombus. Normal compressibility, respiratory phasicity and response to augmentation. Saphenofemoral Junction: No evidence of thrombus. Normal compressibility and flow on color Doppler imaging. Profunda Femoral Vein: No evidence of thrombus. Normal compressibility and flow on color Doppler imaging. Femoral Vein: No evidence of thrombus. Normal compressibility, respiratory phasicity and response to augmentation. Popliteal Vein: No evidence of thrombus. Normal compressibility, respiratory phasicity and response to augmentation. Calf Veins: Visualized left deep calf veins are patent without thrombus. Other Findings:  None. IMPRESSION: No evidence of deep venous thrombosis in either lower extremity. Electronically Signed   By: Markus Daft M.D.   On: 03/27/2019 17:50    Procedures Procedures (including critical care time)  Medications Ordered in ED Medications    furosemide (LASIX) tablet 40 mg (40 mg Oral Given 03/27/19 1736)    ED Course  I have reviewed the triage vital signs and the nursing notes.  Pertinent labs & imaging results that were available during my care of the patient were reviewed by me and  considered in my medical decision making (see chart for details).    MDM Rules/Calculators/A&P                      Patient is in no acute distress.  Suspect acute on chronic lower extremity edema.  Will obtain bilateral lower extremity ultrasound to rule out DVT.  Rx Lasix 40 mg p.o.  Chest x-ray shows mild pulmonary edema.  Patient is hemodynamically stable.  No respiratory distress.  I think she is stable for outpatient management.  She is uncertain about what diuretic medication she is on.  I have suggested that she call her primary care doctor tomorrow for medication management.   Final Clinical Impression(s) / ED Diagnoses Final diagnoses:  Bilateral lower extremity edema    Rx / DC Orders ED Discharge Orders    None       Nat Christen, MD 03/27/19 1704    Nat Christen, MD 03/27/19 2230

## 2019-03-27 NOTE — ED Notes (Signed)
Pt ambulated to restroom and given water per request

## 2019-03-27 NOTE — ED Triage Notes (Addendum)
Pt brought in via [employer], cbg 98, pt husband reports BLE leg swelling and reports is "currently managing fluid pill dose." pt denies sob at this time. nad noted. +2 edema in BLE, moderate dp pulses palpated in triage. Pt reports intermittent hand swelling as well.

## 2019-03-27 NOTE — Discharge Instructions (Addendum)
Test showed no blood clot in your legs.  You do have a small amount of excessive fluid in your lungs and your legs.  Will need to increase your fluid medication.  However, I do not know exactly what you are taking.  Try to call your primary care doctor tomorrow to adjust your fluid medication

## 2019-04-02 ENCOUNTER — Other Ambulatory Visit: Payer: Self-pay

## 2019-04-02 ENCOUNTER — Ambulatory Visit (INDEPENDENT_AMBULATORY_CARE_PROVIDER_SITE_OTHER): Payer: Medicare Other | Admitting: *Deleted

## 2019-04-02 DIAGNOSIS — Z952 Presence of prosthetic heart valve: Secondary | ICD-10-CM

## 2019-04-02 DIAGNOSIS — I4891 Unspecified atrial fibrillation: Secondary | ICD-10-CM

## 2019-04-02 DIAGNOSIS — Z5181 Encounter for therapeutic drug level monitoring: Secondary | ICD-10-CM | POA: Diagnosis not present

## 2019-04-02 LAB — POCT INR: INR: 4.2 — AB (ref 2.0–3.0)

## 2019-04-02 NOTE — Patient Instructions (Signed)
Hold coumadin tonight then decrease dose to 1/2 tablet daily except 1 tablet on Sundays, Tuesdays and Thursdays Continue 1 to 2 servings of greens a week Recheck in 2 weeks

## 2019-04-09 ENCOUNTER — Ambulatory Visit: Payer: Medicare Other | Attending: Internal Medicine

## 2019-04-09 DIAGNOSIS — Z23 Encounter for immunization: Secondary | ICD-10-CM

## 2019-04-09 NOTE — Progress Notes (Signed)
   Covid-19 Vaccination Clinic  Name:  Mary [name]    MRN: [id number] DOB: [birth year]  04/09/2019  [name] was observed post Covid-19 immunization for 15 minutes without incidence. She was provided with Vaccine Information Sheet and instruction to access the V-Safe system.   [name] was instructed to call 911 with any severe reactions post vaccine: Marland Kitchen Difficulty breathing  . Swelling of your face and throat  . A fast heartbeat  . A bad rash all over your body  . Dizziness and weakness    Immunizations Administered    Name Date Dose VIS Date Route   Pfizer COVID-19 Vaccine 04/09/2019  6:12 PM 0.3 mL 02/28/2019 Intramuscular   Manufacturer: [place]   Lot: [id number]   [place]: [id number]

## 2019-04-12 ENCOUNTER — Other Ambulatory Visit: Payer: Self-pay | Admitting: Cardiology

## 2019-04-14 ENCOUNTER — Telehealth: Payer: Self-pay | Admitting: *Deleted

## 2019-04-14 NOTE — Telephone Encounter (Signed)
Request refill on Torsemide 20mg  - now taking 2 tabs in the morning & 2 tab in the evening.   Discussed with Dr. Karie Kirks on the phone this morning & increase dose was suggested by pcp.  Wants to make sure that he does not run out.  Has enough to last till end of the week.

## 2019-04-14 NOTE — Telephone Encounter (Signed)
Ok to refill, needs a BMET/Mg in 1 week   [name] MD

## 2019-04-15 ENCOUNTER — Ambulatory Visit (INDEPENDENT_AMBULATORY_CARE_PROVIDER_SITE_OTHER): Payer: Medicare Other | Admitting: *Deleted

## 2019-04-15 DIAGNOSIS — I639 Cerebral infarction, unspecified: Secondary | ICD-10-CM

## 2019-04-15 DIAGNOSIS — Z5181 Encounter for therapeutic drug level monitoring: Secondary | ICD-10-CM

## 2019-04-15 DIAGNOSIS — Z952 Presence of prosthetic heart valve: Secondary | ICD-10-CM | POA: Diagnosis not present

## 2019-04-15 DIAGNOSIS — I4891 Unspecified atrial fibrillation: Secondary | ICD-10-CM

## 2019-04-15 LAB — POCT INR: INR: 4.3 — AB (ref 2.0–3.0)

## 2019-04-15 MED ORDER — TORSEMIDE 20 MG PO TABS: 40.0000 mg | ORAL_TABLET | Freq: Two times a day (BID) | ORAL | 3 refills | Status: DC

## 2019-04-15 NOTE — Telephone Encounter (Signed)
Mary [name] (husband) notified & verbalized understanding.  90 day supply sent to [facility] at his request.  States that Dr. Karie Kirks is planning to do labs soon.  Informed patient if pmd has not ordered within then next 7-10 days to call us back so we can provide lab order.

## 2019-04-15 NOTE — Patient Instructions (Signed)
Hold coumadin tonight then decrease dose to 1/2 tablet daily except 1 tablet on SundaysContinue 1 to 2 servings of greens a week Recheck in 3 weeks

## 2019-04-16 ENCOUNTER — Telehealth: Payer: Self-pay | Admitting: Cardiology

## 2019-04-16 NOTE — Telephone Encounter (Signed)
Mr. Trimboli called stating that he went to the pharmacy and picked up torsemide (DEMADEX) 20 MG tablet States that they gave him 2 bottles of 180 oills,

## 2019-04-16 NOTE — Telephone Encounter (Signed)
Pt was given 2 bottles of torsemide both with 180 tablets - aware that we sent in 360 tablets of torsemide 20 mg per pt request and we had pt taking 2 tablets twice daily - pt voiced understanding that this is the same amount

## 2019-04-23 ENCOUNTER — Emergency Department (HOSPITAL_COMMUNITY): Payer: Medicare Other

## 2019-04-23 ENCOUNTER — Other Ambulatory Visit: Payer: Self-pay

## 2019-04-23 ENCOUNTER — Inpatient Hospital Stay (HOSPITAL_COMMUNITY)
Admission: EM | Admit: 2019-04-23 | Discharge: 2019-04-27 | DRG: 291 | Disposition: A | Discharge status: Home Health | Payer: Medicare Other | Attending: Internal Medicine | Admitting: Internal Medicine

## 2019-04-23 ENCOUNTER — Encounter (HOSPITAL_COMMUNITY): Payer: Self-pay | Admitting: Emergency Medicine

## 2019-04-23 DIAGNOSIS — I130 Hypertensive heart and chronic kidney disease with heart failure and stage 1 through stage 4 chronic kidney disease, or unspecified chronic kidney disease: Principal | ICD-10-CM | POA: Diagnosis present

## 2019-04-23 DIAGNOSIS — E861 Hypovolemia: Secondary | ICD-10-CM | POA: Diagnosis present

## 2019-04-23 DIAGNOSIS — I5033 Acute on chronic diastolic (congestive) heart failure: Secondary | ICD-10-CM | POA: Diagnosis present

## 2019-04-23 DIAGNOSIS — I361 Nonrheumatic tricuspid (valve) insufficiency: Secondary | ICD-10-CM | POA: Diagnosis not present

## 2019-04-23 DIAGNOSIS — I5032 Chronic diastolic (congestive) heart failure: Secondary | ICD-10-CM | POA: Diagnosis present

## 2019-04-23 DIAGNOSIS — Z9181 History of falling: Secondary | ICD-10-CM

## 2019-04-23 DIAGNOSIS — T502X5A Adverse effect of carbonic-anhydrase inhibitors, benzothiadiazides and other diuretics, initial encounter: Secondary | ICD-10-CM | POA: Diagnosis present

## 2019-04-23 DIAGNOSIS — Z952 Presence of prosthetic heart valve: Secondary | ICD-10-CM

## 2019-04-23 DIAGNOSIS — F419 Anxiety disorder, unspecified: Secondary | ICD-10-CM | POA: Diagnosis present

## 2019-04-23 DIAGNOSIS — E8809 Other disorders of plasma-protein metabolism, not elsewhere classified: Secondary | ICD-10-CM | POA: Diagnosis present

## 2019-04-23 DIAGNOSIS — Z6825 Body mass index (BMI) 25.0-25.9, adult: Secondary | ICD-10-CM

## 2019-04-23 DIAGNOSIS — Z981 Arthrodesis status: Secondary | ICD-10-CM

## 2019-04-23 DIAGNOSIS — E1122 Type 2 diabetes mellitus with diabetic chronic kidney disease: Secondary | ICD-10-CM | POA: Diagnosis present

## 2019-04-23 DIAGNOSIS — D689 Coagulation defect, unspecified: Secondary | ICD-10-CM

## 2019-04-23 DIAGNOSIS — R609 Edema, unspecified: Secondary | ICD-10-CM | POA: Diagnosis not present

## 2019-04-23 DIAGNOSIS — E669 Obesity, unspecified: Secondary | ICD-10-CM | POA: Diagnosis present

## 2019-04-23 DIAGNOSIS — Z20822 Contact with and (suspected) exposure to covid-19: Secondary | ICD-10-CM | POA: Diagnosis present

## 2019-04-23 DIAGNOSIS — D631 Anemia in chronic kidney disease: Secondary | ICD-10-CM | POA: Diagnosis present

## 2019-04-23 DIAGNOSIS — N179 Acute kidney failure, unspecified: Secondary | ICD-10-CM | POA: Diagnosis present

## 2019-04-23 DIAGNOSIS — Z833 Family history of diabetes mellitus: Secondary | ICD-10-CM

## 2019-04-23 DIAGNOSIS — E785 Hyperlipidemia, unspecified: Secondary | ICD-10-CM | POA: Diagnosis present

## 2019-04-23 DIAGNOSIS — I4819 Other persistent atrial fibrillation: Secondary | ICD-10-CM | POA: Diagnosis present

## 2019-04-23 DIAGNOSIS — I10 Essential (primary) hypertension: Secondary | ICD-10-CM | POA: Diagnosis present

## 2019-04-23 DIAGNOSIS — Z79891 Long term (current) use of opiate analgesic: Secondary | ICD-10-CM

## 2019-04-23 DIAGNOSIS — I340 Nonrheumatic mitral (valve) insufficiency: Secondary | ICD-10-CM | POA: Diagnosis not present

## 2019-04-23 DIAGNOSIS — E860 Dehydration: Secondary | ICD-10-CM | POA: Diagnosis present

## 2019-04-23 DIAGNOSIS — G4733 Obstructive sleep apnea (adult) (pediatric): Secondary | ICD-10-CM | POA: Diagnosis present

## 2019-04-23 DIAGNOSIS — W19XXXS Unspecified fall, sequela: Secondary | ICD-10-CM | POA: Diagnosis not present

## 2019-04-23 DIAGNOSIS — N189 Chronic kidney disease, unspecified: Secondary | ICD-10-CM | POA: Diagnosis present

## 2019-04-23 DIAGNOSIS — W19XXXA Unspecified fall, initial encounter: Secondary | ICD-10-CM | POA: Diagnosis present

## 2019-04-23 DIAGNOSIS — N183 Chronic kidney disease, stage 3 unspecified: Secondary | ICD-10-CM | POA: Diagnosis present

## 2019-04-23 DIAGNOSIS — Z79899 Other long term (current) drug therapy: Secondary | ICD-10-CM

## 2019-04-23 DIAGNOSIS — M7989 Other specified soft tissue disorders: Secondary | ICD-10-CM | POA: Diagnosis present

## 2019-04-23 DIAGNOSIS — Z8673 Personal history of transient ischemic attack (TIA), and cerebral infarction without residual deficits: Secondary | ICD-10-CM | POA: Diagnosis not present

## 2019-04-23 DIAGNOSIS — R601 Generalized edema: Secondary | ICD-10-CM | POA: Diagnosis present

## 2019-04-23 DIAGNOSIS — E1140 Type 2 diabetes mellitus with diabetic neuropathy, unspecified: Secondary | ICD-10-CM | POA: Diagnosis present

## 2019-04-23 DIAGNOSIS — Z7901 Long term (current) use of anticoagulants: Secondary | ICD-10-CM

## 2019-04-23 DIAGNOSIS — J90 Pleural effusion, not elsewhere classified: Secondary | ICD-10-CM

## 2019-04-23 DIAGNOSIS — L8990 Pressure ulcer of unspecified site, unspecified stage: Secondary | ICD-10-CM | POA: Diagnosis present

## 2019-04-23 DIAGNOSIS — Z91048 Other nonmedicinal substance allergy status: Secondary | ICD-10-CM

## 2019-04-23 LAB — CBC WITH DIFFERENTIAL/PLATELET
Abs Immature Granulocytes: 0.01 10*3/uL (ref 0.00–0.07)
Basophils Absolute: 0 10*3/uL (ref 0.0–0.1)
Basophils Relative: 1 %
Eosinophils Absolute: 0.6 10*3/uL — ABNORMAL HIGH (ref 0.0–0.5)
Eosinophils Relative: 15 %
HCT: 31.5 % — ABNORMAL LOW (ref 36.0–46.0)
Hemoglobin: 9.7 g/dL — ABNORMAL LOW (ref 12.0–15.0)
Immature Granulocytes: 0 %
Lymphocytes Relative: 20 %
Lymphs Abs: 0.9 10*3/uL (ref 0.7–4.0)
MCH: 35.4 pg — ABNORMAL HIGH (ref 26.0–34.0)
MCHC: 30.8 g/dL (ref 30.0–36.0)
MCV: 115 fL — ABNORMAL HIGH (ref 80.0–100.0)
Monocytes Absolute: 0.4 10*3/uL (ref 0.1–1.0)
Monocytes Relative: 9 %
Neutro Abs: 2.4 10*3/uL (ref 1.7–7.7)
Neutrophils Relative %: 55 %
Platelets: 122 10*3/uL — ABNORMAL LOW (ref 150–400)
RBC: 2.74 MIL/uL — ABNORMAL LOW (ref 3.87–5.11)
RDW: 16 % — ABNORMAL HIGH (ref 11.5–15.5)
WBC: 4.3 10*3/uL (ref 4.0–10.5)
nRBC: 0 % (ref 0.0–0.2)

## 2019-04-23 LAB — GLUCOSE, CAPILLARY
Glucose-Capillary: 67 mg/dL — ABNORMAL LOW (ref 70–99)
Glucose-Capillary: 69 mg/dL — ABNORMAL LOW (ref 70–99)

## 2019-04-23 LAB — BRAIN NATRIURETIC PEPTIDE: B Natriuretic Peptide: 584 pg/mL — ABNORMAL HIGH (ref 0.0–100.0)

## 2019-04-23 LAB — BASIC METABOLIC PANEL
Anion gap: 10 (ref 5–15)
BUN: 21 mg/dL (ref 8–23)
CO2: 28 mmol/L (ref 22–32)
Calcium: 8.1 mg/dL — ABNORMAL LOW (ref 8.9–10.3)
Chloride: 95 mmol/L — ABNORMAL LOW (ref 98–111)
Creatinine, Ser: 1.58 mg/dL — ABNORMAL HIGH (ref 0.44–1.00)
GFR calc Af Amer: 35 mL/min — ABNORMAL LOW (ref >60)
GFR calc non Af Amer: 30 mL/min — ABNORMAL LOW (ref >60)
Glucose, Bld: 90 mg/dL (ref 70–99)
Potassium: 3.3 mmol/L — ABNORMAL LOW (ref 3.5–5.1)
Sodium: 133 mmol/L — ABNORMAL LOW (ref 135–145)

## 2019-04-23 LAB — HEPATIC FUNCTION PANEL
ALT: 8 U/L (ref 0–44)
AST: 25 U/L (ref 15–41)
Albumin: 2.4 g/dL — ABNORMAL LOW (ref 3.5–5.0)
Alkaline Phosphatase: 89 U/L (ref 38–126)
Bilirubin, Direct: 0.7 mg/dL — ABNORMAL HIGH (ref 0.0–0.2)
Indirect Bilirubin: 1.2 mg/dL — ABNORMAL HIGH (ref 0.3–0.9)
Total Bilirubin: 1.9 mg/dL — ABNORMAL HIGH (ref 0.3–1.2)
Total Protein: 6.9 g/dL (ref 6.5–8.1)

## 2019-04-23 LAB — PROTIME-INR
INR: 3.9 — ABNORMAL HIGH (ref 0.8–1.2)
Prothrombin Time: 37.9 seconds — ABNORMAL HIGH (ref 11.4–15.2)

## 2019-04-23 MED ORDER — POLYETHYLENE GLYCOL 3350 17 G PO PACK
17.0000 g | PACK | Freq: Every day | ORAL | Status: DC
  Administered 2019-04-24 – 2019-04-27 (×4): 17 g via ORAL
  Filled 2019-04-23 (×4): qty 1

## 2019-04-23 MED ORDER — SODIUM CHLORIDE 0.9 % IV SOLN: 250.0000 mL | INTRAVENOUS | Status: DC | PRN

## 2019-04-23 MED ORDER — POTASSIUM CHLORIDE CRYS ER 20 MEQ PO TBCR
40.0000 meq | EXTENDED_RELEASE_TABLET | Freq: Once | ORAL | Status: AC
  Administered 2019-04-24: 40 meq via ORAL
  Filled 2019-04-23: qty 2

## 2019-04-23 MED ORDER — ONDANSETRON HCL 4 MG/2ML IJ SOLN: 4.0000 mg | Freq: Four times a day (QID) | INTRAMUSCULAR | Status: DC | PRN

## 2019-04-23 MED ORDER — FLUTICASONE PROPIONATE 50 MCG/ACT NA SUSP
1.0000 | Freq: Every day | NASAL | Status: DC
  Administered 2019-04-25 – 2019-04-26 (×2): 1 via NASAL
  Filled 2019-04-23: qty 16

## 2019-04-23 MED ORDER — ONDANSETRON HCL 4 MG PO TABS: 4.0000 mg | ORAL_TABLET | Freq: Four times a day (QID) | ORAL | Status: DC | PRN

## 2019-04-23 MED ORDER — LORATADINE 10 MG PO TABS
10.0000 mg | ORAL_TABLET | Freq: Every day | ORAL | Status: DC
  Administered 2019-04-24 – 2019-04-27 (×4): 10 mg via ORAL
  Filled 2019-04-23 (×4): qty 1

## 2019-04-23 MED ORDER — ENSURE ENLIVE PO LIQD
237.0000 mL | Freq: Two times a day (BID) | ORAL | Status: DC
  Administered 2019-04-24 – 2019-04-27 (×3): 237 mL via ORAL

## 2019-04-23 MED ORDER — HYDROCODONE-ACETAMINOPHEN 10-325 MG PO TABS
1.0000 | ORAL_TABLET | Freq: Two times a day (BID) | ORAL | Status: DC
  Administered 2019-04-24 – 2019-04-27 (×7): 1 via ORAL
  Filled 2019-04-23 (×8): qty 1

## 2019-04-23 MED ORDER — METOPROLOL SUCCINATE ER 50 MG PO TB24: 50.0000 mg | ORAL_TABLET | Freq: Every morning | ORAL | Status: DC

## 2019-04-23 MED ORDER — BISACODYL 5 MG PO TBEC: 5.0000 mg | DELAYED_RELEASE_TABLET | Freq: Every day | ORAL | Status: DC | PRN

## 2019-04-23 MED ORDER — PANTOPRAZOLE SODIUM 40 MG PO TBEC
40.0000 mg | DELAYED_RELEASE_TABLET | Freq: Every day | ORAL | Status: DC
  Administered 2019-04-24 – 2019-04-27 (×4): 40 mg via ORAL
  Filled 2019-04-23 (×4): qty 1

## 2019-04-23 MED ORDER — SODIUM CHLORIDE 0.9% FLUSH
3.0000 mL | Freq: Two times a day (BID) | INTRAVENOUS | Status: DC
  Administered 2019-04-24 – 2019-04-27 (×8): 3 mL via INTRAVENOUS

## 2019-04-23 MED ORDER — SIMVASTATIN 20 MG PO TABS
40.0000 mg | ORAL_TABLET | Freq: Every day | ORAL | Status: DC
  Administered 2019-04-24 – 2019-04-26 (×4): 40 mg via ORAL
  Filled 2019-04-23 (×4): qty 2

## 2019-04-23 MED ORDER — ALPRAZOLAM 0.5 MG PO TABS
0.5000 mg | ORAL_TABLET | Freq: Every evening | ORAL | Status: DC | PRN
  Administered 2019-04-24 – 2019-04-26 (×3): 0.5 mg via ORAL
  Filled 2019-04-23 (×3): qty 1

## 2019-04-23 MED ORDER — SODIUM CHLORIDE 0.9% FLUSH: 3.0000 mL | INTRAVENOUS | Status: DC | PRN

## 2019-04-23 NOTE — Progress Notes (Signed)
Hypoglycemic Event  CBG: 67  Treatment: 4 oz juice/soda  Symptoms: Hungry  Follow-up CBG: Time: 2330  CBG Result: 69  Possible Reasons for Event: Inadequate meal intake  Comments/MD notified: midlevel. Pt stated she had not eaten today and was given frozen meal with soda.    Mary [name]

## 2019-04-23 NOTE — ED Provider Notes (Signed)
[facility] EMERGENCY DEPARTMENT Provider Note   CSN: [id number] Arrival date & time: 04/23/19  1417     History Chief Complaint  Patient presents with  . Wound Check    Mary [name] is a 79 y.o. female.  Patient sent in for a lot of bruising to her right arm.  Also sent in for bruising in her sacral area and buttocks area.  With a little bit of oozing bleeding from that area.  Patient's also stated that she has had difficulty getting around due to all the fluid in her legs.  She also has edema to both arms.  Patient is on Coumadin.  She has had valve replacement as well as has a history of atrial fibrillation.  Also hypertension and diabetes diabetic neuropathy.  Husband seems to respond to her being on some type of diuretic with her primary care doctor trying to get fluid off.  Patient was brought in on January 7 evaluated in the emergency department for bilateral lower extremity edema.  Patient is followed by Coumadin clinic.  Earlier in December she did have a Coumadin level of 5.3.  But more recently it was 4.  Her range probably would be as high as 3.5.  They did hold some of her Coumadin and make some adjustments on her dosing at that last time.  Patient denies any nausea vomiting any fevers any shortness of breath.  Does feel fatigued and generalized weakness.        Past Medical History:  Diagnosis Date  . Anxiety   . Diabetes mellitus with neuropathy ([place])   . Diabetic neuropathy ([place])   . DJD (degenerative joint disease)   . H/O aortic valve replacement   . Hyperlipidemia   . Hypertension   . Neuromuscular disorder (HCC)    neuropathy in feet  . Obesity   . Sleep apnea   . Stroke [facility])    " light stroke"  . Wears glasses     Patient Active Problem List   Diagnosis Date Noted  . Fall 04/23/2019  . S/P right knee arthroscopy 04/13/2015  . OSA (obstructive sleep apnea) 01/01/2013  . Diabetes ([place]) 10/23/2012  . Aortic valve disorder 06/30/2010  . CVA (cerebral  vascular accident) ([place]) 06/30/2010  . S/P aortic valve replacement 06/30/2010  . Hyperlipidemia 09/29/2008  . Obesity 09/29/2008  . Essential hypertension 09/29/2008  . DEGENERATIVE JOINT DISEASE 09/29/2008    Past Surgical History:  Procedure Laterality Date  . ANKLE SURGERY     Left tendon repair  . AORTIC VALVE REPLACEMENT  03/1994  . CARDIAC CATHETERIZATION     [facility]  . CERVICAL SPINE SURGERY    . COLONOSCOPY    . HEMORRHOID SURGERY    . KNEE ARTHROSCOPY Right 04/13/2015   Procedure: RIGHT KNEE ARTHROSCOPY WITH DEBRIDEMENT AND PARTIAL MEDIAL MENISCECTOMY;  Surgeon: Mcarthur Rossetti, MD;  Location: [place];  Service: Orthopedics;  Laterality: Right;  . KNEE ARTHROSCOPY W/ MENISCECTOMY Right 04/13/2015  . LUMBAR FUSION       OB History   No obstetric history on file.     Family History  Problem Relation Age of Onset  . Pneumonia Sister   . Dementia Mother   . Diabetes Sister     Social History   Tobacco Use  . Smoking status: Never Smoker  . Smokeless tobacco: Never Used  Substance Use Topics  . Alcohol use: No    Alcohol/week: 0.0 standard drinks  . Drug use: No  Home Medications Prior to Admission medications   Medication Sig Start Date End Date Taking? Authorizing Provider  acetaminophen (TYLENOL) 500 MG tablet Take 500 mg by mouth 2 (two) times daily as needed for mild pain or moderate pain.    Yes [provider]  ALPRAZolam Duanne Moron) 0.5 MG tablet Take 0.5 mg by mouth at bedtime as needed for sleep.    Yes [provider]  bisacodyl (DULCOLAX) 5 MG EC tablet Take 5 mg by mouth daily as needed for moderate constipation.   Yes [provider]  cetirizine (ZYRTEC) 10 MG tablet Take 10 mg by mouth at bedtime as needed for allergies.  02/16/15  Yes [provider]  fluticasone (FLONASE) 50 MCG/ACT nasal spray Place 1 spray into both nostrils at bedtime.  01/13/15  Yes [provider]    HYDROcodone-acetaminophen (NORCO) 10-325 MG tablet Take 1 tablet by mouth 2 (two) times daily.  12/18/15  Yes [provider]  metolazone (ZAROXOLYN) 10 MG tablet Take 10 mg by mouth every morning. 04/22/19  Yes [provider]  metoprolol succinate (TOPROL-XL) 50 MG 24 hr tablet Take 50 mg by mouth every morning. Take with or immediately following a meal.    Yes [provider]  pantoprazole (PROTONIX) 40 MG tablet Take 1 tablet (40 mg total) by mouth daily before breakfast. 03/21/16  Yes Rehman, Mechele Dawley, MD  polyethylene glycol powder (GLYCOLAX/MIRALAX) 17 GM/SCOOP powder Take 17 g by mouth daily.   Yes [provider]  potassium chloride (KLOR-CON) 10 MEQ tablet Take 10 mEq by mouth every morning. 04/05/19  Yes [provider]  simvastatin (ZOCOR) 40 MG tablet Take 40 mg by mouth daily at 8 pm.  10/16/12  Yes [provider]  torsemide (DEMADEX) 20 MG tablet Take 2 tablets (40 mg total) by mouth 2 (two) times daily. 04/15/19  Yes BranchAlphonse Guild, MD  Vitamin D, Ergocalciferol, (DRISDOL) 1.25 MG (50000 UT) CAPS capsule Take 50,000 Units by mouth every Sunday.    Yes [provider]  warfarin (COUMADIN) 2 MG tablet Take 1 tablet daily or as directed by Coumadin clinic Patient taking differently: Take 1-2 mg by mouth See admin instructions. 2mg  on Sundays. Takes 1mg  on all other days. Takes in the morning 09/17/18  Yes Branch, Alphonse Guild, MD  metoprolol succinate (TOPROL XL) 25 MG 24 hr tablet Take 1 tablet (25 mg total) by mouth daily. Patient not taking: Reported on 04/23/2019 01/21/19   Arnoldo Lenis, MD    Allergies    Tape  Review of Systems   Review of Systems  Constitutional: Positive for fatigue. Negative for chills and fever.  HENT: Negative for rhinorrhea and sore throat.   Eyes: Negative for visual disturbance.  Respiratory: Negative for cough and shortness of breath.   Cardiovascular: Positive for leg swelling. Negative  for chest pain.  Gastrointestinal: Negative for abdominal pain, diarrhea, nausea and vomiting.  Genitourinary: Negative for dysuria.  Musculoskeletal: Negative for back pain and neck pain.  Skin: Negative for rash.  Neurological: Positive for weakness. Negative for dizziness, light-headedness and headaches.  Hematological: Bruises/bleeds easily.  Psychiatric/Behavioral: Negative for confusion.    Physical Exam Updated Vital Signs BP 114/65   Pulse 68   Temp 97.6 F (36.4 C) (Oral)   Resp 18   Ht 1.778 m (5\' 10" )   Wt 79 kg   SpO2 95%   BMI 24.99 kg/m   Physical Exam Vitals and nursing note reviewed.  Constitutional:  General: She is not in acute distress.    Appearance: She is well-developed.  HENT:     Head: Normocephalic and atraumatic.  Eyes:     Extraocular Movements: Extraocular movements intact.     Conjunctiva/sclera: Conjunctivae normal.     Pupils: Pupils are equal, round, and reactive to light.  Cardiovascular:     Rate and Rhythm: Normal rate and regular rhythm.     Heart sounds: No murmur.  Pulmonary:     Effort: Pulmonary effort is normal. No respiratory distress.     Breath sounds: Normal breath sounds.  Abdominal:     Palpations: Abdomen is soft.     Tenderness: There is no abdominal tenderness.  Musculoskeletal:     Cervical back: Neck supple.     Right lower leg: Edema present.     Left lower leg: Edema present.     Comments: Patient also with edema to upper extremities and trunk area.  Right upper extremity has a small 1 cm elliptical skin tear with a lot of ecchymotic skin bruising below that.  Patient has a lot of soft tissue edema but no evidence of any significant hematoma.  No active bleeding.  Patient also in the sacral and bilateral buttocks area with a band of ecchymotic bruising.  And there is some oozing of clear fluid and little bit of blood-tinged fluid from a skin tear there.  Bilateral lower extremities with a lot of edema there is  also edema to her abdominal wall as well.  Skin:    General: Skin is warm and dry.  Neurological:     General: No focal deficit present.     Mental Status: She is alert and oriented to person, place, and time.     Comments: Patient able to stand and weight-bear but has a lot of difficulty with moving the extremities probably from the edematous fluid making skin tight and also making legs very heavy.     ED Results / Procedures / Treatments   Labs (all labs ordered are listed, but only abnormal results are displayed) Labs Reviewed  PROTIME-INR - Abnormal; Notable for the following components:      Result Value   Prothrombin Time 37.9 (*)    INR 3.9 (*)    All other components within normal limits  CBC WITH DIFFERENTIAL/PLATELET - Abnormal; Notable for the following components:   RBC 2.74 (*)    Hemoglobin 9.7 (*)    HCT 31.5 (*)    MCV 115.0 (*)    MCH 35.4 (*)    RDW 16.0 (*)    Platelets 122 (*)    Eosinophils Absolute 0.6 (*)    All other components within normal limits  BASIC METABOLIC PANEL - Abnormal; Notable for the following components:   Sodium 133 (*)    Potassium 3.3 (*)    Chloride 95 (*)    Creatinine, Ser 1.58 (*)    Calcium 8.1 (*)    GFR calc non Af Amer 30 (*)    GFR calc Af Amer 35 (*)    All other components within normal limits  HEPATIC FUNCTION PANEL - Abnormal; Notable for the following components:   Albumin 2.4 (*)    Total Bilirubin 1.9 (*)    Bilirubin, Direct 0.7 (*)    Indirect Bilirubin 1.2 (*)    All other components within normal limits  BRAIN NATRIURETIC PEPTIDE - Abnormal; Notable for the following components:   B Natriuretic Peptide 584.0 (*)  All other components within normal limits  SARS CORONAVIRUS 2 (TAT 6-24 HRS)  PROTIME-INR    EKG None  Radiology DG Chest Port 1 View  Result Date: 04/23/2019 CLINICAL DATA:  Edema.  Bleeding wound. EXAM: PORTABLE CHEST 1 VIEW COMPARISON:  03/27/2019 FINDINGS: Prior median sternotomy.  Stable blunting of the right costophrenic angle compatible with moderate right pleural effusion, with associated passive atelectasis. Mild enlargement of the cardiopericardial silhouette. Scarring or atelectasis in the right middle lobe and lingula. IMPRESSION: 1. Stable appearance the chest with at least a moderate right pleural effusion and bibasilar atelectasis or scarring. 2. Stable mild cardiomegaly. Electronically Signed   By: Van Clines M.D.   On: 04/23/2019 18:59    Procedures Procedures (including critical care time)  Medications Ordered in ED Medications  potassium chloride SA (KLOR-CON) CR tablet 40 mEq (has no administration in time range)    ED Course  I have reviewed the triage vital signs and the nursing notes.  Pertinent labs & imaging results that were available during my care of the patient were reviewed by me and considered in my medical decision making (see chart for details).    MDM Rules/Calculators/A&P                     Work-up shows evidence of a worsening pleural effusion.  Is now moderate.  BNP also elevated in the 500s.  But no evidence of pulmonary edema.  INR still slightly elevated but at 3.9.  Discussed with hospitalist they will admit patient may have some underlying pulmonary edema CT chest was done.  Consideration for some diuresis it appears that the whole body edema is mostly due to low albumin.  Did not recommend giving her Lasix at this point in time because it may not help remove the edema from the soft tissue area.  Discussed with Dr. Darrick Meigs he will admit.  Covid testing is pending.    Final Clinical Impression(s) / ED Diagnoses Final diagnoses:  Edema, unspecified type  Pleural effusion on right  Coagulopathy [facility])    Rx / DC Orders ED Discharge Orders    None       Fredia Sorrow, MD 04/23/19 2157

## 2019-04-23 NOTE — H&P (Signed)
TRH H&P    Patient Demographics:    Mary [name], is a 79 y.o. female  MRN: [id number]  DOB - Feb 14, 1941  Admit Date - 04/23/2019  Referring MD/NP/PA: Aundria Mems  Outpatient Primary MD for the patient is [name], MD  Patient coming from: Home  Chief complaint-leg swelling   HPI:    Mary [name]  is a 79 y.o. female, with history of aortic valve replacement, metal valve placed in 1995/96, paroxysmal atrial fibrillation, hypertension, hyperlipidemia came to hospital with complaints of worsening leg swelling, fatigue, bleeding sacral wound.  Patient denies shortness of breath, complains of intermittent chest pain.  Denies nausea vomiting or diarrhea.  No abdominal pain.  Patient says that she fell few weeks ago and has felt generally weak after that.  She has had difficulty walking. In the ED, BNP was elevated to 584, chest x-ray showed stable appearance of the chest with at least moderate right pleural effusion and bibasilar atelectasis or scarring. Lab work showed creatinine 1.58, worse from previous creatinine of 1.43 on 03/27/2019. Patient is not requiring oxygen.  She is not short of breath. SARS-CoV-2 is currently pending. Denies fever or chills.    Review of systems:    In addition to the HPI above,    All other systems reviewed and are negative.    Past History of the following :    Past Medical History:  Diagnosis Date  . Anxiety   . Diabetes mellitus with neuropathy ([place])   . Diabetic neuropathy ([place])   . DJD (degenerative joint disease)   . H/O aortic valve replacement   . Hyperlipidemia   . Hypertension   . Neuromuscular disorder (HCC)    neuropathy in feet  . Obesity   . Sleep apnea   . 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 Wears glasses       Past Surgical History:  Procedure Laterality Date  . ANKLE SURGERY     Left tendon repair  . AORTIC VALVE REPLACEMENT  03/1994  .  CARDIAC CATHETERIZATION     1996 [facility]  . CERVICAL SPINE SURGERY    . COLONOSCOPY    . HEMORRHOID SURGERY    . KNEE ARTHROSCOPY Right 04/13/2015   Procedure: RIGHT KNEE ARTHROSCOPY WITH DEBRIDEMENT AND PARTIAL MEDIAL MENISCECTOMY;  Surgeon: Mcarthur Rossetti, MD;  Location: [facility];  Service: Orthopedics;  Laterality: Right;  . KNEE ARTHROSCOPY W/ MENISCECTOMY Right 04/13/2015  . LUMBAR FUSION        Social History:      Social History   Tobacco Use  . Smoking status: Never Smoker  . Smokeless tobacco: Never Used  Substance Use Topics  . Alcohol use: No    Alcohol/week: 0.0 standard drinks       Family History :     Family History  Problem Relation Age of Onset  . Pneumonia Sister   . Dementia Mother   . Diabetes Sister       Home Medications:   Prior to Admission medications  Medication Sig Start Date End Date Taking? Authorizing Provider  acetaminophen (TYLENOL) 500 MG tablet Take 500 mg by mouth 2 (two) times daily as needed for mild pain or moderate pain.    Yes [provider]  ALPRAZolam Duanne Moron) 0.5 MG tablet Take 0.5 mg by mouth at bedtime as needed for sleep.    Yes [provider]  bisacodyl (DULCOLAX) 5 MG EC tablet Take 5 mg by mouth daily as needed for moderate constipation.   Yes [provider]  cetirizine (ZYRTEC) 10 MG tablet Take 10 mg by mouth at bedtime as needed for allergies.  02/16/15  Yes [provider]  fluticasone (FLONASE) 50 MCG/ACT nasal spray Place 1 spray into both nostrils at bedtime.  01/13/15  Yes [provider]  HYDROcodone-acetaminophen (NORCO) 10-325 MG tablet Take 1 tablet by mouth 2 (two) times daily.  12/18/15  Yes [provider]  metolazone (ZAROXOLYN) 10 MG tablet Take 10 mg by mouth every morning. 04/22/19  Yes [provider]  metoprolol succinate (TOPROL-XL) 50 MG 24 hr tablet Take 50 mg by mouth every morning. Take with or immediately following a meal.     Yes [provider]  pantoprazole (PROTONIX) 40 MG tablet Take 1 tablet (40 mg total) by mouth daily before breakfast. 03/21/16  Yes Rehman, Mechele Dawley, MD  polyethylene glycol powder (GLYCOLAX/MIRALAX) 17 GM/SCOOP powder Take 17 g by mouth daily.   Yes [provider]  potassium chloride (KLOR-CON) 10 MEQ tablet Take 10 mEq by mouth every morning. 04/05/19  Yes [provider]  simvastatin (ZOCOR) 40 MG tablet Take 40 mg by mouth daily at 8 pm.  10/16/12  Yes [provider]  torsemide (DEMADEX) 20 MG tablet Take 2 tablets (40 mg total) by mouth 2 (two) times daily. 04/15/19  Yes BranchAlphonse Guild, MD  Vitamin D, Ergocalciferol, (DRISDOL) 1.25 MG (50000 UT) CAPS capsule Take 50,000 Units by mouth every Sunday.    Yes [provider]  warfarin (COUMADIN) 2 MG tablet Take 1 tablet daily or as directed by Coumadin clinic Patient taking differently: Take 1-2 mg by mouth See admin instructions. 2mg  on Sundays. Takes 1mg  on all other days. Takes in the morning 09/17/18  Yes Branch, Alphonse Guild, MD  metoprolol succinate (TOPROL XL) 25 MG 24 hr tablet Take 1 tablet (25 mg total) by mouth daily. Patient not taking: Reported on 04/23/2019 01/21/19   Arnoldo Lenis, MD     Allergies:     Allergies  Allergen Reactions  . Tape Itching    Redness, Please use "paper" tape only     Physical Exam:   Vitals  Blood pressure 114/65, pulse 68, temperature 97.6 F (36.4 C), temperature source Oral, resp. rate 18, height 5\' 10"  (1.778 m), weight 79 kg, SpO2 95 %.  1.  General: Appears in no acute distress  2. Psychiatric: Alert, oriented x3, intact insight and judgment  3. Neurologic: Cranial nerves II through XII grossly intact, motor strength 5/5 in all extremities  4. HEENMT:  Atraumatic normocephalic, extraocular muscles are intact  5. Respiratory : Crackles auscultated right lung base  6. Cardiovascular : S1-S2, regular, ejection systolic murmur  auscultated at the mitral area, 1+ bilateral leg swelling noted  7. Gastrointestinal:  Abdomen is soft, nontender, no organomegaly     Data Review:    CBC Recent Labs  Lab 04/23/19 1602  WBC 4.3  HGB 9.7*  HCT 31.5*  PLT 122*  MCV 115.0[facility]  35.4*  MCHC 30.8  RDW 16.0*  LYMPHSABS 0.9  MONOABS 0.4  EOSABS 0.6*  BASOSABS 0.0   ------------------------------------------------------------------------------------------------------------------  Results for orders placed or performed during the hospital encounter of 04/23/19 (from the past 48 hour(s))  Protime-INR     Status: Abnormal   Collection Time: 04/23/19  4:02 PM  Result Value Ref Range   Prothrombin Time 37.9 (H) 11.4 - 15.2 seconds   INR 3.9 (H) 0.8 - 1.2    Comment: (NOTE) INR goal varies based on device and disease states. Performed at [facility], [address], [place], [place] [address]   CBC with Differential/Platelet     Status: Abnormal   Collection Time: 04/23/19  4:02 PM  Result Value Ref Range   WBC 4.3 4.0 - 10.5 K/uL   RBC 2.74 (L) 3.87 - 5.11 MIL/uL   Hemoglobin 9.7 (L) 12.0 - 15.0 g/dL   HCT 31.5 (L) 36.0 - 46.0 %   MCV 115.0 (H) 80.0 - 100.0 fL   MCH 35.4 (H) 26.0 - 34.0 pg   MCHC 30.8 30.0 - 36.0 g/dL   RDW 16.0 (H) 11.5 - 15.5 %   Platelets 122 (L) 150 - 400 K/uL   nRBC 0.0 0.0 - 0.2 %   Neutrophils Relative % 55 %   Neutro Abs 2.4 1.7 - 7.7 K/uL   Lymphocytes Relative 20 %   Lymphs Abs 0.9 0.7 - 4.0 K/uL   Monocytes Relative 9 %   Monocytes Absolute 0.4 0.1 - 1.0 K/uL   Eosinophils Relative 15 %   Eosinophils Absolute 0.6 (H) 0.0 - 0.5 K/uL   Basophils Relative 1 %   Basophils Absolute 0.0 0.0 - 0.1 K/uL   Immature Granulocytes 0 %   Abs Immature Granulocytes 0.01 0.00 - 0.07 K/uL   Reactive, Benign Lymphocytes PRESENT     Comment: Performed at [facility], [address], [place], [place] [id number]  Basic metabolic panel     Status: Abnormal   Collection Time: 04/23/19  4:02  PM  Result Value Ref Range   Sodium 133 (L) 135 - 145 mmol/L   Potassium 3.3 (L) 3.5 - 5.1 mmol/L   Chloride 95 (L) 98 - 111 mmol/L   CO2 28 22 - 32 mmol/L   Glucose, Bld 90 70 - 99 mg/dL   BUN 21 8 - 23 mg/dL   Creatinine, Ser 1.58 (H) 0.44 - 1.00 mg/dL   Calcium 8.1 (L) 8.9 - 10.3 mg/dL   GFR calc non Af Amer 30 (L) >60 mL/min   GFR calc Af Amer 35 (L) >60 mL/min   Anion gap 10 5 - 15    Comment: Performed at [facility], [address], [place], [place] [address]  Hepatic function panel     Status: Abnormal   Collection Time: 04/23/19  4:02 PM  Result Value Ref Range   Total Protein 6.9 6.5 - 8.1 g/dL   Albumin 2.4 (L) 3.5 - 5.0 g/dL   AST 25 15 - 41 U/L   ALT 8 0 - 44 U/L   Alkaline Phosphatase 89 38 - 126 U/L   Total Bilirubin 1.9 (H) 0.3 - 1.2 mg/dL   Bilirubin, Direct 0.7 (H) 0.0 - 0.2 mg/dL   Indirect Bilirubin 1.2 (H) 0.3 - 0.9 mg/dL    Comment: Performed at [facility], [address], [place], [place] [address]  Brain natriuretic peptide     Status: Abnormal   Collection Time: 04/23/19  4:05 PM  Result  Value Ref Range   B Natriuretic Peptide 584.0 (H) 0.0 - 100.0 pg/mL    Comment: Performed at [facility], [address], [place], [place] [address]    Chemistries  Recent Labs  Lab 04/23/19 1602  NA 133*  K 3.3*  CL 95*  CO2 28  GLUCOSE 90  BUN 21  CREATININE 1.58*  CALCIUM 8.1*  AST 25  ALT 8  ALKPHOS 89  BILITOT 1.9*   ------------------------------------------------------------------------------------------------------------------  ------------------------------------------------------------------------------------------------------------------ GFR: Estimated Creatinine Clearance: 30.2 mL/min (A) (by C-G formula based on SCr of 1.58 mg/dL (H)). Liver Function Tests: Recent Labs  Lab 04/23/19 1602  AST 25  ALT 8  ALKPHOS 89  BILITOT 1.9*  PROT 6.9  ALBUMIN 2.4*   No results for input(s): LIPASE, AMYLASE in the last 168 hours. No results  for input(s): AMMONIA in the last 168 hours. Coagulation Profile: Recent Labs  Lab 04/23/19 1602  INR 3.9*   Cardiac Enzymes: No results for input(s): CKTOTAL, CKMB, CKMBINDEX, TROPONINI in the last 168 hours. BNP (last 3 results) No results for input(s): PROBNP in the last 8760 hours. HbA1C: No results for input(s): HGBA1C in the last 72 hours. CBG: No results for input(s): GLUCAP in the last 168 hours. Lipid Profile: No results for input(s): CHOL, HDL, LDLCALC, TRIG, CHOLHDL, LDLDIRECT in the last 72 hours. Thyroid Function Tests: No results for input(s): TSH, T4TOTAL, FREET4, T3FREE, THYROIDAB in the last 72 hours. Anemia Panel: No results for input(s): VITAMINB12, FOLATE, FERRITIN, TIBC, IRON, RETICCTPCT in the last 72 hours.  --------------------------------------------------------------------------------------------------------------- Urine analysis:    Component Value Date/Time   COLORURINE AMBER BIOCHEMICALS MAY BE AFFECTED BY COLOR (A) 11/01/2007 1518   APPEARANCEUR CLEAR 11/01/2007 1518   LABSPEC 1.025 11/01/2007 1518   PHURINE 6.0 11/01/2007 1518   GLUCOSEU NEGATIVE 11/01/2007 1518   HGBUR NEGATIVE 11/01/2007 1518   BILIRUBINUR NEGATIVE 11/01/2007 1518   KETONESUR TRACE (A) 11/01/2007 1518   PROTEINUR NEGATIVE 11/01/2007 1518   UROBILINOGEN 2.0 (H) 11/01/2007 1518   NITRITE NEGATIVE 11/01/2007 1518   LEUKOCYTESUR SMALL (A) 11/01/2007 1518      Imaging Results:    DG Chest Port 1 View  Result Date: 04/23/2019 CLINICAL DATA:  Edema.  Bleeding wound. EXAM: PORTABLE CHEST 1 VIEW COMPARISON:  03/27/2019 FINDINGS: Prior median sternotomy. Stable blunting of the right costophrenic angle compatible with moderate right pleural effusion, with associated passive atelectasis. Mild enlargement of the cardiopericardial silhouette. Scarring or atelectasis in the right middle lobe and lingula. IMPRESSION: 1. Stable appearance the chest with at least a moderate right pleural  effusion and bibasilar atelectasis or scarring. 2. Stable mild cardiomegaly. Electronically Signed   By: Van Clines M.D.   On: 04/23/2019 18:59      Assessment & Plan:    Active Problems:   Fall   1. Bilateral leg swelling-patient has bilateral 1+ pitting edema, last echo from 2017 showed EF 55 to 60%.  Patient has been on metolazone and Demadex at home.  BNP is 554.0.  Patient does not look like fluid overload, she has dry mouth mucous membranes.  She is currently not requiring oxygen.  Has chronic right pleural effusion.  She has low albumin 2.4.  Creatinine is worse today 1.58, her baseline is around 1.43 as of 03/27/2019.  I will hold on diuretics at this time.  Her leg swelling seems to be from anasarca.  Will consult cardiology in a.m. for further recommendations. 2. Acute kidney injury-patient's baseline creatinine 1.43 as of 03/27/2019.  Today presenting with creatinine 1.58.  Likely from overdiuresis.  Will hold diuretics.  Hold metolazone and Demadex.  Follow BMP in a.m. 3. Fall-patient says that she fell 2 weeks ago and has been feeling generally weak from that point of time.  Will obtain PT evaluation in a.m. 4. Mechanical arctic valve-patient has history of aortic valve replacement, has mechanical aortic valve.  Will consult pharmacy for Coumadin dosing. 5. Hypertension-continue Toprol-XL. 6. Hypertension lipidemia-continue statin 7. Paroxysmal atrial fibrillation-continue Toprol-XL, Coumadin.  Heart rate is controlled.    DVT Prophylaxis-   Coumadin  AM Labs Ordered, also please review Full Orders  Family Communication: Admission, patients condition and plan of care including tests being ordered have been discussed with the patient and  who indicate understanding and agree with the plan and Code Status.  Code Status: Full code  Admission status: Inpatient: Based on patients clinical presentation and evaluation of above clinical data, I have made determination that  patient meets Inpatient criteria at this time.  Time spent in minutes : 60 minutes   Lauriel Helin S Kynlie Jane M.D

## 2019-04-23 NOTE — ED Triage Notes (Signed)
Pt reports sacral "wound" that is bleeding last several weeks. Pt reports "I have one on my arm as well." pt denies pain, n/v/fever.

## 2019-04-23 NOTE — Progress Notes (Signed)
ANTICOAGULATION CONSULT NOTE - Initial Consult  Pharmacy Consult for Coumadin  Indication: atrial fibrillation/AVR  Allergies  Allergen Reactions  . Tape Itching    Redness, Please use "paper" tape only    Patient Measurements: Height: 5\' 10"  (177.8 cm) Weight: 174 lb 2.6 oz (79 kg) IBW/kg (Calculated) : 68.5 Heparin Dosing Weight:   Vital Signs: Temp: 97.6 F (36.4 C) (02/03 1740) Temp Source: Oral (02/03 1740) BP: 114/65 (02/03 1900) Pulse Rate: 68 (02/03 1900)  Labs: Recent Labs    04/23/19 1602  HGB 9.7*  HCT 31.5*  PLT 122*  LABPROT 37.9*  INR 3.9*  CREATININE 1.58*    Estimated Creatinine Clearance: 30.2 mL/min (A) (by C-G formula based on SCr of 1.58 mg/dL (H)).   Medical History: Past Medical History:  Diagnosis Date  . Anxiety   . Diabetes mellitus with neuropathy ([place])   . Diabetic neuropathy ([place])   . DJD (degenerative joint disease)   . H/O aortic valve replacement   . Hyperlipidemia   . Hypertension   . Neuromuscular disorder (HCC)    neuropathy in feet  . Obesity   . Sleep apnea   . 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 Wears glasses     Medications:  (Not in a hospital admission)  Scheduled:  . potassium chloride  40 mEq Oral Once   Infusions:    Assessment: Ar[place] presented with sacral wound issue (bleeding) for the last several weeks. She has been on coumadin for AVR/afib. She has taken the dose today. Her INR today is 3.9 (2.5-3.5).   PTA dose: 1mg  qday except 2mg  Sunday  Goal of Therapy:  INR 2.5-3.5 Monitor platelets by anticoagulation protocol: Yes   Plan:  Daily INR  Onnie Boer, PharmD, BCIDP, AAHIVP, CPP Infectious Disease Pharmacist 04/23/2019 9:34 PM

## 2019-04-24 ENCOUNTER — Inpatient Hospital Stay (HOSPITAL_COMMUNITY): Payer: Medicare Other

## 2019-04-24 DIAGNOSIS — N179 Acute kidney failure, unspecified: Secondary | ICD-10-CM

## 2019-04-24 DIAGNOSIS — L8990 Pressure ulcer of unspecified site, unspecified stage: Secondary | ICD-10-CM | POA: Insufficient documentation

## 2019-04-24 DIAGNOSIS — I5033 Acute on chronic diastolic (congestive) heart failure: Secondary | ICD-10-CM

## 2019-04-24 DIAGNOSIS — I340 Nonrheumatic mitral (valve) insufficiency: Secondary | ICD-10-CM

## 2019-04-24 DIAGNOSIS — I361 Nonrheumatic tricuspid (valve) insufficiency: Secondary | ICD-10-CM

## 2019-04-24 LAB — COMPREHENSIVE METABOLIC PANEL
ALT: 7 U/L (ref 0–44)
AST: 21 U/L (ref 15–41)
Albumin: 2.3 g/dL — ABNORMAL LOW (ref 3.5–5.0)
Alkaline Phosphatase: 82 U/L (ref 38–126)
Anion gap: 12 (ref 5–15)
BUN: 21 mg/dL (ref 8–23)
CO2: 28 mmol/L (ref 22–32)
Calcium: 8.5 mg/dL — ABNORMAL LOW (ref 8.9–10.3)
Chloride: 97 mmol/L — ABNORMAL LOW (ref 98–111)
Creatinine, Ser: 1.41 mg/dL — ABNORMAL HIGH (ref 0.44–1.00)
GFR calc Af Amer: 40 mL/min — ABNORMAL LOW (ref >60)
GFR calc non Af Amer: 35 mL/min — ABNORMAL LOW (ref >60)
Glucose, Bld: 87 mg/dL (ref 70–99)
Potassium: 3.5 mmol/L (ref 3.5–5.1)
Sodium: 137 mmol/L (ref 135–145)
Total Bilirubin: 1.9 mg/dL — ABNORMAL HIGH (ref 0.3–1.2)
Total Protein: 6.4 g/dL — ABNORMAL LOW (ref 6.5–8.1)

## 2019-04-24 LAB — SARS CORONAVIRUS 2 (TAT 6-24 HRS): SARS Coronavirus 2: NEGATIVE

## 2019-04-24 LAB — CBC
HCT: 29.3 % — ABNORMAL LOW (ref 36.0–46.0)
Hemoglobin: 9.2 g/dL — ABNORMAL LOW (ref 12.0–15.0)
MCH: 35.4 pg — ABNORMAL HIGH (ref 26.0–34.0)
MCHC: 31.4 g/dL (ref 30.0–36.0)
MCV: 112.7 fL — ABNORMAL HIGH (ref 80.0–100.0)
Platelets: 125 10*3/uL — ABNORMAL LOW (ref 150–400)
RBC: 2.6 MIL/uL — ABNORMAL LOW (ref 3.87–5.11)
RDW: 16 % — ABNORMAL HIGH (ref 11.5–15.5)
WBC: 4.5 10*3/uL (ref 4.0–10.5)
nRBC: 0 % (ref 0.0–0.2)

## 2019-04-24 LAB — GLUCOSE, CAPILLARY
Glucose-Capillary: 98 mg/dL (ref 70–99)
Glucose-Capillary: 77 mg/dL (ref 70–99)
Glucose-Capillary: 107 mg/dL — ABNORMAL HIGH (ref 70–99)
Glucose-Capillary: 96 mg/dL (ref 70–99)
Glucose-Capillary: 106 mg/dL — ABNORMAL HIGH (ref 70–99)

## 2019-04-24 LAB — ECHOCARDIOGRAM COMPLETE
Height: 70 in
Weight: 2786.61 oz

## 2019-04-24 LAB — PROTIME-INR
INR: 3.5 — ABNORMAL HIGH (ref 0.8–1.2)
Prothrombin Time: 35.4 seconds — ABNORMAL HIGH (ref 11.4–15.2)

## 2019-04-24 MED ORDER — ADULT MULTIVITAMIN W/MINERALS CH
1.0000 | ORAL_TABLET | Freq: Every day | ORAL | Status: DC
  Administered 2019-04-24 – 2019-04-27 (×4): 1 via ORAL
  Filled 2019-04-24 (×4): qty 1

## 2019-04-24 MED ORDER — METOPROLOL SUCCINATE ER 50 MG PO TB24
100.0000 mg | ORAL_TABLET | Freq: Every morning | ORAL | Status: DC
  Administered 2019-04-24 – 2019-04-27 (×4): 100 mg via ORAL
  Filled 2019-04-24 (×4): qty 2

## 2019-04-24 NOTE — Progress Notes (Signed)
*  PRELIMINARY RESULTS* Echocardiogram 2D Echocardiogram has been performed.  Leavy Cella 04/24/2019, 10:03 AM

## 2019-04-24 NOTE — Progress Notes (Addendum)
PROGRESS NOTE    Mary [name]  [id number] DOB: 22-Jun-1940 DOA: 04/23/2019 PCP: [name], MD    Brief Narrative:  This 79 year old lady was admitted to the hospital yesterday because of a fall.  She had been started on high-dose metolazone by her primary care physician approximately a week ago.  She had degree of dehydration and probably hypovolemia leading to the fall.  However she does seem to have bilateral leg edema.  Diuretics have been held for the time being.   Assessment & Plan:   Active Problems:   Fall   Pressure injury of skin   1.  Bilateral leg swelling with pitting edema.  Hold diuretics for the time being.  Patient is undergoing repeat echocardiogram.  Cardiology is following. 2.  Acute kidney injury.  Baseline creatinine is around 1.43.  It has increased yesterday but is on the way to improvement since diuretics have been held. 3.  Fall.  Physical therapy has seen and recommend home health physical therapy. 4.  Mechanical aortic valve.  Continue with warfarin. 5.  Hypertension.  Continue with Toprol-XL. 6.  Paroxysmal atrial fibrillation.  Continue with Toprol-XL and warfarin.  Ventricular rate is controlled.   DVT prophylaxis: Coumadin. Code Status: Full code. Family Communication: Discussed with patient at the bedside.  I have updated the patient's husband also by telephone today. Disposition Plan: The patient was admitted from home.  The plan is for her to return back to home.  I think that once her electrolyte derangement is resolved and medications were adjusted, she should be medically stable for discharge, anticipate this in the next couple of days.   Consultants:   Cardiology.  Procedures:   Echocardiogram today.  Antimicrobials:   None.   Subjective: She says she is feeling somewhat better than yesterday but still rather weak.  No dyspnea.  Objective: Vitals:   04/23/19 2145 04/23/19 2200 04/23/19 2248 04/24/19 0508  BP:  (!) 111/56  134/66 (!) 109/52  Pulse: 70 71 76 71  Resp:   16 16  Temp:   98.1 F (36.7 C) 97.7 F (36.5 C)  TempSrc:      SpO2: 95% 96% 99% 95%  Weight:      Height:        Intake/Output Summary (Last 24 hours) at 04/24/2019 0940 Last data filed at 04/24/2019 0900 Gross per 24 hour  Intake 480 ml  Output --  Net 480 ml   Filed Weights   04/23/19 1457  Weight: 79 kg    Examination:  She looks chronically unwell.  Blood pressure soft.  Ventricular rate is controlled and in atrial fibrillation.  Remains with peripheral pitting edema bilaterally.  Lung fields are clear.  Alert and orientated.   Data Reviewed: I have personally reviewed following labs and imaging studies  CBC: Recent Labs  Lab 04/23/19 1602 04/24/19 0408  WBC 4.3 4.5  NEUTROABS 2.4  --   HGB 9.7* 9.2*  HCT 31.5* 29.3*  MCV 115.0* 112.7*  PLT 122* 0000000*   Basic Metabolic Panel: Recent Labs  Lab 04/23/19 1602 04/24/19 0408  NA 133* 137  K 3.3* 3.5  CL 95* 97*  CO2 28 28  GLUCOSE 90 87  BUN 21 21  CREATININE 1.58* 1.41*  CALCIUM 8.1* 8.5*   GFR: Estimated Creatinine Clearance: 33.8 mL/min (A) (by C-G formula based on SCr of 1.41 mg/dL (H)). Liver Function Tests: Recent Labs  Lab 04/23/19 1602 04/24/19 0408  AST 25 21  ALT 8  7  ALKPHOS 89 82  BILITOT 1.9* 1.9*  PROT 6.9 6.4*  ALBUMIN 2.4* 2.3*   No results for input(s): LIPASE, AMYLASE in the last 168 hours. No results for input(s): AMMONIA in the last 168 hours. Coagulation Profile: Recent Labs  Lab 04/23/19 1602  INR 3.9*   Cardiac Enzymes: No results for input(s): CKTOTAL, CKMB, CKMBINDEX, TROPONINI in the last 168 hours. BNP (last 3 results) No results for input(s): PROBNP in the last 8760 hours. HbA1C: No results for input(s): HGBA1C in the last 72 hours. CBG: Recent Labs  Lab 04/23/19 2300 04/23/19 2330 04/24/19 0101 04/24/19 0726  GLUCAP 67* 69* 98 77   Lipid Profile: No results for input(s): CHOL, HDL, LDLCALC, TRIG,  CHOLHDL, LDLDIRECT in the last 72 hours. Thyroid Function Tests: No results for input(s): TSH, T4TOTAL, FREET4, T3FREE, THYROIDAB in the last 72 hours. Anemia Panel: No results for input(s): VITAMINB12, FOLATE, FERRITIN, TIBC, IRON, RETICCTPCT in the last 72 hours. Sepsis Labs: No results for input(s): PROCALCITON, LATICACIDVEN in the last 168 hours.  No results found for this or any previous visit (from the past 240 hour(s)).       Radiology Studies: DG Chest Port 1 View  Result Date: 04/23/2019 CLINICAL DATA:  Edema.  Bleeding wound. EXAM: PORTABLE CHEST 1 VIEW COMPARISON:  03/27/2019 FINDINGS: Prior median sternotomy. Stable blunting of the right costophrenic angle compatible with moderate right pleural effusion, with associated passive atelectasis. Mild enlargement of the cardiopericardial silhouette. Scarring or atelectasis in the right middle lobe and lingula. IMPRESSION: 1. Stable appearance the chest with at least a moderate right pleural effusion and bibasilar atelectasis or scarring. 2. Stable mild cardiomegaly. Electronically Signed   By: Van Clines M.D.   On: 04/23/2019 18:59        Scheduled Meds: . feeding supplement (ENSURE ENLIVE)  237 mL Oral BID BM  . fluticasone  1 spray Each Nare QHS  . HYDROcodone-acetaminophen  1 tablet Oral BID  . loratadine  10 mg Oral Daily  . metoprolol succinate  100 mg Oral q morning - 10a  . pantoprazole  40 mg Oral QAC breakfast  . polyethylene glycol  17 g Oral Daily  . simvastatin  40 mg Oral Q2000  . sodium chloride flush  3 mL Intravenous Q12H   Continuous Infusions: . sodium chloride       LOS: 1 day       Azazel Franze Luther Parody, MD [facility]   If 7PM-7AM, please contact night-coverage [email or web address]  04/24/2019, 9:40 AM

## 2019-04-24 NOTE — Consult Note (Addendum)
Cardiology Consult    Patient ID: Mary [name]; SK:[id number]; 09/28/1940   Admit date: 04/23/2019 Date of Consult: 04/24/2019  Primary Care Provider: [name], MD Primary Cardiologist: Carlyle Dolly, MD    Patient Profile    Mary [name] is a 79 y.o. female with past medical history of persistent atrial fibrillation, chronic diastolic CHF, recurrent pleural effusions, aortic valve replacement (s/p St. Jude AVR in 1995), HTN, HLD, history of CVA and orthostatic hypotension who is being seen today for the evaluation of CHF at the request of Dr. Darrick Meigs.   History of Present Illness    [name] was last examined by Dr. Harl Bowie in 01/2019 and recent monitor had shown episodes of atrial fibrillation but heart rate was overall well controlled, therefore she was continued on Toprol XL 100 mg daily with instructions to take an extra 25mg  at night if needed. She reported increased dyspnea on exertion along with lower extremity edema (weight at 177 lbs) and it appears she was continued on Torsemide 40mg  daily.   She presented to [facility] ED on 03/27/2019 for evaluation of worsening lower extremity edema. Creatinine was at 1.43 and CXR showed cardiomegaly with bilateral interstitial thickening likely reflecting interstitial edema and a small right pleural effusion. Lower extremity dopplers were negative for a DVT. By review of phone notes, she did follow-up with her PCP afterwards and Torsemide was increased to 40 mg twice daily with plans for repeat labs in 2 weeks.   She presented to the ED yesterday afternoon for evaluation of a sacral wound that was nonhealing. She also reported worsening weakness and lower extremity edema. By review of medications, she had follow-up with her PCP and was recently prescribed Metolazone 10 mg daily. She is unsure of how long she was on the medication but says for less than 1 week. Reports having worsening lower extremity edema but denies any associated dyspnea on  exertion, orthopnea or PND. No recent chest pain or palpitations. She states she is not overly active around the house and her husband does most of the household chores. They do alternate cooking. She reports not adding additional salt to her food. She says she did fall a few weeks ago but feels like this was secondary to losing her balance and denies any actual syncopal events.  Initial labs showed WBC 4.3, Hgb 9.7, platelets 122, Na+ 133, K+ 3.3 and creatinine 1.58.  AST 25 and ALT 8.  BNP 584.  CXR showed stable appearance of the chest with at least a moderate right pleural effusion and mild cardiomegaly. EKG not scanned into the system.   Torsemide and Metolazone were held at the time of admission with creatinine improved to 1.41 this AM.   Past Medical History:  Diagnosis Date  . Anxiety   . Diabetes mellitus with neuropathy ([place])   . Diabetic neuropathy ([employer])   . DJD (degenerative joint disease)   . H/O aortic valve replacement   . Hyperlipidemia   . Hypertension   . Neuromuscular disorder (HCC)    neuropathy in feet  . Obesity   . Sleep apnea   . Stroke [facility])    " light stroke"  . Wears glasses     Past Surgical History:  Procedure Laterality Date  . ANKLE SURGERY     Left tendon repair  . AORTIC VALVE REPLACEMENT  03/1994  . CARDIAC CATHETERIZATION     1996 [facility]  . CERVICAL SPINE SURGERY    . COLONOSCOPY    .  HEMORRHOID SURGERY    . KNEE ARTHROSCOPY Right 04/13/2015   Procedure: RIGHT KNEE ARTHROSCOPY WITH DEBRIDEMENT AND PARTIAL MEDIAL MENISCECTOMY;  Surgeon: Mcarthur Rossetti, MD;  Location: [facility];  Service: Orthopedics;  Laterality: Right;  . KNEE ARTHROSCOPY W/ MENISCECTOMY Right 04/13/2015  . LUMBAR FUSION       Home Medications:  Prior to Admission medications   Medication Sig Start Date End Date Taking? Authorizing Provider  acetaminophen (TYLENOL) 500 MG tablet Take 500 mg by mouth 2 (two) times daily as needed for mild pain or moderate pain.     Yes [provider]  ALPRAZolam Duanne Moron) 0.5 MG tablet Take 0.5 mg by mouth at bedtime as needed for sleep.    Yes [provider]  bisacodyl (DULCOLAX) 5 MG EC tablet Take 5 mg by mouth daily as needed for moderate constipation.   Yes [provider]  cetirizine (ZYRTEC) 10 MG tablet Take 10 mg by mouth at bedtime as needed for allergies.  02/16/15  Yes [provider]  fluticasone (FLONASE) 50 MCG/ACT nasal spray Place 1 spray into both nostrils at bedtime.  01/13/15  Yes [provider]  HYDROcodone-acetaminophen (NORCO) 10-325 MG tablet Take 1 tablet by mouth 2 (two) times daily.  12/18/15  Yes [provider]  metolazone (ZAROXOLYN) 10 MG tablet Take 10 mg by mouth every morning. 04/22/19  Yes [provider]  metoprolol succinate (TOPROL-XL) 50 MG 24 hr tablet Take 50 mg by mouth every morning. Take with or immediately following a meal.    Yes [provider]  pantoprazole (PROTONIX) 40 MG tablet Take 1 tablet (40 mg total) by mouth daily before breakfast. 03/21/16  Yes Rehman, Mechele Dawley, MD  polyethylene glycol powder (GLYCOLAX/MIRALAX) 17 GM/SCOOP powder Take 17 g by mouth daily.   Yes [provider]  potassium chloride (KLOR-CON) 10 MEQ tablet Take 10 mEq by mouth every morning. 04/05/19  Yes [provider]  simvastatin (ZOCOR) 40 MG tablet Take 40 mg by mouth daily at 8 pm.  10/16/12  Yes [provider]  torsemide (DEMADEX) 20 MG tablet Take 2 tablets (40 mg total) by mouth 2 (two) times daily. 04/15/19  Yes BranchAlphonse Guild, MD  Vitamin D, Ergocalciferol, (DRISDOL) 1.25 MG (50000 UT) CAPS capsule Take 50,000 Units by mouth every Sunday.    Yes [provider]  warfarin (COUMADIN) 2 MG tablet Take 1 tablet daily or as directed by Coumadin clinic Patient taking differently: Take 1-2 mg by mouth See admin instructions. 2mg  on Sundays. Takes 1mg  on all other days. Takes in the morning 09/17/18   Yes Branch, Alphonse Guild, MD  metoprolol succinate (TOPROL XL) 25 MG 24 hr tablet Take 1 tablet (25 mg total) by mouth daily. Patient not taking: Reported on 04/23/2019 01/21/19   Arnoldo Lenis, MD    Inpatient Medications: Scheduled Meds: . feeding supplement (ENSURE ENLIVE)  237 mL Oral BID BM  . fluticasone  1 spray Each Nare QHS  . HYDROcodone-acetaminophen  1 tablet Oral BID  . loratadine  10 mg Oral Daily  . metoprolol succinate  50 mg Oral q morning - 10a  . pantoprazole  40 mg Oral QAC breakfast  . polyethylene glycol  17 g Oral Daily  . simvastatin  40 mg Oral Q2000  . sodium chloride flush  3 mL Intravenous Q12H   Continuous Infusions: . sodium chloride     PRN Meds: sodium chloride, ALPRAZolam, bisacodyl, ondansetron **OR** ondansetron (ZOFRAN) IV, sodium  chloride flush  Allergies:    Allergies  Allergen Reactions  . Tape Itching    Redness, Please use "paper" tape only    Social History:   Social History   Socioeconomic History  . Marital status: Married    Spouse name: Not on file  . Number of children: Not on file  . Years of education: Not on file  . Highest education level: Not on file  Occupational History  . Occupation: Disabled    Employer: RETIRED  Tobacco Use  . Smoking status: Never Smoker  . Smokeless tobacco: Never Used  Substance and Sexual Activity  . Alcohol use: No    Alcohol/week: 0.0 standard drinks  . Drug use: No  . Sexual activity: Not on file  Other Topics Concern  . Not on file  Social History Narrative   Widowed   No regular exercise   2 children   Social Determinants of Health   Financial Resource Strain:   . Difficulty of Paying Living Expenses: Not on file  Food Insecurity:   . Worried About Charity fundraiser in the Last Year: Not on file  . Ran Out of Food in the Last Year: Not on file  Transportation Needs:   . Lack of Transportation (Medical): Not on file  . Lack of Transportation (Non-Medical): Not on file    Physical Activity:   . Days of Exercise per Week: Not on file  . Minutes of Exercise per Session: Not on file  Stress:   . Feeling of Stress : Not on file  Social Connections:   . Frequency of Communication with Friends and Family: Not on file  . Frequency of Social Gatherings with Friends and Family: Not on file  . Attends Religious Services: Not on file  . Active Member of Clubs or Organizations: Not on file  . Attends Archivist Meetings: Not on file  . Marital Status: Not on file  Intimate Partner Violence:   . Fear of Current or Ex-Partner: Not on file  . Emotionally Abused: Not on file  . Physically Abused: Not on file  . Sexually Abused: Not on file     Family History:    Family History  Problem Relation Age of Onset  . Pneumonia Sister   . Dementia Mother   . Diabetes Sister       Review of Systems    General:  No chills, fever, night sweats or weight changes. Positive for generalized weakness.   Cardiovascular:  No chest pain, dyspnea on exertion, orthopnea, palpitations, paroxysmal nocturnal dyspnea. Positive for edema.  Dermatological: No rash, lesions/masses Respiratory: No cough, dyspnea Urologic: No hematuria, dysuria Abdominal:   No nausea, vomiting, diarrhea, bright red blood per rectum, melena, or hematemesis Neurologic:  No visual changes or changes in mental status.  All other systems reviewed and are otherwise negative except as noted above.  Physical Exam/Data    Vitals:   04/23/19 2145 04/23/19 2200 04/23/19 2248 04/24/19 0508  BP:  (!) 111/56 134/66 (!) 109/52  Pulse: 70 71 76 71  Resp:   16 16  Temp:   98.1 F (36.7 C) 97.7 F (36.5 C)  TempSrc:      SpO2: 95% 96% 99% 95%  Weight:      Height:        Intake/Output Summary (Last 24 hours) at 04/24/2019 0748 Last data filed at 04/24/2019 0400 Gross per 24 hour  Intake 240 ml  Output --  Net 240  ml   Filed Weights   04/23/19 1457  Weight: 79 kg   Body mass index is 24.99  kg/m.   General: Pleasant, elderly female appearing in NAD Psych: Normal affect. Neuro: Alert and oriented X 3. Moves all extremities spontaneously. HEENT: Normal  Neck: Supple without bruits or JVD. Lungs:  Resp regular and unlabored, decreased along right base. Heart: Irregularly irregular. No s3, s4. Mechanical valve sounds appreciated. 2/6 SEM along RUSB. Abdomen: Soft, non-tender, non-distended, BS + x 4.  Extremities: No clubbing or cyanosis. 1+ pitting edema bilaterally . DP/PT/Radials 2+ and equal bilaterally.   EKG:  The EKG from 03/27/2019 is reviewed which shows atrial fibrillation, HR 78 with RAD and PVC's. Nonspecific IVCD.   Telemetry:  Telemetry was personally reviewed and demonstrates: Atrial fibrillation, HR in 60's to 80's. Occasional PVC's.    Labs/Studies     Relevant CV Studies:  Echocardiogram: 10/2015 Study Conclusions   - Left ventricle: The cavity size was normal. Wall thickness was  increased in a pattern of moderate LVH. Systolic function was  normal. The estimated ejection fraction was in the range of 55%  to 60%. The study is not technically sufficient to allow  evaluation of LV diastolic function.  - Aortic valve: Medical record indicated St Jude mechanical valve  in the AV position, unknown size. There was no stenosis. There  was mild regurgitation.  - Mitral valve: Suspect possible mitral anular ring based on  appearance, but cannot confirm. The valve surgery records are not  available in the medical record. Moderately calcified annulus.  Mildly thickened leaflets . There was mild regurgitation.  - Left atrium: The atrium was moderately dilated.  - Right atrium: The atrium was moderately dilated.  - Atrial septum: No defect or patent foramen ovale was identified.  - Tricuspid valve: There was moderate regurgitation.  - Pulmonary arteries: Systolic pressure was mildly increased. PA  peak pressure: 38 mm Hg (S).  -  Technically adequate study.   Cardiac Event Monitor: 10/2018  7 day event monitor  Min HR 55, Max HR 119, Avg HR 71  No symptoms reported  Tracings show she is in afib throughout the study, rate controlled  Laboratory Data:  Chemistry Recent Labs  Lab 04/23/19 1602 04/24/19 0408  NA 133* 137  K 3.3* 3.5  CL 95* 97*  CO2 28 28  GLUCOSE 90 87  BUN 21 21  CREATININE 1.58* 1.41*  CALCIUM 8.1* 8.5*  GFRNONAA 30* 35*  GFRAA 35* 40*  ANIONGAP 10 12    Recent Labs  Lab 04/23/19 1602 04/24/19 0408  PROT 6.9 6.4*  ALBUMIN 2.4* 2.3*  AST 25 21  ALT 8 7  ALKPHOS 89 82  BILITOT 1.9* 1.9*   Hematology Recent Labs  Lab 04/23/19 1602 04/24/19 0408  WBC 4.3 4.5  RBC 2.74* 2.60*  HGB 9.7* 9.2*  HCT 31.5* 29.3*  MCV 115.0* 112.7*  MCH 35.4* 35.4*  MCHC 30.8 31.4  RDW 16.0* 16.0*  PLT 122* 125*   Cardiac EnzymesNo results for input(s): TROPONINI in the last 168 hours. No results for input(s): TROPIPOC in the last 168 hours.  BNP Recent Labs  Lab 04/23/19 1605  BNP 584.0*    DDimer No results for input(s): DDIMER in the last 168 hours.  Radiology/Studies:  DG Chest Port 1 View  Result Date: 04/23/2019 CLINICAL DATA:  Edema.  Bleeding wound. EXAM: PORTABLE CHEST 1 VIEW COMPARISON:  03/27/2019 FINDINGS: Prior median sternotomy. Stable blunting of the right costophrenic  angle compatible with moderate right pleural effusion, with associated passive atelectasis. Mild enlargement of the cardiopericardial silhouette. Scarring or atelectasis in the right middle lobe and lingula. IMPRESSION: 1. Stable appearance the chest with at least a moderate right pleural effusion and bibasilar atelectasis or scarring. 2. Stable mild cardiomegaly. Electronically Signed   By: Van Clines M.D.   On: 04/23/2019 18:59     Assessment & Plan    1. Acute on Chronic Diastolic CHF - she reports having chronic lower extremity edema but weight has overall been stable on her home scales  is actually recorded as 3 pounds down from her last office visit in 01/2019. She denies any orthopnea or PND and reports her dyspnea on exertion has been at baseline with no acute change. - BNP is elevated to 584 and CXR shows a moderate right pleural effusion which is actually consistent with her prior imaging studies. Will obtain a repeat echocardiogram to reassess EF and valve function.   - She was on Torsemide 40 mg twice daily and had actually been started on high-dose Metolazone 10 mg daily by her PCP last week. Agree with holding both at this time and monitoring renal function. Would ultimately plan to restart Torsemide and hold Metolazone given her CKD and history of orthostasis. She will need close follow-up labs as an outpatient for monitoring of her renal function. - Reviewed the use of compression stockings with the patient as a more conservative approach to help with her edema. Sodium and fluid restrictions reviewed.   2.  Persistent Atrial Fibrillation - rates have been stable in the 60's to 80's by review of telemetry. Continue Toprol-XL (was on 100mg  daily at the time of her last visit but ordered as 50mg  daily at this time). Will update order.  - she is on Coumadin for anticoagulation. INR elevated to 3.9 on admission. Pharmacy following and assisting with dosing.   3. Aortic Valve Replacement - s/p mechanical AVR in 1995. Most recent echocardiogram in 2017 showed normal functioning with no stenosis and mild regurgitation. Repeat echocardiogram pending.   4. Mitral Valve Regurgitation - echo in 2017 mentioned possible mitral annular ring based on appearance with mild regurgitation. Will reassess with repeat echo.   5. Acute on Chronic Stage 3 CKD - suspect this is secondary to increased Torsemide dosing along with a very high dose of Metolazone 10mg  daily. Would hold Metolazone going forward and if needed in the future, would prefer a more appropriate dose of 2.5mg  daily or 5mg  daily  as a maximum given her baseline CKD and age.    For questions or updates, please contact [place] Please consult [email or web address] for contact info under Cardiology/STEMI.  Signed, Erma Heritage, PA-C 04/24/2019, 7:48 AM Pager: [phone]  The patient was seen and examined, and I agree with the history, physical exam, assessment and plan as documented above, with modifications as noted below. I have also personally reviewed all relevant documentation, old records, labs, and both radiographic and cardiovascular studies. I have also independently interpreted old and new ECG's.  Briefly, this is an 79 year old woman with a history of persistent atrial fibrillation, chronic diastolic heart failure, aortic valve replacement 1995, and recurrent pleural effusions.  She presented to the hospital yesterday with generalized weakness and lower extremity edema.  Her PCP recently made diuretic adjustments as detailed above which included metolazone 10 mg daily.  Chest x-ray showed moderate right pleural effusion.  Creatinine initially 1.58 but after diuretics were held creatinine is down  to 1.41 today.  Of note, albumin is 2.3.  An echocardiogram has been ordered and was being performed at the time of my evaluation.  Recommendations: I suspect some of her chronic lower extremity edema and recurrent pleural effusions is due to hypoalbuminemia and consequent decreased oncotic pressure.  That is likely why she became intravascularly depleted after the dose of diuretics were escalated leading to prerenal acute on chronic kidney injury.  When she is at baseline I would recommend resuming torsemide but would only use low doses of metolazone as needed.  She also likely has venous insufficiency and would benefit from compression stockings.  Continue Toprol-XL with pharmacy to manage warfarin while she is hospitalized.   Kate Sable, MD, [facility]  04/24/2019 9:49 AM

## 2019-04-24 NOTE — Progress Notes (Signed)
ANTICOAGULATION CONSULT NOTE -  Pharmacy Consult for Coumadin  Indication: atrial fibrillation/AVR  Allergies  Allergen Reactions  . Tape Itching    Redness, Please use "paper" tape only    Patient Measurements: Height: 5\' 10"  (177.8 cm) Weight: 174 lb 2.6 oz (79 kg) IBW/kg (Calculated) : 68.5 Heparin Dosing Weight:   Vital Signs: BP: 104/57 (02/04 1332) Pulse Rate: 76 (02/04 1332)  Labs: Recent Labs    04/23/19 1602 04/24/19 0408 04/24/19 1725  HGB 9.7* 9.2*  --   HCT 31.5* 29.3*  --   PLT 122* 125*  --   LABPROT 37.9*  --  35.4*  INR 3.9*  --  3.5*  CREATININE 1.58* 1.41*  --     Estimated Creatinine Clearance: 33.8 mL/min (A) (by C-G formula based on SCr of 1.41 mg/dL (H)).   Medical History: Past Medical History:  Diagnosis Date  . Anxiety   . Diabetes mellitus with neuropathy ([place])   . Diabetic neuropathy ([place])   . DJD (degenerative joint disease)   . H/O aortic valve replacement   . Hyperlipidemia   . Hypertension   . Neuromuscular disorder ([facility])    neuropathy in feet  . Obesity   . Sleep apnea   . Stroke [facility])    " light stroke"  . Wears glasses     Medications:  Medications Prior to Admission  Medication Sig Dispense Refill Last Dose  . acetaminophen (TYLENOL) 500 MG tablet Take 500 mg by mouth 2 (two) times daily as needed for mild pain or moderate pain.    unknown at Unknown time  . ALPRAZolam (XANAX) 0.5 MG tablet Take 0.5 mg by mouth at bedtime as needed for sleep.    04/22/2019 at Unknown time  . bisacodyl (DULCOLAX) 5 MG EC tablet Take 5 mg by mouth daily as needed for moderate constipation.   Past Week at Unknown time  . cetirizine (ZYRTEC) 10 MG tablet Take 10 mg by mouth at bedtime as needed for allergies.   11 unknown  . fluticasone (FLONASE) 50 MCG/ACT nasal spray Place 1 spray into both nostrils at bedtime.   11 04/22/2019 at Unknown time  . HYDROcodone-acetaminophen (NORCO) 10-325 MG tablet Take 1 tablet by mouth 2 (two) times daily.    0 04/23/2019 at Unknown time  . metolazone (ZAROXOLYN) 10 MG tablet Take 10 mg by mouth every morning.   04/23/2019 at Unknown time  . metoprolol succinate (TOPROL-XL) 50 MG 24 hr tablet Take 50 mg by mouth every morning. Take with or immediately following a meal.    04/23/2019 at 815a  . pantoprazole (PROTONIX) 40 MG tablet Take 1 tablet (40 mg total) by mouth daily before breakfast. 30 tablet 5 04/23/2019 at Unknown time  . polyethylene glycol powder (GLYCOLAX/MIRALAX) 17 GM/SCOOP powder Take 17 g by mouth daily.   Past Week at Unknown time  . potassium chloride (KLOR-CON) 10 MEQ tablet Take 10 mEq by mouth every morning.   04/23/2019 at Unknown time  . simvastatin (ZOCOR) 40 MG tablet Take 40 mg by mouth daily at 8 pm.    04/22/2019 at Unknown time  . torsemide (DEMADEX) 20 MG tablet Take 2 tablets (40 mg total) by mouth 2 (two) times daily. 360 tablet 3 04/23/2019 at Unknown time  . Vitamin D, Ergocalciferol, (DRISDOL) 1.25 MG (50000 UT) CAPS capsule Take 50,000 Units by mouth every Sunday.    04/20/2019 at Unknown time  . warfarin (COUMADIN) 2 MG tablet Take 1 tablet daily or as  directed by Coumadin clinic (Patient taking differently: Take 1-2 mg by mouth See admin instructions. 2mg  on Sundays. Takes 1mg  on all other days. Takes in the morning) 45 tablet 6 04/23/2019 at 815a  . metoprolol succinate (TOPROL XL) 25 MG 24 hr tablet Take 1 tablet (25 mg total) by mouth daily. (Patient not taking: Reported on 04/23/2019) 30 tablet 3 Not Taking at Unknown time   Scheduled:  . feeding supplement (ENSURE ENLIVE)  237 mL Oral BID BM  . fluticasone  1 spray Each Nare QHS  . HYDROcodone-acetaminophen  1 tablet Oral BID  . loratadine  10 mg Oral Daily  . metoprolol succinate  100 mg Oral q morning - 10a  . multivitamin with minerals  1 tablet Oral Daily  . pantoprazole  40 mg Oral QAC breakfast  . polyethylene glycol  17 g Oral Daily  . simvastatin  40 mg Oral Q2000  . sodium chloride flush  3 mL Intravenous Q12H    Infusions:  . sodium chloride      Assessment: Mary [name] presented with sacral wound issue (bleeding) for the last several weeks. She has been on coumadin for AVR/afib. Last dose 2/3.  INR supratherapeutic 2/4 at 3.5  PTA dose: 1mg  qday except 2mg  Sunday   Goal of Therapy:  INR 2.5-3.5 Monitor platelets by anticoagulation protocol: Yes   Plan:  Hold warfarin x 1 dose until INR can be drawn. Monitor daily INR and s/s of bleeding.  Thomasenia Sales, PharmD, MBA, BCGP Clinical Pharmacist  04/24/2019 6:30 PM

## 2019-04-24 NOTE — Progress Notes (Addendum)
ANTICOAGULATION CONSULT NOTE -  Pharmacy Consult for Coumadin  Indication: atrial fibrillation/AVR  Allergies  Allergen Reactions  . Tape Itching    Redness, Please use "paper" tape only    Patient Measurements: Height: 5\' 10"  (177.8 cm) Weight: 174 lb 2.6 oz (79 kg) IBW/kg (Calculated) : 68.5 Heparin Dosing Weight:   Vital Signs: Temp: 97.7 F (36.5 C) (02/04 0508) BP: 104/57 (02/04 1332) Pulse Rate: 76 (02/04 1332)  Labs: Recent Labs    04/23/19 1602 04/24/19 0408  HGB 9.7* 9.2*  HCT 31.5* 29.3*  PLT 122* 125*  LABPROT 37.9*  --   INR 3.9*  --   CREATININE 1.58* 1.41*    Estimated Creatinine Clearance: 33.8 mL/min (A) (by C-G formula based on SCr of 1.41 mg/dL (H)).   Medical History: Past Medical History:  Diagnosis Date  . Anxiety   . Diabetes mellitus with neuropathy ([place])   . Diabetic neuropathy ([place])   . DJD (degenerative joint disease)   . H/O aortic valve replacement   . Hyperlipidemia   . Hypertension   . Neuromuscular disorder (HCC)    neuropathy in feet  . Obesity   . Sleep apnea   . Stroke [facility])    " light stroke"  . Wears glasses     Medications:  Medications Prior to Admission  Medication Sig Dispense Refill Last Dose  . acetaminophen (TYLENOL) 500 MG tablet Take 500 mg by mouth 2 (two) times daily as needed for mild pain or moderate pain.    unknown at Unknown time  . ALPRAZolam (XANAX) 0.5 MG tablet Take 0.5 mg by mouth at bedtime as needed for sleep.    04/22/2019 at Unknown time  . bisacodyl (DULCOLAX) 5 MG EC tablet Take 5 mg by mouth daily as needed for moderate constipation.   Past Week at Unknown time  . cetirizine (ZYRTEC) 10 MG tablet Take 10 mg by mouth at bedtime as needed for allergies.   11 unknown  . fluticasone (FLONASE) 50 MCG/ACT nasal spray Place 1 spray into both nostrils at bedtime.   11 04/22/2019 at Unknown time  . HYDROcodone-acetaminophen (NORCO) 10-325 MG tablet Take 1 tablet by mouth 2 (two) times daily.   0  04/23/2019 at Unknown time  . metolazone (ZAROXOLYN) 10 MG tablet Take 10 mg by mouth every morning.   04/23/2019 at Unknown time  . metoprolol succinate (TOPROL-XL) 50 MG 24 hr tablet Take 50 mg by mouth every morning. Take with or immediately following a meal.    04/23/2019 at 815a  . pantoprazole (PROTONIX) 40 MG tablet Take 1 tablet (40 mg total) by mouth daily before breakfast. 30 tablet 5 04/23/2019 at Unknown time  . polyethylene glycol powder (GLYCOLAX/MIRALAX) 17 GM/SCOOP powder Take 17 g by mouth daily.   Past Week at Unknown time  . potassium chloride (KLOR-CON) 10 MEQ tablet Take 10 mEq by mouth every morning.   04/23/2019 at Unknown time  . simvastatin (ZOCOR) 40 MG tablet Take 40 mg by mouth daily at 8 pm.    04/22/2019 at Unknown time  . torsemide (DEMADEX) 20 MG tablet Take 2 tablets (40 mg total) by mouth 2 (two) times daily. 360 tablet 3 04/23/2019 at Unknown time  . Vitamin D, Ergocalciferol, (DRISDOL) 1.25 MG (50000 UT) CAPS capsule Take 50,000 Units by mouth every Sunday.    04/20/2019 at Unknown time  . warfarin (COUMADIN) 2 MG tablet Take 1 tablet daily or as directed by Coumadin clinic (Patient taking differently: Take 1-2  mg by mouth See admin instructions. 2mg  on Sundays. Takes 1mg  on all other days. Takes in the morning) 45 tablet 6 04/23/2019 at 815a  . metoprolol succinate (TOPROL XL) 25 MG 24 hr tablet Take 1 tablet (25 mg total) by mouth daily. (Patient not taking: Reported on 04/23/2019) 30 tablet 3 Not Taking at Unknown time   Scheduled:  . feeding supplement (ENSURE ENLIVE)  237 mL Oral BID BM  . fluticasone  1 spray Each Nare QHS  . HYDROcodone-acetaminophen  1 tablet Oral BID  . loratadine  10 mg Oral Daily  . metoprolol succinate  100 mg Oral q morning - 10a  . multivitamin with minerals  1 tablet Oral Daily  . pantoprazole  40 mg Oral QAC breakfast  . polyethylene glycol  17 g Oral Daily  . simvastatin  40 mg Oral Q2000  . sodium chloride flush  3 mL Intravenous Q12H    Infusions:  . sodium chloride      Assessment: Mary [name] presented with sacral wound issue (bleeding) for the last several weeks. She has been on coumadin for AVR/afib. Last dose 2/3.  INR supratherapeutic 2/3 at 3.9.  Lab currently unable to get INR on 2/4- will follow-up once lab is drawn.  PTA dose: 1mg  qday except 2mg  Sunday  Goal of Therapy:  INR 2.5-3.5 Monitor platelets by anticoagulation protocol: Yes   Plan:  Hold warfarin x 1 dose until INR can be drawn. Monitor daily INR and s/s of bleeding.  Margot Ables, PharmD Clinical Pharmacist 04/24/2019 2:18 PM

## 2019-04-24 NOTE — Progress Notes (Signed)
Initial Nutrition Assessment  RD working remotely.   DOCUMENTATION CODES:   Not applicable  INTERVENTION:  - continue Ensure Enlive BID, each supplement provides 350 kcal and 20 grams of protein. - will order Magic Cup BID with meals, each supplement provides 290 kcal and 9 grams of protein. - will order daily multivitamin with minerals.    NUTRITION DIAGNOSIS:   Increased nutrient needs related to acute illness, wound healing as evidenced by estimated needs.  GOAL:   Patient will meet greater than or equal to 90% of their needs  MONITOR:   PO intake, Supplement acceptance, Labs, Weight trends, Skin  REASON FOR ASSESSMENT:   Malnutrition Screening Tool  ASSESSMENT:   79 y.o. female with medical history of aortic valve replacement, metal valve placed in 1995/96, paroxysmal afib, HTN, and hyperlipidemia. She presented to the ED due to worsening leg swell, fatigue, bleeding sacral wound, and s/p fall. She was started on high-dose metolazone by her primary care physician approximately a week PTA. CXR showed at least moderate R pleural effusion and bibasilar atelectasis or scarring.  Patient consumed 50% of both breakfast and lunch today. Weight on 2/3 was 174 lb and weight on 01/21/19 was 177 lb. This indicates 3 lb weight loss (1.7% body weight) in the past 3 months; not significant for time frame. Of note, flow sheet documentation indicates patient has mild edema to LUE and deep pitting edema to BLE so total weight loss is likely much greater than 3 lb.   She reports that she is unsure of what her UBW is because of changes over the past few months to medications and changes in degree of BLE swelling. She states that appetite has been decreased for several months, lack of interest in eating at times mainly 2/2 living alone. She does not have any chewing or swallowing issues at baseline.   Per notes: - BLE edema--diuretics currently being held - AKI - s/p fall--PT recommends Home  Health PT at d/c   Labs reviewed; CBGs: 98, 77, 107 mg/dl, Cl: 97 mmol/l, creatinine: 1.41 mg/dl, Ca: 8.5 mg/dl, GFR: 35 ml/min. Medications reviewed; 1 packet miralax/day, 40 mEq Klor-Con x1 dose 2/3.     NUTRITION - FOCUSED PHYSICAL EXAM:  unable to complete at this time.   Diet Order:   Diet Order            Diet Heart Room service appropriate? Yes; Fluid consistency: Thin  Diet effective now              EDUCATION NEEDS:   No education needs have been identified at this time  Skin:  Skin Assessment: Skin Integrity Issues: Skin Integrity Issues:: Stage II Stage II: R buttocks x2; sacrum  Last BM:  2/2  Height:   Ht Readings from Last 1 Encounters:  04/23/19 5\' 10"  (1.778 m)    Weight:   Wt Readings from Last 1 Encounters:  04/23/19 79 kg    Ideal Body Weight:  68.2 kg  BMI:  Body mass index is 24.99 kg/m.  Estimated Nutritional Needs:   Kcal:  1850-2050 kcal  Protein:  90-100 grams  Fluid:  >/= 2 L/day     Jarome Matin, MS, RD, LDN, CNSC Inpatient Clinical Dietitian RD pager # available in [facility]  After hours/weekend pager # available in [facility]

## 2019-04-24 NOTE — TOC Initial Note (Signed)
Transition of Care [facility]) - Initial/Assessment Note    Patient Details  Name: Mary [name] MRN: [id number] Date of Birth: 12/08/1940  Transition of Care [facility]) CM/SW Contact:    Boneta Lucks, RN Phone Number: 04/24/2019, 2:07 PM  Clinical Narrative:    Patient admitted for a fall. Patient lives at home with her husband.  PT is recommending HHPT.  Per Vaughan Basta with [facility] they have a referral from Dr. Karie Kirks as well for HHPT.  CM discussed with patient she wants to check with her husband and do what he wants.   CM tried to reach [place] and left a message. TOC to follow.  Plan to dc tomorrow.                Expected Discharge Plan: [place] Barriers to Discharge: Continued Medical Work up   Patient Goals and CMS Choice Patient states their goals for this hospitalization and ongoing recovery are:: to go home. CMS Medicare.gov Compare Post Acute Care list provided to:: Patient Choice offered to / list presented to : Patient  Expected Discharge Plan and Services Expected Discharge Plan: [place]     Prior Living Arrangements/Services   Lives with:: Spouse          Need for Family Participation in Patient Care: Yes (Comment) Care giver support system in place?: Yes (comment)   Criminal Activity/Legal Involvement Pertinent to Current Situation/Hospitalization: No - Comment as needed  Activities of Daily Living Home Assistive Devices/Equipment: Gilford Rile (specify type) ADL Screening (condition at time of admission) Patient's cognitive ability adequate to safely complete daily activities?: Yes Is the patient deaf or have difficulty hearing?: Yes Does the patient have difficulty seeing, even when wearing glasses/contacts?: No Does the patient have difficulty concentrating, remembering, or making decisions?: Yes Patient able to express need for assistance with ADLs?: Yes Does the patient have difficulty dressing or bathing?: No Independently  performs ADLs?: Yes (appropriate for developmental age) Does the patient have difficulty walking or climbing stairs?: No Weakness of Legs: Both Weakness of Arms/Hands: None  Permission Sought/Granted     Emotional Assessment       Orientation: : Oriented to Self, Oriented to Place, Oriented to Situation Alcohol / Substance Use: Not Applicable Psych Involvement: No (comment)  Admission diagnosis:  Coagulopathy ([place]) [D68.9] Fall [W19.XXXA] Pleural effusion on right [J90] Edema, unspecified type [R60.9] Patient Active Problem List   Diagnosis Date Noted  . Pressure injury of skin 04/24/2019  . Fall 04/23/2019  . S/P right knee arthroscopy 04/13/2015  . OSA (obstructive sleep apnea) 01/01/2013  . Diabetes ([place]) 10/23/2012  . Aortic valve disorder 06/30/2010  . CVA (cerebral vascular accident) ([place]) 06/30/2010  . S/P aortic valve replacement 06/30/2010  . Hyperlipidemia 09/29/2008  . Obesity 09/29/2008  . Essential hypertension 09/29/2008  . DEGENERATIVE JOINT DISEASE 09/29/2008   PCP:  [name], MD Pharmacy:   [facility] [address], [name] - [address] [name] [address] Phone: [phone] Fax: [phone]

## 2019-04-24 NOTE — Progress Notes (Signed)
Repeat CBG 98 after pt consumed peanut butter crackers and 4oz of soda. Pt alert, oriented within normal limits, resting with call bell in reach and bed alarms active.

## 2019-04-24 NOTE — Progress Notes (Signed)
Ambulated with walker to bathroom at slow steady pace.  Voided.  Ted hose placed.  Lab could not draw blood and patient refused another tech.  MD aware.  Husband in room today and MD to call him with update

## 2019-04-24 NOTE — Progress Notes (Signed)
Pt stated she was in no pain and refused scheduled pain medication. Pt requested something to help her sleep. Pt awake, resting in bed, call bell in reach, floor mats in place and bed alarm active

## 2019-04-24 NOTE — Plan of Care (Signed)
  Problem: Acute Rehab PT Goals(only PT should resolve) Goal: Pt Will Go Supine/Side To Sit Outcome: Progressing Flowsheets (Taken 04/24/2019 0859) Pt will go Supine/Side to Sit: with modified independence Goal: Patient Will Transfer Sit To/From Stand Outcome: Progressing Flowsheets (Taken 04/24/2019 0859) Patient will transfer sit to/from stand: with modified independence Goal: Pt Will Transfer Bed To Chair/Chair To Bed Outcome: Progressing Flowsheets (Taken 04/24/2019 0859) Pt will Transfer Bed to Chair/Chair to Bed: with modified independence Goal: Pt Will Ambulate Outcome: Progressing Flowsheets (Taken 04/24/2019 0859) Pt will Ambulate:  75 feet  with modified independence  with rolling walker   9:00 AM, 04/24/19 Lonell Grandchild, MPT Physical Therapist with [facility] [phone] office [phone] mobile phone

## 2019-04-24 NOTE — Evaluation (Signed)
Physical Therapy Evaluation Patient Details Name: Mary [name] MRN: [id number] DOB: [birth year] Today's Date: 04/24/2019   History of Present Illness  Tashonna Silliman  is a 79 y.o. female, with history of aortic valve replacement, metal valve placed in 1995/96, paroxysmal atrial fibrillation, hypertension, hyperlipidemia came to hospital with complaints of worsening leg swelling, fatigue, bleeding sacral wound.  Patient denies shortness of breath, complains of intermittent chest pain.  Denies nausea vomiting or diarrhea.  No abdominal pain.  Patient says that she fell few weeks ago and has felt generally weak after that.  She has had difficulty walking.In the ED, BNP was elevated to 584, chest x-ray showed stable appearance of the chest with at least moderate right pleural effusion and bibasilar atelectasis or scarring.Lab work showed creatinine 1.58, worse from previous creatinine of 1.43 on 03/27/2019.Patient is not requiring oxygen.  She is not short of breath.    Clinical Impression  Patient functioning near baseline for functional mobility and gait, demonstrates slow labored movement for sitting up at bedside requiring use of bed rail (sleeps in hospital bed at home), able to transfer to commode in bathroom with use of grab bar, ambulated in hallway with slow labored cadence without loss of balance, limited mostly due to c/o fatigue and tolerated sitting up in chair after therapy - nursing staff notified.  Patient will benefit from continued physical therapy in hospital and recommended venue below to increase strength, balance, endurance for safe ADLs and gait.     Follow Up Recommendations Home health PT;Supervision for mobility/OOB;Supervision - Intermittent    Equipment Recommendations  None recommended by PT    Recommendations for Other Services       Precautions / Restrictions Precautions Precautions: Fall Restrictions Weight Bearing Restrictions: No      Mobility  Bed Mobility Overal  bed mobility: Needs Assistance Bed Mobility: Supine to Sit     Supine to sit: HOB elevated;Supervision     General bed mobility comments: increased time, labored movement  Transfers Overall transfer level: Needs assistance Equipment used: Rolling walker (2 wheeled) Transfers: Sit to/from [facility] Sit to Stand: Supervision;Min guard Stand pivot transfers: Supervision;Min guard       General transfer comment: had difficulty completing stand to sit on commode requiring verbal/tactile cueing for safety  Ambulation/Gait Ambulation/Gait assistance: Supervision Gait Distance (Feet): 45 Feet Assistive device: Rolling walker (2 wheeled) Gait Pattern/deviations: Decreased step length - right;Decreased step length - left;Decreased stride length Gait velocity: decreased   General Gait Details: slow slightly labored cadence without loss of balance, limited secondary to c/o fatigue  Stairs            Wheelchair Mobility    Modified Rankin (Stroke Patients Only)       Balance Overall balance assessment: Needs assistance Sitting-balance support: Feet supported;No upper extremity supported Sitting balance-Leahy Scale: Good Sitting balance - Comments: seated at bedside   Standing balance support: During functional activity;Bilateral upper extremity supported Standing balance-Leahy Scale: Fair Standing balance comment: using RW                             Pertinent Vitals/Pain Pain Assessment: No/denies pain    Home Living Family/patient expects to be discharged to:: Private residence Living Arrangements: Spouse/significant other Available Help at Discharge: Family;Available 24 hours/day Type of Home: House Home Access: Ramped entrance     Home Layout: One level Home Equipment: Walker - 2 wheels;Walker - 4 wheels;Wheelchair -  manual;Bedside commode;Shower seat - built in;Hospital bed      Prior Function Level of Independence:  Independent with assistive device(s)         Comments: household and short distanced Engineer, manufacturing systems, drives     Hand Dominance        Extremity/Trunk Assessment   Upper Extremity Assessment Upper Extremity Assessment: Generalized weakness    Lower Extremity Assessment Lower Extremity Assessment: Generalized weakness    Cervical / Trunk Assessment Cervical / Trunk Assessment: Normal  Communication   Communication: No difficulties  Cognition Arousal/Alertness: Awake/alert Behavior During Therapy: WFL for tasks assessed/performed Overall Cognitive Status: Within Functional Limits for tasks assessed                                        General Comments      Exercises     Assessment/Plan    PT Assessment Patient needs continued PT services  PT Problem List Decreased strength;Decreased activity tolerance;Decreased balance;Decreased mobility       PT Treatment Interventions Gait training;Functional mobility training;Therapeutic activities;Therapeutic exercise;Patient/family education;Balance training    PT Goals (Current goals can be found in the Care Plan section)  Acute Rehab PT Goals Patient Stated Goal: return home with family to assist PT Goal Formulation: With patient Time For Goal Achievement: 04/28/19 Potential to Achieve Goals: Good    Frequency Min 3X/week   Barriers to discharge        Co-evaluation               AM-PAC PT "6 Clicks" Mobility  Outcome Measure Help needed turning from your back to your side while in a flat bed without using bedrails?: None Help needed moving from lying on your back to sitting on the side of a flat bed without using bedrails?: A Little Help needed moving to and from a bed to a chair (including a wheelchair)?: A Little Help needed standing up from a chair using your arms (e.g., wheelchair or bedside chair)?: A Little Help needed to walk in hospital room?: A Little Help  needed climbing 3-5 steps with a railing? : A Lot 6 Click Score: 18    End of Session   Activity Tolerance: Patient tolerated treatment well;Patient limited by fatigue Patient left: in chair;with call bell/phone within reach Nurse Communication: Mobility status PT Visit Diagnosis: Unsteadiness on feet (R26.81);Other abnormalities of gait and mobility (R26.89);Muscle weakness (generalized) (M62.81)    Time: [id number] PT Time Calculation (min) (ACUTE ONLY): 34 min   Charges:   PT Evaluation $PT Eval Moderate Complexity: 1 Mod PT Treatments $Therapeutic Activity: 23-37 mins        8:57 AM, 04/24/19 Lonell Grandchild, MPT Physical Therapist with [facility] [phone] office [phone] mobile phone

## 2019-04-25 DIAGNOSIS — D631 Anemia in chronic kidney disease: Secondary | ICD-10-CM | POA: Diagnosis present

## 2019-04-25 DIAGNOSIS — I10 Essential (primary) hypertension: Secondary | ICD-10-CM

## 2019-04-25 DIAGNOSIS — R601 Generalized edema: Secondary | ICD-10-CM

## 2019-04-25 DIAGNOSIS — E8809 Other disorders of plasma-protein metabolism, not elsewhere classified: Secondary | ICD-10-CM | POA: Diagnosis present

## 2019-04-25 DIAGNOSIS — N183 Chronic kidney disease, stage 3 unspecified: Secondary | ICD-10-CM | POA: Diagnosis present

## 2019-04-25 DIAGNOSIS — I4819 Other persistent atrial fibrillation: Secondary | ICD-10-CM | POA: Diagnosis present

## 2019-04-25 DIAGNOSIS — R600 Localized edema: Secondary | ICD-10-CM

## 2019-04-25 LAB — BASIC METABOLIC PANEL
Anion gap: 9 (ref 5–15)
BUN: 19 mg/dL (ref 8–23)
CO2: 28 mmol/L (ref 22–32)
Calcium: 8.4 mg/dL — ABNORMAL LOW (ref 8.9–10.3)
Chloride: 99 mmol/L (ref 98–111)
Creatinine, Ser: 1.48 mg/dL — ABNORMAL HIGH (ref 0.44–1.00)
GFR calc Af Amer: 38 mL/min — ABNORMAL LOW (ref >60)
GFR calc non Af Amer: 33 mL/min — ABNORMAL LOW (ref >60)
Glucose, Bld: 94 mg/dL (ref 70–99)
Potassium: 3.5 mmol/L (ref 3.5–5.1)
Sodium: 136 mmol/L (ref 135–145)

## 2019-04-25 LAB — PROTIME-INR
INR: 3.8 — ABNORMAL HIGH (ref 0.8–1.2)
Prothrombin Time: 37.6 seconds — ABNORMAL HIGH (ref 11.4–15.2)

## 2019-04-25 LAB — GLUCOSE, CAPILLARY
Glucose-Capillary: 84 mg/dL (ref 70–99)
Glucose-Capillary: 85 mg/dL (ref 70–99)
Glucose-Capillary: 81 mg/dL (ref 70–99)

## 2019-04-25 MED ORDER — FUROSEMIDE 10 MG/ML IJ SOLN
40.0000 mg | Freq: Two times a day (BID) | INTRAMUSCULAR | Status: DC
  Administered 2019-04-25 – 2019-04-27 (×4): 40 mg via INTRAVENOUS
  Filled 2019-04-25 (×4): qty 4

## 2019-04-25 MED ORDER — ALBUMIN HUMAN 25 % IV SOLN
25.0000 g | Freq: Four times a day (QID) | INTRAVENOUS | Status: AC
  Administered 2019-04-25 – 2019-04-26 (×4): 25 g via INTRAVENOUS
  Filled 2019-04-25: qty 100
  Filled 2019-04-25 (×3): qty 50
  Filled 2019-04-25: qty 100

## 2019-04-25 NOTE — TOC Progression Note (Signed)
Transition of Care [facility]) - Progression Note    Patient Details  Name: Mary [name] MRN: [id number] Date of Birth: 04/01/1940  Transition of Care [facility]) CM/SW Contact  Boneta Lucks, RN Phone Number: 04/25/2019, 1:19 PM  Clinical Narrative:   Clinical Narrative:   Patient admitted for fall. Patient lives at home with her spouse.  PT is recommending HHPT.  Patient was undecided. [facility] had an order from Dr. Vickey Sages office for [facility]  Vaughan Basta with [facility] visited patient and her husband. They are now agreeing to Home health.  MD updated, need orders for PT/RN/Aide.   Expected Discharge Plan: [place] Barriers to Discharge: Continued Medical Work up  Expected Discharge Plan and Services Expected Discharge Plan: [place]: PT, RN, Nurse's Aide [facility] Agency: [place] ([facility]) Date HH Agency Contacted: 04/25/19 Time [place]: [id number] Representative spoke with at [place]: Romualdo Bolk

## 2019-04-25 NOTE — Progress Notes (Signed)
PROGRESS NOTE    Mary [name]  [id number] DOB: Dec 28, 1940 DOA: 04/23/2019 PCP: [name], MD    Brief Narrative:  79 year old female who is admitted to the hospital after suffering a fall.  She is on chronic torsemide and was recently started on metolazone for progressive anasarca/lower extremity edema.  It was felt that she may have developed some intravascular volume depletion and hypovolemia.  She was admitted to the hospital and diuretics were held.  She was seen by cardiology and underwent echocardiogram.  She is having started on intravenous albumin as well as IV Lasix and she still has evidence of volume overload.   Assessment & Plan:   Active Problems:   Essential hypertension   S/P aortic valve replacement   Fall   Pressure injury of skin   Anasarca   Hypoalbuminemia   Persistent atrial fibrillation (HCC)   CKD (chronic kidney disease), stage III   Anemia due to chronic kidney disease   1. Anasarca.  Suspect is related to hypoalbuminemia.  Check urinalysis to evaluate for proteinuria.  Started on albumin infusions and intravenous Lasix. 2. Chronic kidney disease stage III.  Creatinine is currently near baseline.  Continue to monitor. 3. Anemia due to chronic kidney disease.  Currently stable.  No signs of bleeding. 4. Persistent atrial fibrillation.  Heart rate is currently stable.  Anticoagulated with Coumadin. 5. Status post aortic valve replacement.  Continue on anticoagulation. 6. Hypertension.  Blood pressures currently stable.  Continue to monitor.   DVT prophylaxis: Coumadin Code Status: Full code Family Communication: Discussed with husband over the phone Disposition Plan: Discharge home once she has been adequately diuresed   Consultants:   Cardiology  Procedures:   Echocardiogram  Antimicrobials:      Subjective: Overall she is feeling better.  Continues have swelling in upper and lower extremities.  No shortness of  breath.  Objective: Vitals:   04/24/19 1936 04/24/19 2114 04/25/19 0559 04/25/19 1421  BP:  (!) 113/55 118/68 (!) 106/52  Pulse:  71 71 70  Resp:  16 16 18   Temp:  98.1 F (36.7 C) 98.2 F (36.8 C) (!) 97.5 F (36.4 C)  TempSrc:   Oral   SpO2: 99% 96% 99% 99%  Weight:      Height:        Intake/Output Summary (Last 24 hours) at 04/25/2019 1826 Last data filed at 04/25/2019 0200 Gross per 24 hour  Intake 60 ml  Output --  Net 60 ml   Filed Weights   04/23/19 1457  Weight: 79 kg    Examination:  General exam: Appears calm and comfortable  Respiratory system: Clear to auscultation. Respiratory effort normal. Cardiovascular system: S1 & S2 heard, RRR. No JVD, murmurs, rubs, gallops or clicks. 1-2+pedal edema. Gastrointestinal system: Abdomen is nondistended, soft and nontender. No organomegaly or masses felt. Normal bowel sounds heard. Central nervous system: Alert and oriented. No focal neurological deficits. Extremities: Symmetric 5 x 5 power. Skin: No rashes, lesions or ulcers Psychiatry: Judgement and insight appear normal. Mood & affect appropriate.     Data Reviewed: I have personally reviewed following labs and imaging studies  CBC: Recent Labs  Lab 04/23/19 1602 04/24/19 0408  WBC 4.3 4.5  NEUTROABS 2.4  --   HGB 9.7* 9.2*  HCT 31.5* 29.3*  MCV 115.0* 112.7*  PLT 122* 0000000*   Basic Metabolic Panel: Recent Labs  Lab 04/23/19 1602 04/24/19 0408 04/25/19 0549  NA 133* 137 136  K 3.3* 3.5 3.5  CL 95* 97* 99  CO2 28 28 28   GLUCOSE 90 87 94  BUN 21 21 19   CREATININE 1.58* 1.41* 1.48*  CALCIUM 8.1* 8.5* 8.4*   GFR: Estimated Creatinine Clearance: 32.2 mL/min (A) (by C-G formula based on SCr of 1.48 mg/dL (H)). Liver Function Tests: Recent Labs  Lab 04/23/19 1602 04/24/19 0408  AST 25 21  ALT 8 7  ALKPHOS 89 82  BILITOT 1.9* 1.9*  PROT 6.9 6.4*  ALBUMIN 2.4* 2.3*   No results for input(s): LIPASE, AMYLASE in the last 168 hours. No results  for input(s): AMMONIA in the last 168 hours. Coagulation Profile: Recent Labs  Lab 04/23/19 1602 04/24/19 1725 04/25/19 0549  INR 3.9* 3.5* 3.8*   Cardiac Enzymes: No results for input(s): CKTOTAL, CKMB, CKMBINDEX, TROPONINI in the last 168 hours. BNP (last 3 results) No results for input(s): PROBNP in the last 8760 hours. HbA1C: No results for input(s): HGBA1C in the last 72 hours. CBG: Recent Labs  Lab 04/24/19 1621 04/24/19 2115 04/25/19 0739 04/25/19 1153 04/25/19 1700  GLUCAP 96 106* 84 85 81   Lipid Profile: No results for input(s): CHOL, HDL, LDLCALC, TRIG, CHOLHDL, LDLDIRECT in the last 72 hours. Thyroid Function Tests: No results for input(s): TSH, T4TOTAL, FREET4, T3FREE, THYROIDAB in the last 72 hours. Anemia Panel: No results for input(s): VITAMINB12, FOLATE, FERRITIN, TIBC, IRON, RETICCTPCT in the last 72 hours. Sepsis Labs: No results for input(s): PROCALCITON, LATICACIDVEN in the last 168 hours.  Recent Results (from the past 240 hour(s))  SARS CORONAVIRUS 2 (TAT 6-24 HRS) Nasopharyngeal Nasopharyngeal Swab     Status: None   Collection Time: 04/23/19  6:05 PM   Specimen: Nasopharyngeal Swab  Result Value Ref Range Status   SARS Coronavirus 2 NEGATIVE NEGATIVE Final    Comment: (NOTE) SARS-CoV-2 target nucleic acids are NOT DETECTED. The SARS-CoV-2 RNA is generally detectable in upper and lower respiratory specimens during the acute phase of infection. Negative results do not preclude SARS-CoV-2 infection, do not rule out co-infections with other pathogens, and should not be used as the sole basis for treatment or other patient management decisions. Negative results must be combined with clinical observations, patient history, and epidemiological information. The expected result is Negative. Fact Sheet for Patients: SugarRoll.be Fact Sheet for Healthcare Providers: https://www.woods-mathews.com/ This test is  not yet approved or cleared by the [place] FDA and  has been authorized for detection and/or diagnosis of SARS-CoV-2 by FDA under an Emergency Use Authorization (EUA). This EUA will remain  in effect (meaning this test can be used) for the duration of the COVID-19 declaration under Section 56 4(b)(1) of the Act, 21 U.S.C. section 360bbb-3(b)(1), unless the authorization is terminated or revoked sooner. Performed at [facility], [place] [address], [place], [place] [address]          Radiology Studies: DG Chest Port 1 View  Result Date: 04/23/2019 CLINICAL DATA:  Edema.  Bleeding wound. EXAM: PORTABLE CHEST 1 VIEW COMPARISON:  03/27/2019 FINDINGS: Prior median sternotomy. Stable blunting of the right costophrenic angle compatible with moderate right pleural effusion, with associated passive atelectasis. Mild enlargement of the cardiopericardial silhouette. Scarring or atelectasis in the right middle lobe and lingula. IMPRESSION: 1. Stable appearance the chest with at least a moderate right pleural effusion and bibasilar atelectasis or scarring. 2. Stable mild cardiomegaly. Electronically Signed   By: Van Clines M.D.   On: 04/23/2019 18:59   ECHOCARDIOGRAM COMPLETE  Result Date: 04/24/2019   ECHOCARDIOGRAM  REPORT   Patient Name:   Mary [name] Date of Exam: 04/24/2019 Medical Rec #:  [id number]   Height:       70.0 in Accession #:    [id number]  Weight:       174.2 lb Date of Birth:  [birth year]   BSA:          1.97 m Patient Age:    32 years    BP:           109/52 mmHg Patient Gender: F           HR:           71 bpm. Exam Location:  [facility] Procedure: 2D Echo Indications:    CHF-Acute Diastolic [id number] / [id number]  History:        Patient has prior history of Echocardiogram examinations, most                 recent 11/03/2015. Stroke; Risk Factors:Non-Smoker, Diabetes and                 Dyslipidemia. FAll, S/P right knee arthroscopy, (s/p St. Jude                 AVR in 1995.   Sonographer:    Leavy Cella RDCS (AE) Referring Phys: [id number] [place]  1. Left ventricular ejection fraction, by visual estimation, is 60 to 65%. The left ventricle has normal function. There is moderately increased left ventricular hypertrophy.  2. Elevated left ventricular end-diastolic pressure.  3. Left ventricular diastolic parameters are indeterminate.  4. Right ventricular volume and pressure overload.  5. The left ventricle has no regional wall motion abnormalities.  6. Global right ventricle has moderately reduced systolic function.The right ventricular size is severely enlarged. No increase in right ventricular wall thickness.  7. Left atrial size was severely dilated.  8. Right atrial size was severely dilated.  9. Moderate mitral annular calcification. 10. The mitral valve is grossly normal. Moderate mitral valve regurgitation. 11. The tricuspid valve is grossly normal. 12. The tricuspid valve is grossly normal. Tricuspid valve regurgitation is severe. 13. Aortic valve regurgitation is trivial. No evidence of aortic valve sclerosis or stenosis. 14. Pulmonic regurgitation is mild. 15. The pulmonic valve was grossly normal. Pulmonic valve regurgitation is mild. 16. Severely elevated pulmonary artery systolic pressure. 17. The inferior vena cava is dilated in size with <50% respiratory variability, suggesting right atrial pressure of 15 mmHg. 18. There is left bowing of the interatrial septum, suggestive of elevated right atrial pressure. FINDINGS  Left Ventricle: Left ventricular ejection fraction, by visual estimation, is 60 to 65%. The left ventricle has normal function. The left ventricle has no regional wall motion abnormalities. The left ventricular internal cavity size was the left ventricle is normal in size. There is moderately increased left ventricular hypertrophy. Asymmetric left ventricular hypertrophy of the posterior wall. The interventricular septum is flattened in  systole and diastole, consistent with right ventricular pressure and volume overload. Left ventricular diastolic parameters are indeterminate. Elevated left ventricular end-diastolic pressure. Right Ventricle: The right ventricular size is severely enlarged. No increase in right ventricular wall thickness. Global RV systolic function is has moderately reduced systolic function. The tricuspid regurgitant velocity is 3.99 m/s, and with an assumed right atrial pressure of 10 mmHg, the estimated right ventricular systolic pressure is severely elevated at 73.6 mmHg. Left Atrium: Left atrial size was severely dilated. Right Atrium: Right atrial size was severely dilated Pericardium:  There is no evidence of pericardial effusion. Mitral Valve: The mitral valve is grossly normal. Moderate mitral annular calcification. Moderate mitral valve regurgitation. Tricuspid Valve: The tricuspid valve is grossly normal. Tricuspid valve regurgitation is severe. Aortic Valve: The aortic valve has been repaired/replaced. Aortic valve regurgitation is trivial. The aortic valve is structurally normal, with no evidence of sclerosis or stenosis. Aortic valve mean gradient measures 5.7 mmHg. Aortic valve peak gradient  measures 8.2 mmHg. St. Jude valve is present in the aortic position. Echo findings show normal structure and function of the aortic prosthesis. Pulmonic Valve: The pulmonic valve was grossly normal. Pulmonic valve regurgitation is mild. Pulmonic regurgitation is mild. Aorta: The aortic root is normal in size and structure. Venous: The inferior vena cava is dilated in size with less than 50% respiratory variability, suggesting right atrial pressure of 15 mmHg. IAS/Shunts: There is left bowing of the interatrial septum, suggestive of elevated right atrial pressure. No atrial level shunt detected by color flow Doppler.  LEFT VENTRICLE PLAX 2D LVIDd:         3.57 cm Diastology LVIDs:         2.56 cm LV e' lateral:   8.41 cm/s LV PW:          1.46 cm LV E/e' lateral: 15.3 LV IVS:        0.87 cm LV e' medial:    5.96 cm/s LV SV:         30 ml   LV E/e' medial:  21.6 LV SV Index:   14.96  RIGHT VENTRICLE RV S prime:     7.18 cm/s TAPSE (M-mode): 1.1 cm LEFT ATRIUM             Index       RIGHT ATRIUM           Index LA diam:        4.20 cm 2.13 cm/m  RA Area:     30.40 cm LA Vol (A2C):   93.1 ml 47.30 ml/m RA Volume:   114.00 ml 57.92 ml/m LA Vol (A4C):   89.4 ml 45.42 ml/m LA Biplane Vol: 96.3 ml 48.93 ml/m  AORTIC VALVE AV Vmax:           143.33 cm/s AV Vmean:          112.667 cm/s AV VTI:            0.332 m AV Peak Grad:      8.2 mmHg AV Mean Grad:      5.7 mmHg LVOT Vmax:         65.57 cm/s LVOT Vmean:        51.967 cm/s LVOT VTI:          0.143 m LVOT/AV VTI ratio: 0.43  AORTA Ao Root diam: 2.50 cm MITRAL VALVE                         TRICUSPID VALVE MV Area (PHT): 4.68 cm              TR Peak grad:   63.6 mmHg MV PHT:        46.98 msec            TR Vmax:        423.00 cm/s MV Decel Time: 162 msec MV E velocity: 129.00 cm/s 103 cm/s  SHUNTS MV A velocity: 37.80 cm/s  70.3 cm/s Systemic VTI: 0.14 m MV E/A ratio:  3.41  1.5  Kate Sable MD Electronically signed by Kate Sable MD Signature Date/Time: 04/24/2019/10:57:02 AM    Final         Scheduled Meds: . feeding supplement (ENSURE ENLIVE)  237 mL Oral BID BM  . fluticasone  1 spray Each Nare QHS  . furosemide  40 mg Intravenous BID  . HYDROcodone-acetaminophen  1 tablet Oral BID  . loratadine  10 mg Oral Daily  . metoprolol succinate  100 mg Oral q morning - 10a  . multivitamin with minerals  1 tablet Oral Daily  . pantoprazole  40 mg Oral QAC breakfast  . polyethylene glycol  17 g Oral Daily  . simvastatin  40 mg Oral Q2000  . sodium chloride flush  3 mL Intravenous Q12H   Continuous Infusions: . sodium chloride    . albumin human 25 g (04/25/19 1612)     LOS: 2 days    Time spent: 50mins    Kathie Dike, MD [facility]   If  7PM-7AM, please contact night-coverage [email or web address]  04/25/2019, 6:26 PM

## 2019-04-25 NOTE — Care Management Important Message (Signed)
Important Message  Patient Details  Name: Mary [name] MRN: [id number] Date of Birth: [birth year]   Medicare Important Message Given:  Yes     Tommy Medal 04/25/2019, 10:23 AM

## 2019-04-25 NOTE — Progress Notes (Signed)
Assist OOB and walked with walker at slow, steady pace to bathroom.  Encouraged to use call light and placed call cord in lap and told to use it to get help back to bed.  Stated she understood and would do that and in few minutes she was sitting back on her bed without having asked for help.  Encouraged again to use call light and call light placed in lap and bed alarm set.

## 2019-04-25 NOTE — Progress Notes (Signed)
[facility] will sign off.   Medication Recommendations: See consult note on 04/24/2019 Other recommendations (labs, testing, etc): None Follow up as an outpatient: We will arrange

## 2019-04-25 NOTE — Progress Notes (Signed)
Physical Therapy Treatment Patient Details Name: Mary [name] MRN: [id number] DOB: [birth year] Today's Date: 04/25/2019    History of Present Illness Mary [name]  is a 79 y.o. female, with history of aortic valve replacement, metal valve placed in 1995/96, paroxysmal atrial fibrillation, hypertension, hyperlipidemia came to hospital with complaints of worsening leg swelling, fatigue, bleeding sacral wound.  Patient denies shortness of breath, complains of intermittent chest pain.  Denies nausea vomiting or diarrhea.  No abdominal pain.  Patient says that she fell few weeks ago and has felt generally weak after that.  She has had difficulty walking.In the ED, BNP was elevated to 584, chest x-ray showed stable appearance of the chest with at least moderate right pleural effusion and bibasilar atelectasis or scarring.Lab work showed creatinine 1.58, worse from previous creatinine of 1.43 on 03/27/2019.Patient is not requiring oxygen.  She is not short of breath.    PT Comments    Patient demonstrates good return for completing BLE ROM/strengthening exercises with verbal cues while seated at bedside, slightly labored movement for sit to stands, transfers and ambulation in hallway without loss of balance, limited for gait training secondary to c/o fatigue and tolerated sitting up in chair with her spouse in room air therapy.  Patient will benefit from continued physical therapy in hospital and recommended venue below to increase strength, balance, endurance for safe ADLs and gait.    Follow Up Recommendations  Home health PT;Supervision for mobility/OOB;Supervision - Intermittent     Equipment Recommendations  None recommended by PT    Recommendations for Other Services       Precautions / Restrictions Precautions Precautions: Fall Restrictions Weight Bearing Restrictions: No    Mobility  Bed Mobility Overal bed mobility: Needs Assistance Bed Mobility: Supine to Sit     Supine to sit: HOB  elevated;Supervision     General bed mobility comments: increased time, labored movement  Transfers Overall transfer level: Needs assistance Equipment used: Rolling walker (2 wheeled) Transfers: Sit to/from [employer] Transfers Sit to Stand: Supervision;Min guard Stand pivot transfers: Supervision;Min guard       General transfer comment: slightly labored slow movement with occasional verbal/tactile cueing for safety  Ambulation/Gait Ambulation/Gait assistance: Supervision Gait Distance (Feet): 47 Feet Assistive device: Rolling walker (2 wheeled) Gait Pattern/deviations: Decreased step length - right;Decreased step length - left;Decreased stride length Gait velocity: decreased   General Gait Details: slow slightly labored cadence without loss of balance, limited secondary to c/o fatigue   Stairs             Wheelchair Mobility    Modified Rankin (Stroke Patients Only)       Balance Overall balance assessment: Needs assistance Sitting-balance support: Feet supported;No upper extremity supported Sitting balance-Leahy Scale: Good Sitting balance - Comments: seated at bedside   Standing balance support: During functional activity;Bilateral upper extremity supported   Standing balance comment: using RW                            Cognition Arousal/Alertness: Awake/alert Behavior During Therapy: WFL for tasks assessed/performed Overall Cognitive Status: Within Functional Limits for tasks assessed                                        Exercises General Exercises - Lower Extremity Long Arc Quad: Seated;AROM;Strengthening;Both;15 reps Hip Flexion/Marching: Seated;AROM;Strengthening;Both;15 reps Toe Raises: Seated;AROM;Strengthening;Both;15  reps Heel Raises: Seated;AROM;Strengthening;Both;15 reps    General Comments        Pertinent Vitals/Pain Pain Assessment: No/denies pain    Home Living                       Prior Function            PT Goals (current goals can now be found in the care plan section) Acute Rehab PT Goals Patient Stated Goal: return home with family to assist PT Goal Formulation: With patient Time For Goal Achievement: 04/28/19 Potential to Achieve Goals: Good Progress towards PT goals: Progressing toward goals    Frequency    Min 3X/week      PT Plan      Co-evaluation              AM-PAC PT "6 Clicks" Mobility   Outcome Measure  Help needed turning from your back to your side while in a flat bed without using bedrails?: None Help needed moving from lying on your back to sitting on the side of a flat bed without using bedrails?: A Little Help needed moving to and from a bed to a chair (including a wheelchair)?: A Little Help needed standing up from a chair using your arms (e.g., wheelchair or bedside chair)?: A Little Help needed to walk in hospital room?: A Little Help needed climbing 3-5 steps with a railing? : A Lot 6 Click Score: 18    End of Session   Activity Tolerance: Patient tolerated treatment well;Patient limited by fatigue Patient left: in chair;with call bell/phone within reach;with family/visitor present Nurse Communication: Mobility status PT Visit Diagnosis: Unsteadiness on feet (R26.81);Other abnormalities of gait and mobility (R26.89);Muscle weakness (generalized) (M62.81)     Time: [id number] PT Time Calculation (min) (ACUTE ONLY): 24 min  Charges:  $Gait Training: 8-22 mins $Therapeutic Exercise: 8-22 mins                     1:53 PM, 04/25/19 Mary [name], MPT Physical Therapist with [facility] 336 [phone] office [phone] mobile phone

## 2019-04-25 NOTE — Progress Notes (Signed)
ANTICOAGULATION CONSULT NOTE -  Pharmacy Consult for Coumadin  Indication: atrial fibrillation/AVR  Allergies  Allergen Reactions  . Tape Itching    Redness, Please use "paper" tape only    Patient Measurements: Height: 5\' 10"  (177.8 cm) Weight: 174 lb 2.6 oz (79 kg) IBW/kg (Calculated) : 68.5 Heparin Dosing Weight:   Vital Signs: Temp: 98.2 F (36.8 C) (02/05 0559) Temp Source: Oral (02/05 0559) BP: 118/68 (02/05 0559) Pulse Rate: 71 (02/05 0559)  Labs: Recent Labs    04/23/19 1602 04/24/19 0408 04/24/19 1725 04/25/19 0549  HGB 9.7* 9.2*  --   --   HCT 31.5* 29.3*  --   --   PLT 122* 125*  --   --   LABPROT 37.9*  --  35.4* 37.6*  INR 3.9*  --  3.5* 3.8*  CREATININE 1.58* 1.41*  --  1.48*    Estimated Creatinine Clearance: 32.2 mL/min (A) (by C-G formula based on SCr of 1.48 mg/dL (H)).   Medical History: Past Medical History:  Diagnosis Date  . Anxiety   . Diabetes mellitus with neuropathy ([place])   . Diabetic neuropathy ([place])   . DJD (degenerative joint disease)   . H/O aortic valve replacement   . Hyperlipidemia   . Hypertension   . Neuromuscular disorder (HCC)    neuropathy in feet  . Obesity   . Sleep apnea   . Stroke [facility])    " light stroke"  . Wears glasses     Medications:  Medications Prior to Admission  Medication Sig Dispense Refill Last Dose  . acetaminophen (TYLENOL) 500 MG tablet Take 500 mg by mouth 2 (two) times daily as needed for mild pain or moderate pain.    unknown at Unknown time  . ALPRAZolam (XANAX) 0.5 MG tablet Take 0.5 mg by mouth at bedtime as needed for sleep.    04/22/2019 at Unknown time  . bisacodyl (DULCOLAX) 5 MG EC tablet Take 5 mg by mouth daily as needed for moderate constipation.   Past Week at Unknown time  . cetirizine (ZYRTEC) 10 MG tablet Take 10 mg by mouth at bedtime as needed for allergies.   11 unknown  . fluticasone (FLONASE) 50 MCG/ACT nasal spray Place 1 spray into both nostrils at bedtime.   11 04/22/2019  at Unknown time  . HYDROcodone-acetaminophen (NORCO) 10-325 MG tablet Take 1 tablet by mouth 2 (two) times daily.   0 04/23/2019 at Unknown time  . metolazone (ZAROXOLYN) 10 MG tablet Take 10 mg by mouth every morning.   04/23/2019 at Unknown time  . metoprolol succinate (TOPROL-XL) 50 MG 24 hr tablet Take 50 mg by mouth every morning. Take with or immediately following a meal.    04/23/2019 at 815a  . pantoprazole (PROTONIX) 40 MG tablet Take 1 tablet (40 mg total) by mouth daily before breakfast. 30 tablet 5 04/23/2019 at Unknown time  . polyethylene glycol powder (GLYCOLAX/MIRALAX) 17 GM/SCOOP powder Take 17 g by mouth daily.   Past Week at Unknown time  . potassium chloride (KLOR-CON) 10 MEQ tablet Take 10 mEq by mouth every morning.   04/23/2019 at Unknown time  . simvastatin (ZOCOR) 40 MG tablet Take 40 mg by mouth daily at 8 pm.    04/22/2019 at Unknown time  . torsemide (DEMADEX) 20 MG tablet Take 2 tablets (40 mg total) by mouth 2 (two) times daily. 360 tablet 3 04/23/2019 at Unknown time  . Vitamin D, Ergocalciferol, (DRISDOL) 1.25 MG (50000 UT) CAPS capsule Take  50,000 Units by mouth every Sunday.    04/20/2019 at Unknown time  . warfarin (COUMADIN) 2 MG tablet Take 1 tablet daily or as directed by Coumadin clinic (Patient taking differently: Take 1-2 mg by mouth See admin instructions. 2mg  on Sundays. Takes 1mg  on all other days. Takes in the morning) 45 tablet 6 04/23/2019 at 815a  . metoprolol succinate (TOPROL XL) 25 MG 24 hr tablet Take 1 tablet (25 mg total) by mouth daily. (Patient not taking: Reported on 04/23/2019) 30 tablet 3 Not Taking at Unknown time   Scheduled:  . feeding supplement (ENSURE ENLIVE)  237 mL Oral BID BM  . fluticasone  1 spray Each Nare QHS  . HYDROcodone-acetaminophen  1 tablet Oral BID  . loratadine  10 mg Oral Daily  . metoprolol succinate  100 mg Oral q morning - 10a  . multivitamin with minerals  1 tablet Oral Daily  . pantoprazole  40 mg Oral QAC breakfast  .  polyethylene glycol  17 g Oral Daily  . simvastatin  40 mg Oral Q2000  . sodium chloride flush  3 mL Intravenous Q12H   Infusions:  . sodium chloride      Assessment: Grae presented with sacral wound issue (bleeding) for the last several weeks. She has been on coumadin for AVR/afib. Last dose 2/3.  INR supratherapeutic at 3.8.  PTA dose: 2 mg on Sun- 1 mg ROW  Goal of Therapy:  INR 2.5-3.5 Monitor platelets by anticoagulation protocol: Yes   Plan:  Hold warfarin x 1 dose  Monitor daily INR and s/s of bleeding.  Margot Ables, PharmD Clinical Pharmacist 04/25/2019 9:20 AM

## 2019-04-26 LAB — BASIC METABOLIC PANEL
Anion gap: 10 (ref 5–15)
BUN: 19 mg/dL (ref 8–23)
CO2: 28 mmol/L (ref 22–32)
Calcium: 8.8 mg/dL — ABNORMAL LOW (ref 8.9–10.3)
Chloride: 99 mmol/L (ref 98–111)
Creatinine, Ser: 1.36 mg/dL — ABNORMAL HIGH (ref 0.44–1.00)
GFR calc Af Amer: 42 mL/min — ABNORMAL LOW (ref >60)
GFR calc non Af Amer: 36 mL/min — ABNORMAL LOW (ref >60)
Glucose, Bld: 82 mg/dL (ref 70–99)
Potassium: 3.3 mmol/L — ABNORMAL LOW (ref 3.5–5.1)
Sodium: 137 mmol/L (ref 135–145)

## 2019-04-26 LAB — PROTIME-INR
INR: 3 — ABNORMAL HIGH (ref 0.8–1.2)
Prothrombin Time: 31 seconds — ABNORMAL HIGH (ref 11.4–15.2)

## 2019-04-26 LAB — URINALYSIS, ROUTINE W REFLEX MICROSCOPIC
Bilirubin Urine: NEGATIVE
Glucose, UA: NEGATIVE mg/dL
Hgb urine dipstick: NEGATIVE
Ketones, ur: NEGATIVE mg/dL
Leukocytes,Ua: NEGATIVE
Nitrite: NEGATIVE
Protein, ur: NEGATIVE mg/dL
Specific Gravity, Urine: 1.005 (ref 1.005–1.030)
pH: 6 (ref 5.0–8.0)

## 2019-04-26 MED ORDER — WARFARIN SODIUM 1 MG PO TABS
1.0000 mg | ORAL_TABLET | Freq: Once | ORAL | Status: AC
  Administered 2019-04-26: 1 mg via ORAL
  Filled 2019-04-26: qty 1

## 2019-04-26 MED ORDER — POTASSIUM CHLORIDE CRYS ER 20 MEQ PO TBCR
40.0000 meq | EXTENDED_RELEASE_TABLET | Freq: Two times a day (BID) | ORAL | Status: DC
  Administered 2019-04-26 – 2019-04-27 (×3): 40 meq via ORAL
  Filled 2019-04-26 (×3): qty 2

## 2019-04-26 MED ORDER — WARFARIN - PHARMACIST DOSING INPATIENT: Freq: Every day | Status: DC

## 2019-04-26 NOTE — Progress Notes (Signed)
ANTICOAGULATION CONSULT NOTE -  Pharmacy Consult for Coumadin  Indication: atrial fibrillation/AVR  Allergies  Allergen Reactions  . Tape Itching    Redness, Please use "paper" tape only    Patient Measurements: Height: 5\' 10"  (177.8 cm) Weight: 174 lb 2.6 oz (79 kg) IBW/kg (Calculated) : 68.5  Vital Signs: Temp: 97.2 F (36.2 C) (02/06 0610) BP: 107/91 (02/06 0833) Pulse Rate: 79 (02/06 0833)  Labs: Recent Labs    04/23/19 1602 04/23/19 1602 04/24/19 0408 04/24/19 1725 04/25/19 0549 04/26/19 0607  HGB 9.7*  --  9.2*  --   --   --   HCT 31.5*  --  29.3*  --   --   --   PLT 122*  --  125*  --   --   --   LABPROT 37.9*   < >  --  35.4* 37.6* 31.0*  INR 3.9*   < >  --  3.5* 3.8* 3.0*  CREATININE 1.58*   < > 1.41*  --  1.48* 1.36*   < > = values in this interval not displayed.    Estimated Creatinine Clearance: 35.1 mL/min (A) (by C-G formula based on SCr of 1.36 mg/dL (H)).   Medical History: Past Medical History:  Diagnosis Date  . Anxiety   . Diabetes mellitus with neuropathy ([place])   . Diabetic neuropathy ([place])   . DJD (degenerative joint disease)   . H/O aortic valve replacement   . Hyperlipidemia   . Hypertension   . Neuromuscular disorder (HCC)    neuropathy in feet  . Obesity   . Sleep apnea   . Stroke [facility])    " light stroke"  . Wears glasses     Medications:  Medications Prior to Admission  Medication Sig Dispense Refill Last Dose  . acetaminophen (TYLENOL) 500 MG tablet Take 500 mg by mouth 2 (two) times daily as needed for mild pain or moderate pain.    unknown at Unknown time  . ALPRAZolam (XANAX) 0.5 MG tablet Take 0.5 mg by mouth at bedtime as needed for sleep.    04/22/2019 at Unknown time  . bisacodyl (DULCOLAX) 5 MG EC tablet Take 5 mg by mouth daily as needed for moderate constipation.   Past Week at Unknown time  . cetirizine (ZYRTEC) 10 MG tablet Take 10 mg by mouth at bedtime as needed for allergies.   11 unknown  . fluticasone  (FLONASE) 50 MCG/ACT nasal spray Place 1 spray into both nostrils at bedtime.   11 04/22/2019 at Unknown time  . HYDROcodone-acetaminophen (NORCO) 10-325 MG tablet Take 1 tablet by mouth 2 (two) times daily.   0 04/23/2019 at Unknown time  . metolazone (ZAROXOLYN) 10 MG tablet Take 10 mg by mouth every morning.   04/23/2019 at Unknown time  . metoprolol succinate (TOPROL-XL) 50 MG 24 hr tablet Take 50 mg by mouth every morning. Take with or immediately following a meal.    04/23/2019 at 815a  . pantoprazole (PROTONIX) 40 MG tablet Take 1 tablet (40 mg total) by mouth daily before breakfast. 30 tablet 5 04/23/2019 at Unknown time  . polyethylene glycol powder (GLYCOLAX/MIRALAX) 17 GM/SCOOP powder Take 17 g by mouth daily.   Past Week at Unknown time  . potassium chloride (KLOR-CON) 10 MEQ tablet Take 10 mEq by mouth every morning.   04/23/2019 at Unknown time  . simvastatin (ZOCOR) 40 MG tablet Take 40 mg by mouth daily at 8 pm.    04/22/2019 at Unknown time  .  torsemide (DEMADEX) 20 MG tablet Take 2 tablets (40 mg total) by mouth 2 (two) times daily. 360 tablet 3 04/23/2019 at Unknown time  . Vitamin D, Ergocalciferol, (DRISDOL) 1.25 MG (50000 UT) CAPS capsule Take 50,000 Units by mouth every Sunday.    04/20/2019 at Unknown time  . warfarin (COUMADIN) 2 MG tablet Take 1 tablet daily or as directed by Coumadin clinic (Patient taking differently: Take 1-2 mg by mouth See admin instructions. 2mg  on Sundays. Takes 1mg  on all other days. Takes in the morning) 45 tablet 6 04/23/2019 at 815a  . metoprolol succinate (TOPROL XL) 25 MG 24 hr tablet Take 1 tablet (25 mg total) by mouth daily. (Patient not taking: Reported on 04/23/2019) 30 tablet 3 Not Taking at Unknown time   Scheduled:  . feeding supplement (ENSURE ENLIVE)  237 mL Oral BID BM  . fluticasone  1 spray Each Nare QHS  . furosemide  40 mg Intravenous BID  . HYDROcodone-acetaminophen  1 tablet Oral BID  . loratadine  10 mg Oral Daily  . metoprolol succinate  100 mg  Oral q morning - 10a  . multivitamin with minerals  1 tablet Oral Daily  . pantoprazole  40 mg Oral QAC breakfast  . polyethylene glycol  17 g Oral Daily  . simvastatin  40 mg Oral Q2000  . sodium chloride flush  3 mL Intravenous Q12H   Infusions:  . sodium chloride    . albumin human 60 mL/hr at 04/26/19 0300    Assessment: Mary [name] presented with sacral wound issue (bleeding) for the last several weeks. She has been on coumadin for AVR/afib. Last dose 2/3.  INR therapeutic at 3.0.  PTA dose: 2 mg on Sun- 1 mg ROW  Goal of Therapy:  INR 2.5-3.5 Monitor platelets by anticoagulation protocol: Yes   Plan:  warfarin 1mg  today Monitor daily INR and s/s of bleeding.  Isac Sarna, BS Pharm D, [place] Clinical Pharmacist Pager [phone] 04/26/2019 9:09 AM

## 2019-04-26 NOTE — Progress Notes (Signed)
PROGRESS NOTE    Mary [name]  [id number] DOB: 09-12-1940 DOA: 04/23/2019 PCP: [name], MD    Brief Narrative:  79 year old female who is admitted to the hospital after suffering a fall.  She is on chronic torsemide and was recently started on metolazone for progressive anasarca/lower extremity edema.  It was felt that she may have developed some intravascular volume depletion and hypovolemia.  She was admitted to the hospital and diuretics were held.  She was seen by cardiology and underwent echocardiogram.  She is having started on intravenous albumin as well as IV Lasix and she still has evidence of volume overload.   Assessment & Plan:   Active Problems:   Essential hypertension   S/P aortic valve replacement   Fall   Pressure injury of skin   Anasarca   Hypoalbuminemia   Persistent atrial fibrillation (HCC)   CKD (chronic kidney disease), stage III   Anemia due to chronic kidney disease   1. Anasarca.  Suspect is related to hypoalbuminemia.  Check urinalysis to evaluate for proteinuria.  Started on albumin infusions and intravenous Lasix.  Urine output has not been accurately recorded.  Will request strict I's and O's.  Clinically, her edema appears to be improving 2. Chronic kidney disease stage III.  Creatinine is currently near baseline.  Continue to monitor. 3. Anemia due to chronic kidney disease.  Currently stable.  No signs of bleeding. 4. Persistent atrial fibrillation.  Heart rate is currently stable.  Anticoagulated with Coumadin. 5. Status post aortic valve replacement.  Continue on anticoagulation. 6. Hypertension.  Blood pressures currently stable.  Continue to monitor.   DVT prophylaxis: Coumadin Code Status: Full code Family Communication: Discussed with husband over the phone Disposition Plan: Discharge home once she has been adequately diuresed   Consultants:   Cardiology  Procedures:   Echocardiogram  Antimicrobials:       Subjective: No shortness of breath or chest pain.  Continues to have lower extremity edema.  Objective: Vitals:   04/26/19 0610 04/26/19 0752 04/26/19 0833 04/26/19 1338  BP: 105/61  (!) 107/91 112/62  Pulse: 71  79 83  Resp: 18   17  Temp: (!) 97.2 F (36.2 C)   97.9 F (36.6 C)  TempSrc:      SpO2: 96% 97%  92%  Weight:      Height:        Intake/Output Summary (Last 24 hours) at 04/26/2019 1652 Last data filed at 04/26/2019 1300 Gross per 24 hour  Intake 599.77 ml  Output --  Net 599.77 ml   Filed Weights   04/23/19 1457  Weight: 79 kg    Examination:  General exam: Alert, awake, oriented x 3 Respiratory system: Clear to auscultation. Respiratory effort normal. Cardiovascular system:RRR. No murmurs, rubs, gallops. Gastrointestinal system: Abdomen is nondistended, soft and nontender. No organomegaly or masses felt. Normal bowel sounds heard. Central nervous system: Alert and oriented. No focal neurological deficits. Extremities: 1-2+ pedal edema bilaterally with anasarca Skin: No rashes, lesions or ulcers Psychiatry: Judgement and insight appear normal. Mood & affect appropriate.       Data Reviewed: I have personally reviewed following labs and imaging studies  CBC: Recent Labs  Lab 04/23/19 1602 04/24/19 0408  WBC 4.3 4.5  NEUTROABS 2.4  --   HGB 9.7* 9.2*  HCT 31.5* 29.3*  MCV 115.0* 112.7*  PLT 122* 0000000*   Basic Metabolic Panel: Recent Labs  Lab 04/23/19 1602 04/24/19 0408 04/25/19 0549 04/26/19 [id number]  NA 133* 137 136 137  K 3.3* 3.5 3.5 3.3*  CL 95* 97* 99 99  CO2 28 28 28 28   GLUCOSE 90 87 94 82  BUN 21 21 19 19   CREATININE 1.58* 1.41* 1.48* 1.36*  CALCIUM 8.1* 8.5* 8.4* 8.8*   GFR: Estimated Creatinine Clearance: 35.1 mL/min (A) (by C-G formula based on SCr of 1.36 mg/dL (H)). Liver Function Tests: Recent Labs  Lab 04/23/19 1602 04/24/19 0408  AST 25 21  ALT 8 7  ALKPHOS 89 82  BILITOT 1.9* 1.9*  PROT 6.9 6.4*   ALBUMIN 2.4* 2.3*   No results for input(s): LIPASE, AMYLASE in the last 168 hours. No results for input(s): AMMONIA in the last 168 hours. Coagulation Profile: Recent Labs  Lab 04/23/19 1602 04/24/19 1725 04/25/19 0549 04/26/19 0607  INR 3.9* 3.5* 3.8* 3.0*   Cardiac Enzymes: No results for input(s): CKTOTAL, CKMB, CKMBINDEX, TROPONINI in the last 168 hours. BNP (last 3 results) No results for input(s): PROBNP in the last 8760 hours. HbA1C: No results for input(s): HGBA1C in the last 72 hours. CBG: Recent Labs  Lab 04/24/19 1621 04/24/19 2115 04/25/19 0739 04/25/19 1153 04/25/19 1700  GLUCAP 96 106* 84 85 81   Lipid Profile: No results for input(s): CHOL, HDL, LDLCALC, TRIG, CHOLHDL, LDLDIRECT in the last 72 hours. Thyroid Function Tests: No results for input(s): TSH, T4TOTAL, FREET4, T3FREE, THYROIDAB in the last 72 hours. Anemia Panel: No results for input(s): VITAMINB12, FOLATE, FERRITIN, TIBC, IRON, RETICCTPCT in the last 72 hours. Sepsis Labs: No results for input(s): PROCALCITON, LATICACIDVEN in the last 168 hours.  Recent Results (from the past 240 hour(s))  SARS CORONAVIRUS 2 (TAT 6-24 HRS) Nasopharyngeal Nasopharyngeal Swab     Status: None   Collection Time: 04/23/19  6:05 PM   Specimen: Nasopharyngeal Swab  Result Value Ref Range Status   SARS Coronavirus 2 NEGATIVE NEGATIVE Final    Comment: (NOTE) SARS-CoV-2 target nucleic acids are NOT DETECTED. The SARS-CoV-2 RNA is generally detectable in upper and lower respiratory specimens during the acute phase of infection. Negative results do not preclude SARS-CoV-2 infection, do not rule out co-infections with other pathogens, and should not be used as the sole basis for treatment or other patient management decisions. Negative results must be combined with clinical observations, patient history, and epidemiological information. The expected result is Negative. Fact Sheet for  Patients: SugarRoll.be Fact Sheet for Healthcare Providers: https://www.woods-mathews.com/ This test is not yet approved or cleared by the [place] FDA and  has been authorized for detection and/or diagnosis of SARS-CoV-2 by FDA under an Emergency Use Authorization (EUA). This EUA will remain  in effect (meaning this test can be used) for the duration of the COVID-19 declaration under Section 56 4(b)(1) of the Act, 21 U.S.C. section 360bbb-3(b)(1), unless the authorization is terminated or revoked sooner. Performed at [facility], [place] [address] [place], [place] [address]          Radiology Studies: No results found.      Scheduled Meds: . feeding supplement (ENSURE ENLIVE)  237 mL Oral BID BM  . fluticasone  1 spray Each Nare QHS  . furosemide  40 mg Intravenous BID  . HYDROcodone-acetaminophen  1 tablet Oral BID  . loratadine  10 mg Oral Daily  . metoprolol succinate  100 mg Oral q morning - 10a  . multivitamin with minerals  1 tablet Oral Daily  . pantoprazole  40 mg Oral QAC breakfast  . polyethylene  glycol  17 g Oral Daily  . potassium chloride  40 mEq Oral BID  . simvastatin  40 mg Oral Q2000  . sodium chloride flush  3 mL Intravenous Q12H  . Warfarin - Pharmacist Dosing Inpatient   Does not apply q1800   Continuous Infusions: . sodium chloride       LOS: 3 days    Time spent: 70mins    Kathie Dike, MD [facility]   If 7PM-7AM, please contact night-coverage [email or web address]  04/26/2019, 4:52 PM

## 2019-04-27 DIAGNOSIS — Z952 Presence of prosthetic heart valve: Secondary | ICD-10-CM

## 2019-04-27 LAB — GLUCOSE, CAPILLARY
Glucose-Capillary: 76 mg/dL (ref 70–99)
Glucose-Capillary: 99 mg/dL (ref 70–99)

## 2019-04-27 LAB — BASIC METABOLIC PANEL
Anion gap: 12 (ref 5–15)
BUN: 20 mg/dL (ref 8–23)
CO2: 26 mmol/L (ref 22–32)
Calcium: 8.9 mg/dL (ref 8.9–10.3)
Chloride: 100 mmol/L (ref 98–111)
Creatinine, Ser: 1.43 mg/dL — ABNORMAL HIGH (ref 0.44–1.00)
GFR calc Af Amer: 40 mL/min — ABNORMAL LOW (ref >60)
GFR calc non Af Amer: 34 mL/min — ABNORMAL LOW (ref >60)
Glucose, Bld: 73 mg/dL (ref 70–99)
Potassium: 4.4 mmol/L (ref 3.5–5.1)
Sodium: 138 mmol/L (ref 135–145)

## 2019-04-27 MED ORDER — METOLAZONE 10 MG PO TABS: 5.0000 mg | ORAL_TABLET | Freq: Every day | ORAL | Status: DC | PRN

## 2019-04-27 MED ORDER — METOPROLOL SUCCINATE ER 50 MG PO TB24: 100.0000 mg | ORAL_TABLET | Freq: Every morning | ORAL | 0 refills | Status: DC

## 2019-04-27 MED ORDER — WARFARIN SODIUM 1 MG PO TABS: 1.0000 mg | ORAL_TABLET | Freq: Once | ORAL | Status: DC

## 2019-04-27 NOTE — TOC Transition Note (Signed)
Transition of Care [facility]) - CM/SW Discharge Note   Patient Details  Name: Mary [name] MRN: [id number] Date of Birth: 1940-07-06  [facility] CM/SW Contact:  Boneta Lucks, RN Phone Number: 04/27/2019, 11:07 AM   Clinical Narrative:  Patient discharging home today. New Referral to Trainer for PT/RN/Aide. Added to AVS.      Final next level of care: [place] Barriers to Discharge: Barriers Resolved   Patient Goals and CMS Choice Patient states their goals for this hospitalization and ongoing recovery are:: to go home. CMS Medicare.gov Compare Post Acute Care list provided to:: Patient Represenative (must comment) Choice offered to / list presented to : Adult Children  Discharge Placement                Patient to be transferred to facility by: family   Patient and family notified of of transfer: 04/27/19  Discharge Plan and Services      HH Arranged: PT, RN, Nurse's Aide [place] Agency: [place] ([place]) Date [place]: 04/25/19 Time [place]: [id number] Representative spoke with at [place]: Romualdo Bolk Readmission Risk Interventions No flowsheet data found.

## 2019-04-27 NOTE — Progress Notes (Signed)
Discharge instructions given to pat ietn and husband Mary [name]. Verbalized understanding of new meds and changes in current meds. Follow up appointments reviewed. No acute distress noted. IV removed form rt hand with cath intact. Pt dressed in street clothes by [employer].  Left unit via w/c. No acute distress noted

## 2019-04-27 NOTE — Progress Notes (Signed)
ANTICOAGULATION CONSULT NOTE -  Pharmacy Consult for Coumadin  Indication: atrial fibrillation/AVR  Allergies  Allergen Reactions  . Tape Itching    Redness, Please use "paper" tape only    Patient Measurements: Height: 5\' 10"  (177.8 cm) Weight: 176 lb 5.9 oz (80 kg) IBW/kg (Calculated) : 68.5  Vital Signs: Temp: 98 F (36.7 C) (02/07 0512) BP: 113/52 (02/07 0512) Pulse Rate: 70 (02/07 0512)  Labs: Recent Labs    04/24/19 1725 04/25/19 0549 04/26/19 0607 04/27/19 0554  LABPROT 35.4* 37.6* 31.0*  --   INR 3.5* 3.8* 3.0*  --   CREATININE  --  1.48* 1.36* 1.43*    Estimated Creatinine Clearance: 33.4 mL/min (A) (by C-G formula based on SCr of 1.43 mg/dL (H)).   Medical History: Past Medical History:  Diagnosis Date  . Anxiety   . Diabetes mellitus with neuropathy ([place])   . Diabetic neuropathy ([place])   . DJD (degenerative joint disease)   . H/O aortic valve replacement   . Hyperlipidemia   . Hypertension   . Neuromuscular disorder (HCC)    neuropathy in feet  . Obesity   . Sleep apnea   . Stroke [facility])    " light stroke"  . Wears glasses     Medications:  Medications Prior to Admission  Medication Sig Dispense Refill Last Dose  . acetaminophen (TYLENOL) 500 MG tablet Take 500 mg by mouth 2 (two) times daily as needed for mild pain or moderate pain.    unknown at Unknown time  . ALPRAZolam (XANAX) 0.5 MG tablet Take 0.5 mg by mouth at bedtime as needed for sleep.    04/22/2019 at Unknown time  . bisacodyl (DULCOLAX) 5 MG EC tablet Take 5 mg by mouth daily as needed for moderate constipation.   Past Week at Unknown time  . cetirizine (ZYRTEC) 10 MG tablet Take 10 mg by mouth at bedtime as needed for allergies.   11 unknown  . fluticasone (FLONASE) 50 MCG/ACT nasal spray Place 1 spray into both nostrils at bedtime.   11 04/22/2019 at Unknown time  . HYDROcodone-acetaminophen (NORCO) 10-325 MG tablet Take 1 tablet by mouth 2 (two) times daily.   0 04/23/2019 at Unknown  time  . metolazone (ZAROXOLYN) 10 MG tablet Take 10 mg by mouth every morning.   04/23/2019 at Unknown time  . metoprolol succinate (TOPROL-XL) 50 MG 24 hr tablet Take 50 mg by mouth every morning. Take with or immediately following a meal.    04/23/2019 at 815a  . pantoprazole (PROTONIX) 40 MG tablet Take 1 tablet (40 mg total) by mouth daily before breakfast. 30 tablet 5 04/23/2019 at Unknown time  . polyethylene glycol powder (GLYCOLAX/MIRALAX) 17 GM/SCOOP powder Take 17 g by mouth daily.   Past Week at Unknown time  . potassium chloride (KLOR-CON) 10 MEQ tablet Take 10 mEq by mouth every morning.   04/23/2019 at Unknown time  . simvastatin (ZOCOR) 40 MG tablet Take 40 mg by mouth daily at 8 pm.    04/22/2019 at Unknown time  . torsemide (DEMADEX) 20 MG tablet Take 2 tablets (40 mg total) by mouth 2 (two) times daily. 360 tablet 3 04/23/2019 at Unknown time  . Vitamin D, Ergocalciferol, (DRISDOL) 1.25 MG (50000 UT) CAPS capsule Take 50,000 Units by mouth every Sunday.    04/20/2019 at Unknown time  . warfarin (COUMADIN) 2 MG tablet Take 1 tablet daily or as directed by Coumadin clinic (Patient taking differently: Take 1-2 mg by mouth See  admin instructions. 2mg  on Sundays. Takes 1mg  on all other days. Takes in the morning) 45 tablet 6 04/23/2019 at 815a  . metoprolol succinate (TOPROL XL) 25 MG 24 hr tablet Take 1 tablet (25 mg total) by mouth daily. (Patient not taking: Reported on 04/23/2019) 30 tablet 3 Not Taking at Unknown time   Scheduled:  . feeding supplement (ENSURE ENLIVE)  237 mL Oral BID BM  . fluticasone  1 spray Each Nare QHS  . furosemide  40 mg Intravenous BID  . HYDROcodone-acetaminophen  1 tablet Oral BID  . loratadine  10 mg Oral Daily  . metoprolol succinate  100 mg Oral q morning - 10a  . multivitamin with minerals  1 tablet Oral Daily  . pantoprazole  40 mg Oral QAC breakfast  . polyethylene glycol  17 g Oral Daily  . potassium chloride  40 mEq Oral BID  . simvastatin  40 mg Oral Q2000   . sodium chloride flush  3 mL Intravenous Q12H  . Warfarin - Pharmacist Dosing Inpatient   Does not apply q1800   Infusions:  . sodium chloride      Assessment: Mary [name] presented with sacral wound issue (bleeding) for the last several weeks. She has been on coumadin for AVR/afib. Last dose 2/3.  INR therapeutic at 3.0 yesterday, unable to obtain blood draw today per phlebotomy. INR has been around therapeutic range. Will only give 1mg  today, f/u with AM labs  PTA dose: 2 mg on Sun- 1 mg ROW  Goal of Therapy:  INR 2.5-3.5 Monitor platelets by anticoagulation protocol: Yes   Plan:  warfarin 1mg  today Monitor daily INR and s/s of bleeding.  Isac Sarna, BS Pharm D, [place] Clinical Pharmacist Pager [phone] 04/27/2019 9:42 AM

## 2019-04-27 NOTE — Discharge Summary (Signed)
Physician Discharge Summary  Mary [name] [id number] DOB: 1940/09/12 DOA: 04/23/2019  PCP: [name], MD  Admit date: 04/23/2019 Discharge date: 04/27/2019  Admitted From: Home Disposition: Home  Recommendations for Outpatient Follow-up:  1. Follow up with PCP in 1-2 weeks 2. Please obtain BMP/CBC in one week 3. Follow-up scheduled with cardiology  Home Health: Home health RN, PT, aide Equipment/Devices:  Discharge Condition: Stable CODE STATUS: Full code Diet recommendation: Heart healthy  Brief/Interim Summary: 79 year old female who is admitted to the hospital after suffering a fall.  She is on chronic torsemide and was recently started on metolazone for progressive anasarca/lower extremity edema.  It was felt that she may have developed some intravascular volume depletion and hypovolemia.  She was admitted to the hospital and diuretics were held.  She was seen by cardiology and underwent echocardiogram.   Discharge Diagnoses:  Active Problems:   Essential hypertension   S/P aortic valve replacement   Fall   Pressure injury of skin   Anasarca   Hypoalbuminemia   Persistent atrial fibrillation (HCC)   CKD (chronic kidney disease), stage III   Anemia due to chronic kidney disease  1. Anasarca.  Suspect is related to hypoalbuminemia.  Urinalysis negative for proteinuria.  Started on albumin infusions and intravenous Lasix.  Urine output has not been accurately recorded.  Will request strict I's and O's.  Clinically, her edema appears to be improving.  She has been transitioned back to her home dose of torsemide.  Metolazone will be only used as needed for significant weight gain. 2. Chronic kidney disease stage III.  Creatinine is currently near baseline.  Continue to monitor. 3. Anemia due to chronic kidney disease.  Currently stable.  No signs of bleeding. 4. Persistent atrial fibrillation.  Heart rate is currently stable.  Anticoagulated with Coumadin. 5. Status post  aortic valve replacement.  Continue on anticoagulation. 6. Hypertension.  Blood pressures currently stable.  Continue to monitor.  Discharge Instructions  Discharge Instructions    Diet - low sodium heart healthy   Complete by: As directed    Increase activity slowly   Complete by: As directed      Allergies as of 04/27/2019      Reactions   Tape Itching   Redness, Please use "paper" tape only      Medication List    TAKE these medications   acetaminophen 500 MG tablet Commonly known as: TYLENOL Take 500 mg by mouth 2 (two) times daily as needed for mild pain or moderate pain.   ALPRAZolam 0.5 MG tablet Commonly known as: XANAX Take 0.5 mg by mouth at bedtime as needed for sleep.   bisacodyl 5 MG EC tablet Commonly known as: DULCOLAX Take 5 mg by mouth daily as needed for moderate constipation.   cetirizine 10 MG tablet Commonly known as: ZYRTEC Take 10 mg by mouth at bedtime as needed for allergies.   fluticasone 50 MCG/ACT nasal spray Commonly known as: FLONASE Place 1 spray into both nostrils at bedtime.   HYDROcodone-acetaminophen 10-325 MG tablet Commonly known as: NORCO Take 1 tablet by mouth 2 (two) times daily.   metolazone 10 MG tablet Commonly known as: ZAROXOLYN Take 0.5 tablets (5 mg total) by mouth daily as needed (if weight increases by >2lbs). What changed:   how much to take  when to take this  reasons to take this   metoprolol succinate 50 MG 24 hr tablet Commonly known as: TOPROL-XL Take 2 tablets (100 mg total) by  mouth every morning. Take with or immediately following a meal. What changed:   how much to take  Another medication with the same name was removed. Continue taking this medication, and follow the directions you see here.   pantoprazole 40 MG tablet Commonly known as: PROTONIX Take 1 tablet (40 mg total) by mouth daily before breakfast.   polyethylene glycol powder 17 GM/SCOOP powder Commonly known as:  GLYCOLAX/MIRALAX Take 17 g by mouth daily.   potassium chloride 10 MEQ tablet Commonly known as: KLOR-CON Take 10 mEq by mouth every morning.   simvastatin 40 MG tablet Commonly known as: ZOCOR Take 40 mg by mouth daily at 8 pm.   torsemide 20 MG tablet Commonly known as: DEMADEX Take 2 tablets (40 mg total) by mouth 2 (two) times daily.   Vitamin D (Ergocalciferol) 1.25 MG (50000 UNIT) Caps capsule Commonly known as: DRISDOL Take 50,000 Units by mouth every Sunday.   warfarin 2 MG tablet Commonly known as: COUMADIN Take as directed. If you are unsure how to take this medication, talk to your nurse or doctor. Original instructions: Take 1 tablet daily or as directed by Coumadin clinic What changed:   how much to take  how to take this  when to take this  additional instructions      Follow-up Information    Strader, Brittany M, PA-C Follow up on 05/13/2019.   Specialties: Physician Assistant, Cardiology Why: Cardiology Hospital Follow-up on 05/13/2019 at 2:30 PM. This will be at our [place] OFFICE location.  Contact information: [address] [place] [address] [phone]        [facility] Follow up.   Specialty: Home Health Services Why:  RN/ PT/ Aide       Knowlton, Steve, MD. Schedule an appointment as soon as possible for a visit in 1 week(s).   Specialty: Family Medicine Contact information: [address]   [address] [phone]          Allergies  Allergen Reactions  . Tape Itching    Redness, Please use "paper" tape only    Consultations:  Cardiology   Procedures/Studies: DG Chest Port 1 View  Result Date: 04/23/2019 CLINICAL DATA:  Edema.  Bleeding wound. EXAM: PORTABLE CHEST 1 VIEW COMPARISON:  03/27/2019 FINDINGS: Prior median sternotomy. Stable blunting of the right costophrenic angle compatible with moderate right pleural effusion, with associated passive atelectasis. Mild enlargement  of the cardiopericardial silhouette. Scarring or atelectasis in the right middle lobe and lingula. IMPRESSION: 1. Stable appearance the chest with at least a moderate right pleural effusion and bibasilar atelectasis or scarring. 2. Stable mild cardiomegaly. Electronically Signed   By: Walter  Liebkemann M.D.   On: 04/23/2019 18:59   ECHOCARDIOGRAM COMPLETE  Result Date: 04/24/2019   ECHOCARDIOGRAM REPORT   Patient Name:   Lylianna F Cuaresma Date of Exam: 04/24/2019 Medical Rec #:  [id number]   Height:       70.0 in Accession #:    [id number]  Weight:       174.2 lb Date of Birth:  09/03/1940   BSA:          1.97 m Patient Age:    79 years    BP:           109/52 mmHg Patient Gender: F           HR:           71  bpm. Exam Location:  [facility] Procedure: 2D Echo  Indications:    CHF-Acute Diastolic [id number] / [id number]  History:        Patient has prior history of Echocardiogram examinations, most                 recent 11/03/2015. Stroke; Risk Factors:Non-Smoker, Diabetes and                 Dyslipidemia. FAll, S/P right knee arthroscopy, (s/p St. Jude                 AVR in 1995.  Sonographer:    Leavy Cella RDCS (AE) Referring Phys: [id number] [place]  1. Left ventricular ejection fraction, by visual estimation, is 60 to 65%. The left ventricle has normal function. There is moderately increased left ventricular hypertrophy.  2. Elevated left ventricular end-diastolic pressure.  3. Left ventricular diastolic parameters are indeterminate.  4. Right ventricular volume and pressure overload.  5. The left ventricle has no regional wall motion abnormalities.  6. Global right ventricle has moderately reduced systolic function.The right ventricular size is severely enlarged. No increase in right ventricular wall thickness.  7. Left atrial size was severely dilated.  8. Right atrial size was severely dilated.  9. Moderate mitral annular calcification. 10. The mitral valve is grossly normal. Moderate mitral valve  regurgitation. 11. The tricuspid valve is grossly normal. 12. The tricuspid valve is grossly normal. Tricuspid valve regurgitation is severe. 13. Aortic valve regurgitation is trivial. No evidence of aortic valve sclerosis or stenosis. 14. Pulmonic regurgitation is mild. 15. The pulmonic valve was grossly normal. Pulmonic valve regurgitation is mild. 16. Severely elevated pulmonary artery systolic pressure. 17. The inferior vena cava is dilated in size with <50% respiratory variability, suggesting right atrial pressure of 15 mmHg. 18. There is left bowing of the interatrial septum, suggestive of elevated right atrial pressure. FINDINGS  Left Ventricle: Left ventricular ejection fraction, by visual estimation, is 60 to 65%. The left ventricle has normal function. The left ventricle has no regional wall motion abnormalities. The left ventricular internal cavity size was the left ventricle is normal in size. There is moderately increased left ventricular hypertrophy. Asymmetric left ventricular hypertrophy of the posterior wall. The interventricular septum is flattened in systole and diastole, consistent with right ventricular pressure and volume overload. Left ventricular diastolic parameters are indeterminate. Elevated left ventricular end-diastolic pressure. Right Ventricle: The right ventricular size is severely enlarged. No increase in right ventricular wall thickness. Global RV systolic function is has moderately reduced systolic function. The tricuspid regurgitant velocity is 3.99 m/s, and with an assumed right atrial pressure of 10 mmHg, the estimated right ventricular systolic pressure is severely elevated at 73.6 mmHg. Left Atrium: Left atrial size was severely dilated. Right Atrium: Right atrial size was severely dilated Pericardium: There is no evidence of pericardial effusion. Mitral Valve: The mitral valve is grossly normal. Moderate mitral annular calcification. Moderate mitral valve regurgitation.  Tricuspid Valve: The tricuspid valve is grossly normal. Tricuspid valve regurgitation is severe. Aortic Valve: The aortic valve has been repaired/replaced. Aortic valve regurgitation is trivial. The aortic valve is structurally normal, with no evidence of sclerosis or stenosis. Aortic valve mean gradient measures 5.7 mmHg. Aortic valve peak gradient  measures 8.2 mmHg. St. Jude valve is present in the aortic position. Echo findings show normal structure and function of the aortic prosthesis. Pulmonic Valve: The pulmonic valve was grossly normal. Pulmonic valve regurgitation is mild. Pulmonic regurgitation is mild. Aorta: The aortic root is normal in size  and structure. Venous: The inferior vena cava is dilated in size with less than 50% respiratory variability, suggesting right atrial pressure of 15 mmHg. IAS/Shunts: There is left bowing of the interatrial septum, suggestive of elevated right atrial pressure. No atrial level shunt detected by color flow Doppler.  LEFT VENTRICLE PLAX 2D LVIDd:         3.57 cm Diastology LVIDs:         2.56 cm LV e' lateral:   8.41 cm/s LV PW:         1.46 cm LV E/e' lateral: 15.3 LV IVS:        0.87 cm LV e' medial:    5.96 cm/s LV SV:         30 ml   LV E/e' medial:  21.6 LV SV Index:   14.96  RIGHT VENTRICLE RV S prime:     7.18 cm/s TAPSE (M-mode): 1.1 cm LEFT ATRIUM             Index       RIGHT ATRIUM           Index LA diam:        4.20 cm 2.13 cm/m  RA Area:     30.40 cm LA Vol (A2C):   93.1 ml 47.30 ml/m RA Volume:   114.00 ml 57.92 ml/m LA Vol (A4C):   89.4 ml 45.42 ml/m LA Biplane Vol: 96.3 ml 48.93 ml/m  AORTIC VALVE AV Vmax:           143.33 cm/s AV Vmean:          112.667 cm/s AV VTI:            0.332 m AV Peak Grad:      8.2 mmHg AV Mean Grad:      5.7 mmHg LVOT Vmax:         65.57 cm/s LVOT Vmean:        51.967 cm/s LVOT VTI:          0.143 m LVOT/AV VTI ratio: 0.43  AORTA Ao Root diam: 2.50 cm MITRAL VALVE                         TRICUSPID VALVE MV Area (PHT):  4.68 cm              TR Peak grad:   63.6 mmHg MV PHT:        46.98 msec            TR Vmax:        423.00 cm/s MV Decel Time: 162 msec MV E velocity: 129.00 cm/s 103 cm/s  SHUNTS MV A velocity: 37.80 cm/s  70.3 cm/s Systemic VTI: 0.14 m MV E/A ratio:  3.41        1.5  Kate Sable MD Electronically signed by Kate Sable MD Signature Date/Time: 04/24/2019/10:57:02 AM    Final        Subjective: No shortness of breath.  Feels swelling is improved, wants to go home  Discharge Exam: Vitals:   04/26/19 1338 04/26/19 2054 04/27/19 0500 04/27/19 0512  BP: 112/62 (!) 113/59  (!) 113/52  Pulse: 83 72  70  Resp: 17 17  17   Temp: 97.9 F (36.6 C) 98.4 F (36.9 C)  98 F (36.7 C)  TempSrc:      SpO2: 92% 98%  93%  Weight:   80 kg   Height:  General: Pt is alert, awake, not in acute distress Cardiovascular: RRR, S1/S2 +, no rubs, no gallops Respiratory: CTA bilaterally, no wheezing, no rhonchi Abdominal: Soft, NT, ND, bowel sounds + Extremities: 1+ edema, no cyanosis    The results of significant diagnostics from this hospitalization (including imaging, microbiology, ancillary and laboratory) are listed below for reference.     Microbiology: Recent Results (from the past 240 hour(s))  SARS CORONAVIRUS 2 (TAT 6-24 HRS) Nasopharyngeal Nasopharyngeal Swab     Status: None   Collection Time: 04/23/19  6:05 PM   Specimen: Nasopharyngeal Swab  Result Value Ref Range Status   SARS Coronavirus 2 NEGATIVE NEGATIVE Final    Comment: (NOTE) SARS-CoV-2 target nucleic acids are NOT DETECTED. The SARS-CoV-2 RNA is generally detectable in upper and lower respiratory specimens during the acute phase of infection. Negative results do not preclude SARS-CoV-2 infection, do not rule out co-infections with other pathogens, and should not be used as the sole basis for treatment or other patient management decisions. Negative results must be combined with clinical  observations, patient history, and epidemiological information. The expected result is Negative. Fact Sheet for Patients: SugarRoll.be Fact Sheet for Healthcare Providers: https://www.woods-mathews.com/ This test is not yet approved or cleared by the [place] FDA and  has been authorized for detection and/or diagnosis of SARS-CoV-2 by FDA under an Emergency Use Authorization (EUA). This EUA will remain  in effect (meaning this test can be used) for the duration of the COVID-19 declaration under Section 56 4(b)(1) of the Act, 21 U.S.C. section 360bbb-3(b)(1), unless the authorization is terminated or revoked sooner. Performed at [facility], [place] [address], [place], [place] [address]      Labs: BNP (last 3 results) Recent Labs    04/23/19 1605  BNP [id number]*   Basic Metabolic Panel: Recent Labs  Lab 04/23/19 1602 04/24/19 0408 04/25/19 0549 04/26/19 0607 04/27/19 0554  NA 133* 137 136 137 138  K 3.3* 3.5 3.5 3.3* 4.4  CL 95* 97* 99 99 100  CO2 28 28 28 28 26   GLUCOSE 90 87 94 82 73  BUN 21 21 19 19 20   CREATININE 1.58* 1.41* 1.48* 1.36* 1.43*  CALCIUM 8.1* 8.5* 8.4* 8.8* 8.9   Liver Function Tests: Recent Labs  Lab 04/23/19 1602 04/24/19 0408  AST 25 21  ALT 8 7  ALKPHOS 89 82  BILITOT 1.9* 1.9*  PROT 6.9 6.4*  ALBUMIN 2.4* 2.3*   No results for input(s): LIPASE, AMYLASE in the last 168 hours. No results for input(s): AMMONIA in the last 168 hours. CBC: Recent Labs  Lab 04/23/19 1602 04/24/19 0408  WBC 4.3 4.5  NEUTROABS 2.4  --   HGB 9.7* 9.2*  HCT 31.5* 29.3*  MCV 115.0* 112.7*  PLT 122* 125*   Cardiac Enzymes: No results for input(s): CKTOTAL, CKMB, CKMBINDEX, TROPONINI in the last 168 hours. BNP: Invalid input(s): POCBNP CBG: Recent Labs  Lab 04/25/19 0739 04/25/19 1153 04/25/19 1700 04/27/19 0755 04/27/19 1056  GLUCAP 84 85 81 76 99   D-Dimer No results for input(s): DDIMER in the  last 72 hours. Hgb A1c No results for input(s): HGBA1C in the last 72 hours. Lipid Profile No results for input(s): CHOL, HDL, LDLCALC, TRIG, CHOLHDL, LDLDIRECT in the last 72 hours. Thyroid function studies No results for input(s): TSH, T4TOTAL, T3FREE, THYROIDAB in the last 72 hours.  Invalid input(s): FREET3 Anemia work up No results for input(s): VITAMINB12, FOLATE, FERRITIN, TIBC, IRON, RETICCTPCT in the last  72 hours. Urinalysis    Component Value Date/Time   COLORURINE STRAW (A) 04/26/2019 2003   APPEARANCEUR CLEAR 04/26/2019 2003   LABSPEC 1.005 04/26/2019 2003   PHURINE 6.0 04/26/2019 2003   GLUCOSEU NEGATIVE 04/26/2019 2003   [place] NEGATIVE 04/26/2019 2003   [place] NEGATIVE 04/26/2019 2003   [place] NEGATIVE 04/26/2019 2003   PROTEINUR NEGATIVE 04/26/2019 2003   UROBILINOGEN 2.0 (H) 11/01/2007 1518   NITRITE NEGATIVE 04/26/2019 2003   LEUKOCYTESUR NEGATIVE 04/26/2019 2003   Sepsis Labs Invalid input(s): PROCALCITONIN,  WBC,  LACTICIDVEN Microbiology Recent Results (from the past 240 hour(s))  SARS CORONAVIRUS 2 (TAT 6-24 HRS) Nasopharyngeal Nasopharyngeal Swab     Status: None   Collection Time: 04/23/19  6:05 PM   Specimen: Nasopharyngeal Swab  Result Value Ref Range Status   SARS Coronavirus 2 NEGATIVE NEGATIVE Final    Comment: (NOTE) SARS-CoV-2 target nucleic acids are NOT DETECTED. The SARS-CoV-2 RNA is generally detectable in upper and lower respiratory specimens during the acute phase of infection. Negative results do not preclude SARS-CoV-2 infection, do not rule out co-infections with other pathogens, and should not be used as the sole basis for treatment or other patient management decisions. Negative results must be combined with clinical observations, patient history, and epidemiological information. The expected result is Negative. Fact Sheet for Patients: SugarRoll.be Fact Sheet for Healthcare  Providers: https://www.woods-mathews.com/ This test is not yet approved or cleared by the [place] FDA and  has been authorized for detection and/or diagnosis of SARS-CoV-2 by FDA under an Emergency Use Authorization (EUA). This EUA will remain  in effect (meaning this test can be used) for the duration of the COVID-19 declaration under Section 56 4(b)(1) of the Act, 21 U.S.C. section 360bbb-3(b)(1), unless the authorization is terminated or revoked sooner. Performed at [facility], [place] [address] [place], [place] [address]      Time coordinating discharge: 83mins  SIGNED:   Kathie Dike, MD  [facility] 04/27/2019, 6:29 PM   If 7PM-7AM, please contact night-coverage [email or web address]

## 2019-04-30 ENCOUNTER — Ambulatory Visit: Payer: Medicare Other | Attending: Internal Medicine

## 2019-04-30 DIAGNOSIS — Z23 Encounter for immunization: Secondary | ICD-10-CM

## 2019-04-30 NOTE — Progress Notes (Signed)
   Covid-19 Vaccination Clinic  Name:  Mary [name]    MRN: [id number] DOB: 1940-10-19  04/30/2019  Ms. Mary [name] was observed post Covid-19 immunization for 15 minutes without incidence. She was provided with Vaccine Information Sheet and instruction to access the V-Safe system.   Ms. Mary [name] was instructed to call 911 with any severe reactions post vaccine: Marland Kitchen Difficulty breathing  . Swelling of your face and throat  . A fast heartbeat  . A bad rash all over your body  . Dizziness and weakness    Immunizations Administered    Name Date Dose VIS Date Route   Pfizer COVID-19 Vaccine 04/30/2019  9:06 AM 0.3 mL 02/28/2019 Intramuscular   Manufacturer: [employer], [employer]   Lot: [id number]   [place]: [id number]

## 2019-05-07 ENCOUNTER — Ambulatory Visit (INDEPENDENT_AMBULATORY_CARE_PROVIDER_SITE_OTHER): Payer: Medicare Other | Admitting: *Deleted

## 2019-05-07 DIAGNOSIS — Z5181 Encounter for therapeutic drug level monitoring: Secondary | ICD-10-CM

## 2019-05-07 DIAGNOSIS — Z952 Presence of prosthetic heart valve: Secondary | ICD-10-CM

## 2019-05-07 DIAGNOSIS — I4891 Unspecified atrial fibrillation: Secondary | ICD-10-CM | POA: Diagnosis not present

## 2019-05-07 LAB — POCT INR: INR: 2 (ref 2.0–3.0)

## 2019-05-07 NOTE — Patient Instructions (Signed)
Take warfarin 1 tablet tonight then resume 1/2 tablet daily except 1 tablet on Sundays Continue 1 to 2 servings of greens a week Recheck in 2 weeks

## 2019-05-13 ENCOUNTER — Ambulatory Visit: Payer: Medicare Other | Admitting: Student

## 2019-05-13 ENCOUNTER — Telehealth: Payer: Self-pay

## 2019-05-13 ENCOUNTER — Other Ambulatory Visit: Payer: Self-pay

## 2019-05-13 ENCOUNTER — Encounter: Payer: Self-pay | Admitting: Student

## 2019-05-13 ENCOUNTER — Other Ambulatory Visit (HOSPITAL_COMMUNITY)
Admission: RE | Admit: 2019-05-13 | Discharge: 2019-05-13 | Disposition: A | Discharge status: Home / Self Care | Payer: Medicare Other | Source: Ambulatory Visit | Attending: Student | Admitting: Student

## 2019-05-13 VITALS — BP 102/55 | HR 76 | Temp 98.4°F | Ht 67.0 in | Wt 169.0 lb

## 2019-05-13 DIAGNOSIS — I5032 Chronic diastolic (congestive) heart failure: Secondary | ICD-10-CM

## 2019-05-13 DIAGNOSIS — E782 Mixed hyperlipidemia: Secondary | ICD-10-CM

## 2019-05-13 DIAGNOSIS — Z952 Presence of prosthetic heart valve: Secondary | ICD-10-CM

## 2019-05-13 DIAGNOSIS — G4733 Obstructive sleep apnea (adult) (pediatric): Secondary | ICD-10-CM

## 2019-05-13 DIAGNOSIS — Z79899 Other long term (current) drug therapy: Secondary | ICD-10-CM | POA: Insufficient documentation

## 2019-05-13 DIAGNOSIS — I4821 Permanent atrial fibrillation: Secondary | ICD-10-CM

## 2019-05-13 LAB — BASIC METABOLIC PANEL
Anion gap: 8 (ref 5–15)
BUN: 26 mg/dL — ABNORMAL HIGH (ref 8–23)
CO2: 28 mmol/L (ref 22–32)
Calcium: 8.4 mg/dL — ABNORMAL LOW (ref 8.9–10.3)
Chloride: 99 mmol/L (ref 98–111)
Creatinine, Ser: 1.42 mg/dL — ABNORMAL HIGH (ref 0.44–1.00)
GFR calc Af Amer: 40 mL/min — ABNORMAL LOW (ref >60)
GFR calc non Af Amer: 35 mL/min — ABNORMAL LOW (ref >60)
Glucose, Bld: 109 mg/dL — ABNORMAL HIGH (ref 70–99)
Potassium: 3.6 mmol/L (ref 3.5–5.1)
Sodium: 135 mmol/L (ref 135–145)

## 2019-05-13 LAB — CBC
HCT: 30.8 % — ABNORMAL LOW (ref 36.0–46.0)
Hemoglobin: 9.6 g/dL — ABNORMAL LOW (ref 12.0–15.0)
MCH: 36.6 pg — ABNORMAL HIGH (ref 26.0–34.0)
MCHC: 31.2 g/dL (ref 30.0–36.0)
MCV: 117.6 fL — ABNORMAL HIGH (ref 80.0–100.0)
Platelets: 140 10*3/uL — ABNORMAL LOW (ref 150–400)
RBC: 2.62 MIL/uL — ABNORMAL LOW (ref 3.87–5.11)
RDW: 16.9 % — ABNORMAL HIGH (ref 11.5–15.5)
WBC: 5.1 10*3/uL (ref 4.0–10.5)
nRBC: 0 % (ref 0.0–0.2)

## 2019-05-13 NOTE — Telephone Encounter (Signed)
Rec'd call from Otila Kluver with [facility] [phone] Pt states she was a hard stick and would prefer to come to [place]

## 2019-05-13 NOTE — Progress Notes (Signed)
Cardiology Office Note    Date:  05/13/2019   ID:  Machala, Lockwood December 22, 1940, MRN [id number]  PCP:  [name], MD  Cardiologist: Carlyle Dolly, MD    Chief Complaint  Patient presents with  . Hospitalization Follow-up    History of Present Illness:    Mary [name] is a 79 y.o. female with past medical history of persistent atrial fibrillation, chronic diastolic CHF, history of recurrent pleural effusions, aortic valve replacement in 1995, HTN, HLD, OSA (intolerant to CPAP), orthostatic hypotension and history of CVA who presents to the office today for hospital follow-up.   She was most recently admitted to [facility] on 04/23/2019 for evaluation of a non-healing sacral wound and reported worsening weakness. Torsemide had been titrated to 40mg  BID in 03/2019 and she reported her PCP had started her on Metolazone 10mg  daily the week prior. Creatinine was elevated to 1.58 on admission which led to both Torsemide and Metolazone being held. BNP was elevated to 584 and CXR showed a moderate right pleural effusion which was similar to prior tracings. Follow-up echo showed a preserved EF of 60-65% with moderately increased LVH. RV function was moderately reduced. She had severe biatrial dilation with moderate MR and severe TR. AI was trivial with no evidence of stenosis. She was transitioned to Torsemide 40mg  BID at discharge and Metolazone was changed to 5mg  as needed for weight gain. Weight at discharge was 176 lbs and creatinine had improved to 1.43.   In talking with the patient and her husband today, they report her weight has continued to decline on the the home scales and is in the low-160's. At 169 lbs on the office scales today. She has only had to take Metolazone once. Her edema has been stable and she denies any orthopnea or PND. No recent chest pain, dizziness or presyncope. She does experience occasional palpitations but no persistent symptoms.   She reports her appetite has  declined due to changes in taste. She is trying to drink Boost and Ensure. They do monitor her sodium intake. She consumes 3-4 bottles of water on a daily basis.   Past Medical History:  Diagnosis Date  . Anxiety   . Diabetes mellitus with neuropathy ([place])   . Diabetic neuropathy ([place])   . DJD (degenerative joint disease)   . H/O aortic valve replacement   . Hyperlipidemia   . Hypertension   . Neuromuscular disorder (HCC)    neuropathy in feet  . Obesity   . Sleep apnea   . Stroke [facility] [address])    " light stroke"  . Wears glasses     Past Surgical History:  Procedure Laterality Date  . ANKLE SURGERY     Left tendon repair  . AORTIC VALVE REPLACEMENT  03/1994  . CARDIAC CATHETERIZATION     1996 [facility]  . CERVICAL SPINE SURGERY    . COLONOSCOPY    . HEMORRHOID SURGERY    . KNEE ARTHROSCOPY Right 04/13/2015   Procedure: RIGHT KNEE ARTHROSCOPY WITH DEBRIDEMENT AND PARTIAL MEDIAL MENISCECTOMY;  Surgeon: Mcarthur Rossetti, MD;  Location: [place];  Service: Orthopedics;  Laterality: Right;  . KNEE ARTHROSCOPY W/ MENISCECTOMY Right 04/13/2015  . LUMBAR FUSION      Current Medications: Outpatient Medications Prior to Visit  Medication Sig Dispense Refill  . acetaminophen (TYLENOL) 500 MG tablet Take 500 mg by mouth 2 (two) times daily as needed for mild pain or moderate pain.     Marland Kitchen ALPRAZolam Duanne Moron)  0.5 MG tablet Take 0.5 mg by mouth at bedtime as needed for sleep.     . cetirizine (ZYRTEC) 10 MG tablet Take 10 mg by mouth at bedtime as needed for allergies.   11  . fluticasone (FLONASE) 50 MCG/ACT nasal spray Place 1 spray into both nostrils at bedtime.   11  . HYDROcodone-acetaminophen (NORCO) 10-325 MG tablet Take 1 tablet by mouth 2 (two) times daily.   0  . metolazone (ZAROXOLYN) 10 MG tablet Take 0.5 tablets (5 mg total) by mouth daily as needed (if weight increases by >2lbs).    . metoprolol succinate (TOPROL-XL) 50 MG 24 hr tablet Take 2 tablets (100 mg total) by mouth  every morning. Take with or immediately following a meal. 60 tablet 0  . pantoprazole (PROTONIX) 40 MG tablet Take 1 tablet (40 mg total) by mouth daily before breakfast. 30 tablet 5  . polyethylene glycol powder (GLYCOLAX/MIRALAX) 17 GM/SCOOP powder Take 17 g by mouth daily.    . potassium chloride (KLOR-CON) 10 MEQ tablet Take 10 mEq by mouth every morning.    . simvastatin (ZOCOR) 40 MG tablet Take 40 mg by mouth daily at 8 pm.     . torsemide (DEMADEX) 20 MG tablet Take 2 tablets (40 mg total) by mouth 2 (two) times daily. 360 tablet 3  . Vitamin D, Ergocalciferol, (DRISDOL) 1.25 MG (50000 UT) CAPS capsule Take 50,000 Units by mouth every Sunday.     . warfarin (COUMADIN) 2 MG tablet Take 1 tablet daily or as directed by Coumadin clinic (Patient taking differently: Take 1-2 mg by mouth See admin instructions. 2mg  on Sundays. Takes 1mg  on all other days. Takes in the morning) 45 tablet 6  . bisacodyl (DULCOLAX) 5 MG EC tablet Take 5 mg by mouth daily as needed for moderate constipation.     No facility-administered medications prior to visit.     Allergies:   Tape   Social History   Socioeconomic History  . Marital status: Married    Spouse name: Not on file  . Number of children: Not on file  . Years of education: Not on file  . Highest education level: Not on file  Occupational History  . Occupation: Disabled    Employer: RETIRED  Tobacco Use  . Smoking status: Never Smoker  . Smokeless tobacco: Never Used  Substance and Sexual Activity  . Alcohol use: No    Alcohol/week: 0.0 standard drinks  . Drug use: No  . Sexual activity: Not on file  Other Topics Concern  . Not on file  Social History Narrative   Widowed   No regular exercise   2 children   Social Determinants of Health   Financial Resource Strain:   . Difficulty of Paying Living Expenses: Not on file  Food Insecurity:   . Worried About Charity fundraiser in the Last Year: Not on file  . Ran Out of Food in  the Last Year: Not on file  Transportation Needs:   . Lack of Transportation (Medical): Not on file  . Lack of Transportation (Non-Medical): Not on file  Physical Activity:   . Days of Exercise per Week: Not on file  . Minutes of Exercise per Session: Not on file  Stress:   . Feeling of Stress : Not on file  Social Connections:   . Frequency of Communication with Friends and Family: Not on file  . Frequency of Social Gatherings with Friends and Family: Not on file  .  Attends Religious Services: Not on file  . Active Member of Clubs or Organizations: Not on file  . Attends Archivist Meetings: Not on file  . Marital Status: Not on file     Family History:  The patient's family history includes Dementia in her mother; Diabetes in her sister; Pneumonia in her sister.   Review of Systems:   Please see the history of present illness.     General:  No chills, fever, or night sweats. Positive for weight loss.  Cardiovascular:  No chest pain, dyspnea on exertion, orthopnea, palpitations, paroxysmal nocturnal dyspnea. Positive for edema.  Dermatological: No rash, lesions/masses Respiratory: No cough, dyspnea Urologic: No hematuria, dysuria Abdominal:   No nausea, vomiting, diarrhea, bright red blood per rectum, melena, or hematemesis Neurologic:  No visual changes, wkns, changes in mental status. All other systems reviewed and are otherwise negative except as noted above.   Physical Exam:    VS:  BP (!) 102/55   Pulse 76   Temp 98.4 F (36.9 C)   Ht 5\' 7"  (1.702 m)   Wt 169 lb (76.7 kg)   SpO2 99%   BMI 26.47 kg/m    General: Well developed, well nourished,female appearing in no acute distress. Head: Normocephalic, atraumatic, sclera non-icteric.  Neck: No carotid bruits. JVD not elevated.  Lungs: Respirations regular and unlabored, without wheezes or rales.  Heart: Irregularly irregular. No S3 or S4.  No murmur, no rubs, or gallops appreciated. Abdomen: Soft,  non-tender, non-distended. No obvious abdominal masses. Msk:  Strength and tone appear normal for age. No obvious joint deformities or effusions. Extremities: No clubbing or cyanosis. Trace lower extremity edema.  Distal pedal pulses are 2+ bilaterally. Neuro: Alert and oriented X 3. Moves all extremities spontaneously. No focal deficits noted. Psych:  Responds to questions appropriately with a normal affect. Skin: No rashes or lesions noted  Wt Readings from Last 3 Encounters:  05/13/19 169 lb (76.7 kg)  04/27/19 176 lb 5.9 oz (80 kg)  03/27/19 175 lb (79.4 kg)     Studies/Labs Reviewed:   EKG:  EKG is not ordered today.    Recent Labs: 04/23/2019: B Natriuretic Peptide 584.0 04/24/2019: ALT 7 05/13/2019: BUN 26; Creatinine, Ser 1.42; Hemoglobin 9.6; Platelets 140; Potassium 3.6; Sodium 135   Lipid Panel No results found for: CHOL, TRIG, HDL, CHOLHDL, VLDL, LDLCALC, LDLDIRECT  Additional studies/ records that were reviewed today include:   Event Monitor: 10/2018  7 day event monitor  Min HR 55, Max HR 119, Avg HR 71  No symptoms reported  Tracings show she is in afib throughout the study, rate controlled   Echocardiogram: 04/2019 IMPRESSIONS    1. Left ventricular ejection fraction, by visual estimation, is 60 to  65%. The left ventricle has normal function. There is moderately increased  left ventricular hypertrophy.  2. Elevated left ventricular end-diastolic pressure.  3. Left ventricular diastolic parameters are indeterminate.  4. Right ventricular volume and pressure overload.  5. The left ventricle has no regional wall motion abnormalities.  6. Global right ventricle has moderately reduced systolic function.The  right ventricular size is severely enlarged. No increase in right  ventricular wall thickness.  7. Left atrial size was severely dilated.  8. Right atrial size was severely dilated.  9. Moderate mitral annular calcification.  10. The mitral  valve is grossly normal. Moderate mitral valve  regurgitation.  11. The tricuspid valve is grossly normal.  12. The tricuspid valve is grossly normal. Tricuspid valve  regurgitation  is severe.  13. Aortic valve regurgitation is trivial. No evidence of aortic valve  sclerosis or stenosis.  14. Pulmonic regurgitation is mild.  15. The pulmonic valve was grossly normal. Pulmonic valve regurgitation is  mild.  16. Severely elevated pulmonary artery systolic pressure.  17. The inferior vena cava is dilated in size with <50% respiratory  variability, suggesting right atrial pressure of 15 mmHg.  18. There is left bowing of the interatrial septum, suggestive of elevated  right atrial pressure.  Assessment:    1. Chronic diastolic heart failure ([place])   2. Medication management   3. Permanent atrial fibrillation ([place])   4. S/P aortic valve replacement   5. Mixed hyperlipidemia   6. OSA (obstructive sleep apnea)      Plan:   In order of problems listed above:  1. Chronic Diastolic CHF - she was recently admitted for worsening weakness in the setting of dehydration. Repeat echo showed a preserved EF of 60-65% with moderately increased LVH. RV function was moderately reduced along with with moderate MR and severe TR. Suspect her untreated OSA is contributing to her RV dysfunction.  - I encouraged them to continue to follow daily weights as while her weight has declined since discharge, her appetite has declined as well. Will continue Torsemide 40mg  BID for now along with Metolazone 5mg  to take as needed for weight gain > 2 lbs overnight. Recheck BMET today.   2. Persistent Atrial Fibrillation - HR is well-controlled in the 70's during today's visit. Continue current regimen with Toprol-XL 100mg  daily. - she denies any evidence of active bleeding. She does have a chronic anemia with Hgb of 9.2 at the time of hospital discharge. Recheck CBC. Remains on Coumadin for anticoagulation.   3.  Aortic Valve Replacement - s/p AVR in 1995. Recent echo showed trivial AI with no evidence of stenosis. She remains on Coumadin for anticoagulation.   4. HLD - followed by PCP. On Simvastatin 40mg  daily.   5. OSA - she has known OSA but has been intolerant to CPAP.    Medication Adjustments/Labs and Tests Ordered: Current medicines are reviewed at length with the patient today.  Concerns regarding medicines are outlined above.  Medication changes, Labs and Tests ordered today are listed in the Patient Instructions below. Patient Instructions  Medication Instructions:  Your physician recommends that you continue on your current medications as directed. Please refer to the Current Medication list given to you today.  *If you need a refill on your cardiac medications before your next appointment, please call your pharmacy*  Lab Work: CBC,bmet today  If you have labs (blood work) drawn today and your tests are completely normal, you will receive your results only by: Marland Kitchen MyChart Message (if you have MyChart) OR . A paper copy in the mail If you have any lab test that is abnormal or we need to change your treatment, we will call you to review the results.  Testing/Procedures: None today  Follow-Up: At [facility], you and your health needs are our priority.  As part of our continuing mission to provide you with exceptional heart care, we have created designated Provider Care Teams.  These Care Teams include your primary Cardiologist (physician) and Advanced Practice Providers (APPs -  Physician Assistants and Nurse Practitioners) who all work together to provide you with the care you need, when you need it.  Your next appointment:   3 month(s)  The format for your next appointment:  In Person  Provider:   Carlyle Dolly, MD  Other Instructions None     Thank you for choosing [facility] !         Signed, Erma Heritage, PA-C  05/13/2019  7:29 PM    [address] [address] [place], [place] [address] Phone: [phone] Fax: [phone]

## 2019-05-13 NOTE — Patient Instructions (Signed)
Medication Instructions:  Your physician recommends that you continue on your current medications as directed. Please refer to the Current Medication list given to you today.  *If you need a refill on your cardiac medications before your next appointment, please call your pharmacy*  Lab Work: CBC,bmet today  If you have labs (blood work) drawn today and your tests are completely normal, you will receive your results only by: Marland Kitchen MyChart Message (if you have MyChart) OR . A paper copy in the mail If you have any lab test that is abnormal or we need to change your treatment, we will call you to review the results.  Testing/Procedures: None today  Follow-Up: At [facility], you and your health needs are our priority.  As part of our continuing mission to provide you with exceptional heart care, we have created designated Provider Care Teams.  These Care Teams include your primary Cardiologist (physician) and Advanced Practice Providers (APPs -  Physician Assistants and Nurse Practitioners) who all work together to provide you with the care you need, when you need it.  Your next appointment:   3 month(s)  The format for your next appointment:   In Person  Provider:   Carlyle Dolly, MD  Other Instructions None     Thank you for choosing [facility] !

## 2019-05-13 NOTE — Telephone Encounter (Signed)
Patient being seen in office today.She can go to the lab after visit.

## 2019-05-15 ENCOUNTER — Telehealth: Payer: Self-pay

## 2019-05-15 NOTE — Telephone Encounter (Signed)
Patient given lab results, pcp copied

## 2019-05-15 NOTE — Telephone Encounter (Signed)
Pt returning call.  Please call again [phone]  Thanks renee

## 2019-05-21 ENCOUNTER — Ambulatory Visit (INDEPENDENT_AMBULATORY_CARE_PROVIDER_SITE_OTHER): Payer: Medicare Other | Admitting: *Deleted

## 2019-05-21 ENCOUNTER — Other Ambulatory Visit: Payer: Self-pay

## 2019-05-21 DIAGNOSIS — Z952 Presence of prosthetic heart valve: Secondary | ICD-10-CM

## 2019-05-21 DIAGNOSIS — I4891 Unspecified atrial fibrillation: Secondary | ICD-10-CM | POA: Diagnosis not present

## 2019-05-21 DIAGNOSIS — Z5181 Encounter for therapeutic drug level monitoring: Secondary | ICD-10-CM

## 2019-05-21 LAB — POCT INR: INR: 1.6 — AB (ref 2.0–3.0)

## 2019-05-21 NOTE — Patient Instructions (Signed)
Take warfarin 1 tablet tonight then increase dose to resume 1/2 tablet daily except 1 tablet on Sundays and Thursdays Continue 1 to 2 servings of greens a week Recheck in 2 weeks

## 2019-06-05 ENCOUNTER — Other Ambulatory Visit: Payer: Self-pay

## 2019-06-05 ENCOUNTER — Ambulatory Visit (INDEPENDENT_AMBULATORY_CARE_PROVIDER_SITE_OTHER): Payer: Medicare Other | Admitting: *Deleted

## 2019-06-05 ENCOUNTER — Ambulatory Visit: Payer: Medicare Other | Admitting: Critical Care Medicine

## 2019-06-05 DIAGNOSIS — Z952 Presence of prosthetic heart valve: Secondary | ICD-10-CM

## 2019-06-05 DIAGNOSIS — I4891 Unspecified atrial fibrillation: Secondary | ICD-10-CM | POA: Diagnosis not present

## 2019-06-05 DIAGNOSIS — Z5181 Encounter for therapeutic drug level monitoring: Secondary | ICD-10-CM

## 2019-06-05 LAB — POCT INR: INR: 2.1 (ref 2.0–3.0)

## 2019-06-05 NOTE — Progress Notes (Deleted)
Synopsis: Referred in August 2020 for recurrent pleural effusion by [name], MD  Subjective:   PATIENT ID: Mary [name] GENDER: female DOB: 09-05-1940, MRN: [id number]  No chief complaint on file.   HPI   Weakly exudative effusion on R- likely CHF, needs CXR Stable on 04/23/2019 CXR  Recent hospital admission 2/3-2/7-anasarca due to hypoalbuminemia, diuresed CKD 3 Lower extremity ultrasound negative for DVT  chronic Coumadin for aortic valve replacement and A. fib    OV 11/07/2018: Mary [name] is an 79 year old woman her history of HFpEF, A. Fib, and SAVR on chronic Coumadin referred for evaluation of a recurrent pleural effusion.  She underwent a right-sided thoracentesis in 01/2017 with 1.2 L of exudative fluid by Light's criteria removed and was being followed by her cardiologist.  She had undergone thoracentesis prior, but the fluid was lost so studies were unable to be performed on it. Following her thoracentesis she had acute reaccumulation of the fluid, and she had not responded to increasing doses of diuretics. All cultures and cytology from the fluid were negative.  Following this she established care with Dr. Luan Pulling, her pulmonologist in [place].  He did not think that she required additional thoracenteses.  He is retiring, and she is transferring her care to [facility]. She has no history of cancer, liver disease, kidney disease, or rheumatologic disease.  He is a never smoker.  Currently she has dyspnea on exertion and reports low exercise tolerance.  She does not think she could walk down a block of the street.  She does not exercise on a regular basis.  She has paroxysmal nocturnal dyspnea, and sleeps with the bed that inclines the head of the bed.  She has worsening leg edema since her diuretic regimen was reduced easily.  Overall her symptoms are chronic and stable.   She has a history of OSA but does not use her's prescribed CPAP due to perceived breathlessness.    Past Medical History:  Diagnosis Date  . Anxiety   . Diabetes mellitus with neuropathy ([place])   . Diabetic neuropathy ([place])   . DJD (degenerative joint disease)   . H/O aortic valve replacement   . Hyperlipidemia   . Hypertension   . Neuromuscular disorder (HCC)    neuropathy in feet  . Obesity   . Sleep apnea   . Stroke [facility])    " light stroke"  . Wears glasses      Family History  Problem Relation Age of Onset  . Pneumonia Sister   . Dementia Mother   . Diabetes Sister      Past Surgical History:  Procedure Laterality Date  . ANKLE SURGERY     Left tendon repair  . AORTIC VALVE REPLACEMENT  03/1994  . CARDIAC CATHETERIZATION     1996 [facility]  . CERVICAL SPINE SURGERY    . COLONOSCOPY    . HEMORRHOID SURGERY    . KNEE ARTHROSCOPY Right 04/13/2015   Procedure: RIGHT KNEE ARTHROSCOPY WITH DEBRIDEMENT AND PARTIAL MEDIAL MENISCECTOMY;  Surgeon: Mcarthur Rossetti, MD;  Location: [facility];  Service: Orthopedics;  Laterality: Right;  . KNEE ARTHROSCOPY W/ MENISCECTOMY Right 04/13/2015  . LUMBAR FUSION      Social History   Socioeconomic History  . Marital status: Married    Spouse name: Not on file  . Number of children: Not on file  . Years of education: Not on file  . Highest education level: Not on file  Occupational History  . Occupation:  Disabled    Employer: RETIRED  Tobacco Use  . Smoking status: Never Smoker  . Smokeless tobacco: Never Used  Substance and Sexual Activity  . Alcohol use: No    Alcohol/week: 0.0 standard drinks  . Drug use: No  . Sexual activity: Not on file  Other Topics Concern  . Not on file  Social History Narrative   Widowed   No regular exercise   2 children   Social Determinants of Health   Financial Resource Strain:   . Difficulty of Paying Living Expenses:   Food Insecurity:   . Worried About Charity fundraiser in the Last Year:   . Arboriculturist in the Last Year:   Transportation Needs:   . Lexicographer (Medical):   Marland Kitchen Lack of Transportation (Non-Medical):   Physical Activity:   . Days of Exercise per Week:   . Minutes of Exercise per Session:   Stress:   . Feeling of Stress :   Social Connections:   . Frequency of Communication with Friends and Family:   . Frequency of Social Gatherings with Friends and Family:   . Attends Religious Services:   . Active Member of Clubs or Organizations:   . Attends Archivist Meetings:   Marland Kitchen Marital Status:   Intimate Partner Violence:   . Fear of Current or Ex-Partner:   . Emotionally Abused:   Marland Kitchen Physically Abused:   . Sexually Abused:      Allergies  Allergen Reactions  . Tape Itching    Redness, Please use "paper" tape only     Immunization History  Administered Date(s) Administered  . Influenza-Unspecified 01/11/2015  . PFIZER SARS-COV-2 Vaccination 04/09/2019, 04/30/2019    Outpatient Medications Prior to Visit  Medication Sig Dispense Refill  . acetaminophen (TYLENOL) 500 MG tablet Take 500 mg by mouth 2 (two) times daily as needed for mild pain or moderate pain.     Marland Kitchen ALPRAZolam (XANAX) 0.5 MG tablet Take 0.5 mg by mouth at bedtime as needed for sleep.     . bisacodyl (DULCOLAX) 5 MG EC tablet Take 5 mg by mouth daily as needed for moderate constipation.    . cetirizine (ZYRTEC) 10 MG tablet Take 10 mg by mouth at bedtime as needed for allergies.   11  . fluticasone (FLONASE) 50 MCG/ACT nasal spray Place 1 spray into both nostrils at bedtime.   11  . HYDROcodone-acetaminophen (NORCO) 10-325 MG tablet Take 1 tablet by mouth 2 (two) times daily.   0  . metolazone (ZAROXOLYN) 10 MG tablet Take 0.5 tablets (5 mg total) by mouth daily as needed (if weight increases by >2lbs).    . metoprolol succinate (TOPROL-XL) 50 MG 24 hr tablet Take 2 tablets (100 mg total) by mouth every morning. Take with or immediately following a meal. 60 tablet 0  . pantoprazole (PROTONIX) 40 MG tablet Take 1 tablet (40 mg total) by mouth  daily before breakfast. 30 tablet 5  . polyethylene glycol powder (GLYCOLAX/MIRALAX) 17 GM/SCOOP powder Take 17 g by mouth daily.    . potassium chloride (KLOR-CON) 10 MEQ tablet Take 10 mEq by mouth every morning.    . simvastatin (ZOCOR) 40 MG tablet Take 40 mg by mouth daily at 8 pm.     . torsemide (DEMADEX) 20 MG tablet Take 2 tablets (40 mg total) by mouth 2 (two) times daily. 360 tablet 3  . Vitamin D, Ergocalciferol, (DRISDOL) 1.25 MG (50000 UT) CAPS capsule  Take 50,000 Units by mouth every Sunday.     . warfarin (COUMADIN) 2 MG tablet Take 1 tablet daily or as directed by Coumadin clinic (Patient taking differently: Take 1-2 mg by mouth See admin instructions. 2mg  on Sundays. Takes 1mg  on all other days. Takes in the morning) 45 tablet 6   No facility-administered medications prior to visit.    Review of Systems  Constitutional: Negative for chills and fever.  HENT: Negative.   Eyes: Negative.   Respiratory: Positive for shortness of breath. Negative for cough, hemoptysis and wheezing.   Cardiovascular: Positive for leg swelling and PND. Negative for chest pain.  Gastrointestinal: Negative for heartburn, nausea and vomiting.  Genitourinary: Negative.   Musculoskeletal: Negative for myalgias.       Chronic arthritis- related knee pain  Skin: Negative for rash.       Bruise from fall recently on upper back  Neurological: Negative.   Endo/Heme/Allergies: Bruises/bleeds easily.  Psychiatric/Behavioral: Negative.      Objective:   There were no vitals filed for this visit.   on  RA BMI Readings from Last 3 Encounters:  05/13/19 26.47 kg/m  04/27/19 25.31 kg/m  03/27/19 25.11 kg/m   Wt Readings from Last 3 Encounters:  05/13/19 169 lb (76.7 kg)  04/27/19 176 lb 5.9 oz (80 kg)  03/27/19 175 lb (79.4 kg)    Physical Exam   CBC    Component Value Date/Time   WBC 5.1 05/13/2019 1544   RBC 2.62 (L) 05/13/2019 1544   HGB 9.6 (L) 05/13/2019 1544   HCT 30.8 (L)  05/13/2019 1544   PLT 140 (L) 05/13/2019 1544   MCV 117.6 (H) 05/13/2019 1544   MCH 36.6 (H) 05/13/2019 1544   MCHC 31.2 05/13/2019 1544   RDW 16.9 (H) 05/13/2019 1544   LYMPHSABS 0.9 04/23/2019 1602   MONOABS 0.4 04/23/2019 1602   EOSABS 0.6 (H) 04/23/2019 1602   BASOSABS 0.0 04/23/2019 1602    Pleural fluid studies from 01/2017 reviewed, notable for protein 3.6 g/dL, LDH 112.  Cholesterol 34  Chest Imaging- films reviewed: Chest x-ray 09/09/2018- small to moderate dependent right-sided pleural effusion.  When compared to several previous x-rays, it looks stable recently, but has increased slowly in size since 2018.  It is present on every CXR in our system, dating back to 08/2015.  CXR, 1 view 04/23/2019-dependent right pleural effusion, essentially unchanged from previous.  Increased interstitial markings bilaterally.  Cardiomegaly.  Pulmonary Functions Testing Results: No flowsheet data found.  Pathology from thoracentesis 01/2017 : no malignant cells  Echocardiogram 10/2015: LVEF 55-60% with moderate LVH.  No regurgitation.  Normally functioning mechanical aortic valve.  Moderately dilated atria bilaterally.  Moderate tricuspid regurgitation and elevated PA systolic pressure     Assessment & Plan:   No diagnosis found.   Chronic right dependent pleural effusion- likely this is due to her chronic heart failure and difficult to manage volume status.  Although the fluid was exudative by light criteria, the low cholesterol is more suggestive of an issue of vascular permeability rather than an inflammatory process.  This would be supported by the duration without significant change in volume. -Recommended she discuss her diuretic regimen with her cardiologist.  She may need an increased regimen based on her reaccumulation of peripheral fluid since her regimen was decreased. -At this time I do not think she would benefit from additional fluid drainage.  She agrees and is averse to having  additional procedures.  In the future if  the fluid increases significantly in size or there is ever concern for a superimposed process, there would be indication for drainage at that time. -Follow-up in 6 months with a chest x-ray.  Chronic HFpEF, history of SAVR, mitral regurgitation, chronic anticoagulation with Coumadin -Her cardiac history makes her at high risk for pleural effusions, and contributes to having a difficult to manage diuretic regimen.  She is high risk for complications due to polypharmacy and age.  Will defer additional changes to her diuretic regimen to her cardiologist.   Current Outpatient Medications:  .  acetaminophen (TYLENOL) 500 MG tablet, Take 500 mg by mouth 2 (two) times daily as needed for mild pain or moderate pain. , Disp: , Rfl:  .  ALPRAZolam (XANAX) 0.5 MG tablet, Take 0.5 mg by mouth at bedtime as needed for sleep. , Disp: , Rfl:  .  bisacodyl (DULCOLAX) 5 MG EC tablet, Take 5 mg by mouth daily as needed for moderate constipation., Disp: , Rfl:  .  cetirizine (ZYRTEC) 10 MG tablet, Take 10 mg by mouth at bedtime as needed for allergies. , Disp: , Rfl: 11 .  fluticasone (FLONASE) 50 MCG/ACT nasal spray, Place 1 spray into both nostrils at bedtime. , Disp: , Rfl: 11 .  HYDROcodone-acetaminophen (NORCO) 10-325 MG tablet, Take 1 tablet by mouth 2 (two) times daily. , Disp: , Rfl: 0 .  metolazone (ZAROXOLYN) 10 MG tablet, Take 0.5 tablets (5 mg total) by mouth daily as needed (if weight increases by >2lbs)., Disp: , Rfl:  .  metoprolol succinate (TOPROL-XL) 50 MG 24 hr tablet, Take 2 tablets (100 mg total) by mouth every morning. Take with or immediately following a meal., Disp: 60 tablet, Rfl: 0 .  pantoprazole (PROTONIX) 40 MG tablet, Take 1 tablet (40 mg total) by mouth daily before breakfast., Disp: 30 tablet, Rfl: 5 .  polyethylene glycol powder (GLYCOLAX/MIRALAX) 17 GM/SCOOP powder, Take 17 g by mouth daily., Disp: , Rfl:  .  potassium chloride (KLOR-CON) 10  MEQ tablet, Take 10 mEq by mouth every morning., Disp: , Rfl:  .  simvastatin (ZOCOR) 40 MG tablet, Take 40 mg by mouth daily at 8 pm. , Disp: , Rfl:  .  torsemide (DEMADEX) 20 MG tablet, Take 2 tablets (40 mg total) by mouth 2 (two) times daily., Disp: 360 tablet, Rfl: 3 .  Vitamin D, Ergocalciferol, (DRISDOL) 1.25 MG (50000 UT) CAPS capsule, Take 50,000 Units by mouth every Sunday. , Disp: , Rfl:  .  warfarin (COUMADIN) 2 MG tablet, Take 1 tablet daily or as directed by Coumadin clinic (Patient taking differently: Take 1-2 mg by mouth See admin instructions. 2mg  on Sundays. Takes 1mg  on all other days. Takes in the morning), Disp: 45 tablet, Rfl: [facility] 06/05/2019 9:09 AM

## 2019-06-05 NOTE — Patient Instructions (Signed)
Increase dose to 1/2 tablet daily except 1 tablet on Tuesdays, Thursdays and Saturdays Continue 1 to 2 servings of greens a week Recheck in 3 weeks

## 2019-06-26 ENCOUNTER — Other Ambulatory Visit: Payer: Self-pay

## 2019-06-26 ENCOUNTER — Ambulatory Visit (INDEPENDENT_AMBULATORY_CARE_PROVIDER_SITE_OTHER): Payer: Medicare Other | Admitting: *Deleted

## 2019-06-26 DIAGNOSIS — Z5181 Encounter for therapeutic drug level monitoring: Secondary | ICD-10-CM

## 2019-06-26 DIAGNOSIS — Z952 Presence of prosthetic heart valve: Secondary | ICD-10-CM

## 2019-06-26 DIAGNOSIS — I4891 Unspecified atrial fibrillation: Secondary | ICD-10-CM

## 2019-06-26 LAB — POCT INR: INR: 2.2 (ref 2.0–3.0)

## 2019-06-26 NOTE — Patient Instructions (Signed)
Increase dose to 1 tablet daily except 1/2 tablet on Mondays, Wednesdays and Fridays Recheck in 4 weeks

## 2019-07-30 ENCOUNTER — Other Ambulatory Visit: Payer: Self-pay

## 2019-07-30 ENCOUNTER — Ambulatory Visit (INDEPENDENT_AMBULATORY_CARE_PROVIDER_SITE_OTHER): Payer: Medicare Other | Admitting: *Deleted

## 2019-07-30 DIAGNOSIS — Z952 Presence of prosthetic heart valve: Secondary | ICD-10-CM | POA: Diagnosis not present

## 2019-07-30 DIAGNOSIS — I4891 Unspecified atrial fibrillation: Secondary | ICD-10-CM

## 2019-07-30 DIAGNOSIS — Z5181 Encounter for therapeutic drug level monitoring: Secondary | ICD-10-CM | POA: Diagnosis not present

## 2019-07-30 LAB — POCT INR: INR: 1.9 — AB (ref 2.0–3.0)

## 2019-07-30 NOTE — Patient Instructions (Signed)
Take warfarin 1 1/2 tablets tonight then Increase dose to 1 tablet daily except 1/2 tablet on Sundays and Wednesdays Recheck in 2 weeks

## 2019-08-14 ENCOUNTER — Ambulatory Visit (INDEPENDENT_AMBULATORY_CARE_PROVIDER_SITE_OTHER): Payer: Medicare Other | Admitting: *Deleted

## 2019-08-14 ENCOUNTER — Other Ambulatory Visit: Payer: Self-pay

## 2019-08-14 DIAGNOSIS — I4891 Unspecified atrial fibrillation: Secondary | ICD-10-CM | POA: Diagnosis not present

## 2019-08-14 DIAGNOSIS — Z952 Presence of prosthetic heart valve: Secondary | ICD-10-CM

## 2019-08-14 DIAGNOSIS — Z5181 Encounter for therapeutic drug level monitoring: Secondary | ICD-10-CM | POA: Diagnosis not present

## 2019-08-14 LAB — POCT INR: INR: 1.9 — AB (ref 2.0–3.0)

## 2019-08-14 NOTE — Patient Instructions (Signed)
Take warfarin 2 tablets tonight then Increase dose to 1 tablet daily Recheck in 2 weeks

## 2019-08-24 ENCOUNTER — Other Ambulatory Visit: Payer: Self-pay

## 2019-08-24 ENCOUNTER — Encounter (HOSPITAL_COMMUNITY): Payer: Self-pay | Admitting: Emergency Medicine

## 2019-08-24 ENCOUNTER — Emergency Department (HOSPITAL_COMMUNITY)
Admission: EM | Admit: 2019-08-24 | Discharge: 2019-08-24 | Disposition: A | Discharge status: Home / Self Care | Payer: Medicare Other | Attending: Emergency Medicine | Admitting: Emergency Medicine

## 2019-08-24 DIAGNOSIS — Y929 Unspecified place or not applicable: Secondary | ICD-10-CM | POA: Diagnosis not present

## 2019-08-24 DIAGNOSIS — Y939 Activity, unspecified: Secondary | ICD-10-CM | POA: Insufficient documentation

## 2019-08-24 DIAGNOSIS — Y999 Unspecified external cause status: Secondary | ICD-10-CM | POA: Diagnosis not present

## 2019-08-24 DIAGNOSIS — Z5321 Procedure and treatment not carried out due to patient leaving prior to being seen by health care provider: Secondary | ICD-10-CM | POA: Insufficient documentation

## 2019-08-24 DIAGNOSIS — W010XXA Fall on same level from slipping, tripping and stumbling without subsequent striking against object, initial encounter: Secondary | ICD-10-CM | POA: Diagnosis not present

## 2019-08-24 DIAGNOSIS — S51812A Laceration without foreign body of left forearm, initial encounter: Secondary | ICD-10-CM | POA: Diagnosis not present

## 2019-08-24 NOTE — ED Triage Notes (Signed)
Patient fell on Wednesday and obtained skin tear to left forearm and left upper arm. Patient denies hitting head or LOC. Per patient stumbled and fell. Patient denies any other pain and denies any signs/symptoms of infection. Per patient husband made her come today for the skin tears. Patient wounds dressed with coban prior to triage. Dressing dry, clean, and intact.

## 2019-08-25 ENCOUNTER — Emergency Department (HOSPITAL_COMMUNITY)
Admission: EM | Admit: 2019-08-25 | Discharge: 2019-08-25 | Disposition: A | Discharge status: Home / Self Care | Payer: Medicare Other | Attending: Emergency Medicine | Admitting: Emergency Medicine

## 2019-08-25 ENCOUNTER — Encounter (HOSPITAL_COMMUNITY): Payer: Self-pay

## 2019-08-25 DIAGNOSIS — Y999 Unspecified external cause status: Secondary | ICD-10-CM | POA: Insufficient documentation

## 2019-08-25 DIAGNOSIS — N183 Chronic kidney disease, stage 3 unspecified: Secondary | ICD-10-CM | POA: Insufficient documentation

## 2019-08-25 DIAGNOSIS — Y939 Activity, unspecified: Secondary | ICD-10-CM | POA: Insufficient documentation

## 2019-08-25 DIAGNOSIS — I129 Hypertensive chronic kidney disease with stage 1 through stage 4 chronic kidney disease, or unspecified chronic kidney disease: Secondary | ICD-10-CM | POA: Diagnosis not present

## 2019-08-25 DIAGNOSIS — S51802A Unspecified open wound of left forearm, initial encounter: Secondary | ICD-10-CM | POA: Diagnosis not present

## 2019-08-25 DIAGNOSIS — Y929 Unspecified place or not applicable: Secondary | ICD-10-CM | POA: Diagnosis not present

## 2019-08-25 DIAGNOSIS — Z7901 Long term (current) use of anticoagulants: Secondary | ICD-10-CM | POA: Insufficient documentation

## 2019-08-25 DIAGNOSIS — Z09 Encounter for follow-up examination after completed treatment for conditions other than malignant neoplasm: Secondary | ICD-10-CM | POA: Diagnosis present

## 2019-08-25 DIAGNOSIS — Z79899 Other long term (current) drug therapy: Secondary | ICD-10-CM | POA: Diagnosis not present

## 2019-08-25 DIAGNOSIS — I4891 Unspecified atrial fibrillation: Secondary | ICD-10-CM | POA: Insufficient documentation

## 2019-08-25 DIAGNOSIS — Z5189 Encounter for other specified aftercare: Secondary | ICD-10-CM

## 2019-08-25 DIAGNOSIS — Z4800 Encounter for change or removal of nonsurgical wound dressing: Secondary | ICD-10-CM | POA: Diagnosis not present

## 2019-08-25 DIAGNOSIS — W1830XA Fall on same level, unspecified, initial encounter: Secondary | ICD-10-CM | POA: Diagnosis not present

## 2019-08-25 NOTE — ED Provider Notes (Signed)
[facility] EMERGENCY DEPARTMENT Provider Note   CSN: [id number] Arrival date & time: 08/25/19  1045     History Chief Complaint  Patient presents with  . Wound Check  . Dressing Change    Mary [name] is a 79 y.o. female presenting to the emergency department for wound check and dressing change.  She checked into an ED yesterday for evaluation of a mechanical fall that occurred on Wednesday.  She left prior to being evaluated due to extended wait times, however patient's wound was dressed in triage.  She presents today with dressing in place with request for wound recheck and dressing change.  She had a mechanical fall which caused a skin tear to the left forearm.  She did not hit her head or have loss of consciousness.  She denies any other injuries associated.  She is on Coumadin for A. fib.  The history is provided by the patient.       Past Medical History:  Diagnosis Date  . Anxiety   . Diabetes mellitus with neuropathy ([place])   . Diabetic neuropathy ([place])   . DJD (degenerative joint disease)   . H/O aortic valve replacement   . Hyperlipidemia   . Hypertension   . Neuromuscular disorder ([facility])    neuropathy in feet  . Obesity   . Sleep apnea   . Stroke [facility])    " light stroke"  . Wears glasses     Patient Active Problem List   Diagnosis Date Noted  . Anasarca 04/25/2019  . Hypoalbuminemia 04/25/2019  . Persistent atrial fibrillation ([place]) 04/25/2019  . CKD (chronic kidney disease), stage III 04/25/2019  . Anemia due to chronic kidney disease 04/25/2019  . Pressure injury of skin 04/24/2019  . Fall 04/23/2019  . S/P right knee arthroscopy 04/13/2015  . OSA (obstructive sleep apnea) 01/01/2013  . Diabetes ([place]) 10/23/2012  . Aortic valve disorder 06/30/2010  . CVA (cerebral vascular accident) ([place]) 06/30/2010  . S/P aortic valve replacement 06/30/2010  . Hyperlipidemia 09/29/2008  . Obesity 09/29/2008  . Essential hypertension 09/29/2008  .  DEGENERATIVE JOINT DISEASE 09/29/2008    Past Surgical History:  Procedure Laterality Date  . ANKLE SURGERY     Left tendon repair  . AORTIC VALVE REPLACEMENT  03/1994  . CARDIAC CATHETERIZATION     1996 [facility]  . CERVICAL SPINE SURGERY    . COLONOSCOPY    . HEMORRHOID SURGERY    . KNEE ARTHROSCOPY Right 04/13/2015   Procedure: RIGHT KNEE ARTHROSCOPY WITH DEBRIDEMENT AND PARTIAL MEDIAL MENISCECTOMY;  Surgeon: Mcarthur Rossetti, MD;  Location: [facility];  Service: Orthopedics;  Laterality: Right;  . KNEE ARTHROSCOPY W/ MENISCECTOMY Right 04/13/2015  . LUMBAR FUSION       OB History   No obstetric history on file.     Family History  Problem Relation Age of Onset  . Pneumonia Sister   . Dementia Mother   . Diabetes Sister     Social History   Tobacco Use  . Smoking status: Never Smoker  . Smokeless tobacco: Never Used  Substance Use Topics  . Alcohol use: No    Alcohol/week: 0.0 standard drinks  . Drug use: No    Home Medications Prior to Admission medications   Medication Sig Start Date End Date Taking? Authorizing Provider  acetaminophen (TYLENOL) 500 MG tablet Take 500 mg by mouth 2 (two) times daily as needed for mild pain or moderate pain.     [provider]  ALPRAZolam (XANAX) 0.5 MG tablet Take 0.5 mg by mouth at bedtime as needed for sleep.     [provider]  bisacodyl (DULCOLAX) 5 MG EC tablet Take 5 mg by mouth daily as needed for moderate constipation.    [provider]  cetirizine (ZYRTEC) 10 MG tablet Take 10 mg by mouth at bedtime as needed for allergies.  02/16/15   [provider]  fluticasone (FLONASE) 50 MCG/ACT nasal spray Place 1 spray into both nostrils at bedtime.  01/13/15   [provider]  HYDROcodone-acetaminophen (NORCO) 10-325 MG tablet Take 1 tablet by mouth 2 (two) times daily.  12/18/15   [provider]  metolazone (ZAROXOLYN) 10 MG tablet Take 0.5 tablets (5 mg total) by  mouth daily as needed (if weight increases by >2lbs). 04/27/19   Kathie Dike, MD  metoprolol succinate (TOPROL-XL) 50 MG 24 hr tablet Take 2 tablets (100 mg total) by mouth every morning. Take with or immediately following a meal. 04/27/19   Kathie Dike, MD  pantoprazole (PROTONIX) 40 MG tablet Take 1 tablet (40 mg total) by mouth daily before breakfast. 03/21/16   Rehman, Mechele Dawley, MD  polyethylene glycol powder (GLYCOLAX/MIRALAX) 17 GM/SCOOP powder Take 17 g by mouth daily.    [provider]  potassium chloride (KLOR-CON) 10 MEQ tablet Take 10 mEq by mouth every morning. 04/05/19   [provider]  simvastatin (ZOCOR) 40 MG tablet Take 40 mg by mouth daily at 8 pm.  10/16/12   [provider]  torsemide (DEMADEX) 20 MG tablet Take 2 tablets (40 mg total) by mouth 2 (two) times daily. 04/15/19   Arnoldo Lenis, MD  Vitamin D, Ergocalciferol, (DRISDOL) 1.25 MG (50000 UT) CAPS capsule Take 50,000 Units by mouth every Sunday.     [provider]  warfarin (COUMADIN) 2 MG tablet Take 1 tablet daily or as directed by Coumadin clinic Patient taking differently: Take 1-2 mg by mouth See admin instructions. 2mg  on Sundays. Takes 1mg  on all other days. Takes in the morning 09/17/18   Branch, Alphonse Guild, MD    Allergies    Tape  Review of Systems   Review of Systems  All other systems reviewed and are negative.   Physical Exam Updated Vital Signs BP (!) 123/49 (BP Location: Right Arm)   Pulse 64   Temp 98 F (36.7 C) (Oral)   Resp 18   Ht 5\' 5"  (1.651 m)   Wt 80 kg   SpO2 99%   BMI 29.35 kg/m   Physical Exam Vitals and nursing note reviewed.  Constitutional:      General: She is not in acute distress.    Appearance: She is well-developed.  HENT:     Head: Normocephalic and atraumatic.  Eyes:     Conjunctiva/sclera: Conjunctivae normal.  Cardiovascular:     Rate and Rhythm: Normal rate.  Pulmonary:     Effort: Pulmonary effort is normal.    Skin:    Comments: Clean dressing in place to the left forearm.  Dressing removed, there is appears to be a skin tear to the left dorsal forearm with blood clot in place.  Wound is not actively bleeding.  No redness or significant swelling, no purulent drainage. There is some localized tenderness about the wound though no bony tenderness of the forearm.  Normal range of motion of the wrist and elbow without pain.   Neurological:     Mental Status: She is alert.  Psychiatric:        Mood and Affect: Mood normal.        Behavior: Behavior normal.     ED Results / Procedures / Treatments   Labs (all labs ordered are listed, but only abnormal results are displayed) Labs Reviewed - No data to display  EKG None  Radiology No results found.  Procedures Procedures (including critical care time)  Medications Ordered in ED Medications - No data to display  ED Course  I have reviewed the triage vital signs and the nursing notes.  Pertinent labs & imaging results that were available during my care of the patient were reviewed by me and considered in my medical decision making (see chart for details).    MDM Rules/Calculators/A&P                      Patient presenting for wound check of skin tear to left forearm that occurred on Wednesday after a mechanical fall.  She is on Coumadin and initially had some bleeding though it has since achieved hemostasis.  She is only complaining of some pain localized to the wound, however no pain to the remainder of the forearm, wrist or elbow.  She did not hit her head nor is she having any neck pain.  Examination of wound is reassuring, no signs of infection.  There is a blood clot in place and it is not actively bleeding.  Wound was redressed with nonadherent gauze, gauze roll and Coban.  She is instructed to follow-up with her PCP in 3 days for wound recheck.  Return precautions discussed.  Safe for discharge.  Final Clinical Impression(s) / ED  Diagnoses Final diagnoses:  Dressing change  Visit for wound check    Rx / DC Orders ED Discharge Orders    None       Voncille Simm, [place] N, PA-C 08/25/19 1327    Veryl Speak, MD 08/25/19 [phone]

## 2019-08-25 NOTE — ED Triage Notes (Signed)
Pt arrives POV for eval of L wrist wound. Was seen yesterday at [facility] for same, states "they didn't do anything". Pt reports that she would like the wound looked at and dsg changed. Dsg in place from [facility] yesterday.

## 2019-08-25 NOTE — Discharge Instructions (Signed)
Please keep your wound clean. Follow up with your primary care provider this week for recheck. Apply steady pressure to your wound if it begins bleeding once more. If you cannot get the bleeding to stop, report to urgent care or the ER.

## 2019-08-26 ENCOUNTER — Telehealth: Payer: Self-pay | Admitting: Cardiology

## 2019-08-26 NOTE — Telephone Encounter (Signed)
Reviewed patient chart.  She did go to the ED yesterday and had would evaluated.  Exam revealed a skin tear  On wrist with dried blood clot.  No active bleeding.  Site was redressed and she was stable to go home.  She is to follow up with PCP in 3 days per ED MD.

## 2019-08-26 NOTE — Telephone Encounter (Signed)
Lonna Duval from Dr. Burnard Hawthorne office called and left a message as an Juluis Rainier stating she received a phone call from Mary [name] husband yesterday afternoon that Mary [name] fell in the bathroom 9 days ago and lacerated her wrist.  Mr. Summerhill told her they went to [facility] on 08/24/2019 to be seen in the ER but left AMA due to the wait times. Mr. Pelle stated the patient would bleed a lot when he would try to change the dressing and that she's still taking her coumadin like normal.   Mardene Celeste advised Mr. Salmons to take her to [facility] to be seen yesterday and to have them check her INR

## 2019-08-28 ENCOUNTER — Ambulatory Visit (INDEPENDENT_AMBULATORY_CARE_PROVIDER_SITE_OTHER): Payer: Medicare Other | Admitting: Pharmacist

## 2019-08-28 DIAGNOSIS — I359 Nonrheumatic aortic valve disorder, unspecified: Secondary | ICD-10-CM

## 2019-08-28 DIAGNOSIS — I4891 Unspecified atrial fibrillation: Secondary | ICD-10-CM | POA: Diagnosis not present

## 2019-08-28 DIAGNOSIS — Z5181 Encounter for therapeutic drug level monitoring: Secondary | ICD-10-CM | POA: Diagnosis not present

## 2019-08-28 DIAGNOSIS — Z952 Presence of prosthetic heart valve: Secondary | ICD-10-CM | POA: Diagnosis not present

## 2019-08-28 LAB — POCT INR: INR: 2.5 (ref 2.0–3.0)

## 2019-08-28 MED ORDER — WARFARIN SODIUM 2 MG PO TABS: ORAL_TABLET | ORAL | 1 refills | Status: DC

## 2019-08-28 NOTE — Patient Instructions (Signed)
Description   Continue taking  1 tablet daily.  Recheck in 4 weeks.      

## 2019-09-01 ENCOUNTER — Telehealth: Payer: Self-pay | Admitting: Cardiology

## 2019-09-01 NOTE — Telephone Encounter (Signed)
Pt. Received a call to reschedule wifes appt. He says person called name was Otila Kluver. He is requesting a call back

## 2019-09-01 NOTE — Telephone Encounter (Signed)
Jarrett Soho called pt today, will forward

## 2019-09-03 NOTE — Progress Notes (Deleted)
Cardiology Office Note    Date:  09/03/2019   ID:  Mary [name], Mary [name] 1940/05/09, MRN [id number]  PCP:  [name], MD  Cardiologist: Carlyle Dolly, MD EPS: None  No chief complaint on file.   History of Present Illness:  Mary [name] is a 79 y.o. female with history of persistent atrial fibrillation on Coumadin Toprol, chronic diastolic CHF, history of recurrent pleural effusions, aortic valve replacement in 1995, HTN, HLD, OSA (intolerant to CPAP), orthostatic hypotension and history of CVA   She was admitted to [facility] on 04/23/2019 for evaluation of a non-healing sacral wound and reported worsening weakness. Torsemide had been titrated to 40mg  BID in 03/2019 and she reported her PCP had started her on Metolazone 10mg  daily the week prior. Creatinine was elevated to 1.58 on admission which led to both Torsemide and Metolazone being held. BNP was elevated to 584 and CXR showed a moderate right pleural effusion which was similar to prior tracings. Follow-up echo showed a preserved EF of 60-65% with moderately increased LVH. RV function was moderately reduced. She had severe biatrial dilation with moderate MR and severe TR. AI was trivial with no evidence of stenosis. She was transitioned to Torsemide 40mg  BID at discharge and Metolazone was changed to 5mg  as needed for weight gain. Weight at discharge was 176 lbs and creatinine had improved to 1.43.    Saw Bernerd Pho, PA-C 05/13/2019 at which time she was doing well and hardly ever taking metolazone.    Past Medical History:  Diagnosis Date   Anxiety    Diabetes mellitus with neuropathy (HCC)    Diabetic neuropathy ([place])    DJD (degenerative joint disease)    H/O aortic valve replacement    Hyperlipidemia    Hypertension    Neuromuscular disorder ([place])    neuropathy in feet   Obesity    Sleep apnea    Stroke ([place])    " light stroke"   Wears glasses     Past Surgical History:  Procedure Laterality  Date   ANKLE SURGERY     Left tendon repair   AORTIC VALVE REPLACEMENT  03/1994   CARDIAC CATHETERIZATION     1996 [place]   CERVICAL SPINE SURGERY     COLONOSCOPY     HEMORRHOID SURGERY     KNEE ARTHROSCOPY Right 04/13/2015   Procedure: RIGHT KNEE ARTHROSCOPY WITH DEBRIDEMENT AND PARTIAL MEDIAL MENISCECTOMY;  Surgeon: Mcarthur Rossetti, MD;  Location: [place];  Service: Orthopedics;  Laterality: Right;   KNEE ARTHROSCOPY W/ MENISCECTOMY Right 04/13/2015   LUMBAR FUSION      Current Medications: No outpatient medications have been marked as taking for the 09/09/19 encounter (Appointment) with Imogene Burn, PA-C.     Allergies:   Tape   Social History   Socioeconomic History   Marital status: Married    Spouse name: Not on file   Number of children: Not on file   Years of education: Not on file   Highest education level: Not on file  Occupational History   Occupation: Disabled    Employer: RETIRED  Tobacco Use   Smoking status: Never Smoker   Smokeless tobacco: Never Used  Scientific laboratory technician Use: Never used  Substance and Sexual Activity   Alcohol use: No    Alcohol/week: 0.0 standard drinks   Drug use: No   Sexual activity: Not on file  Other Topics Concern   Not on file  Social History  Narrative   Widowed   No regular exercise   2 children   Social Determinants of Radio broadcast assistant Strain:    Difficulty of Paying Living Expenses:   Food Insecurity:    Worried About Charity fundraiser in the Last Year:    Arboriculturist in the Last Year:   Transportation Needs:    Film/video editor (Medical):    Lack of Transportation (Non-Medical):   Physical Activity:    Days of Exercise per Week:    Minutes of Exercise per Session:   Stress:    Feeling of Stress :   Social Connections:    Frequency of Communication with Friends and Family:    Frequency of Social Gatherings with Friends and Family:     Attends Religious Services:    Active Member of Clubs or Organizations:    Attends Music therapist:    Marital Status:      Family History:  The patient's ***family history includes Dementia in her mother; Diabetes in her sister; Pneumonia in her sister.   ROS:   Please see the history of present illness.    ROS All other systems reviewed and are negative.   PHYSICAL EXAM:   VS:  There were no vitals taken for this visit.  Physical Exam  GEN: Well nourished, well developed, in no acute distress  HEENT: normal  Neck: no JVD, carotid bruits, or masses Cardiac:RRR; no murmurs, rubs, or gallops  Respiratory:  clear to auscultation bilaterally, normal work of breathing GI: soft, nontender, nondistended, + BS Ext: without cyanosis, clubbing, or edema, Good distal pulses bilaterally MS: no deformity or atrophy  Skin: warm and dry, no rash Neuro:  Alert and Oriented x 3, Strength and sensation are intact Psych: euthymic mood, full affect  Wt Readings from Last 3 Encounters:  08/25/19 176 lb 5.9 oz (80 kg)  08/24/19 175 lb (79.4 kg)  05/13/19 169 lb (76.7 kg)      Studies/Labs Reviewed:   EKG:  EKG is*** ordered today.  The ekg ordered today demonstrates ***  Recent Labs: 04/23/2019: B Natriuretic Peptide 584.0 04/24/2019: ALT 7 05/13/2019: BUN 26; Creatinine, Ser 1.42; Hemoglobin 9.6; Platelets 140; Potassium 3.6; Sodium 135   Lipid Panel No results found for: CHOL, TRIG, HDL, CHOLHDL, VLDL, LDLCALC, LDLDIRECT  Additional studies/ records that were reviewed today include:  2D echo 2/4/2021IMPRESSIONS     1. Left ventricular ejection fraction, by visual estimation, is 60 to  65%. The left ventricle has normal function. There is moderately increased  left ventricular hypertrophy.   2. Elevated left ventricular end-diastolic pressure.   3. Left ventricular diastolic parameters are indeterminate.   4. Right ventricular volume and pressure overload.   5. The  left ventricle has no regional wall motion abnormalities.   6. Global right ventricle has moderately reduced systolic function.The  right ventricular size is severely enlarged. No increase in right  ventricular wall thickness.   7. Left atrial size was severely dilated.   8. Right atrial size was severely dilated.   9. Moderate mitral annular calcification.  10. The mitral valve is grossly normal. Moderate mitral valve  regurgitation.  11. The tricuspid valve is grossly normal.  12. The tricuspid valve is grossly normal. Tricuspid valve regurgitation  is severe.  13. Aortic valve regurgitation is trivial. No evidence of aortic valve  sclerosis or stenosis.  14. Pulmonic regurgitation is mild.  15. The pulmonic valve was grossly normal.  Pulmonic valve regurgitation is  mild.  16. Severely elevated pulmonary artery systolic pressure.  17. The inferior vena cava is dilated in size with <50% respiratory  variability, suggesting right atrial pressure of 15 mmHg.  18. There is left bowing of the interatrial septum, suggestive of elevated  right atrial pressure.   FINDINGS   Left Ventricle: Left ventricular ejection fraction, by visual estimation,  is 60 to 65%. The left ventricle has normal function. The left ventricle  has no regional wall motion abnormalities. The left ventricular internal  cavity size was the left  ventricle is normal in size. There is moderately increased left  ventricular hypertrophy. Asymmetric left ventricular hypertrophy of the  posterior wall. The interventricular septum is flattened in systole and  diastole, consistent with right ventricular  pressure and volume overload. Left ventricular diastolic parameters are  indeterminate. Elevated left ventricular end-diastolic pressure.   Right Ventricle: The right ventricular size is severely enlarged. No  increase in right ventricular wall thickness. Global RV systolic function  is has moderately reduced systolic  function. The tricuspid regurgitant  velocity is 3.99 m/s, and with an  assumed right atrial pressure of 10 mmHg, the estimated right ventricular  systolic pressure is severely elevated at 73.6 mmHg.   Left Atrium: Left atrial size was severely dilated.   Right Atrium: Right atrial size was severely dilated   Pericardium: There is no evidence of pericardial effusion.   Mitral Valve: The mitral valve is grossly normal. Moderate mitral annular  calcification. Moderate mitral valve regurgitation.   Tricuspid Valve: The tricuspid valve is grossly normal. Tricuspid valve  regurgitation is severe.   Aortic Valve: The aortic valve has been repaired/replaced. Aortic valve  regurgitation is trivial. The aortic valve is structurally normal, with no  evidence of sclerosis or stenosis. Aortic valve mean gradient measures 5.7  mmHg. Aortic valve peak gradient   measures 8.2 mmHg. St. Jude valve is present in the aortic position. Echo  findings show normal structure and function of the aortic prosthesis.   Pulmonic Valve: The pulmonic valve was grossly normal. Pulmonic valve  regurgitation is mild. Pulmonic regurgitation is mild.   Aorta: The aortic root is normal in size and structure.   Venous: The inferior vena cava is dilated in size with less than 50%  respiratory variability, suggesting right atrial pressure of 15 mmHg.   IAS/Shunts: There is left bowing of the interatrial septum, suggestive of  elevated right atrial pressure. No atrial level shunt detected by color  flow Doppler.      ASSESSMENT:    1. Chronic diastolic CHF (congestive heart failure) ([place])   2. Longstanding persistent atrial fibrillation ([place])   3. S/P AVR (aortic valve replacement)   4. Hyperlipidemia, unspecified hyperlipidemia type   5. OSA (obstructive sleep apnea)   6. Stage 3 chronic kidney disease, unspecified whether stage 3a or 3b CKD      PLAN:  In order of problems listed above:  Diastolic CHF  Echo 04/29/7987 LVEF 60 to 65% with moderate LVH, moderately reduced RV function along with moderate TR and severe TR.  On torsemide 40 mg twice daily and metolazone 5 mg as needed  Persistent atrial fibrillation on Coumadin and Toprol  Status post AVR 1995 on Coumadin.  Trivial AI on echo 04/2019  Hyperlipidemia managed by PCP on simvastatin  OSA intolerant to CPAP  CKD stage III in 1.42-2/23/2021  Medication Adjustments/Labs and Tests Ordered: Current medicines are reviewed at length with the patient today.  Concerns regarding medicines are outlined above.  Medication changes, Labs and Tests ordered today are listed in the Patient Instructions below. There are no Patient Instructions on file for this visit.   Signed, Ermalinda Barrios, PA-C  09/03/2019 3:14 PM    [facility] [address], [place], [place]  [address] Phone: [phone]; Fax: [phone]

## 2019-09-08 ENCOUNTER — Ambulatory Visit: Payer: Medicare Other | Admitting: Cardiology

## 2019-09-09 ENCOUNTER — Ambulatory Visit: Payer: Medicare Other | Admitting: Physician Assistant

## 2019-09-17 NOTE — Progress Notes (Signed)
Cardiology Office Note    Date:  09/29/2019   ID:  Mary [name], Mary [name] 10-09-1940, MRN [id number]  PCP:  [name], MD  Cardiologist: Carlyle Dolly, MD EPS: None  No chief complaint on file.   History of Present Illness:  Mary [name] is a 79 y.o. female history of persistent atrial fibrillation, chronic diastolic CHF, history of recurrent pleural effusions, aortic valve replacement in 1995, HTN, HLD, OSA (intolerant to CPAP), orthostatic hypotension and history of CVA   admitted to [facility] on 04/23/2019 for evaluation of a non-healing sacral wound and reported worsening weakness. Torsemide had been titrated to 15m BID in 03/2019 and she reported her PCP had started her on Metolazone 137mdaily the week prior. Creatinine was elevated to 1.58 on admission which led to both Torsemide and Metolazone being held. BNP was elevated to 584 and CXR showed a moderate right pleural effusion which was similar to prior tracings. Follow-up echo showed a preserved EF of 60-65% with moderately increased LVH. RV function was moderately reduced. She had severe biatrial dilation with moderate MR and severe TR. AI was trivial with no evidence of stenosis. She was transitioned to Torsemide 4058mID at discharge and Metolazone was changed to 5mg11m needed for weight gain. Weight at discharge was 176 lbs and creatinine had improved to 1.43.   Patient comes in for f/u accompanied by her husband. She is eating a [employer] biscuit daily.Drinking about 80 ounces water daily. Eats ice all day long. Taking zaroxolyn 10 mg every other day because of weight gain. Despite weight loss-down to 156 lbs she has excessive edema and anasarca.      Past Medical History:  Diagnosis Date  . Anxiety   . 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 Diabetic neuropathy (HCC)[place]. DJD (degenerative joint disease)   . H/O aortic valve replacement   . Hyperlipidemia   . Hypertension   . Neuromuscular disorder (HCC)     neuropathy in feet  . Obesity   . Sleep apnea   . [facility] " [facility]"  . Wears glasses     Past Surgical History:  Procedure Laterality Date  . ANKLE SURGERY     Left tendon repair  . AORTIC VALVE REPLACEMENT  03/1994  . [facility]ARDIAC [facility]     [facility]    . COLONOSCOPY    . HEMORRHOID SURGERY    . KNEE ARTHROSCOPY Right 04/13/2015   Procedure: RIGHT KNEE ARTHROSCOPY WITH DEBRIDEMENT AND PARTIAL MEDIAL MENISCECTOMY;  Surgeon: ChriMcarthur Rossetti;  [facility]opedics;  Laterality: Right;  . KNEE ARTHROSCOPY W/ MENISCECTOMY Right 04/13/2015  . LUMBAR FUSION      Current Medications: Current Meds  Medication Sig  . acetaminophen (TYLENOL) 500 MG tablet Take 500 mg by mouth 2 (two) times daily as needed for mild pain or moderate pain.   . ALMarland KitchenRAZolam (XANAX) 0.5 MG tablet Take 0.5 mg by mouth at bedtime as needed for sleep.   . bisacodyl (DULCOLAX) 5 MG EC tablet Take 5 mg by mouth daily as needed for moderate constipation.  . cetirizine (ZYRTEC) 10 MG tablet Take 10 mg by mouth at bedtime as needed for allergies.   . fluticasone (FLONASE) 50 MCG/ACT nasal spray Place 1 spray into both nostrils at bedtime.   . HYMarland KitchenROcodone-acetaminophen (NORCO) 10-325 MG tablet Take 1 tablet by mouth 2 (two) times daily.   .Marland Kitchen  metolazone (ZAROXOLYN) 10 MG tablet Take 0.5 tablets (5 mg total) by mouth daily as needed (if weight increases by >2lbs).  . metoprolol succinate (TOPROL-XL) 50 MG 24 hr tablet Take 2 tablets (100 mg total) by mouth every morning. Take with or immediately following a meal.  . pantoprazole (PROTONIX) 40 MG tablet Take 1 tablet (40 mg total) by mouth daily before breakfast.  . polyethylene glycol powder (GLYCOLAX/MIRALAX) 17 GM/SCOOP powder Take 17 g by mouth daily.  . potassium chloride (KLOR-CON) 10 MEQ tablet Take 10 mEq by mouth every morning.  . simvastatin (ZOCOR) 40 MG tablet Take 40 mg by mouth daily  at 8 pm.   . torsemide (DEMADEX) 20 MG tablet Take 2 tablets (40 mg total) by mouth 2 (two) times daily.  . Vitamin D, Ergocalciferol, (DRISDOL) 1.25 MG (50000 UT) CAPS capsule Take 50,000 Units by mouth every Sunday.   . warfarin (COUMADIN) 2 MG tablet Take 1 tablet daily or as directed by Coumadin clinic     Allergies:   Tape   Social History   Socioeconomic History  . Marital status: Married    Spouse name: Not on file  . Number of children: Not on file  . Years of education: Not on file  . Highest education level: Not on file  Occupational History  . Occupation: Disabled    Employer: RETIRED  Tobacco Use  . Smoking status: Never Smoker  . Smokeless tobacco: Never Used  Vaping Use  . Vaping Use: Never used  Substance and Sexual Activity  . Alcohol use: No    Alcohol/week: 0.0 standard drinks  . Drug use: No  . Sexual activity: Not on file  Other Topics Concern  . Not on file  Social History Narrative   Widowed   No regular exercise   2 children   Social Determinants of Health   Financial Resource Strain:   . Difficulty of Paying Living Expenses:   Food Insecurity:   . Worried About Charity fundraiser in the Last Year:   . Arboriculturist in the Last Year:   Transportation Needs:   . Film/video editor (Medical):   Marland Kitchen Lack of Transportation (Non-Medical):   Physical Activity:   . Days of Exercise per Week:   . Minutes of Exercise per Session:   Stress:   . Feeling of Stress :   Social Connections:   . Frequency of Communication with Friends and Family:   . Frequency of Social Gatherings with Friends and Family:   . Attends Religious Services:   . Active Member of Clubs or Organizations:   . Attends Archivist Meetings:   Marland Kitchen Marital Status:      Family History:  The patient's family history includes Dementia in her mother; Diabetes in her sister; Pneumonia in her sister.   ROS:   Please see the history of present illness.    ROS All other  systems reviewed and are negative.   PHYSICAL EXAM:   VS:  BP (!) 92/52   Pulse 67   Ht '5\' 5"'  (1.651 m)   Wt 156 lb (70.8 kg)   SpO2 96%   BMI 25.96 kg/m   Physical Exam  GEN: Well nourished, well developed, in no acute distress  Neck: increased JVD, no carotid bruits, or masses Cardiac:RRR; 2/6 to 3/6 systolic murmur at the left sternal border Respiratory:  clear to auscultation bilaterally, normal work of breathing GI: Distended, anasarca Ext: w +2-3 edema, Good  distal pulses bilaterally Neuro:  Alert and Oriented x 3 Psych: euthymic mood, full affect  Wt Readings from Last 3 Encounters:  09/29/19 156 lb (70.8 kg)  08/25/19 176 lb 5.9 oz (80 kg)  08/24/19 175 lb (79.4 kg)      Studies/Labs Reviewed:   EKG:  EKG is not ordered today.    Recent Labs: 04/23/2019: B Natriuretic Peptide 584.0 04/24/2019: ALT 7 05/13/2019: BUN 26; Creatinine, Ser 1.42; Hemoglobin 9.6; Platelets 140; Potassium 3.6; Sodium 135   Lipid Panel No results found for: CHOL, TRIG, HDL, CHOLHDL, VLDL, LDLCALC, LDLDIRECT  Additional studies/ records that were reviewed today include:  Echo 04/24/19 IMPRESSIONS     1. Left ventricular ejection fraction, by visual estimation, is 60 to  65%. The left ventricle has normal function. There is moderately increased  left ventricular hypertrophy.   2. Elevated left ventricular end-diastolic pressure.   3. Left ventricular diastolic parameters are indeterminate.   4. Right ventricular volume and pressure overload.   5. The left ventricle has no regional wall motion abnormalities.   6. Global right ventricle has moderately reduced systolic function.The  right ventricular size is severely enlarged. No increase in right  ventricular wall thickness.   7. Left atrial size was severely dilated.   8. Right atrial size was severely dilated.   9. Moderate mitral annular calcification.  10. The mitral valve is grossly normal. Moderate mitral valve  regurgitation.    11. The tricuspid valve is grossly normal.  12. The tricuspid valve is grossly normal. Tricuspid valve regurgitation  is severe.  13. Aortic valve regurgitation is trivial. No evidence of aortic valve  sclerosis or stenosis.  14. Pulmonic regurgitation is mild.  15. The pulmonic valve was grossly normal. Pulmonic valve regurgitation is  mild.  16. Severely elevated pulmonary artery systolic pressure.  17. The inferior vena cava is dilated in size with <50% respiratory  variability, suggesting right atrial pressure of 15 mmHg.  18. There is left bowing of the interatrial septum, suggestive of elevated  right atrial pressure.   FINDINGS   Left Ventricle: Left ventricular ejection fraction, by visual estimation,  is 60 to 65%. The left ventricle has normal function. The left ventricle  has no regional wall motion abnormalities. The left ventricular internal  cavity size was the left  ventricle is normal in size. There is moderately increased left  ventricular hypertrophy. Asymmetric left ventricular hypertrophy of the  posterior wall. The interventricular septum is flattened in systole and  diastole, consistent with right ventricular  pressure and volume overload. Left ventricular diastolic parameters are  indeterminate. Elevated left ventricular end-diastolic pressure.   Right Ventricle: The right ventricular size is severely enlarged. No  increase in right ventricular wall thickness. Global RV systolic function  is has moderately reduced systolic function. The tricuspid regurgitant  velocity is 3.99 m/s, and with an  assumed right atrial pressure of 10 mmHg, the estimated right ventricular  systolic pressure is severely elevated at 73.6 mmHg.   Left Atrium: Left atrial size was severely dilated.   Right Atrium: Right atrial size was severely dilated   Pericardium: There is no evidence of pericardial effusion.   Mitral Valve: The mitral valve is grossly normal. Moderate mitral  annular  calcification. Moderate mitral valve regurgitation.   Tricuspid Valve: The tricuspid valve is grossly normal. Tricuspid valve  regurgitation is severe.   Aortic Valve: The aortic valve has been repaired/replaced. Aortic valve  regurgitation is trivial. The aortic valve is  structurally normal, with no  evidence of sclerosis or stenosis. Aortic valve mean gradient measures 5.7  mmHg. Aortic valve peak gradient   measures 8.2 mmHg. St. Jude valve is present in the aortic position. Echo  findings show normal structure and function of the aortic prosthesis.   Pulmonic Valve: The pulmonic valve was grossly normal. Pulmonic valve  regurgitation is mild. Pulmonic regurgitation is mild.   Aorta: The aortic root is normal in size and structure.   Venous: The inferior vena cava is dilated in size with less than 50%  respiratory variability, suggesting right atrial pressure of 15 mmHg.   IAS/Shunts: There is left bowing of the interatrial septum, suggestive of  elevated right atrial pressure. No atrial level shunt detected by color  flow Doppler.     ASSESSMENT:    1. Acute on chronic diastolic heart failure ([place])   2. Longstanding persistent atrial fibrillation ([place])   3. S/P aortic valve replacement   4. Hyperlipidemia, unspecified hyperlipidemia type   5. OSA (obstructive sleep apnea)   6. Stage 3 chronic kidney disease, unspecified whether stage 3a or 3b CKD      PLAN:  In order of problems listed above:  Acute on chronic diastolic CHF Echo 09/26/1503 LVEF 60 to 65% with moderate LVH, moderately reduced RV function along with moderate TR and severe TR.  On torsemide 40 mg twice daily and metolazone 10 mg every other day per PCP. Despite 20 pound weight loss she has significant edema and anasarca. Will check c-Met, CBC, TSH, B12. Dr. Karie Kirks mentioned doing a abdominal CT which I agree with but will allow him to order. Will not make adjustments in diuretics until we get her  labs back. She needs to limit her sodium intake and water to 1 L a day.  Persistent atrial fibrillation on Coumadin and Toprol controlled  Status post AVR 1995 on Coumadin.  Trivial AI on echo 04/2019 but moderate MR and severe TR  Hyperlipidemia managed by PCP on simvastatin  OSA intolerant to CPAP  CKD stage III in 1.42-2/23/2021-saw kidney specialist once but never went back.     Medication Adjustments/Labs and Tests Ordered: Current medicines are reviewed at length with the patient today.  Concerns regarding medicines are outlined above.  Medication changes, Labs and Tests ordered today are listed in the Patient Instructions below. Patient Instructions   Medication Instructions:  Your physician recommends that you continue on your current medications as directed. Please refer to the Current Medication list given to you today.  *If you need a refill on your cardiac medications before your next appointment, please call your pharmacy*   Lab Work: Your physician recommends that you return for lab work in: Today   If you have labs (blood work) drawn today and your tests are completely normal, you will receive your results only by: Marland Kitchen MyChart Message (if you have MyChart) OR . A paper copy in the mail If you have any lab test that is abnormal or we need to change your treatment, we will call you to review the results.   Testing/Procedures: NONE    Follow-Up: At [facility], you and your health needs are our priority.  As part of our continuing mission to provide you with exceptional heart care, we have created designated Provider Care Teams.  These Care Teams include your primary Cardiologist (physician) and Advanced Practice Providers (APPs -  Physician Assistants and Nurse Practitioners) who all work together to provide you with the care you  need, when you need it.  We recommend signing up for the patient portal called "MyChart".  Sign up information is provided on this  After Visit Summary.  MyChart is used to connect with patients for Virtual Visits (Telemedicine).  Patients are able to view lab/test results, encounter notes, upcoming appointments, etc.  Non-urgent messages can be sent to your provider as well.   To learn more about what you can do with MyChart, go to [email or web address].    Your next appointment:    Next Available   The format for your next appointment:   In Person  Provider:   Carlyle Dolly, MD   Other Instructions Limit fluid intake to 1 litter per day   Thank you for choosing [facility]!   Two Gram Sodium Diet 2000 mg  What is Sodium? Sodium is a mineral found naturally in many foods. The most significant source of sodium in the diet is table salt, which is about 40% sodium.  Processed, convenience, and preserved foods also contain a large amount of sodium.  The body needs only 500 mg of sodium daily to function,  A normal diet provides more than enough sodium even if you do not use salt.  Why Limit Sodium? A build up of sodium in the body can cause thirst, increased blood pressure, shortness of breath, and water retention.  Decreasing sodium in the diet can reduce edema and risk of heart attack or stroke associated with high blood pressure.  Keep in mind that there are many other factors involved in these health problems.  Heredity, obesity, lack of exercise, cigarette smoking, stress and what you eat all play a role.  General Guidelines:  Do not add salt at the table or in cooking.  One teaspoon of salt contains over 2 grams of sodium.  Read food labels  Avoid processed and convenience foods  Ask your dietitian before eating any foods not dicussed in the menu planning guidelines  Consult your physician if you wish to use a salt substitute or a sodium containing medication such as antacids.  Limit milk and milk products to 16 oz (2 cups) per day.  Shopping Hints:  READ LABELS!! "Dietetic" does not  necessarily mean low sodium.  Salt and other sodium ingredients are often added to foods during processing.   Menu Planning Guidelines Food Group Choose More Often Avoid  Beverages (see also the milk group All fruit juices, low-sodium, salt-free vegetables juices, low-sodium carbonated beverages Regular vegetable or tomato juices, commercially softened water used for drinking or cooking  Breads and Cereals Enriched white, wheat, rye and pumpernickel bread, hard rolls and dinner rolls; muffins, cornbread and waffles; most dry cereals, cooked cereal without added salt; unsalted crackers and breadsticks; low sodium or homemade bread crumbs Bread, rolls and crackers with salted tops; quick breads; instant hot cereals; pancakes; commercial bread stuffing; self-rising flower and biscuit mixes; regular bread crumbs or cracker crumbs  Desserts and Sweets Desserts and sweets mad with mild should be within allowance Instant pudding mixes and cake mixes  Fats Butter or margarine; vegetable oils; unsalted salad dressings, regular salad dressings limited to 1 Tbs; light, sour and heavy cream Regular salad dressings containing bacon fat, bacon bits, and salt pork; snack dips made with instant soup mixes or processed cheese; salted nuts  Fruits Most fresh, frozen and canned fruits Fruits processed with salt or sodium-containing ingredient (some dried fruits are processed with sodium sulfites        Vegetables Fresh, frozen  vegetables and low- sodium canned vegetables Regular canned vegetables, sauerkraut, pickled vegetables, and others prepared in brine; frozen vegetables in sauces; vegetables seasoned with ham, bacon or salt pork  Condiments, Sauces, Miscellaneous  Salt substitute with physician's approval; pepper, herbs, spices; vinegar, lemon or lime juice; hot pepper sauce; garlic powder, onion powder, low sodium soy sauce (1 Tbs.); low sodium condiments (ketchup, chili sauce, mustard) in limited amounts (1  tsp.) fresh ground horseradish; unsalted tortilla chips, pretzels, potato chips, popcorn, salsa (1/4 cup) Any seasoning made with salt including garlic salt, celery salt, onion salt, and seasoned salt; sea salt, rock salt, kosher salt; meat tenderizers; monosodium glutamate; mustard, regular soy sauce, barbecue, sauce, chili sauce, teriyaki sauce, steak sauce, Worcestershire sauce, and most flavored vinegars; canned gravy and mixes; regular condiments; salted snack foods, olives, picles, relish, horseradish sauce, catsup   Food preparation: Try these seasonings Meats:    Pork Sage, onion Serve with applesauce  Chicken Poultry seasoning, thyme, parsley Serve with cranberry sauce  Lamb Curry powder, rosemary, garlic, thyme Serve with mint sauce or jelly  Veal Marjoram, basil Serve with current jelly, cranberry sauce  Beef Pepper, bay leaf Serve with dry mustard, unsalted chive butter  Fish [place] leaf, dill Serve with unsalted lemon butter, unsalted parsley butter  Vegetables:    Asparagus Lemon juice   Broccoli Lemon juice   Carrots Mustard dressing parsley, mint, nutmeg, glazed with unsalted butter and sugar   Green beans Marjoram, lemon juice, nutmeg,dill seed   Tomatoes Basil, marjoram, onion   Spice /blend for [employer]" 4 tsp ground thyme 1 tsp ground sage 3 tsp ground rosemary 4 tsp ground marjoram   Test your knowledge 1. A product that says "Salt Free" may still contain sodium. True or False 2. Garlic Powder and Hot Pepper Sauce an be used as alternative seasonings.True or False 3. Processed foods have more sodium than fresh foods.  True or False 4. Canned Vegetables have less sodium than froze True or False  WAYS TO DECREASE YOUR SODIUM INTAKE 1. Avoid the use of added salt in cooking and at the table.  Table salt (and other prepared seasonings which contain salt) is probably one of the greatest sources of sodium in the diet.  Unsalted foods can gain flavor from the sweet, sour,  and butter taste sensations of herbs and spices.  Instead of using salt for seasoning, try the following seasonings with the foods listed.  Remember: how you use them to enhance natural food flavors is limited only by your creativity... Allspice-Meat, fish, eggs, fruit, peas, red and yellow vegetables Almond Extract-Fruit baked goods Anise Seed-Sweet breads, fruit, carrots, beets, cottage cheese, cookies (tastes like licorice) Basil-Meat, fish, eggs, vegetables, rice, vegetables salads, soups, sauces Bay Leaf-Meat, fish, stews, poultry Burnet-Salad, vegetables (cucumber-like flavor) Caraway Seed-Bread, cookies, cottage cheese, meat, vegetables, cheese, rice Cardamon-Baked goods, fruit, soups Celery Powder or seed-Salads, salad dressings, sauces, meatloaf, soup, bread.Do not use  celery salt Chervil-Meats, salads, fish, eggs, vegetables, cottage cheese (parsley-like flavor) Chili Power-Meatloaf, chicken cheese, corn, eggplant, egg dishes Chives-Salads cottage cheese, egg dishes, soups, vegetables, sauces Cilantro-Salsa, casseroles Cinnamon-Baked goods, fruit, pork, lamb, chicken, carrots Cloves-Fruit, baked goods, fish, pot roast, green beans, beets, carrots Coriander-Pastry, cookies, meat, salads, cheese (lemon-orange flavor) Cumin-Meatloaf, fish,cheese, eggs, cabbage,fruit pie (caraway flavor) [employer], fruit, eggs, fish, poultry, cottage cheese, vegetables Dill Seed-Meat, cottage cheese, poultry, vegetables, fish, salads, bread Fennel Seed-Bread, cookies, apples, pork, eggs, fish, beets, cabbage, cheese, Licorice-like flavor Garlic-(buds or powder) Salads, meat, poultry,  fish, bread, butter, vegetables, potatoes.Do not  use garlic salt Ginger-Fruit, vegetables, baked goods, meat, fish, poultry Horseradish Root-Meet, vegetables, butter Lemon Juice or Extract-Vegetables, fruit, tea, baked goods, fish salads Mace-Baked goods fruit, vegetables, fish, poultry (taste like nutmeg) Maple  Extract-Syrups Marjoram-Meat, chicken, fish, vegetables, breads, green salads (taste like Sage) Mint-Tea, lamb, sherbet, vegetables, desserts, carrots, cabbage Mustard, Dry or Seed-Cheese, eggs, meats, vegetables, poultry Nutmeg-Baked goods, fruit, chicken, eggs, vegetables, desserts Onion Powder-Meat, fish, poultry, vegetables, cheese, eggs, bread, rice salads (Do not use   Onion salt) Orange Extract-Desserts, baked goods Oregano-Pasta, eggs, cheese, onions, pork, lamb, fish, chicken, vegetables, green salads Paprika-Meat, fish, poultry, eggs, cheese, vegetables Parsley Flakes-Butter, vegetables, meat fish, poultry, eggs, bread, salads (certain forms may   Contain sodium Pepper-Meat fish, poultry, vegetables, eggs Peppermint Extract-Desserts, baked goods Poppy Seed-Eggs, bread, cheese, fruit dressings, baked goods, noodles, vegetables, cottage  [employer], poultry, meat, fish, cauliflower, turnips,eggs bread Saffron-Rice, bread, veal, chicken, fish, eggs Sage-Meat, fish, poultry, onions, eggplant, tomateos, pork, stews Savory-Eggs, salads, poultry, meat, rice, vegetables, soups, pork Tarragon-Meat, poultry, fish, eggs, butter, vegetables (licorice-like flavor)  Thyme-Meat, poultry, fish, eggs, vegetables, (clover-like flavor), sauces, soups Tumeric-Salads, butter, eggs, fish, rice, vegetables (saffron-like flavor) Vanilla Extract-Baked goods, candy Vinegar-Salads, vegetables, meat marinades Walnut Extract-baked goods, candy  2. Choose your Foods Wisely   The following is a list of foods to avoid which are high in sodium:  Meats-Avoid all smoked, canned, salt cured, dried and kosher meat and fish as well as Anchovies   [employer] meats:Bologna, Liverwurst, Pastrami Canned meat or fish  Marinated herring Caviar    Pepperoni Corned Beef   Pizza Dried chipped beef  Salami Frozen breaded fish or meat Salt pork Frankfurters or hot  dogs  Sardines Gefilte fish   Sausage Ham (boiled ham, Proscuitto Smoked butt    spiced ham)   Spam      TV Dinners Vegetables Canned vegetables (Regular) Relish Canned mushrooms  Sauerkraut Olives    Tomato juice Pickles  Bakery and Dessert Products Canned puddings  Cream pies Cheesecake   Decorated cakes Cookies  Beverages/Juices Tomato juice, regular  Gatorade   V-8 vegetable juice, regular  Breads and Cereals Biscuit mixes   Salted potato chips, corn chips, pretzels Bread stuffing mixes  Salted crackers and rolls Pancake and waffle mixes Self-rising flour  Seasonings Accent    Meat sauces Barbecue sauce  Meat tenderizer Catsup    Monosodium glutamate (MSG) Celery salt   Onion salt Chili sauce   Prepared mustard Garlic salt   Salt, seasoned salt, sea salt Gravy mixes   Soy sauce Horseradish   Steak sauce Ketchup   Tartar sauce Lite salt    Teriyaki sauce Marinade mixes   Worcestershire sauce  Others Baking powder   Cocoa and cocoa mixes Baking soda   Commercial casserole mixes Candy-caramels, chocolate  Dehydrated soups    Bars, fudge,nougats  Instant rice and pasta mixes Canned broth or soup  Maraschino cherries Cheese, aged and processed cheese and cheese spreads  Learning Assessment Quiz  Indicated T (for True) or F (for False) for each of the following statements:  1. _____ Fresh fruits and vegetables and unprocessed grains are generally low in sodium 2. _____ Water may contain a considerable amount of sodium, depending on the source 3. _____ You can always tell if a food is high in sodium by tasting it 4. _____ Certain laxatives my be high in sodium and should be avoided  unless prescribed   by a physician or pharmacist 5. _____ Salt substitutes may be used freely by anyone on a sodium restricted diet 6. _____ Sodium is present in table salt, food additives and as a natural component of   most foods 7. _____ Table salt is approximately 90%  sodium 8. _____ Limiting sodium intake may help prevent excess fluid accumulation in the body 9. _____ On a sodium-restricted diet, seasonings such as bouillon soy sauce, and    cooking wine should be used in place of table salt 10. _____ On an ingredient list, a product which lists monosodium glutamate as the first   ingredient is an appropriate food to include on a low sodium diet  Circle the best answer(s) to the following statements (Hint: there may be more than one correct answer)  11. On a low-sodium diet, some acceptable snack items are:    A. Olives  F. Bean dip   K. Grapefruit juice    B. Salted Pretzels G. Commercial Popcorn   L. Canned peaches    C. Carrot Sticks  H. Bouillon   M. Unsalted nuts   D. [place] fries  I. Peanut butter crackers N. Salami   E. Sweet pickles J. Tomato Juice   O. Pizza  12.  Seasonings that may be used freely on a reduced - sodium diet include   A. Lemon wedges F.Monosodium glutamate K. Celery seed    B.Soysauce   G. Pepper   L. Mustard powder   C. Sea salt  H. Cooking wine  M. Onion flakes   D. Vinegar  E. Prepared horseradish N. Salsa   E. Sage   J. Worcestershire sauce  O. Chutney     Signed, Ermalinda Barrios, PA-C  09/29/2019 2:15 PM    [facility] [address], [place], [place]  [address] Phone: [phone]; Fax: [phone]

## 2019-09-25 ENCOUNTER — Other Ambulatory Visit: Payer: Self-pay

## 2019-09-25 ENCOUNTER — Ambulatory Visit (INDEPENDENT_AMBULATORY_CARE_PROVIDER_SITE_OTHER): Payer: Medicare Other | Admitting: *Deleted

## 2019-09-25 DIAGNOSIS — Z952 Presence of prosthetic heart valve: Secondary | ICD-10-CM | POA: Diagnosis not present

## 2019-09-25 DIAGNOSIS — I4891 Unspecified atrial fibrillation: Secondary | ICD-10-CM

## 2019-09-25 DIAGNOSIS — Z5181 Encounter for therapeutic drug level monitoring: Secondary | ICD-10-CM

## 2019-09-25 LAB — POCT INR: INR: 2.9 (ref 2.0–3.0)

## 2019-09-25 NOTE — Patient Instructions (Signed)
Continue taking 1 tablet daily Recheck in 4 weeks per pt request

## 2019-09-29 ENCOUNTER — Encounter: Payer: Self-pay | Admitting: Physician Assistant

## 2019-09-29 ENCOUNTER — Ambulatory Visit: Payer: Medicare Other | Admitting: Physician Assistant

## 2019-09-29 ENCOUNTER — Other Ambulatory Visit: Payer: Self-pay

## 2019-09-29 ENCOUNTER — Other Ambulatory Visit (HOSPITAL_COMMUNITY)
Admission: RE | Admit: 2019-09-29 | Discharge: 2019-09-29 | Disposition: A | Discharge status: Home / Self Care | Payer: Medicare Other | Source: Ambulatory Visit | Attending: Cardiology | Admitting: Cardiology

## 2019-09-29 VITALS — BP 92/52 | HR 67 | Ht 65.0 in | Wt 156.0 lb

## 2019-09-29 DIAGNOSIS — Z952 Presence of prosthetic heart valve: Secondary | ICD-10-CM

## 2019-09-29 DIAGNOSIS — E785 Hyperlipidemia, unspecified: Secondary | ICD-10-CM

## 2019-09-29 DIAGNOSIS — I4811 Longstanding persistent atrial fibrillation: Secondary | ICD-10-CM | POA: Diagnosis not present

## 2019-09-29 DIAGNOSIS — I5033 Acute on chronic diastolic (congestive) heart failure: Secondary | ICD-10-CM

## 2019-09-29 DIAGNOSIS — G4733 Obstructive sleep apnea (adult) (pediatric): Secondary | ICD-10-CM

## 2019-09-29 DIAGNOSIS — N183 Chronic kidney disease, stage 3 unspecified: Secondary | ICD-10-CM

## 2019-09-29 LAB — VITAMIN B12: Vitamin B-12: 3580 pg/mL — ABNORMAL HIGH (ref 180–914)

## 2019-09-29 LAB — COMPREHENSIVE METABOLIC PANEL
ALT: 10 U/L (ref 0–44)
AST: 25 U/L (ref 15–41)
Albumin: 2.6 g/dL — ABNORMAL LOW (ref 3.5–5.0)
Alkaline Phosphatase: 71 U/L (ref 38–126)
Anion gap: 11 (ref 5–15)
BUN: 54 mg/dL — ABNORMAL HIGH (ref 8–23)
CO2: 32 mmol/L (ref 22–32)
Calcium: 8.6 mg/dL — ABNORMAL LOW (ref 8.9–10.3)
Chloride: 95 mmol/L — ABNORMAL LOW (ref 98–111)
Creatinine, Ser: 2.18 mg/dL — ABNORMAL HIGH (ref 0.44–1.00)
GFR calc Af Amer: 24 mL/min — ABNORMAL LOW (ref >60)
GFR calc non Af Amer: 21 mL/min — ABNORMAL LOW (ref >60)
Glucose, Bld: 115 mg/dL — ABNORMAL HIGH (ref 70–99)
Potassium: 3.6 mmol/L (ref 3.5–5.1)
Sodium: 138 mmol/L (ref 135–145)
Total Bilirubin: 1.3 mg/dL — ABNORMAL HIGH (ref 0.3–1.2)
Total Protein: 7.9 g/dL (ref 6.5–8.1)

## 2019-09-29 LAB — CBC
HCT: 29.4 % — ABNORMAL LOW (ref 36.0–46.0)
Hemoglobin: 9.2 g/dL — ABNORMAL LOW (ref 12.0–15.0)
MCH: 35.8 pg — ABNORMAL HIGH (ref 26.0–34.0)
MCHC: 31.3 g/dL (ref 30.0–36.0)
MCV: 114.4 fL — ABNORMAL HIGH (ref 80.0–100.0)
Platelets: 135 10*3/uL — ABNORMAL LOW (ref 150–400)
RBC: 2.57 MIL/uL — ABNORMAL LOW (ref 3.87–5.11)
RDW: 16.1 % — ABNORMAL HIGH (ref 11.5–15.5)
WBC: 4 10*3/uL (ref 4.0–10.5)
nRBC: 0 % (ref 0.0–0.2)

## 2019-09-29 LAB — BRAIN NATRIURETIC PEPTIDE: B Natriuretic Peptide: 504 pg/mL — ABNORMAL HIGH (ref 0.0–100.0)

## 2019-09-29 LAB — TSH: TSH: 2.626 u[IU]/mL (ref 0.350–4.500)

## 2019-09-29 NOTE — Patient Instructions (Addendum)
Medication Instructions:  Your physician recommends that you continue on your current medications as directed. Please refer to the Current Medication list given to you today.  *If you need a refill on your cardiac medications before your next appointment, please call your pharmacy*   Lab Work: Your physician recommends that you return for lab work in: Today   If you have labs (blood work) drawn today and your tests are completely normal, you will receive your results only by: Marland Kitchen MyChart Message (if you have MyChart) OR . A paper copy in the mail If you have any lab test that is abnormal or we need to change your treatment, we will call you to review the results.   Testing/Procedures: NONE    Follow-Up: At [facility], you and your health needs are our priority.  As part of our continuing mission to provide you with exceptional heart care, we have created designated Provider Care Teams.  These Care Teams include your primary Cardiologist (physician) and Advanced Practice Providers (APPs -  Physician Assistants and Nurse Practitioners) who all work together to provide you with the care you need, when you need it.  We recommend signing up for the patient portal called "MyChart".  Sign up information is provided on this After Visit Summary.  MyChart is used to connect with patients for Virtual Visits (Telemedicine).  Patients are able to view lab/test results, encounter notes, upcoming appointments, etc.  Non-urgent messages can be sent to your provider as well.   To learn more about what you can do with MyChart, go to [email or web address].    Your next appointment:    Next Available   The format for your next appointment:   In Person  Provider:   Carlyle Dolly, MD   Other Instructions Limit fluid intake to 1 litter per day   Thank you for choosing [facility]!   Two Gram Sodium Diet 2000 mg  What is Sodium? Sodium is a mineral found naturally in many foods.  The most significant source of sodium in the diet is table salt, which is about 40% sodium.  Processed, convenience, and preserved foods also contain a large amount of sodium.  The body needs only 500 mg of sodium daily to function,  A normal diet provides more than enough sodium even if you do not use salt.  Why Limit Sodium? A build up of sodium in the body can cause thirst, increased blood pressure, shortness of breath, and water retention.  Decreasing sodium in the diet can reduce edema and risk of heart attack or stroke associated with high blood pressure.  Keep in mind that there are many other factors involved in these health problems.  Heredity, obesity, lack of exercise, cigarette smoking, stress and what you eat all play a role.  General Guidelines:  Do not add salt at the table or in cooking.  One teaspoon of salt contains over 2 grams of sodium.  Read food labels  Avoid processed and convenience foods  Ask your dietitian before eating any foods not dicussed in the menu planning guidelines  Consult your physician if you wish to use a salt substitute or a sodium containing medication such as antacids.  Limit milk and milk products to 16 oz (2 cups) per day.  Shopping Hints:  READ LABELS!! "Dietetic" does not necessarily mean low sodium.  Salt and other sodium ingredients are often added to foods during processing.   Menu Planning Guidelines Food Group Choose More Often Avoid  Beverages (see also the milk group All fruit juices, low-sodium, salt-free vegetables juices, low-sodium carbonated beverages Regular vegetable or tomato juices, commercially softened water used for drinking or cooking  Breads and Cereals Enriched white, wheat, rye and pumpernickel bread, hard rolls and dinner rolls; muffins, cornbread and waffles; most dry cereals, cooked cereal without added salt; unsalted crackers and breadsticks; low sodium or homemade bread crumbs Bread, rolls and crackers with salted  tops; quick breads; instant hot cereals; pancakes; commercial bread stuffing; self-rising flower and biscuit mixes; regular bread crumbs or cracker crumbs  Desserts and Sweets Desserts and sweets mad with mild should be within allowance Instant pudding mixes and cake mixes  Fats Butter or margarine; vegetable oils; unsalted salad dressings, regular salad dressings limited to 1 Tbs; light, sour and heavy cream Regular salad dressings containing bacon fat, bacon bits, and salt pork; snack dips made with instant soup mixes or processed cheese; salted nuts  Fruits Most fresh, frozen and canned fruits Fruits processed with salt or sodium-containing ingredient (some dried fruits are processed with sodium sulfites        Vegetables Fresh, frozen vegetables and low- sodium canned vegetables Regular canned vegetables, sauerkraut, pickled vegetables, and others prepared in brine; frozen vegetables in sauces; vegetables seasoned with ham, bacon or salt pork  Condiments, Sauces, Miscellaneous  Salt substitute with physician's approval; pepper, herbs, spices; vinegar, lemon or lime juice; hot pepper sauce; garlic powder, onion powder, low sodium soy sauce (1 Tbs.); low sodium condiments (ketchup, chili sauce, mustard) in limited amounts (1 tsp.) fresh ground horseradish; unsalted tortilla chips, pretzels, potato chips, popcorn, salsa (1/4 cup) Any seasoning made with salt including garlic salt, celery salt, onion salt, and seasoned salt; sea salt, rock salt, kosher salt; meat tenderizers; monosodium glutamate; mustard, regular soy sauce, barbecue, sauce, chili sauce, teriyaki sauce, steak sauce, Worcestershire sauce, and most flavored vinegars; canned gravy and mixes; regular condiments; salted snack foods, olives, picles, relish, horseradish sauce, catsup   Food preparation: Try these seasonings Meats:    Pork Sage, onion Serve with applesauce  Chicken Poultry seasoning, thyme, parsley Serve with cranberry  sauce  Lamb Curry powder, rosemary, garlic, thyme Serve with mint sauce or jelly  Veal Marjoram, basil Serve with current jelly, cranberry sauce  Beef Pepper, bay leaf Serve with dry mustard, unsalted chive butter  Fish [place] leaf, dill Serve with unsalted lemon butter, unsalted parsley butter  Vegetables:    Asparagus Lemon juice   Broccoli Lemon juice   Carrots Mustard dressing parsley, mint, nutmeg, glazed with unsalted butter and sugar   Green beans Marjoram, lemon juice, nutmeg,dill seed   Tomatoes Basil, marjoram, onion   Spice /blend for [employer]" 4 tsp ground thyme 1 tsp ground sage 3 tsp ground rosemary 4 tsp ground marjoram   Test your knowledge 1. A product that says "Salt Free" may still contain sodium. True or False 2. Garlic Powder and Hot Pepper Sauce an be used as alternative seasonings.True or False 3. Processed foods have more sodium than fresh foods.  True or False 4. Canned Vegetables have less sodium than froze True or False  WAYS TO DECREASE YOUR SODIUM INTAKE 1. Avoid the use of added salt in cooking and at the table.  Table salt (and other prepared seasonings which contain salt) is probably one of the greatest sources of sodium in the diet.  Unsalted foods can gain flavor from the sweet, sour, and butter taste sensations of herbs and spices.  Instead of using salt  for seasoning, try the following seasonings with the foods listed.  Remember: how you use them to enhance natural food flavors is limited only by your creativity... Allspice-Meat, fish, eggs, fruit, peas, red and yellow vegetables Almond Extract-Fruit baked goods Anise Seed-Sweet breads, fruit, carrots, beets, cottage cheese, cookies (tastes like licorice) Basil-Meat, fish, eggs, vegetables, rice, vegetables salads, soups, sauces Bay Leaf-Meat, fish, stews, poultry Burnet-Salad, vegetables (cucumber-like flavor) Caraway Seed-Bread, cookies, cottage cheese, meat, vegetables, cheese, rice Cardamon-Baked  goods, fruit, soups Celery Powder or seed-Salads, salad dressings, sauces, meatloaf, soup, bread.Do not use  celery salt Chervil-Meats, salads, fish, eggs, vegetables, cottage cheese (parsley-like flavor) Chili Power-Meatloaf, chicken cheese, corn, eggplant, egg dishes Chives-Salads cottage cheese, egg dishes, soups, vegetables, sauces Cilantro-Salsa, casseroles Cinnamon-Baked goods, fruit, pork, lamb, chicken, carrots Cloves-Fruit, baked goods, fish, pot roast, green beans, beets, carrots Coriander-Pastry, cookies, meat, salads, cheese (lemon-orange flavor) Cumin-Meatloaf, fish,cheese, eggs, cabbage,fruit pie (caraway flavor) [employer], fruit, eggs, fish, poultry, cottage cheese, vegetables Dill Seed-Meat, cottage cheese, poultry, vegetables, fish, salads, bread Fennel Seed-Bread, cookies, apples, pork, eggs, fish, beets, cabbage, cheese, Licorice-like flavor Garlic-(buds or powder) Salads, meat, poultry, fish, bread, butter, vegetables, potatoes.Do not  use garlic salt Ginger-Fruit, vegetables, baked goods, meat, fish, poultry Horseradish Root-Meet, vegetables, butter Lemon Juice or Extract-Vegetables, fruit, tea, baked goods, fish salads Mace-Baked goods fruit, vegetables, fish, poultry (taste like nutmeg) Maple Extract-Syrups Marjoram-Meat, chicken, fish, vegetables, breads, green salads (taste like Sage) Mint-Tea, lamb, sherbet, vegetables, desserts, carrots, cabbage Mustard, Dry or Seed-Cheese, eggs, meats, vegetables, poultry Nutmeg-Baked goods, fruit, chicken, eggs, vegetables, desserts Onion Powder-Meat, fish, poultry, vegetables, cheese, eggs, bread, rice salads (Do not use   Onion salt) Orange Extract-Desserts, baked goods Oregano-Pasta, eggs, cheese, onions, pork, lamb, fish, chicken, vegetables, green salads Paprika-Meat, fish, poultry, eggs, cheese, vegetables Parsley Flakes-Butter, vegetables, meat fish, poultry, eggs, bread, salads (certain forms may   Contain  sodium Pepper-Meat fish, poultry, vegetables, eggs Peppermint Extract-Desserts, baked goods Poppy Seed-Eggs, bread, cheese, fruit dressings, baked goods, noodles, vegetables, cottage  [employer], poultry, meat, fish, cauliflower, turnips,eggs bread Saffron-Rice, bread, veal, chicken, fish, eggs Sage-Meat, fish, poultry, onions, eggplant, tomateos, pork, stews Savory-Eggs, salads, poultry, meat, rice, vegetables, soups, pork Tarragon-Meat, poultry, fish, eggs, butter, vegetables (licorice-like flavor)  Thyme-Meat, poultry, fish, eggs, vegetables, (clover-like flavor), sauces, soups Tumeric-Salads, butter, eggs, fish, rice, vegetables (saffron-like flavor) Vanilla Extract-Baked goods, candy Vinegar-Salads, vegetables, meat marinades Walnut Extract-baked goods, candy  2. Choose your Foods Wisely   The following is a list of foods to avoid which are high in sodium:  Meats-Avoid all smoked, canned, salt cured, dried and kosher meat and fish as well as Anchovies   [employer] meats:Bologna, Liverwurst, Pastrami Canned meat or fish  Marinated herring Caviar    Pepperoni Corned Beef   Pizza Dried chipped beef  Salami Frozen breaded fish or meat Salt pork Frankfurters or hot dogs  Sardines Gefilte fish   Sausage Ham (boiled ham, Proscuitto Smoked butt    spiced ham)   Spam      TV Dinners Vegetables Canned vegetables (Regular) Relish Canned mushrooms  Sauerkraut Olives    Tomato juice Pickles  Bakery and Dessert Products Canned puddings  Cream pies Cheesecake   Decorated cakes Cookies  Beverages/Juices Tomato juice, regular  Gatorade   V-8 vegetable juice, regular  Breads and Cereals Biscuit mixes   Salted potato chips, corn chips, pretzels Bread stuffing mixes  Salted crackers and rolls Pancake and waffle mixes Self-rising flour  Seasonings Accent  Meat sauces Barbecue sauce  Meat tenderizer Catsup    Monosodium  glutamate (MSG) Celery salt   Onion salt Chili sauce   Prepared mustard Garlic salt   Salt, seasoned salt, sea salt Gravy mixes   Soy sauce Horseradish   Steak sauce Ketchup   Tartar sauce Lite salt    Teriyaki sauce Marinade mixes   Worcestershire sauce  Others Baking powder   Cocoa and cocoa mixes Baking soda   Commercial casserole mixes Candy-caramels, chocolate  Dehydrated soups    Bars, fudge,nougats  Instant rice and pasta mixes Canned broth or soup  Maraschino cherries Cheese, aged and processed cheese and cheese spreads  Learning Assessment Quiz  Indicated T (for True) or F (for False) for each of the following statements:  1. _____ Fresh fruits and vegetables and unprocessed grains are generally low in sodium 2. _____ Water may contain a considerable amount of sodium, depending on the source 3. _____ You can always tell if a food is high in sodium by tasting it 4. _____ Certain laxatives my be high in sodium and should be avoided unless prescribed   by a physician or pharmacist 5. _____ Salt substitutes may be used freely by anyone on a sodium restricted diet 6. _____ Sodium is present in table salt, food additives and as a natural component of   most foods 7. _____ Table salt is approximately 90% sodium 8. _____ Limiting sodium intake may help prevent excess fluid accumulation in the body 9. _____ On a sodium-restricted diet, seasonings such as bouillon soy sauce, and    cooking wine should be used in place of table salt 10. _____ On an ingredient list, a product which lists monosodium glutamate as the first   ingredient is an appropriate food to include on a low sodium diet  Circle the best answer(s) to the following statements (Hint: there may be more than one correct answer)  11. On a low-sodium diet, some acceptable snack items are:    A. Olives  F. Bean dip   K. Grapefruit juice    B. Salted Pretzels G. Commercial Popcorn   L. Canned peaches    C. Carrot  Sticks  H. Bouillon   M. Unsalted nuts   D. [place] fries  I. Peanut butter crackers N. Salami   E. Sweet pickles J. Tomato Juice   O. Pizza  12.  Seasonings that may be used freely on a reduced - sodium diet include   A. Lemon wedges F.Monosodium glutamate K. Celery seed    B.Soysauce   G. Pepper   L. Mustard powder   C. Sea salt  H. Cooking wine  M. Onion flakes   D. Vinegar  E. Prepared horseradish N. Salsa   E. Sage   J. Worcestershire sauce  O. Chutney

## 2019-09-30 NOTE — Progress Notes (Signed)
Please ask patient what Dr. Karie Kirks told her since he received these labs first. Kidney function is worsening on higher dose Zaroxolyn and increased diuretics. Her albumin is low which is contributing to her edema. Her one bilirubin is elevated. With her anasarca she needs a CT of her abdomen which patient says Dr. Karie Kirks was talking about ordering. We will let him work this up. She should make appt with renal who she saw once last year and keep f/u with Dr. Harl Bowie 7/27. Marland Kitchen She has significant valvular heart disease.Ultimately she may need to come to the hospital for full work up. I would suggest she go to [facility] hospital for evaluation by the heart failure and valve teams.

## 2019-10-05 ENCOUNTER — Other Ambulatory Visit: Payer: Self-pay | Admitting: Cardiology

## 2019-10-14 ENCOUNTER — Encounter: Payer: Self-pay | Admitting: Cardiology

## 2019-10-14 ENCOUNTER — Ambulatory Visit (INDEPENDENT_AMBULATORY_CARE_PROVIDER_SITE_OTHER): Payer: Medicare Other | Admitting: Cardiology

## 2019-10-14 ENCOUNTER — Emergency Department (HOSPITAL_COMMUNITY): Payer: Medicare Other

## 2019-10-14 ENCOUNTER — Encounter (HOSPITAL_COMMUNITY): Payer: Self-pay | Admitting: Emergency Medicine

## 2019-10-14 ENCOUNTER — Other Ambulatory Visit: Payer: Self-pay

## 2019-10-14 ENCOUNTER — Emergency Department (HOSPITAL_COMMUNITY)
Admission: EM | Admit: 2019-10-14 | Discharge: 2019-10-14 | Disposition: A | Discharge status: Home / Self Care | Payer: Medicare Other | Attending: Emergency Medicine | Admitting: Emergency Medicine

## 2019-10-14 VITALS — BP 113/69 | HR 69 | Ht 65.0 in | Wt 160.0 lb

## 2019-10-14 DIAGNOSIS — R0602 Shortness of breath: Secondary | ICD-10-CM | POA: Insufficient documentation

## 2019-10-14 DIAGNOSIS — I5033 Acute on chronic diastolic (congestive) heart failure: Secondary | ICD-10-CM

## 2019-10-14 DIAGNOSIS — I50810 Right heart failure, unspecified: Secondary | ICD-10-CM

## 2019-10-14 DIAGNOSIS — Z5321 Procedure and treatment not carried out due to patient leaving prior to being seen by health care provider: Secondary | ICD-10-CM | POA: Insufficient documentation

## 2019-10-14 DIAGNOSIS — R2243 Localized swelling, mass and lump, lower limb, bilateral: Secondary | ICD-10-CM | POA: Diagnosis not present

## 2019-10-14 NOTE — ED Notes (Signed)
Pt stated she was tired of waiting and was going home.

## 2019-10-14 NOTE — Patient Instructions (Addendum)
Medication Instructions:   Your physician recommends that you continue on your current medications as directed. Please refer to the Current Medication list given to you today.  Labwork:  NONE  Testing/Procedures:  NONE  Follow-Up:  Your physician recommends that you go to the ED now.   Any Other Special Instructions Will Be Listed Below (If Applicable).  If you need a refill on your cardiac medications before your next appointment, please call your pharmacy.

## 2019-10-14 NOTE — ED Triage Notes (Signed)
Pt reports was sent from PCP office regarding shortness of breath, increase in bilateral leg swelling. Pt denies chest pain.

## 2019-10-14 NOTE — Progress Notes (Signed)
Clinical Summary Mary [name] is a 79 y.o.female seen today for follow up of the following medical problems.   1. 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 09/2019 weight 156 lbs  - taking torsemide 80mg  bid, metolazone 10mg  based on weight. Over the last 5 days has had x 3 doses. Over that 5 days weight still up 5 lbs.  - generalized fatigue. +SOB. Increasing LE edema, but more so abdominal distension.     2. Afib - recent symptoms at night, can last all night long. Feeling of her heart trying to jump out of her chest. Jan 2020 monitor showed episodes of afib - has already been on coumadin due to history of AVR.  - no recent symptoms.     3. Orthostatic dizziness   4. Pleural effusion/SOB - followed by pulmonary - we tried increasing her lasix to 60mg  dailyback in 4/2018however she developed AKI(Cr 0.8 to 1.28) last Cr down to 1., and dose was decreased back to 40mg  daily.  01/16/17 large right pleural effusion  - thoracentesis 01/25/17 with 1.2 liters removed. Cultures negative, gram stain +PMNs, pH 7.6, cytology reactive mesothelial cells - pleural LDH 112 - pleural protein: 3.6 - I do not have the serum LDH and totral protein numbers available to me -Repeat CXR 02/02/17 showed reaccumulating effusion - 10/2015 echo LVEF 55-60%, normal AVR    5. 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    6. HTN -compliant with meds   7. OSA - noncompliant with cpap  8. History of CVA - followed by pcp  10. Cirrhosis -  noted on prior CT imaging. I do not see GI notes in epic. PCP notes mention history of cirrhosis in problem list.   Past Medical History:  Diagnosis Date  . Anxiety   . Diabetes mellitus with neuropathy ([place])   . Diabetic neuropathy ([place])   . DJD (degenerative joint disease)   . H/O aortic valve replacement   . Hyperlipidemia   . Hypertension   . Neuromuscular disorder (HCC)    neuropathy in feet  . Obesity   . Sleep apnea   . Stroke [facility])    " light stroke"  . Wears glasses      Allergies  Allergen Reactions  . Tape Itching    Redness, Please use "paper" tape only     Current Outpatient Medications  Medication Sig Dispense Refill  . acetaminophen (TYLENOL) 500 MG tablet Take 500 mg by mouth 2 (two) times daily as needed for mild pain or moderate pain.     Marland Kitchen ALPRAZolam (XANAX) 0.5 MG tablet Take 0.5 mg by mouth at bedtime as needed for sleep.     . bisacodyl (DULCOLAX) 5 MG EC tablet Take 5 mg by mouth daily as needed for moderate constipation.    . cetirizine (ZYRTEC) 10 MG tablet Take 10 mg by mouth at bedtime as needed for allergies.   11  . fluticasone (FLONASE) 50 MCG/ACT nasal spray Place 1 spray into both nostrils at  bedtime.   11  . HYDROcodone-acetaminophen (NORCO) 10-325 MG tablet Take 1 tablet by mouth 2 (two) times daily.   0  . metolazone (ZAROXOLYN) 10 MG tablet Take 0.5 tablets (5 mg total) by mouth daily as needed (if weight increases by >2lbs).    . metoprolol succinate (TOPROL-XL) 100 MG 24 hr tablet TAKE 1 TABLET BY MOUTH ONCE DAILY WITH MEALS OR  IMMEDIATELY  FOLLOWING 90 tablet 1  . metoprolol succinate (TOPROL-XL) 50 MG 24 hr tablet Take 2 tablets (100 mg total) by mouth every morning. Take with or immediately following a meal. 60 tablet 0  . pantoprazole (PROTONIX) 40 MG tablet Take 1 tablet (40 mg total) by mouth daily before breakfast. 30 tablet 5  . polyethylene glycol powder (GLYCOLAX/MIRALAX) 17 GM/SCOOP powder Take 17 g by mouth daily.    .  potassium chloride (KLOR-CON) 10 MEQ tablet Take 10 mEq by mouth every morning.    . simvastatin (ZOCOR) 40 MG tablet Take 40 mg by mouth daily at 8 pm.     . torsemide (DEMADEX) 20 MG tablet Take 2 tablets (40 mg total) by mouth 2 (two) times daily. 360 tablet 3  . Vitamin D, Ergocalciferol, (DRISDOL) 1.25 MG (50000 UT) CAPS capsule Take 50,000 Units by mouth every Sunday.     . warfarin (COUMADIN) 2 MG tablet Take 1 tablet daily or as directed by Coumadin clinic 100 tablet 1   No current facility-administered medications for this visit.     Past Surgical History:  Procedure Laterality Date  . ANKLE SURGERY     Left tendon repair  . AORTIC VALVE REPLACEMENT  03/1994  . CARDIAC CATHETERIZATION     19 96 [facility]  . CERVICAL SPINE SURGERY    . COLONOSCOPY    . HEMORRHOID SURGERY    . KNEE ARTHROSCOPY Right 04/13/2015   Procedure: RIGHT KNEE ARTHROSCOPY WITH DEBRIDEMENT AND PARTIAL MEDIAL MENISCECTOMY;  Surgeon: Mcarthur Rossetti, MD;  Location: [place];  Service: Orthopedics;  Laterality: Right;  . KNEE ARTHROSCOPY W/ MENISCECTOMY Right 04/13/2015  . LUMBAR FUSION       Allergies  Allergen Reactions  . Tape Itching    Redness, Please use "paper" tape only      Family History  Problem Relation Age of Onset  . Pneumonia Sister   . Dementia Mother   . Diabetes Sister      Social History Mary [name] reports that she has never smoked. She has never used smokeless tobacco. Mary [name] reports no history of alcohol use.   Review of Systems CONSTITUTIONAL: +_fatigue HEENT: Eyes: No visual loss, blurred vision, double vision or yellow sclerae.No hearing loss, sneezing, congestion, runny nose or sore throat.  SKIN: No rash or itching.  CARDIOVASCULAR: per hpi RESPIRATORY: No shortness of breath, cough or sputum.  GASTROINTESTINAL: No anorexia, nausea, vomiting or diarrhea. No abdominal pain or blood.  GENITOURINARY: No burning on urination, no polyuria NEUROLOGICAL: No  headache, dizziness, syncope, paralysis, ataxia, numbness or tingling in the extremities. No change in bowel or bladder control.  MUSCULOSKELETAL: No muscle, back pain, joint pain or stiffness.  LYMPHATICS: No enlarged nodes. No history of splenectomy.  PSYCHIATRIC: No history of depression or anxiety.  ENDOCRINOLOGIC: No reports of sweating, cold or heat intolerance. No polyuria or polydipsia.  Marland Kitchen   Physical Examination Today's Vitals   10/14/19 1345  BP: 113/69  Pulse: 69  SpO2: 96%  Weight: 160 lb (72.6 kg)  Height: 5\' 5"  (1.651 m)  Body mass index is 26.63 kg/m.  Gen: resting comfortably, no acute distress HEENT: no scleral icterus, pupils equal round and reactive, no palptable cervical adenopathy,  CV: RRR, 3/6 systolic murmur LLSB, elevated JVD Resp: bilateral crackles GI: distended MSK: extremities are warm, 1+ bilateral LE edema  Skin: warm, no rash Neuro:  no focal deficits Psych: appropriate affect   Diagnostic Studies  10/2015 echo Study Conclusions  - Left ventricle: The cavity size was normal. Wall thickness was increased in a pattern of moderate LVH. Systolic function was normal. The estimated ejection fraction was in the range of 55% to 60%. The study is not technically sufficient to allow evaluation of LV diastolic function. - Aortic valve: Medical record indicated St Jude mechanical valve in the AV position, unknown size. There was no stenosis. There was mild regurgitation. - Mitral valve: Suspect possible mitral anular ring based on appearance, but cannot confirm. The valve surgery records are not available in the medical record. Moderately calcified annulus. Mildly thickened leaflets . There was mild regurgitation. - Left atrium: The atrium was moderately dilated. - Right atrium: The atrium was moderately dilated. - Atrial septum: No defect or patent foramen ovale was identified. - Tricuspid valve: There was moderate  regurgitation. - Pulmonary arteries: Systolic pressure was mildly increased. PA peak pressure: 38 mm Hg (S). - Technically adequate study.   07/2017 nuclear stress  Defect 1: There is a medium defect of mild severity present in the mid inferolateral and apical lateral location. This appears to be due to soft tissue attenuation. No ischemic zones.  This is a low risk study.  Nuclear stress EF: 63%.  LBBB present throughout study.  Jan 2020 Event monitor  7 day event monitor  Min HR 55, Max HR 110, Avg HR 70  Episodes of rate controlled atrial fibrillation are noted  No symptoms reported   10/2018 heart monitor 7 days  7 day event monitor  Min HR 55, Max HR 119, Avg HR 71  No symptoms reported  Tracings show she is in afib throughout the study, rate controlled   Assessment and Plan   1. Acute on chronic diastolic HF complicated by pulmonary HTN and RV dysfunction, cirrhosis by imaging  - progressing LE edema but more so abdominal distension despite taking torsemide 80mg  bid and metoalzone 10mg , 5 lbs weight gain 155 to 160 despite this regimen. Worsening renal function from last labs - volume appears to be multifactorial with diastolic HF, RV failure, and cirrhosis - I have recommended admission for IV diuresis, repeat echo   2. Cirrhosis - unclear history to me, noted on imaging over the years. I do not see details about the diagnosis or a prior GI evaluation in epic.  - significant ascties on exam - unclear how much is playing a role in her volume overload, AKI. May require GI evaluation during admission.   3. AKI - last labs showed significant elevation in Cr, has been on very strong doses of diuretics at home however despite this having progressing weight gain, fluid retention - recheck echo, perhaps worsening of cardiac function including RV failure   I have recommended hospital evaluation due to complex heart failure, cirrhosis, and AKI that is  failing management at home. Patient had pcp appt today and will discuss with them and decide if they will present to ER.      Arnoldo Lenis, M.D.

## 2019-10-20 ENCOUNTER — Other Ambulatory Visit (HOSPITAL_COMMUNITY)
Admission: RE | Admit: 2019-10-20 | Discharge: 2019-10-20 | Disposition: A | Discharge status: Home / Self Care | Payer: Medicare Other | Source: Ambulatory Visit | Attending: Family Medicine | Admitting: Family Medicine

## 2019-10-20 DIAGNOSIS — I5032 Chronic diastolic (congestive) heart failure: Secondary | ICD-10-CM | POA: Insufficient documentation

## 2019-10-20 DIAGNOSIS — Z952 Presence of prosthetic heart valve: Secondary | ICD-10-CM | POA: Insufficient documentation

## 2019-10-20 DIAGNOSIS — K7460 Unspecified cirrhosis of liver: Secondary | ICD-10-CM | POA: Insufficient documentation

## 2019-10-20 DIAGNOSIS — I130 Hypertensive heart and chronic kidney disease with heart failure and stage 1 through stage 4 chronic kidney disease, or unspecified chronic kidney disease: Secondary | ICD-10-CM | POA: Diagnosis not present

## 2019-10-20 DIAGNOSIS — R0602 Shortness of breath: Secondary | ICD-10-CM | POA: Diagnosis not present

## 2019-10-20 LAB — CBC WITH DIFFERENTIAL/PLATELET
Abs Immature Granulocytes: 0.01 10*3/uL (ref 0.00–0.07)
Basophils Absolute: 0 10*3/uL (ref 0.0–0.1)
Basophils Relative: 1 %
Eosinophils Absolute: 0.4 10*3/uL (ref 0.0–0.5)
Eosinophils Relative: 9 %
HCT: 29.9 % — ABNORMAL LOW (ref 36.0–46.0)
Hemoglobin: 9.4 g/dL — ABNORMAL LOW (ref 12.0–15.0)
Immature Granulocytes: 0 %
Lymphocytes Relative: 15 %
Lymphs Abs: 0.6 10*3/uL — ABNORMAL LOW (ref 0.7–4.0)
MCH: 35.7 pg — ABNORMAL HIGH (ref 26.0–34.0)
MCHC: 31.4 g/dL (ref 30.0–36.0)
MCV: 113.7 fL — ABNORMAL HIGH (ref 80.0–100.0)
Monocytes Absolute: 0.5 10*3/uL (ref 0.1–1.0)
Monocytes Relative: 12 %
Neutro Abs: 2.7 10*3/uL (ref 1.7–7.7)
Neutrophils Relative %: 63 %
Platelets: 143 10*3/uL — ABNORMAL LOW (ref 150–400)
RBC: 2.63 MIL/uL — ABNORMAL LOW (ref 3.87–5.11)
RDW: 16.5 % — ABNORMAL HIGH (ref 11.5–15.5)
WBC: 4.2 10*3/uL (ref 4.0–10.5)
nRBC: 0 % (ref 0.0–0.2)

## 2019-10-20 LAB — COMPREHENSIVE METABOLIC PANEL
ALT: 9 U/L (ref 0–44)
AST: 24 U/L (ref 15–41)
Albumin: 2.6 g/dL — ABNORMAL LOW (ref 3.5–5.0)
Alkaline Phosphatase: 67 U/L (ref 38–126)
Anion gap: 12 (ref 5–15)
BUN: 60 mg/dL — ABNORMAL HIGH (ref 8–23)
CO2: 31 mmol/L (ref 22–32)
Calcium: 8.6 mg/dL — ABNORMAL LOW (ref 8.9–10.3)
Chloride: 92 mmol/L — ABNORMAL LOW (ref 98–111)
Creatinine, Ser: 2.37 mg/dL — ABNORMAL HIGH (ref 0.44–1.00)
GFR calc Af Amer: 22 mL/min — ABNORMAL LOW (ref >60)
GFR calc non Af Amer: 19 mL/min — ABNORMAL LOW (ref >60)
Glucose, Bld: 145 mg/dL — ABNORMAL HIGH (ref 70–99)
Potassium: 3.2 mmol/L — ABNORMAL LOW (ref 3.5–5.1)
Sodium: 135 mmol/L (ref 135–145)
Total Bilirubin: 1.5 mg/dL — ABNORMAL HIGH (ref 0.3–1.2)
Total Protein: 8 g/dL (ref 6.5–8.1)

## 2019-10-23 ENCOUNTER — Inpatient Hospital Stay (HOSPITAL_COMMUNITY)
Admission: EM | Admit: 2019-10-23 | Discharge: 2019-11-02 | DRG: 286 | Disposition: A | Discharge status: Home / Self Care | Payer: Medicare Other | Attending: Internal Medicine | Admitting: Internal Medicine

## 2019-10-23 ENCOUNTER — Other Ambulatory Visit: Payer: Self-pay

## 2019-10-23 ENCOUNTER — Observation Stay (HOSPITAL_BASED_OUTPATIENT_CLINIC_OR_DEPARTMENT_OTHER): Payer: Medicare Other

## 2019-10-23 ENCOUNTER — Emergency Department (HOSPITAL_COMMUNITY): Payer: Medicare Other

## 2019-10-23 ENCOUNTER — Encounter (HOSPITAL_COMMUNITY): Payer: Self-pay | Admitting: Emergency Medicine

## 2019-10-23 ENCOUNTER — Ambulatory Visit (INDEPENDENT_AMBULATORY_CARE_PROVIDER_SITE_OTHER): Payer: Medicare Other | Admitting: Cardiology

## 2019-10-23 VITALS — BP 110/58 | HR 72 | Ht 65.0 in | Wt 162.0 lb

## 2019-10-23 DIAGNOSIS — I50811 Acute right heart failure: Secondary | ICD-10-CM | POA: Diagnosis not present

## 2019-10-23 DIAGNOSIS — I5041 Acute combined systolic (congestive) and diastolic (congestive) heart failure: Secondary | ICD-10-CM | POA: Diagnosis not present

## 2019-10-23 DIAGNOSIS — G4733 Obstructive sleep apnea (adult) (pediatric): Secondary | ICD-10-CM | POA: Diagnosis present

## 2019-10-23 DIAGNOSIS — I10 Essential (primary) hypertension: Secondary | ICD-10-CM | POA: Diagnosis not present

## 2019-10-23 DIAGNOSIS — E1169 Type 2 diabetes mellitus with other specified complication: Secondary | ICD-10-CM | POA: Diagnosis not present

## 2019-10-23 DIAGNOSIS — R140 Abdominal distension (gaseous): Secondary | ICD-10-CM | POA: Diagnosis not present

## 2019-10-23 DIAGNOSIS — I5043 Acute on chronic combined systolic (congestive) and diastolic (congestive) heart failure: Secondary | ICD-10-CM | POA: Diagnosis present

## 2019-10-23 DIAGNOSIS — Z8673 Personal history of transient ischemic attack (TIA), and cerebral infarction without residual deficits: Secondary | ICD-10-CM

## 2019-10-23 DIAGNOSIS — N184 Chronic kidney disease, stage 4 (severe): Secondary | ICD-10-CM | POA: Diagnosis present

## 2019-10-23 DIAGNOSIS — I130 Hypertensive heart and chronic kidney disease with heart failure and stage 1 through stage 4 chronic kidney disease, or unspecified chronic kidney disease: Principal | ICD-10-CM | POA: Diagnosis present

## 2019-10-23 DIAGNOSIS — R0602 Shortness of breath: Secondary | ICD-10-CM | POA: Diagnosis not present

## 2019-10-23 DIAGNOSIS — R188 Other ascites: Secondary | ICD-10-CM | POA: Diagnosis present

## 2019-10-23 DIAGNOSIS — I50813 Acute on chronic right heart failure: Secondary | ICD-10-CM | POA: Diagnosis not present

## 2019-10-23 DIAGNOSIS — I5033 Acute on chronic diastolic (congestive) heart failure: Secondary | ICD-10-CM

## 2019-10-23 DIAGNOSIS — I340 Nonrheumatic mitral (valve) insufficiency: Secondary | ICD-10-CM

## 2019-10-23 DIAGNOSIS — I361 Nonrheumatic tricuspid (valve) insufficiency: Secondary | ICD-10-CM | POA: Diagnosis not present

## 2019-10-23 DIAGNOSIS — I2729 Other secondary pulmonary hypertension: Secondary | ICD-10-CM | POA: Diagnosis present

## 2019-10-23 DIAGNOSIS — Z981 Arthrodesis status: Secondary | ICD-10-CM | POA: Diagnosis not present

## 2019-10-23 DIAGNOSIS — E669 Obesity, unspecified: Secondary | ICD-10-CM | POA: Diagnosis present

## 2019-10-23 DIAGNOSIS — I4821 Permanent atrial fibrillation: Secondary | ICD-10-CM | POA: Diagnosis present

## 2019-10-23 DIAGNOSIS — Z20822 Contact with and (suspected) exposure to covid-19: Secondary | ICD-10-CM | POA: Diagnosis present

## 2019-10-23 DIAGNOSIS — I639 Cerebral infarction, unspecified: Secondary | ICD-10-CM | POA: Diagnosis not present

## 2019-10-23 DIAGNOSIS — N179 Acute kidney failure, unspecified: Secondary | ICD-10-CM | POA: Diagnosis present

## 2019-10-23 DIAGNOSIS — Z952 Presence of prosthetic heart valve: Secondary | ICD-10-CM

## 2019-10-23 DIAGNOSIS — D631 Anemia in chronic kidney disease: Secondary | ICD-10-CM | POA: Diagnosis present

## 2019-10-23 DIAGNOSIS — J90 Pleural effusion, not elsewhere classified: Secondary | ICD-10-CM | POA: Diagnosis present

## 2019-10-23 DIAGNOSIS — Z9119 Patient's noncompliance with other medical treatment and regimen: Secondary | ICD-10-CM

## 2019-10-23 DIAGNOSIS — M1990 Unspecified osteoarthritis, unspecified site: Secondary | ICD-10-CM | POA: Diagnosis present

## 2019-10-23 DIAGNOSIS — E785 Hyperlipidemia, unspecified: Secondary | ICD-10-CM | POA: Diagnosis present

## 2019-10-23 DIAGNOSIS — Z794 Long term (current) use of insulin: Secondary | ICD-10-CM

## 2019-10-23 DIAGNOSIS — I509 Heart failure, unspecified: Secondary | ICD-10-CM

## 2019-10-23 DIAGNOSIS — I50810 Right heart failure, unspecified: Secondary | ICD-10-CM | POA: Diagnosis not present

## 2019-10-23 DIAGNOSIS — N183 Chronic kidney disease, stage 3 unspecified: Secondary | ICD-10-CM | POA: Diagnosis present

## 2019-10-23 DIAGNOSIS — K7460 Unspecified cirrhosis of liver: Secondary | ICD-10-CM | POA: Diagnosis present

## 2019-10-23 DIAGNOSIS — E1122 Type 2 diabetes mellitus with diabetic chronic kidney disease: Secondary | ICD-10-CM | POA: Diagnosis present

## 2019-10-23 DIAGNOSIS — N1832 Chronic kidney disease, stage 3b: Secondary | ICD-10-CM

## 2019-10-23 DIAGNOSIS — E1140 Type 2 diabetes mellitus with diabetic neuropathy, unspecified: Secondary | ICD-10-CM | POA: Diagnosis present

## 2019-10-23 DIAGNOSIS — F419 Anxiety disorder, unspecified: Secondary | ICD-10-CM | POA: Diagnosis present

## 2019-10-23 DIAGNOSIS — D539 Nutritional anemia, unspecified: Secondary | ICD-10-CM | POA: Diagnosis present

## 2019-10-23 DIAGNOSIS — Z888 Allergy status to other drugs, medicaments and biological substances status: Secondary | ICD-10-CM

## 2019-10-23 DIAGNOSIS — I272 Pulmonary hypertension, unspecified: Secondary | ICD-10-CM | POA: Diagnosis not present

## 2019-10-23 DIAGNOSIS — E119 Type 2 diabetes mellitus without complications: Secondary | ICD-10-CM

## 2019-10-23 DIAGNOSIS — R634 Abnormal weight loss: Secondary | ICD-10-CM | POA: Diagnosis present

## 2019-10-23 DIAGNOSIS — I5082 Biventricular heart failure: Secondary | ICD-10-CM | POA: Diagnosis present

## 2019-10-23 DIAGNOSIS — Z7901 Long term (current) use of anticoagulants: Secondary | ICD-10-CM

## 2019-10-23 DIAGNOSIS — Z79899 Other long term (current) drug therapy: Secondary | ICD-10-CM

## 2019-10-23 DIAGNOSIS — R7989 Other specified abnormal findings of blood chemistry: Secondary | ICD-10-CM | POA: Diagnosis present

## 2019-10-23 LAB — CBC
HCT: 30.8 % — ABNORMAL LOW (ref 36.0–46.0)
Hemoglobin: 9.5 g/dL — ABNORMAL LOW (ref 12.0–15.0)
MCH: 35.2 pg — ABNORMAL HIGH (ref 26.0–34.0)
MCHC: 30.8 g/dL (ref 30.0–36.0)
MCV: 114.1 fL — ABNORMAL HIGH (ref 80.0–100.0)
Platelets: 145 10*3/uL — ABNORMAL LOW (ref 150–400)
RBC: 2.7 MIL/uL — ABNORMAL LOW (ref 3.87–5.11)
RDW: 16.4 % — ABNORMAL HIGH (ref 11.5–15.5)
WBC: 4.6 10*3/uL (ref 4.0–10.5)
nRBC: 0 % (ref 0.0–0.2)

## 2019-10-23 LAB — AMMONIA: Ammonia: 53 umol/L — ABNORMAL HIGH (ref 9–35)

## 2019-10-23 LAB — SARS CORONAVIRUS 2 BY RT PCR (HOSPITAL ORDER, PERFORMED IN ~~LOC~~ HOSPITAL LAB): SARS Coronavirus 2: NEGATIVE

## 2019-10-23 LAB — BASIC METABOLIC PANEL
Anion gap: 14 (ref 5–15)
BUN: 61 mg/dL — ABNORMAL HIGH (ref 8–23)
CO2: 30 mmol/L (ref 22–32)
Calcium: 8.7 mg/dL — ABNORMAL LOW (ref 8.9–10.3)
Chloride: 92 mmol/L — ABNORMAL LOW (ref 98–111)
Creatinine, Ser: 2.2 mg/dL — ABNORMAL HIGH (ref 0.44–1.00)
GFR calc Af Amer: 24 mL/min — ABNORMAL LOW (ref >60)
GFR calc non Af Amer: 20 mL/min — ABNORMAL LOW (ref >60)
Glucose, Bld: 112 mg/dL — ABNORMAL HIGH (ref 70–99)
Potassium: 3.4 mmol/L — ABNORMAL LOW (ref 3.5–5.1)
Sodium: 136 mmol/L (ref 135–145)

## 2019-10-23 LAB — ECHOCARDIOGRAM COMPLETE
AV Mean grad: 3.8 mmHg
AV Peak grad: 6.4 mmHg
Ao pk vel: 1.27 m/s
Area-P 1/2: 3.33 cm2
Height: 66 in
S' Lateral: 3.5 cm
Weight: 2563.2 oz

## 2019-10-23 LAB — GLUCOSE, CAPILLARY: Glucose-Capillary: 130 mg/dL — ABNORMAL HIGH (ref 70–99)

## 2019-10-23 LAB — PROTIME-INR
INR: 3.2 — ABNORMAL HIGH (ref 0.8–1.2)
Prothrombin Time: 31.7 seconds — ABNORMAL HIGH (ref 11.4–15.2)

## 2019-10-23 LAB — BRAIN NATRIURETIC PEPTIDE: B Natriuretic Peptide: 565 pg/mL — ABNORMAL HIGH (ref 0.0–100.0)

## 2019-10-23 LAB — MAGNESIUM: Magnesium: 2.5 mg/dL — ABNORMAL HIGH (ref 1.7–2.4)

## 2019-10-23 MED ORDER — SIMVASTATIN 20 MG PO TABS
40.0000 mg | ORAL_TABLET | Freq: Every day | ORAL | Status: DC
  Administered 2019-10-23 – 2019-11-01 (×10): 40 mg via ORAL
  Filled 2019-10-23 (×10): qty 2

## 2019-10-23 MED ORDER — FUROSEMIDE 10 MG/ML IJ SOLN
80.0000 mg | Freq: Two times a day (BID) | INTRAMUSCULAR | Status: DC
  Administered 2019-10-24 – 2019-10-28 (×7): 80 mg via INTRAVENOUS
  Filled 2019-10-23 (×8): qty 8

## 2019-10-23 MED ORDER — POLYETHYLENE GLYCOL 3350 17 GM/SCOOP PO POWD
17.0000 g | Freq: Every day | ORAL | Status: DC
  Filled 2019-10-23: qty 255

## 2019-10-23 MED ORDER — TRAZODONE HCL 50 MG PO TABS
50.0000 mg | ORAL_TABLET | Freq: Every evening | ORAL | Status: DC | PRN
  Administered 2019-10-27 – 2019-10-28 (×2): 50 mg via ORAL
  Filled 2019-10-23 (×3): qty 1

## 2019-10-23 MED ORDER — LORATADINE 10 MG PO TABS
10.0000 mg | ORAL_TABLET | Freq: Every day | ORAL | Status: DC
  Administered 2019-10-23 – 2019-11-02 (×11): 10 mg via ORAL
  Filled 2019-10-23 (×11): qty 1

## 2019-10-23 MED ORDER — POTASSIUM CHLORIDE CRYS ER 20 MEQ PO TBCR
40.0000 meq | EXTENDED_RELEASE_TABLET | Freq: Once | ORAL | Status: AC
  Administered 2019-10-23: 40 meq via ORAL
  Filled 2019-10-23: qty 2

## 2019-10-23 MED ORDER — SODIUM CHLORIDE 0.9% FLUSH
3.0000 mL | Freq: Two times a day (BID) | INTRAVENOUS | Status: DC
  Administered 2019-10-23 – 2019-11-02 (×17): 3 mL via INTRAVENOUS

## 2019-10-23 MED ORDER — POLYETHYLENE GLYCOL 3350 17 G PO PACK: 17.0000 g | PACK | Freq: Every day | ORAL | Status: DC | PRN

## 2019-10-23 MED ORDER — VITAMIN D (ERGOCALCIFEROL) 1.25 MG (50000 UNIT) PO CAPS
50000.0000 [IU] | ORAL_CAPSULE | ORAL | Status: DC
  Administered 2019-10-26 – 2019-11-02 (×4): 50000 [IU] via ORAL
  Filled 2019-10-23 (×4): qty 1

## 2019-10-23 MED ORDER — METOPROLOL SUCCINATE ER 50 MG PO TB24
100.0000 mg | ORAL_TABLET | Freq: Every morning | ORAL | Status: DC
  Administered 2019-10-24 – 2019-10-26 (×3): 100 mg via ORAL
  Filled 2019-10-23 (×4): qty 2

## 2019-10-23 MED ORDER — ONDANSETRON HCL 4 MG PO TABS: 4.0000 mg | ORAL_TABLET | Freq: Four times a day (QID) | ORAL | Status: DC | PRN

## 2019-10-23 MED ORDER — HYDROCODONE-ACETAMINOPHEN 10-325 MG PO TABS
1.0000 | ORAL_TABLET | Freq: Two times a day (BID) | ORAL | Status: DC
  Administered 2019-10-23 – 2019-11-02 (×20): 1 via ORAL
  Filled 2019-10-23 (×20): qty 1

## 2019-10-23 MED ORDER — VITAMIN B-12 1000 MCG PO TABS
500.0000 ug | ORAL_TABLET | Freq: Every day | ORAL | Status: DC
  Administered 2019-10-24 – 2019-11-02 (×10): 500 ug via ORAL
  Filled 2019-10-23: qty 1
  Filled 2019-10-23 (×2): qty 5
  Filled 2019-10-23: qty 1
  Filled 2019-10-23 (×4): qty 5
  Filled 2019-10-23: qty 1
  Filled 2019-10-23 (×3): qty 5

## 2019-10-23 MED ORDER — SODIUM CHLORIDE 0.9% FLUSH: 3.0000 mL | INTRAVENOUS | Status: DC | PRN

## 2019-10-23 MED ORDER — LACTULOSE 10 GM/15ML PO SOLN
30.0000 g | Freq: Every day | ORAL | Status: DC
  Administered 2019-10-23 – 2019-10-24 (×2): 30 g via ORAL
  Filled 2019-10-23 (×2): qty 60

## 2019-10-23 MED ORDER — FUROSEMIDE 10 MG/ML IJ SOLN
80.0000 mg | Freq: Once | INTRAMUSCULAR | Status: AC
  Administered 2019-10-23: 80 mg via INTRAVENOUS
  Filled 2019-10-23: qty 8

## 2019-10-23 MED ORDER — ALBUTEROL SULFATE (2.5 MG/3ML) 0.083% IN NEBU: 2.5000 mg | INHALATION_SOLUTION | RESPIRATORY_TRACT | Status: DC | PRN

## 2019-10-23 MED ORDER — PANTOPRAZOLE SODIUM 40 MG PO TBEC
40.0000 mg | DELAYED_RELEASE_TABLET | Freq: Every day | ORAL | Status: DC
  Administered 2019-10-24 – 2019-11-02 (×10): 40 mg via ORAL
  Filled 2019-10-23 (×10): qty 1

## 2019-10-23 MED ORDER — INSULIN ASPART 100 UNIT/ML ~~LOC~~ SOLN
0.0000 [IU] | Freq: Three times a day (TID) | SUBCUTANEOUS | Status: DC
  Administered 2019-10-25 – 2019-10-28 (×2): 1 [IU] via SUBCUTANEOUS

## 2019-10-23 MED ORDER — ACETAMINOPHEN 500 MG PO TABS: 500.0000 mg | ORAL_TABLET | Freq: Two times a day (BID) | ORAL | Status: DC | PRN

## 2019-10-23 MED ORDER — WARFARIN SODIUM 2 MG PO TABS
2.0000 mg | ORAL_TABLET | Freq: Every day | ORAL | Status: DC
  Administered 2019-10-24 – 2019-10-26 (×3): 2 mg via ORAL
  Filled 2019-10-23 (×3): qty 1

## 2019-10-23 MED ORDER — BISACODYL 5 MG PO TBEC: 5.0000 mg | DELAYED_RELEASE_TABLET | Freq: Every day | ORAL | Status: DC | PRN

## 2019-10-23 MED ORDER — SODIUM CHLORIDE 0.9 % IV SOLN: 250.0000 mL | INTRAVENOUS | Status: DC | PRN

## 2019-10-23 MED ORDER — ONDANSETRON HCL 4 MG/2ML IJ SOLN: 4.0000 mg | Freq: Four times a day (QID) | INTRAMUSCULAR | Status: DC | PRN

## 2019-10-23 MED ORDER — POTASSIUM CHLORIDE CRYS ER 10 MEQ PO TBCR
10.0000 meq | EXTENDED_RELEASE_TABLET | Freq: Every morning | ORAL | Status: DC
  Administered 2019-10-24 – 2019-11-02 (×10): 10 meq via ORAL
  Filled 2019-10-23 (×17): qty 1

## 2019-10-23 MED ORDER — WARFARIN - PHARMACIST DOSING INPATIENT: Freq: Every day | Status: DC

## 2019-10-23 MED ORDER — INSULIN ASPART 100 UNIT/ML ~~LOC~~ SOLN
0.0000 [IU] | Freq: Every day | SUBCUTANEOUS | Status: DC
  Administered 2019-10-30: 2 [IU] via SUBCUTANEOUS
  Administered 2019-10-31: 3 [IU] via SUBCUTANEOUS

## 2019-10-23 MED ORDER — FLUTICASONE PROPIONATE 50 MCG/ACT NA SUSP
1.0000 | Freq: Every day | NASAL | Status: DC
  Administered 2019-10-23 – 2019-11-01 (×9): 1 via NASAL
  Filled 2019-10-23 (×2): qty 16

## 2019-10-23 MED ORDER — ALPRAZOLAM 0.5 MG PO TABS
0.5000 mg | ORAL_TABLET | Freq: Every evening | ORAL | Status: DC | PRN
  Administered 2019-10-23 – 2019-11-01 (×5): 0.5 mg via ORAL
  Filled 2019-10-23 (×5): qty 1

## 2019-10-23 NOTE — ED Notes (Signed)
ECHO tech at bedside at this time.

## 2019-10-23 NOTE — Progress Notes (Signed)
ANTICOAGULATION CONSULT NOTE - Initial Consult  Pharmacy Consult for warfarin Indication: mechanical AVR and afib  Allergies  Allergen Reactions   Tape Itching    Redness, Please use "paper" tape only    Patient Measurements: Height: 5\' 6"  (167.6 cm) Weight: 72.7 kg (160 lb 3.2 oz) IBW/kg (Calculated) : 59.3  Vital Signs: Temp: 97.6 F (36.4 C) (08/05 0949) Temp Source: Oral (08/05 0949) BP: 120/58 (08/05 1900) Pulse Rate: 74 (08/05 1900)  Labs: Recent Labs    10/23/19 1103  HGB 9.5*  HCT 30.8*  PLT 145*  LABPROT 31.7*  INR 3.2*  CREATININE 2.20*    Estimated Creatinine Clearance: 20.5 mL/min (A) (by C-G formula based on SCr of 2.2 mg/dL (H)).   Medical History: Past Medical History:  Diagnosis Date   Anxiety    Diabetes mellitus with neuropathy (HCC)    Diabetic neuropathy ([place])    DJD (degenerative joint disease)    H/O aortic valve replacement    Hyperlipidemia    Hypertension    Neuromuscular disorder ([place])    neuropathy in feet   Obesity    Sleep apnea    Stroke ([place])    " light stroke"   Wears glasses     Medications:  PTA medications include warfarin 2 mg po qday  Assessment: Pharmacy consulted to dose warfarin for this patient with a history of mechanical AVR and afib.  Last warfarin 2 mg dose was 10/23/19.  Goal of Therapy:  INR 2.5-3.5 Monitor platelets by anticoagulation protocol: Yes   Plan:  Continue warfarin 2 mg po qday starting 10/24/2019 Monitor daily INR for warfarin dose adjustments, CBC, s/s bleeding   Efraim Kaufmann, PharmD, BCPS 10/23/2019,7:30 PM

## 2019-10-23 NOTE — ED Triage Notes (Signed)
Pt sent over by [facility] (Dr. Harl Bowie) for heart failure. Pt reports increasingly worsening SOB and swelling of lower extremities and of abdomen. Know hx of heart failure.

## 2019-10-23 NOTE — Progress Notes (Signed)
Patient is refusing the use of CPAP for tonight. Patient states she will have RN contact RT  If she changes her mind.

## 2019-10-23 NOTE — Medical Student Note (Signed)
[facility] DEPT Provider Student Note For educational purposes for Medical, PA and NP students only and not part of the legal medical record.   CSN: [id number] Arrival date & time: 10/23/19  1191      History   Chief Complaint Chief Complaint  Patient presents with  . Shortness of Breath    HPI Mary [name] is a 79 y.o. female hx of acute on chronic diastolic heart failure with pulmonary htn Here from Dr. Harl Bowie who is concerned due to worsening edema and SOB.Per his note, he would like her to be admitted.  With husband who manages her medications he reports that she Took 2 Lasik pills today. Continiung to gain qweight. Reports woresning SOB and chronic dry cough exacerbated by laying flat and activity. Stopped using CPAP one year ago due to discomfort (only used 1 night but pt thought it helped significantly) no other oxygen use. Ambulates with a walker. Reports good appetite and OK sleep. Increased pain in feet, legs and stomach due to fluid retention.   Med list: Metolazone and lasix, metoprolol, simvastatin, coumadin, klorcon  EKG today: Afib   HPI  Past Medical History:  Diagnosis Date  . Anxiety   . Diabetes mellitus with neuropathy ([place])   . Diabetic neuropathy ([place])   . DJD (degenerative joint disease)   . H/O aortic valve replacement   . Hyperlipidemia   . Hypertension   . Neuromuscular disorder (HCC)    neuropathy in feet  . Obesity   . Sleep apnea   . Stroke [facility])    " light stroke"  . Wears glasses     Patient Active Problem List   Diagnosis Date Noted  . Anasarca 04/25/2019  . Hypoalbuminemia 04/25/2019  . Persistent atrial fibrillation ([place]) 04/25/2019  . CKD (chronic kidney disease), stage III 04/25/2019  . Anemia due to chronic kidney disease 04/25/2019  . Pressure injury of skin 04/24/2019  . Fall 04/23/2019  . S/P right knee arthroscopy 04/13/2015  . OSA (obstructive sleep apnea) 01/01/2013  . Diabetes ([place]) 10/23/2012  . Aortic valve  disorder 06/30/2010  . CVA (cerebral vascular accident) ([place]) 06/30/2010  . S/P aortic valve replacement 06/30/2010  . Hyperlipidemia 09/29/2008  . Obesity 09/29/2008  . Essential hypertension 09/29/2008  . DEGENERATIVE JOINT DISEASE 09/29/2008    Past Surgical History:  Procedure Laterality Date  . ANKLE SURGERY     Left tendon repair  . AORTIC VALVE REPLACEMENT  03/1994  . CARDIAC CATHETERIZATION     1996 [facility]  . CERVICAL SPINE SURGERY    . COLONOSCOPY    . HEMORRHOID SURGERY    . KNEE ARTHROSCOPY Right 04/13/2015   Procedure: RIGHT KNEE ARTHROSCOPY WITH DEBRIDEMENT AND PARTIAL MEDIAL MENISCECTOMY;  Surgeon: Mcarthur Rossetti, MD;  Location: [place];  Service: Orthopedics;  Laterality: Right;  . KNEE ARTHROSCOPY W/ MENISCECTOMY Right 04/13/2015  . LUMBAR FUSION      OB History   No obstetric history on file.      Home Medications    Prior to Admission medications   Medication Sig Start Date End Date Taking? Authorizing Provider  acetaminophen (TYLENOL) 500 MG tablet Take 500 mg by mouth 2 (two) times daily as needed for mild pain or moderate pain.     [provider]  ALPRAZolam Duanne Moron) 0.5 MG tablet Take 0.5 mg by mouth at bedtime as needed for sleep.     [provider]  bisacodyl (DULCOLAX) 5 MG EC tablet Take 5 mg  by mouth daily as needed for moderate constipation.    [provider]  cetirizine (ZYRTEC) 10 MG tablet Take 10 mg by mouth at bedtime as needed for allergies.  02/16/15   [provider]  fluticasone (FLONASE) 50 MCG/ACT nasal spray Place 1 spray into both nostrils at bedtime.  01/13/15   [provider]  HYDROcodone-acetaminophen (NORCO) 10-325 MG tablet Take 1 tablet by mouth 2 (two) times daily.  12/18/15   [provider]  metolazone (ZAROXOLYN) 10 MG tablet Take 0.5 tablets (5 mg total) by mouth daily as needed (if weight increases by >2lbs). 04/27/19   Kathie Dike, MD  metoprolol succinate  (TOPROL-XL) 100 MG 24 hr tablet TAKE 1 TABLET BY MOUTH ONCE DAILY WITH MEALS OR  IMMEDIATELY  FOLLOWING 10/06/19   Arnoldo Lenis, MD  metoprolol succinate (TOPROL-XL) 50 MG 24 hr tablet Take 2 tablets (100 mg total) by mouth every morning. Take with or immediately following a meal. 04/27/19   Kathie Dike, MD  pantoprazole (PROTONIX) 40 MG tablet Take 1 tablet (40 mg total) by mouth daily before breakfast. 03/21/16   Rehman, Mechele Dawley, MD  polyethylene glycol powder (GLYCOLAX/MIRALAX) 17 GM/SCOOP powder Take 17 g by mouth daily.    [provider]  potassium chloride (KLOR-CON) 10 MEQ tablet Take 10 mEq by mouth every morning. 04/05/19   [provider]  simvastatin (ZOCOR) 40 MG tablet Take 40 mg by mouth daily at 8 pm.  10/16/12   [provider]  torsemide (DEMADEX) 20 MG tablet Take 2 tablets (40 mg total) by mouth 2 (two) times daily. 04/15/19   Arnoldo Lenis, MD  Vitamin D, Ergocalciferol, (DRISDOL) 1.25 MG (50000 UT) CAPS capsule Take 50,000 Units by mouth every Sunday.     [provider]  warfarin (COUMADIN) 2 MG tablet Take 1 tablet daily or as directed by Coumadin clinic 08/28/19   Arnoldo Lenis, MD    Family History Family History  Problem Relation Age of Onset  . Pneumonia Sister   . Dementia Mother   . Diabetes Sister     Social History Social History   Tobacco Use  . Smoking status: Never Smoker  . Smokeless tobacco: Never Used  Vaping Use  . Vaping Use: Never used  Substance Use Topics  . Alcohol use: No    Alcohol/week: 0.0 standard drinks  . Drug use: No     Allergies   Tape   Review of Systems Review of Systems   Physical Exam Updated Vital Signs BP (!) 118/59 (BP Location: Left Arm)   Pulse 66   Temp 97.6 F (36.4 C) (Oral)   Resp 20   Ht 5\' 6"  (1.676 m)   Wt 72.7 kg   SpO2 100%   BMI 25.86 kg/m   Physical Exam  JVD Systolic murmur  Crackles in lungs  Significant abdominal distension and diffuse  mild tenderness, bowel sounds present Skin is warm without rash  Hard to assess pitting edema due to stocking, but obvious swelling in bilateral feet and ankles with darkening of skin    ED Treatments / Results  Labs (all labs ordered are listed, but only abnormal results are displayed) Labs Reviewed - No data to display  EKG  Radiology No results found.  Procedures Procedures (including critical care time)  Medications Ordered in ED Medications - No data to display   Initial Impression / Assessment and Plan / ED Course  I have reviewed the triage vital  signs and the nursing notes.  Pertinent labs & imaging results that were available during my care of the patient were reviewed by me and considered in my medical decision making (see chart for details).    Final Clinical Impressions(s) / ED Diagnoses   Final diagnoses:  None    New Prescriptions New Prescriptions   No medications on file

## 2019-10-23 NOTE — Progress Notes (Signed)
*  PRELIMINARY RESULTS* Echocardiogram 2D Echocardiogram has been performed.  Leavy Cella 10/23/2019, 3:14 PM

## 2019-10-23 NOTE — Progress Notes (Signed)
Clinical Summary Mary Mary [name] is a 79 y.o.female seen today for follow up of the following medical problems.   1. 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 09/2019 weight 156 lbs - weight today 162 lbs, up 6 lbs from 09/29/19.  - taking torsemide 80mg  bid, has been taking metolazone 10mg  daily per pcp last few days - +abdominal distension, LE edema. +SOB    2. Afib -no recent palpitations.  - on coumadin for afib and mechanical AVR    3. Pleural effusion/SOB - followed bypulmonary - we tried increasing her lasix to 60mg  dailyback in 4/2018however she developed AKI(Cr 0.8 to 1.28) last Cr down to 1., and dose was decreased back to 40mg  daily.  01/16/17 large right pleural effusion  - thoracentesis 01/25/17 with 1.2 liters removed. Cultures negative, gram stain +PMNs, pH 7.6, cytology reactive mesothelial cells - pleural LDH 112 - pleural protein: 3.6 - I do not have the serum LDH and totral protein numbers available to me -Repeat CXR 02/02/17 showed reaccumulating effusion - 10/2015 echo LVEF 55-60%, normal AVR    4. 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 On coumadin .    5. OSA - noncompliant with cpap  6 History of CVA - followed by pcp  7. Cirrhosis - noted on prior CT imaging. I do not see GI notes in epic. PCP notes mention history of cirrhosis in problem  list. I am not sure she has had a formal evaluation.  - some recent increased confusion per family - tense distended abdomen    Past Medical History:  Diagnosis Date  . Anxiety   . Diabetes mellitus with neuropathy ([place])   . Diabetic neuropathy ([place])   . DJD (degenerative joint disease)   . H/O aortic valve replacement   . Hyperlipidemia   . Hypertension   . Neuromuscular disorder ([facility])    neuropathy in feet  . Obesity   . Sleep apnea   . Stroke Mary Mary [name])    " light stroke"  . Wears glasses      Allergies  Allergen Reactions  . Tape Itching    Redness, Please use "paper" tape only     Current Outpatient Medications  Medication Sig Dispense Refill  . acetaminophen (TYLENOL) 500 MG tablet Take 500 mg by mouth 2 (two) times daily as needed for mild pain or moderate pain.     Marland Kitchen ALPRAZolam (XANAX) 0.5 MG tablet Take 0.5 mg by mouth at bedtime as needed for sleep.     . bisacodyl (DULCOLAX) 5 MG EC tablet Take 5 mg by mouth daily as needed for moderate constipation.    . cetirizine (ZYRTEC) 10 MG tablet Take 10 mg by mouth at bedtime as needed for allergies.   11  . fluticasone (FLONASE) 50 MCG/ACT nasal spray Place 1 spray into both nostrils at bedtime.   11  . HYDROcodone-acetaminophen ([place])  10-325 MG tablet Take 1 tablet by mouth 2 (two) times daily.   0  . metolazone (ZAROXOLYN) 10 MG tablet Take 0.5 tablets (5 mg total) by mouth daily as needed (if weight increases by >2lbs).    . metoprolol succinate (TOPROL-XL) 100 MG 24 hr tablet TAKE 1 TABLET BY MOUTH ONCE DAILY WITH MEALS OR  IMMEDIATELY  FOLLOWING 90 tablet 1  . metoprolol succinate (TOPROL-XL) 50 MG 24 hr tablet Take 2 tablets (100 mg total) by mouth every morning. Take with or immediately following a meal. 60 tablet 0  . pantoprazole (PROTONIX) 40 MG tablet Take 1 tablet (40 mg total) by mouth daily before breakfast. 30 tablet 5  . polyethylene glycol powder (GLYCOLAX/MIRALAX) 17 GM/SCOOP powder Take 17 g by mouth  daily.    . potassium chloride (KLOR-CON) 10 MEQ tablet Take 10 mEq by mouth every morning.    . simvastatin (ZOCOR) 40 MG tablet Take 40 mg by mouth daily at 8 pm.     . torsemide (DEMADEX) 20 MG tablet Take 2 tablets (40 mg total) by mouth 2 (two) times daily. 360 tablet 3  . Vitamin D, Ergocalciferol, (DRISDOL) 1.25 MG (50000 UT) CAPS capsule Take 50,000 Units by mouth every Sunday.     . warfarin (COUMADIN) 2 MG tablet Take 1 tablet daily or as directed by Coumadin clinic 100 tablet 1   No current facility-administered medications for this visit.     Past Surgical History:  Procedure Laterality Date  . ANKLE SURGERY     Left tendon repair  . AORTIC VALVE REPLACEMENT  03/1994  . CARDIAC CATHETERIZATION     [address] [facility]  . CERVICAL SPINE SURGERY    . COLONOSCOPY    . HEMORRHOID SURGERY    . KNEE ARTHROSCOPY Right 04/13/2015   Procedure: RIGHT KNEE ARTHROSCOPY WITH DEBRIDEMENT AND PARTIAL MEDIAL MENISCECTOMY;  Surgeon: Mary Rossetti, MD;  Location: [place];  Service: Orthopedics;  Laterality: Right;  . KNEE ARTHROSCOPY W/ MENISCECTOMY Right 04/13/2015  . LUMBAR FUSION       Allergies  Allergen Reactions  . Tape Itching    Redness, Please use "paper" tape only      Family History  Problem Relation Age of Onset  . Pneumonia Sister   . Dementia Mother   . Diabetes Sister      Social History Ms. Mary [name] reports that she has never smoked. She has never used smokeless tobacco. Ms. Mary Mary [name] reports no history of alcohol use.   Review of Systems CONSTITUTIONAL: No weight loss, fever, chills, weakness or fatigue.  HEENT: Eyes: No visual loss, blurred vision, double vision or yellow sclerae.No hearing loss, sneezing, congestion, runny nose or sore throat.  SKIN: No rash or itching.  CARDIOVASCULAR: per hpi RESPIRATORY: per hpi GASTROINTESTINAL: No anorexia, nausea, vomiting or diarrhea. No abdominal pain or blood.  GENITOURINARY: No burning on urination, no  polyuria NEUROLOGICAL: No headache, dizziness, syncope, paralysis, ataxia, numbness or tingling in the extremities. No change in bowel or bladder control.  MUSCULOSKELETAL: No muscle, back pain, joint pain or stiffness.  LYMPHATICS: No enlarged nodes. No history of splenectomy.  PSYCHIATRIC: No history of depression or anxiety.  ENDOCRINOLOGIC: No reports of sweating, cold or heat intolerance. No polyuria or polydipsia.  Marland Kitchen   Physical Examination Today's Vitals   10/23/19 0849  BP: (!) 110/58  Pulse: 72  SpO2: 94%  Weight: 162 lb (73.5 kg)  Height: 5\' 5"  (1.651 m)   Body mass index is 26.96  kg/m.  Gen: resting comfortably, no acute distress HEENT: no scleral icterus, pupils equal round and reactive, no palptable cervical adenopathy,  CV: irreg, 2/6 systolic murmur rusb, 3/6 systolic murmur apex. + JVD Resp: crackles bilateral bases GI: distended, NT MSK: extremities are warm, 1+ bilateral LE edema Skin: warm, no rash Neuro:  no focal deficits Psych: appropriate affect   Diagnostic Studies 10/2015 echo Study Conclusions  - Left ventricle: The cavity size was normal. Wall thickness was increased in a pattern of moderate LVH. Systolic function was normal. The estimated ejection fraction was in the range of 55% to 60%. The study is not technically sufficient to allow evaluation of LV diastolic function. - Aortic valve: Medical record indicated St Jude mechanical valve in the AV position, unknown size. There was no stenosis. There was mild regurgitation. - Mitral valve: Suspect possible mitral anular ring based on appearance, but cannot confirm. The valve surgery records are not available in the medical record. Moderately calcified annulus. Mildly thickened leaflets . There was mild regurgitation. - Left atrium: The atrium was moderately dilated. - Right atrium: The atrium was moderately dilated. - Atrial septum: No defect or patent foramen ovale was  identified. - Tricuspid valve: There was moderate regurgitation. - Pulmonary arteries: Systolic pressure was mildly increased. PA peak pressure: 38 mm Hg (S). - Technically adequate study.   07/2017 nuclear stress  Defect 1: There is a medium defect of mild severity present in the mid inferolateral and apical lateral location. This appears to be due to soft tissue attenuation. No ischemic zones.  This is a low risk study.  Nuclear stress EF: 63%.  LBBB present throughout study.  Jan 2020 Event monitor  7 day event monitor  Min HR 55, Max HR 110, Avg HR 70  Episodes of rate controlled atrial fibrillation are noted  No symptoms reported   10/2018 heart monitor 7 days  7 day event monitor  Min HR 55, Max HR 119, Avg HR 71  No symptoms reported  Tracings show she is in afib throughout the study, rate controlled    Assessment and Plan  1. Acute on chronic diastolic HF complicated by pulmonary HTN and RV dysfunction, cirrhosis by imaging - progressing LE edema but more so abdominal distension despite taking torsemide 80mg  bid and metoalzone 10mg , 5 lbs weight gain 155 to 160 despite this regimen - failure to diurese at home on very strong regimen, worsening renal function - have recommended admission. Needs repeat echo, IV diuresis - may need consideration for RHC pending intial workup - noncompliant with cpap, history of left sided heart disease with diastolic HF, cirrhosis likely may etiologies for her pulm HTN    2. Cirrhosis - unclear history to me, noted on imaging over the years. I do not see details about the diagnosis or a prior GI evaluation in epic.  - significant ascties on exam - unclear how much is playing a role in her volume overload, AKI.  - would suggest GI evaluation during admission - some confusion per family, would check an ammonia level - may need paracentesis for ascites.   3. AKI - last labs showed significant elevation in Cr, has  been on very strong doses of diuretics at home however despite this having progressing weight gain, fluid retention - repeat echo - remains volume overloaded, would dose IV lasix 60mg  bid today as inpatient.  4. Mechanical AVR - continue coumadin   I have recommended hospital evaluation due to complex heart failure, cirrhosis,  and AKI that is failing management at home. I have contacted ER staff      Arnoldo Lenis, M.D.

## 2019-10-23 NOTE — H&P (Signed)
Patient Demographics:    Mary [name], is a 79 y.o. female  MRN: [id number]   DOB - Aug 14, 1940  Admit Date - 10/23/2019  Outpatient Primary MD for the patient is [name], MD   Assessment & Plan:    Principal Problem:   Acute exacerbation of dCHF (congestive heart failure) ([place]) Active Problems:   Essential hypertension   CVA (cerebral vascular accident) ([place])   S/P aortic valve replacement   Diabetes ([place])   OSA (obstructive sleep apnea)   CKD (chronic kidney disease), stage III    1)HFpEF/Acute on Chronic Diastolic Dysfunction CHF Exac--- in the Setting of pulmonary HTN and RV dysfunction - 04/2019 echo LVEF 60-65%, elevated LA pressure, moderate RV dysfunction, severe RVE, severe biatrial enlargement, mod MR, severe TR, severe pulm HTN -Repeat echo on 10/23/2019 with EF down to 55 to 60%, with severe pulmonary hypertension PASP of 65 -BNP 565 -Patient continues to have with overload issues despite taking torsemide 80mg  bid, metolazone 10mg  as needed--- her creatinine was trending up with attempt at diuresis as outpatient, she also developed ascites-- ---  2) status post aortic valve replacement with [facility] Jude AVR in 1995/1996--- continue Coumadin therapy for mechanical valve -INR is currently 3.2 =-Pharmacy consult for Coumadin management  3)OSA--- history of noncompliance with CPAP  4) history of paroxysmal A. fib and prior CVA--- on Coumadin as above #1, continue metoprolol for rate control  5) history of cirrhosis on prior imaging studies--- does not appear to have had work-up for cirrhosis, currently appears to have ascites, abdominal ultrasound requested =-Ammonia is 53 , no significant encephalopathy, give lactulose  6)AKI----acute kidney injury on CKD stage - IIIb    creatinine on  admission=2.20  , baseline creatinine =1.4     ---renally adjust medications, avoid nephrotoxic agents / dehydration  / hypotension -Monitor renal function closely with aggressive diuresis  7) chronic anemia----Hgb currently stable around 9.5 which is patient's baseline  8)DM2- Use Novolog/Humalog Sliding scale insulin with Accu-Cheks/Fingersticks as ordered    Disposition/Need for in-Hospital Stay- patient unable to be discharged at this time due to --CHF exacerbation and worsening renal function requiring IV diuresis and close monitoring of renal function while diuresing patient, failed outpatient oral diuretics  Status is: Inpatient  Remains inpatient appropriate because:HF exacerbation and worsening renal function requiring IV diuresis and close monitoring of renal function while diuresing patient, failed outpatient oral diuretics   Dispo: The patient is from: Home              Anticipated d/c is to: Home              Anticipated d/c date is: 2 days              Patient currently is not medically stable to d/c. Barriers: Not Clinically Stable- HF exacerbation and worsening renal function requiring IV diuresis and close monitoring of renal function while diuresing patient, failed outpatient oral diuretics  With History of - Reviewed by me  Past Medical History:  Diagnosis Date  . Anxiety   . Diabetes mellitus with neuropathy ([place])   . Diabetic neuropathy ([place])   . DJD (degenerative joint disease)   . H/O aortic valve replacement   . Hyperlipidemia   . Hypertension   . Neuromuscular disorder (H[facility])    neuropathy in feet  . Obesity   . Sleep apnea   . Stroke [facility])    " light stroke"  . Wears glasses       Past Surgical History:  Procedure Laterality Date  . ANKLE SURGERY     Left tendon repair  . AORTIC VALVE REPLACEMENT  03/1994  . CARDIAC CATHETERIZATION     1996 [facility]  . CERVICAL SPINE SURGERY    . COLONOSCOPY    . HEMORRHOID SURGERY    . KNEE  ARTHROSCOPY Right 04/13/2015   Procedure: RIGHT KNEE ARTHROSCOPY WITH DEBRIDEMENT AND PARTIAL MEDIAL MENISCECTOMY;  Surgeon: Mcarthur Rossetti, MD;  Location: [place];  Service: Orthopedics;  Laterality: Right;  . KNEE ARTHROSCOPY W/ MENISCECTOMY Right 04/13/2015  . LUMBAR FUSION        Chief Complaint  Patient presents with  . Shortness of Breath      HPI:    Mary [name]  is a 79 y.o. female with past medical history relevant for diastolic dysfunction CHF, HTN, status post aortic valve replacement/mechanical [facility] Jude valve, 1995/1996, paroxysmal atrial fibrillation, history of prior stroke, history of DM, CKD 3b, OSA, presents from cardiology office with worsening shortness of breath and edema/fluid overload status despite aggressive diuresis and creatinine trending up -Cardiologist recommended inpatient diuresis to allow for monitoring of renal function closely -No chest pains no palpitations no dizziness -No fevers no chills no productive cough  Chest x-ray with right-sided pleural effusion and cardiomegaly -Creatinine up to 2.2, elevated BNP noted, INR 3.2 hemoglobin 9.5 which is around patient's baseline   Review of systems:    In addition to the HPI above,   A full Review of  Systems was done, all other systems reviewed are negative except as noted above in HPI , .    Social History:  Reviewed by me    Social History   Tobacco Use  . Smoking status: Never Smoker  . Smokeless tobacco: Never Used  Substance Use Topics  . Alcohol use: No    Alcohol/week: 0.0 standard drinks       Family History :  Reviewed by me    Family History  Problem Relation Age of Onset  . Pneumonia Sister   . Dementia Mother   . Diabetes Sister      Home Medications:   Prior to Admission medications   Medication Sig Start Date End Date Taking? Authorizing Provider  acetaminophen (TYLENOL) 500 MG tablet Take 500 mg by mouth 2 (two) times daily as needed for mild pain or  moderate pain.    Yes [provider]  ALPRAZolam Duanne Moron) 0.5 MG tablet Take by mouth daily. In the mornings   Yes [provider]  bisacodyl (DULCOLAX) 5 MG EC tablet Take 5 mg by mouth daily as needed for moderate constipation.   Yes [provider]  cetirizine (ZYRTEC) 10 MG tablet Take 10 mg by mouth at bedtime as needed for allergies.  02/16/15  Yes [provider]  fluticasone (FLONASE) 50 MCG/ACT nasal spray Place 1 spray into both nostrils at bedtime.  01/13/15  Yes [provider]  HYDROcodone-acetaminophen (NORCO) 10-325 MG tablet Take 1 tablet by mouth 2 (two) times daily.  12/18/15  Yes [provider]  metolazone (ZAROXOLYN) 10 MG tablet Take 0.5 tablets (5 mg total) by mouth daily as needed (if weight increases by >2lbs). Patient taking differently: Take 10 mg by mouth daily.  04/27/19  Yes Kathie Dike, MD  metoprolol succinate (TOPROL-XL) 100 MG 24 hr tablet TAKE 1 TABLET BY MOUTH ONCE DAILY WITH MEALS OR  IMMEDIATELY  FOLLOWING 10/06/19  Yes Branch, Alphonse Guild, MD  pantoprazole (PROTONIX) 40 MG tablet Take 1 tablet (40 mg total) by mouth daily before breakfast. 03/21/16  Yes Rehman, Mechele Dawley, MD  polyethylene glycol powder (GLYCOLAX/MIRALAX) 17 GM/SCOOP powder Take 17 g by mouth daily.   Yes [provider]  potassium chloride (KLOR-CON) 10 MEQ tablet Take 10 mEq by mouth every morning. 04/05/19  Yes [provider]  simvastatin (ZOCOR) 40 MG tablet Take 40 mg by mouth daily at 8 pm.  10/16/12  Yes [provider]  torsemide (DEMADEX) 20 MG tablet Take 2 tablets (40 mg total) by mouth 2 (two) times daily. 04/15/19  Yes Branch, Alphonse Guild, MD  vitamin B-12 (CYANOCOBALAMIN) 500 MCG tablet Take 500 mcg by mouth daily.   Yes [provider]  Vitamin D, Ergocalciferol, (DRISDOL) 1.25 MG (50000 UT) CAPS capsule Take 50,000 Units by mouth every Sunday.    Yes [provider]  warfarin (COUMADIN) 2  MG tablet Take 1 tablet daily or as directed by Coumadin clinic 08/28/19  Yes Branch, Alphonse Guild, MD  metoprolol succinate (TOPROL-XL) 50 MG 24 hr tablet Take 2 tablets (100 mg total) by mouth every morning. Take with or immediately following a meal. Patient not taking: Reported on 10/23/2019 04/27/19   Kathie Dike, MD     Allergies:     Allergies  Allergen Reactions  . Tape Itching    Redness, Please use "paper" tape only     Physical Exam:   Vitals  Blood pressure (!) 108/50, pulse 69, temperature 97.6 F (36.4 C), temperature source Oral, resp. rate 20, height 5\' 6"  (1.676 m), weight 72.7 kg, SpO2 95 %.  Physical Examination: General appearance - alert, well appearing, and in no distress and  Mental status - alert, oriented to person, place, and time,  Eyes - sclera anicteric Neck - supple, no JVD elevation , Chest -somewhat diminished, no wheezing Heart - S1 and S2 normal, regular , prior sternotomy scar Abdomen - soft, nontender, nondistended, no masses or organomegaly Neurological - screening mental status exam normal, neck supple without rigidity, cranial nerves II through XII intact, DTR's normal and symmetric Extremities - + pedal edema noted, intact peripheral pulses  Skin - warm, dry     Data Review:    CBC Recent Labs  Lab 10/20/19 1043 10/23/19 1103  WBC 4.2 4.6  HGB 9.4* 9.5*  HCT 29.9* 30.8*  PLT 143* 145*  MCV 113.7* 114.1*  MCH 35.7* 35.2*  MCHC 31.4 30.8  RDW 16.5* 16.4*  LYMPHSABS 0.6*  --   MONOABS 0.5  --   EOSABS 0.4  --   BASOSABS 0.0  --    ------------------------------------------------------------------------------------------------------------------  Chemistries  Recent Labs  Lab 10/20/19 1043 10/23/19 1103  NA 135 136  K 3.2* 3.4*  CL 92* 92*  CO2 31 30  GLUCOSE 145* 112*  BUN 60* 61*  CREATININE 2.37* 2.20*  CALCIUM 8.6* 8.7*  MG  --  2.5*  AST 24  --  ALT 9  --   ALKPHOS 67  --   BILITOT 1.5*  --     ------------------------------------------------------------------------------------------------------------------ estimated creatinine clearance is 20.5 mL/min (A) (by C-G formula based on SCr of 2.2 mg/dL (H)). ------------------------------------------------------------------------------------------------------------------ No results for input(s): TSH, T4TOTAL, T3FREE, THYROIDAB in the last 72 hours.  Invalid input(s): FREET3   Coagulation profile Recent Labs  Lab 10/23/19 1103  INR 3.2*   ------------------------------------------------------------------------------------------------------------------- No results for input(s): DDIMER in the last 72 hours. -------------------------------------------------------------------------------------------------------------------  Cardiac Enzymes No results for input(s): CKMB, TROPONINI, MYOGLOBIN in the last 168 hours.  Invalid input(s): CK ------------------------------------------------------------------------------------------------------------------    Component Value Date/Time   BNP 565.0 (H) 10/23/2019 1103     ---------------------------------------------------------------------------------------------------------------  Urinalysis    Component Value Date/Time   COLORURINE STRAW (A) 04/26/2019 2003   APPEARANCEUR CLEAR 04/26/2019 2003   LABSPEC 1.005 04/26/2019 2003   PHURINE 6.0 04/26/2019 2003   GLUCOSEU NEGATIVE 04/26/2019 2003   [place] NEGATIVE 04/26/2019 2003   [place] NEGATIVE 04/26/2019 2003   [place] NEGATIVE 04/26/2019 2003   PROTEINUR NEGATIVE 04/26/2019 2003   UROBILINOGEN 2.0 (H) 11/01/2007 1518   NITRITE NEGATIVE 04/26/2019 2003   LEUKOCYTESUR NEGATIVE 04/26/2019 2003    ----------------------------------------------------------------------------------------------------------------   Imaging Results:    DG Chest Portable 1 View  Result Date: 10/23/2019 CLINICAL DATA:  Evaluate for pulmonary  edema EXAM: PORTABLE CHEST 1 VIEW COMPARISON:  10/14/2019 FINDINGS: Cardiomegaly. Prior median sternotomy. Moderate right pleural effusion with right lower lobe atelectasis. Areas of scarring in the lungs bilaterally. No overt edema. No acute bony abnormality. IMPRESSION: Stable moderate right effusion with right base atelectasis. Cardiomegaly. Chronic changes.  No overt edema. Electronically Signed   By: Rolm Baptise M.D.   On: 10/23/2019 11:00   ECHOCARDIOGRAM COMPLETE  Result Date: 10/23/2019    ECHOCARDIOGRAM REPORT   Patient Name:   Mary [name] Date of Exam: 10/23/2019 Medical Rec #:  [id number]   Height:       66.0 in Accession #:    [id number]  Weight:       160.2 lb Date of Birth:  04-09-1940   BSA:          1.820 m Patient Age:    37 years    BP:           119/65 mmHg Patient Gender: F           HR:           71 bpm. Exam Location:  [facility] Procedure: 2D Echo Indications:    Dyspnea 786.09 / R06.00  History:        Patient has prior history of Echocardiogram examinations, most                 recent 04/24/2019. Stroke, Arrythmias:Atrial Fibrillation; Risk                 Factors:Non-Smoker, Dyslipidemia, Diabetes and Hypertension.                 CKD.  Sonographer:    Leavy Cella RDCS (AE) Referring Phys: [id number] [place]  1. Left ventricular ejection fraction, by estimation, is 55 to 60%. The left ventricle has normal function. The left ventricle has no regional wall motion abnormalities. Left ventricular diastolic parameters are indeterminate.  2. Right ventricular systolic function is severely reduced. The right ventricular size is severely enlarged. There is moderately elevated pulmonary artery systolic pressure.  3. Left atrial size was moderately dilated.  4. Right  atrial size was severely dilated.  5. The mitral valve is normal in structure. Mild mitral valve regurgitation. No evidence of mitral stenosis.  6. The tricuspid valve is abnormal. Tricuspid valve regurgitation is  severe.  7. There is a mechanical valve in the AV position. From medical record St Jude valve, unknown type and size. . The aortic valve has been repaired/replaced. Aortic valve regurgitation is not visualized. No aortic stenosis is present.  8. Severe pulmonary HTN, PASP is 65 mmHg.  9. The inferior vena cava is dilated in size with <50% respiratory variability, suggesting right atrial pressure of 15 mmHg. FINDINGS  Left Ventricle: Left ventricular ejection fraction, by estimation, is 55 to 60%. The left ventricle has normal function. The left ventricle has no regional wall motion abnormalities. The left ventricular internal cavity size was small. There is no left ventricular hypertrophy. Left ventricular diastolic parameters are indeterminate. Right Ventricle: The right ventricular size is severely enlarged. No increase in right ventricular wall thickness. Right ventricular systolic function is severely reduced. There is moderately elevated pulmonary artery systolic pressure. The tricuspid regurgitant velocity is 3.46 m/s, and with an assumed right atrial pressure of 10 mmHg, the estimated right ventricular systolic pressure is 71.6 mmHg. Left Atrium: Left atrial size was moderately dilated. Right Atrium: Right atrial size was severely dilated. Pericardium: There is no evidence of pericardial effusion. Mitral Valve: The mitral valve is normal in structure. There is mild thickening of the mitral valve leaflet(s). There is mild calcification of the mitral valve leaflet(s). Mild mitral annular calcification. Mild mitral valve regurgitation. No evidence of  mitral valve stenosis. Tricuspid Valve: The tricuspid valve is abnormal. Tricuspid valve regurgitation is severe. No evidence of tricuspid stenosis. Aortic Valve: There is a mechanical valve in the AV position. From medical record St Jude valve, unknown type and size. The aortic valve has been repaired/replaced. Aortic valve regurgitation is not visualized. No  aortic stenosis is present. Aortic valve  mean gradient measures 3.8 mmHg. Aortic valve peak gradient measures 6.4 mmHg. Pulmonic Valve: The pulmonic valve was not well visualized. Pulmonic valve regurgitation is mild to moderate. No evidence of pulmonic stenosis. Aorta: The aortic root is normal in size and structure. Pulmonary Artery: Severe pulmonary HTN, PASP is 65 mmHg. Venous: The inferior vena cava is dilated in size with less than 50% respiratory variability, suggesting right atrial pressure of 15 mmHg. IAS/Shunts: The interatrial septum was not well visualized.  LEFT VENTRICLE PLAX 2D LVIDd:         4.01 cm Diastology LVIDs:         3.50 cm LV e' lateral:   7.97 cm/s LV PW:         1.13 cm LV E/e' lateral: 14.1 LV IVS:        0.70 cm LV e' medial:    7.31 cm/s                        LV E/e' medial:  15.3  RIGHT VENTRICLE RV S prime:     4.05 cm/s TAPSE (M-mode): 0.8 cm LEFT ATRIUM             Index       RIGHT ATRIUM           Index LA diam:        3.60 cm 1.98 cm/m  RA Area:     18.80 cm LA Vol (A2C):   69.0 ml 37.92 ml/m RA Volume:  54.60 ml  30.00 ml/m LA Vol (A4C):   59.8 ml 32.86 ml/m LA Biplane Vol: 64.9 ml 35.66 ml/m  AORTIC VALVE AV Vmax:           126.67 cm/s AV Vmean:          93.430 cm/s AV VTI:            0.268 m AV Peak Grad:      6.4 mmHg AV Mean Grad:      3.8 mmHg LVOT Vmax:         64.97 cm/s LVOT Vmean:        44.995 cm/s LVOT VTI:          0.146 m LVOT/AV VTI ratio: 0.55  AORTA Ao Root diam: 2.30 cm MITRAL VALVE                TRICUSPID VALVE MV Area (PHT): 3.33 cm     TR Peak grad:   47.9 mmHg MV Decel Time: 228 msec     TR Vmax:        346.00 cm/s MV E velocity: 112.00 cm/s                             SHUNTS                             Systemic VTI: 0.15 m Carlyle Dolly MD Electronically signed by Carlyle Dolly MD Signature Date/Time: 10/23/2019/3:56:43 PM    Final     Radiological Exams on Admission: DG Chest Portable 1 View  Result Date: 10/23/2019 CLINICAL DATA:   Evaluate for pulmonary edema EXAM: PORTABLE CHEST 1 VIEW COMPARISON:  10/14/2019 FINDINGS: Cardiomegaly. Prior median sternotomy. Moderate right pleural effusion with right lower lobe atelectasis. Areas of scarring in the lungs bilaterally. No overt edema. No acute bony abnormality. IMPRESSION: Stable moderate right effusion with right base atelectasis. Cardiomegaly. Chronic changes.  No overt edema. Electronically Signed   By: Rolm Baptise M.D.   On: 10/23/2019 11:00   ECHOCARDIOGRAM COMPLETE  Result Date: 10/23/2019    ECHOCARDIOGRAM REPORT   Patient Name:   Mary [name] Date of Exam: 10/23/2019 Medical Rec #:  [id number]   Height:       66.0 in Accession #:    [id number]  Weight:       160.2 lb Date of Birth:  1940-06-29   BSA:          1.820 m Patient Age:    21 years    BP:           119/65 mmHg Patient Gender: F           HR:           71 bpm. Exam Location:  [facility] Procedure: 2D Echo Indications:    Dyspnea 786.09 / R06.00  History:        Patient has prior history of Echocardiogram examinations, most                 recent 04/24/2019. Stroke, Arrythmias:Atrial Fibrillation; Risk                 Factors:Non-Smoker, Dyslipidemia, Diabetes and Hypertension.                 CKD.  Sonographer:    Leavy Cella RDCS (AE) Referring Phys: [id number] [place]  1. Left  ventricular ejection fraction, by estimation, is 55 to 60%. The left ventricle has normal function. The left ventricle has no regional wall motion abnormalities. Left ventricular diastolic parameters are indeterminate.  2. Right ventricular systolic function is severely reduced. The right ventricular size is severely enlarged. There is moderately elevated pulmonary artery systolic pressure.  3. Left atrial size was moderately dilated.  4. Right atrial size was severely dilated.  5. The mitral valve is normal in structure. Mild mitral valve regurgitation. No evidence of mitral stenosis.  6. The tricuspid valve is abnormal. Tricuspid  valve regurgitation is severe.  7. There is a mechanical valve in the AV position. From medical record St Jude valve, unknown type and size. . The aortic valve has been repaired/replaced. Aortic valve regurgitation is not visualized. No aortic stenosis is present.  8. Severe pulmonary HTN, PASP is 65 mmHg.  9. The inferior vena cava is dilated in size with <50% respiratory variability, suggesting right atrial pressure of 15 mmHg. FINDINGS  Left Ventricle: Left ventricular ejection fraction, by estimation, is 55 to 60%. The left ventricle has normal function. The left ventricle has no regional wall motion abnormalities. The left ventricular internal cavity size was small. There is no left ventricular hypertrophy. Left ventricular diastolic parameters are indeterminate. Right Ventricle: The right ventricular size is severely enlarged. No increase in right ventricular wall thickness. Right ventricular systolic function is severely reduced. There is moderately elevated pulmonary artery systolic pressure. The tricuspid regurgitant velocity is 3.46 m/s, and with an assumed right atrial pressure of 10 mmHg, the estimated right ventricular systolic pressure is 34.1 mmHg. Left Atrium: Left atrial size was moderately dilated. Right Atrium: Right atrial size was severely dilated. Pericardium: There is no evidence of pericardial effusion. Mitral Valve: The mitral valve is normal in structure. There is mild thickening of the mitral valve leaflet(s). There is mild calcification of the mitral valve leaflet(s). Mild mitral annular calcification. Mild mitral valve regurgitation. No evidence of  mitral valve stenosis. Tricuspid Valve: The tricuspid valve is abnormal. Tricuspid valve regurgitation is severe. No evidence of tricuspid stenosis. Aortic Valve: There is a mechanical valve in the AV position. From medical record St Jude valve, unknown type and size. The aortic valve has been repaired/replaced. Aortic valve regurgitation is  not visualized. No aortic stenosis is present. Aortic valve  mean gradient measures 3.8 mmHg. Aortic valve peak gradient measures 6.4 mmHg. Pulmonic Valve: The pulmonic valve was not well visualized. Pulmonic valve regurgitation is mild to moderate. No evidence of pulmonic stenosis. Aorta: The aortic root is normal in size and structure. Pulmonary Artery: Severe pulmonary HTN, PASP is 65 mmHg. Venous: The inferior vena cava is dilated in size with less than 50% respiratory variability, suggesting right atrial pressure of 15 mmHg. IAS/Shunts: The interatrial septum was not well visualized.  LEFT VENTRICLE PLAX 2D LVIDd:         4.01 cm Diastology LVIDs:         3.50 cm LV e' lateral:   7.97 cm/s LV PW:         1.13 cm LV E/e' lateral: 14.1 LV IVS:        0.70 cm LV e' medial:    7.31 cm/s                        LV E/e' medial:  15.3  RIGHT VENTRICLE RV S prime:     4.05 cm/s TAPSE (M-mode): 0.8 cm LEFT ATRIUM  Index       RIGHT ATRIUM           Index LA diam:        3.60 cm 1.98 cm/m  RA Area:     18.80 cm LA Vol (A2C):   69.0 ml 37.92 ml/m RA Volume:   54.60 ml  30.00 ml/m LA Vol (A4C):   59.8 ml 32.86 ml/m LA Biplane Vol: 64.9 ml 35.66 ml/m  AORTIC VALVE AV Vmax:           126.67 cm/s AV Vmean:          93.430 cm/s AV VTI:            0.268 m AV Peak Grad:      6.4 mmHg AV Mean Grad:      3.8 mmHg LVOT Vmax:         64.97 cm/s LVOT Vmean:        44.995 cm/s LVOT VTI:          0.146 m LVOT/AV VTI ratio: 0.55  AORTA Ao Root diam: 2.30 cm MITRAL VALVE                TRICUSPID VALVE MV Area (PHT): 3.33 cm     TR Peak grad:   47.9 mmHg MV Decel Time: 228 msec     TR Vmax:        346.00 cm/s MV E velocity: 112.00 cm/s                             SHUNTS                             Systemic VTI: 0.15 m Carlyle Dolly MD Electronically signed by Carlyle Dolly MD Signature Date/Time: 10/23/2019/3:56:43 PM    Final    DVT Prophylaxis -SCD  /coumadin AM Labs Ordered, also please review Full  Orders  Family Communication: Admission, patients condition and plan of care including tests being ordered have been discussed with the patient and husband* who indicate understanding and agree with the plan   Code Status - Full Code  Likely DC to  Home after stabilization of renal function and adequate diuresis  Condition   stable  Roxan Hockey M.D on 10/23/2019 at 6:52 PM Go to [email or web address] -  for contact info  [facility] - Office  [phone]

## 2019-10-23 NOTE — ED Provider Notes (Signed)
[facility] EMERGENCY DEPARTMENT Provider Note   CSN: [id number] Arrival date & time: 10/23/19  4132     History Chief Complaint  Patient presents with  . Shortness of Breath    Mary [name] is a 79 y.o. female.  HPI     79 year old female comes in a chief complaint of shortness of breath and worsening swelling.  Patient has history of CHF and multiple valvular disorders.  She is seen by cardiology service in [place].  Patient reports that despite her taking her Lasix, she has had increased weight gain, shortness of breath.  Her shortness of breath is worse with minimal exertion.  She is also having some orthopnea, PND-like symptoms.  I received a call from cardiology service reporting that patient's creatinine has gone up despite their efforts at diuresing her as an outpatient.  They recommend that patient be admitted for CHF work-up.  There is also questionable cirrhosis history.  Patient reports increased abdominal girth.    Past Medical History:  Diagnosis Date  . Anxiety   . Diabetes mellitus with neuropathy ([place])   . Diabetic neuropathy ([place])   . DJD (degenerative joint disease)   . H/O aortic valve replacement   . Hyperlipidemia   . Hypertension   . Neuromuscular disorder (HCC)    neuropathy in feet  . Obesity   . Sleep apnea   . 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 Wears glasses     Patient Active Problem List   Diagnosis Date Noted  . Acute exacerbation of CHF (congestive heart failure) ([place]) 10/23/2019  . Anasarca 04/25/2019  . Hypoalbuminemia 04/25/2019  . Persistent atrial fibrillation ([place]) 04/25/2019  . CKD (chronic kidney disease), stage III 04/25/2019  . Anemia due to chronic kidney disease 04/25/2019  . Pressure injury of skin 04/24/2019  . Fall 04/23/2019  . S/P right knee arthroscopy 04/13/2015  . OSA (obstructive sleep apnea) 01/01/2013  . Diabetes ([place]) 10/23/2012  . Aortic valve disorder 06/30/2010  . CVA (cerebral vascular accident) ([place])  06/30/2010  . S/P aortic valve replacement 06/30/2010  . Hyperlipidemia 09/29/2008  . Obesity 09/29/2008  . Essential hypertension 09/29/2008  . DEGENERATIVE JOINT DISEASE 09/29/2008    Past Surgical History:  Procedure Laterality Date  . ANKLE SURGERY     Left tendon repair  . AORTIC VALVE REPLACEMENT  03/1994  . CARDIAC CATHETERIZATION     1996 [facility]  . CERVICAL SPINE SURGERY    . COLONOSCOPY    . HEMORRHOID SURGERY    . KNEE ARTHROSCOPY Right 04/13/2015   Procedure: RIGHT KNEE ARTHROSCOPY WITH DEBRIDEMENT AND PARTIAL MEDIAL MENISCECTOMY;  Surgeon: Mcarthur Rossetti, MD;  Location: [place];  Service: Orthopedics;  Laterality: Right;  . KNEE ARTHROSCOPY W/ MENISCECTOMY Right 04/13/2015  . LUMBAR FUSION       OB History   No obstetric history on file.     Family History  Problem Relation Age of Onset  . Pneumonia Sister   . Dementia Mother   . Diabetes Sister     Social History   Tobacco Use  . Smoking status: Never Smoker  . Smokeless tobacco: Never Used  Vaping Use  . Vaping Use: Never used  Substance Use Topics  . Alcohol use: No    Alcohol/week: 0.0 standard drinks  . Drug use: No    Home Medications Prior to Admission medications   Medication Sig Start Date End Date Taking? Authorizing Provider  acetaminophen (TYLENOL) 500 MG tablet  Take 500 mg by mouth 2 (two) times daily as needed for mild pain or moderate pain.    Yes [provider]  ALPRAZolam Duanne Moron) 0.5 MG tablet Take by mouth daily. In the mornings   Yes [provider]  bisacodyl (DULCOLAX) 5 MG EC tablet Take 5 mg by mouth daily as needed for moderate constipation.   Yes [provider]  cetirizine (ZYRTEC) 10 MG tablet Take 10 mg by mouth at bedtime as needed for allergies.  02/16/15  Yes [provider]  fluticasone (FLONASE) 50 MCG/ACT nasal spray Place 1 spray into both nostrils at bedtime.  01/13/15  Yes [provider]   HYDROcodone-acetaminophen (NORCO) 10-325 MG tablet Take 1 tablet by mouth 2 (two) times daily.  12/18/15  Yes [provider]  metolazone (ZAROXOLYN) 10 MG tablet Take 0.5 tablets (5 mg total) by mouth daily as needed (if weight increases by >2lbs). Patient taking differently: Take 10 mg by mouth daily.  04/27/19  Yes Kathie Dike, MD  metoprolol succinate (TOPROL-XL) 100 MG 24 hr tablet TAKE 1 TABLET BY MOUTH ONCE DAILY WITH MEALS OR  IMMEDIATELY  FOLLOWING 10/06/19  Yes Branch, Alphonse Guild, MD  pantoprazole (PROTONIX) 40 MG tablet Take 1 tablet (40 mg total) by mouth daily before breakfast. 03/21/16  Yes Rehman, Mechele Dawley, MD  polyethylene glycol powder (GLYCOLAX/MIRALAX) 17 GM/SCOOP powder Take 17 g by mouth daily.   Yes [provider]  potassium chloride (KLOR-CON) 10 MEQ tablet Take 10 mEq by mouth every morning. 04/05/19  Yes [provider]  simvastatin (ZOCOR) 40 MG tablet Take 40 mg by mouth daily at 8 pm.  10/16/12  Yes [provider]  torsemide (DEMADEX) 20 MG tablet Take 2 tablets (40 mg total) by mouth 2 (two) times daily. 04/15/19  Yes Branch, Alphonse Guild, MD  vitamin B-12 (CYANOCOBALAMIN) 500 MCG tablet Take 500 mcg by mouth daily.   Yes [provider]  Vitamin D, Ergocalciferol, (DRISDOL) 1.25 MG (50000 UT) CAPS capsule Take 50,000 Units by mouth every Sunday.    Yes [provider]  warfarin (COUMADIN) 2 MG tablet Take 1 tablet daily or as directed by Coumadin clinic 08/28/19  Yes Branch, Alphonse Guild, MD  metoprolol succinate (TOPROL-XL) 50 MG 24 hr tablet Take 2 tablets (100 mg total) by mouth every morning. Take with or immediately following a meal. Patient not taking: Reported on 10/23/2019 04/27/19   Kathie Dike, MD    Allergies    Tape  Review of Systems   Review of Systems  Constitutional: Positive for activity change.  Respiratory: Negative for shortness of breath.   Cardiovascular: Negative for chest pain.   Gastrointestinal: Positive for abdominal distention.  Allergic/Immunologic: Negative for immunocompromised state.  Hematological: Does not bruise/bleed easily.  All other systems reviewed and are negative.   Physical Exam Updated Vital Signs BP 119/65   Pulse 71   Temp 97.6 F (36.4 C) (Oral)   Resp 20   Ht 5\' 6"  (1.676 m)   Wt 72.7 kg   SpO2 100%   BMI 25.86 kg/m   Physical Exam Vitals and nursing note reviewed.  Constitutional:      Appearance: She is well-developed.  HENT:     Head: Normocephalic and atraumatic.  Cardiovascular:     Rate and Rhythm: Normal rate.     Heart sounds: Murmur heard.   Pulmonary:     Effort: Pulmonary effort is normal.  Abdominal:     General: Bowel sounds  are normal.  Musculoskeletal:     Cervical back: Normal range of motion and neck supple.     Right lower leg: Edema present.     Left lower leg: Edema present.  Skin:    General: Skin is warm and dry.  Neurological:     Mental Status: She is alert and oriented to person, place, and time.     ED Results / Procedures / Treatments   Labs (all labs ordered are listed, but only abnormal results are displayed) Labs Reviewed  BASIC METABOLIC PANEL - Abnormal; Notable for the following components:      Result Value   Potassium 3.4 (*)    Chloride 92 (*)    Glucose, Bld 112 (*)    BUN 61 (*)    Creatinine, Ser 2.20 (*)    Calcium 8.7 (*)    GFR calc non Af Amer 20 (*)    GFR calc Af Amer 24 (*)    All other components within normal limits  MAGNESIUM - Abnormal; Notable for the following components:   Magnesium 2.5 (*)    All other components within normal limits  BRAIN NATRIURETIC PEPTIDE - Abnormal; Notable for the following components:   B Natriuretic Peptide 565.0 (*)    All other components within normal limits  CBC - Abnormal; Notable for the following components:   RBC 2.70 (*)    Hemoglobin 9.5 (*)    HCT 30.8 (*)    MCV 114.1 (*)    MCH 35.2 (*)    RDW 16.4 (*)     Platelets 145 (*)    All other components within normal limits  PROTIME-INR - Abnormal; Notable for the following components:   Prothrombin Time 31.7 (*)    INR 3.2 (*)    All other components within normal limits  AMMONIA - Abnormal; Notable for the following components:   Ammonia 53 (*)    All other components within normal limits  SARS CORONAVIRUS 2 BY RT PCR [facility] ORDER, [place] [facility])    EKG EKG Interpretation  Date/Time:  Thursday October 23 2019 09:53:16 EDT Ventricular Rate:  68 PR Interval:    QRS Duration: 133 QT Interval:  500 QTC Calculation: 532 R Axis:   141 Text Interpretation: Atrial fibrillation Nonspecific intraventricular conduction delay Borderline repolarization abnormality No acute changes No significant change since last tracing Confirmed by Varney Biles [phone]) on 10/23/2019 11:00:16 AM   Radiology DG Chest Portable 1 View  Result Date: 10/23/2019 CLINICAL DATA:  Evaluate for pulmonary edema EXAM: PORTABLE CHEST 1 VIEW COMPARISON:  10/14/2019 FINDINGS: Cardiomegaly. Prior median sternotomy. Moderate right pleural effusion with right lower lobe atelectasis. Areas of scarring in the lungs bilaterally. No overt edema. No acute bony abnormality. IMPRESSION: Stable moderate right effusion with right base atelectasis. Cardiomegaly. Chronic changes.  No overt edema. Electronically Signed   By: Rolm Baptise M.D.   On: 10/23/2019 11:00    Procedures Procedures (including critical care time)  Medications Ordered in ED Medications  furosemide (LASIX) injection 80 mg (80 mg Intravenous Given 10/23/19 1135)  potassium chloride SA (KLOR-CON) CR tablet 40 mEq (40 mEq Oral Given 10/23/19 1135)    ED Course  I have reviewed the triage vital signs and the nursing notes.  Pertinent labs & imaging results that were available during my care of the patient were reviewed by me and considered in my medical decision making (see chart for  details).    MDM Rules/Calculators/A&P  Pt with orthopnea / PND. Has hx of CHF - getting worse. Has acute on chronic renal failure.  We will admit to the hospital.  80 of IV Lasix given in the ED.  Final Clinical Impression(s) / ED Diagnoses Final diagnoses:  Acute combined systolic and diastolic congestive heart failure [facility])    Rx / DC Orders ED Discharge Orders    None       Varney Biles, MD 10/23/19 1550

## 2019-10-24 ENCOUNTER — Inpatient Hospital Stay (HOSPITAL_COMMUNITY): Payer: Medicare Other

## 2019-10-24 ENCOUNTER — Encounter (HOSPITAL_COMMUNITY): Payer: Self-pay | Admitting: Family Medicine

## 2019-10-24 DIAGNOSIS — I5033 Acute on chronic diastolic (congestive) heart failure: Secondary | ICD-10-CM

## 2019-10-24 DIAGNOSIS — I50810 Right heart failure, unspecified: Secondary | ICD-10-CM

## 2019-10-24 DIAGNOSIS — G4733 Obstructive sleep apnea (adult) (pediatric): Secondary | ICD-10-CM

## 2019-10-24 LAB — PROTEIN, PLEURAL OR PERITONEAL FLUID: Total protein, fluid: 3.9 g/dL

## 2019-10-24 LAB — LACTATE DEHYDROGENASE, PLEURAL OR PERITONEAL FLUID: LD, Fluid: 98 U/L — ABNORMAL HIGH (ref 3–23)

## 2019-10-24 LAB — HEMOGLOBIN A1C
Hgb A1c MFr Bld: 5.1 % (ref 4.8–5.6)
Mean Plasma Glucose: 99.67 mg/dL

## 2019-10-24 LAB — BODY FLUID CELL COUNT WITH DIFFERENTIAL
Eos, Fluid: 1 %
Lymphs, Fluid: 41 %
Monocyte-Macrophage-Serous Fluid: 49 % — ABNORMAL LOW (ref 50–90)
Neutrophil Count, Fluid: 9 % (ref 0–25)
Other Cells, Fluid: 1 %
Total Nucleated Cell Count, Fluid: 107 cu mm (ref 0–1000)

## 2019-10-24 LAB — BASIC METABOLIC PANEL
Anion gap: 13 (ref 5–15)
BUN: 61 mg/dL — ABNORMAL HIGH (ref 8–23)
CO2: 30 mmol/L (ref 22–32)
Calcium: 8.6 mg/dL — ABNORMAL LOW (ref 8.9–10.3)
Chloride: 93 mmol/L — ABNORMAL LOW (ref 98–111)
Creatinine, Ser: 2.18 mg/dL — ABNORMAL HIGH (ref 0.44–1.00)
GFR calc Af Amer: 24 mL/min — ABNORMAL LOW (ref >60)
GFR calc non Af Amer: 21 mL/min — ABNORMAL LOW (ref >60)
Glucose, Bld: 88 mg/dL (ref 70–99)
Potassium: 3.5 mmol/L (ref 3.5–5.1)
Sodium: 136 mmol/L (ref 135–145)

## 2019-10-24 LAB — CBC
HCT: 28.7 % — ABNORMAL LOW (ref 36.0–46.0)
Hemoglobin: 9.1 g/dL — ABNORMAL LOW (ref 12.0–15.0)
MCH: 35.7 pg — ABNORMAL HIGH (ref 26.0–34.0)
MCHC: 31.7 g/dL (ref 30.0–36.0)
MCV: 112.5 fL — ABNORMAL HIGH (ref 80.0–100.0)
Platelets: 137 10*3/uL — ABNORMAL LOW (ref 150–400)
RBC: 2.55 MIL/uL — ABNORMAL LOW (ref 3.87–5.11)
RDW: 16.5 % — ABNORMAL HIGH (ref 11.5–15.5)
WBC: 4.4 10*3/uL (ref 4.0–10.5)
nRBC: 0 % (ref 0.0–0.2)

## 2019-10-24 LAB — GRAM STAIN

## 2019-10-24 LAB — GLUCOSE, CAPILLARY
Glucose-Capillary: 86 mg/dL (ref 70–99)
Glucose-Capillary: 134 mg/dL — ABNORMAL HIGH (ref 70–99)
Glucose-Capillary: 148 mg/dL — ABNORMAL HIGH (ref 70–99)

## 2019-10-24 LAB — GLUCOSE, PLEURAL OR PERITONEAL FLUID: Glucose, Fluid: 144 mg/dL

## 2019-10-24 LAB — PROTIME-INR
INR: 3.1 — ABNORMAL HIGH (ref 0.8–1.2)
Prothrombin Time: 31.3 seconds — ABNORMAL HIGH (ref 11.4–15.2)

## 2019-10-24 MED ORDER — POLYETHYLENE GLYCOL 3350 17 G PO PACK
17.0000 g | PACK | Freq: Every day | ORAL | Status: DC
  Administered 2019-10-24: 17 g via ORAL
  Filled 2019-10-24: qty 1

## 2019-10-24 MED ORDER — SPIRONOLACTONE 25 MG PO TABS: 25.0000 mg | ORAL_TABLET | Freq: Every day | ORAL | Status: DC

## 2019-10-24 MED ORDER — LACTULOSE 10 GM/15ML PO SOLN
20.0000 g | Freq: Every day | ORAL | Status: DC
  Administered 2019-10-25 – 2019-11-02 (×9): 20 g via ORAL
  Filled 2019-10-24 (×9): qty 30

## 2019-10-24 NOTE — Progress Notes (Signed)
ANTICOAGULATION CONSULT NOTE -  Pharmacy Consult for warfarin Indication: mechanical AVR and afib  Allergies  Allergen Reactions  . Tape Itching    Redness, Please use "paper" tape only    Patient Measurements: Height: 5\' 6"  (167.6 cm) Weight: 72.7 kg (160 lb 3.2 oz) IBW/kg (Calculated) : 59.3  Vital Signs: Temp: 98.1 F (36.7 C) (08/06 0800) Temp Source: Oral (08/06 0800) BP: 122/65 (08/06 0800) Pulse Rate: 92 (08/06 0800)  Labs: Recent Labs    10/23/19 1103 10/24/19 0748  HGB 9.5* 9.1*  HCT 30.8* 28.7*  PLT 145* 137*  LABPROT 31.7* 31.3*  INR 3.2* 3.1*  CREATININE 2.20*  --     Estimated Creatinine Clearance: 20.5 mL/min (A) (by C-G formula based on SCr of 2.2 mg/dL (H)).   Medical History: Past Medical History:  Diagnosis Date  . Anxiety   . Diabetes mellitus with neuropathy ([place])   . Diabetic neuropathy ([place])   . DJD (degenerative joint disease)   . H/O aortic valve replacement   . Hyperlipidemia   . Hypertension   . Neuromuscular disorder (HCC)    neuropathy in feet  . Obesity   . Sleep apnea   . Stroke [facility])    " light stroke"  . Wears glasses     Medications:  PTA medications include warfarin 2 mg po qday  Assessment: Pharmacy consulted to dose warfarin for this patient with a history of mechanical AVR and afib.  Last warfarin 2 mg dose was 10/23/19.  Goal of Therapy:  INR 2.5-3.5 Monitor platelets by anticoagulation protocol: Yes   Plan:  Continue warfarin 2 mg daily Monitor daily INR for warfarin dose adjustments, CBC, s/s bleeding   Margot Ables, PharmD Clinical Pharmacist 10/24/2019 10:47 AM

## 2019-10-24 NOTE — Progress Notes (Addendum)
Progress Note  Patient Name: Mary [name] Date of Encounter: 10/24/2019  Primary Cardiologist: Carlyle Dolly, MD   Subjective   She feels like her breathing has already started to improve. Still with orthopnea and significant lower extremity edema.   Inpatient Medications    Scheduled Meds: . fluticasone  1 spray Each Nare QHS  . furosemide  80 mg Intravenous Q12H  . HYDROcodone-acetaminophen  1 tablet Oral BID  . insulin aspart  0-5 Units Subcutaneous QHS  . insulin aspart  0-6 Units Subcutaneous TID WC  . lactulose  30 g Oral Daily  . loratadine  10 mg Oral Daily  . metoprolol succinate  100 mg Oral q morning - 10a  . pantoprazole  40 mg Oral QAC breakfast  . polyethylene glycol  17 g Oral Daily  . potassium chloride  10 mEq Oral q morning - 10a  . simvastatin  40 mg Oral Q2000  . sodium chloride flush  3 mL Intravenous Q12H  . vitamin B-12  500 mcg Oral Daily  . [START ON 10/26/2019] Vitamin D (Ergocalciferol)  50,000 Units Oral Q Sun  . warfarin  2 mg Oral q1600  . Warfarin - Pharmacist Dosing Inpatient   Does not apply q1600   Continuous Infusions: . sodium chloride     PRN Meds: sodium chloride, acetaminophen, albuterol, ALPRAZolam, bisacodyl, ondansetron **OR** ondansetron (ZOFRAN) IV, polyethylene glycol, sodium chloride flush, traZODone   Vital Signs    Vitals:   10/23/19 2038 10/24/19 0107 10/24/19 0452 10/24/19 0800  BP: (!) 109/58 (!) 109/48 112/61 122/65  Pulse: 79 72 79 92  Resp:  20  18  Temp: 98.1 F (36.7 C)  (!) 97.4 F (36.3 C) 98.1 F (36.7 C)  TempSrc: Oral  Oral Oral  SpO2: 96% 92% 92% 96%  Weight:      Height:        Intake/Output Summary (Last 24 hours) at 10/24/2019 0917 Last data filed at 10/24/2019 0725 Gross per 24 hour  Intake 240 ml  Output 2200 ml  Net -1960 ml    Last 3 Weights 10/23/2019 10/23/2019 10/14/2019  Weight (lbs) 160 lb 3.2 oz 162 lb 158 lb 11.7 oz  Weight (kg) 72.666 kg 73.483 kg 72 kg      Telemetry    Atrial  fibrillation, HR in 70's with occasional PVC's. - Personally Reviewed  ECG    Atrial fibrillation, HR 68 with IVCD.  - Personally Reviewed  Physical Exam   General: Well developed, elderly female appearing in no acute distress. Head: Normocephalic, atraumatic.  Neck: Supple without bruits, JVD at 9 cm. Lungs:  Resp regular and unlabored, rales along bases bilaterally. Heart: Irregularly irregular, S1, S2, no S3, S4, 2/6 SEM along RUSB. Mechanical valve sounds noted. Abdomen: Soft, non-tender, non-distended with normoactive bowel sounds. No hepatomegaly. No rebound/guarding. No obvious abdominal masses. Extremities: No clubbing or cyanosis, 1+ pitting edema bilaterally. Distal pedal pulses are 2+ bilaterally. Neuro: Alert and oriented X 3. Moves all extremities spontaneously. Psych: Normal affect.  Labs    Chemistry Recent Labs  Lab 10/20/19 1043 10/23/19 1103  NA 135 136  K 3.2* 3.4*  CL 92* 92*  CO2 31 30  GLUCOSE 145* 112*  BUN 60* 61*  CREATININE 2.37* 2.20*  CALCIUM 8.6* 8.7*  PROT 8.0  --   ALBUMIN 2.6*  --   AST 24  --   ALT 9  --   ALKPHOS 67  --   BILITOT 1.5*  --  GFRNONAA 19* 20*  GFRAA 22* 24*  ANIONGAP 12 14     Hematology Recent Labs  Lab 10/20/19 1043 10/23/19 1103 10/24/19 0748  WBC 4.2 4.6 4.4  RBC 2.63* 2.70* 2.55*  HGB 9.4* 9.5* 9.1*  HCT 29.9* 30.8* 28.7*  MCV 113.7* 114.1* 112.5*  MCH 35.7* 35.2* 35.7*  MCHC 31.4 30.8 31.7  RDW 16.5* 16.4* 16.5*  PLT 143* 145* 137*    Cardiac EnzymesNo results for input(s): TROPONINI in the last 168 hours. No results for input(s): TROPIPOC in the last 168 hours.   BNP Recent Labs  Lab 10/23/19 1103  BNP 565.0*     DDimer No results for input(s): DDIMER in the last 168 hours.   Radiology    US Abdomen Complete  Result Date: 10/24/2019 CLINICAL DATA:  Abdominal distension EXAM: ABDOMEN ULTRASOUND COMPLETE COMPARISON:  03/27/2016 FINDINGS: Gallbladder: Gallbladder is well distended without  gallbladder wall thickening. Cholelithiasis and gallbladder sludge is seen. Common bile duct: Diameter: 3.1 mm. Liver: Liver is shrunken with a nodular appearance. No focal mass lesion is noted. Stable cyst is noted in the left lobe of the liver measuring 1.8 cm in greatest dimension. Portal vein is patent on color Doppler imaging with normal direction of blood flow towards the liver. IVC: No abnormality visualized. Pancreas: Visualized portion unremarkable. Spleen: Size and appearance within normal limits. Right Kidney: Length: 7.9 cm. Slight increased echogenicity is noted. Scarring is also noted in the upper pole of the right kidney. No obstructive changes are seen. Left Kidney: Length: 9.4 cm. Echogenicity within normal limits. No mass or hydronephrosis visualized. Abdominal aorta: No aneurysm visualized. Other findings: Ascites is noted. Additionally a right-sided pleural effusion is noted. IMPRESSION: Changes consistent with cirrhosis of the liver with associated ascites. Right-sided pleural effusion. Left hepatic cyst stable from the prior study. Cholelithiasis and gallbladder sludge without complicating factors. Electronically Signed   By: Inez Catalina M.D.   On: 10/24/2019 09:02   DG Chest Portable 1 View  Result Date: 10/23/2019 CLINICAL DATA:  Evaluate for pulmonary edema EXAM: PORTABLE CHEST 1 VIEW COMPARISON:  10/14/2019 FINDINGS: Cardiomegaly. Prior median sternotomy. Moderate right pleural effusion with right lower lobe atelectasis. Areas of scarring in the lungs bilaterally. No overt edema. No acute bony abnormality. IMPRESSION: Stable moderate right effusion with right base atelectasis. Cardiomegaly. Chronic changes.  No overt edema. Electronically Signed   By: Rolm Baptise M.D.   On: 10/23/2019 11:00   ECHOCARDIOGRAM COMPLETE  Result Date: 10/23/2019    ECHOCARDIOGRAM REPORT   Patient Name:   Mary [name] Date of Exam: 10/23/2019 Medical Rec #:  [id number]   Height:       66.0 in Accession #:     [id number]  Weight:       160.2 lb Date of Birth:  04-21-1940   BSA:          1.820 m Patient Age:    79 years    BP:           119/65 mmHg Patient Gender: F           HR:           71 bpm. Exam Location:  [facility] Procedure: 2D Echo Indications:    Dyspnea 786.09 / R06.00  History:        Patient has prior history of Echocardiogram examinations, most                 recent 04/24/2019. Stroke, Arrythmias:Atrial Fibrillation; Risk  Factors:Non-Smoker, Dyslipidemia, Diabetes and Hypertension.                 CKD.  Sonographer:    Leavy Cella RDCS (AE) Referring Phys: [id number] [place]  1. Left ventricular ejection fraction, by estimation, is 55 to 60%. The left ventricle has normal function. The left ventricle has no regional wall motion abnormalities. Left ventricular diastolic parameters are indeterminate.  2. Right ventricular systolic function is severely reduced. The right ventricular size is severely enlarged. There is moderately elevated pulmonary artery systolic pressure.  3. Left atrial size was moderately dilated.  4. Right atrial size was severely dilated.  5. The mitral valve is normal in structure. Mild mitral valve regurgitation. No evidence of mitral stenosis.  6. The tricuspid valve is abnormal. Tricuspid valve regurgitation is severe.  7. There is a mechanical valve in the AV position. From medical record St Jude valve, unknown type and size. . The aortic valve has been repaired/replaced. Aortic valve regurgitation is not visualized. No aortic stenosis is present.  8. Severe pulmonary HTN, PASP is 65 mmHg.  9. The inferior vena cava is dilated in size with <50% respiratory variability, suggesting right atrial pressure of 15 mmHg. FINDINGS  Left Ventricle: Left ventricular ejection fraction, by estimation, is 55 to 60%. The left ventricle has normal function. The left ventricle has no regional wall motion abnormalities. The left ventricular internal cavity size  was small. There is no left ventricular hypertrophy. Left ventricular diastolic parameters are indeterminate. Right Ventricle: The right ventricular size is severely enlarged. No increase in right ventricular wall thickness. Right ventricular systolic function is severely reduced. There is moderately elevated pulmonary artery systolic pressure. The tricuspid regurgitant velocity is 3.46 m/s, and with an assumed right atrial pressure of 10 mmHg, the estimated right ventricular systolic pressure is 55.7 mmHg. Left Atrium: Left atrial size was moderately dilated. Right Atrium: Right atrial size was severely dilated. Pericardium: There is no evidence of pericardial effusion. Mitral Valve: The mitral valve is normal in structure. There is mild thickening of the mitral valve leaflet(s). There is mild calcification of the mitral valve leaflet(s). Mild mitral annular calcification. Mild mitral valve regurgitation. No evidence of  mitral valve stenosis. Tricuspid Valve: The tricuspid valve is abnormal. Tricuspid valve regurgitation is severe. No evidence of tricuspid stenosis. Aortic Valve: There is a mechanical valve in the AV position. From medical record St Jude valve, unknown type and size. The aortic valve has been repaired/replaced. Aortic valve regurgitation is not visualized. No aortic stenosis is present. Aortic valve  mean gradient measures 3.8 mmHg. Aortic valve peak gradient measures 6.4 mmHg. Pulmonic Valve: The pulmonic valve was not well visualized. Pulmonic valve regurgitation is mild to moderate. No evidence of pulmonic stenosis. Aorta: The aortic root is normal in size and structure. Pulmonary Artery: Severe pulmonary HTN, PASP is 65 mmHg. Venous: The inferior vena cava is dilated in size with less than 50% respiratory variability, suggesting right atrial pressure of 15 mmHg. IAS/Shunts: The interatrial septum was not well visualized.  LEFT VENTRICLE PLAX 2D LVIDd:         4.01 cm Diastology LVIDs:          3.50 cm LV e' lateral:   7.97 cm/s LV PW:         1.13 cm LV E/e' lateral: 14.1 LV IVS:        0.70 cm LV e' medial:    7.31 cm/s  LV E/e' medial:  15.3  RIGHT VENTRICLE RV S prime:     4.05 cm/s TAPSE (M-mode): 0.8 cm LEFT ATRIUM             Index       RIGHT ATRIUM           Index LA diam:        3.60 cm 1.98 cm/m  RA Area:     18.80 cm LA Vol (A2C):   69.0 ml 37.92 ml/m RA Volume:   54.60 ml  30.00 ml/m LA Vol (A4C):   59.8 ml 32.86 ml/m LA Biplane Vol: 64.9 ml 35.66 ml/m  AORTIC VALVE AV Vmax:           126.67 cm/s AV Vmean:          93.430 cm/s AV VTI:            0.268 m AV Peak Grad:      6.4 mmHg AV Mean Grad:      3.8 mmHg LVOT Vmax:         64.97 cm/s LVOT Vmean:        44.995 cm/s LVOT VTI:          0.146 m LVOT/AV VTI ratio: 0.55  AORTA Ao Root diam: 2.30 cm MITRAL VALVE                TRICUSPID VALVE MV Area (PHT): 3.33 cm     TR Peak grad:   47.9 mmHg MV Decel Time: 228 msec     TR Vmax:        346.00 cm/s MV E velocity: 112.00 cm/s                             SHUNTS                             Systemic VTI: 0.15 m Carlyle Dolly MD Electronically signed by Carlyle Dolly MD Signature Date/Time: 10/23/2019/3:56:43 PM    Final     Cardiac Studies   Echocardiogram: 10/23/2019 IMPRESSIONS    1. Left ventricular ejection fraction, by estimation, is 55 to 60%. The  left ventricle has normal function. The left ventricle has no regional  wall motion abnormalities. Left ventricular diastolic parameters are  indeterminate.  2. Right ventricular systolic function is severely reduced. The right  ventricular size is severely enlarged. There is moderately elevated  pulmonary artery systolic pressure.  3. Left atrial size was moderately dilated.  4. Right atrial size was severely dilated.  5. The mitral valve is normal in structure. Mild mitral valve  regurgitation. No evidence of mitral stenosis.  6. The tricuspid valve is abnormal. Tricuspid valve regurgitation  is  severe.  7. There is a mechanical valve in the AV position. From medical record St  Jude valve, unknown type and size. . The aortic valve has been  repaired/replaced. Aortic valve regurgitation is not visualized. No aortic  stenosis is present.  8. Severe pulmonary HTN, PASP is 65 mmHg.  9. The inferior vena cava is dilated in size with <50% respiratory  variability, suggesting right atrial pressure of 15 mmHg.   Patient Profile     79 y.o. female w/ PMH of chronic diastolic CHF, persistent atrial fibrillation, mechanical AVR in 1995, HTN, HLD, and OSA who was sent to the Emergency Department from the office on 10/23/2019 for an  acute CHF exacerbation and further evaluation of her cirrhosis.   Assessment & Plan    1. Acute on Chronic Diastolic CHF - She was taking Torsemide 80mg  BID and Metolazone 10mg  daily per directions from her PCP prior to admission and weight was still increasing (at 162 lbs in the office).  - BNP elevated to 565 and CXR showed stable moderate right pleural effusion. Repeat echo shows a preserved EF of 55-60% with no regional WMA. Severe pulmonary HTN. Received IV Lasix 80mg  x1 yesterday and has been started on IV Lasix 80mg  BID. Recorded output of -1.9 L thus far. Daily weight not recorded. Continue IV Lasix at current dosing. Will add BMET on to AM labs.   2. Persistent Atrial Fibrillation - HR has been well-controlled in the 60's to 70's by review of telemetry. Remains on Toprol-XL 100mg  daily for rate-control. On Coumadin for anticoagulation.  3. Aortic Valve Replacement - She is s/p mechanical AVR in 1995. Repeat echo this admission shows no evidence of regurgitation or stenosis. Remains on Coumadin for anticoagulation. INR 3.1 this AM. Appreciate pharmacy's assistance with dosing.   4. Pulmonary HTN - PASP elevated to 65 m Hg by echo this admission. Noncompliant with CPAP as an outpatient.   5. Cirrhosis - Abdominal US shows changes consistent with  cirrhosis of the liver with associated ascites. She has been started on Lactulose. Further management per admitting team.   6. AKI - Baseline creatinine 1.3 - 1.4. Elevated to 2.37 earlier this week, at 2.20 on admission. Will add-on BMET to AM labs.   For questions or updates, please contact [place] Please consult [email or web address] for contact info under Cardiology/STEMI.   Arna Medici , PA-C 9:17 AM 10/24/2019 Pager: [phone]  Attending note Patient seen and discussed with PA Strader. I agree with her documentation. Admitted with diastolic HF complicated by severe RV dysfunction. Significantly volume overloaded with abdominal distension and LE edema. History of cirrhosis without clear workup in the past, defer to primary team further evaluation and potential paracentesis. Of note normal RV function by 2017 echo and CT at that time with cirrhosis so would not solely attribute this to RV dysfunction.  Failed very aggressive home diuretic regimen of torsemide 80mg  bid and metolazone 10mg  daily. Neg 1.5 L yesterday, she is on IV lasix 80mg  bid. Downtrend in Cr with diuresis consistnet with venous congestion and CHF. COntinue IV diuretics today. Once more euvolemic would consider RHC and potential evaluation by advanced HF team.   Looking back echo 10/2015 normal RV function. During admission 04/2019 had echo which showed mod RV dysfunction, echo this admit severe RV dysfunction. She has OSA and has been noncompliant. Evidence of left sided diastolic HF. Evidence of cirrhosis. All likely playing a role in her pulm HTN and RV failure.   Over the weekend if continues to diurese well would stay at [facility], would plan for transfer next week for RHC. If difficultly diuresing over the weekend would consider transfer to [facility] for HF team evaluation. So far has responded well to IV lasix.   Carlyle Dolly MD

## 2019-10-24 NOTE — Progress Notes (Signed)
Paracentesis complete no signs of distress.  

## 2019-10-24 NOTE — Care Management Important Message (Signed)
Important Message  Patient Details  Name: Mary [name] MRN: [id number] Date of Birth: 1941-03-19   Medicare Important Message Given:  Yes     Tommy Medal 10/24/2019, 1:56 PM

## 2019-10-24 NOTE — Progress Notes (Signed)
Patient Demographics:    Mary [name], is a 79 y.o. female, DOB - 1940-09-09, XLK:[id number]  Admit date - 10/23/2019   Admitting Physician Jaxzen Vanhorn Denton Brick, MD  Outpatient Primary MD for the patient is [name], MD  LOS - 1   Chief Complaint  Patient presents with  . Shortness of Breath        Subjective:    Mary [name] today has no fevers, no emesis,  No chest pain,   --Orthopnea, dyspnea on exertion persist -Voiding okay -Husband at bedside, questions answered  Assessment  & Plan :    Principal Problem:   Acute exacerbation of dCHF (congestive heart failure) (HCC) Active Problems:   Essential hypertension   CVA (cerebral vascular accident) ([place])   S/P aortic valve replacement   Diabetes (HCC)   OSA (obstructive sleep apnea)   CKD (chronic kidney disease), stage III  Brief Summary:- 79 y.o. female with past medical history relevant for diastolic dysfunction CHF, HTN, status post aortic valve replacement/mechanical Saint Jude valve, 1995/1996, paroxysmal atrial fibrillation, history of prior stroke, history of DM, CKD 3b, OSA admitted on 10/23/2019 with acute on chronic diastolic dysfunction CHF exacerbation and ascites secondary to liver cirrhosis as well as AKI having failed outpatient attempt at oral diuresis   A/p 1)HFpEF/Acute on Chronic Diastolic Dysfunction CHF Exac--- in the Setting of pulmonary HTN and RV dysfunction - 04/2019 echo LVEF 60-65%, elevated LA pressure, moderate RV dysfunction, severe RVE, severe biatrial enlargement, mod MR, severe TR, severe pulm HTN -Repeat echo on 10/23/2019 with EF down to 55 to 60%, with severe pulmonary hypertension PASP of 65 -BNP 565 -PTA Patient continued to have with overload issues despite taking torsemide 80mg  bid, metolazone 10mg  as needed--- her creatinine was trending up with attempt at diuresis as outpatient, she also developed  ascites-- -continue Lasix IV 80 mg twice a day, daily weights and fluid input and output monitoring ---May benefit from transfer to [facility] for RHC once volume status improves  2) status post aortic valve replacement with [facility] Jude AVR in 1995/1996--- continue Coumadin therapy for mechanical valve--echo without significant valvular problems at this time -Pharmacy to help manage Coumadin therapy  3)OSA--- history of noncompliance with CPAP  4) history of paroxysmal A. fib and prior CVA--- on Coumadin as above #1, continue metoprolol for rate control  5) history of cirrhosis on prior imaging studies--- does not appear to have had work-up for cirrhosis, abdominal ultrasound consistent with liver cirrhosis with ascites  =-Ammonia is 53 , no significant encephalopathy--- lactulose prophylactically --Status post paracentesis on 10/24/2018 for removal of 4.2 L--studies pending -Viral hepatitis profile pending, Lipid profile and TSH pending -Consider adding Aldactone to Lasix if renal function improves  6)AKI----acute kidney injury on CKD stage - IIIb    creatinine on admission=2.20  , baseline creatinine =1.4     -Repeat creatinine today 2.18 ---renally adjust medications, avoid nephrotoxic agents / dehydration  / hypotension -Monitor renal function closely with aggressive diuresis  7) chronic anemia----Hgb currently stable >> 9 which is patient's baseline  8)DM2- Use Novolog/Humalog Sliding scale insulin with Accu-Cheks/Fingersticks as ordered    Disposition/Need for in-Hospital Stay- patient unable to be discharged at this time due to --CHF exacerbation and worsening renal function  requiring IV diuresis and close monitoring of renal function while diuresing patient, failed outpatient oral diuretics  Status is: Inpatient  Remains inpatient appropriate because:HF exacerbation and worsening renal function requiring IV diuresis and close monitoring of renal function while diuresing  patient, failed outpatient oral diuretics   Dispo: The patient is from: Home  Anticipated d/c is to: Home  Anticipated d/c date is: 2 days  Patient currently is not medically stable to d/c. Barriers: Not Clinically Stable- HF exacerbation and worsening renal function requiring IV diuresis and close monitoring of renal function while diuresing patient, failed outpatient oral diuretics  Code Status : Full  Family Communication:   (patient is alert, awake and coherent)  =-Discussed with husband at bedside  Consults  :  cardiology  Procedures:- Status post paracentesis on 10/24/2018 for removal of 4.2 L--  DVT Prophylaxis  : Coumadin- SCDs   Lab Results  Component Value Date   PLT 137 (L) 10/24/2019    Inpatient Medications  Scheduled Meds: . fluticasone  1 spray Each Nare QHS  . furosemide  80 mg Intravenous Q12H  . HYDROcodone-acetaminophen  1 tablet Oral BID  . insulin aspart  0-5 Units Subcutaneous QHS  . insulin aspart  0-6 Units Subcutaneous TID WC  . lactulose  30 g Oral Daily  . loratadine  10 mg Oral Daily  . metoprolol succinate  100 mg Oral q morning - 10a  . pantoprazole  40 mg Oral QAC breakfast  . polyethylene glycol  17 g Oral Daily  . potassium chloride  10 mEq Oral q morning - 10a  . simvastatin  40 mg Oral Q2000  . sodium chloride flush  3 mL Intravenous Q12H  . spironolactone  25 mg Oral Daily  . vitamin B-12  500 mcg Oral Daily  . [START ON 10/26/2019] Vitamin D (Ergocalciferol)  50,000 Units Oral Q Sun  . warfarin  2 mg Oral q1600  . Warfarin - Pharmacist Dosing Inpatient   Does not apply q1600   Continuous Infusions: . sodium chloride     PRN Meds:.sodium chloride, acetaminophen, albuterol, ALPRAZolam, bisacodyl, ondansetron **OR** ondansetron (ZOFRAN) IV, polyethylene glycol, sodium chloride flush, traZODone    Anti-infectives (From admission, onward)   None        Objective:   Vitals:   10/24/19 0800  10/24/19 1353 10/24/19 1432 10/24/19 1456  BP: 122/65 118/66 (!) 114/52 (!) 101/50  Pulse: 92 78 76 71  Resp: 18 18 18 18   Temp: 98.1 F (36.7 C)   98.1 F (36.7 C)  TempSrc: Oral     SpO2: 96% 97% 98% 100%  Weight:      Height:        Wt Readings from Last 3 Encounters:  10/23/19 72.7 kg  10/23/19 73.5 kg  10/14/19 72 kg     Intake/Output Summary (Last 24 hours) at 10/24/2019 1928 Last data filed at 10/24/2019 1700 Gross per 24 hour  Intake 960 ml  Output 2400 ml  Net -1440 ml     Physical Exam  Gen:- Awake Alert,  in no apparent distress  HEENT:- [place].AT, No sclera icterus Neck-Supple Neck,No JVD,.  Lungs-diminished breath sounds, bibasilar rales  CV- S1, S2 normal, regular , prior sternotomy scar Abd-  +ve B.Sounds, Abd Soft, No tenderness, much less distended after paracentesis    Extremity/Skin:-Trace to +1 edema, pedal pulses present  Psych-affect is appropriate, oriented x3 Neuro-generalized weakness, no new focal deficits, no tremors   Data Review:   Micro Results  Recent Results (from the past 240 hour(s))  SARS Coronavirus 2 by RT PCR (hospital order, performed in [facility] hospital lab) Nasopharyngeal Nasopharyngeal Swab     Status: None   Collection Time: 10/23/19 11:52 AM   Specimen: Nasopharyngeal Swab  Result Value Ref Range Status   SARS Coronavirus 2 NEGATIVE NEGATIVE Final    Comment: (NOTE) SARS-CoV-2 target nucleic acids are NOT DETECTED.  The SARS-CoV-2 RNA is generally detectable in upper and lower respiratory specimens during the acute phase of infection. The lowest concentration of SARS-CoV-2 viral copies this assay can detect is 250 copies / mL. A negative result does not preclude SARS-CoV-2 infection and should not be used as the sole basis for treatment or other patient management decisions.  A negative result may occur with improper specimen collection / handling, submission of specimen other than nasopharyngeal swab, presence of  viral mutation(s) within the areas targeted by this assay, and inadequate number of viral copies (<250 copies / mL). A negative result must be combined with clinical observations, patient history, and epidemiological information.  Fact Sheet for Patients:   StrictlyIdeas.no  Fact Sheet for Healthcare Providers: BankingDealers.co.za  This test is not yet approved or  cleared by the [place] FDA and has been authorized for detection and/or diagnosis of SARS-CoV-2 by FDA under an Emergency Use Authorization (EUA).  This EUA will remain in effect (meaning this test can be used) for the duration of the COVID-19 declaration under Section 564(b)(1) of the Act, 21 U.S.C. section 360bbb-3(b)(1), unless the authorization is terminated or revoked sooner.  Performed at [facility], [address] [place], [place] [address]   Gram stain     Status: None   Collection Time: 10/24/19  2:22 PM   Specimen: Ascitic; Body Fluid  Result Value Ref Range Status   Specimen Description ASCITIC  Final   Special Requests NONE  Final   Gram Stain   Final    CYTOSPIN SMEAR NO ORGANISMS SEEN WBC PRESENT, PREDOMINANTLY MONONUCLEAR Performed at [facility], [address], [place],  [address]    Report Status 10/24/2019 FINAL  Final    Radiology Reports DG Chest 2 View  Result Date: 10/14/2019 CLINICAL DATA:  79 year old female with shortness of breath. EXAM: CHEST - 2 VIEW COMPARISON:  Chest radiograph dated 04/23/2019. FINDINGS: Small right pleural effusion with right lung base atelectasis or infiltrate similar to prior radiograph. There is background of emphysema. No pneumothorax. There is mild cardiomegaly with probable mild vascular congestion. Median sternotomy wires. Atherosclerotic calcification of the aorta. No acute osseous pathology. IMPRESSION: 1. Stable right pleural effusion with right lung base atelectasis or infiltrate. 2. Cardiomegaly  with probable mild vascular congestion. Electronically Signed   By: Anner Crete M.D.   On: 10/14/2019 18:10   US Abdomen Complete  Result Date: 10/24/2019 CLINICAL DATA:  Abdominal distension EXAM: ABDOMEN ULTRASOUND COMPLETE COMPARISON:  03/27/2016 FINDINGS: Gallbladder: Gallbladder is well distended without gallbladder wall thickening. Cholelithiasis and gallbladder sludge is seen. Common bile duct: Diameter: 3.1 mm. Liver: Liver is shrunken with a nodular appearance. No focal mass lesion is noted. Stable cyst is noted in the left lobe of the liver measuring 1.8 cm in greatest dimension. Portal vein is patent on color Doppler imaging with normal direction of blood flow towards the liver. IVC: No abnormality visualized. Pancreas: Visualized portion unremarkable. Spleen: Size and appearance within normal limits. Right Kidney: Length: 7.9 cm. Slight increased echogenicity is noted. Scarring is also noted in the upper pole  of the right kidney. No obstructive changes are seen. Left Kidney: Length: 9.4 cm. Echogenicity within normal limits. No mass or hydronephrosis visualized. Abdominal aorta: No aneurysm visualized. Other findings: Ascites is noted. Additionally a right-sided pleural effusion is noted. IMPRESSION: Changes consistent with cirrhosis of the liver with associated ascites. Right-sided pleural effusion. Left hepatic cyst stable from the prior study. Cholelithiasis and gallbladder sludge without complicating factors. Electronically Signed   By: Inez Catalina M.D.   On: 10/24/2019 09:02   US Paracentesis  Result Date: 10/24/2019 INDICATION: Cirrhosis, ascites EXAM: ULTRASOUND GUIDED DIAGNOSTIC AND THERAPEUTIC PARACENTESIS MEDICATIONS: None COMPLICATIONS: None immediate PROCEDURE: Informed written consent was obtained from the patient after a discussion of the risks, benefits and alternatives to treatment. A timeout was performed prior to the initiation of the procedure. Initial ultrasound scanning  demonstrates a large amount of ascites within the LEFT lower abdominal quadrant. The right lower abdomen was prepped and draped in the usual sterile fashion. 1% lidocaine was used for local anesthesia. Following this, a 5 [place] Yueh catheter was introduced. An ultrasound image was saved for documentation purposes. The paracentesis was performed. The catheter was removed and a dressing was applied. The patient tolerated the procedure well without immediate post procedural complication. FINDINGS: A total of approximately 4.2 mL of yellow ascitic fluid was removed. Samples were sent to the laboratory as requested by the clinical team. IMPRESSION: Successful ultrasound-guided paracentesis yielding 4.2 L liters of peritoneal fluid. Electronically Signed   By: Lavonia Dana M.D.   On: 10/24/2019 15:13   DG Chest Portable 1 View  Result Date: 10/23/2019 CLINICAL DATA:  Evaluate for pulmonary edema EXAM: PORTABLE CHEST 1 VIEW COMPARISON:  10/14/2019 FINDINGS: Cardiomegaly. Prior median sternotomy. Moderate right pleural effusion with right lower lobe atelectasis. Areas of scarring in the lungs bilaterally. No overt edema. No acute bony abnormality. IMPRESSION: Stable moderate right effusion with right base atelectasis. Cardiomegaly. Chronic changes.  No overt edema. Electronically Signed   By: Rolm Baptise M.D.   On: 10/23/2019 11:00   ECHOCARDIOGRAM COMPLETE  Result Date: 10/23/2019    ECHOCARDIOGRAM REPORT   Patient Name:   DELENN AHN Date of Exam: 10/23/2019 Medical Rec #:  [id number]   Height:       66.0 in Accession #:    [id number]  Weight:       160.2 lb Date of Birth:  [birth year]   BSA:          1.820 m Patient Age:    81 years    BP:           119/65 mmHg Patient Gender: F           HR:           71 bpm. Exam Location:  [facility] Procedure: 2D Echo Indications:    Dyspnea 786.09 / R06.00  History:        Patient has prior history of Echocardiogram examinations, most                 recent 04/24/2019. Stroke,  Arrythmias:Atrial Fibrillation; Risk                 Factors:Non-Smoker, Dyslipidemia, Diabetes and Hypertension.                 CKD.  Sonographer:    Leavy Cella RDCS (AE) Referring Phys: [id number] [place]  1. Left ventricular ejection fraction, by estimation, is 55 to 60%. The left ventricle has  normal function. The left ventricle has no regional wall motion abnormalities. Left ventricular diastolic parameters are indeterminate.  2. Right ventricular systolic function is severely reduced. The right ventricular size is severely enlarged. There is moderately elevated pulmonary artery systolic pressure.  3. Left atrial size was moderately dilated.  4. Right atrial size was severely dilated.  5. The mitral valve is normal in structure. Mild mitral valve regurgitation. No evidence of mitral stenosis.  6. The tricuspid valve is abnormal. Tricuspid valve regurgitation is severe.  7. There is a mechanical valve in the AV position. From medical record St Jude valve, unknown type and size. . The aortic valve has been repaired/replaced. Aortic valve regurgitation is not visualized. No aortic stenosis is present.  8. Severe pulmonary HTN, PASP is 65 mmHg.  9. The inferior vena cava is dilated in size with <50% respiratory variability, suggesting right atrial pressure of 15 mmHg. FINDINGS  Left Ventricle: Left ventricular ejection fraction, by estimation, is 55 to 60%. The left ventricle has normal function. The left ventricle has no regional wall motion abnormalities. The left ventricular internal cavity size was small. There is no left ventricular hypertrophy. Left ventricular diastolic parameters are indeterminate. Right Ventricle: The right ventricular size is severely enlarged. No increase in right ventricular wall thickness. Right ventricular systolic function is severely reduced. There is moderately elevated pulmonary artery systolic pressure. The tricuspid regurgitant velocity is 3.46 m/s, and  with an assumed right atrial pressure of 10 mmHg, the estimated right ventricular systolic pressure is 32.3 mmHg. Left Atrium: Left atrial size was moderately dilated. Right Atrium: Right atrial size was severely dilated. Pericardium: There is no evidence of pericardial effusion. Mitral Valve: The mitral valve is normal in structure. There is mild thickening of the mitral valve leaflet(s). There is mild calcification of the mitral valve leaflet(s). Mild mitral annular calcification. Mild mitral valve regurgitation. No evidence of  mitral valve stenosis. Tricuspid Valve: The tricuspid valve is abnormal. Tricuspid valve regurgitation is severe. No evidence of tricuspid stenosis. Aortic Valve: There is a mechanical valve in the AV position. From medical record St Jude valve, unknown type and size. The aortic valve has been repaired/replaced. Aortic valve regurgitation is not visualized. No aortic stenosis is present. Aortic valve  mean gradient measures 3.8 mmHg. Aortic valve peak gradient measures 6.4 mmHg. Pulmonic Valve: The pulmonic valve was not well visualized. Pulmonic valve regurgitation is mild to moderate. No evidence of pulmonic stenosis. Aorta: The aortic root is normal in size and structure. Pulmonary Artery: Severe pulmonary HTN, PASP is 65 mmHg. Venous: The inferior vena cava is dilated in size with less than 50% respiratory variability, suggesting right atrial pressure of 15 mmHg. IAS/Shunts: The interatrial septum was not well visualized.  LEFT VENTRICLE PLAX 2D LVIDd:         4.01 cm Diastology LVIDs:         3.50 cm LV e' lateral:   7.97 cm/s LV PW:         1.13 cm LV E/e' lateral: 14.1 LV IVS:        0.70 cm LV e' medial:    7.31 cm/s                        LV E/e' medial:  15.3  RIGHT VENTRICLE RV S prime:     4.05 cm/s TAPSE (M-mode): 0.8 cm LEFT ATRIUM             Index  RIGHT ATRIUM           Index LA diam:        3.60 cm 1.98 cm/m  RA Area:     18.80 cm LA Vol (A2C):   69.0 ml 37.92  ml/m RA Volume:   54.60 ml  30.00 ml/m LA Vol (A4C):   59.8 ml 32.86 ml/m LA Biplane Vol: 64.9 ml 35.66 ml/m  AORTIC VALVE AV Vmax:           126.67 cm/s AV Vmean:          93.430 cm/s AV VTI:            0.268 m AV Peak Grad:      6.4 mmHg AV Mean Grad:      3.8 mmHg LVOT Vmax:         64.97 cm/s LVOT Vmean:        44.995 cm/s LVOT VTI:          0.146 m LVOT/AV VTI ratio: 0.55  AORTA Ao Root diam: 2.30 cm MITRAL VALVE                TRICUSPID VALVE MV Area (PHT): 3.33 cm     TR Peak grad:   47.9 mmHg MV Decel Time: 228 msec     TR Vmax:        346.00 cm/s MV E velocity: 112.00 cm/s                             SHUNTS                             Systemic VTI: 0.15 m Carlyle Dolly MD Electronically signed by Carlyle Dolly MD Signature Date/Time: 10/23/2019/3:56:43 PM    Final      CBC Recent Labs  Lab 10/20/19 1043 10/23/19 1103 10/24/19 0748  WBC 4.2 4.6 4.4  HGB 9.4* 9.5* 9.1*  HCT 29.9* 30.8* 28.7*  PLT 143* 145* 137*  MCV 113.7* 114.1* 112.5*  MCH 35.7* 35.2* 35.7*  MCHC 31.4 30.8 31.7  RDW 16.5* 16.4* 16.5*  LYMPHSABS 0.6*  --   --   MONOABS 0.5  --   --   EOSABS 0.4  --   --   BASOSABS 0.0  --   --     Chemistries  Recent Labs  Lab 10/20/19 1043 10/23/19 1103 10/24/19 0748  NA 135 136 136  K 3.2* 3.4* 3.5  CL 92* 92* 93*  CO2 31 30 30   GLUCOSE 145* 112* 88  BUN 60* 61* 61*  CREATININE 2.37* 2.20* 2.18*  CALCIUM 8.6* 8.7* 8.6*  MG  --  2.5*  --   AST 24  --   --   ALT 9  --   --   ALKPHOS 67  --   --   BILITOT 1.5*  --   --    ------------------------------------------------------------------------------------------------------------------ No results for input(s): CHOL, HDL, LDLCALC, TRIG, CHOLHDL, LDLDIRECT in the last 72 hours.  Lab Results  Component Value Date   HGBA1C 5.1 10/23/2019   ------------------------------------------------------------------------------------------------------------------ No results for input(s): TSH, T4TOTAL, T3FREE,  THYROIDAB in the last 72 hours.  Invalid input(s): FREET3 ------------------------------------------------------------------------------------------------------------------ No results for input(s): VITAMINB12, FOLATE, FERRITIN, TIBC, IRON, RETICCTPCT in the last 72 hours.  Coagulation profile Recent Labs  Lab 10/23/19 1103 10/24/19 0748  INR 3.2* 3.1*    No results for  input(s): DDIMER in the last 72 hours.  Cardiac Enzymes No results for input(s): CKMB, TROPONINI, MYOGLOBIN in the last 168 hours.  Invalid input(s): CK ------------------------------------------------------------------------------------------------------------------    Component Value Date/Time   BNP 565.0 (H) 10/23/2019 1103     Roxan Hockey M.D on 10/24/2019 at 7:28 PM  Go to [email or web address] - for contact info  [facility] - Office  [phone]

## 2019-10-24 NOTE — Procedures (Signed)
PreOperative Dx: Cirrhosis, ascites Postoperative Dx: Cirrhosis, ascites Procedure:   US guided paracentesis Radiologist:  Afrika Brick Anesthesia:  10 ml of1% lidocaine Specimen:  4.2 L of yellow ascitic fluid EBL:   < 1 ml Complications: None  

## 2019-10-25 LAB — LIPID PANEL
Cholesterol: 67 mg/dL (ref 0–200)
HDL: 28 mg/dL — ABNORMAL LOW (ref >40)
LDL Cholesterol: 27 mg/dL (ref 0–99)
Total CHOL/HDL Ratio: 2.4 RATIO
Triglycerides: 61 mg/dL (ref <150)
VLDL: 12 mg/dL (ref 0–40)

## 2019-10-25 LAB — GLUCOSE, CAPILLARY
Glucose-Capillary: 82 mg/dL (ref 70–99)
Glucose-Capillary: 165 mg/dL — ABNORMAL HIGH (ref 70–99)
Glucose-Capillary: 65 mg/dL — ABNORMAL LOW (ref 70–99)
Glucose-Capillary: 140 mg/dL — ABNORMAL HIGH (ref 70–99)
Glucose-Capillary: 113 mg/dL — ABNORMAL HIGH (ref 70–99)

## 2019-10-25 LAB — CBC
HCT: 29.6 % — ABNORMAL LOW (ref 36.0–46.0)
Hemoglobin: 9.2 g/dL — ABNORMAL LOW (ref 12.0–15.0)
MCH: 35.4 pg — ABNORMAL HIGH (ref 26.0–34.0)
MCHC: 31.1 g/dL (ref 30.0–36.0)
MCV: 113.8 fL — ABNORMAL HIGH (ref 80.0–100.0)
Platelets: 133 10*3/uL — ABNORMAL LOW (ref 150–400)
RBC: 2.6 MIL/uL — ABNORMAL LOW (ref 3.87–5.11)
RDW: 16.7 % — ABNORMAL HIGH (ref 11.5–15.5)
WBC: 4 10*3/uL (ref 4.0–10.5)
nRBC: 0 % (ref 0.0–0.2)

## 2019-10-25 LAB — BASIC METABOLIC PANEL
Anion gap: 11 (ref 5–15)
BUN: 56 mg/dL — ABNORMAL HIGH (ref 8–23)
CO2: 32 mmol/L (ref 22–32)
Calcium: 8.8 mg/dL — ABNORMAL LOW (ref 8.9–10.3)
Chloride: 93 mmol/L — ABNORMAL LOW (ref 98–111)
Creatinine, Ser: 2.18 mg/dL — ABNORMAL HIGH (ref 0.44–1.00)
GFR calc Af Amer: 24 mL/min — ABNORMAL LOW (ref >60)
GFR calc non Af Amer: 21 mL/min — ABNORMAL LOW (ref >60)
Glucose, Bld: 108 mg/dL — ABNORMAL HIGH (ref 70–99)
Potassium: 3.6 mmol/L (ref 3.5–5.1)
Sodium: 136 mmol/L (ref 135–145)

## 2019-10-25 LAB — HEPATITIS PANEL, ACUTE
HCV Ab: NONREACTIVE
Hep A IgM: NONREACTIVE
Hep B C IgM: NONREACTIVE
Hepatitis B Surface Ag: NONREACTIVE

## 2019-10-25 LAB — PROTIME-INR
INR: 3.3 — ABNORMAL HIGH (ref 0.8–1.2)
Prothrombin Time: 32.2 seconds — ABNORMAL HIGH (ref 11.4–15.2)

## 2019-10-25 LAB — TSH: TSH: 2.951 u[IU]/mL (ref 0.350–4.500)

## 2019-10-25 NOTE — Progress Notes (Signed)
Patient Demographics:    Mary [name], is a 79 y.o. female, DOB - Jul 22, 1940, UEK:[id number]  Admit date - 10/23/2019   Admitting Physician Courage Denton Brick, MD  Outpatient Primary MD for the patient is [name], MD  LOS - 2   Chief Complaint  Patient presents with  . Shortness of Breath        Subjective:    [name] overall feels that shortness of breath is improving, no chest pain, feels lower extremity edema is improving  Assessment  & Plan :    Principal Problem:   Acute exacerbation of dCHF (congestive heart failure) (HCC) Active Problems:   Essential hypertension   CVA (cerebral vascular accident) ([place])   S/P aortic valve replacement   Diabetes (HCC)   OSA (obstructive sleep apnea)   CKD (chronic kidney disease), stage III  Brief Summary:- 79 y.o. female with past medical history relevant for diastolic dysfunction CHF, HTN, status post aortic valve replacement/mechanical [employer] Jude valve, 1995/1996, paroxysmal atrial fibrillation, history of prior stroke, history of DM, CKD 3b, OSA admitted on 10/23/2019 with acute on chronic diastolic dysfunction CHF exacerbation and ascites secondary to liver cirrhosis as well as AKI having failed outpatient attempt at oral diuresis   A/p 1)HFpEF/Acute on Chronic Diastolic Dysfunction CHF Exac--- in the Setting of pulmonary HTN and RV dysfunction - 04/2019 echo LVEF 60-65%, elevated LA pressure, moderate RV dysfunction, severe RVE, severe biatrial enlargement, mod MR, severe TR, severe pulm HTN -Repeat echo on 10/23/2019 with EF down to 55 to 60%, with severe pulmonary hypertension PASP of 65 -BNP 565 -PTA Patient continued to have with overload issues despite taking torsemide 80mg  bid, metolazone 10mg  as needed--- her creatinine was trending up with attempt at diuresis as outpatient, she also developed ascites-- -continue Lasix IV 80 mg twice a day,  daily weights and fluid input and output monitoring -Creatinine mildly improving with diuresis ---May benefit from transfer to [facility] for RHC once volume status improves  2) status post aortic valve replacement with [facility] Jude AVR in 1995/1996--- continue Coumadin therapy for mechanical valve--echo without significant valvular problems at this time -Pharmacy to help manage Coumadin therapy  3)OSA--- history of noncompliance with CPAP  4) history of paroxysmal A. fib and prior CVA--- on Coumadin as above #1, continue metoprolol for rate control, heart rate stable  5) history of cirrhosis on prior imaging studies--- does not appear to have had work-up for cirrhosis, abdominal ultrasound consistent with liver cirrhosis with ascites  =-Ammonia is 53 , no significant encephalopathy--- lactulose prophylactically --Status post paracentesis on 10/24/2018 for removal of 4.2 L--studies pending -Viral hepatitis profile negative, TSH normal, LDL 27 -Consider adding Aldactone to Lasix if renal function improves  6)AKI----acute kidney injury on CKD stage - IIIb    creatinine on admission=2.20  , baseline creatinine =1.4     -Repeat creatinine today 2.18 ---renally adjust medications, avoid nephrotoxic agents / dehydration  / hypotension -Monitor renal function closely with aggressive diuresis  7) chronic anemia----Hgb currently stable >> 9 which is patient's baseline  8)DM2- Use Novolog/Humalog Sliding scale insulin with Accu-Cheks/Fingersticks as ordered    Disposition/Need for in-Hospital Stay- patient unable to be discharged at this time due to --CHF exacerbation and worsening renal function  requiring IV diuresis and close monitoring of renal function while diuresing patient, failed outpatient oral diuretics  Status is: Inpatient  Remains inpatient appropriate because:HF exacerbation and worsening renal function requiring IV diuresis and close monitoring of renal function while  diuresing patient, failed outpatient oral diuretics   Dispo: The patient is from: Home  Anticipated d/c is to: Home  Anticipated d/c date is: 2 days  Patient currently is not medically stable to d/c. Barriers: Not Clinically Stable- HF exacerbation and worsening renal function requiring IV diuresis and close monitoring of renal function while diuresing patient, failed outpatient oral diuretics  Code Status : Full  Family Communication:   (patient is alert, awake and coherent)  =-Discussed with husband at bedside  Consults  :  cardiology  Procedures:- Status post paracentesis on 10/24/2018 for removal of 4.2 L--  DVT Prophylaxis  : Coumadin- SCDs   Lab Results  Component Value Date   PLT 133 (L) 10/25/2019    Inpatient Medications  Scheduled Meds: . fluticasone  1 spray Each Nare QHS  . furosemide  80 mg Intravenous Q12H  . HYDROcodone-acetaminophen  1 tablet Oral BID  . insulin aspart  0-5 Units Subcutaneous QHS  . insulin aspart  0-6 Units Subcutaneous TID WC  . lactulose  20 g Oral Daily  . loratadine  10 mg Oral Daily  . metoprolol succinate  100 mg Oral q morning - 10a  . pantoprazole  40 mg Oral QAC breakfast  . potassium chloride  10 mEq Oral q morning - 10a  . simvastatin  40 mg Oral Q2000  . sodium chloride flush  3 mL Intravenous Q12H  . vitamin B-12  500 mcg Oral Daily  . [START ON 10/26/2019] Vitamin D (Ergocalciferol)  50,000 Units Oral Q Sun  . warfarin  2 mg Oral q1600  . Warfarin - Pharmacist Dosing Inpatient   Does not apply q1600   Continuous Infusions: . sodium chloride     PRN Meds:.sodium chloride, acetaminophen, albuterol, ALPRAZolam, bisacodyl, ondansetron **OR** ondansetron (ZOFRAN) IV, sodium chloride flush, traZODone    Anti-infectives (From admission, onward)   None        Objective:   Vitals:   10/24/19 2115 10/25/19 0557 10/25/19 1002 10/25/19 1500  BP: (!) 114/59 (!) 109/53 120/67 (!) 101/52   Pulse: 78 69  (!) 58  Resp: 16 16  18   Temp: 98.4 F (36.9 C) 98.1 F (36.7 C)  98.2 F (36.8 C)  TempSrc:    Oral  SpO2: 98% 97%  98%  Weight:  69.4 kg    Height:        Wt Readings from Last 3 Encounters:  10/25/19 69.4 kg  10/23/19 73.5 kg  10/14/19 72 kg     Intake/Output Summary (Last 24 hours) at 10/25/2019 2015 Last data filed at 10/25/2019 1900 Gross per 24 hour  Intake 800 ml  Output 1500 ml  Net -700 ml     Physical Exam  General exam: Alert, awake, oriented x 3 Respiratory system: Clear to auscultation. Respiratory effort normal. Cardiovascular system:RRR. No murmurs, rubs, gallops. Gastrointestinal system: Abdomen is nondistended, soft and nontender. No organomegaly or masses felt. Normal bowel sounds heard. Central nervous system: Alert and oriented. No focal neurological deficits. Extremities: No C/C/E, +pedal pulses Skin: No rashes, lesions or ulcers Psychiatry: Judgement and insight appear normal. Mood & affect appropriate.     Data Review:   Micro Results Recent Results (from the past 240 hour(s))  SARS Coronavirus 2 by  RT PCR (hospital order, performed in [facility] hospital lab) Nasopharyngeal Nasopharyngeal Swab     Status: None   Collection Time: 10/23/19 11:52 AM   Specimen: Nasopharyngeal Swab  Result Value Ref Range Status   SARS Coronavirus 2 NEGATIVE NEGATIVE Final    Comment: (NOTE) SARS-CoV-2 target nucleic acids are NOT DETECTED.  The SARS-CoV-2 RNA is generally detectable in upper and lower respiratory specimens during the acute phase of infection. The lowest concentration of SARS-CoV-2 viral copies this assay can detect is 250 copies / mL. A negative result does not preclude SARS-CoV-2 infection and should not be used as the sole basis for treatment or other patient management decisions.  A negative result may occur with improper specimen collection / handling, submission of specimen other than nasopharyngeal swab, presence of  viral mutation(s) within the areas targeted by this assay, and inadequate number of viral copies (<250 copies / mL). A negative result must be combined with clinical observations, patient history, and epidemiological information.  Fact Sheet for Patients:   StrictlyIdeas.no  Fact Sheet for Healthcare Providers: BankingDealers.co.za  This test is not yet approved or  cleared by the [place] FDA and has been authorized for detection and/or diagnosis of SARS-CoV-2 by FDA under an Emergency Use Authorization (EUA).  This EUA will remain in effect (meaning this test can be used) for the duration of the COVID-19 declaration under Section 564(b)(1) of the Act, 21 U.S.C. section 360bbb-3(b)(1), unless the authorization is terminated or revoked sooner.  Performed at [facility], [address], [place], [place] [address]   Gram stain     Status: None   Collection Time: 10/24/19  2:22 PM   Specimen: Ascitic; Body Fluid  Result Value Ref Range Status   Specimen Description ASCITIC  Final   Special Requests NONE  Final   Gram Stain   Final    CYTOSPIN SMEAR NO ORGANISMS SEEN WBC PRESENT, PREDOMINANTLY MONONUCLEAR Performed at [facility], [address] [place], [place] [address]    Report Status 10/24/2019 FINAL  Final  Culture, body fluid-bottle     Status: None (Preliminary result)   Collection Time: 10/24/19  2:25 PM   Specimen: Ascitic  Result Value Ref Range Status   Specimen Description ASCITIC  Final   Special Requests 10CC  Final   Culture   Final    NO GROWTH < 24 HOURS Performed at [facility], [address] [place], [place] [address]    Report Status PENDING  Incomplete    Radiology Reports DG Chest 2 View  Result Date: 10/14/2019 CLINICAL DATA:  79 year old female with shortness of breath. EXAM: CHEST - 2 VIEW COMPARISON:  Chest radiograph dated 04/23/2019. FINDINGS: Small right pleural effusion with right lung  base atelectasis or infiltrate similar to prior radiograph. There is background of emphysema. No pneumothorax. There is mild cardiomegaly with probable mild vascular congestion. Median sternotomy wires. Atherosclerotic calcification of the aorta. No acute osseous pathology. IMPRESSION: 1. Stable right pleural effusion with right lung base atelectasis or infiltrate. 2. Cardiomegaly with probable mild vascular congestion. Electronically Signed   By: Anner Crete M.D.   On: 10/14/2019 18:10   US Abdomen Complete  Result Date: 10/24/2019 CLINICAL DATA:  Abdominal distension EXAM: ABDOMEN ULTRASOUND COMPLETE COMPARISON:  03/27/2016 FINDINGS: Gallbladder: Gallbladder is well distended without gallbladder wall thickening. Cholelithiasis and gallbladder sludge is seen. Common bile duct: Diameter: 3.1 mm. Liver: Liver is shrunken with a nodular appearance. No focal mass lesion is noted. Stable cyst  is noted in the left lobe of the liver measuring 1.8 cm in greatest dimension. Portal vein is patent on color Doppler imaging with normal direction of blood flow towards the liver. IVC: No abnormality visualized. Pancreas: Visualized portion unremarkable. Spleen: Size and appearance within normal limits. Right Kidney: Length: 7.9 cm. Slight increased echogenicity is noted. Scarring is also noted in the upper pole of the right kidney. No obstructive changes are seen. Left Kidney: Length: 9.4 cm. Echogenicity within normal limits. No mass or hydronephrosis visualized. Abdominal aorta: No aneurysm visualized. Other findings: Ascites is noted. Additionally a right-sided pleural effusion is noted. IMPRESSION: Changes consistent with cirrhosis of the liver with associated ascites. Right-sided pleural effusion. Left hepatic cyst stable from the prior study. Cholelithiasis and gallbladder sludge without complicating factors. Electronically Signed   By: Inez Catalina M.D.   On: 10/24/2019 09:02   US Paracentesis  Result Date:  10/24/2019 INDICATION: Cirrhosis, ascites EXAM: ULTRASOUND GUIDED DIAGNOSTIC AND THERAPEUTIC PARACENTESIS MEDICATIONS: None COMPLICATIONS: None immediate PROCEDURE: Informed written consent was obtained from the patient after a discussion of the risks, benefits and alternatives to treatment. A timeout was performed prior to the initiation of the procedure. Initial ultrasound scanning demonstrates a large amount of ascites within the LEFT lower abdominal quadrant. The right lower abdomen was prepped and draped in the usual sterile fashion. 1% lidocaine was used for local anesthesia. Following this, a 5 [place] Yueh catheter was introduced. An ultrasound image was saved for documentation purposes. The paracentesis was performed. The catheter was removed and a dressing was applied. The patient tolerated the procedure well without immediate post procedural complication. FINDINGS: A total of approximately 4.2 mL of yellow ascitic fluid was removed. Samples were sent to the laboratory as requested by the clinical team. IMPRESSION: Successful ultrasound-guided paracentesis yielding 4.2 L liters of peritoneal fluid. Electronically Signed   By: Lavonia Dana M.D.   On: 10/24/2019 15:13   DG Chest Portable 1 View  Result Date: 10/23/2019 CLINICAL DATA:  Evaluate for pulmonary edema EXAM: PORTABLE CHEST 1 VIEW COMPARISON:  10/14/2019 FINDINGS: Cardiomegaly. Prior median sternotomy. Moderate right pleural effusion with right lower lobe atelectasis. Areas of scarring in the lungs bilaterally. No overt edema. No acute bony abnormality. IMPRESSION: Stable moderate right effusion with right base atelectasis. Cardiomegaly. Chronic changes.  No overt edema. Electronically Signed   By: Rolm Baptise M.D.   On: 10/23/2019 11:00   ECHOCARDIOGRAM COMPLETE  Result Date: 10/23/2019    ECHOCARDIOGRAM REPORT   Patient Name:   SHILEE BIGGS Date of Exam: 10/23/2019 Medical Rec #:  [id number]   Height:       66.0 in Accession #:    [id number]   Weight:       160.2 lb Date of Birth:  1940/12/02   BSA:          1.820 m Patient Age:    2 years    BP:           119/65 mmHg Patient Gender: F           HR:           71 bpm. Exam Location:  [facility] Procedure: 2D Echo Indications:    Dyspnea 786.09 / R06.00  History:        Patient has prior history of Echocardiogram examinations, most                 recent 04/24/2019. Stroke, Arrythmias:Atrial Fibrillation; Risk  Factors:Non-Smoker, Dyslipidemia, Diabetes and Hypertension.                 CKD.  Sonographer:    Leavy Cella RDCS (AE) Referring Phys: [id number] [place]  1. Left ventricular ejection fraction, by estimation, is 55 to 60%. The left ventricle has normal function. The left ventricle has no regional wall motion abnormalities. Left ventricular diastolic parameters are indeterminate.  2. Right ventricular systolic function is severely reduced. The right ventricular size is severely enlarged. There is moderately elevated pulmonary artery systolic pressure.  3. Left atrial size was moderately dilated.  4. Right atrial size was severely dilated.  5. The mitral valve is normal in structure. Mild mitral valve regurgitation. No evidence of mitral stenosis.  6. The tricuspid valve is abnormal. Tricuspid valve regurgitation is severe.  7. There is a mechanical valve in the AV position. From medical record St Jude valve, unknown type and size. . The aortic valve has been repaired/replaced. Aortic valve regurgitation is not visualized. No aortic stenosis is present.  8. Severe pulmonary HTN, PASP is 65 mmHg.  9. The inferior vena cava is dilated in size with <50% respiratory variability, suggesting right atrial pressure of 15 mmHg. FINDINGS  Left Ventricle: Left ventricular ejection fraction, by estimation, is 55 to 60%. The left ventricle has normal function. The left ventricle has no regional wall motion abnormalities. The left ventricular internal cavity size was small.  There is no left ventricular hypertrophy. Left ventricular diastolic parameters are indeterminate. Right Ventricle: The right ventricular size is severely enlarged. No increase in right ventricular wall thickness. Right ventricular systolic function is severely reduced. There is moderately elevated pulmonary artery systolic pressure. The tricuspid regurgitant velocity is 3.46 m/s, and with an assumed right atrial pressure of 10 mmHg, the estimated right ventricular systolic pressure is 50.2 mmHg. Left Atrium: Left atrial size was moderately dilated. Right Atrium: Right atrial size was severely dilated. Pericardium: There is no evidence of pericardial effusion. Mitral Valve: The mitral valve is normal in structure. There is mild thickening of the mitral valve leaflet(s). There is mild calcification of the mitral valve leaflet(s). Mild mitral annular calcification. Mild mitral valve regurgitation. No evidence of  mitral valve stenosis. Tricuspid Valve: The tricuspid valve is abnormal. Tricuspid valve regurgitation is severe. No evidence of tricuspid stenosis. Aortic Valve: There is a mechanical valve in the AV position. From medical record St Jude valve, unknown type and size. The aortic valve has been repaired/replaced. Aortic valve regurgitation is not visualized. No aortic stenosis is present. Aortic valve  mean gradient measures 3.8 mmHg. Aortic valve peak gradient measures 6.4 mmHg. Pulmonic Valve: The pulmonic valve was not well visualized. Pulmonic valve regurgitation is mild to moderate. No evidence of pulmonic stenosis. Aorta: The aortic root is normal in size and structure. Pulmonary Artery: Severe pulmonary HTN, PASP is 65 mmHg. Venous: The inferior vena cava is dilated in size with less than 50% respiratory variability, suggesting right atrial pressure of 15 mmHg. IAS/Shunts: The interatrial septum was not well visualized.  LEFT VENTRICLE PLAX 2D LVIDd:         4.01 cm Diastology LVIDs:         3.50 cm LV  e' lateral:   7.97 cm/s LV PW:         1.13 cm LV E/e' lateral: 14.1 LV IVS:        0.70 cm LV e' medial:    7.31 cm/s  LV E/e' medial:  15.3  RIGHT VENTRICLE RV S prime:     4.05 cm/s TAPSE (M-mode): 0.8 cm LEFT ATRIUM             Index       RIGHT ATRIUM           Index LA diam:        3.60 cm 1.98 cm/m  RA Area:     18.80 cm LA Vol (A2C):   69.0 ml 37.92 ml/m RA Volume:   54.60 ml  30.00 ml/m LA Vol (A4C):   59.8 ml 32.86 ml/m LA Biplane Vol: 64.9 ml 35.66 ml/m  AORTIC VALVE AV Vmax:           126.67 cm/s AV Vmean:          93.430 cm/s AV VTI:            0.268 m AV Peak Grad:      6.4 mmHg AV Mean Grad:      3.8 mmHg LVOT Vmax:         64.97 cm/s LVOT Vmean:        44.995 cm/s LVOT VTI:          0.146 m LVOT/AV VTI ratio: 0.55  AORTA Ao Root diam: 2.30 cm MITRAL VALVE                TRICUSPID VALVE MV Area (PHT): 3.33 cm     TR Peak grad:   47.9 mmHg MV Decel Time: 228 msec     TR Vmax:        346.00 cm/s MV E velocity: 112.00 cm/s                             SHUNTS                             Systemic VTI: 0.15 m Carlyle Dolly MD Electronically signed by Carlyle Dolly MD Signature Date/Time: 10/23/2019/3:56:43 PM    Final      CBC Recent Labs  Lab 10/20/19 1043 10/23/19 1103 10/24/19 0748 10/25/19 0607  WBC 4.2 4.6 4.4 4.0  HGB 9.4* 9.5* 9.1* 9.2*  HCT 29.9* 30.8* 28.7* 29.6*  PLT 143* 145* 137* 133*  MCV 113.7* 114.1* 112.5* 113.8*  MCH 35.7* 35.2* 35.7* 35.4*  MCHC 31.4 30.8 31.7 31.1  RDW 16.5* 16.4* 16.5* 16.7*  LYMPHSABS 0.6*  --   --   --   MONOABS 0.5  --   --   --   EOSABS 0.4  --   --   --   BASOSABS 0.0  --   --   --     Chemistries  Recent Labs  Lab 10/20/19 1043 10/23/19 1103 10/24/19 0748 10/25/19 0607  NA 135 136 136 136  K 3.2* 3.4* 3.5 3.6  CL 92* 92* 93* 93*  CO2 31 30 30  32  GLUCOSE 145* 112* 88 108*  BUN 60* 61* 61* 56*  CREATININE 2.37* 2.20* 2.18* 2.18*  CALCIUM 8.6* 8.7* 8.6* 8.8*  MG  --  2.5*  --   --   AST 24  --   --    --   ALT 9  --   --   --   ALKPHOS 67  --   --   --   BILITOT 1.5*  --   --   --    ------------------------------------------------------------------------------------------------------------------  Recent Labs    10/25/19 0607  CHOL 67  HDL 28*  LDLCALC 27  TRIG 61  CHOLHDL 2.4    Lab Results  Component Value Date   HGBA1C 5.1 10/23/2019   ------------------------------------------------------------------------------------------------------------------ Recent Labs    10/25/19 0607  TSH 2.951   ------------------------------------------------------------------------------------------------------------------ No results for input(s): VITAMINB12, FOLATE, FERRITIN, TIBC, IRON, RETICCTPCT in the last 72 hours.  Coagulation profile Recent Labs  Lab 10/23/19 1103 10/24/19 0748 10/25/19 0607  INR 3.2* 3.1* 3.3*    No results for input(s): DDIMER in the last 72 hours.  Cardiac Enzymes No results for input(s): CKMB, TROPONINI, MYOGLOBIN in the last 168 hours.  Invalid input(s): CK ------------------------------------------------------------------------------------------------------------------    Component Value Date/Time   BNP 565.0 (H) 10/23/2019 1103     Kathie Dike M.D on 10/25/2019 at 8:15 PM  Go to [email or web address] - for contact info  [facility] - Office  [phone]

## 2019-10-25 NOTE — Progress Notes (Addendum)
ANTICOAGULATION CONSULT NOTE -  Pharmacy Consult for warfarin Indication: mechanical AVR and afib  Allergies  Allergen Reactions  . Tape Itching    Redness, Please use "paper" tape only    Patient Measurements: Height: 5\' 6"  (167.6 cm) Weight: 69.4 kg (153 lb) IBW/kg (Calculated) : 59.3  Vital Signs: Temp: 98.1 F (36.7 C) (08/07 0557) BP: 109/53 (08/07 0557) Pulse Rate: 69 (08/07 0557)  Labs: Recent Labs    10/23/19 1103 10/23/19 1103 10/24/19 0748 10/25/19 0607  HGB 9.5*   < > 9.1* 9.2*  HCT 30.8*  --  28.7* 29.6*  PLT 145*  --  137* 133*  LABPROT 31.7*  --  31.3* 32.2*  INR 3.2*  --  3.1* 3.3*  CREATININE 2.20*  --  2.18* 2.18*   < > = values in this interval not displayed.    Estimated Creatinine Clearance: 18.9 mL/min (A) (by C-G formula based on SCr of 2.18 mg/dL (H)).   Medical History: Past Medical History:  Diagnosis Date  . Anxiety   . Diabetes mellitus with neuropathy ([place])   . Diabetic neuropathy ([place])   . DJD (degenerative joint disease)   . H/O aortic valve replacement   . Hyperlipidemia   . Hypertension   . Neuromuscular disorder (HCC)    neuropathy in feet  . Obesity   . Sleep apnea   . Stroke [facility])    " light stroke"  . Wears glasses     Medications:  PTA medications include warfarin 2 mg po qday  Assessment: Pharmacy consulted to dose warfarin for this patient with a history of mechanical AVR and afib.  Last warfarin 2 mg dose was 10/23/19. INR 3.3  Goal of Therapy:  INR 2.5-3.5 Monitor platelets by anticoagulation protocol: Yes   Plan:  Continue warfarin 2 mg daily Monitor daily INR for warfarin dose adjustments, CBC, s/s bleeding   Thomasenia Sales, PharmD, MBA, BCGP Clinical Pharmacist  10/25/2019 8:32 AM

## 2019-10-26 LAB — GLUCOSE, CAPILLARY
Glucose-Capillary: 109 mg/dL — ABNORMAL HIGH (ref 70–99)
Glucose-Capillary: 128 mg/dL — ABNORMAL HIGH (ref 70–99)
Glucose-Capillary: 100 mg/dL — ABNORMAL HIGH (ref 70–99)
Glucose-Capillary: 133 mg/dL — ABNORMAL HIGH (ref 70–99)

## 2019-10-26 LAB — RENAL FUNCTION PANEL
Albumin: 2.4 g/dL — ABNORMAL LOW (ref 3.5–5.0)
Anion gap: 12 (ref 5–15)
BUN: 52 mg/dL — ABNORMAL HIGH (ref 8–23)
CO2: 31 mmol/L (ref 22–32)
Calcium: 8.7 mg/dL — ABNORMAL LOW (ref 8.9–10.3)
Chloride: 93 mmol/L — ABNORMAL LOW (ref 98–111)
Creatinine, Ser: 2.12 mg/dL — ABNORMAL HIGH (ref 0.44–1.00)
GFR calc Af Amer: 25 mL/min — ABNORMAL LOW (ref >60)
GFR calc non Af Amer: 21 mL/min — ABNORMAL LOW (ref >60)
Glucose, Bld: 86 mg/dL (ref 70–99)
Phosphorus: 3.8 mg/dL (ref 2.5–4.6)
Potassium: 3.2 mmol/L — ABNORMAL LOW (ref 3.5–5.1)
Sodium: 136 mmol/L (ref 135–145)

## 2019-10-26 LAB — PROTIME-INR
INR: 3 — ABNORMAL HIGH (ref 0.8–1.2)
Prothrombin Time: 30.2 seconds — ABNORMAL HIGH (ref 11.4–15.2)

## 2019-10-26 MED ORDER — POTASSIUM CHLORIDE CRYS ER 20 MEQ PO TBCR
40.0000 meq | EXTENDED_RELEASE_TABLET | Freq: Once | ORAL | Status: AC
  Administered 2019-10-26: 40 meq via ORAL
  Filled 2019-10-26: qty 2

## 2019-10-26 NOTE — Progress Notes (Signed)
Patient Demographics:    Mary [name], is a 79 y.o. female, DOB - 1940-06-08, AJO:[id number]  Admit date - 10/23/2019   Admitting Physician Courage Denton Brick, MD  Outpatient Primary MD for the patient is [name], MD  LOS - 3   Chief Complaint  Patient presents with  . Shortness of Breath        Subjective:    [name] still has shortness of breath on exertion and feels her breathing is not back to baseline yet.  Assessment  & Plan :    Principal Problem:   Acute exacerbation of dCHF (congestive heart failure) (HCC) Active Problems:   Essential hypertension   CVA (cerebral vascular accident) ([place])   S/P aortic valve replacement   Diabetes (HCC)   OSA (obstructive sleep apnea)   CKD (chronic kidney disease), stage III  Brief Summary:- 79 y.o. female with past medical history relevant for diastolic dysfunction CHF, HTN, status post aortic valve replacement/mechanical [employer] Jude valve, 1995/1996, paroxysmal atrial fibrillation, history of prior stroke, history of DM, CKD 3b, OSA admitted on 10/23/2019 with acute on chronic diastolic dysfunction CHF exacerbation and ascites secondary to liver cirrhosis as well as AKI having failed outpatient attempt at oral diuresis   A/p 1)HFpEF/Acute on Chronic Diastolic Dysfunction CHF Exac--- in the Setting of pulmonary HTN and RV dysfunction - 04/2019 echo LVEF 60-65%, elevated LA pressure, moderate RV dysfunction, severe RVE, severe biatrial enlargement, mod MR, severe TR, severe pulm HTN -Repeat echo on 10/23/2019 with EF down to 55 to 60%, with severe pulmonary hypertension PASP of 65 -BNP 565 -PTA Patient continued to have with overload issues despite taking torsemide 80mg  bid, metolazone 10mg  as needed--- her creatinine was trending up with attempt at diuresis as outpatient, she also developed ascites-- -continue Lasix IV 80 mg twice a day, daily weights and  fluid input and output monitoring -Creatinine mildly improving with diuresis ---May benefit from transfer to [facility] for RHC once volume status improves  2) status post aortic valve replacement with [facility] Jude AVR in 1995/1996--- continue Coumadin therapy for mechanical valve--echo without significant valvular problems at this time -Pharmacy to help manage Coumadin therapy  3)OSA--- history of noncompliance with CPAP  4) history of paroxysmal A. fib and prior CVA--- on Coumadin as above #1, continue metoprolol for rate control, heart rate stable  5) history of cirrhosis on prior imaging studies--- does not appear to have had work-up for cirrhosis, abdominal ultrasound consistent with liver cirrhosis with ascites  =-Ammonia is 53 , no significant encephalopathy--- lactulose prophylactically --Status post paracentesis on 10/24/2018 for removal of 4.2 L--studies pending -Viral hepatitis profile negative, TSH normal, LDL 27 -Consider adding Aldactone to Lasix if renal function improves  6)AKI----acute kidney injury on CKD stage - IIIb    creatinine on admission=2.20  , baseline creatinine =1.4     -Repeat creatinine today 2.12 ---renally adjust medications, avoid nephrotoxic agents / dehydration  / hypotension -Monitor renal function closely with aggressive diuresis  7) chronic anemia----Hgb currently stable >> 9 which is patient's baseline  8)DM2- Use Novolog/Humalog Sliding scale insulin with Accu-Cheks/Fingersticks as ordered    Disposition/Need for in-Hospital Stay- patient unable to be discharged at this time due to --CHF exacerbation and worsening renal function  requiring IV diuresis and close monitoring of renal function while diuresing patient, failed outpatient oral diuretics  Status is: Inpatient  Remains inpatient appropriate because:HF exacerbation and worsening renal function requiring IV diuresis and close monitoring of renal function while diuresing patient,  failed outpatient oral diuretics   Dispo: The patient is from: Home  Anticipated d/c is to: Home  Anticipated d/c date is: 2 days  Patient currently is not medically stable to d/c. Barriers: Not Clinically Stable- HF exacerbation and worsening renal function requiring IV diuresis and close monitoring of renal function while diuresing patient, failed outpatient oral diuretics  Code Status : Full  Family Communication:   (patient is alert, awake and coherent)  =-Discussed with husband at bedside  Consults  :  cardiology  Procedures:- Status post paracentesis on 10/24/2018 for removal of 4.2 L--  DVT Prophylaxis  : Coumadin- SCDs   Lab Results  Component Value Date   PLT 133 (L) 10/25/2019    Inpatient Medications  Scheduled Meds: . fluticasone  1 spray Each Nare QHS  . furosemide  80 mg Intravenous Q12H  . HYDROcodone-acetaminophen  1 tablet Oral BID  . insulin aspart  0-5 Units Subcutaneous QHS  . insulin aspart  0-6 Units Subcutaneous TID WC  . lactulose  20 g Oral Daily  . loratadine  10 mg Oral Daily  . metoprolol succinate  100 mg Oral q morning - 10a  . pantoprazole  40 mg Oral QAC breakfast  . potassium chloride  10 mEq Oral q morning - 10a  . simvastatin  40 mg Oral Q2000  . sodium chloride flush  3 mL Intravenous Q12H  . vitamin B-12  500 mcg Oral Daily  . Vitamin D (Ergocalciferol)  50,000 Units Oral Q Sun  . warfarin  2 mg Oral q1600  . Warfarin - Pharmacist Dosing Inpatient   Does not apply q1600   Continuous Infusions: . sodium chloride     PRN Meds:.sodium chloride, acetaminophen, albuterol, ALPRAZolam, bisacodyl, ondansetron **OR** ondansetron (ZOFRAN) IV, sodium chloride flush, traZODone    Anti-infectives (From admission, onward)   None        Objective:   Vitals:   10/25/19 1500 10/25/19 2255 10/26/19 0537 10/26/19 1509  BP: (!) 101/52 110/61 (!) 103/50 (!) 102/49  Pulse: (!) 58 72 69 66  Resp: 18 16  16 18   Temp: 98.2 F (36.8 C) 97.9 F (36.6 C) 97.8 F (36.6 C) 98.1 F (36.7 C)  TempSrc: Oral Oral Oral   SpO2: 98% 99% 95% 98%  Weight:   69.5 kg   Height:        Wt Readings from Last 3 Encounters:  10/26/19 69.5 kg  10/23/19 73.5 kg  10/14/19 72 kg     Intake/Output Summary (Last 24 hours) at 10/26/2019 1958 Last data filed at 10/26/2019 1700 Gross per 24 hour  Intake 800 ml  Output 950 ml  Net -150 ml     Physical Exam  General exam: Alert, awake, oriented x 3 Respiratory system: Clear to auscultation. Respiratory effort normal. Cardiovascular system:RRR. No murmurs, rubs, gallops. Gastrointestinal system: Abdomen is nondistended, soft and nontender. No organomegaly or masses felt. Normal bowel sounds heard. Central nervous system: Alert and oriented. No focal neurological deficits. Extremities: No C/C/E, +pedal pulses Skin: No rashes, lesions or ulcers Psychiatry: Judgement and insight appear normal. Mood & affect appropriate.      Data Review:   Micro Results Recent Results (from the past 240 hour(s))  SARS Coronavirus 2  by RT PCR (hospital order, performed in [facility] hospital lab) Nasopharyngeal Nasopharyngeal Swab     Status: None   Collection Time: 10/23/19 11:52 AM   Specimen: Nasopharyngeal Swab  Result Value Ref Range Status   SARS Coronavirus 2 NEGATIVE NEGATIVE Final    Comment: (NOTE) SARS-CoV-2 target nucleic acids are NOT DETECTED.  The SARS-CoV-2 RNA is generally detectable in upper and lower respiratory specimens during the acute phase of infection. The lowest concentration of SARS-CoV-2 viral copies this assay can detect is 250 copies / mL. A negative result does not preclude SARS-CoV-2 infection and should not be used as the sole basis for treatment or other patient management decisions.  A negative result may occur with improper specimen collection / handling, submission of specimen other than nasopharyngeal swab, presence of viral  mutation(s) within the areas targeted by this assay, and inadequate number of viral copies (<250 copies / mL). A negative result must be combined with clinical observations, patient history, and epidemiological information.  Fact Sheet for Patients:   StrictlyIdeas.no  Fact Sheet for Healthcare Providers: BankingDealers.co.za  This test is not yet approved or  cleared by the [place] FDA and has been authorized for detection and/or diagnosis of SARS-CoV-2 by FDA under an Emergency Use Authorization (EUA).  This EUA will remain in effect (meaning this test can be used) for the duration of the COVID-19 declaration under Section 564(b)(1) of the Act, 21 U.S.C. section 360bbb-3(b)(1), unless the authorization is terminated or revoked sooner.  Performed at [facility], [address], [place], [place] [address]   Gram stain     Status: None   Collection Time: 10/24/19  2:22 PM   Specimen: Ascitic; Body Fluid  Result Value Ref Range Status   Specimen Description ASCITIC  Final   Special Requests NONE  Final   Gram Stain   Final    CYTOSPIN SMEAR NO ORGANISMS SEEN WBC PRESENT, PREDOMINANTLY MONONUCLEAR Performed at [facility], [address] [place], [place] [address]    Report Status 10/24/2019 FINAL  Final  Culture, body fluid-bottle     Status: None (Preliminary result)   Collection Time: 10/24/19  2:25 PM   Specimen: Ascitic  Result Value Ref Range Status   Specimen Description ASCITIC  Final   Special Requests 10CC  Final   Culture   Final    NO GROWTH < 24 HOURS Performed at [facility], [address] [place], [place] [address]    Report Status PENDING  Incomplete    Radiology Reports DG Chest 2 View  Result Date: 10/14/2019 CLINICAL DATA:  79 year old female with shortness of breath. EXAM: CHEST - 2 VIEW COMPARISON:  Chest radiograph dated 04/23/2019. FINDINGS: Small right pleural effusion with right lung base  atelectasis or infiltrate similar to prior radiograph. There is background of emphysema. No pneumothorax. There is mild cardiomegaly with probable mild vascular congestion. Median sternotomy wires. Atherosclerotic calcification of the aorta. No acute osseous pathology. IMPRESSION: 1. Stable right pleural effusion with right lung base atelectasis or infiltrate. 2. Cardiomegaly with probable mild vascular congestion. Electronically Signed   By: Anner Crete M.D.   On: 10/14/2019 18:10   US Abdomen Complete  Result Date: 10/24/2019 CLINICAL DATA:  Abdominal distension EXAM: ABDOMEN ULTRASOUND COMPLETE COMPARISON:  03/27/2016 FINDINGS: Gallbladder: Gallbladder is well distended without gallbladder wall thickening. Cholelithiasis and gallbladder sludge is seen. Common bile duct: Diameter: 3.1 mm. Liver: Liver is shrunken with a nodular appearance. No focal mass lesion is noted. Stable  cyst is noted in the left lobe of the liver measuring 1.8 cm in greatest dimension. Portal vein is patent on color Doppler imaging with normal direction of blood flow towards the liver. IVC: No abnormality visualized. Pancreas: Visualized portion unremarkable. Spleen: Size and appearance within normal limits. Right Kidney: Length: 7.9 cm. Slight increased echogenicity is noted. Scarring is also noted in the upper pole of the right kidney. No obstructive changes are seen. Left Kidney: Length: 9.4 cm. Echogenicity within normal limits. No mass or hydronephrosis visualized. Abdominal aorta: No aneurysm visualized. Other findings: Ascites is noted. Additionally a right-sided pleural effusion is noted. IMPRESSION: Changes consistent with cirrhosis of the liver with associated ascites. Right-sided pleural effusion. Left hepatic cyst stable from the prior study. Cholelithiasis and gallbladder sludge without complicating factors. Electronically Signed   By: Inez Catalina M.D.   On: 10/24/2019 09:02   US Paracentesis  Result Date:  10/24/2019 INDICATION: Cirrhosis, ascites EXAM: ULTRASOUND GUIDED DIAGNOSTIC AND THERAPEUTIC PARACENTESIS MEDICATIONS: None COMPLICATIONS: None immediate PROCEDURE: Informed written consent was obtained from the patient after a discussion of the risks, benefits and alternatives to treatment. A timeout was performed prior to the initiation of the procedure. Initial ultrasound scanning demonstrates a large amount of ascites within the LEFT lower abdominal quadrant. The right lower abdomen was prepped and draped in the usual sterile fashion. 1% lidocaine was used for local anesthesia. Following this, a 5 [place] Yueh catheter was introduced. An ultrasound image was saved for documentation purposes. The paracentesis was performed. The catheter was removed and a dressing was applied. The patient tolerated the procedure well without immediate post procedural complication. FINDINGS: A total of approximately 4.2 mL of yellow ascitic fluid was removed. Samples were sent to the laboratory as requested by the clinical team. IMPRESSION: Successful ultrasound-guided paracentesis yielding 4.2 L liters of peritoneal fluid. Electronically Signed   By: Lavonia Dana M.D.   On: 10/24/2019 15:13   DG Chest Portable 1 View  Result Date: 10/23/2019 CLINICAL DATA:  Evaluate for pulmonary edema EXAM: PORTABLE CHEST 1 VIEW COMPARISON:  10/14/2019 FINDINGS: Cardiomegaly. Prior median sternotomy. Moderate right pleural effusion with right lower lobe atelectasis. Areas of scarring in the lungs bilaterally. No overt edema. No acute bony abnormality. IMPRESSION: Stable moderate right effusion with right base atelectasis. Cardiomegaly. Chronic changes.  No overt edema. Electronically Signed   By: Rolm Baptise M.D.   On: 10/23/2019 11:00   ECHOCARDIOGRAM COMPLETE  Result Date: 10/23/2019    ECHOCARDIOGRAM REPORT   Patient Name:   ELANORA QUIN Date of Exam: 10/23/2019 Medical Rec #:  [id number]   Height:       66.0 in Accession #:    [id number]   Weight:       160.2 lb Date of Birth:  02-05-1941   BSA:          1.820 m Patient Age:    49 years    BP:           119/65 mmHg Patient Gender: F           HR:           71 bpm. Exam Location:  [facility] Procedure: 2D Echo Indications:    Dyspnea 786.09 / R06.00  History:        Patient has prior history of Echocardiogram examinations, most                 recent 04/24/2019. Stroke, Arrythmias:Atrial Fibrillation; Risk  Factors:Non-Smoker, Dyslipidemia, Diabetes and Hypertension.                 CKD.  Sonographer:    Leavy Cella RDCS (AE) Referring Phys: [id number] [place]  1. Left ventricular ejection fraction, by estimation, is 55 to 60%. The left ventricle has normal function. The left ventricle has no regional wall motion abnormalities. Left ventricular diastolic parameters are indeterminate.  2. Right ventricular systolic function is severely reduced. The right ventricular size is severely enlarged. There is moderately elevated pulmonary artery systolic pressure.  3. Left atrial size was moderately dilated.  4. Right atrial size was severely dilated.  5. The mitral valve is normal in structure. Mild mitral valve regurgitation. No evidence of mitral stenosis.  6. The tricuspid valve is abnormal. Tricuspid valve regurgitation is severe.  7. There is a mechanical valve in the AV position. From medical record St Jude valve, unknown type and size. . The aortic valve has been repaired/replaced. Aortic valve regurgitation is not visualized. No aortic stenosis is present.  8. Severe pulmonary HTN, PASP is 65 mmHg.  9. The inferior vena cava is dilated in size with <50% respiratory variability, suggesting right atrial pressure of 15 mmHg. FINDINGS  Left Ventricle: Left ventricular ejection fraction, by estimation, is 55 to 60%. The left ventricle has normal function. The left ventricle has no regional wall motion abnormalities. The left ventricular internal cavity size was small.  There is no left ventricular hypertrophy. Left ventricular diastolic parameters are indeterminate. Right Ventricle: The right ventricular size is severely enlarged. No increase in right ventricular wall thickness. Right ventricular systolic function is severely reduced. There is moderately elevated pulmonary artery systolic pressure. The tricuspid regurgitant velocity is 3.46 m/s, and with an assumed right atrial pressure of 10 mmHg, the estimated right ventricular systolic pressure is 99.8 mmHg. Left Atrium: Left atrial size was moderately dilated. Right Atrium: Right atrial size was severely dilated. Pericardium: There is no evidence of pericardial effusion. Mitral Valve: The mitral valve is normal in structure. There is mild thickening of the mitral valve leaflet(s). There is mild calcification of the mitral valve leaflet(s). Mild mitral annular calcification. Mild mitral valve regurgitation. No evidence of  mitral valve stenosis. Tricuspid Valve: The tricuspid valve is abnormal. Tricuspid valve regurgitation is severe. No evidence of tricuspid stenosis. Aortic Valve: There is a mechanical valve in the AV position. From medical record St Jude valve, unknown type and size. The aortic valve has been repaired/replaced. Aortic valve regurgitation is not visualized. No aortic stenosis is present. Aortic valve  mean gradient measures 3.8 mmHg. Aortic valve peak gradient measures 6.4 mmHg. Pulmonic Valve: The pulmonic valve was not well visualized. Pulmonic valve regurgitation is mild to moderate. No evidence of pulmonic stenosis. Aorta: The aortic root is normal in size and structure. Pulmonary Artery: Severe pulmonary HTN, PASP is 65 mmHg. Venous: The inferior vena cava is dilated in size with less than 50% respiratory variability, suggesting right atrial pressure of 15 mmHg. IAS/Shunts: The interatrial septum was not well visualized.  LEFT VENTRICLE PLAX 2D LVIDd:         4.01 cm Diastology LVIDs:         3.50 cm LV  e' lateral:   7.97 cm/s LV PW:         1.13 cm LV E/e' lateral: 14.1 LV IVS:        0.70 cm LV e' medial:    7.31 cm/s  LV E/e' medial:  15.3  RIGHT VENTRICLE RV S prime:     4.05 cm/s TAPSE (M-mode): 0.8 cm LEFT ATRIUM             Index       RIGHT ATRIUM           Index LA diam:        3.60 cm 1.98 cm/m  RA Area:     18.80 cm LA Vol (A2C):   69.0 ml 37.92 ml/m RA Volume:   54.60 ml  30.00 ml/m LA Vol (A4C):   59.8 ml 32.86 ml/m LA Biplane Vol: 64.9 ml 35.66 ml/m  AORTIC VALVE AV Vmax:           126.67 cm/s AV Vmean:          93.430 cm/s AV VTI:            0.268 m AV Peak Grad:      6.4 mmHg AV Mean Grad:      3.8 mmHg LVOT Vmax:         64.97 cm/s LVOT Vmean:        44.995 cm/s LVOT VTI:          0.146 m LVOT/AV VTI ratio: 0.55  AORTA Ao Root diam: 2.30 cm MITRAL VALVE                TRICUSPID VALVE MV Area (PHT): 3.33 cm     TR Peak grad:   47.9 mmHg MV Decel Time: 228 msec     TR Vmax:        346.00 cm/s MV E velocity: 112.00 cm/s                             SHUNTS                             Systemic VTI: 0.15 m Carlyle Dolly MD Electronically signed by Carlyle Dolly MD Signature Date/Time: 10/23/2019/3:56:43 PM    Final      CBC Recent Labs  Lab 10/20/19 1043 10/23/19 1103 10/24/19 0748 10/25/19 0607  WBC 4.2 4.6 4.4 4.0  HGB 9.4* 9.5* 9.1* 9.2*  HCT 29.9* 30.8* 28.7* 29.6*  PLT 143* 145* 137* 133*  MCV 113.7* 114.1* 112.5* 113.8*  MCH 35.7* 35.2* 35.7* 35.4*  MCHC 31.4 30.8 31.7 31.1  RDW 16.5* 16.4* 16.5* 16.7*  LYMPHSABS 0.6*  --   --   --   MONOABS 0.5  --   --   --   EOSABS 0.4  --   --   --   BASOSABS 0.0  --   --   --     Chemistries  Recent Labs  Lab 10/20/19 1043 10/23/19 1103 10/24/19 0748 10/25/19 0607 10/26/19 0654  NA 135 136 136 136 136  K 3.2* 3.4* 3.5 3.6 3.2*  CL 92* 92* 93* 93* 93*  CO2 31 30 30  32 31  GLUCOSE 145* 112* 88 108* 86  BUN 60* 61* 61* 56* 52*  CREATININE 2.37* 2.20* 2.18* 2.18* 2.12*  CALCIUM 8.6* 8.7* 8.6*  8.8* 8.7*  MG  --  2.5*  --   --   --   AST 24  --   --   --   --   ALT 9  --   --   --   --   ALKPHOS 67  --   --   --   --  BILITOT 1.5*  --   --   --   --    ------------------------------------------------------------------------------------------------------------------ Recent Labs    10/25/19 0607  CHOL 67  HDL 28*  LDLCALC 27  TRIG 61  CHOLHDL 2.4    Lab Results  Component Value Date   HGBA1C 5.1 10/23/2019   ------------------------------------------------------------------------------------------------------------------ Recent Labs    10/25/19 0607  TSH 2.951   ------------------------------------------------------------------------------------------------------------------ No results for input(s): VITAMINB12, FOLATE, FERRITIN, TIBC, IRON, RETICCTPCT in the last 72 hours.  Coagulation profile Recent Labs  Lab 10/23/19 1103 10/24/19 0748 10/25/19 0607 10/26/19 0654  INR 3.2* 3.1* 3.3* 3.0*    No results for input(s): DDIMER in the last 72 hours.  Cardiac Enzymes No results for input(s): CKMB, TROPONINI, MYOGLOBIN in the last 168 hours.  Invalid input(s): CK ------------------------------------------------------------------------------------------------------------------    Component Value Date/Time   BNP 565.0 (H) 10/23/2019 1103     Kathie Dike M.D on 10/26/2019 at 7:58 PM  Go to [email or web address] - for contact info  [facility] - Office  [phone]

## 2019-10-26 NOTE — Progress Notes (Signed)
ANTICOAGULATION CONSULT NOTE -  Pharmacy Consult for warfarin Indication: mechanical AVR and afib  Allergies  Allergen Reactions  . Tape Itching    Redness, Please use "paper" tape only    Patient Measurements: Height: 5\' 6"  (167.6 cm) Weight: 69.5 kg (153 lb 3.5 oz) IBW/kg (Calculated) : 59.3  Vital Signs: Temp: 97.8 F (36.6 C) (08/08 0537) Temp Source: Oral (08/08 0537) BP: 103/50 (08/08 0537) Pulse Rate: 69 (08/08 0537)  Labs: Recent Labs    10/23/19 1103 10/23/19 1103 10/24/19 0748 10/25/19 0607 10/26/19 0654  HGB 9.5*   < > 9.1* 9.2*  --   HCT 30.8*  --  28.7* 29.6*  --   PLT 145*  --  137* 133*  --   LABPROT 31.7*   < > 31.3* 32.2* 30.2*  INR 3.2*   < > 3.1* 3.3* 3.0*  CREATININE 2.20*   < > 2.18* 2.18* 2.12*   < > = values in this interval not displayed.    Estimated Creatinine Clearance: 19.5 mL/min (A) (by C-G formula based on SCr of 2.12 mg/dL (H)).   Medical History: Past Medical History:  Diagnosis Date  . Anxiety   . Diabetes mellitus with neuropathy ([place])   . Diabetic neuropathy ([place])   . DJD (degenerative joint disease)   . H/O aortic valve replacement   . Hyperlipidemia   . Hypertension   . Neuromuscular disorder (HCC)    neuropathy in feet  . Obesity   . Sleep apnea   . Stroke [facility])    " light stroke"  . Wears glasses     Medications:  PTA medications include warfarin 2 mg po qday  Assessment: Pharmacy consulted to dose warfarin for this patient with a history of mechanical AVR and afib.  Last warfarin 2 mg dose was 10/23/19. INR 3.0  Goal of Therapy:  INR 2.5-3.5 Monitor platelets by anticoagulation protocol: Yes   Plan:  Continue warfarin 2 mg daily Monitor daily INR for warfarin dose adjustments, CBC, s/s bleeding   Thomasenia Sales, PharmD, MBA, BCGP Clinical Pharmacist  10/26/2019 9:46 AM

## 2019-10-27 DIAGNOSIS — I5041 Acute combined systolic (congestive) and diastolic (congestive) heart failure: Secondary | ICD-10-CM

## 2019-10-27 DIAGNOSIS — I5043 Acute on chronic combined systolic (congestive) and diastolic (congestive) heart failure: Secondary | ICD-10-CM | POA: Diagnosis present

## 2019-10-27 LAB — RENAL FUNCTION PANEL
Albumin: 2.5 g/dL — ABNORMAL LOW (ref 3.5–5.0)
Anion gap: 12 (ref 5–15)
BUN: 53 mg/dL — ABNORMAL HIGH (ref 8–23)
CO2: 32 mmol/L (ref 22–32)
Calcium: 8.8 mg/dL — ABNORMAL LOW (ref 8.9–10.3)
Chloride: 91 mmol/L — ABNORMAL LOW (ref 98–111)
Creatinine, Ser: 2.26 mg/dL — ABNORMAL HIGH (ref 0.44–1.00)
GFR calc Af Amer: 23 mL/min — ABNORMAL LOW (ref >60)
GFR calc non Af Amer: 20 mL/min — ABNORMAL LOW (ref >60)
Glucose, Bld: 85 mg/dL (ref 70–99)
Phosphorus: 3.5 mg/dL (ref 2.5–4.6)
Potassium: 3.9 mmol/L (ref 3.5–5.1)
Sodium: 135 mmol/L (ref 135–145)

## 2019-10-27 LAB — GLUCOSE, CAPILLARY
Glucose-Capillary: 81 mg/dL (ref 70–99)
Glucose-Capillary: 122 mg/dL — ABNORMAL HIGH (ref 70–99)
Glucose-Capillary: 137 mg/dL — ABNORMAL HIGH (ref 70–99)

## 2019-10-27 LAB — PH, BODY FLUID: pH, Body Fluid: 7.7

## 2019-10-27 LAB — PROTIME-INR
INR: 3 — ABNORMAL HIGH (ref 0.8–1.2)
Prothrombin Time: 30.3 seconds — ABNORMAL HIGH (ref 11.4–15.2)

## 2019-10-27 LAB — CYTOLOGY - NON PAP

## 2019-10-27 MED ORDER — SODIUM CHLORIDE 0.9% FLUSH: 3.0000 mL | INTRAVENOUS | Status: DC | PRN

## 2019-10-27 MED ORDER — SODIUM CHLORIDE 0.9% FLUSH: 3.0000 mL | Freq: Two times a day (BID) | INTRAVENOUS | Status: DC

## 2019-10-27 MED ORDER — METOPROLOL SUCCINATE ER 25 MG PO TB24
25.0000 mg | ORAL_TABLET | Freq: Every morning | ORAL | Status: DC
  Administered 2019-10-27 – 2019-11-02 (×7): 25 mg via ORAL
  Filled 2019-10-27 (×7): qty 1

## 2019-10-27 MED ORDER — ASPIRIN 81 MG PO CHEW
81.0000 mg | CHEWABLE_TABLET | ORAL | Status: AC
  Administered 2019-10-28: 81 mg via ORAL
  Filled 2019-10-27: qty 1

## 2019-10-27 MED ORDER — SODIUM CHLORIDE 0.9 % IV SOLN: INTRAVENOUS | Status: DC

## 2019-10-27 MED ORDER — SODIUM CHLORIDE 0.9 % IV SOLN: 250.0000 mL | INTRAVENOUS | Status: DC | PRN

## 2019-10-27 NOTE — Progress Notes (Addendum)
Patient is refusing care, including medications, stating "I don't like it down here" and "I just want to go home". MD notified. Patient remains A&Ox2.  Bed alarm has been turned on.

## 2019-10-27 NOTE — Progress Notes (Signed)
Verbal consent for cardiac catheterization obtained from Mary [name], husband and health care power of attorney, at 2215.

## 2019-10-27 NOTE — Progress Notes (Signed)
ANTICOAGULATION CONSULT NOTE -  Pharmacy Consult for warfarin Indication: mechanical AVR and afib  Allergies  Allergen Reactions  . Tape Itching    Redness, Please use "paper" tape only    Patient Measurements: Height: 5\' 6"  (167.6 cm) Weight: 69.4 kg (153 lb) IBW/kg (Calculated) : 59.3  Vital Signs: Temp: 98.7 F (37.1 C) (08/09 0511) BP: 89/51 (08/09 0511) Pulse Rate: 70 (08/09 0511)  Labs: Recent Labs    10/25/19 0607 10/26/19 0654 10/27/19 0600  HGB 9.2*  --   --   HCT 29.6*  --   --   PLT 133*  --   --   LABPROT 32.2* 30.2* 30.3*  INR 3.3* 3.0* 3.0*  CREATININE 2.18* 2.12* 2.26*    Estimated Creatinine Clearance: 18.3 mL/min (A) (by C-G formula based on SCr of 2.26 mg/dL (H)).   Medical History: Past Medical History:  Diagnosis Date  . Anxiety   . Diabetes mellitus with neuropathy ([place])   . Diabetic neuropathy ([place])   . DJD (degenerative joint disease)   . H/O aortic valve replacement   . Hyperlipidemia   . Hypertension   . Neuromuscular disorder (HCC)    neuropathy in feet  . Obesity   . Sleep apnea   . Stroke [facility])    " light stroke"  . Wears glasses     Medications:  PTA medications include warfarin 2 mg po qday  Assessment: Pharmacy consulted to dose warfarin for this patient with a history of mechanical AVR and afib.  Last warfarin 2 mg dose was 10/23/19. INR 3.0  Goal of Therapy:  INR 2.5-3.5 Monitor platelets by anticoagulation protocol: Yes   Plan:  Continue warfarin 2 mg daily Monitor daily INR for warfarin dose adjustments, CBC, s/s bleeding   Margot Ables, PharmD Clinical Pharmacist 10/27/2019 9:13 AM

## 2019-10-27 NOTE — Progress Notes (Signed)
Patient Demographics:    Mary [name], is a 79 y.o. female, DOB - 07-Jan-1941, LSL:[id number]  Admit date - 10/23/2019   Admitting Physician Hansel Devan Denton Brick, MD  Outpatient Primary MD for the patient is [name], MD  LOS - 4   Chief Complaint  Patient presents with  . Shortness of Breath        Subjective:    Mary [name] seen with cardiologist Dr. Dorris Carnes at bedside and patient's husband at bedside No CP -  No shob at rest Some dyspnea on exertion persist -Voiding okay but evolving into the toilet bowl making it difficult to get accurate input/output    Assessment  & Plan :    Principal Problem:   Acute exacerbation of dCHF (congestive heart failure) (HCC) Active Problems:   Essential hypertension   CVA (cerebral vascular accident) ([place])   S/P aortic valve replacement   Diabetes ([place])   OSA (obstructive sleep apnea)   CKD (chronic kidney disease), stage III   Acute combined systolic and diastolic congestive heart failure ([place])  Brief Summary:- 79 y.o. female with past medical history relevant for diastolic dysfunction CHF, HTN, status post aortic valve replacement/mechanical Saint Jude valve, 1995/1996, paroxysmal atrial fibrillation, history of prior stroke, history of DM, CKD 3b, OSA admitted on 10/23/2019 with acute on chronic diastolic dysfunction CHF exacerbation and ascites secondary to liver cirrhosis as well as AKI having failed outpatient attempt at oral diuresis   A/p 1)HFpEF/Acute on Chronic Diastolic Dysfunction CHF Exac--- in the Setting of pulmonary HTN and RV dysfunction - 04/2019 echo LVEF 60-65%, elevated LA pressure, moderate RV dysfunction, severe RVE, severe biatrial enlargement, mod MR, severe TR, severe pulm HTN -Repeat echo on 10/23/2019 with EF down to 55 to 60%, with severe pulmonary hypertension PASP of 65 -BNP 565 -PTA Patient continued to have with overload issues  despite taking torsemide 80mg  bid, metolazone 10mg  as needed--- her creatinine was trending up with attempt at diuresis as outpatient, she also developed ascites-- -continue Lasix IV 80 mg twice a day, daily weights and fluid input and output monitoring-----patient initially lost weight after therapeutic paracentesis, no further weight loss despite aggressive IV diuresis -Creatinine stable just above 2 with diuresis ---Plan to  [facility] [place] on 10/28/19 to help gauge  volume status and help direct therapy  2) status post aortic valve replacement with [facility] Jude AVR in 1995/1996--- continue Coumadin therapy for mechanical valve--echo without significant valvular problems at this time -Pharmacy to help manage Coumadin therapy  -INR 3.0, hold Coumadin in order to allow for RHC  3)OSA--- history of noncompliance with CPAP  4) history of paroxysmal A. fib and prior CVA--- on Coumadin as above #1, decrease Toprol-XL to 25 mg daily due to soft BP, heart rate stable  5) history of cirrhosis on prior imaging studies--- does not appear to have had work-up for cirrhosis, abdominal ultrasound consistent with liver cirrhosis with ascites  =-Ammonia is 53 , no significant encephalopathy--- c/n  lactulose prophylactically --Status post paracentesis on 10/24/2018 for removal of 4.2 L-- Fluid studies Gram stain and culture negative -Viral hepatitis profile negative, TSH normal, LDL 27 -Consider adding Aldactone to Lasix if renal function improves  6)AKI----acute kidney injury on CKD stage -  IIIb    creatinine on admission=2.20  , baseline creatinine =1.4     -Repeat creatinine currently above 2 ---renally adjust medications, avoid nephrotoxic agents / dehydration  / hypotension -Monitor renal function closely with aggressive diuresis  7) chronic anemia----Hgb currently stable >> 9 which is patient's baseline  8)DM2- Use Novolog/Humalog Sliding scale insulin with Accu-Cheks/Fingersticks  as ordered    Disposition/Need for in-Hospital Stay- patient unable to be discharged at this time due to --CHF exacerbation and worsening renal function requiring IV diuresis and close monitoring of renal function while diuresing patient, failed outpatient oral diuretics ----Plan to  transfer [facility] [place] on 10/28/19 to help gauge  volume status and help direct therapy  Status is: Inpatient  Remains inpatient appropriate because:HF exacerbation and worsening renal function requiring IV diuresis and close monitoring of renal function while diuresing patient, failed outpatient oral diuretics   Dispo: The patient is from: Home  Anticipated d/c is to: Home  Anticipated d/c date is: 2 days  Patient currently is not medically stable to d/c. Barriers: Not Clinically Stable- HF exacerbation and worsening renal function requiring IV diuresis and close monitoring of renal function while diuresing patient, failed outpatient oral diuretics ----Plan to  transfer [facility] [place] on 10/28/19 to help gauge  volume status and help direct therapy  Code Status : Full  Family Communication:   (patient is alert, awake and coherent)  =-Discussed with husband at bedside  Consults  :  cardiology  Procedures:- Status post paracentesis on 10/24/2018 for removal of 4.2 L--  DVT Prophylaxis  : Coumadin- SCDs   Lab Results  Component Value Date   PLT 133 (L) 10/25/2019    Inpatient Medications  Scheduled Meds: . fluticasone  1 spray Each Nare QHS  . furosemide  80 mg Intravenous Q12H  . HYDROcodone-acetaminophen  1 tablet Oral BID  . insulin aspart  0-5 Units Subcutaneous QHS  . insulin aspart  0-6 Units Subcutaneous TID WC  . lactulose  20 g Oral Daily  . loratadine  10 mg Oral Daily  . metoprolol succinate  100 mg Oral q morning - 10a  . pantoprazole  40 mg Oral QAC breakfast  . potassium chloride  10 mEq Oral q morning - 10a  . simvastatin   40 mg Oral Q2000  . sodium chloride flush  3 mL Intravenous Q12H  . vitamin B-12  500 mcg Oral Daily  . Vitamin D (Ergocalciferol)  50,000 Units Oral Q Sun   Continuous Infusions: . sodium chloride     PRN Meds:.sodium chloride, acetaminophen, albuterol, ALPRAZolam, bisacodyl, ondansetron **OR** ondansetron (ZOFRAN) IV, sodium chloride flush, traZODone    Anti-infectives (From admission, onward)   None        Objective:   Vitals:   10/26/19 1509 10/26/19 2030 10/27/19 0511 10/27/19 1018  BP: (!) 102/49 (!) 104/48 (!) 89/51 (!) 96/47  Pulse: 66 70 70 64  Resp: 18 16 17    Temp: 98.1 F (36.7 C) 98.4 F (36.9 C) 98.7 F (37.1 C)   TempSrc:      SpO2: 98% 97% 97%   Weight:   69.4 kg 68.7 kg  Height:        Wt Readings from Last 3 Encounters:  10/27/19 68.7 kg  10/23/19 73.5 kg  10/14/19 72 kg     Intake/Output Summary (Last 24 hours) at 10/27/2019 1223 Last data filed at 10/27/2019 1013 Gross per 24 hour  Intake 803 ml  Output  650 ml  Net 153 ml     Physical Exam  General exam: Alert, awake, oriented x 3 Respiratory system: Diminished in bases, no rales Cardiovascular system: S1, S2, irregular, prior sternotomy scar Gastrointestinal system: Abdomen is nondistended, soft and nontender. No organomegaly or masses felt. Normal bowel sounds heard. Central nervous system: Alert and oriented.  Generalized weakness, no focal neurological deficits. Extremities: Trace pitting edema, +pedal pulses Skin: No rashes, lesions or ulcers Psychiatry: Some forgetfulness, affect is appropriate    Data Review:   Micro Results Recent Results (from the past 240 hour(s))  SARS Coronavirus 2 by RT PCR (hospital order, performed in [facility] hospital lab) Nasopharyngeal Nasopharyngeal Swab     Status: None   Collection Time: 10/23/19 11:52 AM   Specimen: Nasopharyngeal Swab  Result Value Ref Range Status   SARS Coronavirus 2 NEGATIVE NEGATIVE Final    Comment:  (NOTE) SARS-CoV-2 target nucleic acids are NOT DETECTED.  The SARS-CoV-2 RNA is generally detectable in upper and lower respiratory specimens during the acute phase of infection. The lowest concentration of SARS-CoV-2 viral copies this assay can detect is 250 copies / mL. A negative result does not preclude SARS-CoV-2 infection and should not be used as the sole basis for treatment or other patient management decisions.  A negative result may occur with improper specimen collection / handling, submission of specimen other than nasopharyngeal swab, presence of viral mutation(s) within the areas targeted by this assay, and inadequate number of viral copies (<250 copies / mL). A negative result must be combined with clinical observations, patient history, and epidemiological information.  Fact Sheet for Patients:   StrictlyIdeas.no  Fact Sheet for Healthcare Providers: BankingDealers.co.za  This test is not yet approved or  cleared by the [place] FDA and has been authorized for detection and/or diagnosis of SARS-CoV-2 by FDA under an Emergency Use Authorization (EUA).  This EUA will remain in effect (meaning this test can be used) for the duration of the COVID-19 declaration under Section 564(b)(1) of the Act, 21 U.S.C. section 360bbb-3(b)(1), unless the authorization is terminated or revoked sooner.  Performed at [facility], [address] [place], [place] [address]   Gram stain     Status: None   Collection Time: 10/24/19  2:22 PM   Specimen: Ascitic; Body Fluid  Result Value Ref Range Status   Specimen Description ASCITIC  Final   Special Requests NONE  Final   Gram Stain   Final    CYTOSPIN SMEAR NO ORGANISMS SEEN WBC PRESENT, PREDOMINANTLY MONONUCLEAR Performed at [facility], [address], [place], [place] [address]    Report Status 10/24/2019 FINAL  Final  Culture, body fluid-bottle     Status: None (Preliminary  result)   Collection Time: 10/24/19  2:25 PM   Specimen: Ascitic  Result Value Ref Range Status   Specimen Description ASCITIC  Final   Special Requests 10CC  Final   Culture   Final    NO GROWTH < 24 HOURS Performed at [facility], [address] [place], [place] [address]    Report Status PENDING  Incomplete    Radiology Reports DG Chest 2 View  Result Date: 10/14/2019 CLINICAL DATA:  79 year old female with shortness of breath. EXAM: CHEST - 2 VIEW COMPARISON:  Chest radiograph dated 04/23/2019. FINDINGS: Small right pleural effusion with right lung base atelectasis or infiltrate similar to prior radiograph. There is background of emphysema. No pneumothorax. There is mild cardiomegaly with probable mild vascular congestion. Median  sternotomy wires. Atherosclerotic calcification of the aorta. No acute osseous pathology. IMPRESSION: 1. Stable right pleural effusion with right lung base atelectasis or infiltrate. 2. Cardiomegaly with probable mild vascular congestion. Electronically Signed   By: Anner Crete M.D.   On: 10/14/2019 18:10   US Abdomen Complete  Result Date: 10/24/2019 CLINICAL DATA:  Abdominal distension EXAM: ABDOMEN ULTRASOUND COMPLETE COMPARISON:  03/27/2016 FINDINGS: Gallbladder: Gallbladder is well distended without gallbladder wall thickening. Cholelithiasis and gallbladder sludge is seen. Common bile duct: Diameter: 3.1 mm. Liver: Liver is shrunken with a nodular appearance. No focal mass lesion is noted. Stable cyst is noted in the left lobe of the liver measuring 1.8 cm in greatest dimension. Portal vein is patent on color Doppler imaging with normal direction of blood flow towards the liver. IVC: No abnormality visualized. Pancreas: Visualized portion unremarkable. Spleen: Size and appearance within normal limits. Right Kidney: Length: 7.9 cm. Slight increased echogenicity is noted. Scarring is also noted in the upper pole of the right kidney. No obstructive changes  are seen. Left Kidney: Length: 9.4 cm. Echogenicity within normal limits. No mass or hydronephrosis visualized. Abdominal aorta: No aneurysm visualized. Other findings: Ascites is noted. Additionally a right-sided pleural effusion is noted. IMPRESSION: Changes consistent with cirrhosis of the liver with associated ascites. Right-sided pleural effusion. Left hepatic cyst stable from the prior study. Cholelithiasis and gallbladder sludge without complicating factors. Electronically Signed   By: Inez Catalina M.D.   On: 10/24/2019 09:02   US Paracentesis  Result Date: 10/24/2019 INDICATION: Cirrhosis, ascites EXAM: ULTRASOUND GUIDED DIAGNOSTIC AND THERAPEUTIC PARACENTESIS MEDICATIONS: None COMPLICATIONS: None immediate PROCEDURE: Informed written consent was obtained from the patient after a discussion of the risks, benefits and alternatives to treatment. A timeout was performed prior to the initiation of the procedure. Initial ultrasound scanning demonstrates a large amount of ascites within the LEFT lower abdominal quadrant. The right lower abdomen was prepped and draped in the usual sterile fashion. 1% lidocaine was used for local anesthesia. Following this, a 5 [place] Yueh catheter was introduced. An ultrasound image was saved for documentation purposes. The paracentesis was performed. The catheter was removed and a dressing was applied. The patient tolerated the procedure well without immediate post procedural complication. FINDINGS: A total of approximately 4.2 mL of yellow ascitic fluid was removed. Samples were sent to the laboratory as requested by the clinical team. IMPRESSION: Successful ultrasound-guided paracentesis yielding 4.2 L liters of peritoneal fluid. Electronically Signed   By: Lavonia Dana M.D.   On: 10/24/2019 15:13   DG Chest Portable 1 View  Result Date: 10/23/2019 CLINICAL DATA:  Evaluate for pulmonary edema EXAM: PORTABLE CHEST 1 VIEW COMPARISON:  10/14/2019 FINDINGS: Cardiomegaly. Prior  median sternotomy. Moderate right pleural effusion with right lower lobe atelectasis. Areas of scarring in the lungs bilaterally. No overt edema. No acute bony abnormality. IMPRESSION: Stable moderate right effusion with right base atelectasis. Cardiomegaly. Chronic changes.  No overt edema. Electronically Signed   By: Rolm Baptise M.D.   On: 10/23/2019 11:00   ECHOCARDIOGRAM COMPLETE  Result Date: 10/23/2019    ECHOCARDIOGRAM REPORT   Patient Name:   Mary [name] Date of Exam: 10/23/2019 Medical Rec #:  [id number]   Height:       66.0 in Accession #:    [id number]  Weight:       160.2 lb Date of Birth:  1940-04-03   BSA:          1.820 m Patient Age:  79 years    BP:           119/65 mmHg Patient Gender: F           HR:           71 bpm. Exam Location:  [facility] Procedure: 2D Echo Indications:    Dyspnea 786.09 / R06.00  History:        Patient has prior history of Echocardiogram examinations, most                 recent 04/24/2019. Stroke, Arrythmias:Atrial Fibrillation; Risk                 Factors:Non-Smoker, Dyslipidemia, Diabetes and Hypertension.                 CKD.  Sonographer:    Leavy Cella RDCS (AE) Referring Phys: [id number] [place]  1. Left ventricular ejection fraction, by estimation, is 55 to 60%. The left ventricle has normal function. The left ventricle has no regional wall motion abnormalities. Left ventricular diastolic parameters are indeterminate.  2. Right ventricular systolic function is severely reduced. The right ventricular size is severely enlarged. There is moderately elevated pulmonary artery systolic pressure.  3. Left atrial size was moderately dilated.  4. Right atrial size was severely dilated.  5. The mitral valve is normal in structure. Mild mitral valve regurgitation. No evidence of mitral stenosis.  6. The tricuspid valve is abnormal. Tricuspid valve regurgitation is severe.  7. There is a mechanical valve in the AV position. From medical record St Jude  valve, unknown type and size. . The aortic valve has been repaired/replaced. Aortic valve regurgitation is not visualized. No aortic stenosis is present.  8. Severe pulmonary HTN, PASP is 65 mmHg.  9. The inferior vena cava is dilated in size with <50% respiratory variability, suggesting right atrial pressure of 15 mmHg. FINDINGS  Left Ventricle: Left ventricular ejection fraction, by estimation, is 55 to 60%. The left ventricle has normal function. The left ventricle has no regional wall motion abnormalities. The left ventricular internal cavity size was small. There is no left ventricular hypertrophy. Left ventricular diastolic parameters are indeterminate. Right Ventricle: The right ventricular size is severely enlarged. No increase in right ventricular wall thickness. Right ventricular systolic function is severely reduced. There is moderately elevated pulmonary artery systolic pressure. The tricuspid regurgitant velocity is 3.46 m/s, and with an assumed right atrial pressure of 10 mmHg, the estimated right ventricular systolic pressure is 37.6 mmHg. Left Atrium: Left atrial size was moderately dilated. Right Atrium: Right atrial size was severely dilated. Pericardium: There is no evidence of pericardial effusion. Mitral Valve: The mitral valve is normal in structure. There is mild thickening of the mitral valve leaflet(s). There is mild calcification of the mitral valve leaflet(s). Mild mitral annular calcification. Mild mitral valve regurgitation. No evidence of  mitral valve stenosis. Tricuspid Valve: The tricuspid valve is abnormal. Tricuspid valve regurgitation is severe. No evidence of tricuspid stenosis. Aortic Valve: There is a mechanical valve in the AV position. From medical record St Jude valve, unknown type and size. The aortic valve has been repaired/replaced. Aortic valve regurgitation is not visualized. No aortic stenosis is present. Aortic valve  mean gradient measures 3.8 mmHg. Aortic valve peak  gradient measures 6.4 mmHg. Pulmonic Valve: The pulmonic valve was not well visualized. Pulmonic valve regurgitation is mild to moderate. No evidence of pulmonic stenosis. Aorta: The aortic root is normal in size and structure.  Pulmonary Artery: Severe pulmonary HTN, PASP is 65 mmHg. Venous: The inferior vena cava is dilated in size with less than 50% respiratory variability, suggesting right atrial pressure of 15 mmHg. IAS/Shunts: The interatrial septum was not well visualized.  LEFT VENTRICLE PLAX 2D LVIDd:         4.01 cm Diastology LVIDs:         3.50 cm LV e' lateral:   7.97 cm/s LV PW:         1.13 cm LV E/e' lateral: 14.1 LV IVS:        0.70 cm LV e' medial:    7.31 cm/s                        LV E/e' medial:  15.3  RIGHT VENTRICLE RV S prime:     4.05 cm/s TAPSE (M-mode): 0.8 cm LEFT ATRIUM             Index       RIGHT ATRIUM           Index LA diam:        3.60 cm 1.98 cm/m  RA Area:     18.80 cm LA Vol (A2C):   69.0 ml 37.92 ml/m RA Volume:   54.60 ml  30.00 ml/m LA Vol (A4C):   59.8 ml 32.86 ml/m LA Biplane Vol: 64.9 ml 35.66 ml/m  AORTIC VALVE AV Vmax:           126.67 cm/s AV Vmean:          93.430 cm/s AV VTI:            0.268 m AV Peak Grad:      6.4 mmHg AV Mean Grad:      3.8 mmHg LVOT Vmax:         64.97 cm/s LVOT Vmean:        44.995 cm/s LVOT VTI:          0.146 m LVOT/AV VTI ratio: 0.55  AORTA Ao Root diam: 2.30 cm MITRAL VALVE                TRICUSPID VALVE MV Area (PHT): 3.33 cm     TR Peak grad:   47.9 mmHg MV Decel Time: 228 msec     TR Vmax:        346.00 cm/s MV E velocity: 112.00 cm/s                             SHUNTS                             Systemic VTI: 0.15 m Carlyle Dolly MD Electronically signed by Carlyle Dolly MD Signature Date/Time: 10/23/2019/3:56:43 PM    Final      CBC Recent Labs  Lab 10/23/19 1103 10/24/19 0748 10/25/19 0607  WBC 4.6 4.4 4.0  HGB 9.5* 9.1* 9.2*  HCT 30.8* 28.7* 29.6*  PLT 145* 137* 133*  MCV 114.1* 112.5* 113.8*  MCH 35.2* 35.7*  35.4*  MCHC 30.8 31.7 31.1  RDW 16.4* 16.5* 16.7*    Chemistries  Recent Labs  Lab 10/23/19 1103 10/24/19 0748 10/25/19 0607 10/26/19 0654 10/27/19 0600  NA 136 136 136 136 135  K 3.4* 3.5 3.6 3.2* 3.9  CL 92* 93* 93* 93* 91*  CO2 30 30 32 31 32  GLUCOSE 112*  88 108* 86 85  BUN 61* 61* 56* 52* 53*  CREATININE 2.20* 2.18* 2.18* 2.12* 2.26*  CALCIUM 8.7* 8.6* 8.8* 8.7* 8.8*  MG 2.5*  --   --   --   --    ------------------------------------------------------------------------------------------------------------------ Recent Labs    10/25/19 0607  CHOL 67  HDL 28*  LDLCALC 27  TRIG 61  CHOLHDL 2.4    Lab Results  Component Value Date   HGBA1C 5.1 10/23/2019   ------------------------------------------------------------------------------------------------------------------ Recent Labs    10/25/19 0607  TSH 2.951   ------------------------------------------------------------------------------------------------------------------ No results for input(s): VITAMINB12, FOLATE, FERRITIN, TIBC, IRON, RETICCTPCT in the last 72 hours.  Coagulation profile Recent Labs  Lab 10/23/19 1103 10/24/19 0748 10/25/19 0607 10/26/19 0654 10/27/19 0600  INR 3.2* 3.1* 3.3* 3.0* 3.0*    No results for input(s): DDIMER in the last 72 hours.  Cardiac Enzymes No results for input(s): CKMB, TROPONINI, MYOGLOBIN in the last 168 hours.  Invalid input(s): CK ------------------------------------------------------------------------------------------------------------------    Component Value Date/Time   BNP 565.0 (H) 10/23/2019 1103     Roxan Hockey M.D on 10/27/2019 at 12:23 PM  Go to [email or web address] - for contact info  [facility] - Office  [phone]

## 2019-10-27 NOTE — Progress Notes (Signed)
Progress Note  Patient Name: JENAYE RICKERT Date of Encounter: 10/27/2019  Primary Cardiologist: Carlyle Dolly, MD   Subjective   Breathing is OK  No CP   "I want to go home"  Inpatient Medications    Scheduled Meds: . fluticasone  1 spray Each Nare QHS  . furosemide  80 mg Intravenous Q12H  . HYDROcodone-acetaminophen  1 tablet Oral BID  . insulin aspart  0-5 Units Subcutaneous QHS  . insulin aspart  0-6 Units Subcutaneous TID WC  . lactulose  20 g Oral Daily  . loratadine  10 mg Oral Daily  . metoprolol succinate  100 mg Oral q morning - 10a  . pantoprazole  40 mg Oral QAC breakfast  . potassium chloride  10 mEq Oral q morning - 10a  . simvastatin  40 mg Oral Q2000  . sodium chloride flush  3 mL Intravenous Q12H  . vitamin B-12  500 mcg Oral Daily  . Vitamin D (Ergocalciferol)  50,000 Units Oral Q Sun  . warfarin  2 mg Oral q1600  . Warfarin - Pharmacist Dosing Inpatient   Does not apply q1600   Continuous Infusions: . sodium chloride     PRN Meds: sodium chloride, acetaminophen, albuterol, ALPRAZolam, bisacodyl, ondansetron **OR** ondansetron (ZOFRAN) IV, sodium chloride flush, traZODone   Vital Signs    Vitals:   10/26/19 1509 10/26/19 2030 10/27/19 0511 10/27/19 1018  BP: (!) 102/49 (!) 104/48 (!) 89/51 (!) 96/47  Pulse: 66 70 70 64  Resp: 18 16 17    Temp: 98.1 F (36.7 C) 98.4 F (36.9 C) 98.7 F (37.1 C)   TempSrc:      SpO2: 98% 97% 97%   Weight:   69.4 kg 68.7 kg  Height:        Intake/Output Summary (Last 24 hours) at 10/27/2019 1037 Last data filed at 10/27/2019 1013 Gross per 24 hour  Intake 563 ml  Output 650 ml  Net -87 ml    Last 3 Weights 10/27/2019 10/27/2019 10/26/2019  Weight (lbs) 151 lb 7.3 oz 153 lb 153 lb 3.5 oz  Weight (kg) 68.7 kg 69.4 kg 69.5 kg      Telemetry    Afib 70s  . - Personally Reviewed  ECG    No new  .  - Personally Reviewed  Physical Exam   General: Well developed, elderly female appearing in no acute  distress. Head: Normocephalic, atraumatic.  Neck: Supple without bruits, JVD at 9 cm. Lungs:  Resp regular and unlabored, rales along bases bilaterally. Heart: Irregularly irregular, S1, S2, no S3  Gr II/VI SEM along RUSB.  +mech valve sounds   Abdomen: Soft, non-tender, non-distended with normoactive bowel sounds. No hepatomegaly. No rebound/guarding. No obvious abdominal masses. Extremities: No clubbing or cyanosis, Tr pitting edema bilaterally. Distal pedal pulses are 2+ bilaterally. Neuro: Alert and oriented X 3. Moves all extremities spontaneously. Psych: Normal affect.  Labs    Chemistry Recent Labs  Lab 10/20/19 1043 10/23/19 1103 10/25/19 0607 10/26/19 0654 10/27/19 0600  NA 135   < > 136 136 135  K 3.2*   < > 3.6 3.2* 3.9  CL 92*   < > 93* 93* 91*  CO2 31   < > 32 31 32  GLUCOSE 145*   < > 108* 86 85  BUN 60*   < > 56* 52* 53*  CREATININE 2.37*   < > 2.18* 2.12* 2.26*  CALCIUM 8.6*   < > 8.8* 8.7* 8.8*  PROT 8.0  --   --   --   --   ALBUMIN 2.6*  --   --  2.4* 2.5*  AST 24  --   --   --   --   ALT 9  --   --   --   --   ALKPHOS 67  --   --   --   --   BILITOT 1.5*  --   --   --   --   GFRNONAA 19*   < > 21* 21* 20*  GFRAA 22*   < > 24* 25* 23*  ANIONGAP 12   < > 11 12 12    < > = values in this interval not displayed.     Hematology Recent Labs  Lab 10/23/19 1103 10/24/19 0748 10/25/19 0607  WBC 4.6 4.4 4.0  RBC 2.70* 2.55* 2.60*  HGB 9.5* 9.1* 9.2*  HCT 30.8* 28.7* 29.6*  MCV 114.1* 112.5* 113.8*  MCH 35.2* 35.7* 35.4*  MCHC 30.8 31.7 31.1  RDW 16.4* 16.5* 16.7*  PLT 145* 137* 133*    Cardiac EnzymesNo results for input(s): TROPONINI in the last 168 hours. No results for input(s): TROPIPOC in the last 168 hours.   BNP Recent Labs  Lab 10/23/19 1103  BNP 565.0*     DDimer No results for input(s): DDIMER in the last 168 hours.   Radiology    US Abdomen Complete  Result Date: 10/24/2019 CLINICAL DATA:  Abdominal distension EXAM: ABDOMEN  ULTRASOUND COMPLETE COMPARISON:  03/27/2016 FINDINGS: Gallbladder: Gallbladder is well distended without gallbladder wall thickening. Cholelithiasis and gallbladder sludge is seen. Common bile duct: Diameter: 3.1 mm. Liver: Liver is shrunken with a nodular appearance. No focal mass lesion is noted. Stable cyst is noted in the left lobe of the liver measuring 1.8 cm in greatest dimension. Portal vein is patent on color Doppler imaging with normal direction of blood flow towards the liver. IVC: No abnormality visualized. Pancreas: Visualized portion unremarkable. Spleen: Size and appearance within normal limits. Right Kidney: Length: 7.9 cm. Slight increased echogenicity is noted. Scarring is also noted in the upper pole of the right kidney. No obstructive changes are seen. Left Kidney: Length: 9.4 cm. Echogenicity within normal limits. No mass or hydronephrosis visualized. Abdominal aorta: No aneurysm visualized. Other findings: Ascites is noted. Additionally a right-sided pleural effusion is noted. IMPRESSION: Changes consistent with cirrhosis of the liver with associated ascites. Right-sided pleural effusion. Left hepatic cyst stable from the prior study. Cholelithiasis and gallbladder sludge without complicating factors. Electronically Signed   By: Inez Catalina M.D.   On: 10/24/2019 09:02   DG Chest Portable 1 View  Result Date: 10/23/2019 CLINICAL DATA:  Evaluate for pulmonary edema EXAM: PORTABLE CHEST 1 VIEW COMPARISON:  10/14/2019 FINDINGS: Cardiomegaly. Prior median sternotomy. Moderate right pleural effusion with right lower lobe atelectasis. Areas of scarring in the lungs bilaterally. No overt edema. No acute bony abnormality. IMPRESSION: Stable moderate right effusion with right base atelectasis. Cardiomegaly. Chronic changes.  No overt edema. Electronically Signed   By: Rolm Baptise M.D.   On: 10/23/2019 11:00   ECHOCARDIOGRAM COMPLETE  Result Date: 10/23/2019    ECHOCARDIOGRAM REPORT   Patient  Name:   RAISA DITTO Date of Exam: 10/23/2019 Medical Rec #:  [id number]   Height:       66.0 in Accession #:    [id number]  Weight:       160.2 lb Date of Birth:  08/28/1940   BSA:  1.820 m Patient Age:    82 years    BP:           119/65 mmHg Patient Gender: F           HR:           71 bpm. Exam Location:  [facility] Procedure: 2D Echo Indications:    Dyspnea 786.09 / R06.00  History:        Patient has prior history of Echocardiogram examinations, most                 recent 04/24/2019. Stroke, Arrythmias:Atrial Fibrillation; Risk                 Factors:Non-Smoker, Dyslipidemia, Diabetes and Hypertension.                 CKD.  Sonographer:    Leavy Cella RDCS (AE) Referring Phys: [id number]  1. Left ventricular ejection fraction, by estimation, is 55 to 60%. The left ventricle has normal function. The left ventricle has no regional wall motion abnormalities. Left ventricular diastolic parameters are indeterminate.  2. Right ventricular systolic function is severely reduced. The right ventricular size is severely enlarged. There is moderately elevated pulmonary artery systolic pressure.  3. Left atrial size was moderately dilated.  4. Right atrial size was severely dilated.  5. The mitral valve is normal in structure. Mild mitral valve regurgitation. No evidence of mitral stenosis.  6. The tricuspid valve is abnormal. Tricuspid valve regurgitation is severe.  7. There is a mechanical valve in the AV position. From medical record St Jude valve, unknown type and size. . The aortic valve has been repaired/replaced. Aortic valve regurgitation is not visualized. No aortic stenosis is present.  8. Severe pulmonary HTN, PASP is 65 mmHg.  9. The inferior vena cava is dilated in size with <50% respiratory variability, suggesting right atrial pressure of 15 mmHg. FINDINGS  Left Ventricle: Left ventricular ejection fraction, by estimation, is 55 to 60%. The left ventricle has normal  function. The left ventricle has no regional wall motion abnormalities. The left ventricular internal cavity size was small. There is no left ventricular hypertrophy. Left ventricular diastolic parameters are indeterminate. Right Ventricle: The right ventricular size is severely enlarged. No increase in right ventricular wall thickness. Right ventricular systolic function is severely reduced. There is moderately elevated pulmonary artery systolic pressure. The tricuspid regurgitant velocity is 3.46 m/s, and with an assumed right atrial pressure of 10 mmHg, the estimated right ventricular systolic pressure is 78.2 mmHg. Left Atrium: Left atrial size was moderately dilated. Right Atrium: Right atrial size was severely dilated. Pericardium: There is no evidence of pericardial effusion. Mitral Valve: The mitral valve is normal in structure. There is mild thickening of the mitral valve leaflet(s). There is mild calcification of the mitral valve leaflet(s). Mild mitral annular calcification. Mild mitral valve regurgitation. No evidence of  mitral valve stenosis. Tricuspid Valve: The tricuspid valve is abnormal. Tricuspid valve regurgitation is severe. No evidence of tricuspid stenosis. Aortic Valve: There is a mechanical valve in the AV position. From medical record St Jude valve, unknown type and size. The aortic valve has been repaired/replaced. Aortic valve regurgitation is not visualized. No aortic stenosis is present. Aortic valve  mean gradient measures 3.8 mmHg. Aortic valve peak gradient measures 6.4 mmHg. Pulmonic Valve: The pulmonic valve was not well visualized. Pulmonic valve regurgitation is mild to moderate. No evidence of pulmonic stenosis. Aorta: The aortic root  is normal in size and structure. Pulmonary Artery: Severe pulmonary HTN, PASP is 65 mmHg. Venous: The inferior vena cava is dilated in size with less than 50% respiratory variability, suggesting right atrial pressure of 15 mmHg. IAS/Shunts: The  interatrial septum was not well visualized.  LEFT VENTRICLE PLAX 2D LVIDd:         4.01 cm Diastology LVIDs:         3.50 cm LV e' lateral:   7.97 cm/s LV PW:         1.13 cm LV E/e' lateral: 14.1 LV IVS:        0.70 cm LV e' medial:    7.31 cm/s                        LV E/e' medial:  15.3  RIGHT VENTRICLE RV S prime:     4.05 cm/s TAPSE (M-mode): 0.8 cm LEFT ATRIUM             Index       RIGHT ATRIUM           Index LA diam:        3.60 cm 1.98 cm/m  RA Area:     18.80 cm LA Vol (A2C):   69.0 ml 37.92 ml/m RA Volume:   54.60 ml  30.00 ml/m LA Vol (A4C):   59.8 ml 32.86 ml/m LA Biplane Vol: 64.9 ml 35.66 ml/m  AORTIC VALVE AV Vmax:           126.67 cm/s AV Vmean:          93.430 cm/s AV VTI:            0.268 m AV Peak Grad:      6.4 mmHg AV Mean Grad:      3.8 mmHg LVOT Vmax:         64.97 cm/s LVOT Vmean:        44.995 cm/s LVOT VTI:          0.146 m LVOT/AV VTI ratio: 0.55  AORTA Ao Root diam: 2.30 cm MITRAL VALVE                TRICUSPID VALVE MV Area (PHT): 3.33 cm     TR Peak grad:   47.9 mmHg MV Decel Time: 228 msec     TR Vmax:        346.00 cm/s MV E velocity: 112.00 cm/s                             SHUNTS                             Systemic VTI: 0.15 m Carlyle Dolly MD Electronically signed by Carlyle Dolly MD Signature Date/Time: 10/23/2019/3:56:43 PM    Final     Cardiac Studies   Echocardiogram: 10/23/2019 IMPRESSIONS    1. Left ventricular ejection fraction, by estimation, is 55 to 60%. The  left ventricle has normal function. The left ventricle has no regional  wall motion abnormalities. Left ventricular diastolic parameters are  indeterminate.  2. Right ventricular systolic function is severely reduced. The right  ventricular size is severely enlarged. There is moderately elevated  pulmonary artery systolic pressure.  3. Left atrial size was moderately dilated.  4. Right atrial size was severely dilated.  5. The mitral valve is normal in structure.  Mild mitral valve   regurgitation. No evidence of mitral stenosis.  6. The tricuspid valve is abnormal. Tricuspid valve regurgitation is  severe.  7. There is a mechanical valve in the AV position. From medical record St  Jude valve, unknown type and size. . The aortic valve has been  repaired/replaced. Aortic valve regurgitation is not visualized. No aortic  stenosis is present.  8. Severe pulmonary HTN, PASP is 65 mmHg.  9. The inferior vena cava is dilated in size with <50% respiratory  variability, suggesting right atrial pressure of 15 mmHg.   Patient Profile     79 y.o. female w/ PMH of chronic diastolic CHF, persistent atrial fibrillation, mechanical AVR in 1995, HTN, HLD, and OSA who was sent to the Emergency Department from the office on 10/23/2019 for an acute CHF exacerbation and further evaluation of her cirrhosis.   Assessment & Plan    1. 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    Neet to confirm R sided pressures to help guide if patient at optimal fluid balance    2. Persistent Atrial Fibrillation   Rates are controlled    3. Aortic Valve Replacement - She is s/p mechanical AVR in 1995. Repeat echo this admission shows no evidence of regurgitation or stenosis. Remains on Coumadin for anticoagulation. INR 3.0 this AM. Will hold coumadin tonight    Reassess INR in am.  Tentative plan for R heart cath tomorrow  4. Pulmonary HTN - PASP elevated to 65 m Hg by echo this admission. Noncompliant with CPAP as an outpatient.   Plan for R heart cath to further define    5. Cirrhosis - Abdominal US shows changes consistent with cirrhosis of the liver with associated ascites. She has been started on Lactulose. Further management per admitting team.   6. AKI - Baseline creatinine 1.3 - 1.4.  Now  2.26   Need to define pressures with RHC to guide optimal Rx   Pt's husband has power of attorney   He agrrees to proceeding with procedure to help guide Rx.  For questions or updates, please contact [place] Please consult [email or web address] for contact info under Cardiology/STEMI.   Richrd Humbles , PA-C 10:37 AM 10/27/2019 Pager: [phone]

## 2019-10-28 ENCOUNTER — Encounter (HOSPITAL_COMMUNITY): Payer: Self-pay | Admitting: Cardiology

## 2019-10-28 ENCOUNTER — Encounter (HOSPITAL_COMMUNITY)
Admission: EM | Disposition: A | Discharge status: Home / Self Care | Payer: Self-pay | Source: Home / Self Care | Attending: Internal Medicine

## 2019-10-28 ENCOUNTER — Inpatient Hospital Stay (HOSPITAL_COMMUNITY): Payer: Medicare Other

## 2019-10-28 DIAGNOSIS — I2729 Other secondary pulmonary hypertension: Secondary | ICD-10-CM

## 2019-10-28 DIAGNOSIS — I272 Pulmonary hypertension, unspecified: Secondary | ICD-10-CM

## 2019-10-28 HISTORY — PX: RIGHT HEART CATH: CATH118263

## 2019-10-28 LAB — BASIC METABOLIC PANEL
Anion gap: 12 (ref 5–15)
BUN: 44 mg/dL — ABNORMAL HIGH (ref 8–23)
CO2: 32 mmol/L (ref 22–32)
Calcium: 8.7 mg/dL — ABNORMAL LOW (ref 8.9–10.3)
Chloride: 92 mmol/L — ABNORMAL LOW (ref 98–111)
Creatinine, Ser: 2.11 mg/dL — ABNORMAL HIGH (ref 0.44–1.00)
GFR calc Af Amer: 25 mL/min — ABNORMAL LOW (ref >60)
GFR calc non Af Amer: 21 mL/min — ABNORMAL LOW (ref >60)
Glucose, Bld: 100 mg/dL — ABNORMAL HIGH (ref 70–99)
Potassium: 3.5 mmol/L (ref 3.5–5.1)
Sodium: 136 mmol/L (ref 135–145)

## 2019-10-28 LAB — GLUCOSE, CAPILLARY
Glucose-Capillary: 148 mg/dL — ABNORMAL HIGH (ref 70–99)
Glucose-Capillary: 82 mg/dL (ref 70–99)
Glucose-Capillary: 91 mg/dL (ref 70–99)
Glucose-Capillary: 167 mg/dL — ABNORMAL HIGH (ref 70–99)
Glucose-Capillary: 84 mg/dL (ref 70–99)

## 2019-10-28 LAB — CBC
HCT: 27.7 % — ABNORMAL LOW (ref 36.0–46.0)
Hemoglobin: 8.7 g/dL — ABNORMAL LOW (ref 12.0–15.0)
MCH: 34.5 pg — ABNORMAL HIGH (ref 26.0–34.0)
MCHC: 31.4 g/dL (ref 30.0–36.0)
MCV: 109.9 fL — ABNORMAL HIGH (ref 80.0–100.0)
Platelets: 132 10*3/uL — ABNORMAL LOW (ref 150–400)
RBC: 2.52 MIL/uL — ABNORMAL LOW (ref 3.87–5.11)
RDW: 16.5 % — ABNORMAL HIGH (ref 11.5–15.5)
WBC: 4.7 10*3/uL (ref 4.0–10.5)
nRBC: 0 % (ref 0.0–0.2)

## 2019-10-28 LAB — PROTIME-INR
INR: 2.8 — ABNORMAL HIGH (ref 0.8–1.2)
Prothrombin Time: 28.4 seconds — ABNORMAL HIGH (ref 11.4–15.2)

## 2019-10-28 SURGERY — RIGHT HEART CATH

## 2019-10-28 MED ORDER — HEPARIN (PORCINE) IN NACL 1000-0.9 UT/500ML-% IV SOLN
INTRAVENOUS | Status: AC
  Filled 2019-10-28: qty 500

## 2019-10-28 MED ORDER — INSULIN ASPART 100 UNIT/ML ~~LOC~~ SOLN
0.0000 [IU] | Freq: Three times a day (TID) | SUBCUTANEOUS | Status: DC
  Administered 2019-10-31: 2 [IU] via SUBCUTANEOUS
  Administered 2019-11-01: 3 [IU] via SUBCUTANEOUS

## 2019-10-28 MED ORDER — SODIUM CHLORIDE 0.9% FLUSH: 3.0000 mL | INTRAVENOUS | Status: DC | PRN

## 2019-10-28 MED ORDER — LIDOCAINE HCL (PF) 1 % IJ SOLN
INTRAMUSCULAR | Status: AC
  Filled 2019-10-28: qty 30

## 2019-10-28 MED ORDER — NITROGLYCERIN 1 MG/10 ML FOR IR/CATH LAB
INTRA_ARTERIAL | Status: DC | PRN
  Administered 2019-10-28: 200 ug via INTRAVENOUS

## 2019-10-28 MED ORDER — WARFARIN - PHARMACIST DOSING INPATIENT: Freq: Every day | Status: DC

## 2019-10-28 MED ORDER — WARFARIN SODIUM 3 MG PO TABS
3.0000 mg | ORAL_TABLET | ORAL | Status: AC
  Administered 2019-10-28: 3 mg via ORAL
  Filled 2019-10-28: qty 1

## 2019-10-28 MED ORDER — FUROSEMIDE 10 MG/ML IJ SOLN
80.0000 mg | Freq: Two times a day (BID) | INTRAMUSCULAR | Status: DC
  Administered 2019-10-29 – 2019-11-01 (×7): 80 mg via INTRAVENOUS
  Filled 2019-10-28 (×7): qty 8

## 2019-10-28 MED ORDER — SODIUM CHLORIDE 0.9 % IV SOLN: 250.0000 mL | INTRAVENOUS | Status: DC | PRN

## 2019-10-28 MED ORDER — HEPARIN (PORCINE) IN NACL 1000-0.9 UT/500ML-% IV SOLN
INTRAVENOUS | Status: DC | PRN
  Administered 2019-10-28: 500 mL

## 2019-10-28 MED ORDER — SODIUM CHLORIDE 0.9% FLUSH
3.0000 mL | Freq: Two times a day (BID) | INTRAVENOUS | Status: DC
  Administered 2019-10-28: 3 mL via INTRAVENOUS

## 2019-10-28 MED ORDER — LIDOCAINE HCL (PF) 1 % IJ SOLN
INTRAMUSCULAR | Status: DC | PRN
  Administered 2019-10-28: 2 mL
  Administered 2019-10-28: 1 mL

## 2019-10-28 MED ORDER — NITROGLYCERIN 1 MG/10 ML FOR IR/CATH LAB
INTRA_ARTERIAL | Status: AC
  Filled 2019-10-28: qty 10

## 2019-10-28 SURGICAL SUPPLY — 9 items
CATH BALLN WEDGE 5F 110CM (CATHETERS) ×1 IMPLANT
GUIDEWIRE .025 260CM (WIRE) ×1 IMPLANT
PROTECTION STATION PRESSURIZED (MISCELLANEOUS) ×2
SHEATH GLIDE SLENDER 4/5FR (SHEATH) ×2 IMPLANT
SHEATH PROBE COVER 6X72 (BAG) ×1 IMPLANT
STATION PROTECTION PRESSURIZED (MISCELLANEOUS) IMPLANT
TRANSDUCER W/STOPCOCK (MISCELLANEOUS) ×1 IMPLANT
TUBING ART PRESS 72  MALE/FEM (TUBING) ×2
TUBING ART PRESS 72 MALE/FEM (TUBING) IMPLANT

## 2019-10-28 NOTE — Progress Notes (Signed)
Unsuccessfully attempted to inform cath lab of patient refusal of procedure.

## 2019-10-28 NOTE — Progress Notes (Signed)
Progress Note  Patient Name: Mary [name] Date of Encounter: 10/28/2019  Primary Cardiologist: Carlyle Dolly, MD   Subjective   Breathing ok, husband present. They have questions about the procedure. Also wonder if she needs another paracentesis.   Inpatient Medications    Scheduled Meds: . fluticasone  1 spray Each Nare QHS  . furosemide  80 mg Intravenous Q12H  . HYDROcodone-acetaminophen  1 tablet Oral BID  . insulin aspart  0-5 Units Subcutaneous QHS  . insulin aspart  0-6 Units Subcutaneous TID WC  . lactulose  20 g Oral Daily  . loratadine  10 mg Oral Daily  . metoprolol succinate  25 mg Oral q morning - 10a  . pantoprazole  40 mg Oral QAC breakfast  . potassium chloride  10 mEq Oral q morning - 10a  . simvastatin  40 mg Oral Q2000  . sodium chloride flush  3 mL Intravenous Q12H  . sodium chloride flush  3 mL Intravenous Q12H  . vitamin B-12  500 mcg Oral Daily  . Vitamin D (Ergocalciferol)  50,000 Units Oral Q Sun   Continuous Infusions: . sodium chloride    . sodium chloride    . sodium chloride     PRN Meds: sodium chloride, sodium chloride, acetaminophen, albuterol, ALPRAZolam, bisacodyl, ondansetron **OR** ondansetron (ZOFRAN) IV, sodium chloride flush, sodium chloride flush, traZODone   Vital Signs    Vitals:   10/28/19 0114 10/28/19 0301 10/28/19 0403 10/28/19 0749  BP: (!) 94/39  (!) 104/56 (!) 107/53  Pulse: 76  80 72  Resp: 17  16 16   Temp: 98.4 F (36.9 C)  98.3 F (36.8 C) 98.2 F (36.8 C)  TempSrc: Oral  Oral Oral  SpO2: 97%  95% 92%  Weight:  62.3 kg    Height:        Intake/Output Summary (Last 24 hours) at 10/28/2019 0755 Last data filed at 10/28/2019 0408 Gross per 24 hour  Intake 243 ml  Output 250 ml  Net -7 ml    Last 3 Weights 10/28/2019 10/27/2019 10/27/2019  Weight (lbs) 137 lb 5.6 oz 151 lb 7.3 oz 153 lb  Weight (kg) 62.3 kg 68.7 kg 69.4 kg      Telemetry    Afib, rate controlled - Personally Reviewed  ECG    No new   .  - Personally Reviewed  Physical Exam   General: frail, elderly, female in no acute distress Head: Eyes PERRLA, Head normocephalic and atraumatic Lungs: rales bases bilaterally to auscultation. Heart: slightly irregular R&R, S1 S2, without rub or gallop. 2/6 murmur, crisp valve click. 4/4 extremity pulses are 2+ & equal. JVD to jaw Abdomen: Bowel sounds are present, abdomen soft and non-tender without masses or  hernias noted. Msk: weak strength and tone for age. Extremities: No clubbing, cyanosis or edema.    Skin:  No rashes or lesions noted. Neuro: Alert and oriented X 3. Psych:  Good affect, responds appropriately, gets anxious easily, anxious about the procedure  Labs    Chemistry Recent Labs  Lab 10/26/19 0654 10/27/19 0600 10/28/19 0455  NA 136 135 136  K 3.2* 3.9 3.5  CL 93* 91* 92*  CO2 31 32 32  GLUCOSE 86 85 100*  BUN 52* 53* 44*  CREATININE 2.12* 2.26* 2.11*  CALCIUM 8.7* 8.8* 8.7*  ALBUMIN 2.4* 2.5*  --   GFRNONAA 21* 20* 21*  GFRAA 25* 23* 25*  ANIONGAP 12 12 12     Lab Results  Component  Value Date   ALT 9 10/20/2019   AST 24 10/20/2019   ALKPHOS 67 10/20/2019   BILITOT 1.5 (H) 10/20/2019   Hematology Recent Labs  Lab 10/24/19 0748 10/25/19 0607 10/28/19 0455  WBC 4.4 4.0 4.7  RBC 2.55* 2.60* 2.52*  HGB 9.1* 9.2* 8.7*  HCT 28.7* 29.6* 27.7*  MCV 112.5* 113.8* 109.9*  MCH 35.7* 35.4* 34.5*  MCHC 31.7 31.1 31.4  RDW 16.5* 16.7* 16.5*  PLT 137* 133* 132*    Cardiac Enzymes High Sensitivity Troponin:  No results for input(s): TROPONINIHS in the last 720 hours.     BNP Recent Labs  Lab 10/23/19 1103  BNP 565.0*    Lab Results  Component Value Date   INR 2.8 (H) 10/28/2019   INR 3.0 (H) 10/27/2019   INR 3.0 (H) 10/26/2019   Ammonia  Date Value Ref Range Status  10/23/2019 53 (H) 9 - 35 umol/L Final    Comment:    Performed at [facility], [address], [place], [place] [address]    Radiology    US Abdomen  Complete  Result Date: 10/24/2019 CLINICAL DATA:  Abdominal distension EXAM: ABDOMEN ULTRASOUND COMPLETE COMPARISON:  03/27/2016 FINDINGS: Gallbladder: Gallbladder is well distended without gallbladder wall thickening. Cholelithiasis and gallbladder sludge is seen. Common bile duct: Diameter: 3.1 mm. Liver: Liver is shrunken with a nodular appearance. No focal mass lesion is noted. Stable cyst is noted in the left lobe of the liver measuring 1.8 cm in greatest dimension. Portal vein is patent on color Doppler imaging with normal direction of blood flow towards the liver. IVC: No abnormality visualized. Pancreas: Visualized portion unremarkable. Spleen: Size and appearance within normal limits. Right Kidney: Length: 7.9 cm. Slight increased echogenicity is noted. Scarring is also noted in the upper pole of the right kidney. No obstructive changes are seen. Left Kidney: Length: 9.4 cm. Echogenicity within normal limits. No mass or hydronephrosis visualized. Abdominal aorta: No aneurysm visualized. Other findings: Ascites is noted. Additionally a right-sided pleural effusion is noted. IMPRESSION: Changes consistent with cirrhosis of the liver with associated ascites. Right-sided pleural effusion. Left hepatic cyst stable from the prior study. Cholelithiasis and gallbladder sludge without complicating factors. Electronically Signed   By: Inez Catalina M.D.   On: 10/24/2019 09:02   DG Chest Portable 1 View  Result Date: 10/23/2019 CLINICAL DATA:  Evaluate for pulmonary edema EXAM: PORTABLE CHEST 1 VIEW COMPARISON:  10/14/2019 FINDINGS: Cardiomegaly. Prior median sternotomy. Moderate right pleural effusion with right lower lobe atelectasis. Areas of scarring in the lungs bilaterally. No overt edema. No acute bony abnormality. IMPRESSION: Stable moderate right effusion with right base atelectasis. Cardiomegaly. Chronic changes.  No overt edema. Electronically Signed   By: Rolm Baptise M.D.   On: 10/23/2019 11:00    ECHOCARDIOGRAM COMPLETE  Result Date: 10/23/2019    ECHOCARDIOGRAM REPORT   Patient Name:   KATRIANNA FRIESENHAHN Date of Exam: 10/23/2019 Medical Rec #:  [id number]   Height:       66.0 in Accession #:    [id number]  Weight:       160.2 lb Date of Birth:  [birth year]   BSA:          1.820 m Patient Age:    79 years    BP:           119/65 mmHg Patient Gender: F           HR:           71  bpm. Exam Location:  [facility] Procedure: 2D Echo Indications:    Dyspnea 786.09 / R06.00  History:        Patient has prior history of Echocardiogram examinations, most                 recent 04/24/2019. Stroke, Arrythmias:Atrial Fibrillation; Risk                 Factors:Non-Smoker, Dyslipidemia, Diabetes and Hypertension.                 CKD.  Sonographer:    Leavy Cella RDCS (AE) Referring Phys: [id number] [place]  1. Left ventricular ejection fraction, by estimation, is 55 to 60%. The left ventricle has normal function. The left ventricle has no regional wall motion abnormalities. Left ventricular diastolic parameters are indeterminate.  2. Right ventricular systolic function is severely reduced. The right ventricular size is severely enlarged. There is moderately elevated pulmonary artery systolic pressure.  3. Left atrial size was moderately dilated.  4. Right atrial size was severely dilated.  5. The mitral valve is normal in structure. Mild mitral valve regurgitation. No evidence of mitral stenosis.  6. The tricuspid valve is abnormal. Tricuspid valve regurgitation is severe.  7. There is a mechanical valve in the AV position. From medical record St Jude valve, unknown type and size. . The aortic valve has been repaired/replaced. Aortic valve regurgitation is not visualized. No aortic stenosis is present.  8. Severe pulmonary HTN, PASP is 65 mmHg.  9. The inferior vena cava is dilated in size with <50% respiratory variability, suggesting right atrial pressure of 15 mmHg. FINDINGS  Left Ventricle: Left  ventricular ejection fraction, by estimation, is 55 to 60%. The left ventricle has normal function. The left ventricle has no regional wall motion abnormalities. The left ventricular internal cavity size was small. There is no left ventricular hypertrophy. Left ventricular diastolic parameters are indeterminate. Right Ventricle: The right ventricular size is severely enlarged. No increase in right ventricular wall thickness. Right ventricular systolic function is severely reduced. There is moderately elevated pulmonary artery systolic pressure. The tricuspid regurgitant velocity is 3.46 m/s, and with an assumed right atrial pressure of 10 mmHg, the estimated right ventricular systolic pressure is 73.5 mmHg. Left Atrium: Left atrial size was moderately dilated. Right Atrium: Right atrial size was severely dilated. Pericardium: There is no evidence of pericardial effusion. Mitral Valve: The mitral valve is normal in structure. There is mild thickening of the mitral valve leaflet(s). There is mild calcification of the mitral valve leaflet(s). Mild mitral annular calcification. Mild mitral valve regurgitation. No evidence of  mitral valve stenosis. Tricuspid Valve: The tricuspid valve is abnormal. Tricuspid valve regurgitation is severe. No evidence of tricuspid stenosis. Aortic Valve: There is a mechanical valve in the AV position. From medical record St Jude valve, unknown type and size. The aortic valve has been repaired/replaced. Aortic valve regurgitation is not visualized. No aortic stenosis is present. Aortic valve  mean gradient measures 3.8 mmHg. Aortic valve peak gradient measures 6.4 mmHg. Pulmonic Valve: The pulmonic valve was not well visualized. Pulmonic valve regurgitation is mild to moderate. No evidence of pulmonic stenosis. Aorta: The aortic root is normal in size and structure. Pulmonary Artery: Severe pulmonary HTN, PASP is 65 mmHg. Venous: The inferior vena cava is dilated in size with less than 50%  respiratory variability, suggesting right atrial pressure of 15 mmHg. IAS/Shunts: The interatrial septum was not well visualized.  LEFT VENTRICLE PLAX  2D LVIDd:         4.01 cm Diastology LVIDs:         3.50 cm LV e' lateral:   7.97 cm/s LV PW:         1.13 cm LV E/e' lateral: 14.1 LV IVS:        0.70 cm LV e' medial:    7.31 cm/s                        LV E/e' medial:  15.3  RIGHT VENTRICLE RV S prime:     4.05 cm/s TAPSE (M-mode): 0.8 cm LEFT ATRIUM             Index       RIGHT ATRIUM           Index LA diam:        3.60 cm 1.98 cm/m  RA Area:     18.80 cm LA Vol (A2C):   69.0 ml 37.92 ml/m RA Volume:   54.60 ml  30.00 ml/m LA Vol (A4C):   59.8 ml 32.86 ml/m LA Biplane Vol: 64.9 ml 35.66 ml/m  AORTIC VALVE AV Vmax:           126.67 cm/s AV Vmean:          93.430 cm/s AV VTI:            0.268 m AV Peak Grad:      6.4 mmHg AV Mean Grad:      3.8 mmHg LVOT Vmax:         64.97 cm/s LVOT Vmean:        44.995 cm/s LVOT VTI:          0.146 m LVOT/AV VTI ratio: 0.55  AORTA Ao Root diam: 2.30 cm MITRAL VALVE                TRICUSPID VALVE MV Area (PHT): 3.33 cm     TR Peak grad:   47.9 mmHg MV Decel Time: 228 msec     TR Vmax:        346.00 cm/s MV E velocity: 112.00 cm/s                             SHUNTS                             Systemic VTI: 0.15 m Carlyle Dolly MD Electronically signed by Carlyle Dolly MD Signature Date/Time: 10/23/2019/3:56:43 PM    Final     Cardiac Studies   Echocardiogram: 10/23/2019 IMPRESSIONS    1. Left ventricular ejection fraction, by estimation, is 55 to 60%. The  left ventricle has normal function. The left ventricle has no regional  wall motion abnormalities. Left ventricular diastolic parameters are  indeterminate.  2. Right ventricular systolic function is severely reduced. The right  ventricular size is severely enlarged. There is moderately elevated  pulmonary artery systolic pressure.  3. Left atrial size was moderately dilated.  4. Right atrial size was  severely dilated.  5. The mitral valve is normal in structure. Mild mitral valve  regurgitation. No evidence of mitral stenosis.  6. The tricuspid valve is abnormal. Tricuspid valve regurgitation is  severe.  7. There is a mechanical valve in the AV position. From medical record St  Jude valve, unknown type and size. . The aortic valve  has been  repaired/replaced. Aortic valve regurgitation is not visualized. No aortic  stenosis is present.  8. Severe pulmonary HTN, PASP is 65 mmHg.  9. The inferior vena cava is dilated in size with <50% respiratory  variability, suggesting right atrial pressure of 15 mmHg.   Patient Profile     79 y.o. female w/ PMH of chronic diastolic CHF, persistent atrial fibrillation, mechanical AVR in 1995, HTN, HLD, and OSA who was sent to the Emergency Department from the office on 10/23/2019 for an acute CHF exacerbation and further evaluation of her cirrhosis.   Assessment & Plan    1. Acute on Chronic Diastolic CHF/ R sided CHF  - ?anasarca or cor pulmonale, w/ RV dysfunction and pulm HTN on echo (nl 2017) - for R heart cath today, all questions answered to their satisfaction - s/p paracentesis 08/06 w/ 4.2 L off>>recheck abd [place] - R heart cath will greatly help guide treatment, pt and husband agree to the procedure  2. Persistent Atrial Fibrillation - controlled rate  3. Aortic Valve Replacement - mech AVR 1995, functioning well on echo  - coumadin therapeutic  4. Pulmonary HTN - PASP 65 mmHg by echo 08/05 - non-compliant w/ CPAP as outpt, ?O2 qhs - R heart cath today  5. Cirrhosis - cirrhosis and ascites seen - LFTs ok except alb 2.5, T bili 1.5 - ammonia 53 on 08/05 - on Lactulose - per IM  6. AKI - Cr 1.42 on 05/13/2019, was 2.18 on 07/12 and was 2.37 on 08/02 - R heart cath will help guide therapy  Pt's husband has power of attorney   He agrrees to proceeding with procedure to help guide Rx. Otherwise, per IM Principal Problem:    Acute exacerbation of dCHF (congestive heart failure) (HCC) Active Problems:   Essential hypertension   CVA (cerebral vascular accident) ([place])   S/P aortic valve replacement   Diabetes (HCC)   OSA (obstructive sleep apnea)   CKD (chronic kidney disease), stage III   Acute combined systolic and diastolic congestive heart failure ([place])    For questions or updates, please contact [facility] Please consult [email or web address] for contact info under Cardiology/STEMI.   Jonetta Speak , PA-C 7:55 AM 10/28/2019 Pager: [phone]

## 2019-10-28 NOTE — Progress Notes (Signed)
Cath Lab informed patient refusal sign consent form her husband and her thought having paracentesis not heart cath,Rn cath lab area reported would have PA come talk them clear up confusion.

## 2019-10-28 NOTE — Progress Notes (Signed)
ANTICOAGULATION CONSULT NOTE - Follow-Up  Pharmacy Consult for warfarin Indication: mechanical AVR and afib  Allergies  Allergen Reactions   Tape Itching    Redness, Please use "paper" tape only    Patient Measurements: Height: 5\' 6"  (167.6 cm) Weight: 62.3 kg (137 lb 5.6 oz) IBW/kg (Calculated) : 59.3  Vital Signs: Temp: 98.5 F (36.9 C) (08/10 1556) Temp Source: Oral (08/10 1556) BP: 89/52 (08/10 1556) Pulse Rate: 78 (08/10 1556)  Labs: Recent Labs    10/26/19 0654 10/27/19 0600 10/28/19 0455  HGB  --   --  8.7*  HCT  --   --  27.7*  PLT  --   --  132*  LABPROT 30.2* 30.3* 28.4*  INR 3.0* 3.0* 2.8*  CREATININE 2.12* 2.26* 2.11*    Estimated Creatinine Clearance: 19.6 mL/min (A) (by C-G formula based on SCr of 2.11 mg/dL (H)).   Medical History: Past Medical History:  Diagnosis Date   Anxiety    Diabetes mellitus with neuropathy (HCC)    Diabetic neuropathy ([place])    DJD (degenerative joint disease)    H/O aortic valve replacement    Hyperlipidemia    Hypertension    Neuromuscular disorder ([place])    neuropathy in feet   Obesity    Sleep apnea    Stroke ([place])    " light stroke"   Wears glasses     Medications:  PTA medications include warfarin 2 mg po qday  Assessment: Pharmacy consulted to dose warfarin for this patient with a history of mechanical AVR and afib.  INR has been therapeutic on this regimen.  Warfarin dose held 8/9 for cardiac cath 8/10.  Cath complete, ok to resume warfarin.  Will give increased dose due to held dose last PM.  Goal of Therapy:  INR 2.5-3.5 Monitor platelets by anticoagulation protocol: Yes   Plan:  Warfarin 3mg  PO x 1 tonight Monitor daily INR for warfarin dose adjustments, CBC, s/s bleeding  [employer], Pharm.D., BCPS Clinical Pharmacist  **Pharmacist phone directory can be found on [email or web address] listed under [place].  10/28/2019 5:16 PM

## 2019-10-28 NOTE — Plan of Care (Signed)

## 2019-10-28 NOTE — NC FL2 (Signed)
[place] MEDICAID FL2 LEVEL OF CARE SCREENING TOOL     IDENTIFICATION  Patient Name: Mary [name] Birthdate: 12/21/1940 Sex: female Admission Date (Current Location): 10/23/2019  [facility] and [place] Number:  Herbalist and Address:  [facility] [place], [place] [address], [place], [place] [address]      Provider Number: [id number]  Attending Physician Name and Address:  Jonnie Finner, DO  Relative Name and Phone Number:       Current Level of Care: Hospital Recommended Level of Care: [place] Prior Approval Number:    Date Approved/Denied:   PASRR Number: [id number]  Discharge Plan: SNF    Current Diagnoses: Patient Active Problem List   Diagnosis Date Noted  . Other secondary pulmonary hypertension ([place]) 10/28/2019  . Acute on chronic combined systolic and diastolic CHF (congestive heart failure) ([place])   . Anasarca 04/25/2019  . Hypoalbuminemia 04/25/2019  . Persistent atrial fibrillation ([place]) 04/25/2019  . CKD (chronic kidney disease), stage III 04/25/2019  . Anemia due to chronic kidney disease 04/25/2019  . Pressure injury of skin 04/24/2019  . Fall 04/23/2019  . S/P right knee arthroscopy 04/13/2015  . OSA (obstructive sleep apnea) 01/01/2013  . Diabetes ([place]) 10/23/2012  . Aortic valve disorder 06/30/2010  . CVA (cerebral vascular accident) ([place]) 06/30/2010  . S/P aortic valve replacement 06/30/2010  . Hyperlipidemia 09/29/2008  . Obesity 09/29/2008  . Essential hypertension 09/29/2008  . DEGENERATIVE JOINT DISEASE 09/29/2008    Orientation RESPIRATION BLADDER Height & Weight     Self, Situation, Place  Normal Incontinent, External catheter Weight: 137 lb 5.6 oz (62.3 kg) Height:  5\' 6"  (167.6 cm)  BEHAVIORAL SYMPTOMS/MOOD NEUROLOGICAL BOWEL NUTRITION STATUS      Continent Diet (see discharge summary)  AMBULATORY STATUS COMMUNICATION OF NEEDS Skin   Extensive Assist Verbally Skin abrasions, PU Stage and  Appropriate Care (generalized ecchymosis; cracking on legs; abrasions on leg) PU Stage 1 Dressing:  (on sacrum with foam)                     Personal Care Assistance Level of Assistance  Bathing, Feeding, Dressing Bathing Assistance: Maximum assistance Feeding assistance: Independent Dressing Assistance: Maximum assistance     Functional Limitations Info  Sight, Hearing, Speech Sight Info: Impaired Hearing Info: Adequate Speech Info: Adequate    SPECIAL CARE FACTORS FREQUENCY  PT (By licensed PT), OT (By licensed OT)     PT Frequency: 5x week OT Frequency: 5x week            Contractures Contractures Info: Not present    Additional Factors Info  Code Status, Allergies, Insulin Sliding Scale Code Status Info: Full Code Allergies Info: Tape   Insulin Sliding Scale Info: insulin aspart (novoLOG) injection 0-5 Units daily at bedtime; nsulin aspart (novoLOG) injection 0-6 Units 3x daily with meals       Current Medications (10/28/2019):  This is the current hospital active medication list Current Facility-Administered Medications  Medication Dose Route Frequency Provider Last Rate Last Admin  . 0.9 %  sodium chloride infusion  250 mL Intravenous PRN Leonie Man, MD      . 0.9 %  sodium chloride infusion  250 mL Intravenous PRN Leonie Man, MD      . acetaminophen (TYLENOL) tablet 500 mg  500 mg Oral BID PRN Leonie Man, MD      . albuterol (PROVENTIL) (2.5 MG/3ML) 0.083% nebulizer solution 2.5 mg  2.5 mg Nebulization Q2H PRN Leonie Man, MD      . ALPRAZolam Duanne Moron) tablet 0.5 mg  0.5 mg Oral QHS PRN Leonie Man, MD   0.5 mg at 10/27/19 2327  . bisacodyl (DULCOLAX) EC tablet 5 mg  5 mg Oral Daily PRN Leonie Man, MD      . fluticasone [facility]) 50 MCG/ACT nasal spray 1 spray  1 spray Each Nare QHS Leonie Man, MD   1 spray at 10/26/19 2211  . furosemide (LASIX) injection 80 mg  80 mg Intravenous Q12H Leonie Man, MD   80 mg at  10/27/19 0540  . HYDROcodone-acetaminophen (NORCO) 10-325 MG per tablet 1 tablet  1 tablet Oral BID Leonie Man, MD   1 tablet at 10/28/19 [phone]  . insulin aspart (novoLOG) injection 0-5 Units  0-5 Units Subcutaneous QHS Leonie Man, MD      . insulin aspart (novoLOG) injection 0-6 Units  0-6 Units Subcutaneous TID WC Leonie Man, MD   1 Units at 10/25/19 1158  . lactulose (CHRONULAC) 10 GM/15ML solution 20 g  20 g Oral Daily Leonie Man, MD   20 g at 10/28/19 0831  . loratadine (CLARITIN) tablet 10 mg  10 mg Oral Daily Leonie Man, MD   10 mg at 10/28/19 2585  . metoprolol succinate (TOPROL-XL) 24 hr tablet 25 mg  25 mg Oral q morning - 10a Leonie Man, MD   25 mg at 10/28/19 2778  . ondansetron (ZOFRAN) tablet 4 mg  4 mg Oral Q6H PRN Leonie Man, MD       Or  . ondansetron [facility]) injection 4 mg  4 mg Intravenous Q6H PRN Leonie Man, MD      . pantoprazole (PROTONIX) EC tablet 40 mg  40 mg Oral QAC breakfast Leonie Man, MD   40 mg at 10/28/19 2423  . potassium chloride (KLOR-CON) CR tablet 10 mEq  10 mEq Oral q morning - 10a Leonie Man, MD   10 mEq at 10/28/19 [phone]  . simvastatin (ZOCOR) tablet 40 mg  40 mg Oral Q2000 Leonie Man, MD   40 mg at 10/27/19 2323  . sodium chloride flush (NS) 0.9 % injection 3 mL  3 mL Intravenous Q12H Leonie Man, MD   3 mL at 10/28/19 1205  . sodium chloride flush (NS) 0.9 % injection 3 mL  3 mL Intravenous PRN Leonie Man, MD      . sodium chloride flush (NS) 0.9 % injection 3 mL  3 mL Intravenous Q12H Leonie Man, MD   3 mL at 10/28/19 1204  . sodium chloride flush (NS) 0.9 % injection 3 mL  3 mL Intravenous PRN Leonie Man, MD      . traZODone (DESYREL) tablet 50 mg  50 mg Oral QHS PRN Leonie Man, MD   50 mg at 10/27/19 2327  . vitamin B-12 (CYANOCOBALAMIN) tablet 500 mcg  500 mcg Oral Daily Leonie Man, MD   500 mcg at 10/28/19 1215  . Vitamin D (Ergocalciferol) (DRISDOL)  capsule 50,000 Units  50,000 Units Oral Q Roselie Skinner, MD   50,000 Units at 10/28/19 4431     Discharge Medications: Please see discharge summary for a list of discharge medications.  Relevant Imaging Results:  Relevant Lab Results:   Additional Information [id number] [place], [place]

## 2019-10-28 NOTE — Progress Notes (Signed)
PROGRESS NOTE    Mary [name]  ZWC:[id number] DOB: Jul 29, 1940 DOA: 10/23/2019 PCP: [name], MD   Brief Narrative:   79 y.o.femalewith past medical history relevant for diastolic dysfunction CHF, HTN, status post aortic valve replacement/mechanical Saint Jude valve, 1995/1996,paroxysmal atrial fibrillation, history of prior stroke, history of DM, CKD 3b, OSA admitted on 10/23/2019 with acute on chronic diastolic dysfunction CHF exacerbation and ascites secondary to liver cirrhosis as well as AKI having failed outpatient attempt at oral diuresis  8/10: To go for [facility]HC today. Appreciate cardiology assistance. Will await final recs.    Assessment & Plan:  Acute on Chronic HFpEF Hx of pulmonary HTN and RV dysfunction     - 04/2019 echo LVEF 60-65%, elevated LA pressure, moderate RV dysfunction, severe RVE, severe biatrial enlargement, mod MR, severe TR, severe pulm HTN     - Repeat echo on 10/23/2019 with EF down to 55 to 60%, with severe pulmonary hypertension PASP of 65     - failed outpt: torsemide 80mg  bid, metolazone 10mg as needed (her SCr was trending up and she was developing ascites)     - continue lasix 80mg  IV BID     - cards onboard; appreciate assistance; going for [place] today; awaiting final recs  S/p aortic valve replacement with Saint Jude AVR in 1995/1996     - coumadin held for RHC; INR 2.8 today     - resume coumadin when cleared by cards  OSA     - history of noncompliance with CPAP  Hx of paroxysmal A. fib and prior CVA     - on Coumadin, Toprol-XL; follow BP/HR  Hx of cirrhosis on prior imaging studies     - doesn't appear to have had work-up for cirrhosis     - abdominal ultrasound consistent with liver cirrhosis with ascites      - ammonia 53; on lactulose, mentation ok; folow     - s/p paracentesis on 10/24/2018 for removal of 4.2 L: fluid studies negative     - Viral hepatitis profile negative, TSH normal, LDL 27     - if SCr improves; can trial  lasix/aldactone  AKI on CKD3b     - SCr on admission was 2.20, baseline creatinine =1.4      - SCr is 2.11 today; watch nephrotoxins, follow  Chronic macrocytic anemia     - baseline Hgb is about 9     - Hgb is 8.7 today; no evidence of bleed, follow     - B12 supplement  DMt2     - SSI, DM diet, CBG  DVT prophylaxis: Coumadin Code Status: FULL Family Communication: With husband at bedside.   Status is: Inpatient  Remains inpatient appropriate because:Inpatient level of care appropriate due to severity of illness   Dispo: The patient is from: Home              Anticipated d/c is to: Home              Anticipated d/c date is: 3 days              Patient currently is not medically stable to d/c.  Consultants:   Cardiology  Procedures:   RHC   ROS:  Denies CP, N, V, ab pain, dyspnea . Remainder ROS is negative for all not previously mentioned.  Subjective: "I'll do what you say."  Objective: Vitals:   10/28/19 1111 10/28/19 1116 10/28/19 1121 10/28/19 1149  BP: (!) 109/59 109/61 124/64  98/68  Pulse: 74 69 69 63  Resp: (!) 21 (!) 22 (!) 23 20  Temp:    98.5 F (36.9 C)  TempSrc:    Oral  SpO2: 100% 100% 100% 96%  Weight:      Height:        Intake/Output Summary (Last 24 hours) at 10/28/2019 1455 Last data filed at 10/28/2019 1300 Gross per 24 hour  Intake 0 ml  Output 450 ml  Net -450 ml   Filed Weights   10/27/19 0511 10/27/19 1018 10/28/19 0301  Weight: 69.4 kg 68.7 kg 62.3 kg    Examination:  General: 79 y.o. female resting in bed in NAD Cardiovascular: irregular, +S1, S2, no g/r, 1/6 SEM Respiratory: decreased at bases, no w/r/r, normal WOB GI: BS+, NDNT, no masses noted, no organomegaly noted MSK: No e/c/c Neuro: Alert to name, follows commands Psyc: calm/cooperative   Data Reviewed: I have personally reviewed following labs and imaging studies.  CBC: Recent Labs  Lab 10/23/19 1103 10/24/19 0748 10/25/19 0607 10/28/19 0455   WBC 4.6 4.4 4.0 4.7  HGB 9.5* 9.1* 9.2* 8.7*  HCT 30.8* 28.7* 29.6* 27.7*  MCV 114.1* 112.5* 113.8* 109.9*  PLT 145* 137* 133* 903*   Basic Metabolic Panel: Recent Labs  Lab 10/23/19 1103 10/23/19 1103 10/24/19 0748 10/25/19 0607 10/26/19 0654 10/27/19 0600 10/28/19 0455  NA 136   < > 136 136 136 135 136  K 3.4*   < > 3.5 3.6 3.2* 3.9 3.5  CL 92*   < > 93* 93* 93* 91* 92*  CO2 30   < > 30 32 31 32 32  GLUCOSE 112*   < > 88 108* 86 85 100*  BUN 61*   < > 61* 56* 52* 53* 44*  CREATININE 2.20*   < > 2.18* 2.18* 2.12* 2.26* 2.11*  CALCIUM 8.7*   < > 8.6* 8.8* 8.7* 8.8* 8.7*  MG 2.5*  --   --   --   --   --   --   PHOS  --   --   --   --  3.8 3.5  --    < > = values in this interval not displayed.   GFR: Estimated Creatinine Clearance: 19.6 mL/min (A) (by C-G formula based on SCr of 2.11 mg/dL (H)). Liver Function Tests: Recent Labs  Lab 10/26/19 0654 10/27/19 0600  ALBUMIN 2.4* 2.5*   No results for input(s): LIPASE, AMYLASE in the last 168 hours. Recent Labs  Lab 10/23/19 1238  AMMONIA 53*   Coagulation Profile: Recent Labs  Lab 10/24/19 0748 10/25/19 0607 10/26/19 0654 10/27/19 0600 10/28/19 0455  INR 3.1* 3.3* 3.0* 3.0* 2.8*   Cardiac Enzymes: No results for input(s): CKTOTAL, CKMB, CKMBINDEX, TROPONINI in the last 168 hours. BNP (last 3 results) No results for input(s): PROBNP in the last 8760 hours. HbA1C: No results for input(s): HGBA1C in the last 72 hours. CBG: Recent Labs  Lab 10/27/19 1148 10/27/19 1619 10/27/19 2337 10/28/19 0557 10/28/19 1158  GLUCAP 122* 137* 148* 82 91   Lipid Profile: No results for input(s): CHOL, HDL, LDLCALC, TRIG, CHOLHDL, LDLDIRECT in the last 72 hours. Thyroid Function Tests: No results for input(s): TSH, T4TOTAL, FREET4, T3FREE, THYROIDAB in the last 72 hours. Anemia Panel: No results for input(s): VITAMINB12, FOLATE, FERRITIN, TIBC, IRON, RETICCTPCT in the last 72 hours. Sepsis Labs: No results for  input(s): PROCALCITON, LATICACIDVEN in the last 168 hours.  Recent Results (from the past 240 hour(s))  SARS Coronavirus 2 by RT PCR (hospital order, performed in [facility] hospital lab) Nasopharyngeal Nasopharyngeal Swab     Status: None   Collection Time: 10/23/19 11:52 AM   Specimen: Nasopharyngeal Swab  Result Value Ref Range Status   SARS Coronavirus 2 NEGATIVE NEGATIVE Final    Comment: (NOTE) SARS-CoV-2 target nucleic acids are NOT DETECTED.  The SARS-CoV-2 RNA is generally detectable in upper and lower respiratory specimens during the acute phase of infection. The lowest concentration of SARS-CoV-2 viral copies this assay can detect is 250 copies / mL. A negative result does not preclude SARS-CoV-2 infection and should not be used as the sole basis for treatment or other patient management decisions.  A negative result may occur with improper specimen collection / handling, submission of specimen other than nasopharyngeal swab, presence of viral mutation(s) within the areas targeted by this assay, and inadequate number of viral copies (<250 copies / mL). A negative result must be combined with clinical observations, patient history, and epidemiological information.  Fact Sheet for Patients:   StrictlyIdeas.no  Fact Sheet for Healthcare Providers: BankingDealers.co.za  This test is not yet approved or  cleared by the [place] FDA and has been authorized for detection and/or diagnosis of SARS-CoV-2 by FDA under an Emergency Use Authorization (EUA).  This EUA will remain in effect (meaning this test can be used) for the duration of the COVID-19 declaration under Section 564(b)(1) of the Act, 21 U.S.C. section 360bbb-3(b)(1), unless the authorization is terminated or revoked sooner.  Performed at [facility], [address], [place], [place] [address]   Gram stain     Status: None   Collection Time: 10/24/19  2:22 PM    Specimen: Ascitic; Body Fluid  Result Value Ref Range Status   Specimen Description ASCITIC  Final   Special Requests NONE  Final   Gram Stain   Final    CYTOSPIN SMEAR NO ORGANISMS SEEN WBC PRESENT, PREDOMINANTLY MONONUCLEAR Performed at [facility], [address] [place], [place] [address]    Report Status 10/24/2019 FINAL  Final  Culture, body fluid-bottle     Status: None (Preliminary result)   Collection Time: 10/24/19  2:25 PM   Specimen: Ascitic  Result Value Ref Range Status   Specimen Description ASCITIC  Final   Special Requests 10CC  Final   Culture   Final    NO GROWTH 4 DAYS Performed at [facility], [address], [place], [place] [address]    Report Status PENDING  Incomplete      Radiology Studies: CARDIAC CATHETERIZATION  Result Date: 10/28/2019  Hemodynamic findings consistent with mild pulmonary hypertension.  Large V wave on PCWP waveform-suggestive of mitral valve disease.  Mild Pulmonary Hypertension - (TPG 11 mmHg) The waveform on PCWP suggest much disease, not seen on echo. [name], MD   US Abdomen Limited  Result Date: 10/28/2019 CLINICAL DATA:  Evaluate for ascites EXAM: LIMITED ABDOMEN ULTRASOUND FOR ASCITES TECHNIQUE: Limited ultrasound survey for ascites was performed in all four abdominal quadrants. COMPARISON:  10/24/2019 FINDINGS: Changes of cirrhosis of the liver are noted. Mild to moderate ascites is seen similar to that noted on the prior exam. IMPRESSION: Recurrent ascites and changes of hepatic cirrhosis. Electronically Signed   By: Inez Catalina M.D.   On: 10/28/2019 14:41     Scheduled Meds: . fluticasone  1 spray Each Nare QHS  . furosemide  80 mg Intravenous Q12H  . HYDROcodone-acetaminophen  1 tablet Oral BID  .  insulin aspart  0-5 Units Subcutaneous QHS  . insulin aspart  0-6 Units Subcutaneous TID WC  . lactulose  20 g Oral Daily  . loratadine  10 mg Oral Daily  . metoprolol succinate  25 mg Oral q morning - 10a  .  pantoprazole  40 mg Oral QAC breakfast  . potassium chloride  10 mEq Oral q morning - 10a  . simvastatin  40 mg Oral Q2000  . sodium chloride flush  3 mL Intravenous Q12H  . sodium chloride flush  3 mL Intravenous Q12H  . vitamin B-12  500 mcg Oral Daily  . Vitamin D (Ergocalciferol)  50,000 Units Oral Q Sun   Continuous Infusions: . sodium chloride    . sodium chloride       LOS: 5 days    Time spent: 25 minutes spent in the coordination of care today.    Jonnie Finner, DO [facility]  If 7PM-7AM, please contact night-coverage [email or web address] 10/28/2019, 2:55 PM

## 2019-10-28 NOTE — Progress Notes (Signed)
Pt refusing cpap for the night. ?

## 2019-10-28 NOTE — Interval H&P Note (Signed)
History and Physical Interval Note:  10/28/2019 10:32 AM  Mary [name]  has presented today for surgery, with the diagnosis of heart failure.  The various methods of treatment have been discussed with the patient and family. After consideration of risks, benefits and other options for treatment, the patient has consented to  Procedure(s): RIGHT HEART CATH (N/A) as a surgical intervention.  The patient's history has been reviewed, patient examined, no change in status, stable for surgery.  I have reviewed the patient's chart and labs.  Questions were answered to the patient's satisfaction.     [name]

## 2019-10-28 NOTE — H&P (View-Only) (Signed)
Progress Note  Patient Name: Mary [name] Date of Encounter: 10/28/2019  Primary Cardiologist: Carlyle Dolly, MD   Subjective   Breathing ok, husband present. They have questions about the procedure. Also wonder if she needs another paracentesis.   Inpatient Medications    Scheduled Meds: . fluticasone  1 spray Each Nare QHS  . furosemide  80 mg Intravenous Q12H  . HYDROcodone-acetaminophen  1 tablet Oral BID  . insulin aspart  0-5 Units Subcutaneous QHS  . insulin aspart  0-6 Units Subcutaneous TID WC  . lactulose  20 g Oral Daily  . loratadine  10 mg Oral Daily  . metoprolol succinate  25 mg Oral q morning - 10a  . pantoprazole  40 mg Oral QAC breakfast  . potassium chloride  10 mEq Oral q morning - 10a  . simvastatin  40 mg Oral Q2000  . sodium chloride flush  3 mL Intravenous Q12H  . sodium chloride flush  3 mL Intravenous Q12H  . vitamin B-12  500 mcg Oral Daily  . Vitamin D (Ergocalciferol)  50,000 Units Oral Q Sun   Continuous Infusions: . sodium chloride    . sodium chloride    . sodium chloride     PRN Meds: sodium chloride, sodium chloride, acetaminophen, albuterol, ALPRAZolam, bisacodyl, ondansetron **OR** ondansetron (ZOFRAN) IV, sodium chloride flush, sodium chloride flush, traZODone   Vital Signs    Vitals:   10/28/19 0114 10/28/19 0301 10/28/19 0403 10/28/19 0749  BP: (!) 94/39  (!) 104/56 (!) 107/53  Pulse: 76  80 72  Resp: 17  16 16   Temp: 98.4 F (36.9 C)  98.3 F (36.8 C) 98.2 F (36.8 C)  TempSrc: Oral  Oral Oral  SpO2: 97%  95% 92%  Weight:  62.3 kg    Height:        Intake/Output Summary (Last 24 hours) at 10/28/2019 0755 Last data filed at 10/28/2019 0408 Gross per 24 hour  Intake 243 ml  Output 250 ml  Net -7 ml    Last 3 Weights 10/28/2019 10/27/2019 10/27/2019  Weight (lbs) 137 lb 5.6 oz 151 lb 7.3 oz 153 lb  Weight (kg) 62.3 kg 68.7 kg 69.4 kg      Telemetry    Afib, rate controlled - Personally Reviewed  ECG    No new   .  - Personally Reviewed  Physical Exam   General: frail, elderly, female in no acute distress Head: Eyes PERRLA, Head normocephalic and atraumatic Lungs: rales bases bilaterally to auscultation. Heart: slightly irregular R&R, S1 S2, without rub or gallop. 2/6 murmur, crisp valve click. 4/4 extremity pulses are 2+ & equal. JVD to jaw Abdomen: Bowel sounds are present, abdomen soft and non-tender without masses or  hernias noted. Msk: weak strength and tone for age. Extremities: No clubbing, cyanosis or edema.    Skin:  No rashes or lesions noted. Neuro: Alert and oriented X 3. Psych:  Good affect, responds appropriately, gets anxious easily, anxious about the procedure  Labs    Chemistry Recent Labs  Lab 10/26/19 0654 10/27/19 0600 10/28/19 0455  NA 136 135 136  K 3.2* 3.9 3.5  CL 93* 91* 92*  CO2 31 32 32  GLUCOSE 86 85 100*  BUN 52* 53* 44*  CREATININE 2.12* 2.26* 2.11*  CALCIUM 8.7* 8.8* 8.7*  ALBUMIN 2.4* 2.5*  --   GFRNONAA 21* 20* 21*  GFRAA 25* 23* 25*  ANIONGAP 12 12 12     Lab Results  Component  Value Date   ALT 9 10/20/2019   AST 24 10/20/2019   ALKPHOS 67 10/20/2019   BILITOT 1.5 (H) 10/20/2019   Hematology Recent Labs  Lab 10/24/19 0748 10/25/19 0607 10/28/19 0455  WBC 4.4 4.0 4.7  RBC 2.55* 2.60* 2.52*  HGB 9.1* 9.2* 8.7*  HCT 28.7* 29.6* 27.7*  MCV 112.5* 113.8* 109.9*  MCH 35.7* 35.4* 34.5*  MCHC 31.7 31.1 31.4  RDW 16.5* 16.7* 16.5*  PLT 137* 133* 132*    Cardiac Enzymes High Sensitivity Troponin:  No results for input(s): TROPONINIHS in the last 720 hours.     BNP Recent Labs  Lab 10/23/19 1103  BNP 565.0*    Lab Results  Component Value Date   INR 2.8 (H) 10/28/2019   INR 3.0 (H) 10/27/2019   INR 3.0 (H) 10/26/2019   Ammonia  Date Value Ref Range Status  10/23/2019 53 (H) 9 - 35 umol/L Final    Comment:    Performed at [facility], [address] [place], [place] [address]    Radiology    US Abdomen  Complete  Result Date: 10/24/2019 CLINICAL DATA:  Abdominal distension EXAM: ABDOMEN ULTRASOUND COMPLETE COMPARISON:  03/27/2016 FINDINGS: Gallbladder: Gallbladder is well distended without gallbladder wall thickening. Cholelithiasis and gallbladder sludge is seen. Common bile duct: Diameter: 3.1 mm. Liver: Liver is shrunken with a nodular appearance. No focal mass lesion is noted. Stable cyst is noted in the left lobe of the liver measuring 1.8 cm in greatest dimension. Portal vein is patent on color Doppler imaging with normal direction of blood flow towards the liver. IVC: No abnormality visualized. Pancreas: Visualized portion unremarkable. Spleen: Size and appearance within normal limits. Right Kidney: Length: 7.9 cm. Slight increased echogenicity is noted. Scarring is also noted in the upper pole of the right kidney. No obstructive changes are seen. Left Kidney: Length: 9.4 cm. Echogenicity within normal limits. No mass or hydronephrosis visualized. Abdominal aorta: No aneurysm visualized. Other findings: Ascites is noted. Additionally a right-sided pleural effusion is noted. IMPRESSION: Changes consistent with cirrhosis of the liver with associated ascites. Right-sided pleural effusion. Left hepatic cyst stable from the prior study. Cholelithiasis and gallbladder sludge without complicating factors. Electronically Signed   By: Inez Catalina M.D.   On: 10/24/2019 09:02   DG Chest Portable 1 View  Result Date: 10/23/2019 CLINICAL DATA:  Evaluate for pulmonary edema EXAM: PORTABLE CHEST 1 VIEW COMPARISON:  10/14/2019 FINDINGS: Cardiomegaly. Prior median sternotomy. Moderate right pleural effusion with right lower lobe atelectasis. Areas of scarring in the lungs bilaterally. No overt edema. No acute bony abnormality. IMPRESSION: Stable moderate right effusion with right base atelectasis. Cardiomegaly. Chronic changes.  No overt edema. Electronically Signed   By: Rolm Baptise M.D.   On: 10/23/2019 11:00    ECHOCARDIOGRAM COMPLETE  Result Date: 10/23/2019    ECHOCARDIOGRAM REPORT   Patient Name:   Mary [name] Date of Exam: 10/23/2019 Medical Rec #:  [id number]   Height:       66.0 in Accession #:    [id number]  Weight:       160.2 lb Date of Birth:  01-06-1941   BSA:          1.820 m Patient Age:    81 years    BP:           119/65 mmHg Patient Gender: F           HR:           71  bpm. Exam Location:  [facility] Procedure: 2D Echo Indications:    Dyspnea 786.09 / R06.00  History:        Patient has prior history of Echocardiogram examinations, most                 recent 04/24/2019. Stroke, Arrythmias:Atrial Fibrillation; Risk                 Factors:Non-Smoker, Dyslipidemia, Diabetes and Hypertension.                 CKD.  Sonographer:    Leavy Cella RDCS (AE) Referring Phys: [id number] [place]  1. Left ventricular ejection fraction, by estimation, is 55 to 60%. The left ventricle has normal function. The left ventricle has no regional wall motion abnormalities. Left ventricular diastolic parameters are indeterminate.  2. Right ventricular systolic function is severely reduced. The right ventricular size is severely enlarged. There is moderately elevated pulmonary artery systolic pressure.  3. Left atrial size was moderately dilated.  4. Right atrial size was severely dilated.  5. The mitral valve is normal in structure. Mild mitral valve regurgitation. No evidence of mitral stenosis.  6. The tricuspid valve is abnormal. Tricuspid valve regurgitation is severe.  7. There is a mechanical valve in the AV position. From medical record St Jude valve, unknown type and size. . The aortic valve has been repaired/replaced. Aortic valve regurgitation is not visualized. No aortic stenosis is present.  8. Severe pulmonary HTN, PASP is 65 mmHg.  9. The inferior vena cava is dilated in size with <50% respiratory variability, suggesting right atrial pressure of 15 mmHg. FINDINGS  Left Ventricle: Left  ventricular ejection fraction, by estimation, is 55 to 60%. The left ventricle has normal function. The left ventricle has no regional wall motion abnormalities. The left ventricular internal cavity size was small. There is no left ventricular hypertrophy. Left ventricular diastolic parameters are indeterminate. Right Ventricle: The right ventricular size is severely enlarged. No increase in right ventricular wall thickness. Right ventricular systolic function is severely reduced. There is moderately elevated pulmonary artery systolic pressure. The tricuspid regurgitant velocity is 3.46 m/s, and with an assumed right atrial pressure of 10 mmHg, the estimated right ventricular systolic pressure is 01.6 mmHg. Left Atrium: Left atrial size was moderately dilated. Right Atrium: Right atrial size was severely dilated. Pericardium: There is no evidence of pericardial effusion. Mitral Valve: The mitral valve is normal in structure. There is mild thickening of the mitral valve leaflet(s). There is mild calcification of the mitral valve leaflet(s). Mild mitral annular calcification. Mild mitral valve regurgitation. No evidence of  mitral valve stenosis. Tricuspid Valve: The tricuspid valve is abnormal. Tricuspid valve regurgitation is severe. No evidence of tricuspid stenosis. Aortic Valve: There is a mechanical valve in the AV position. From medical record St Jude valve, unknown type and size. The aortic valve has been repaired/replaced. Aortic valve regurgitation is not visualized. No aortic stenosis is present. Aortic valve  mean gradient measures 3.8 mmHg. Aortic valve peak gradient measures 6.4 mmHg. Pulmonic Valve: The pulmonic valve was not well visualized. Pulmonic valve regurgitation is mild to moderate. No evidence of pulmonic stenosis. Aorta: The aortic root is normal in size and structure. Pulmonary Artery: Severe pulmonary HTN, PASP is 65 mmHg. Venous: The inferior vena cava is dilated in size with less than 50%  respiratory variability, suggesting right atrial pressure of 15 mmHg. IAS/Shunts: The interatrial septum was not well visualized.  LEFT VENTRICLE PLAX  2D LVIDd:         4.01 cm Diastology LVIDs:         3.50 cm LV e' lateral:   7.97 cm/s LV PW:         1.13 cm LV E/e' lateral: 14.1 LV IVS:        0.70 cm LV e' medial:    7.31 cm/s                        LV E/e' medial:  15.3  RIGHT VENTRICLE RV S prime:     4.05 cm/s TAPSE (M-mode): 0.8 cm LEFT ATRIUM             Index       RIGHT ATRIUM           Index LA diam:        3.60 cm 1.98 cm/m  RA Area:     18.80 cm LA Vol (A2C):   69.0 ml 37.92 ml/m RA Volume:   54.60 ml  30.00 ml/m LA Vol (A4C):   59.8 ml 32.86 ml/m LA Biplane Vol: 64.9 ml 35.66 ml/m  AORTIC VALVE AV Vmax:           126.67 cm/s AV Vmean:          93.430 cm/s AV VTI:            0.268 m AV Peak Grad:      6.4 mmHg AV Mean Grad:      3.8 mmHg LVOT Vmax:         64.97 cm/s LVOT Vmean:        44.995 cm/s LVOT VTI:          0.146 m LVOT/AV VTI ratio: 0.55  AORTA Ao Root diam: 2.30 cm MITRAL VALVE                TRICUSPID VALVE MV Area (PHT): 3.33 cm     TR Peak grad:   47.9 mmHg MV Decel Time: 228 msec     TR Vmax:        346.00 cm/s MV E velocity: 112.00 cm/s                             SHUNTS                             Systemic VTI: 0.15 m Carlyle Dolly MD Electronically signed by Carlyle Dolly MD Signature Date/Time: 10/23/2019/3:56:43 PM    Final     Cardiac Studies   Echocardiogram: 10/23/2019 IMPRESSIONS    1. Left ventricular ejection fraction, by estimation, is 55 to 60%. The  left ventricle has normal function. The left ventricle has no regional  wall motion abnormalities. Left ventricular diastolic parameters are  indeterminate.  2. Right ventricular systolic function is severely reduced. The right  ventricular size is severely enlarged. There is moderately elevated  pulmonary artery systolic pressure.  3. Left atrial size was moderately dilated.  4. Right atrial size was  severely dilated.  5. The mitral valve is normal in structure. Mild mitral valve  regurgitation. No evidence of mitral stenosis.  6. The tricuspid valve is abnormal. Tricuspid valve regurgitation is  severe.  7. There is a mechanical valve in the AV position. From medical record St  Jude valve, unknown type and size. . The aortic valve  has been  repaired/replaced. Aortic valve regurgitation is not visualized. No aortic  stenosis is present.  8. Severe pulmonary HTN, PASP is 65 mmHg.  9. The inferior vena cava is dilated in size with <50% respiratory  variability, suggesting right atrial pressure of 15 mmHg.   Patient Profile     79 y.o. female w/ PMH of chronic diastolic CHF, persistent atrial fibrillation, mechanical AVR in 1995, HTN, HLD, and OSA who was sent to the Emergency Department from the office on 10/23/2019 for an acute CHF exacerbation and further evaluation of her cirrhosis.   Assessment & Plan    1. Acute on Chronic Diastolic CHF/ R sided CHF  - ?anasarca or cor pulmonale, w/ RV dysfunction and pulm HTN on echo (nl 2017) - for R heart cath today, all questions answered to their satisfaction - s/p paracentesis 08/06 w/ 4.2 L off>>recheck abd [place] - R heart cath will greatly help guide treatment, pt and husband agree to the procedure  2. Persistent Atrial Fibrillation - controlled rate  3. Aortic Valve Replacement - mech AVR 1995, functioning well on echo  - coumadin therapeutic  4. Pulmonary HTN - PASP 65 mmHg by echo 08/05 - non-compliant w/ CPAP as outpt, ?O2 qhs - R heart cath today  5. Cirrhosis - cirrhosis and ascites seen - LFTs ok except alb 2.5, T bili 1.5 - ammonia 53 on 08/05 - on Lactulose - per IM  6. AKI - Cr 1.42 on 05/13/2019, was 2.18 on 07/12 and was 2.37 on 08/02 - R heart cath will help guide therapy  Pt's husband has power of attorney   He agrrees to proceeding with procedure to help guide Rx. Otherwise, per IM Principal Problem:    Acute exacerbation of dCHF (congestive heart failure) (HCC) Active Problems:   Essential hypertension   CVA (cerebral vascular accident) ([place])   S/P aortic valve replacement   Diabetes (HCC)   OSA (obstructive sleep apnea)   CKD (chronic kidney disease), stage III   Acute combined systolic and diastolic congestive heart failure ([place])    For questions or updates, please contact [facility] Please consult [email or web address] for contact info under Cardiology/STEMI.   Jonetta Speak , PA-C 7:55 AM 10/28/2019 Pager: [phone]

## 2019-10-29 DIAGNOSIS — I5043 Acute on chronic combined systolic (congestive) and diastolic (congestive) heart failure: Secondary | ICD-10-CM

## 2019-10-29 DIAGNOSIS — R188 Other ascites: Secondary | ICD-10-CM

## 2019-10-29 DIAGNOSIS — I50811 Acute right heart failure: Secondary | ICD-10-CM

## 2019-10-29 DIAGNOSIS — R140 Abdominal distension (gaseous): Secondary | ICD-10-CM

## 2019-10-29 DIAGNOSIS — K7460 Unspecified cirrhosis of liver: Secondary | ICD-10-CM

## 2019-10-29 LAB — POCT I-STAT EG7
Acid-Base Excess: 12 mmol/L — ABNORMAL HIGH (ref 0.0–2.0)
Acid-Base Excess: 13 mmol/L — ABNORMAL HIGH (ref 0.0–2.0)
Bicarbonate: 37 mmol/L — ABNORMAL HIGH (ref 20.0–28.0)
Bicarbonate: 37.3 mmol/L — ABNORMAL HIGH (ref 20.0–28.0)
Calcium, Ion: 1.01 mmol/L — ABNORMAL LOW (ref 1.15–1.40)
Calcium, Ion: 1.12 mmol/L — ABNORMAL LOW (ref 1.15–1.40)
HCT: 27 % — ABNORMAL LOW (ref 36.0–46.0)
HCT: 29 % — ABNORMAL LOW (ref 36.0–46.0)
Hemoglobin: 9.2 g/dL — ABNORMAL LOW (ref 12.0–15.0)
Hemoglobin: 9.9 g/dL — ABNORMAL LOW (ref 12.0–15.0)
O2 Saturation: 74 %
O2 Saturation: 75 %
Potassium: 3.5 mmol/L (ref 3.5–5.1)
Potassium: 3.6 mmol/L (ref 3.5–5.1)
Sodium: 139 mmol/L (ref 135–145)
Sodium: 141 mmol/L (ref 135–145)
TCO2: 38 mmol/L — ABNORMAL HIGH (ref 22–32)
TCO2: 39 mmol/L — ABNORMAL HIGH (ref 22–32)
pCO2, Ven: 47.2 mmHg (ref 44.0–60.0)
pCO2, Ven: 47.6 mmHg (ref 44.0–60.0)
pH, Ven: 7.498 — ABNORMAL HIGH (ref 7.250–7.430)
pH, Ven: 7.506 — ABNORMAL HIGH (ref 7.250–7.430)
pO2, Ven: 36 mmHg (ref 32.0–45.0)
pO2, Ven: 37 mmHg (ref 32.0–45.0)

## 2019-10-29 LAB — CULTURE, BODY FLUID W GRAM STAIN -BOTTLE: Culture: NO GROWTH

## 2019-10-29 LAB — CBC WITH DIFFERENTIAL/PLATELET
Abs Immature Granulocytes: 0.02 10*3/uL (ref 0.00–0.07)
Basophils Absolute: 0 10*3/uL (ref 0.0–0.1)
Basophils Relative: 1 %
Eosinophils Absolute: 0.7 10*3/uL — ABNORMAL HIGH (ref 0.0–0.5)
Eosinophils Relative: 16 %
HCT: 28.2 % — ABNORMAL LOW (ref 36.0–46.0)
Hemoglobin: 8.9 g/dL — ABNORMAL LOW (ref 12.0–15.0)
Immature Granulocytes: 1 %
Lymphocytes Relative: 15 %
Lymphs Abs: 0.7 10*3/uL (ref 0.7–4.0)
MCH: 35 pg — ABNORMAL HIGH (ref 26.0–34.0)
MCHC: 31.6 g/dL (ref 30.0–36.0)
MCV: 111 fL — ABNORMAL HIGH (ref 80.0–100.0)
Monocytes Absolute: 0.5 10*3/uL (ref 0.1–1.0)
Monocytes Relative: 10 %
Neutro Abs: 2.5 10*3/uL (ref 1.7–7.7)
Neutrophils Relative %: 57 %
Platelets: 130 10*3/uL — ABNORMAL LOW (ref 150–400)
RBC: 2.54 MIL/uL — ABNORMAL LOW (ref 3.87–5.11)
RDW: 16.5 % — ABNORMAL HIGH (ref 11.5–15.5)
WBC: 4.4 10*3/uL (ref 4.0–10.5)
nRBC: 0 % (ref 0.0–0.2)

## 2019-10-29 LAB — GLUCOSE, CAPILLARY
Glucose-Capillary: 94 mg/dL (ref 70–99)
Glucose-Capillary: 115 mg/dL — ABNORMAL HIGH (ref 70–99)
Glucose-Capillary: 106 mg/dL — ABNORMAL HIGH (ref 70–99)
Glucose-Capillary: 150 mg/dL — ABNORMAL HIGH (ref 70–99)

## 2019-10-29 LAB — RENAL FUNCTION PANEL
Albumin: 2 g/dL — ABNORMAL LOW (ref 3.5–5.0)
Anion gap: 11 (ref 5–15)
BUN: 43 mg/dL — ABNORMAL HIGH (ref 8–23)
CO2: 32 mmol/L (ref 22–32)
Calcium: 8.6 mg/dL — ABNORMAL LOW (ref 8.9–10.3)
Chloride: 92 mmol/L — ABNORMAL LOW (ref 98–111)
Creatinine, Ser: 2.14 mg/dL — ABNORMAL HIGH (ref 0.44–1.00)
GFR calc Af Amer: 24 mL/min — ABNORMAL LOW (ref >60)
GFR calc non Af Amer: 21 mL/min — ABNORMAL LOW (ref >60)
Glucose, Bld: 90 mg/dL (ref 70–99)
Phosphorus: 3.5 mg/dL (ref 2.5–4.6)
Potassium: 3.6 mmol/L (ref 3.5–5.1)
Sodium: 135 mmol/L (ref 135–145)

## 2019-10-29 LAB — PROTIME-INR
INR: 2.6 — ABNORMAL HIGH (ref 0.8–1.2)
Prothrombin Time: 26.8 seconds — ABNORMAL HIGH (ref 11.4–15.2)

## 2019-10-29 LAB — MAGNESIUM: Magnesium: 2.5 mg/dL — ABNORMAL HIGH (ref 1.7–2.4)

## 2019-10-29 MED ORDER — SPIRONOLACTONE 25 MG PO TABS
50.0000 mg | ORAL_TABLET | Freq: Every day | ORAL | Status: DC
  Administered 2019-10-29 – 2019-11-01 (×4): 50 mg via ORAL
  Filled 2019-10-29 (×4): qty 2

## 2019-10-29 MED ORDER — WARFARIN SODIUM 2 MG PO TABS
2.0000 mg | ORAL_TABLET | Freq: Once | ORAL | Status: AC
  Administered 2019-10-29: 2 mg via ORAL
  Filled 2019-10-29: qty 1

## 2019-10-29 NOTE — Progress Notes (Signed)
PROGRESS NOTE    Mary [name]  VQQ:[id number] DOB: December 04, 1940 DOA: 10/23/2019 PCP: [name], MD    Brief Narrative:  79 y.o.femalewith past medical history relevant for diastolic dysfunction CHF, HTN, status post aortic valve replacement/mechanical Saint Jude valve, 1995/1996,paroxysmal atrial fibrillation, history of prior stroke, history of DM, CKD 3b, OSA admitted on 10/23/2019 with acute on chronic diastolic dysfunction CHF exacerbation and ascites secondary to liver cirrhosis as well as AKI having failed outpatient attempt at oral diuresis  Assessment & Plan:   Principal Problem:   Acute on chronic combined systolic and diastolic CHF (congestive heart failure) (HCC) Active Problems:   Essential hypertension   CVA (cerebral vascular accident) ([place])   S/P aortic valve replacement   Diabetes (HCC)   OSA (obstructive sleep apnea)   CKD (chronic kidney disease), stage III   Other secondary pulmonary hypertension (HCC)   Acute on Chronic HFpEF Hx of pulmonary HTN and RV dysfunction     - 04/2019 echo LVEF 60-65%, elevated LA pressure, moderate RV dysfunction, severe RVE, severe biatrial enlargement, mod MR, severe TR, severe pulm HTN     - Repeat echo on 10/23/2019 with EF down to 55 to 60%, with severe pulmonary hypertension PASP of 65     - failed outpt: torsemide 80mg  bid, metolazone 10mg as needed (her SCr was trending up and she was developing ascites)     - continue lasix 80mg  IV BID     - cardiology following, s/p RHC 8/10  S/p aortic valve replacement with Saint Jude AVR in 1995/1996     - coumadin held for RHC; INR 2.6 today     - continue coumadin dosing per pharmacy  OSA     - history of noncompliance with CPAP -Seems stable  Hx of paroxysmal A. fib and prior CVA     - on Coumadin, Toprol-XL; follow BP/HR -Stable at present  Hx of cirrhosis on prior imaging studies     - doesn't appear to have had work-up for cirrhosis     - abdominal ultrasound  consistent with liver cirrhosis with ascites      - ammonia 53; on lactulose, mentation ok; folow     - s/p paracentesis on 10/24/2018 for removal of 4.2 L: fluid studies negative     - Viral hepatitis profile negative, TSH normal, LDL 27     - recheck LFT's in AM  AKI on CKD3b     - SCr on admission was 2.20, baseline creatinine =1.4      - SCr is 2.11 today; watch nephrotoxins, follow -repeat bmet in AM  Chronic macrocytic anemia     - baseline Hgb is about 9     - hgb trends reviewed, stable thus far, currently 8.9     - B12 supplement  DMt2     - SSI, DM diet, CBG -glycemic trends reviewed. Stable   DVT prophylaxis: Warfarin Code Status: Full Family Communication: Pt in room, family currently at bedside  Status is: Inpatient  Remains inpatient appropriate because:Unsafe d/c plan, IV treatments appropriate due to intensity of illness or inability to take PO and Inpatient level of care appropriate due to severity of illness   Dispo: The patient is from: Home              Anticipated d/c is to: Home              Anticipated d/c date is: 3 days  Patient currently is not medically stable to d/c.       Consultants:   Cardiology  Procedures:   RHC  Antimicrobials: Anti-infectives (From admission, onward)   None       Subjective: Without complaints this AM  Objective: Vitals:   10/29/19 0419 10/29/19 0914 10/29/19 0918 10/29/19 1122  BP: (!) 107/55 (!) 93/46 (!) 101/45 (!) 103/54  Pulse: 93 76 79 73  Resp: 18  18 16   Temp: 98.2 F (36.8 C)   98 F (36.7 C)  TempSrc: Oral   Oral  SpO2: 98% 96% 96% 98%  Weight:      Height:        Intake/Output Summary (Last 24 hours) at 10/29/2019 1645 Last data filed at 10/29/2019 1629 Gross per 24 hour  Intake 1060 ml  Output 1850 ml  Net -790 ml   Filed Weights   10/27/19 1018 10/28/19 0301 10/29/19 0031  Weight: 68.7 kg 62.3 kg 61.4 kg    Examination:  General exam: Appears calm and  comfortable  Respiratory system: Clear to auscultation. Respiratory effort normal. Cardiovascular system: S1 & S2 heard, regular Gastrointestinal system: Abdomen is nondistended, soft and nontender. No organomegaly or masses felt. Normal bowel sounds heard. Central nervous system: Alert and oriented. No focal neurological deficits. Extremities: Symmetric 5 x 5 power. Skin: No rashes, lesions  Psychiatry: Judgement and insight appear normal. Mood & affect appropriate.   Data Reviewed: I have personally reviewed following labs and imaging studies  CBC: Recent Labs  Lab 10/23/19 1103 10/23/19 1103 10/24/19 0748 10/24/19 0748 10/25/19 0607 10/28/19 0455 10/28/19 1114 10/28/19 1115 10/29/19 0653  WBC 4.6  --  4.4  --  4.0 4.7  --   --  4.4  NEUTROABS  --   --   --   --   --   --   --   --  2.5  HGB 9.5*   < > 9.1*   < > 9.2* 8.7* 9.9* 9.2* 8.9*  HCT 30.8*   < > 28.7*   < > 29.6* 27.7* 29.0* 27.0* 28.2*  MCV 114.1*  --  112.5*  --  113.8* 109.9*  --   --  111.0*  PLT 145*  --  137*  --  133* 132*  --   --  130*   < > = values in this interval not displayed.   Basic Metabolic Panel: Recent Labs  Lab 10/23/19 1103 10/24/19 0748 10/25/19 8299 10/25/19 3716 10/26/19 0654 10/26/19 0654 10/27/19 0600 10/28/19 0455 10/28/19 1114 10/28/19 1115 10/29/19 0653  NA 136   < > 136   < > 136   < > 135 136 139 141 135  K 3.4*   < > 3.6   < > 3.2*   < > 3.9 3.5 3.6 3.5 3.6  CL 92*   < > 93*  --  93*  --  91* 92*  --   --  92*  CO2 30   < > 32  --  31  --  32 32  --   --  32  GLUCOSE 112*   < > 108*  --  86  --  85 100*  --   --  90  BUN 61*   < > 56*  --  52*  --  53* 44*  --   --  43*  CREATININE 2.20*   < > 2.18*  --  2.12*  --  2.26* 2.11*  --   --  2.14*  CALCIUM 8.7*   < > 8.8*  --  8.7*  --  8.8* 8.7*  --   --  8.6*  MG 2.5*  --   --   --   --   --   --   --   --   --  2.5*  PHOS  --   --   --   --  3.8  --  3.5  --   --   --  3.5   < > = values in this interval not displayed.    GFR: Estimated Creatinine Clearance: 19.3 mL/min (A) (by C-G formula based on SCr of 2.14 mg/dL (H)). Liver Function Tests: Recent Labs  Lab 10/26/19 0654 10/27/19 0600 10/29/19 0653  ALBUMIN 2.4* 2.5* 2.0*   No results for input(s): LIPASE, AMYLASE in the last 168 hours. Recent Labs  Lab 10/23/19 1238  AMMONIA 53*   Coagulation Profile: Recent Labs  Lab 10/25/19 0607 10/26/19 0654 10/27/19 0600 10/28/19 0455 10/29/19 0653  INR 3.3* 3.0* 3.0* 2.8* 2.6*   Cardiac Enzymes: No results for input(s): CKTOTAL, CKMB, CKMBINDEX, TROPONINI in the last 168 hours. BNP (last 3 results) No results for input(s): PROBNP in the last 8760 hours. HbA1C: No results for input(s): HGBA1C in the last 72 hours. CBG: Recent Labs  Lab 10/28/19 1603 10/28/19 2116 10/29/19 0600 10/29/19 1124 10/29/19 1558  GLUCAP 167* 84 94 115* 106*   Lipid Profile: No results for input(s): CHOL, HDL, LDLCALC, TRIG, CHOLHDL, LDLDIRECT in the last 72 hours. Thyroid Function Tests: No results for input(s): TSH, T4TOTAL, FREET4, T3FREE, THYROIDAB in the last 72 hours. Anemia Panel: No results for input(s): VITAMINB12, FOLATE, FERRITIN, TIBC, IRON, RETICCTPCT in the last 72 hours. Sepsis Labs: No results for input(s): PROCALCITON, LATICACIDVEN in the last 168 hours.  Recent Results (from the past 240 hour(s))  SARS Coronavirus 2 by RT PCR (hospital order, performed in [facility] [facility] hospital lab) Nasopharyngeal Nasopharyngeal Swab     Status: None   Collection Time: 10/23/19 11:52 AM   Specimen: Nasopharyngeal Swab  Result Value Ref Range Status   SARS Coronavirus 2 NEGATIVE NEGATIVE Final    Comment: (NOTE) SARS-CoV-2 target nucleic acids are NOT DETECTED.  The SARS-CoV-2 RNA is generally detectable in upper and lower respiratory specimens during the acute phase of infection. The lowest concentration of SARS-CoV-2 viral copies this assay can detect is 250 copies / mL. A negative result does not  preclude SARS-CoV-2 infection and should not be used as the sole basis for treatment or other patient management decisions.  A negative result may occur with improper specimen collection / handling, submission of specimen other than nasopharyngeal swab, presence of viral mutation(s) within the areas targeted by this assay, and inadequate number of viral copies (<250 copies / mL). A negative result must be combined with clinical observations, patient history, and epidemiological information.  Fact Sheet for Patients:   StrictlyIdeas.no  Fact Sheet for Healthcare Providers: BankingDealers.co.za  This test is not yet approved or  cleared by the [place] FDA and has been authorized for detection and/or diagnosis of SARS-CoV-2 by FDA under an Emergency Use Authorization (EUA).  This EUA will remain in effect (meaning this test can be used) for the duration of the COVID-19 declaration under Section 564(b)(1) of the Act, 21 U.S.C. section 360bbb-3(b)(1), unless the authorization is terminated or revoked sooner.  Performed at [facility], [address], [place], [place] [address]   Gram stain  Status: None   Collection Time: 10/24/19  2:22 PM   Specimen: Ascitic; Body Fluid  Result Value Ref Range Status   Specimen Description ASCITIC  Final   Special Requests NONE  Final   Gram Stain   Final    CYTOSPIN SMEAR NO ORGANISMS SEEN WBC PRESENT, PREDOMINANTLY MONONUCLEAR Performed at [facility], [address], [place], [place] [address]    Report Status 10/24/2019 FINAL  Final  Culture, body fluid-bottle     Status: None   Collection Time: 10/24/19  2:25 PM   Specimen: Ascitic  Result Value Ref Range Status   Specimen Description ASCITIC  Final   Special Requests 10CC  Final   Culture   Final    NO GROWTH 5 DAYS Performed at [facility], [address] [place], [place] [address]    Report Status 10/29/2019 FINAL  Final      Radiology Studies: CARDIAC CATHETERIZATION  Result Date: 10/28/2019  Hemodynamic findings consistent with mild pulmonary hypertension.  Large V wave on PCWP waveform-suggestive of mitral valve disease.  Mild Pulmonary Hypertension - (TPG 11 mmHg) The waveform on PCWP suggest much disease, not seen on echo. [name], MD   US Abdomen Limited  Result Date: 10/28/2019 CLINICAL DATA:  Evaluate for ascites EXAM: LIMITED ABDOMEN ULTRASOUND FOR ASCITES TECHNIQUE: Limited ultrasound survey for ascites was performed in all four abdominal quadrants. COMPARISON:  10/24/2019 FINDINGS: Changes of cirrhosis of the liver are noted. Mild to moderate ascites is seen similar to that noted on the prior exam. IMPRESSION: Recurrent ascites and changes of hepatic cirrhosis. Electronically Signed   By: Inez Catalina M.D.   On: 10/28/2019 14:41    Scheduled Meds: . fluticasone  1 spray Each Nare QHS  . furosemide  80 mg Intravenous BID  . HYDROcodone-acetaminophen  1 tablet Oral BID  . insulin aspart  0-5 Units Subcutaneous QHS  . insulin aspart  0-6 Units Subcutaneous TID WC  . lactulose  20 g Oral Daily  . loratadine  10 mg Oral Daily  . metoprolol succinate  25 mg Oral q morning - 10a  . pantoprazole  40 mg Oral QAC breakfast  . potassium chloride  10 mEq Oral q morning - 10a  . simvastatin  40 mg Oral Q2000  . sodium chloride flush  3 mL Intravenous Q12H  . spironolactone  50 mg Oral Daily  . vitamin B-12  500 mcg Oral Daily  . Vitamin D (Ergocalciferol)  50,000 Units Oral Q Sun  . Warfarin - Pharmacist Dosing Inpatient   Does not apply q1600   Continuous Infusions: . sodium chloride       LOS: 6 days   Marylu Lund, MD [facility] Pager On Amion  If 7PM-7AM, please contact night-coverage 10/29/2019, 4:45 PM

## 2019-10-29 NOTE — Progress Notes (Signed)
Placed patient on CPAP for the night via auto-mode.  

## 2019-10-29 NOTE — Progress Notes (Signed)
ANTICOAGULATION CONSULT NOTE - Follow-Up  Pharmacy Consult for warfarin Indication: mechanical AVR and afib  Allergies  Allergen Reactions  . Tape Itching    Redness, Please use "paper" tape only    Patient Measurements: Height: 5\' 6"  (167.6 cm) Weight: 61.4 kg (135 lb 5.8 oz) IBW/kg (Calculated) : 59.3  Vital Signs: Temp: 98.2 F (36.8 C) (08/11 0419) Temp Source: Oral (08/11 0419) BP: 101/45 (08/11 0918) Pulse Rate: 79 (08/11 0918)  Labs: Recent Labs    10/27/19 0600 10/28/19 0455 10/29/19 0653  HGB  --  8.7* 8.9*  HCT  --  27.7* 28.2*  PLT  --  132* 130*  LABPROT 30.3* 28.4* 26.8*  INR 3.0* 2.8* 2.6*  CREATININE 2.26* 2.11* 2.14*    Estimated Creatinine Clearance: 19.3 mL/min (A) (by C-G formula based on SCr of 2.14 mg/dL (H)).   Medical History: Past Medical History:  Diagnosis Date  . Anxiety   . Diabetes mellitus with neuropathy ([place])   . Diabetic neuropathy ([place])   . DJD (degenerative joint disease)   . H/O aortic valve replacement   . Hyperlipidemia   . Hypertension   . Neuromuscular disorder (HCC)    neuropathy in feet  . Obesity   . Sleep apnea   . Stroke [facility])    " light stroke"  . Wears glasses     Assessment: Pharmacy consulted to dose warfarin for this patient with a history of mechanical AVR and afib.  She takes 2 mg daily at home.  INR has been therapeutic on this regimen.  Warfarin dose held 8/9 for cardiac cath 8/10.  Cath complete, ok to resume warfarin.  Increased dose given last night to cover prior held dose.  INR today is therapeutic at 2.6. CBC stable  Goal of Therapy:  INR 2.5-3.5 Monitor platelets by anticoagulation protocol: Yes   Plan:  Warfarin 2 mg PO x 1 tonight Monitor daily INR for warfarin dose adjustments, CBC, s/s bleeding  Vertis Kelch, PharmD, BCPS Phone [phone] 10/29/2019       9:33 AM  Please check [email or web address] for unit-specific pharmacist phone numbers

## 2019-10-29 NOTE — Progress Notes (Signed)
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 TPG of 11 mmHg. Echo confirms significant right heart failure - there is cirrhosis with high cardiac output and normal LV function. Diuresed another 1L negative overnight - Creatinine improved to 2.11 (from 2.26). K+ 3.5 today. INR 2.8. Abdominal ultrasound shows recurrent ascites which is mild to moderate.  Inpatient Medications    Scheduled Meds: . fluticasone  1 spray Each Nare QHS  . furosemide  80 mg Intravenous BID  . HYDROcodone-acetaminophen  1 tablet Oral BID  . insulin aspart  0-5 Units Subcutaneous QHS  . insulin aspart  0-6 Units Subcutaneous TID WC  . lactulose  20 g Oral Daily  . loratadine  10 mg Oral Daily  . metoprolol succinate  25 mg Oral q morning - 10a  . pantoprazole  40 mg Oral QAC breakfast  . potassium chloride  10 mEq Oral q morning - 10a  . simvastatin  40 mg Oral Q2000  . sodium chloride flush  3 mL Intravenous Q12H  . vitamin B-12  500 mcg Oral Daily  . Vitamin D (Ergocalciferol)  50,000 Units Oral Q Sun  . Warfarin - Pharmacist Dosing Inpatient   Does not apply q1600   Continuous Infusions: . sodium chloride     PRN Meds: sodium chloride, acetaminophen, albuterol, ALPRAZolam, bisacodyl, ondansetron **OR** ondansetron (ZOFRAN) IV, sodium chloride flush, traZODone   Vital Signs    Vitals:   10/28/19 1556 10/28/19 1911 10/29/19 0031 10/29/19 0419  BP: (!) 89/52 (!) 107/46  (!) 107/55  Pulse: 78 73  93  Resp: 16 18  18   Temp: 98.5 F (36.9 C) 98.2 F (36.8 C)  98.2 F (36.8 C)  TempSrc: Oral Oral  Oral  SpO2: 97% 99%  98%  Weight:   61.4 kg   Height:        Intake/Output Summary (Last 24 hours) at 10/29/2019 0998 Last  data filed at 10/28/2019 2053 Gross per 24 hour  Intake 720 ml  Output 1650 ml  Net -930 ml    Last 3 Weights 10/29/2019 10/28/2019 10/27/2019  Weight (lbs) 135 lb 5.8 oz 137 lb 5.6 oz 151 lb 7.3 oz  Weight (kg) 61.4 kg 62.3 kg 68.7 kg      Telemetry    Afib, rate controlled - Personally Reviewed  ECG    N/A - Personally Reviewed  Physical Exam   General: frail, elderly, female in no acute distress Head: Eyes PERRLA, Head normocephalic and atraumatic Lungs: rales bases bilaterally to auscultation. Heart: slightly irregular R&R, S1 S2, without rub or gallop. 2/6 murmur, crisp valve click. 4/4 extremity pulses are 2+ & equal. JVD to jaw Abdomen: Bowel sounds are present, distended abdomen with +fluid wave Msk: weak strength and tone for age. Extremities: No clubbing, cyanosis or edema.    Skin:  No rashes or lesions noted. Neuro: Alert and oriented X 3. Psych:  Good affect, responds appropriately, gets anxious easily, anxious about the procedure  Labs    Chemistry Recent Labs  Lab 10/26/19 0654 10/27/19 0600 10/28/19 0455  NA 136 135 136  K 3.2* 3.9 3.5  CL 93* 91* 92*  CO2 31 32 32  GLUCOSE 86 85 100*  BUN 52* 53* 44*  CREATININE 2.12* 2.26* 2.11*  CALCIUM 8.7* 8.8* 8.7*  ALBUMIN 2.4* 2.5*  --   GFRNONAA 21* 20* 21*  GFRAA 25* 23* 25*  ANIONGAP 12 12 12     Lab Results  Component Value Date   ALT 9 10/20/2019   AST 24 10/20/2019   ALKPHOS 67 10/20/2019   BILITOT 1.5 (H) 10/20/2019   Hematology Recent Labs  Lab 10/24/19 0748 10/25/19 0607 10/28/19 0455  WBC 4.4 4.0 4.7  RBC 2.55* 2.60* 2.52*  HGB 9.1* 9.2* 8.7*  HCT 28.7* 29.6* 27.7*  MCV 112.5* 113.8* 109.9*  MCH 35.7* 35.4* 34.5*  MCHC 31.7 31.1 31.4  RDW 16.5* 16.7* 16.5*  PLT 137* 133* 132*    Cardiac Enzymes High Sensitivity Troponin:  No results for input(s): TROPONINIHS in the last 720 hours.     BNP Recent Labs  Lab 10/23/19 1103  BNP 565.0*    Lab Results  Component Value Date    INR 2.8 (H) 10/28/2019   INR 3.0 (H) 10/27/2019   INR 3.0 (H) 10/26/2019   Ammonia  Date Value Ref Range Status  10/23/2019 53 (H) 9 - 35 umol/L Final    Comment:    Performed at [facility], [address] [place], [place] [address]    Radiology    US Abdomen Complete  Result Date: 10/24/2019 CLINICAL DATA:  Abdominal distension EXAM: ABDOMEN ULTRASOUND COMPLETE COMPARISON:  03/27/2016 FINDINGS: Gallbladder: Gallbladder is well distended without gallbladder wall thickening. Cholelithiasis and gallbladder sludge is seen. Common bile duct: Diameter: 3.1 mm. Liver: Liver is shrunken with a nodular appearance. No focal mass lesion is noted. Stable cyst is noted in the left lobe of the liver measuring 1.8 cm in greatest dimension. Portal vein is patent on color Doppler imaging with normal direction of blood flow towards the liver. IVC: No abnormality visualized. Pancreas: Visualized portion unremarkable. Spleen: Size and appearance within normal limits. Right Kidney: Length: 7.9 cm. Slight increased echogenicity is noted. Scarring is also noted in the upper pole of the right kidney. No obstructive changes are seen. Left Kidney: Length: 9.4 cm. Echogenicity within normal limits. No mass or hydronephrosis visualized. Abdominal aorta: No aneurysm visualized. Other findings: Ascites is noted. Additionally a right-sided pleural effusion is noted. IMPRESSION: Changes consistent with cirrhosis of the liver with associated ascites. Right-sided pleural effusion. Left hepatic cyst stable from the prior study. Cholelithiasis and gallbladder sludge without complicating factors. Electronically Signed   By: Inez Catalina M.D.   On: 10/24/2019 09:02   DG Chest Portable 1 View  Result Date: 10/23/2019 CLINICAL DATA:  Evaluate for pulmonary edema EXAM: PORTABLE CHEST 1 VIEW COMPARISON:  10/14/2019 FINDINGS: Cardiomegaly. Prior median sternotomy. Moderate right pleural effusion with right lower lobe atelectasis. Areas  of scarring in the lungs bilaterally. No overt edema. No acute bony abnormality. IMPRESSION: Stable moderate right effusion with right base atelectasis. Cardiomegaly. Chronic changes.  No overt edema. Electronically Signed   By: Rolm Baptise M.D.   On: 10/23/2019 11:00   ECHOCARDIOGRAM COMPLETE  Result Date: 10/23/2019    ECHOCARDIOGRAM REPORT   Patient Name:   Mary [name] Date of Exam: 10/23/2019 Medical Rec #:  [id number]   Height:       66.0 in Accession #:    [id number]  Weight:       160.2 lb Date of Birth:  Aug 04, 1940   BSA:          1.820 m Patient Age:    52 years  BP:           119/65 mmHg Patient Gender: F           HR:           71 bpm. Exam Location:  [facility] Procedure: 2D Echo Indications:    Dyspnea 786.09 / R06.00  History:        Patient has prior history of Echocardiogram examinations, most                 recent 04/24/2019. Stroke, Arrythmias:Atrial Fibrillation; Risk                 Factors:Non-Smoker, Dyslipidemia, Diabetes and Hypertension.                 CKD.  Sonographer:    Leavy Cella RDCS (AE) Referring Phys: [id number] [place]  1. Left ventricular ejection fraction, by estimation, is 55 to 60%. The left ventricle has normal function. The left ventricle has no regional wall motion abnormalities. Left ventricular diastolic parameters are indeterminate.  2. Right ventricular systolic function is severely reduced. The right ventricular size is severely enlarged. There is moderately elevated pulmonary artery systolic pressure.  3. Left atrial size was moderately dilated.  4. Right atrial size was severely dilated.  5. The mitral valve is normal in structure. Mild mitral valve regurgitation. No evidence of mitral stenosis.  6. The tricuspid valve is abnormal. Tricuspid valve regurgitation is severe.  7. There is a mechanical valve in the AV position. From medical record St Jude valve, unknown type and size. . The aortic valve has been repaired/replaced. Aortic valve  regurgitation is not visualized. No aortic stenosis is present.  8. Severe pulmonary HTN, PASP is 65 mmHg.  9. The inferior vena cava is dilated in size with <50% respiratory variability, suggesting right atrial pressure of 15 mmHg. FINDINGS  Left Ventricle: Left ventricular ejection fraction, by estimation, is 55 to 60%. The left ventricle has normal function. The left ventricle has no regional wall motion abnormalities. The left ventricular internal cavity size was small. There is no left ventricular hypertrophy. Left ventricular diastolic parameters are indeterminate. Right Ventricle: The right ventricular size is severely enlarged. No increase in right ventricular wall thickness. Right ventricular systolic function is severely reduced. There is moderately elevated pulmonary artery systolic pressure. The tricuspid regurgitant velocity is 3.46 m/s, and with an assumed right atrial pressure of 10 mmHg, the estimated right ventricular systolic pressure is 81.1 mmHg. Left Atrium: Left atrial size was moderately dilated. Right Atrium: Right atrial size was severely dilated. Pericardium: There is no evidence of pericardial effusion. Mitral Valve: The mitral valve is normal in structure. There is mild thickening of the mitral valve leaflet(s). There is mild calcification of the mitral valve leaflet(s). Mild mitral annular calcification. Mild mitral valve regurgitation. No evidence of  mitral valve stenosis. Tricuspid Valve: The tricuspid valve is abnormal. Tricuspid valve regurgitation is severe. No evidence of tricuspid stenosis. Aortic Valve: There is a mechanical valve in the AV position. From medical record St Jude valve, unknown type and size. The aortic valve has been repaired/replaced. Aortic valve regurgitation is not visualized. No aortic stenosis is present. Aortic valve  mean gradient measures 3.8 mmHg. Aortic valve peak gradient measures 6.4 mmHg. Pulmonic Valve: The pulmonic valve was not well visualized.  Pulmonic valve regurgitation is mild to moderate. No evidence of pulmonic stenosis. Aorta: The aortic root is normal in size and structure. Pulmonary Artery: Severe pulmonary HTN,  PASP is 65 mmHg. Venous: The inferior vena cava is dilated in size with less than 50% respiratory variability, suggesting right atrial pressure of 15 mmHg. IAS/Shunts: The interatrial septum was not well visualized.  LEFT VENTRICLE PLAX 2D LVIDd:         4.01 cm Diastology LVIDs:         3.50 cm LV e' lateral:   7.97 cm/s LV PW:         1.13 cm LV E/e' lateral: 14.1 LV IVS:        0.70 cm LV e' medial:    7.31 cm/s                        LV E/e' medial:  15.3  RIGHT VENTRICLE RV S prime:     4.05 cm/s TAPSE (M-mode): 0.8 cm LEFT ATRIUM             Index       RIGHT ATRIUM           Index LA diam:        3.60 cm 1.98 cm/m  RA Area:     18.80 cm LA Vol (A2C):   69.0 ml 37.92 ml/m RA Volume:   54.60 ml  30.00 ml/m LA Vol (A4C):   59.8 ml 32.86 ml/m LA Biplane Vol: 64.9 ml 35.66 ml/m  AORTIC VALVE AV Vmax:           126.67 cm/s AV Vmean:          93.430 cm/s AV VTI:            0.268 m AV Peak Grad:      6.4 mmHg AV Mean Grad:      3.8 mmHg LVOT Vmax:         64.97 cm/s LVOT Vmean:        44.995 cm/s LVOT VTI:          0.146 m LVOT/AV VTI ratio: 0.55  AORTA Ao Root diam: 2.30 cm MITRAL VALVE                TRICUSPID VALVE MV Area (PHT): 3.33 cm     TR Peak grad:   47.9 mmHg MV Decel Time: 228 msec     TR Vmax:        346.00 cm/s MV E velocity: 112.00 cm/s                             SHUNTS                             Systemic VTI: 0.15 m Carlyle Dolly MD Electronically signed by Carlyle Dolly MD Signature Date/Time: 10/23/2019/3:56:43 PM    Final     Cardiac Studies   Echocardiogram: 10/23/2019 IMPRESSIONS    1. Left ventricular ejection fraction, by estimation, is 55 to 60%. The  left ventricle has normal function. The left ventricle has no regional  wall motion abnormalities. Left ventricular diastolic parameters are   indeterminate.  2. Right ventricular systolic function is severely reduced. The right  ventricular size is severely enlarged. There is moderately elevated  pulmonary artery systolic pressure.  3. Left atrial size was moderately dilated.  4. Right atrial size was severely dilated.  5. The mitral valve is normal in structure. Mild mitral valve  regurgitation. No evidence of mitral stenosis.  6. The tricuspid valve is abnormal. Tricuspid valve regurgitation is  severe.  7. There is a mechanical valve in the AV position. From medical record St  Jude valve, unknown type and size. . The aortic valve has been  repaired/replaced. Aortic valve regurgitation is not visualized. No aortic  stenosis is present.  8. Severe pulmonary HTN, PASP is 65 mmHg.  9. The inferior vena cava is dilated in size with <50% respiratory  variability, suggesting right atrial pressure of 15 mmHg.   Patient Profile     79 y.o. female w/ PMH of chronic diastolic CHF, persistent atrial fibrillation, mechanical AVR in 1995, HTN, HLD, and OSA who was sent to the Emergency Department from the office on 10/23/2019 for an acute CHF exacerbation and further evaluation of her cirrhosis.   Assessment & Plan    1. Acute on Chronic Diastolic CHF/ R sided CHF  - ?anasarca or cor pulmonale, w/ RV dysfunction and pulm HTN on echo (nl 2017) - for R heart cath today, all questions answered to their satisfaction - s/p paracentesis 08/06 w/ 4.2 L off>>repeat ultrasound suggests fluid reaccumulation, may need repeat paracentesis. - Continue IV diuresis - add lower dose aldactone, monitor for hyperkalemia  2. Persistent Atrial Fibrillation - controlled rate  3. Aortic Valve Replacement - mech AVR 1995, functioning well on echo  - coumadin therapeutic  4. Pulmonary HTN - PASP 65 mmHg by echo 08/05, improved significantly with diuresis by RHC to mild, suggests pulmonary venous  5. Cirrhosis - cirrhosis and recurrent  ascites seen, may need repeat paracentesis - LFTs ok except alb 2.5, T bili 1.5 - ammonia 53 on 08/05 - on Lactulose - ? TIPS evaluation - Add aldactone 50 mg daily - monitor for hyperkalemia  6. AKI - Creatinine improved somewhat with diuresis.  - Potassium trending down, may tolerate aldactone  Pixie Casino, MD, [facility], [place] Director of the [facility]  Card[facility] Diplomate of the American Board of Clinical Lipidology Attending Cardiologist  Direct Dial: [phone]  Fax: [phone]  Website:  [email or web address] 7:22 AM 10/29/2019

## 2019-10-29 NOTE — Plan of Care (Signed)
  Problem: Education: Goal: Knowledge of General Education information will improve Description: Including pain rating scale, medication(s)/side effects and non-pharmacologic comfort measures Outcome: Progressing   Problem: Health Behavior/Discharge Planning: Goal: Ability to manage health-related needs will improve Outcome: Progressing   Problem: Clinical Measurements: Goal: Ability to maintain clinical measurements within normal limits will improve Outcome: Progressing Goal: Will remain free from infection Outcome: Progressing Goal: Cardiovascular complication will be avoided Outcome: Progressing   Problem: Elimination: Goal: Will not experience complications related to bowel motility Outcome: Progressing

## 2019-10-30 ENCOUNTER — Telehealth: Payer: Self-pay | Admitting: Cardiology

## 2019-10-30 DIAGNOSIS — I50813 Acute on chronic right heart failure: Secondary | ICD-10-CM

## 2019-10-30 LAB — BASIC METABOLIC PANEL
Anion gap: 10 (ref 5–15)
BUN: 43 mg/dL — ABNORMAL HIGH (ref 8–23)
CO2: 33 mmol/L — ABNORMAL HIGH (ref 22–32)
Calcium: 8.5 mg/dL — ABNORMAL LOW (ref 8.9–10.3)
Chloride: 91 mmol/L — ABNORMAL LOW (ref 98–111)
Creatinine, Ser: 2.07 mg/dL — ABNORMAL HIGH (ref 0.44–1.00)
GFR calc Af Amer: 25 mL/min — ABNORMAL LOW (ref >60)
GFR calc non Af Amer: 22 mL/min — ABNORMAL LOW (ref >60)
Glucose, Bld: 89 mg/dL (ref 70–99)
Potassium: 3.7 mmol/L (ref 3.5–5.1)
Sodium: 134 mmol/L — ABNORMAL LOW (ref 135–145)

## 2019-10-30 LAB — GLUCOSE, CAPILLARY
Glucose-Capillary: 78 mg/dL (ref 70–99)
Glucose-Capillary: 124 mg/dL — ABNORMAL HIGH (ref 70–99)
Glucose-Capillary: 122 mg/dL — ABNORMAL HIGH (ref 70–99)
Glucose-Capillary: 239 mg/dL — ABNORMAL HIGH (ref 70–99)

## 2019-10-30 LAB — PROTIME-INR
INR: 2.9 — ABNORMAL HIGH (ref 0.8–1.2)
Prothrombin Time: 29.5 seconds — ABNORMAL HIGH (ref 11.4–15.2)

## 2019-10-30 MED ORDER — PREDNISONE 20 MG PO TABS
40.0000 mg | ORAL_TABLET | Freq: Every day | ORAL | Status: DC
  Administered 2019-10-30: 40 mg via ORAL
  Filled 2019-10-30: qty 2

## 2019-10-30 MED ORDER — WARFARIN SODIUM 2 MG PO TABS
2.0000 mg | ORAL_TABLET | Freq: Every day | ORAL | Status: DC
  Administered 2019-10-30 – 2019-11-01 (×3): 2 mg via ORAL
  Filled 2019-10-30 (×3): qty 1

## 2019-10-30 NOTE — Plan of Care (Signed)
  Problem: Education: Goal: Knowledge of General Education information will improve Description Including pain rating scale, medication(s)/side effects and non-pharmacologic comfort measures Outcome: Progressing   Problem: Health Behavior/Discharge Planning: Goal: Ability to manage health-related needs will improve Outcome: Progressing   

## 2019-10-30 NOTE — Telephone Encounter (Signed)
I will FYI Dr.Branch ?

## 2019-10-30 NOTE — Plan of Care (Signed)

## 2019-10-30 NOTE — Evaluation (Signed)
Physical Therapy Evaluation Patient Details Name: [name] MRN: [id number] DOB: [birth year] Today's Date: 10/30/2019   History of Present Illness  79 y.o. female w/ PMH of chronic diastolic CHF, persistent atrial fibrillation, mechanical AVR in 1995, HTN, HLD, and OSA who was sent to the Emergency Department from the office on 10/23/2019 for an acute CHF exacerbation and further evaluation of her cirrhosis  Clinical Impression  Pt admitted with above diagnosis. Pt was mod assist overall for mobility today. Pt having difficulty with all aspects for mobility.  Pt also incontinent of urine upon standing.  Husband very attentive however question his safety as he staggers at times with his walking. Discussed with pt and husband about managing at home and husband states he can care for pt.  For ultimate safety, feel that SNF for rehab would benefit pt. Just unsure if pt and husband will agree. Pt currently with functional limitations due to the deficits listed below (see PT Problem List). Pt will benefit from skilled PT to increase their independence and safety with mobility to allow discharge to the venue listed below.      Follow Up Recommendations SNF;Supervision/Assistance - 24 hour    Equipment Recommendations  None recommended by PT    Recommendations for Other Services       Precautions / Restrictions Precautions Precautions: Fall Restrictions Weight Bearing Restrictions: No      Mobility  Bed Mobility Overal bed mobility: Needs Assistance Bed Mobility: Supine to Sit     Supine to sit: Mod assist;HOB elevated     General bed mobility comments: Needed incr assist for elevation of trunk.   Transfers Overall transfer level: Needs assistance Equipment used: Rolling walker (2 wheeled) Transfers: Sit to/from [facility] Sit to Stand: Mod assist;From elevated surface Stand pivot transfers: Min guard;Min assist       General transfer comment: Mod assist to power  up. Once up, pt took a step and began urinating.  Took incr time for pt to take pivotal steps with pt wiht decr weight on right LE entire pivot.  Cleaned pt and changed linens. Very flexed posture and pt would not stand upright even with cues. Husband says that is premorbid.   Ambulation/Gait             General Gait Details: unable to progress today  Stairs            Wheelchair Mobility    Modified Rankin (Stroke Patients Only)       Balance Overall balance assessment: Needs assistance Sitting-balance support: No upper extremity supported;Feet supported Sitting balance-Leahy Scale: Fair     Standing balance support: Bilateral upper extremity supported;During functional activity Standing balance-Leahy Scale: Poor Standing balance comment: Relies on UE support and needed external support.                              Pertinent Vitals/Pain Pain Assessment: No/denies pain    Home Living Family/patient expects to be discharged to:: Private residence Living Arrangements: Spouse/significant other Available Help at Discharge: Family;Available 24 hours/day Type of Home: House Home Access: Ramped entrance     Home Layout: One level Home Equipment: Walker - 2 wheels;Walker - 4 wheels;Wheelchair - [employer];Shower seat - built in;Hospital bed;Cane - single point;Grab bars - tub/shower      Prior Function Level of Independence: Needs assistance;Independent with assistive device(s)   Gait / Transfers Assistance Needed: used rollator in house with  Modif I and outdoors  ADL's / Homemaking Assistance Needed: B/D self, husband helps her get in tub when she showers but she does I sink baths.         Hand Dominance   Dominant Hand: Right    Extremity/Trunk Assessment   Upper Extremity Assessment Upper Extremity Assessment: Defer to OT evaluation    Lower Extremity Assessment Lower Extremity Assessment: Generalized weakness    Cervical /  Trunk Assessment Cervical / Trunk Assessment: Kyphotic  Communication   Communication: No difficulties  Cognition Arousal/Alertness: Awake/alert Behavior During Therapy: WFL for tasks assessed/performed Overall Cognitive Status: Within Functional Limits for tasks assessed                                        General Comments      Exercises     Assessment/Plan    PT Assessment Patient needs continued PT services  PT Problem List Decreased activity tolerance;Decreased balance;Decreased mobility;Decreased knowledge of use of DME;Decreased safety awareness;Decreased knowledge of precautions       PT Treatment Interventions DME instruction;Gait training;Functional mobility training;Therapeutic activities;Therapeutic exercise;Balance training;Patient/family education    PT Goals (Current goals can be found in the Care Plan section)  Acute Rehab PT Goals Patient Stated Goal: to get better PT Goal Formulation: With patient Time For Goal Achievement: 11/13/19 Potential to Achieve Goals: Good    Frequency Min 3X/week   Barriers to discharge        Co-evaluation               AM-PAC PT "6 Clicks" Mobility  Outcome Measure Help needed turning from your back to your side while in a flat bed without using bedrails?: A Lot Help needed moving from lying on your back to sitting on the side of a flat bed without using bedrails?: A Lot Help needed moving to and from a bed to a chair (including a wheelchair)?: A Lot Help needed standing up from a chair using your arms (e.g., wheelchair or bedside chair)?: A Lot Help needed to walk in hospital room?: Total Help needed climbing 3-5 steps with a railing? : Total 6 Click Score: 10    End of Session Equipment Utilized During Treatment: Gait belt Activity Tolerance: Patient limited by fatigue Patient left: in chair;with call bell/phone within reach;with chair alarm set;with family/visitor present Nurse  Communication: Mobility status PT Visit Diagnosis: Unsteadiness on feet (R26.81);Muscle weakness (generalized) (M62.81)    Time: 6962-9528 PT Time Calculation (min) (ACUTE ONLY): 32 min   Charges:   PT Evaluation $PT Eval Moderate Complexity: 1 Mod PT Treatments $Therapeutic Activity: 8-22 mins        Jabreel Chimento W,PT Acute Rehabilitation Services Pager:  [phone]  Office:  [place] 10/30/2019, 12:30 PM

## 2019-10-30 NOTE — Telephone Encounter (Signed)
New message    Patient wants to go over his wifes condition and the results of heart cath with Dr Harl Bowie, [facility] and they dont give the personal treatment like [facility], he would just like to speak with Dr Harl Bowie to know that she is getting the best treatment possible and what the next steps are gonna be.

## 2019-10-30 NOTE — Progress Notes (Signed)
Patient c/o pain right ankle with gentle touch husband ask that I let MD know he thinks she has Gout,so [employer] account planner Md has husband has requested.

## 2019-10-30 NOTE — Progress Notes (Signed)
Progress Note  Patient Name: Mary [name] Date of Encounter: 10/30/2019  Primary Cardiologist: Carlyle Dolly, MD   Subjective   Negative another 1.3L overnight - potassium up slightly to 3.7. Creatinine improved to 2.07. INR 2.9.   Inpatient Medications    Scheduled Meds: . fluticasone  1 spray Each Nare QHS  . furosemide  80 mg Intravenous BID  . HYDROcodone-acetaminophen  1 tablet Oral BID  . insulin aspart  0-5 Units Subcutaneous QHS  . insulin aspart  0-6 Units Subcutaneous TID WC  . lactulose  20 g Oral Daily  . loratadine  10 mg Oral Daily  . metoprolol succinate  25 mg Oral q morning - 10a  . pantoprazole  40 mg Oral QAC breakfast  . potassium chloride  10 mEq Oral q morning - 10a  . simvastatin  40 mg Oral Q2000  . sodium chloride flush  3 mL Intravenous Q12H  . spironolactone  50 mg Oral Daily  . vitamin B-12  500 mcg Oral Daily  . Vitamin D (Ergocalciferol)  50,000 Units Oral Q Sun  . Warfarin - Pharmacist Dosing Inpatient   Does not apply q1600   Continuous Infusions: . sodium chloride     PRN Meds: sodium chloride, acetaminophen, albuterol, ALPRAZolam, bisacodyl, ondansetron **OR** ondansetron (ZOFRAN) IV, sodium chloride flush, traZODone   Vital Signs    Vitals:   10/29/19 1122 10/29/19 2019 10/29/19 2254 10/30/19 0456  BP: (!) 103/54 (!) 111/52  (!) 106/50  Pulse: 73 73 75 71  Resp: 16 15 18 18   Temp: 98 F (36.7 C) 98.1 F (36.7 C)  97.8 F (36.6 C)  TempSrc: Oral Oral    SpO2: 98% 100% 95% 100%  Weight:    59.9 kg  Height:        Intake/Output Summary (Last 24 hours) at 10/30/2019 0831 Last data filed at 10/30/2019 1937 Gross per 24 hour  Intake 1240 ml  Output 2400 ml  Net -1160 ml    Last 3 Weights 10/30/2019 10/29/2019 10/28/2019  Weight (lbs) 132 lb 0.9 oz 135 lb 5.8 oz 137 lb 5.6 oz  Weight (kg) 59.9 kg 61.4 kg 62.3 kg      Telemetry    Afib, rate controlled - Personally Reviewed  ECG    N/A - Personally  Reviewed  Physical Exam   General: frail, elderly, female in no acute distress Head: Eyes PERRLA, Head normocephalic and atraumatic Lungs: rales bases bilaterally to auscultation. Heart: slightly irregular R&R, S1 S2, without rub or gallop. 2/6 murmur, crisp valve click. 4/4 extremity pulses are 2+ & equal. JVP now 5 cm Abdomen: Bowel sounds are present, distended abdomen with +fluid wave suggesting moderate ascites Msk: weak strength and tone for age. Extremities: No clubbing, cyanosis or edema.    Skin:  No rashes or lesions noted. Neuro: Alert and oriented X 3. Psych:  Good affect, responds appropriately, gets anxious easily, anxious about the procedure  Labs    Chemistry Recent Labs  Lab 10/26/19 0654 10/26/19 0654 10/27/19 0600 10/27/19 0600 10/28/19 0455 10/28/19 1114 10/28/19 1115 10/29/19 0653 10/30/19 0427  NA 136   < > 135   < > 136   < > 141 135 134*  K 3.2*   < > 3.9   < > 3.5   < > 3.5 3.6 3.7  CL 93*   < > 91*   < > 92*  --   --  92* 91*  CO2 31   < >  32   < > 32  --   --  32 33*  GLUCOSE 86   < > 85   < > 100*  --   --  90 89  BUN 52*   < > 53*   < > 44*  --   --  43* 43*  CREATININE 2.12*   < > 2.26*   < > 2.11*  --   --  2.14* 2.07*  CALCIUM 8.7*   < > 8.8*   < > 8.7*  --   --  8.6* 8.5*  ALBUMIN 2.4*  --  2.5*  --   --   --   --  2.0*  --   GFRNONAA 21*   < > 20*   < > 21*  --   --  21* 22*  GFRAA 25*   < > 23*   < > 25*  --   --  24* 25*  ANIONGAP 12   < > 12   < > 12  --   --  11 10   < > = values in this interval not displayed.    Lab Results  Component Value Date   ALT 9 10/20/2019   AST 24 10/20/2019   ALKPHOS 67 10/20/2019   BILITOT 1.5 (H) 10/20/2019   Hematology Recent Labs  Lab 10/25/19 0607 10/25/19 0607 10/28/19 0455 10/28/19 0455 10/28/19 1114 10/28/19 1115 10/29/19 0653  WBC 4.0  --  4.7  --   --   --  4.4  RBC 2.60*  --  2.52*  --   --   --  2.54*  HGB 9.2*   < > 8.7*   < > 9.9* 9.2* 8.9*  HCT 29.6*   < > 27.7*   < > 29.0*  27.0* 28.2*  MCV 113.8*  --  109.9*  --   --   --  111.0*  MCH 35.4*  --  34.5*  --   --   --  35.0*  MCHC 31.1  --  31.4  --   --   --  31.6  RDW 16.7*  --  16.5*  --   --   --  16.5*  PLT 133*  --  132*  --   --   --  130*   < > = values in this interval not displayed.    Cardiac Enzymes High Sensitivity Troponin:  No results for input(s): TROPONINIHS in the last 720 hours.     BNP Recent Labs  Lab 10/23/19 1103  BNP 565.0*    Lab Results  Component Value Date   INR 2.9 (H) 10/30/2019   INR 2.6 (H) 10/29/2019   INR 2.8 (H) 10/28/2019   Ammonia  Date Value Ref Range Status  10/23/2019 53 (H) 9 - 35 umol/L Final    Comment:    Performed at [facility], [address], [place], [place] [address]    Radiology    US Abdomen Complete  Result Date: 10/24/2019 CLINICAL DATA:  Abdominal distension EXAM: ABDOMEN ULTRASOUND COMPLETE COMPARISON:  03/27/2016 FINDINGS: Gallbladder: Gallbladder is well distended without gallbladder wall thickening. Cholelithiasis and gallbladder sludge is seen. Common bile duct: Diameter: 3.1 mm. Liver: Liver is shrunken with a nodular appearance. No focal mass lesion is noted. Stable cyst is noted in the left lobe of the liver measuring 1.8 cm in greatest dimension. Portal vein is patent on color Doppler imaging with normal direction of blood flow towards the liver. IVC:  No abnormality visualized. Pancreas: Visualized portion unremarkable. Spleen: Size and appearance within normal limits. Right Kidney: Length: 7.9 cm. Slight increased echogenicity is noted. Scarring is also noted in the upper pole of the right kidney. No obstructive changes are seen. Left Kidney: Length: 9.4 cm. Echogenicity within normal limits. No mass or hydronephrosis visualized. Abdominal aorta: No aneurysm visualized. Other findings: Ascites is noted. Additionally a right-sided pleural effusion is noted. IMPRESSION: Changes consistent with cirrhosis of the liver with associated ascites.  Right-sided pleural effusion. Left hepatic cyst stable from the prior study. Cholelithiasis and gallbladder sludge without complicating factors. Electronically Signed   By: Inez Catalina M.D.   On: 10/24/2019 09:02   DG Chest Portable 1 View  Result Date: 10/23/2019 CLINICAL DATA:  Evaluate for pulmonary edema EXAM: PORTABLE CHEST 1 VIEW COMPARISON:  10/14/2019 FINDINGS: Cardiomegaly. Prior median sternotomy. Moderate right pleural effusion with right lower lobe atelectasis. Areas of scarring in the lungs bilaterally. No overt edema. No acute bony abnormality. IMPRESSION: Stable moderate right effusion with right base atelectasis. Cardiomegaly. Chronic changes.  No overt edema. Electronically Signed   By: Rolm Baptise M.D.   On: 10/23/2019 11:00   ECHOCARDIOGRAM COMPLETE  Result Date: 10/23/2019    ECHOCARDIOGRAM REPORT   Patient Name:   Mary [name] Date of Exam: 10/23/2019 Medical Rec #:  [id number]   Height:       66.0 in Accession #:    [id number]  Weight:       160.2 lb Date of Birth:  1940-11-04   BSA:          1.820 m Patient Age:    31 years    BP:           119/65 mmHg Patient Gender: F           HR:           71 bpm. Exam Location:  [facility] Procedure: 2D Echo Indications:    Dyspnea 786.09 / R06.00  History:        Patient has prior history of Echocardiogram examinations, most                 recent 04/24/2019. Stroke, Arrythmias:Atrial Fibrillation; Risk                 Factors:Non-Smoker, Dyslipidemia, Diabetes and Hypertension.                 CKD.  Sonographer:    Leavy Cella RDCS (AE) Referring Phys: [id number] [place]  1. Left ventricular ejection fraction, by estimation, is 55 to 60%. The left ventricle has normal function. The left ventricle has no regional wall motion abnormalities. Left ventricular diastolic parameters are indeterminate.  2. Right ventricular systolic function is severely reduced. The right ventricular size is severely enlarged. There is moderately  elevated pulmonary artery systolic pressure.  3. Left atrial size was moderately dilated.  4. Right atrial size was severely dilated.  5. The mitral valve is normal in structure. Mild mitral valve regurgitation. No evidence of mitral stenosis.  6. The tricuspid valve is abnormal. Tricuspid valve regurgitation is severe.  7. There is a mechanical valve in the AV position. From medical record St Jude valve, unknown type and size. . The aortic valve has been repaired/replaced. Aortic valve regurgitation is not visualized. No aortic stenosis is present.  8. Severe pulmonary HTN, PASP is 65 mmHg.  9. The inferior vena cava is dilated in size with <50% respiratory  variability, suggesting right atrial pressure of 15 mmHg. FINDINGS  Left Ventricle: Left ventricular ejection fraction, by estimation, is 55 to 60%. The left ventricle has normal function. The left ventricle has no regional wall motion abnormalities. The left ventricular internal cavity size was small. There is no left ventricular hypertrophy. Left ventricular diastolic parameters are indeterminate. Right Ventricle: The right ventricular size is severely enlarged. No increase in right ventricular wall thickness. Right ventricular systolic function is severely reduced. There is moderately elevated pulmonary artery systolic pressure. The tricuspid regurgitant velocity is 3.46 m/s, and with an assumed right atrial pressure of 10 mmHg, the estimated right ventricular systolic pressure is 16.1 mmHg. Left Atrium: Left atrial size was moderately dilated. Right Atrium: Right atrial size was severely dilated. Pericardium: There is no evidence of pericardial effusion. Mitral Valve: The mitral valve is normal in structure. There is mild thickening of the mitral valve leaflet(s). There is mild calcification of the mitral valve leaflet(s). Mild mitral annular calcification. Mild mitral valve regurgitation. No evidence of  mitral valve stenosis. Tricuspid Valve: The tricuspid  valve is abnormal. Tricuspid valve regurgitation is severe. No evidence of tricuspid stenosis. Aortic Valve: There is a mechanical valve in the AV position. From medical record St Jude valve, unknown type and size. The aortic valve has been repaired/replaced. Aortic valve regurgitation is not visualized. No aortic stenosis is present. Aortic valve  mean gradient measures 3.8 mmHg. Aortic valve peak gradient measures 6.4 mmHg. Pulmonic Valve: The pulmonic valve was not well visualized. Pulmonic valve regurgitation is mild to moderate. No evidence of pulmonic stenosis. Aorta: The aortic root is normal in size and structure. Pulmonary Artery: Severe pulmonary HTN, PASP is 65 mmHg. Venous: The inferior vena cava is dilated in size with less than 50% respiratory variability, suggesting right atrial pressure of 15 mmHg. IAS/Shunts: The interatrial septum was not well visualized.  LEFT VENTRICLE PLAX 2D LVIDd:         4.01 cm Diastology LVIDs:         3.50 cm LV e' lateral:   7.97 cm/s LV PW:         1.13 cm LV E/e' lateral: 14.1 LV IVS:        0.70 cm LV e' medial:    7.31 cm/s                        LV E/e' medial:  15.3  RIGHT VENTRICLE RV S prime:     4.05 cm/s TAPSE (M-mode): 0.8 cm LEFT ATRIUM             Index       RIGHT ATRIUM           Index LA diam:        3.60 cm 1.98 cm/m  RA Area:     18.80 cm LA Vol (A2C):   69.0 ml 37.92 ml/m RA Volume:   54.60 ml  30.00 ml/m LA Vol (A4C):   59.8 ml 32.86 ml/m LA Biplane Vol: 64.9 ml 35.66 ml/m  AORTIC VALVE AV Vmax:           126.67 cm/s AV Vmean:          93.430 cm/s AV VTI:            0.268 m AV Peak Grad:      6.4 mmHg AV Mean Grad:      3.8 mmHg LVOT Vmax:  64.97 cm/s LVOT Vmean:        44.995 cm/s LVOT VTI:          0.146 m LVOT/AV VTI ratio: 0.55  AORTA Ao Root diam: 2.30 cm MITRAL VALVE                TRICUSPID VALVE MV Area (PHT): 3.33 cm     TR Peak grad:   47.9 mmHg MV Decel Time: 228 msec     TR Vmax:        346.00 cm/s MV E velocity: 112.00 cm/s                              SHUNTS                             Systemic VTI: 0.15 m Carlyle Dolly MD Electronically signed by Carlyle Dolly MD Signature Date/Time: 10/23/2019/3:56:43 PM    Final     Cardiac Studies   Echocardiogram: 10/23/2019 IMPRESSIONS    1. Left ventricular ejection fraction, by estimation, is 55 to 60%. The  left ventricle has normal function. The left ventricle has no regional  wall motion abnormalities. Left ventricular diastolic parameters are  indeterminate.  2. Right ventricular systolic function is severely reduced. The right  ventricular size is severely enlarged. There is moderately elevated  pulmonary artery systolic pressure.  3. Left atrial size was moderately dilated.  4. Right atrial size was severely dilated.  5. The mitral valve is normal in structure. Mild mitral valve  regurgitation. No evidence of mitral stenosis.  6. The tricuspid valve is abnormal. Tricuspid valve regurgitation is  severe.  7. There is a mechanical valve in the AV position. From medical record St  Jude valve, unknown type and size. . The aortic valve has been  repaired/replaced. Aortic valve regurgitation is not visualized. No aortic  stenosis is present.  8. Severe pulmonary HTN, PASP is 65 mmHg.  9. The inferior vena cava is dilated in size with <50% respiratory  variability, suggesting right atrial pressure of 15 mmHg.   Patient Profile     79 y.o. female w/ PMH of chronic diastolic CHF, persistent atrial fibrillation, mechanical AVR in 1995, HTN, HLD, and OSA who was sent to the Emergency Department from the office on 10/23/2019 for an acute CHF exacerbation and further evaluation of her cirrhosis.   Assessment & Plan    1. Acute on Chronic Diastolic CHF/ R sided CHF  - ?anasarca or cor pulmonale, w/ RV dysfunction and pulm HTN on echo (nl 2017) - for R heart cath today, all questions answered to their satisfaction - s/p paracentesis 08/06 w/ 4.2 L off>>repeat  ultrasound suggests fluid reaccumulation, may need repeat paracentesis. - Continue IV diuresis - aldactone added yesterday, small increase in potassium- monitor  2. Persistent Atrial Fibrillation - controlled rate  3. Aortic Valve Replacement - mech AVR 1995, functioning well on echo  - coumadin therapeutic  4. Pulmonary HTN - PASP 65 mmHg by echo 08/05, improved significantly with diuresis by RHC to mild, suggests pulmonary venous HTN - diurese  5. Cirrhosis with ascties - cirrhosis and recurrent ascites seen, may need repeat paracentesis at some point in the near future - LFTs ok except alb 2.5, T bili 1.5 - ammonia 53 on 08/05 - on Lactulose - ? TIPS evaluation -Now on aldactone 50 mg daily - monitor for  hyperkalemia, consider increase to 100 mg daily given improved renal function - would aim for an aldactone:furosemide ratio of 100:120.  6. AKI - Creatinine continues to improve with diuresis.   Pixie Casino, MD, [facility], [place] Director of the [facility]id [facility] &  Card[facility] Diplomate of the American Board of Clinical Lipidology Attending Cardiologist  Direct Dial: [phone]  Fax: [phone]  Website:  [email or web address] Plater 8:31 AM 10/30/2019

## 2019-10-30 NOTE — Evaluation (Addendum)
Occupational Therapy Evaluation Patient Details Name: Mary [name] MRN: [id number] DOB: [birth year] Today's Date: 10/30/2019    History of Present Illness 79 y.o. female presenting with increasingly worsening SOB and BLE swelling. Patient admitted with acute on chronic diastolic CHF with pulmonary HTN and RV dysfunction s/p R heart cath. PMHx significant for CHF, A-fib, DM II with neuropathy, Hx aortic valve replacement, HTN, HLD, OA, and Hx of CVA.     Clinical Impression   Patient presenting with dx. above and deficits listed below currently requiring Mod A to Max A grossly for BADLs and +2 assist for functional transfers and mobility. Patient also limited by pain in RLE this date with inability to take steps at time of evaluation. At time of last hospital admission, patient was demonstrating bed mobility with supervision A and functional mobility and transfers using RW with Min guard. Patient would benefit from continued acute OT services to maximize safety and independence in prep for d/c to next level of care. Given patient's CLOF and deficits in balance, cognition, safety awareness, generalized weakness, and increased need for assistance, recommendation for SNF rehab in prep for safe d/c home with support from husband. Husband present at bedside during evaluation with questionable ability to provide current level of assistance if patient were to d/c home. Patient/family not currently open to idea of possibility for SNF rehab. OT will continue to follow acutely.     Follow Up Recommendations  SNF    Equipment Recommendations  Other (comment) (Defer to next level of care)    Recommendations for Other Services       Precautions / Restrictions Precautions Precautions: Fall Precaution Comments: Urinary incontinence Restrictions Weight Bearing Restrictions: No      Mobility Bed Mobility Overal bed mobility: Needs Assistance Bed Mobility: Supine to Sit     Supine to sit: Mod  assist;HOB elevated     General bed mobility comments: Patient seated in recliner upon entry.  Transfers Overall transfer level: Needs assistance Equipment used: Rolling walker (2 wheeled) Transfers: Sit to/from Stand Sit to Stand: Mod assist Stand pivot transfers: Min guard;Min assist       General transfer comment: Mod A from recliner to RW with cues for hand placement, power negotiation, and sequencing.     Balance Overall balance assessment: Needs assistance Sitting-balance support: No upper extremity supported;Feet supported Sitting balance-Leahy Scale: Fair     Standing balance support: Bilateral upper extremity supported;During functional activity Standing balance-Leahy Scale: Poor Standing balance comment: Relies on UE support and needed external support.                            ADL either performed or assessed with clinical judgement   ADL Overall ADL's : Needs assistance/impaired Eating/Feeding: Set up;Sitting   Grooming: Minimal assistance;Sitting   Upper Body Bathing: Moderate assistance;Sitting   Lower Body Bathing: Maximal assistance;Sitting/lateral leans;Sit to/from stand   Upper Body Dressing : Moderate assistance;Sitting   Lower Body Dressing: Maximal assistance;Sitting/lateral leans;Sit to/from stand Lower Body Dressing Details (indicate cue type and reason): Max A to don footwear seated in Interior and spatial designer: Moderate assistance;+2 for physical assistance;+2 for safety/equipment;RW   Toileting- Clothing Manipulation and Hygiene: Moderate assistance;+2 for safety/equipment;+2 for physical assistance;Sit to/from stand;Sitting/lateral lean         General ADL Comments: Patient unable to take steps 2/2 pain in R leg/foot.      Vision Baseline Vision/History: Wears glasses Patient Visual Report: No change  from baseline Vision Assessment?: No apparent visual deficits     Perception     Praxis      Pertinent Vitals/Pain  Pain Assessment: Faces Faces Pain Scale: Hurts whole lot Pain Location: R leg and foot with mobility Pain Descriptors / Indicators: Grimacing;Guarding Pain Intervention(s): Limited activity within patient's tolerance;Monitored during session;Repositioned     Hand Dominance Right   Extremity/Trunk Assessment Upper Extremity Assessment Upper Extremity Assessment: Generalized weakness   Lower Extremity Assessment Lower Extremity Assessment: Generalized weakness   Cervical / Trunk Assessment Cervical / Trunk Assessment: Kyphotic   Communication Communication Communication: No difficulties   Cognition Arousal/Alertness: Awake/alert Behavior During Therapy: WFL for tasks assessed/performed Overall Cognitive Status: Within Functional Limits for tasks assessed                                     General Comments  Husband present throughout evaluation.     Exercises     Shoulder Instructions      Home Living Family/patient expects to be discharged to:: Private residence Living Arrangements: Spouse/significant other Available Help at Discharge: Family;Available 24 hours/day Type of Home: House Home Access: Ramped entrance     Home Layout: One level     Bathroom Shower/Tub: Teacher, early years/pre: Standard Bathroom Accessibility: Yes How Accessible: Accessible via walker Home Equipment: [employer] - 2 wheels;Walker - 4 wheels;Wheelchair - [employer];Shower seat - built in;Hospital bed;Cane - single point;Grab bars - tub/shower          Prior Functioning/Environment Level of Independence: Needs assistance  Gait / Transfers Assistance Needed: used rollator in house with Modif I and outdoors ADL's / Homemaking Assistance Needed: Husband reports assisting with LB bathing/dressing at baseline. Per med chart, patient sponge bathes at sink level.              OT Problem List: Decreased strength;Decreased activity tolerance;Impaired  balance (sitting and/or standing);Decreased cognition;Decreased safety awareness;Decreased knowledge of use of DME or AE;Pain      OT Treatment/Interventions: Self-care/ADL training;Therapeutic exercise;Energy conservation;DME and/or AE instruction;Therapeutic activities;Patient/family education;Balance training    OT Goals(Current goals can be found in the care plan section) Acute Rehab OT Goals Patient Stated Goal: No goals stated.  OT Goal Formulation: With patient/family Time For Goal Achievement: 11/13/19 Potential to Achieve Goals: Fair ADL Goals Pt Will Perform Lower Body Bathing: with min assist;sitting/lateral leans;sit to/from stand Pt Will Perform Lower Body Dressing: with min assist;sitting/lateral leans;sit to/from stand Pt Will Transfer to Toilet: with min assist;ambulating;bedside commode Pt Will Perform Toileting - Clothing Manipulation and hygiene: with min assist;sitting/lateral leans;sit to/from stand Pt/caregiver will Perform Home Exercise Program: Both right and left upper extremity;Increased strength;With Supervision;With written HEP provided  OT Frequency: Min 2X/week   Barriers to D/C: Decreased caregiver support  Question if husband understands extent of patients CLOF and need for assistance from others for BADLs, functional transfers, and mobility.        Co-evaluation              AM-PAC OT "6 Clicks" Daily Activity     Outcome Measure Help from another person eating meals?: A Little Help from another person taking care of personal grooming?: A Lot Help from another person toileting, which includes using toliet, bedpan, or urinal?: A Lot Help from another person bathing (including washing, rinsing, drying)?: A Lot Help from another person to put on and taking off regular upper  body clothing?: A Little Help from another person to put on and taking off regular lower body clothing?: A Lot 6 Click Score: 14   End of Session Equipment Utilized During  Treatment: Gait belt;Rolling walker  Activity Tolerance: Patient limited by pain Patient left: in chair;with call bell/phone within reach  OT Visit Diagnosis: Unsteadiness on feet (R26.81);Muscle weakness (generalized) (M62.81);Pain Pain - Right/Left: Right Pain - part of body: Leg;Ankle and joints of foot                Time: 1122-1140 OT Time Calculation (min): 18 min Charges:  OT General Charges $OT Visit: 1 Visit OT Evaluation $OT Eval Moderate Complexity: 1 Mod  Jameah Rouser H. OTR/L Supplemental OT, Department of rehab services [phone]  Raziya Aveni R H. 10/30/2019, 3:19 PM

## 2019-10-30 NOTE — Progress Notes (Signed)
ANTICOAGULATION CONSULT NOTE - Follow-Up  Pharmacy Consult for warfarin Indication: mechanical AVR and afib  Allergies  Allergen Reactions   Tape Itching    Redness, Please use "paper" tape only    Patient Measurements: Height: 5\' 6"  (167.6 cm) Weight: 59.9 kg (132 lb 0.9 oz) IBW/kg (Calculated) : 59.3  Vital Signs: Temp: 97.8 F (36.6 C) (08/12 0456) BP: 106/50 (08/12 0456) Pulse Rate: 71 (08/12 0456)  Labs: Recent Labs    10/28/19 0455 10/28/19 0455 10/28/19 1114 10/28/19 1114 10/28/19 1115 10/29/19 0653 10/30/19 0427  HGB 8.7*   < > 9.9*   < > 9.2* 8.9*  --   HCT 27.7*   < > 29.0*  --  27.0* 28.2*  --   PLT 132*  --   --   --   --  130*  --   LABPROT 28.4*  --   --   --   --  26.8* 29.5*  INR 2.8*  --   --   --   --  2.6* 2.9*  CREATININE 2.11*  --   --   --   --  2.14* 2.07*   < > = values in this interval not displayed.    Estimated Creatinine Clearance: 20 mL/min (A) (by C-G formula based on SCr of 2.07 mg/dL (H)).   Medical History: Past Medical History:  Diagnosis Date   Anxiety    Diabetes mellitus with neuropathy (HCC)    Diabetic neuropathy ([place])    DJD (degenerative joint disease)    H/O aortic valve replacement    Hyperlipidemia    Hypertension    Neuromuscular disorder ([place])    neuropathy in feet   Obesity    Sleep apnea    Stroke ([place])    " light stroke"   Wears glasses     Assessment: Pharmacy consulted to dose warfarin for this patient with a history of mechanical AVR and afib.  She takes 2 mg daily at home.  INR has been therapeutic on this regimen.  Warfarin dose held 8/9 for cardiac cath 8/10.  Cath complete, ok to resume warfarin.  Increased dose given last night to cover prior held dose.  INR today remains therapeutic at 2.9. NO significant drug interactions noted.   Goal of Therapy:  INR 2.5-3.5 Monitor platelets by anticoagulation protocol: Yes   Plan:  Resume warfarin 2 mg daily  Monitor daily INR for  warfarin dose adjustments, CBC, s/s bleeding  Albertina Parr, PharmD., BCPS, BCCCP Clinical Pharmacist Clinical phone for 10/30/19 until 3:30pm: [phone] If after 3:30pm, please refer to [facility] for unit-specific pharmacist

## 2019-10-30 NOTE — Plan of Care (Signed)
  Problem: Education: Goal: Knowledge of General Education information will improve Description Including pain rating scale, medication(s)/side effects and non-pharmacologic comfort measures Outcome: Progressing   

## 2019-10-30 NOTE — Progress Notes (Signed)
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,paroxysmal atrial fibrillation, history of prior stroke, history of DM, CKD 3b, OSA admitted on 10/23/2019 with acute on chronic diastolic dysfunction CHF exacerbation and ascites secondary to liver cirrhosis as well as AKI having failed outpatient attempt at oral diuresis  Assessment & Plan:   Principal Problem:   Acute on chronic combined systolic and diastolic CHF (congestive heart failure) (HCC) Active Problems:   Essential hypertension   CVA (cerebral vascular accident) ([place])   S/P aortic valve replacement   Diabetes (HCC)   OSA (obstructive sleep apnea)   CKD (chronic kidney disease), stage III   Other secondary pulmonary hypertension (HCC)   Acute on Chronic HFpEF Hx of pulmonary HTN and RV dysfunction     - 04/2019 echo LVEF 60-65%, elevated LA pressure, moderate RV dysfunction, severe RVE, severe biatrial enlargement, mod MR, severe TR, severe pulm HTN     - Repeat echo on 10/23/2019 with EF down to 55 to 60%, with severe pulmonary hypertension PASP of 65     - failed outpt: torsemide 80mg  bid, metolazone 10mg as needed (her SCr was trending up and she was developing ascites)     -Per cardiology, plan to continue lasix 80mg  IV BID     - cardiology following, s/p RHC 8/10  S/p aortic valve replacement with Saint Jude AVR in 1995/1996     - coumadin held for RHC; INR 2.6 today     - continue coumadin dosing per pharmacy -Seems to be stable at this time  OSA     - history of noncompliance with CPAP -Seems stable currently  Hx of paroxysmal A. fib and prior CVA     - on Coumadin, Toprol-XL; follow BP/HR -Stable at present  Hx of cirrhosis on prior imaging studies     - doesn't appear to  have had work-up for cirrhosis     - abdominal ultrasound consistent with liver cirrhosis with ascites      - ammonia 53; on lactulose, mentation ok; folow     - s/p paracentesis on 10/24/2018 for removal of 4.2 L: fluid studies negative     - Viral hepatitis profile negative, TSH normal, LDL 27     -Continue to follow LFTs  AKI on CKD3b     - SCr on admission was 2.20, baseline creatinine =1.4      - SCr is 2.07 today; watch nephrotoxins, follow -repeat bmet in AM  Chronic macrocytic anemia     - baseline Hgb is about 9     - hgb trends reviewed, stable thus far, noted to be 8.9 on most recent check     - B12 supplement  DMt2     - SSI, DM diet, CBG -glycemic trends reviewed. Stable   DVT prophylaxis: Warfarin Code Status: Full Family Communication: Pt in room, family currently at bedside  Status is: Inpatient  Remains inpatient appropriate because:Unsafe d/c plan, IV treatments appropriate due to intensity of illness or inability to take PO and Inpatient level of care appropriate due to severity of illness   Dispo: The patient is from: Home              Anticipated d/c is to: Home  Anticipated d/c date is: 3 days              Patient currently is not medically stable to d/c.    Consultants:   Cardiology  Procedures:   RHC  Antimicrobials: Anti-infectives (From admission, onward)   None      Subjective: No complaints currently  Objective: Vitals:   10/29/19 2019 10/29/19 2254 10/30/19 0456 10/30/19 1119  BP: (!) 111/52  (!) 106/50 (!) 113/58  Pulse: 73 75 71 73  Resp: 15 18 18 16   Temp: 98.1 F (36.7 C)  97.8 F (36.6 C) 98 F (36.7 C)  TempSrc: Oral   Oral  SpO2: 100% 95% 100% 99%  Weight:   59.9 kg   Height:        Intake/Output Summary (Last 24 hours) at 10/30/2019 1541 Last data filed at 10/30/2019 1941 Gross per 24 hour  Intake 780 ml  Output 1500 ml  Net -720 ml   Filed Weights   10/28/19 0301 10/29/19 0031 10/30/19  0456  Weight: 62.3 kg 61.4 kg 59.9 kg    Examination: General exam: Awake, laying in bed, in nad Respiratory system: Normal respiratory effort, no wheezing Cardiovascular system: regular rate, s1, s2 Gastrointestinal system: Soft, nondistended, positive BS Central nervous system: CN2-12 grossly intact, strength intact Extremities: Perfused, no clubbing Skin: Normal skin turgor, no notable skin lesions seen Psychiatry: Mood normal // no visual hallucinations   Data Reviewed: I have personally reviewed following labs and imaging studies  CBC: Recent Labs  Lab 10/24/19 0748 10/24/19 0748 10/25/19 0607 10/28/19 0455 10/28/19 1114 10/28/19 1115 10/29/19 0653  WBC 4.4  --  4.0 4.7  --   --  4.4  NEUTROABS  --   --   --   --   --   --  2.5  HGB 9.1*   < > 9.2* 8.7* 9.9* 9.2* 8.9*  HCT 28.7*   < > 29.6* 27.7* 29.0* 27.0* 28.2*  MCV 112.5*  --  113.8* 109.9*  --   --  111.0*  PLT 137*  --  133* 132*  --   --  130*   < > = values in this interval not displayed.   Basic Metabolic Panel: Recent Labs  Lab 10/26/19 0654 10/26/19 0654 10/27/19 0600 10/27/19 0600 10/28/19 0455 10/28/19 1114 10/28/19 1115 10/29/19 0653 10/30/19 0427  NA 136   < > 135   < > 136 139 141 135 134*  K 3.2*   < > 3.9   < > 3.5 3.6 3.5 3.6 3.7  CL 93*  --  91*  --  92*  --   --  92* 91*  CO2 31  --  32  --  32  --   --  32 33*  GLUCOSE 86  --  85  --  100*  --   --  90 89  BUN 52*  --  53*  --  44*  --   --  43* 43*  CREATININE 2.12*  --  2.26*  --  2.11*  --   --  2.14* 2.07*  CALCIUM 8.7*  --  8.8*  --  8.7*  --   --  8.6* 8.5*  MG  --   --   --   --   --   --   --  2.5*  --   PHOS 3.8  --  3.5  --   --   --   --  3.5  --    < > =  values in this interval not displayed.   GFR: Estimated Creatinine Clearance: 20 mL/min (A) (by C-G formula based on SCr of 2.07 mg/dL (H)). Liver Function Tests: Recent Labs  Lab 10/26/19 0654 10/27/19 0600 10/29/19 0653  ALBUMIN 2.4* 2.5* 2.0*   No results  for input(s): LIPASE, AMYLASE in the last 168 hours. No results for input(s): AMMONIA in the last 168 hours. Coagulation Profile: Recent Labs  Lab 10/26/19 0654 10/27/19 0600 10/28/19 0455 10/29/19 0653 10/30/19 0427  INR 3.0* 3.0* 2.8* 2.6* 2.9*   Cardiac Enzymes: No results for input(s): CKTOTAL, CKMB, CKMBINDEX, TROPONINI in the last 168 hours. BNP (last 3 results) No results for input(s): PROBNP in the last 8760 hours. HbA1C: No results for input(s): HGBA1C in the last 72 hours. CBG: Recent Labs  Lab 10/29/19 1124 10/29/19 1558 10/29/19 2112 10/30/19 0610 10/30/19 1121  GLUCAP 115* 106* 150* 78 124*   Lipid Profile: No results for input(s): CHOL, HDL, LDLCALC, TRIG, CHOLHDL, LDLDIRECT in the last 72 hours. Thyroid Function Tests: No results for input(s): TSH, T4TOTAL, FREET4, T3FREE, THYROIDAB in the last 72 hours. Anemia Panel: No results for input(s): VITAMINB12, FOLATE, FERRITIN, TIBC, IRON, RETICCTPCT in the last 72 hours. Sepsis Labs: No results for input(s): PROCALCITON, LATICACIDVEN in the last 168 hours.  Recent Results (from the past 240 hour(s))  SARS Coronavirus 2 by RT PCR (hospital order, performed in [facility] hospital lab) Nasopharyngeal Nasopharyngeal Swab     Status: None   Collection Time: 10/23/19 11:52 AM   Specimen: Nasopharyngeal Swab  Result Value Ref Range Status   SARS Coronavirus 2 NEGATIVE NEGATIVE Final    Comment: (NOTE) SARS-CoV-2 target nucleic acids are NOT DETECTED.  The SARS-CoV-2 RNA is generally detectable in upper and lower respiratory specimens during the acute phase of infection. The lowest concentration of SARS-CoV-2 viral copies this assay can detect is 250 copies / mL. A negative result does not preclude SARS-CoV-2 infection and should not be used as the sole basis for treatment or other patient management decisions.  A negative result may occur with improper specimen collection / handling, submission of specimen  other than nasopharyngeal swab, presence of viral mutation(s) within the areas targeted by this assay, and inadequate number of viral copies (<250 copies / mL). A negative result must be combined with clinical observations, patient history, and epidemiological information.  Fact Sheet for Patients:   StrictlyIdeas.no  Fact Sheet for Healthcare Providers: BankingDealers.co.za  This test is not yet approved or  cleared by the [place] FDA and has been authorized for detection and/or diagnosis of SARS-CoV-2 by FDA under an Emergency Use Authorization (EUA).  This EUA will remain in effect (meaning this test can be used) for the duration of the COVID-19 declaration under Section 564(b)(1) of the Act, 21 U.S.C. section 360bbb-3(b)(1), unless the authorization is terminated or revoked sooner.  Performed at [facility], [address] [place], [place] [address]   Gram stain     Status: None   Collection Time: 10/24/19  2:22 PM   Specimen: Ascitic; Body Fluid  Result Value Ref Range Status   Specimen Description ASCITIC  Final   Special Requests NONE  Final   Gram Stain   Final    CYTOSPIN SMEAR NO ORGANISMS SEEN WBC PRESENT, PREDOMINANTLY MONONUCLEAR Performed at [facility], [address], [place], [place] [address]    Report Status 10/24/2019 FINAL  Final  Culture, body fluid-bottle     Status: None   Collection Time: 10/24/19  2:25 PM   Specimen: Ascitic  Result Value Ref Range Status   Specimen Description ASCITIC  Final   Special Requests 10CC  Final   Culture   Final    NO GROWTH 5 DAYS Performed at [facility], [address] [place], [place] [address]    Report Status 10/29/2019 FINAL  Final     Radiology Studies: No results found.  Scheduled Meds: . fluticasone  1 spray Each Nare QHS  . furosemide  80 mg Intravenous BID  . HYDROcodone-acetaminophen  1 tablet Oral BID  . insulin aspart  0-5 Units  Subcutaneous QHS  . insulin aspart  0-6 Units Subcutaneous TID WC  . lactulose  20 g Oral Daily  . loratadine  10 mg Oral Daily  . metoprolol succinate  25 mg Oral q morning - 10a  . pantoprazole  40 mg Oral QAC breakfast  . potassium chloride  10 mEq Oral q morning - 10a  . predniSONE  40 mg Oral Q breakfast  . simvastatin  40 mg Oral Q2000  . sodium chloride flush  3 mL Intravenous Q12H  . spironolactone  50 mg Oral Daily  . vitamin B-12  500 mcg Oral Daily  . Vitamin D (Ergocalciferol)  50,000 Units Oral Q Sun  . warfarin  2 mg Oral q1600  . Warfarin - Pharmacist Dosing Inpatient   Does not apply q1600   Continuous Infusions: . sodium chloride       LOS: 7 days   Marylu Lund, MD [facility] Pager On Amion  If 7PM-7AM, please contact night-coverage 10/30/2019, 3:41 PM

## 2019-10-31 ENCOUNTER — Inpatient Hospital Stay (HOSPITAL_COMMUNITY): Payer: Medicare Other

## 2019-10-31 HISTORY — PX: IR PARACENTESIS: IMG2679

## 2019-10-31 LAB — BODY FLUID CELL COUNT WITH DIFFERENTIAL
Lymphs, Fluid: 30 %
Monocyte-Macrophage-Serous Fluid: 63 % (ref 50–90)
Neutrophil Count, Fluid: 7 % (ref 0–25)
Total Nucleated Cell Count, Fluid: 138 cu mm (ref 0–1000)

## 2019-10-31 LAB — CBC
HCT: 30.1 % — ABNORMAL LOW (ref 36.0–46.0)
Hemoglobin: 9.6 g/dL — ABNORMAL LOW (ref 12.0–15.0)
MCH: 35.2 pg — ABNORMAL HIGH (ref 26.0–34.0)
MCHC: 31.9 g/dL (ref 30.0–36.0)
MCV: 110.3 fL — ABNORMAL HIGH (ref 80.0–100.0)
Platelets: 127 10*3/uL — ABNORMAL LOW (ref 150–400)
RBC: 2.73 MIL/uL — ABNORMAL LOW (ref 3.87–5.11)
RDW: 16 % — ABNORMAL HIGH (ref 11.5–15.5)
WBC: 4.8 10*3/uL (ref 4.0–10.5)
nRBC: 0 % (ref 0.0–0.2)

## 2019-10-31 LAB — GRAM STAIN

## 2019-10-31 LAB — COMPREHENSIVE METABOLIC PANEL
ALT: 10 U/L (ref 0–44)
AST: 36 U/L (ref 15–41)
Albumin: 2.2 g/dL — ABNORMAL LOW (ref 3.5–5.0)
Alkaline Phosphatase: 63 U/L (ref 38–126)
Anion gap: 12 (ref 5–15)
BUN: 45 mg/dL — ABNORMAL HIGH (ref 8–23)
CO2: 30 mmol/L (ref 22–32)
Calcium: 8.8 mg/dL — ABNORMAL LOW (ref 8.9–10.3)
Chloride: 91 mmol/L — ABNORMAL LOW (ref 98–111)
Creatinine, Ser: 2 mg/dL — ABNORMAL HIGH (ref 0.44–1.00)
GFR calc Af Amer: 26 mL/min — ABNORMAL LOW (ref >60)
GFR calc non Af Amer: 23 mL/min — ABNORMAL LOW (ref >60)
Glucose, Bld: 141 mg/dL — ABNORMAL HIGH (ref 70–99)
Potassium: 4.2 mmol/L (ref 3.5–5.1)
Sodium: 133 mmol/L — ABNORMAL LOW (ref 135–145)
Total Bilirubin: 1.2 mg/dL (ref 0.3–1.2)
Total Protein: 8.3 g/dL — ABNORMAL HIGH (ref 6.5–8.1)

## 2019-10-31 LAB — GLUCOSE, CAPILLARY
Glucose-Capillary: 131 mg/dL — ABNORMAL HIGH (ref 70–99)
Glucose-Capillary: 132 mg/dL — ABNORMAL HIGH (ref 70–99)
Glucose-Capillary: 204 mg/dL — ABNORMAL HIGH (ref 70–99)
Glucose-Capillary: 262 mg/dL — ABNORMAL HIGH (ref 70–99)

## 2019-10-31 LAB — PROTIME-INR
INR: 2.7 — ABNORMAL HIGH (ref 0.8–1.2)
Prothrombin Time: 27.9 seconds — ABNORMAL HIGH (ref 11.4–15.2)

## 2019-10-31 MED ORDER — LIDOCAINE HCL 1 % IJ SOLN
INTRAMUSCULAR | Status: AC
  Filled 2019-10-31: qty 20

## 2019-10-31 MED ORDER — PREDNISONE 20 MG PO TABS
40.0000 mg | ORAL_TABLET | Freq: Every day | ORAL | Status: DC
  Administered 2019-10-31 – 2019-11-02 (×3): 40 mg via ORAL
  Filled 2019-10-31 (×3): qty 2

## 2019-10-31 MED ORDER — LIDOCAINE HCL 1 % IJ SOLN
INTRAMUSCULAR | Status: DC | PRN
  Administered 2019-10-31: 10 mL

## 2019-10-31 NOTE — Plan of Care (Signed)
  Problem: Education: Goal: Knowledge of General Education information will improve Description Including pain rating scale, medication(s)/side effects and non-pharmacologic comfort measures Outcome: Progressing   

## 2019-10-31 NOTE — Progress Notes (Signed)
ANTICOAGULATION CONSULT NOTE - Follow-Up  Pharmacy Consult for warfarin Indication: mechanical AVR and afib  Allergies  Allergen Reactions  . Tape Itching    Redness, Please use "paper" tape only    Patient Measurements: Height: 5\' 6"  (167.6 cm) Weight: 61.7 kg (136 lb 0.4 oz) IBW/kg (Calculated) : 59.3  Vital Signs: Temp: 97.5 F (36.4 C) (08/13 0408) Temp Source: Oral (08/13 0408) BP: 104/49 (08/13 0845) Pulse Rate: 72 (08/13 0845)  Labs: Recent Labs    10/28/19 1115 10/28/19 1115 10/29/19 0653 10/30/19 0427 10/31/19 0427  HGB 9.2*   < > 8.9*  --  9.6*  HCT 27.0*  --  28.2*  --  30.1*  PLT  --   --  130*  --  127*  LABPROT  --   --  26.8* 29.5* 27.9*  INR  --   --  2.6* 2.9* 2.7*  CREATININE  --   --  2.14* 2.07* 2.00*   < > = values in this interval not displayed.    Estimated Creatinine Clearance: 20.7 mL/min (A) (by C-G formula based on SCr of 2 mg/dL (H)).   Medical History: Past Medical History:  Diagnosis Date  . Anxiety   . Diabetes mellitus with neuropathy ([place])   . Diabetic neuropathy ([place])   . DJD (degenerative joint disease)   . H/O aortic valve replacement   . Hyperlipidemia   . Hypertension   . Neuromuscular disorder (HCC)    neuropathy in feet  . Obesity   . Sleep apnea   . Stroke [facility])    " light stroke"  . Wears glasses     Assessment: Pharmacy consulted to dose warfarin for this patient with a history of mechanical AVR and afib.  She takes 2 mg daily at home.  INR has been therapeutic on this regimen.  Warfarin dose held 8/9 for cardiac cath 8/10.  Cath complete, ok to resume warfarin.  Increased dose given last night to cover prior held dose.  INR today remains therapeutic at 2.7. NO significant drug interactions noted.   Goal of Therapy:  INR 2.5-3.5 Monitor platelets by anticoagulation protocol: Yes   Plan:  Continue warfarin 2 mg daily  Monitor daily INR for warfarin dose adjustments, CBC, s/s bleeding  Sloan Leiter,  PharmD, BCPS, BCCCP Clinical Pharmacist Please refer to [facility] for [place] numbers 10/31/2019, 11:07 AM

## 2019-10-31 NOTE — Progress Notes (Signed)
Progress Note  Patient Name: Mary [name] Date of Encounter: 10/31/2019  Primary Cardiologist: Carlyle Dolly, MD   Subjective   Diuresed another 1.7L negative - ascites however, appears to be worsening on exam. Weight is up almost 2KG. Potassium is rising - now 4.2 on aldactone, may not be able to increase dose further.  Inpatient Medications    Scheduled Meds:  fluticasone  1 spray Each Nare QHS   furosemide  80 mg Intravenous BID   HYDROcodone-acetaminophen  1 tablet Oral BID   insulin aspart  0-5 Units Subcutaneous QHS   insulin aspart  0-6 Units Subcutaneous TID WC   lactulose  20 g Oral Daily   loratadine  10 mg Oral Daily   metoprolol succinate  25 mg Oral q morning - 10a   pantoprazole  40 mg Oral QAC breakfast   potassium chloride  10 mEq Oral q morning - 10a   predniSONE  40 mg Oral Q breakfast   simvastatin  40 mg Oral Q2000   sodium chloride flush  3 mL Intravenous Q12H   spironolactone  50 mg Oral Daily   vitamin B-12  500 mcg Oral Daily   Vitamin D (Ergocalciferol)  50,000 Units Oral Q Sun   warfarin  2 mg Oral q1600   Warfarin - Pharmacist Dosing Inpatient   Does not apply q1600   Continuous Infusions:  sodium chloride     PRN Meds: sodium chloride, acetaminophen, albuterol, ALPRAZolam, bisacodyl, ondansetron **OR** ondansetron (ZOFRAN) IV, sodium chloride flush, traZODone   Vital Signs    Vitals:   10/31/19 0102 10/31/19 0408 10/31/19 0413 10/31/19 0845  BP:  (!) 104/54  (!) 104/49  Pulse:  67  72  Resp:  17  18  Temp:  (!) 97.5 F (36.4 C)    TempSrc:  Oral    SpO2: 100% 100%  100%  Weight:   61.7 kg   Height:        Intake/Output Summary (Last 24 hours) at 10/31/2019 0908 Last data filed at 10/31/2019 0845 Gross per 24 hour  Intake 720 ml  Output 2350 ml  Net -1630 ml    Last 3 Weights 10/31/2019 10/30/2019 10/29/2019  Weight (lbs) 136 lb 0.4 oz 132 lb 0.9 oz 135 lb 5.8 oz  Weight (kg) 61.7 kg 59.9 kg 61.4 kg       Telemetry    Afib, rate controlled - Personally Reviewed  ECG    N/A - Personally Reviewed  Physical Exam   General: frail, elderly, female in no acute distress Head: Eyes PERRLA, Head normocephalic and atraumatic Lungs: rales bases bilaterally to auscultation. Heart: slightly irregular R&R, S1 S2, without rub or gallop. 2/6 murmur, crisp valve click. 4/4 extremity pulses are 2+ & equal. JVP now 5 cm Abdomen: Bowel sounds are present, distended abdomen with +fluid wave suggesting moderate ascites Msk: weak strength and tone for age. Extremities: No clubbing, cyanosis or edema.    Skin:  No rashes or lesions noted. Neuro: Alert and oriented X 3. Psych:  Good affect, responds appropriately, gets anxious easily, anxious about the procedure  Labs    Chemistry Recent Labs  Lab 10/27/19 0600 10/28/19 0455 10/29/19 0653 10/30/19 0427 10/31/19 0427  NA 135   < > 135 134* 133*  K 3.9   < > 3.6 3.7 4.2  CL 91*   < > 92* 91* 91*  CO2 32   < > 32 33* 30  GLUCOSE 85   < >  90 89 141*  BUN 53*   < > 43* 43* 45*  CREATININE 2.26*   < > 2.14* 2.07* 2.00*  CALCIUM 8.8*   < > 8.6* 8.5* 8.8*  PROT  --   --   --   --  8.3*  ALBUMIN 2.5*  --  2.0*  --  2.2*  AST  --   --   --   --  36  ALT  --   --   --   --  10  ALKPHOS  --   --   --   --  63  BILITOT  --   --   --   --  1.2  GFRNONAA 20*   < > 21* 22* 23*  GFRAA 23*   < > 24* 25* 26*  ANIONGAP 12   < > 11 10 12    < > = values in this interval not displayed.    Lab Results  Component Value Date   ALT 10 10/31/2019   AST 36 10/31/2019   ALKPHOS 63 10/31/2019   BILITOT 1.2 10/31/2019   Hematology Recent Labs  Lab 10/28/19 0455 10/28/19 1114 10/28/19 1115 10/29/19 0653 10/31/19 0427  WBC 4.7  --   --  4.4 4.8  RBC 2.52*  --   --  2.54* 2.73*  HGB 8.7*   < > 9.2* 8.9* 9.6*  HCT 27.7*   < > 27.0* 28.2* 30.1*  MCV 109.9*  --   --  111.0* 110.3*  MCH 34.5*  --   --  35.0* 35.2*  MCHC 31.4  --   --  31.6 31.9  RDW 16.5*   --   --  16.5* 16.0*  PLT 132*  --   --  130* 127*   < > = values in this interval not displayed.    Cardiac Enzymes High Sensitivity Troponin:  No results for input(s): TROPONINIHS in the last 720 hours.     BNP No results for input(s): BNP, PROBNP in the last 168 hours.  Lab Results  Component Value Date   INR 2.7 (H) 10/31/2019   INR 2.9 (H) 10/30/2019   INR 2.6 (H) 10/29/2019   Ammonia  Date Value Ref Range Status  10/23/2019 53 (H) 9 - 35 umol/L Final    Comment:    Performed at [facility], [address] [place],  [address]    Radiology    US Abdomen Complete  Result Date: 10/24/2019 CLINICAL DATA:  Abdominal distension EXAM: ABDOMEN ULTRASOUND COMPLETE COMPARISON:  03/27/2016 FINDINGS: Gallbladder: Gallbladder is well distended without gallbladder wall thickening. Cholelithiasis and gallbladder sludge is seen. Common bile duct: Diameter: 3.1 mm. Liver: Liver is shrunken with a nodular appearance. No focal mass lesion is noted. Stable cyst is noted in the left lobe of the liver measuring 1.8 cm in greatest dimension. Portal vein is patent on color Doppler imaging with normal direction of blood flow towards the liver. IVC: No abnormality visualized. Pancreas: Visualized portion unremarkable. Spleen: Size and appearance within normal limits. Right Kidney: Length: 7.9 cm. Slight increased echogenicity is noted. Scarring is also noted in the upper pole of the right kidney. No obstructive changes are seen. Left Kidney: Length: 9.4 cm. Echogenicity within normal limits. No mass or hydronephrosis visualized. Abdominal aorta: No aneurysm visualized. Other findings: Ascites is noted. Additionally a right-sided pleural effusion is noted. IMPRESSION: Changes consistent with cirrhosis of the liver with associated ascites. Right-sided pleural effusion. Left hepatic cyst stable from the  prior study. Cholelithiasis and gallbladder sludge without complicating factors. Electronically Signed    By: Inez Catalina M.D.   On: 10/24/2019 09:02   DG Chest Portable 1 View  Result Date: 10/23/2019 CLINICAL DATA:  Evaluate for pulmonary edema EXAM: PORTABLE CHEST 1 VIEW COMPARISON:  10/14/2019 FINDINGS: Cardiomegaly. Prior median sternotomy. Moderate right pleural effusion with right lower lobe atelectasis. Areas of scarring in the lungs bilaterally. No overt edema. No acute bony abnormality. IMPRESSION: Stable moderate right effusion with right base atelectasis. Cardiomegaly. Chronic changes.  No overt edema. Electronically Signed   By: Rolm Baptise M.D.   On: 10/23/2019 11:00   ECHOCARDIOGRAM COMPLETE  Result Date: 10/23/2019    ECHOCARDIOGRAM REPORT   Patient Name:   Mary [name] Date of Exam: 10/23/2019 Medical Rec #:  [id number]   Height:       66.0 in Accession #:    [id number]  Weight:       160.2 lb Date of Birth:  1941/01/14   BSA:          1.820 m Patient Age:    68 years    BP:           119/65 mmHg Patient Gender: F           HR:           71 bpm. Exam Location:  [facility] Procedure: 2D Echo Indications:    Dyspnea 786.09 / R06.00  History:        Patient has prior history of Echocardiogram examinations, most                 recent 04/24/2019. Stroke, Arrythmias:Atrial Fibrillation; Risk                 Factors:Non-Smoker, Dyslipidemia, Diabetes and Hypertension.                 CKD.  Sonographer:    Leavy Cella RDCS (AE) Referring Phys: [id number] [place]  1. Left ventricular ejection fraction, by estimation, is 55 to 60%. The left ventricle has normal function. The left ventricle has no regional wall motion abnormalities. Left ventricular diastolic parameters are indeterminate.  2. Right ventricular systolic function is severely reduced. The right ventricular size is severely enlarged. There is moderately elevated pulmonary artery systolic pressure.  3. Left atrial size was moderately dilated.  4. Right atrial size was severely dilated.  5. The mitral valve is normal in  structure. Mild mitral valve regurgitation. No evidence of mitral stenosis.  6. The tricuspid valve is abnormal. Tricuspid valve regurgitation is severe.  7. There is a mechanical valve in the AV position. From medical record St Jude valve, unknown type and size. . The aortic valve has been repaired/replaced. Aortic valve regurgitation is not visualized. No aortic stenosis is present.  8. Severe pulmonary HTN, PASP is 65 mmHg.  9. The inferior vena cava is dilated in size with <50% respiratory variability, suggesting right atrial pressure of 15 mmHg. FINDINGS  Left Ventricle: Left ventricular ejection fraction, by estimation, is 55 to 60%. The left ventricle has normal function. The left ventricle has no regional wall motion abnormalities. The left ventricular internal cavity size was small. There is no left ventricular hypertrophy. Left ventricular diastolic parameters are indeterminate. Right Ventricle: The right ventricular size is severely enlarged. No increase in right ventricular wall thickness. Right ventricular systolic function is severely reduced. There is moderately elevated pulmonary artery systolic pressure. The tricuspid regurgitant velocity  is 3.46 m/s, and with an assumed right atrial pressure of 10 mmHg, the estimated right ventricular systolic pressure is 24.5 mmHg. Left Atrium: Left atrial size was moderately dilated. Right Atrium: Right atrial size was severely dilated. Pericardium: There is no evidence of pericardial effusion. Mitral Valve: The mitral valve is normal in structure. There is mild thickening of the mitral valve leaflet(s). There is mild calcification of the mitral valve leaflet(s). Mild mitral annular calcification. Mild mitral valve regurgitation. No evidence of  mitral valve stenosis. Tricuspid Valve: The tricuspid valve is abnormal. Tricuspid valve regurgitation is severe. No evidence of tricuspid stenosis. Aortic Valve: There is a mechanical valve in the AV position. From  medical record St Jude valve, unknown type and size. The aortic valve has been repaired/replaced. Aortic valve regurgitation is not visualized. No aortic stenosis is present. Aortic valve  mean gradient measures 3.8 mmHg. Aortic valve peak gradient measures 6.4 mmHg. Pulmonic Valve: The pulmonic valve was not well visualized. Pulmonic valve regurgitation is mild to moderate. No evidence of pulmonic stenosis. Aorta: The aortic root is normal in size and structure. Pulmonary Artery: Severe pulmonary HTN, PASP is 65 mmHg. Venous: The inferior vena cava is dilated in size with less than 50% respiratory variability, suggesting right atrial pressure of 15 mmHg. IAS/Shunts: The interatrial septum was not well visualized.  LEFT VENTRICLE PLAX 2D LVIDd:         4.01 cm Diastology LVIDs:         3.50 cm LV e' lateral:   7.97 cm/s LV PW:         1.13 cm LV E/e' lateral: 14.1 LV IVS:        0.70 cm LV e' medial:    7.31 cm/s                        LV E/e' medial:  15.3  RIGHT VENTRICLE RV S prime:     4.05 cm/s TAPSE (M-mode): 0.8 cm LEFT ATRIUM             Index       RIGHT ATRIUM           Index LA diam:        3.60 cm 1.98 cm/m  RA Area:     18.80 cm LA Vol (A2C):   69.0 ml 37.92 ml/m RA Volume:   54.60 ml  30.00 ml/m LA Vol (A4C):   59.8 ml 32.86 ml/m LA Biplane Vol: 64.9 ml 35.66 ml/m  AORTIC VALVE AV Vmax:           126.67 cm/s AV Vmean:          93.430 cm/s AV VTI:            0.268 m AV Peak Grad:      6.4 mmHg AV Mean Grad:      3.8 mmHg LVOT Vmax:         64.97 cm/s LVOT Vmean:        44.995 cm/s LVOT VTI:          0.146 m LVOT/AV VTI ratio: 0.55  AORTA Ao Root diam: 2.30 cm MITRAL VALVE                TRICUSPID VALVE MV Area (PHT): 3.33 cm     TR Peak grad:   47.9 mmHg MV Decel Time: 228 msec     TR Vmax:        346.00 cm/s  MV E velocity: 112.00 cm/s                             SHUNTS                             Systemic VTI: 0.15 m Carlyle Dolly MD Electronically signed by Carlyle Dolly MD Signature  Date/Time: 10/23/2019/3:56:43 PM    Final     Cardiac Studies   Echocardiogram: 10/23/2019 IMPRESSIONS    1. Left ventricular ejection fraction, by estimation, is 55 to 60%. The  left ventricle has normal function. The left ventricle has no regional  wall motion abnormalities. Left ventricular diastolic parameters are  indeterminate.  2. Right ventricular systolic function is severely reduced. The right  ventricular size is severely enlarged. There is moderately elevated  pulmonary artery systolic pressure.  3. Left atrial size was moderately dilated.  4. Right atrial size was severely dilated.  5. The mitral valve is normal in structure. Mild mitral valve  regurgitation. No evidence of mitral stenosis.  6. The tricuspid valve is abnormal. Tricuspid valve regurgitation is  severe.  7. There is a mechanical valve in the AV position. From medical record St  Jude valve, unknown type and size. . The aortic valve has been  repaired/replaced. Aortic valve regurgitation is not visualized. No aortic  stenosis is present.  8. Severe pulmonary HTN, PASP is 65 mmHg.  9. The inferior vena cava is dilated in size with <50% respiratory  variability, suggesting right atrial pressure of 15 mmHg.   Patient Profile     79 y.o. female w/ PMH of chronic diastolic CHF, persistent atrial fibrillation, mechanical AVR in 1995, HTN, HLD, and OSA who was sent to the Emergency Department from the office on 10/23/2019 for an acute CHF exacerbation and further evaluation of her cirrhosis.   Assessment & Plan    1. Acute on Chronic Diastolic CHF/ R sided CHF  - ?anasarca or cor pulmonale, w/ RV dysfunction and pulm HTN on echo (nl 2017) - for R heart cath today, all questions answered to their satisfaction - s/p paracentesis 08/06 w/ 4.2 L off>>repeat ultrasound suggests fluid reaccumulation, may need repeat paracentesis. - Continue IV diuresis - aldactone added yesterday, small increase in potassium-  monitor  2. Persistent Atrial Fibrillation - controlled rate  3. Aortic Valve Replacement - mech AVR 1995, functioning well on echo  - coumadin therapeutic  4. Pulmonary HTN - PASP 65 mmHg by echo 08/05, improved significantly with diuresis by RHC to mild, suggests pulmonary venous HTN - diurese  5. Cirrhosis with ascties - cirrhosis and recurrent ascites seen on repeat US - LFTs ok except alb 2.5, T bili 1.5 - ammonia 53 on 08/05 - on Lactulose - ? TIPS evaluation -Now on aldactone 50 mg daily - monitor for hyperkalemia, consider increase to 100 mg daily given improved renal function - would aim for an aldactone:furosemide ratio of 100:120. - ascites is worse today - will likely need repeat paracentesis.  6. AKI - Creatinine continues to improve with diuresis, now 2.0 today  Pixie Casino, MD, [facility], [place] Director of the [facility] Diplomate of the American Board of Clinical Lipidology Attending Cardiologist  Direct Dial: [phone]   Fax: [phone]  Website:  [email or web address]  Nadean Corwin Yohana Bartha 9:08 AM  10/31/2019 ° ° °

## 2019-10-31 NOTE — Procedures (Signed)
Ultrasound-guided diagnostic and therapeutic paracentesis performed yielding 4.2 liters of straw colored colored fluid.  Fluid was sent to lab for analysis. No immediate complications. EBL is none.

## 2019-10-31 NOTE — Progress Notes (Signed)
RT placed pt on CPAP dream station on auto titrate w/ no oxygen bled into the system. Pt respiratory status is stable at this time w/no distress noted. RT will continue to monitor.

## 2019-10-31 NOTE — TOC Initial Note (Signed)
Transition of Care [facility]) - Initial/Assessment Note    Patient Details  Name: Mary [name] MRN: [id number] Date of Birth: August 28, 1940  Transition of [facility]) CM/SW Contact:    Jacquelynn Cree Phone Number: 10/31/2019, 9:45 AM  Clinical Narrative:                 CSW met with patient and patient's husband Kela Millin bedside to discuss PT recommendation for short-term SNF placement at discharge. The family lives in a mobile home with a ramped entry. Prior to arrival, patient was ambulating with the use of a cane.  Patient and spouse expressed they would like patient to return home with home health. Patient was previously active with [facility] until about 3 months ago. Patient has a rolling walker, cane, and bedside commode. Spouse stated they also have hand rails in the bathroom and areas of the home. Patient and spouse are open to using [facility] again.  Expected Discharge Plan: [place] Barriers to Discharge: Continued Medical Work up   Patient Goals and CMS Choice Patient states their goals for this hospitalization and ongoing recovery are:: to go back home CMS Medicare.gov Compare Post Acute Care list provided to:: Patient Choice offered to / list presented to : Patient  Expected Discharge Plan and Services Expected Discharge Plan: [place] In-house Referral: Clinical Social Work   Post Acute Care Choice: [place] arrangements for the past 2 months: Mobile Home                                      Prior Living Arrangements/Services Living arrangements for the past 2 months: Mobile Home Lives with:: Spouse          Need for Family Participation in Patient Care: Yes (Comment) Care giver support system in place?: Yes (comment)   Criminal Activity/Legal Involvement Pertinent to Current Situation/Hospitalization: No - Comment as needed  Activities of Daily Living Home Assistive Devices/Equipment: Cane  (specify quad or straight), Walker (specify type), CPAP ADL Screening (condition at time of admission) Patient's cognitive ability adequate to safely complete daily activities?: Yes Is the patient deaf or have difficulty hearing?: No Does the patient have difficulty seeing, even when wearing glasses/contacts?: No Does the patient have difficulty concentrating, remembering, or making decisions?: No Patient able to express need for assistance with ADLs?: Yes Does the patient have difficulty dressing or bathing?: No Independently performs ADLs?: Yes (appropriate for developmental age) Does the patient have difficulty walking or climbing stairs?: Yes Weakness of Legs: Both Weakness of Arms/Hands: None  Permission Sought/Granted Permission sought to share information with : Family Supports Permission granted to share information with : Yes, Verbal Permission Granted  Share Information with NAME: Kela Millin  Permission granted to share info w AGENCY: [facility]  Permission granted to share info w Relationship: Spouse  Permission granted to share info w Contact Information: [phone]  Emotional Assessment Appearance:: Appears stated age Attitude/Demeanor/Rapport: Engaged Affect (typically observed): Appropriate Orientation: : Oriented to Place, Oriented to Self, Oriented to  Time, Oriented to Situation Alcohol / Substance Use: Not Applicable Psych Involvement: No (comment)  Admission diagnosis:  Abdominal distension [R14.0] Acute exacerbation of CHF (congestive heart failure) ([place]) [I50.9] Acute combined systolic and diastolic congestive heart failure ([place]) [I50.41] Patient Active Problem List   Diagnosis Date Noted  . Other secondary pulmonary hypertension ([place]) 10/28/2019  .  Acute on chronic combined systolic and diastolic CHF (congestive heart failure) ([place])   . Anasarca 04/25/2019  . Hypoalbuminemia 04/25/2019  . Persistent atrial fibrillation ([place]) 04/25/2019  . CKD (chronic kidney  disease), stage III 04/25/2019  . Anemia due to chronic kidney disease 04/25/2019  . Pressure injury of skin 04/24/2019  . Fall 04/23/2019  . S/P right knee arthroscopy 04/13/2015  . OSA (obstructive sleep apnea) 01/01/2013  . Diabetes ([place]) 10/23/2012  . Aortic valve disorder 06/30/2010  . CVA (cerebral vascular accident) ([place]) 06/30/2010  . S/P aortic valve replacement 06/30/2010  . Hyperlipidemia 09/29/2008  . Obesity 09/29/2008  . Essential hypertension 09/29/2008  . DEGENERATIVE JOINT DISEASE 09/29/2008   PCP:  [name], MD Pharmacy:   [facility] [address], [name] - [place] [facility] [address] [place] [address] Phone: [phone] Fax: [phone]     Social Determinants of Health (SDOH) Interventions    Readmission Risk Interventions No flowsheet data found.

## 2019-10-31 NOTE — Progress Notes (Signed)
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,paroxysmal atrial fibrillation, history of prior stroke, history of DM, CKD 3b, OSA admitted on 10/23/2019 with acute on chronic diastolic dysfunction CHF exacerbation and ascites secondary to liver cirrhosis as well as AKI having failed outpatient attempt at oral diuresis  Assessment & Plan:   Principal Problem:   Acute on chronic combined systolic and diastolic CHF (congestive heart failure) (HCC) Active Problems:   Essential hypertension   CVA (cerebral vascular accident) ([place])   S/P aortic valve replacement   Diabetes (HCC)   OSA (obstructive sleep apnea)   CKD (chronic kidney disease), stage III   Other secondary pulmonary hypertension (HCC)   Acute on Chronic HFpEF Hx of pulmonary HTN and RV dysfunction     - 04/2019 echo LVEF 60-65%, elevated LA pressure, moderate RV dysfunction, severe RVE, severe biatrial enlargement, mod MR, severe TR, severe pulm HTN     - Repeat echo on 10/23/2019 with EF down to 55 to 60%, with severe pulmonary hypertension PASP of 65     - failed outpt: torsemide 80mg  bid, metolazone 10mg as needed (her SCr was trending up and she was developing ascites)     -Per cardiology, pt is continuing on scheduled IV lasix     - cardiology following, s/p RHC 8/10 -abd is nontender but is distended and ascitic with fluid wave. Discussed with Cardiology. Will plan for US guided paracentesis. Pt is s/p recent large volume paracentessi  S/p aortic valve replacement with Saint Jude AVR in 1995/1996     - coumadin held for RHC; INR 2.6 today     - continue coumadin dosing per pharmacy -Seems to be stable at this time  OSA     - history of noncompliance with CPAP -Seems stable at this  time  Hx of paroxysmal A. fib and prior CVA     - on Coumadin, Toprol-XL; follow BP/HR -Stable currently  Hx of cirrhosis on prior imaging studies     - doesn't appear to have had work-up for cirrhosis     - abdominal ultrasound consistent with liver cirrhosis with ascites      - ammonia 53; on lactulose, mentation ok; folow     - s/p paracentesis on 10/24/2018 for removal of 4.2 L: fluid studies negative     - Viral hepatitis profile negative, TSH normal, LDL 27     -Continue to follow LFTs  AKI on CKD3b     - SCr on admission was 2.20, baseline creatinine =1.4      - SCr is 2.07 today; watch nephrotoxins, follow -repeat bmet in AM  Chronic macrocytic anemia     - baseline Hgb is about 9     - hgb trends reviewed, stable thus far, noted to be 8.9 on most recent check     - B12 supplement  DMt2     - SSI, DM diet, CBG -glycemic trends reviewed, remains stable at this time   DVT prophylaxis: Warfarin Code Status: Full Family Communication: Pt in room, family currently at bedside  Status is: Inpatient  Remains inpatient appropriate because:Unsafe d/c plan, IV treatments appropriate due to intensity of illness or inability to take PO and Inpatient level of care appropriate due to severity of illness   Dispo: The patient  is from: Home              Anticipated d/c is to: Home              Anticipated d/c date is: 3 days              Patient currently is not medically stable to d/c.    Consultants:   Cardiology  Procedures:   RHC  Antimicrobials: Anti-infectives (From admission, onward)   None      Subjective: Without complaints this AM. In good spirits  Objective: Vitals:   10/31/19 0408 10/31/19 0413 10/31/19 0845 10/31/19 1112  BP: (!) 104/54  (!) 104/49 115/65  Pulse: 67  72 70  Resp: 17  18 16   Temp: (!) 97.5 F (36.4 C)   98 F (36.7 C)  TempSrc: Oral   Oral  SpO2: 100%  100% 96%  Weight:  61.7 kg    Height:        Intake/Output  Summary (Last 24 hours) at 10/31/2019 1448 Last data filed at 10/31/2019 1300 Gross per 24 hour  Intake 700 ml  Output 3300 ml  Net -2600 ml   Filed Weights   10/29/19 0031 10/30/19 0456 10/31/19 0413  Weight: 61.4 kg 59.9 kg 61.7 kg    Examination: General exam: Conversant, in no acute distress Respiratory system: normal chest rise, clear, no audible wheezing Cardiovascular system: regular rhythm, s1-s2 Gastrointestinal system: distended, ascitic, nontender Central nervous system: No seizures, no tremors Extremities: No cyanosis, no joint deformities Skin: No rashes, no pallor Psychiatry: Affect normal // no auditory hallucinations   Data Reviewed: I have personally reviewed following labs and imaging studies  CBC: Recent Labs  Lab 10/25/19 0607 10/25/19 0607 10/28/19 0455 10/28/19 1114 10/28/19 1115 10/29/19 0653 10/31/19 0427  WBC 4.0  --  4.7  --   --  4.4 4.8  NEUTROABS  --   --   --   --   --  2.5  --   HGB 9.2*   < > 8.7* 9.9* 9.2* 8.9* 9.6*  HCT 29.6*   < > 27.7* 29.0* 27.0* 28.2* 30.1*  MCV 113.8*  --  109.9*  --   --  111.0* 110.3*  PLT 133*  --  132*  --   --  130* 127*   < > = values in this interval not displayed.   Basic Metabolic Panel: Recent Labs  Lab 10/26/19 0654 10/26/19 0654 10/27/19 0600 10/27/19 0600 10/28/19 0455 10/28/19 0455 10/28/19 1114 10/28/19 1115 10/29/19 0653 10/30/19 0427 10/31/19 0427  NA 136   < > 135   < > 136   < > 139 141 135 134* 133*  K 3.2*   < > 3.9   < > 3.5   < > 3.6 3.5 3.6 3.7 4.2  CL 93*   < > 91*  --  92*  --   --   --  92* 91* 91*  CO2 31   < > 32  --  32  --   --   --  32 33* 30  GLUCOSE 86   < > 85  --  100*  --   --   --  90 89 141*  BUN 52*   < > 53*  --  44*  --   --   --  43* 43* 45*  CREATININE 2.12*   < > 2.26*  --  2.11*  --   --   --  2.14* 2.07* 2.00*  CALCIUM 8.7*   < > 8.8*  --  8.7*  --   --   --  8.6* 8.5* 8.8*  MG  --   --   --   --   --   --   --   --  2.5*  --   --   PHOS 3.8  --  3.5   --   --   --   --   --  3.5  --   --    < > = values in this interval not displayed.   GFR: Estimated Creatinine Clearance: 20.7 mL/min (A) (by C-G formula based on SCr of 2 mg/dL (H)). Liver Function Tests: Recent Labs  Lab 10/26/19 0654 10/27/19 0600 10/29/19 0653 10/31/19 0427  AST  --   --   --  36  ALT  --   --   --  10  ALKPHOS  --   --   --  63  BILITOT  --   --   --  1.2  PROT  --   --   --  8.3*  ALBUMIN 2.4* 2.5* 2.0* 2.2*   No results for input(s): LIPASE, AMYLASE in the last 168 hours. No results for input(s): AMMONIA in the last 168 hours. Coagulation Profile: Recent Labs  Lab 10/27/19 0600 10/28/19 0455 10/29/19 0653 10/30/19 0427 10/31/19 0427  INR 3.0* 2.8* 2.6* 2.9* 2.7*   Cardiac Enzymes: No results for input(s): CKTOTAL, CKMB, CKMBINDEX, TROPONINI in the last 168 hours. BNP (last 3 results) No results for input(s): PROBNP in the last 8760 hours. HbA1C: No results for input(s): HGBA1C in the last 72 hours. CBG: Recent Labs  Lab 10/30/19 1616 10/30/19 2123 10/31/19 0610 10/31/19 0634 10/31/19 1114  GLUCAP 122* 239* 131* 132* 204*   Lipid Profile: No results for input(s): CHOL, HDL, LDLCALC, TRIG, CHOLHDL, LDLDIRECT in the last 72 hours. Thyroid Function Tests: No results for input(s): TSH, T4TOTAL, FREET4, T3FREE, THYROIDAB in the last 72 hours. Anemia Panel: No results for input(s): VITAMINB12, FOLATE, FERRITIN, TIBC, IRON, RETICCTPCT in the last 72 hours. Sepsis Labs: No results for input(s): PROCALCITON, LATICACIDVEN in the last 168 hours.  Recent Results (from the past 240 hour(s))  SARS Coronavirus 2 by RT PCR (hospital order, performed in [facility] hospital lab) Nasopharyngeal Nasopharyngeal Swab     Status: None   Collection Time: 10/23/19 11:52 AM   Specimen: Nasopharyngeal Swab  Result Value Ref Range Status   SARS Coronavirus 2 NEGATIVE NEGATIVE Final    Comment: (NOTE) SARS-CoV-2 target nucleic acids are NOT DETECTED.  The  SARS-CoV-2 RNA is generally detectable in upper and lower respiratory specimens during the acute phase of infection. The lowest concentration of SARS-CoV-2 viral copies this assay can detect is 250 copies / mL. A negative result does not preclude SARS-CoV-2 infection and should not be used as the sole basis for treatment or other patient management decisions.  A negative result may occur with improper specimen collection / handling, submission of specimen other than nasopharyngeal swab, presence of viral mutation(s) within the areas targeted by this assay, and inadequate number of viral copies (<250 copies / mL). A negative result must be combined with clinical observations, patient history, and epidemiological information.  Fact Sheet for Patients:   StrictlyIdeas.no  Fact Sheet for Healthcare Providers: BankingDealers.co.za  This test is not yet approved or  cleared by the [place] FDA and has been authorized for detection and/or diagnosis of  SARS-CoV-2 by FDA under an Emergency Use Authorization (EUA).  This EUA will remain in effect (meaning this test can be used) for the duration of the COVID-19 declaration under Section 564(b)(1) of the Act, 21 U.S.C. section 360bbb-3(b)(1), unless the authorization is terminated or revoked sooner.  Performed at [facility], [address], [place], [place] [address]   Gram stain     Status: None   Collection Time: 10/24/19  2:22 PM   Specimen: Ascitic; Body Fluid  Result Value Ref Range Status   Specimen Description ASCITIC  Final   Special Requests NONE  Final   Gram Stain   Final    CYTOSPIN SMEAR NO ORGANISMS SEEN WBC PRESENT, PREDOMINANTLY MONONUCLEAR Performed at [facility], [address], [place], [place] [address]    Report Status 10/24/2019 FINAL  Final  Culture, body fluid-bottle     Status: None   Collection Time: 10/24/19  2:25 PM   Specimen: Ascitic  Result Value Ref  Range Status   Specimen Description ASCITIC  Final   Special Requests 10CC  Final   Culture   Final    NO GROWTH 5 DAYS Performed at [facility], [address], [place], [place] [address]    Report Status 10/29/2019 FINAL  Final     Radiology Studies: No results found.  Scheduled Meds: . fluticasone  1 spray Each Nare QHS  . furosemide  80 mg Intravenous BID  . HYDROcodone-acetaminophen  1 tablet Oral BID  . insulin aspart  0-5 Units Subcutaneous QHS  . insulin aspart  0-6 Units Subcutaneous TID WC  . lactulose  20 g Oral Daily  . loratadine  10 mg Oral Daily  . metoprolol succinate  25 mg Oral q morning - 10a  . pantoprazole  40 mg Oral QAC breakfast  . potassium chloride  10 mEq Oral q morning - 10a  . predniSONE  40 mg Oral Q breakfast  . simvastatin  40 mg Oral Q2000  . sodium chloride flush  3 mL Intravenous Q12H  . spironolactone  50 mg Oral Daily  . vitamin B-12  500 mcg Oral Daily  . Vitamin D (Ergocalciferol)  50,000 Units Oral Q Sun  . warfarin  2 mg Oral q1600  . Warfarin - Pharmacist Dosing Inpatient   Does not apply q1600   Continuous Infusions: . sodium chloride       LOS: 8 days   Marylu Lund, MD [facility] Pager On Amion  If 7PM-7AM, please contact night-coverage 10/31/2019, 2:48 PM

## 2019-11-01 LAB — CBC
HCT: 31.8 % — ABNORMAL LOW (ref 36.0–46.0)
Hemoglobin: 10.3 g/dL — ABNORMAL LOW (ref 12.0–15.0)
MCH: 35.4 pg — ABNORMAL HIGH (ref 26.0–34.0)
MCHC: 32.4 g/dL (ref 30.0–36.0)
MCV: 109.3 fL — ABNORMAL HIGH (ref 80.0–100.0)
Platelets: 148 10*3/uL — ABNORMAL LOW (ref 150–400)
RBC: 2.91 MIL/uL — ABNORMAL LOW (ref 3.87–5.11)
RDW: 15.9 % — ABNORMAL HIGH (ref 11.5–15.5)
WBC: 7.8 10*3/uL (ref 4.0–10.5)
nRBC: 0 % (ref 0.0–0.2)

## 2019-11-01 LAB — COMPREHENSIVE METABOLIC PANEL
ALT: 10 U/L (ref 0–44)
AST: 31 U/L (ref 15–41)
Albumin: 2.3 g/dL — ABNORMAL LOW (ref 3.5–5.0)
Alkaline Phosphatase: 62 U/L (ref 38–126)
Anion gap: 11 (ref 5–15)
BUN: 45 mg/dL — ABNORMAL HIGH (ref 8–23)
CO2: 31 mmol/L (ref 22–32)
Calcium: 8.8 mg/dL — ABNORMAL LOW (ref 8.9–10.3)
Chloride: 91 mmol/L — ABNORMAL LOW (ref 98–111)
Creatinine, Ser: 1.78 mg/dL — ABNORMAL HIGH (ref 0.44–1.00)
GFR calc Af Amer: 30 mL/min — ABNORMAL LOW (ref >60)
GFR calc non Af Amer: 26 mL/min — ABNORMAL LOW (ref >60)
Glucose, Bld: 86 mg/dL (ref 70–99)
Potassium: 3.8 mmol/L (ref 3.5–5.1)
Sodium: 133 mmol/L — ABNORMAL LOW (ref 135–145)
Total Bilirubin: 1.1 mg/dL (ref 0.3–1.2)
Total Protein: 8.5 g/dL — ABNORMAL HIGH (ref 6.5–8.1)

## 2019-11-01 LAB — GLUCOSE, CAPILLARY
Glucose-Capillary: 112 mg/dL — ABNORMAL HIGH (ref 70–99)
Glucose-Capillary: 165 mg/dL — ABNORMAL HIGH (ref 70–99)
Glucose-Capillary: 254 mg/dL — ABNORMAL HIGH (ref 70–99)
Glucose-Capillary: 167 mg/dL — ABNORMAL HIGH (ref 70–99)

## 2019-11-01 LAB — PROTIME-INR
INR: 2.9 — ABNORMAL HIGH (ref 0.8–1.2)
Prothrombin Time: 29.1 seconds — ABNORMAL HIGH (ref 11.4–15.2)

## 2019-11-01 MED ORDER — SPIRONOLACTONE 25 MG PO TABS
100.0000 mg | ORAL_TABLET | Freq: Every day | ORAL | Status: DC
  Administered 2019-11-02: 100 mg via ORAL
  Filled 2019-11-01: qty 4

## 2019-11-01 MED ORDER — TORSEMIDE 20 MG PO TABS: 40.0000 mg | ORAL_TABLET | Freq: Every day | ORAL | Status: DC

## 2019-11-01 MED ORDER — TORSEMIDE 20 MG PO TABS
40.0000 mg | ORAL_TABLET | Freq: Every day | ORAL | Status: DC
  Administered 2019-11-01 – 2019-11-02 (×2): 40 mg via ORAL
  Filled 2019-11-01 (×2): qty 2

## 2019-11-01 NOTE — Progress Notes (Signed)
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,paroxysmal atrial fibrillation, history of prior stroke, history of DM, CKD 3b, OSA admitted on 10/23/2019 with acute on chronic diastolic dysfunction CHF exacerbation and ascites secondary to liver cirrhosis as well as AKI having failed outpatient attempt at oral diuresis  Assessment & Plan:   Principal Problem:   Acute on chronic combined systolic and diastolic CHF (congestive heart failure) (HCC) Active Problems:   Essential hypertension   CVA (cerebral vascular accident) ([place])   S/P aortic valve replacement   Diabetes (HCC)   OSA (obstructive sleep apnea)   CKD (chronic kidney disease), stage III   Other secondary pulmonary hypertension (HCC)   Acute on Chronic HFpEF Hx of pulmonary HTN and RV dysfunction     - 04/2019 echo LVEF 60-65%, elevated LA pressure, moderate RV dysfunction, severe RVE, severe biatrial enlargement, mod MR, severe TR, severe pulm HTN     - Repeat echo on 10/23/2019 with EF down to 55 to 60%, with severe pulmonary hypertension PASP of 65     - failed outpt: torsemide 80mg  bid, metolazone 10mg as needed (her SCr was trending up and she was developing ascites)     -Per cardiology, pt is continuing on scheduled IV lasix     - cardiology following, s/p RHC 8/10 -Now s/p US guided paracentesis on 8/13 yielding over 4L fluid. Fluid analysis neg for sbp  S/p aortic valve replacement with [facility] Jude AVR in 1995/1996     - continue coumadin dosing per pharmacy -Seems to be stable at this time  OSA     - history of noncompliance with CPAP -Currently seems stable at this time  Hx of paroxysmal A. fib and prior CVA     - on Coumadin, Toprol-XL; follow BP/HR -Stable currently  Hx  of cirrhosis on prior imaging studies     - doesn't appear to have had work-up for cirrhosis     - abdominal ultrasound consistent with liver cirrhosis with ascites      - ammonia 53; on lactulose, mentation ok; folow     - s/p paracentesis on 10/24/2018 for removal of 4.2 L: fluid studies negative     - Viral hepatitis profile negative, TSH normal, LDL 27     -Continue to follow LFTs  AKI on CKD3b     - SCr on admission was 2.20, baseline creatinine =1.4      - SCr is 2.07 today; watch nephrotoxins, follow -recheck bmet in AM  Chronic macrocytic anemia     - baseline Hgb is about 9     - hgb trends reviewed, stable thus far, noted to be 8.9 on most recent check     - B12 supplement  DMt2     - SSI, DM diet, CBG -glycemic trends reviewed, remains stable at this time   DVT prophylaxis: Warfarin Code Status: Full Family Communication: Pt in room, family currently at bedside  Status is: Inpatient  Remains inpatient appropriate because:Unsafe d/c plan, IV treatments appropriate due to intensity of illness or inability to take PO and Inpatient level of care appropriate due to severity of illness  Dispo: The patient is from: Home              Anticipated d/c is to: Home  Anticipated d/c date is: 3 days              Patient currently is not medically stable to d/c.   Consultants:   Cardiology  Procedures:   RHC  Antimicrobials: Anti-infectives (From admission, onward)   None      Subjective: No complaints this AM  Objective: Vitals:   11/01/19 0336 11/01/19 0341 11/01/19 0912 11/01/19 1216  BP:  115/64 (!) 110/53 109/72  Pulse:  67 77 80  Resp:  18  18  Temp:  97.9 F (36.6 C)  97.6 F (36.4 C)  TempSrc:  Oral  Oral  SpO2:  96%  98%  Weight: 56.5 kg     Height:        Intake/Output Summary (Last 24 hours) at 11/01/2019 1522 Last data filed at 11/01/2019 1300 Gross per 24 hour  Intake 740 ml  Output 2100 ml  Net -1360 ml   Filed  Weights   10/30/19 0456 10/31/19 0413 11/01/19 0336  Weight: 59.9 kg 61.7 kg 56.5 kg    Examination: General exam: Awake, laying in bed, in nad Respiratory system: Normal respiratory effort, no wheezing Cardiovascular system: regular rate, s1, s2 Gastrointestinal system: Soft, nondistended, positive BS Central nervous system: CN2-12 grossly intact, strength intact Extremities: Perfused, no clubbing Skin: Normal skin turgor, no notable skin lesions seen Psychiatry: Mood normal // no visual hallucinations   Data Reviewed: I have personally reviewed following labs and imaging studies  CBC: Recent Labs  Lab 10/28/19 0455 10/28/19 0455 10/28/19 1114 10/28/19 1115 10/29/19 0653 10/31/19 0427 11/01/19 0703  WBC 4.7  --   --   --  4.4 4.8 7.8  NEUTROABS  --   --   --   --  2.5  --   --   HGB 8.7*   < > 9.9* 9.2* 8.9* 9.6* 10.3*  HCT 27.7*   < > 29.0* 27.0* 28.2* 30.1* 31.8*  MCV 109.9*  --   --   --  111.0* 110.3* 109.3*  PLT 132*  --   --   --  130* 127* 148*   < > = values in this interval not displayed.   Basic Metabolic Panel: Recent Labs  Lab 10/26/19 0654 10/26/19 0654 10/27/19 0600 10/27/19 0600 10/28/19 0455 10/28/19 1114 10/28/19 1115 10/29/19 0653 10/30/19 0427 10/31/19 0427 11/01/19 0703  NA 136   < > 135   < > 136   < > 141 135 134* 133* 133*  K 3.2*   < > 3.9   < > 3.5   < > 3.5 3.6 3.7 4.2 3.8  CL 93*   < > 91*   < > 92*  --   --  92* 91* 91* 91*  CO2 31   < > 32   < > 32  --   --  32 33* 30 31  GLUCOSE 86   < > 85   < > 100*  --   --  90 89 141* 86  BUN 52*   < > 53*   < > 44*  --   --  43* 43* 45* 45*  CREATININE 2.12*   < > 2.26*   < > 2.11*  --   --  2.14* 2.07* 2.00* 1.78*  CALCIUM 8.7*   < > 8.8*   < > 8.7*  --   --  8.6* 8.5* 8.8* 8.8*  MG  --   --   --   --   --   --   --  2.5*  --   --   --   PHOS 3.8  --  3.5  --   --   --   --  3.5  --   --   --    < > = values in this interval not displayed.   GFR: Estimated Creatinine Clearance: 22.1  mL/min (A) (by C-G formula based on SCr of 1.78 mg/dL (H)). Liver Function Tests: Recent Labs  Lab 10/26/19 0654 10/27/19 0600 10/29/19 0653 10/31/19 0427 11/01/19 0703  AST  --   --   --  36 31  ALT  --   --   --  10 10  ALKPHOS  --   --   --  63 62  BILITOT  --   --   --  1.2 1.1  PROT  --   --   --  8.3* 8.5*  ALBUMIN 2.4* 2.5* 2.0* 2.2* 2.3*   No results for input(s): LIPASE, AMYLASE in the last 168 hours. No results for input(s): AMMONIA in the last 168 hours. Coagulation Profile: Recent Labs  Lab 10/28/19 0455 10/29/19 0653 10/30/19 0427 10/31/19 0427 11/01/19 0459  INR 2.8* 2.6* 2.9* 2.7* 2.9*   Cardiac Enzymes: No results for input(s): CKTOTAL, CKMB, CKMBINDEX, TROPONINI in the last 168 hours. BNP (last 3 results) No results for input(s): PROBNP in the last 8760 hours. HbA1C: No results for input(s): HGBA1C in the last 72 hours. CBG: Recent Labs  Lab 10/31/19 0634 10/31/19 1114 10/31/19 2108 11/01/19 0602 11/01/19 1214  GLUCAP 132* 204* 262* 112* 165*   Lipid Profile: No results for input(s): CHOL, HDL, LDLCALC, TRIG, CHOLHDL, LDLDIRECT in the last 72 hours. Thyroid Function Tests: No results for input(s): TSH, T4TOTAL, FREET4, T3FREE, THYROIDAB in the last 72 hours. Anemia Panel: No results for input(s): VITAMINB12, FOLATE, FERRITIN, TIBC, IRON, RETICCTPCT in the last 72 hours. Sepsis Labs: No results for input(s): PROCALCITON, LATICACIDVEN in the last 168 hours.  Recent Results (from the past 240 hour(s))  SARS Coronavirus 2 by RT PCR (hospital order, performed in [facility] hospital lab) Nasopharyngeal Nasopharyngeal Swab     Status: None   Collection Time: 10/23/19 11:52 AM   Specimen: Nasopharyngeal Swab  Result Value Ref Range Status   SARS Coronavirus 2 NEGATIVE NEGATIVE Final    Comment: (NOTE) SARS-CoV-2 target nucleic acids are NOT DETECTED.  The SARS-CoV-2 RNA is generally detectable in upper and lower respiratory specimens during the  acute phase of infection. The lowest concentration of SARS-CoV-2 viral copies this assay can detect is 250 copies / mL. A negative result does not preclude SARS-CoV-2 infection and should not be used as the sole basis for treatment or other patient management decisions.  A negative result may occur with improper specimen collection / handling, submission of specimen other than nasopharyngeal swab, presence of viral mutation(s) within the areas targeted by this assay, and inadequate number of viral copies (<250 copies / mL). A negative result must be combined with clinical observations, patient history, and epidemiological information.  Fact Sheet for Patients:   StrictlyIdeas.no  Fact Sheet for Healthcare Providers: BankingDealers.co.za  This test is not yet approved or  cleared by the [place] FDA and has been authorized for detection and/or diagnosis of SARS-CoV-2 by FDA under an Emergency Use Authorization (EUA).  This EUA will remain in effect (meaning this test can be used) for the duration of the COVID-19 declaration under Section 564(b)(1) of the Act, 21 U.S.C. section 360bbb-3(b)(1), unless the authorization  is terminated or revoked sooner.  Performed at [facility], [address], [place], [place] [address]   Gram stain     Status: None   Collection Time: 10/24/19  2:22 PM   Specimen: Ascitic; Body Fluid  Result Value Ref Range Status   Specimen Description ASCITIC  Final   Special Requests NONE  Final   Gram Stain   Final    CYTOSPIN SMEAR NO ORGANISMS SEEN WBC PRESENT, PREDOMINANTLY MONONUCLEAR Performed at [facility], [address] [place], [place] [address]    Report Status 10/24/2019 FINAL  Final  Culture, body fluid-bottle     Status: None   Collection Time: 10/24/19  2:25 PM   Specimen: Ascitic  Result Value Ref Range Status   Specimen Description ASCITIC  Final   Special Requests 10CC  Final    Culture   Final    NO GROWTH 5 DAYS Performed at [facility], [address], [place], [place] [address]    Report Status 10/29/2019 FINAL  Final  Culture, body fluid-bottle     Status: None (Preliminary result)   Collection Time: 10/31/19  4:57 PM   Specimen: Peritoneal Washings  Result Value Ref Range Status   Specimen Description PERITONEAL ABDOMEN  Final   Special Requests BOTTLES DRAWN AEROBIC AND ANAEROBIC  Final   Culture   Final    NO GROWTH < 24 HOURS Performed at [facility], [place] [address] [place], [place] [address]    Report Status PENDING  Incomplete  Gram stain     Status: None   Collection Time: 10/31/19  4:57 PM   Specimen: Peritoneal Washings  Result Value Ref Range Status   Specimen Description PERITONEAL ABDOMEN  Final   Special Requests NONE  Final   Gram Stain   Final    WBC PRESENT,BOTH PMN AND MONONUCLEAR NO ORGANISMS SEEN CYTOSPIN SMEAR Performed at [facility], [address] [place], [place] [address]    Report Status 10/31/2019 FINAL  Final     Radiology Studies: IR Paracentesis  Result Date: 10/31/2019 INDICATION: Patient with history of diastolic dysfunction status post aortic valve replacement, a fib, CHF and liver cirrhosis found to have ascites. Request is for therapeutic and diagnostic paracentesis EXAM: ULTRASOUND GUIDED THERAPEUTIC AND DIAGNOSTIC PARACENTESIS MEDICATIONS: Lidocaine 1% 10 mL COMPLICATIONS: None immediate. PROCEDURE: Informed written consent was obtained from the patient after a discussion of the risks, benefits and alternatives to treatment. A timeout was performed prior to the initiation of the procedure. Initial ultrasound scanning demonstrates a large amount of ascites within the right lower abdominal quadrant. The right lower abdomen was prepped and draped in the usual sterile fashion. 1% lidocaine was used for local anesthesia. Following this, a 19 gauge, 7-cm, Yueh catheter was introduced. An ultrasound image  was saved for documentation purposes. The paracentesis was performed. The catheter was removed and a dressing was applied. The patient tolerated the procedure well without immediate post procedural complication. FINDINGS: A total of approximately 4.2 L of straw-colored fluid was removed. Samples were sent to the laboratory as requested by the clinical team. IMPRESSION: Successful ultrasound-guided therapeutic and diagnostic paracentesis yielding 4.2 liters of peritoneal fluid. Read by: Rushie Nyhan, NP Electronically Signed   By: Aletta Edouard M.D.   On: 10/31/2019 17:04    Scheduled Meds: . fluticasone  1 spray Each Nare QHS  . HYDROcodone-acetaminophen  1 tablet Oral BID  . insulin aspart  0-5 Units Subcutaneous QHS  . insulin  aspart  0-6 Units Subcutaneous TID WC  . lactulose  20 g Oral Daily  . loratadine  10 mg Oral Daily  . metoprolol succinate  25 mg Oral q morning - 10a  . pantoprazole  40 mg Oral QAC breakfast  . potassium chloride  10 mEq Oral q morning - 10a  . predniSONE  40 mg Oral Q breakfast  . simvastatin  40 mg Oral Q2000  . sodium chloride flush  3 mL Intravenous Q12H  . [START ON 11/02/2019] spironolactone  100 mg Oral Daily  . torsemide  40 mg Oral Daily  . vitamin B-12  500 mcg Oral Daily  . Vitamin D (Ergocalciferol)  50,000 Units Oral Q Sun  . warfarin  2 mg Oral q1600  . Warfarin - Pharmacist Dosing Inpatient   Does not apply q1600   Continuous Infusions: . sodium chloride       LOS: 9 days   Marylu Lund, MD [facility] Pager On Amion  If 7PM-7AM, please contact night-coverage 11/01/2019, 3:22 PM

## 2019-11-01 NOTE — Progress Notes (Signed)
RT placed pt on CPAP dream station for the night on auto titrate of max 10 min 5 w/no oxygen blended in to the system. Pt respiratory status is stable at this time no distress noted. RT will continue to monitor.

## 2019-11-01 NOTE — Progress Notes (Signed)
Cardiology Progress Note  Patient ID: Mary [name] MRN: [id number] DOB: [birth year] Date of Encounter: 11/01/2019  Primary Cardiologist: Carlyle Dolly, MD  Subjective   Chief Complaint: Breathing improved but not back to baseline.   HPI: S/p paracentesis yesterday. Good urine output. Still not at baseline.   ROS:  All other ROS reviewed and negative. Pertinent positives noted in the HPI.     Inpatient Medications  Scheduled Meds: . fluticasone  1 spray Each Nare QHS  . HYDROcodone-acetaminophen  1 tablet Oral BID  . insulin aspart  0-5 Units Subcutaneous QHS  . insulin aspart  0-6 Units Subcutaneous TID WC  . lactulose  20 g Oral Daily  . loratadine  10 mg Oral Daily  . metoprolol succinate  25 mg Oral q morning - 10a  . pantoprazole  40 mg Oral QAC breakfast  . potassium chloride  10 mEq Oral q morning - 10a  . predniSONE  40 mg Oral Q breakfast  . simvastatin  40 mg Oral Q2000  . sodium chloride flush  3 mL Intravenous Q12H  . spironolactone  50 mg Oral Daily  . torsemide  40 mg Oral Daily  . vitamin B-12  500 mcg Oral Daily  . Vitamin D (Ergocalciferol)  50,000 Units Oral Q Sun  . warfarin  2 mg Oral q1600  . Warfarin - Pharmacist Dosing Inpatient   Does not apply q1600   Continuous Infusions: . sodium chloride     PRN Meds: sodium chloride, acetaminophen, albuterol, ALPRAZolam, bisacodyl, lidocaine, ondansetron **OR** ondansetron (ZOFRAN) IV, sodium chloride flush, traZODone   Vital Signs   Vitals:   11/01/19 0021 11/01/19 0336 11/01/19 0341 11/01/19 0912  BP:   115/64 (!) 110/53  Pulse: 71  67 77  Resp: 18  18   Temp:   97.9 F (36.6 C)   TempSrc:   Oral   SpO2: 96%  96%   Weight:  56.5 kg    Height:        Intake/Output Summary (Last 24 hours) at 11/01/2019 0929 Last data filed at 11/01/2019 0900 Gross per 24 hour  Intake 720 ml  Output 2350 ml  Net -1630 ml   Last 3 Weights 11/01/2019 10/31/2019 10/30/2019  Weight (lbs) 124 lb 9 oz 136 lb 0.4 oz  132 lb 0.9 oz  Weight (kg) 56.5 kg 61.7 kg 59.9 kg      Telemetry  Overnight telemetry shows Afib 70s, which I personally reviewed.   ECG  The most recent ECG shows Afib 68 bpm with LBBB, which I personally reviewed.   Physical Exam   Vitals:   11/01/19 0021 11/01/19 0336 11/01/19 0341 11/01/19 0912  BP:   115/64 (!) 110/53  Pulse: 71  67 77  Resp: 18  18   Temp:   97.9 F (36.6 C)   TempSrc:   Oral   SpO2: 96%  96%   Weight:  56.5 kg    Height:         Intake/Output Summary (Last 24 hours) at 11/01/2019 0929 Last data filed at 11/01/2019 0900 Gross per 24 hour  Intake 720 ml  Output 2350 ml  Net -1630 ml    Last 3 Weights 11/01/2019 10/31/2019 10/30/2019  Weight (lbs) 124 lb 9 oz 136 lb 0.4 oz 132 lb 0.9 oz  Weight (kg) 56.5 kg 61.7 kg 59.9 kg    Body mass index is 20.1 kg/m.   General: Well nourished, well developed, in no acute distress Head: Atraumatic,  normal size  Eyes: PEERLA, EOMI  Neck: Supple, JVD 8-10 cm H20 Endocrine: No thryomegaly Cardiac: Normal S1, S2; 3/6 HSM, irregular rhythm Lungs: +rales   Abd: Soft, nontender, no hepatomegaly  Ext: trace edema  Musculoskeletal: No deformities, BUE and BLE strength normal and equal Skin: Warm and dry, no rashes   Neuro: Alert and oriented to person, place, time, and situation, CNII-XII grossly intact, no focal deficits  Psych: Normal mood and affect   Labs  High Sensitivity Troponin:  No results for input(s): TROPONINIHS in the last 720 hours.   Cardiac EnzymesNo results for input(s): TROPONINI in the last 168 hours. No results for input(s): TROPIPOC in the last 168 hours.  Chemistry Recent Labs  Lab 10/29/19 0653 10/29/19 0653 10/30/19 0427 10/31/19 0427 11/01/19 0703  NA 135   < > 134* 133* 133*  K 3.6   < > 3.7 4.2 3.8  CL 92*   < > 91* 91* 91*  CO2 32   < > 33* 30 31  GLUCOSE 90   < > 89 141* 86  BUN 43*   < > 43* 45* 45*  CREATININE 2.14*   < > 2.07* 2.00* 1.78*  CALCIUM 8.6*   < > 8.5* 8.8*  8.8*  PROT  --   --   --  8.3* 8.5*  ALBUMIN 2.0*  --   --  2.2* 2.3*  AST  --   --   --  36 31  ALT  --   --   --  10 10  ALKPHOS  --   --   --  63 62  BILITOT  --   --   --  1.2 1.1  GFRNONAA 21*   < > 22* 23* 26*  GFRAA 24*   < > 25* 26* 30*  ANIONGAP 11   < > 10 12 11    < > = values in this interval not displayed.    Hematology Recent Labs  Lab 10/29/19 0653 10/31/19 0427 11/01/19 0703  WBC 4.4 4.8 7.8  RBC 2.54* 2.73* 2.91*  HGB 8.9* 9.6* 10.3*  HCT 28.2* 30.1* 31.8*  MCV 111.0* 110.3* 109.3*  MCH 35.0* 35.2* 35.4*  MCHC 31.6 31.9 32.4  RDW 16.5* 16.0* 15.9*  PLT 130* 127* 148*   BNPNo results for input(s): BNP, PROBNP in the last 168 hours.  DDimer No results for input(s): DDIMER in the last 168 hours.   Radiology  IR Paracentesis  Result Date: 10/31/2019 INDICATION: Patient with history of diastolic dysfunction status post aortic valve replacement, a fib, CHF and liver cirrhosis found to have ascites. Request is for therapeutic and diagnostic paracentesis EXAM: ULTRASOUND GUIDED THERAPEUTIC AND DIAGNOSTIC PARACENTESIS MEDICATIONS: Lidocaine 1% 10 mL COMPLICATIONS: None immediate. PROCEDURE: Informed written consent was obtained from the patient after a discussion of the risks, benefits and alternatives to treatment. A timeout was performed prior to the initiation of the procedure. Initial ultrasound scanning demonstrates a large amount of ascites within the right lower abdominal quadrant. The right lower abdomen was prepped and draped in the usual sterile fashion. 1% lidocaine was used for local anesthesia. Following this, a 19 gauge, 7-cm, Yueh catheter was introduced. An ultrasound image was saved for documentation purposes. The paracentesis was performed. The catheter was removed and a dressing was applied. The patient tolerated the procedure well without immediate post procedural complication. FINDINGS: A total of approximately 4.2 L of straw-colored fluid was removed.  Samples were sent to the laboratory as requested  by the clinical team. IMPRESSION: Successful ultrasound-guided therapeutic and diagnostic paracentesis yielding 4.2 liters of peritoneal fluid. Read by: Rushie Nyhan, NP Electronically Signed   By: Aletta Edouard M.D.   On: 10/31/2019 17:04    Cardiac Studies  Echo 10/23/2019 1. Left ventricular ejection fraction, by estimation, is 55 to 60%. The  left ventricle has normal function. The left ventricle has no regional  wall motion abnormalities. Left ventricular diastolic parameters are  indeterminate.  2. Right ventricular systolic function is severely reduced. The right  ventricular size is severely enlarged. There is moderately elevated  pulmonary artery systolic pressure.  3. Left atrial size was moderately dilated.  4. Right atrial size was severely dilated.  5. The mitral valve is normal in structure. Mild mitral valve  regurgitation. No evidence of mitral stenosis.  6. The tricuspid valve is abnormal. Tricuspid valve regurgitation is  severe.  7. There is a mechanical valve in the AV position. From medical record St  Jude valve, unknown type and size. . The aortic valve has been  repaired/replaced. Aortic valve regurgitation is not visualized. No aortic  stenosis is present.  8. Severe pulmonary HTN, PASP is 65 mmHg.  9. The inferior vena cava is dilated in size with <50% respiratory  variability, suggesting right atrial pressure of 15 mmHg.   RHC 10/28/2019 RA 12 mmHG RV 53/3 PA 57/17/32 -> mild PHTN PCWP 21 TPG 70mmHG  Patient Profile  Mary [name] is a 79 y.o. female with OSA, HFpEF, cirrhosis, moderate to severe TR, DM, mechanical AVR, permanent Afib, CKD IV who was admitted 10/23/2019 to [facility] for ADHF. Transferred to [facility] for RHC. [place] 10/28/2019 consistent with mild pHTN. S/p paracentesis with 4.2 L removal.   Assessment & Plan   1. Volume Overload/HFpEF/Right heart failure/Mild pHTN/Cirrhosis/Ascites -admitted  with RV failure and concerns for severe pHTN -RHC 8/10 with HFpEF and mild pHTN (RA 12 mmHG, Mean PA 32 mmHG, PCWP 32 mmHG, PCWP 21). -had significant ascites with 4.2 removal with paracentesis 8/13 -to me, she has HFpEF but cirrhosis. I suspect much of her volume overload is related to cirrhosis. Hospital medicine to evaluation paracentesis fluid -Net negative 5.4 L this admission. Appearing euvolemia. I have transitioned to 40 mg torsemide daily.  -would continue aldactone for cirrhosis treatment. Increased to 100 mg QD given improvement in renal function  2. PHTN/RV failure -appears to be HFpEF driven given TPG of 11 mmHG -elevated PCWP -suspect volume overload is mixed cirrhosis picture (CO is borderline for high output) and HFpEF.  -needs compliance with CPAP -torsemide today  3. Mechanical AVR -stable gradients. Continue coumadin with INR goal 2-3.   For questions or updates, please contact [facility] Please consult [email or web address] for contact info under   Time Spent with Patient: I have spent a total of 25 minutes with patient reviewing hospital notes, telemetry, EKGs, labs and examining the patient as well as establishing an assessment and plan that was discussed with the patient.  > 50% of time was spent in direct patient care.    Signed, Addison Naegeli. Audie Box, [place]  11/01/2019 9:29 AM

## 2019-11-01 NOTE — Progress Notes (Signed)
ANTICOAGULATION CONSULT NOTE - Follow-Up  Pharmacy Consult for warfarin Indication: mechanical AVR and afib  Allergies  Allergen Reactions  . Tape Itching    Redness, Please use "paper" tape only    Patient Measurements: Height: 5\' 6"  (167.6 cm) Weight: 56.5 kg (124 lb 9 oz) IBW/kg (Calculated) : 59.3  Vital Signs: Temp: 97.9 F (36.6 C) (08/14 0341) Temp Source: Oral (08/14 0341) BP: 110/53 (08/14 0912) Pulse Rate: 77 (08/14 0912)  Labs: Recent Labs    10/30/19 0427 10/31/19 0427 11/01/19 0459 11/01/19 0703  HGB  --  9.6*  --  10.3*  HCT  --  30.1*  --  31.8*  PLT  --  127*  --  148*  LABPROT 29.5* 27.9* 29.1*  --   INR 2.9* 2.7* 2.9*  --   CREATININE 2.07* 2.00*  --  1.78*    Estimated Creatinine Clearance: 22.1 mL/min (A) (by C-G formula based on SCr of 1.78 mg/dL (H)).   Medical History: Past Medical History:  Diagnosis Date  . Anxiety   . Diabetes mellitus with neuropathy ([place])   . Diabetic neuropathy ([place])   . DJD (degenerative joint disease)   . H/O aortic valve replacement   . Hyperlipidemia   . Hypertension   . Neuromuscular disorder ([facility])    neuropathy in feet  . Obesity   . Sleep apnea   . Stroke [facility])    " light stroke"  . Wears glasses     Assessment: Pharmacy consulted to dose warfarin for this patient with a history of mechanical AVR, afib, CVA.  She takes 2 mg daily at home.  INR has been therapeutic on this regimen.  Warfarin dose held 8/9 for cardiac cath 8/10.  Cath complete, ok to resume warfarin.   INR today remains therapeutic at 2.9. No significant drug interactions noted, no bleeding noted, CBC stable.   Goal of Therapy:  INR 2.5-3.5 Monitor platelets by anticoagulation protocol: Yes   Plan:  Continue warfarin 2 mg daily  Monitor daily INR for warfarin dose adjustments, CBC, s/s bleeding  Romilda Garret, PharmD PGY1 Acute Care Pharmacy Resident Phone: [phone] 11/01/2019 10:46 AM  Please check [email or web address] for unit  specific pharmacy phone numbers.

## 2019-11-02 LAB — COMPREHENSIVE METABOLIC PANEL
ALT: 17 U/L (ref 0–44)
AST: 53 U/L — ABNORMAL HIGH (ref 15–41)
Albumin: 2.3 g/dL — ABNORMAL LOW (ref 3.5–5.0)
Alkaline Phosphatase: 71 U/L (ref 38–126)
Anion gap: 9 (ref 5–15)
BUN: 45 mg/dL — ABNORMAL HIGH (ref 8–23)
CO2: 32 mmol/L (ref 22–32)
Calcium: 8.9 mg/dL (ref 8.9–10.3)
Chloride: 94 mmol/L — ABNORMAL LOW (ref 98–111)
Creatinine, Ser: 1.58 mg/dL — ABNORMAL HIGH (ref 0.44–1.00)
GFR calc Af Amer: 35 mL/min — ABNORMAL LOW (ref >60)
GFR calc non Af Amer: 30 mL/min — ABNORMAL LOW (ref >60)
Glucose, Bld: 111 mg/dL — ABNORMAL HIGH (ref 70–99)
Potassium: 4 mmol/L (ref 3.5–5.1)
Sodium: 135 mmol/L (ref 135–145)
Total Bilirubin: 0.8 mg/dL (ref 0.3–1.2)
Total Protein: 8.4 g/dL — ABNORMAL HIGH (ref 6.5–8.1)

## 2019-11-02 LAB — CBC
HCT: 32.2 % — ABNORMAL LOW (ref 36.0–46.0)
Hemoglobin: 10.4 g/dL — ABNORMAL LOW (ref 12.0–15.0)
MCH: 35.5 pg — ABNORMAL HIGH (ref 26.0–34.0)
MCHC: 32.3 g/dL (ref 30.0–36.0)
MCV: 109.9 fL — ABNORMAL HIGH (ref 80.0–100.0)
Platelets: 158 10*3/uL (ref 150–400)
RBC: 2.93 MIL/uL — ABNORMAL LOW (ref 3.87–5.11)
RDW: 16 % — ABNORMAL HIGH (ref 11.5–15.5)
WBC: 5.8 10*3/uL (ref 4.0–10.5)
nRBC: 0 % (ref 0.0–0.2)

## 2019-11-02 LAB — GLUCOSE, CAPILLARY
Glucose-Capillary: 115 mg/dL — ABNORMAL HIGH (ref 70–99)
Glucose-Capillary: 199 mg/dL — ABNORMAL HIGH (ref 70–99)

## 2019-11-02 LAB — PROTIME-INR
INR: 2.5 — ABNORMAL HIGH (ref 0.8–1.2)
Prothrombin Time: 26.3 seconds — ABNORMAL HIGH (ref 11.4–15.2)

## 2019-11-02 MED ORDER — SPIRONOLACTONE 100 MG PO TABS: 100.0000 mg | ORAL_TABLET | Freq: Every day | ORAL | 0 refills | Status: DC

## 2019-11-02 MED ORDER — LACTULOSE 10 GM/15ML PO SOLN: 20.0000 g | Freq: Every day | ORAL | 0 refills | Status: DC

## 2019-11-02 MED ORDER — METOPROLOL SUCCINATE ER 25 MG PO TB24: 25.0000 mg | ORAL_TABLET | Freq: Every morning | ORAL | 0 refills | Status: DC

## 2019-11-02 MED ORDER — TORSEMIDE 20 MG PO TABS: 40.0000 mg | ORAL_TABLET | Freq: Every day | ORAL | 0 refills | Status: DC

## 2019-11-02 NOTE — Progress Notes (Signed)
Pt is alert with husband at beside discharge paper work complete, Tele box D/C currently Getting dressed.

## 2019-11-02 NOTE — Progress Notes (Signed)
RT placed pt on CPAP dream station on auto titrate for the night on max 10 min 5 w/no oxygen bled into the system. Pt respiratory status is stable at this time. RT will continue to monitor.

## 2019-11-02 NOTE — TOC Transition Note (Signed)
Transition of Care [facility]) - CM/SW Discharge Note   Patient Details  Name: Mary [name] MRN: [id number] Date of Birth: 04-14-1940  Transition of Care [facility]) CM/SW Contact:  Carles Collet, RN Phone Number: 11/02/2019, 1:53 PM   Clinical Narrative:   Notified [facility] that patient will DC today with [facility] PT OT services. No DME needs, spouse to provide transport    Final next level of care: [place] Barriers to Discharge: Continued Medical Work up   Patient Goals and CMS Choice Patient states their goals for this hospitalization and ongoing recovery are:: to go back home CMS Medicare.gov Compare Post Acute Care list provided to:: Patient Choice offered to / list presented to : Patient  Discharge Placement                       Discharge Plan and Services In-house Referral: Clinical Social Work   Post Acute Care Choice: Home Health                               Social Determinants of Health (SDOH) Interventions     Readmission Risk Interventions No flowsheet data found.

## 2019-11-02 NOTE — Progress Notes (Signed)
Cardiology Progress Note  Patient ID: Mary [name] MRN: [id number] DOB: March 16, 1941 Date of Encounter: 11/02/2019  Primary Cardiologist: Carlyle Dolly, MD  Subjective   Chief Complaint: SOB   HPI: Good diuresis.  Creatinine continues to come down.  Breathing still not at baseline.  Feels weak.  ROS:  All other ROS reviewed and negative. Pertinent positives noted in the HPI.     Inpatient Medications  Scheduled Meds: . fluticasone  1 spray Each Nare QHS  . HYDROcodone-acetaminophen  1 tablet Oral BID  . insulin aspart  0-5 Units Subcutaneous QHS  . insulin aspart  0-6 Units Subcutaneous TID WC  . lactulose  20 g Oral Daily  . loratadine  10 mg Oral Daily  . metoprolol succinate  25 mg Oral q morning - 10a  . pantoprazole  40 mg Oral QAC breakfast  . potassium chloride  10 mEq Oral q morning - 10a  . predniSONE  40 mg Oral Q breakfast  . simvastatin  40 mg Oral Q2000  . sodium chloride flush  3 mL Intravenous Q12H  . spironolactone  100 mg Oral Daily  . torsemide  40 mg Oral Daily  . vitamin B-12  500 mcg Oral Daily  . Vitamin D (Ergocalciferol)  50,000 Units Oral Q Sun  . warfarin  2 mg Oral q1600  . Warfarin - Pharmacist Dosing Inpatient   Does not apply q1600   Continuous Infusions: . sodium chloride     PRN Meds: sodium chloride, acetaminophen, albuterol, ALPRAZolam, bisacodyl, lidocaine, ondansetron **OR** ondansetron (ZOFRAN) IV, sodium chloride flush, traZODone   Vital Signs   Vitals:   11/01/19 1923 11/01/19 2126 11/02/19 0032 11/02/19 0352  BP: (!) 109/55   (!) 124/57  Pulse: 78 74 94 (!) 53  Resp: 18 16 18 18   Temp: 97.6 F (36.4 C)   97.8 F (36.6 C)  TempSrc: Oral     SpO2: 100% 100% 100% 100%  Weight:    54.8 kg  Height:        Intake/Output Summary (Last 24 hours) at 11/02/2019 0935 Last data filed at 11/02/2019 0020 Gross per 24 hour  Intake 340 ml  Output 1950 ml  Net -1610 ml   Last 3 Weights 11/02/2019 11/01/2019 10/31/2019  Weight (lbs)  120 lb 13 oz 124 lb 9 oz 136 lb 0.4 oz  Weight (kg) 54.8 kg 56.5 kg 61.7 kg       Telemetry  Overnight telemetry shows atrial fibrillation with rates in the 40-50 range, which I personally reviewed.   ECG  The most recent ECG shows atrial fibrillation, heart rate 68, nonspecific IVCD, which I personally reviewed.   Physical Exam   Vitals:   11/01/19 1923 11/01/19 2126 11/02/19 0032 11/02/19 0352  BP: (!) 109/55   (!) 124/57  Pulse: 78 74 94 (!) 53  Resp: 18 16 18 18   Temp: 97.6 F (36.4 C)   97.8 F (36.6 C)  TempSrc: Oral     SpO2: 100% 100% 100% 100%  Weight:    54.8 kg  Height:         Intake/Output Summary (Last 24 hours) at 11/02/2019 0935 Last data filed at 11/02/2019 0020 Gross per 24 hour  Intake 340 ml  Output 1950 ml  Net -1610 ml    Last 3 Weights 11/02/2019 11/01/2019 10/31/2019  Weight (lbs) 120 lb 13 oz 124 lb 9 oz 136 lb 0.4 oz  Weight (kg) 54.8 kg 56.5 kg 61.7 kg  Body mass index is 19.5 kg/m.   General: Well nourished, well developed, in no acute distress Head: Atraumatic, normal size  Eyes: PEERLA, EOMI  Neck: Supple, JVD 8-10 cmH2O Endocrine: No thryomegaly Cardiac: Normal S1, S2; irregular rhythm, 3/6 holosystolic murmur, does vary with respiration Lungs: Positive rales in the lung bases Abd: Soft, distended Ext: Trace edema Musculoskeletal: No deformities, BUE and BLE strength normal and equal Skin: Warm and dry, no rashes   Neuro: Alert and oriented to person, place, time, and situation, CNII-XII grossly intact, no focal deficits  Psych: Normal mood and affect   Labs  High Sensitivity Troponin:  No results for input(s): TROPONINIHS in the last 720 hours.   Cardiac EnzymesNo results for input(s): TROPONINI in the last 168 hours. No results for input(s): TROPIPOC in the last 168 hours.  Chemistry Recent Labs  Lab 10/31/19 0427 11/01/19 0703 11/02/19 0706  NA 133* 133* 135  K 4.2 3.8 4.0  CL 91* 91* 94*  CO2 30 31 32  GLUCOSE 141* 86  111*  BUN 45* 45* 45*  CREATININE 2.00* 1.78* 1.58*  CALCIUM 8.8* 8.8* 8.9  PROT 8.3* 8.5* 8.4*  ALBUMIN 2.2* 2.3* 2.3*  AST 36 31 53*  ALT 10 10 17   ALKPHOS 63 62 71  BILITOT 1.2 1.1 0.8  GFRNONAA 23* 26* 30*  GFRAA 26* 30* 35*  ANIONGAP 12 11 9     Hematology Recent Labs  Lab 10/31/19 0427 11/01/19 0703 11/02/19 0706  WBC 4.8 7.8 5.8  RBC 2.73* 2.91* 2.93*  HGB 9.6* 10.3* 10.4*  HCT 30.1* 31.8* 32.2*  MCV 110.3* 109.3* 109.9*  MCH 35.2* 35.4* 35.5*  MCHC 31.9 32.4 32.3  RDW 16.0* 15.9* 16.0*  PLT 127* 148* 158   BNPNo results for input(s): BNP, PROBNP in the last 168 hours.  DDimer No results for input(s): DDIMER in the last 168 hours.   Radiology  IR Paracentesis  Result Date: 10/31/2019 INDICATION: Patient with history of diastolic dysfunction status post aortic valve replacement, a fib, CHF and liver cirrhosis found to have ascites. Request is for therapeutic and diagnostic paracentesis EXAM: ULTRASOUND GUIDED THERAPEUTIC AND DIAGNOSTIC PARACENTESIS MEDICATIONS: Lidocaine 1% 10 mL COMPLICATIONS: None immediate. PROCEDURE: Informed written consent was obtained from the patient after a discussion of the risks, benefits and alternatives to treatment. A timeout was performed prior to the initiation of the procedure. Initial ultrasound scanning demonstrates a large amount of ascites within the right lower abdominal quadrant. The right lower abdomen was prepped and draped in the usual sterile fashion. 1% lidocaine was used for local anesthesia. Following this, a 19 gauge, 7-cm, Yueh catheter was introduced. An ultrasound image was saved for documentation purposes. The paracentesis was performed. The catheter was removed and a dressing was applied. The patient tolerated the procedure well without immediate post procedural complication. FINDINGS: A total of approximately 4.2 L of straw-colored fluid was removed. Samples were sent to the laboratory as requested by the clinical team.  IMPRESSION: Successful ultrasound-guided therapeutic and diagnostic paracentesis yielding 4.2 liters of peritoneal fluid. Read by: Rushie Nyhan, NP Electronically Signed   By: Aletta Edouard M.D.   On: 10/31/2019 17:04    Cardiac Studies  TTE 10/23/2019 1. Left ventricular ejection fraction, by estimation, is 55 to 60%. The  left ventricle has normal function. The left ventricle has no regional  wall motion abnormalities. Left ventricular diastolic parameters are  indeterminate.  2. Right ventricular systolic function is severely reduced. The right  ventricular size is  severely enlarged. There is moderately elevated  pulmonary artery systolic pressure.  3. Left atrial size was moderately dilated.  4. Right atrial size was severely dilated.  5. The mitral valve is normal in structure. Mild mitral valve  regurgitation. No evidence of mitral stenosis.  6. The tricuspid valve is abnormal. Tricuspid valve regurgitation is  severe.  7. There is a mechanical valve in the AV position. From medical record St  Jude valve, unknown type and size. . The aortic valve has been  repaired/replaced. Aortic valve regurgitation is not visualized. No aortic  stenosis is present.  8. Severe pulmonary HTN, PASP is 65 mmHg.  9. The inferior vena cava is dilated in size with <50% respiratory  variability, suggesting right atrial pressure of 15 mmHg.   Right heart catheterization 10/28/2019 -Right atrial pressure 12 mmHg -RV 53/3 -PA 57/17/32 -Wedge 21 mmHg -Transpulmonary gradient 11 mmHg   Patient Profile  Mary [name] is a 79 y.o. female with OSA, HFpEF, cirrhosis, moderate to severe tricuspid regurgitation, diabetes, mechanical AVR, permanent atrial fibrillation, CKD stage III/IV who was admitted on 10/23/2019 to [facility] for volume overload.  Transfer to [facility] for right heart catheterization.  Right heart catheterization consistent with mild pulmonary hypertension.  Status post  paracentesis with 4.2 L of fluid removal.  Assessment & Plan   1.  Volume overload/HFpEF/right heart failure/mild pulmonary hypertension/cirrhosis/ascites -Admitted with RV failure concerns for severe pulmonary hypertension.  Right heart cath with mild pulmonary hypertension. -She does have elevated pulmonary capillary wedge pressure. -4.2 L of fluid removed on paracentesis. -She does have HFpEF.  I feel her symptoms are more cirrhosis related. -She appears she is approaching euvolemia.  Would recommend continue torsemide 40 mg daily.  She is on Aldactone 100 mg daily.  She can increase her torsemide to 40 mg twice daily if needed.  2.  Pulmonary hypertension/right heart failure -Suspect this is a combination of HFpEF as well as OSA.  Apparently there were issues with compliance with CPAP. -Additionally she was noted to have borderline high output values on cardiac output from her heart catheterization.  There could be a component of high-output related to cirrhosis. -Nonetheless we are treating her cirrhosis and managing her volume overload.  3.  Mechanical AVR -Stable gradients.  Continue Coumadin.  INR level at goal.  4.  Permanent atrial fibrillation -Rate controlled on metoprolol.  Continue Coumadin.  [facility] will sign off.   Medication Recommendations: Continue metoprolol.  Torsemide 40 mg daily, Aldactone 100 mg daily, may increase to torsemide twice daily for increased fluid, consider referral to gastroenterology for cirrhosis management Other recommendations (labs, testing, etc): None Follow up as an outpatient: We will arrange hospital follow-up with Dr. Carlyle Dolly of the [place] office in 2 to 4 weeks.  For questions or updates, please contact [place] Please consult [email or web address] for contact info under   Time Spent with Patient: I have spent a total of 25 minutes with patient reviewing hospital notes, telemetry, EKGs, labs and examining the patient as  well as establishing an assessment and plan that was discussed with the patient.  > 50% of time was spent in direct patient care.    Signed, Addison Naegeli. Audie Box, [place]  11/02/2019 9:35 AM

## 2019-11-02 NOTE — Progress Notes (Signed)
ANTICOAGULATION CONSULT NOTE - Follow-Up  Pharmacy Consult for warfarin Indication: mechanical AVR and afib  Allergies  Allergen Reactions  . Tape Itching    Redness, Please use "paper" tape only    Patient Measurements: Height: 5\' 6"  (167.6 cm) Weight: 54.8 kg (120 lb 13 oz) IBW/kg (Calculated) : 59.3  Vital Signs: Temp: 97.8 F (36.6 C) (08/15 0352) BP: 124/57 (08/15 0352) Pulse Rate: 53 (08/15 0352)  Labs: Recent Labs    10/31/19 0427 10/31/19 0427 11/01/19 0459 11/01/19 0703 11/02/19 0706  HGB 9.6*   < >  --  10.3* 10.4*  HCT 30.1*  --   --  31.8* 32.2*  PLT 127*  --   --  148* 158  LABPROT 27.9*  --  29.1*  --  26.3*  INR 2.7*  --  2.9*  --  2.5*  CREATININE 2.00*  --   --  1.78* 1.58*   < > = values in this interval not displayed.    Estimated Creatinine Clearance: 24.2 mL/min (A) (by C-G formula based on SCr of 1.58 mg/dL (H)).   Medical History: Past Medical History:  Diagnosis Date  . Anxiety   . Diabetes mellitus with neuropathy ([place])   . Diabetic neuropathy ([place])   . DJD (degenerative joint disease)   . H/O aortic valve replacement   . Hyperlipidemia   . Hypertension   . Neuromuscular disorder ([facility])    neuropathy in feet  . Obesity   . Sleep apnea   . Stroke [facility])    " light stroke"  . Wears glasses     Assessment: Pharmacy consulted to dose warfarin for this patient with a history of mechanical AVR, afib, CVA.  She takes 2 mg daily at home.  INR has been therapeutic on this regimen.  Warfarin dose held 8/9 for cardiac cath 8/10.  Cath complete, ok to resume warfarin.   INR today remains therapeutic at 2.5 on PTA dose of warfarin. No significant drug interactions noted, no bleeding noted, CBC improving. Renal function is improving as well as nutrition.  Goal of Therapy:  INR 2.5-3.5 Monitor platelets by anticoagulation protocol: Yes   Plan:  Continue warfarin 2 mg daily  Monitor daily INR for warfarin dose adjustments, CBC, s/s  bleeding  Romilda Garret, PharmD PGY1 Acute Care Pharmacy Resident Phone: [phone] 11/02/2019 9:33 AM  Please check [email or web address] for unit specific pharmacy phone numbers.

## 2019-11-02 NOTE — Discharge Summary (Signed)
Physician Discharge Summary  Mary [name] [name]:[id number] DOB: [birth year] DOA: 10/23/2019  PCP: [name], MD  Admit date: 10/23/2019 Discharge date: 11/02/2019  Admitted From: Home Disposition:  Home  Recommendations for Outpatient Follow-up:  1. Follow up with PCP in 1-2 weeks 2. Follow up with Cardiology as scheduled 3. 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,paroxysmal atrial fibrillation, history of prior stroke, history of DM, CKD 3b, OSA admitted on 10/23/2019 with acute on chronic diastolic dysfunction CHF exacerbation and ascites secondary to liver cirrhosis as well as AKI having failed outpatient attempt at oral diuresis  Discharge Diagnoses:  Principal Problem:   Acute on chronic combined systolic and diastolic CHF (congestive heart failure) ([place]) Active Problems:   Essential hypertension   CVA (cerebral vascular accident) ([place])   S/P aortic valve replacement   Diabetes (HCC)   OSA (obstructive sleep apnea)   CKD (chronic kidney disease), stage III   Other secondary pulmonary hypertension (HCC)  Acute on Chronic HFpEF Hx of pulmonary HTN and RV dysfunction - 04/2019 echo LVEF 60-65%, elevated LA pressure, moderate RV dysfunction, severe RVE, severe biatrial enlargement, mod MR, severe TR, severe pulm HTN - Repeat echo on 10/23/2019 with EF down to 55 to 60%, with severe pulmonary hypertension PASP of 65 - failed outpt: torsemide 80mg  bid, metolazone 10mg as needed (her SCr was trending up and she was developing ascites) -Per cardiology, pt was continued on scheduled IV lasix while in hospital -  cardiology had been following, s/p RHC 8/10 -Now s/p US guided paracentesis on 8/13 yielding over 4L fluid. Fluid analysis neg for sbp -Concern for likely component of below cirrhosis causing volume overload  S/p aortic valve replacement with Saint Jude AVR in 1995/1996 - continue coumadin dosing per pharmacy -Seems to be stable at this time  OSA - history of noncompliance with CPAP -Currently seems stable at this time  Hx of paroxysmal A. fib and prior CVA - on Coumadin, Toprol-XL; follow BP/HR -Stable currently  Hx of cirrhosis on prior imaging studies - doesn't appear to have had work-up for cirrhosis - abdominal ultrasound consistent with liver cirrhosis with ascites  - ammonia 53; on lactulose, mentation ok; folow - s/p paracentesis on 10/24/2018 for removal of 4.2 L: fluid studies negative - Viral hepatitis profile negative, TSH normal, LDL 27 -Recommend referral to GI as outpatient for further work up of cirrhosis  AKI on CKD3b - SCr on admission was 2.20, baseline creatinine =1.4  - SCr down to 1.58 at time of d/c -recheck bmet in AM  Chronic macrocytic anemia - baseline Hgb is about 9 - hgb trends reviewed, stable thus far - B12 supplement  DMt2 - SSI, DM diet, CBG -glycemic trends reviewed, remains stable at this time  Discharge Instructions   Allergies as of 11/02/2019      Reactions   Tape Itching   Redness, Please use "paper" tape only      Medication List    STOP taking these medications   metolazone 10 MG tablet Commonly known as: ZAROXOLYN   polyethylene glycol powder 17 GM/SCOOP powder Commonly known as: GLYCOLAX/MIRALAX     TAKE these medications   acetaminophen 500 MG tablet Commonly known as: TYLENOL Take 500 mg by mouth  2 (two) times daily as needed for mild pain or moderate pain.   ALPRAZolam 0.5 MG tablet Commonly known as: XANAX Take by mouth daily. In the mornings    bisacodyl 5 MG EC tablet Commonly known as: DULCOLAX Take 5 mg by mouth daily as needed for moderate constipation.   cetirizine 10 MG tablet Commonly known as: ZYRTEC Take 10 mg by mouth at bedtime as needed for allergies.   fluticasone 50 MCG/ACT nasal spray Commonly known as: FLONASE Place 1 spray into both nostrils at bedtime.   HYDROcodone-acetaminophen 10-325 MG tablet Commonly known as: NORCO Take 1 tablet by mouth 2 (two) times daily.   lactulose 10 GM/15ML solution Commonly known as: CHRONULAC Take 30 mLs (20 g total) by mouth daily. Start taking on: November 03, 2019   metoprolol succinate 25 MG 24 hr tablet Commonly known as: TOPROL-XL Take 1 tablet (25 mg total) by mouth every morning. Start taking on: November 03, 2019 What changed:   medication strength  how much to take  additional instructions  Another medication with the same name was removed. Continue taking this medication, and follow the directions you see here.   pantoprazole 40 MG tablet Commonly known as: PROTONIX Take 1 tablet (40 mg total) by mouth daily before breakfast.   potassium chloride 10 MEQ tablet Commonly known as: KLOR-CON Take 10 mEq by mouth every morning.   simvastatin 40 MG tablet Commonly known as: ZOCOR Take 40 mg by mouth daily at 8 pm.   spironolactone 100 MG tablet Commonly known as: ALDACTONE Take 1 tablet (100 mg total) by mouth daily. Start taking on: November 03, 2019   torsemide 20 MG tablet Commonly known as: DEMADEX Take 2 tablets (40 mg total) by mouth daily. Start taking on: November 03, 2019 What changed: when to take this   vitamin B-12 500 MCG tablet Commonly known as: CYANOCOBALAMIN Take 500 mcg by mouth daily.   Vitamin D (Ergocalciferol) 1.25 MG (50000 UNIT) Caps capsule Commonly known as: DRISDOL Take 50,000 Units by mouth every Sunday.   warfarin 2 MG tablet Commonly known as: COUMADIN Take as directed. If you are unsure how to take this  medication, talk to your nurse or doctor. Original instructions: Take 1 tablet daily or as directed by Coumadin clinic       [place] Follow up.        [name], MD. Schedule an appointment as soon as possible for a visit in 2 week(s).   Specialty: Family Medicine Contact information: [place] [address] [phone]        Arnoldo Lenis, MD .   Specialty: Cardiology Contact information: [name] [address] [phone]              Allergies  Allergen Reactions  . Tape Itching    Redness, Please use "paper" tape only    Consultations:  Cardiology  IR  Procedures/Studies: DG Chest 2 View  Result Date: 10/14/2019 CLINICAL DATA:  79 year old female with shortness of breath. EXAM: CHEST - 2 VIEW COMPARISON:  Chest radiograph dated 04/23/2019. FINDINGS: Small right pleural effusion with right lung base atelectasis or infiltrate similar to prior radiograph. There is background of emphysema. No pneumothorax. There is mild cardiomegaly with probable mild vascular congestion. Median sternotomy wires. Atherosclerotic calcification of the aorta. No acute osseous pathology. IMPRESSION: 1. Stable right pleural effusion with right lung base atelectasis or  infiltrate. 2. Cardiomegaly with probable mild vascular congestion. Electronically Signed   By: Anner Crete M.D.   On: 10/14/2019 18:10   US Abdomen Complete  Result Date: 10/24/2019 CLINICAL DATA:  Abdominal distension EXAM: ABDOMEN ULTRASOUND COMPLETE COMPARISON:  03/27/2016 FINDINGS: Gallbladder: Gallbladder is well distended without gallbladder wall thickening. Cholelithiasis and gallbladder sludge is seen. Common bile duct: Diameter: 3.1 mm. Liver: Liver is shrunken with a nodular appearance. No focal mass lesion is noted. Stable cyst is noted in the left lobe of the liver measuring 1.8 cm in greatest dimension. Portal  vein is patent on color Doppler imaging with normal direction of blood flow towards the liver. IVC: No abnormality visualized. Pancreas: Visualized portion unremarkable. Spleen: Size and appearance within normal limits. Right Kidney: Length: 7.9 cm. Slight increased echogenicity is noted. Scarring is also noted in the upper pole of the right kidney. No obstructive changes are seen. Left Kidney: Length: 9.4 cm. Echogenicity within normal limits. No mass or hydronephrosis visualized. Abdominal aorta: No aneurysm visualized. Other findings: Ascites is noted. Additionally a right-sided pleural effusion is noted. IMPRESSION: Changes consistent with cirrhosis of the liver with associated ascites. Right-sided pleural effusion. Left hepatic cyst stable from the prior study. Cholelithiasis and gallbladder sludge without complicating factors. Electronically Signed   By: Inez Catalina M.D.   On: 10/24/2019 09:02   CARDIAC CATHETERIZATION  Result Date: 10/28/2019  Hemodynamic findings consistent with mild pulmonary hypertension.  Large V wave on PCWP waveform-suggestive of mitral valve disease.  Mild Pulmonary Hypertension - (TPG 11 mmHg) The waveform on PCWP suggest much disease, not seen on echo. [name], MD   US Abdomen Limited  Result Date: 10/28/2019 CLINICAL DATA:  Evaluate for ascites EXAM: LIMITED ABDOMEN ULTRASOUND FOR ASCITES TECHNIQUE: Limited ultrasound survey for ascites was performed in all four abdominal quadrants. COMPARISON:  10/24/2019 FINDINGS: Changes of cirrhosis of the liver are noted. Mild to moderate ascites is seen similar to that noted on the prior exam. IMPRESSION: Recurrent ascites and changes of hepatic cirrhosis. Electronically Signed   By: Inez Catalina M.D.   On: 10/28/2019 14:41   US Paracentesis  Result Date: 10/24/2019 INDICATION: Cirrhosis, ascites EXAM: ULTRASOUND GUIDED DIAGNOSTIC AND THERAPEUTIC PARACENTESIS MEDICATIONS: None COMPLICATIONS: None immediate PROCEDURE:  Informed written consent was obtained from the patient after a discussion of the risks, benefits and alternatives to treatment. A timeout was performed prior to the initiation of the procedure. Initial ultrasound scanning demonstrates a large amount of ascites within the LEFT lower abdominal quadrant. The right lower abdomen was prepped and draped in the usual sterile fashion. 1% lidocaine was used for local anesthesia. Following this, a 5 [place] Yueh catheter was introduced. An ultrasound image was saved for documentation purposes. The paracentesis was performed. The catheter was removed and a dressing was applied. The patient tolerated the procedure well without immediate post procedural complication. FINDINGS: A total of approximately 4.2 mL of yellow ascitic fluid was removed. Samples were sent to the laboratory as requested by the clinical team. IMPRESSION: Successful ultrasound-guided paracentesis yielding 4.2 L liters of peritoneal fluid. Electronically Signed   By: Lavonia Dana M.D.   On: 10/24/2019 15:13   DG Chest Portable 1 View  Result Date: 10/23/2019 CLINICAL DATA:  Evaluate for pulmonary edema EXAM: PORTABLE CHEST 1 VIEW COMPARISON:  10/14/2019 FINDINGS: Cardiomegaly. Prior median sternotomy. Moderate right pleural effusion with right lower lobe atelectasis. Areas of scarring in the lungs bilaterally. No overt edema. No acute bony abnormality. IMPRESSION: Stable moderate right  effusion with right base atelectasis. Cardiomegaly. Chronic changes.  No overt edema. Electronically Signed   By: Rolm Baptise M.D.   On: 10/23/2019 11:00   ECHOCARDIOGRAM COMPLETE  Result Date: 10/23/2019    ECHOCARDIOGRAM REPORT   Patient Name:   MENA SIMONIS Date of Exam: 10/23/2019 Medical Rec #:  [id number]   Height:       66.0 in Accession #:    [id number]  Weight:       160.2 lb Date of Birth:  1940-04-21   BSA:          1.820 m Patient Age:    46 years    BP:           119/65 mmHg Patient Gender: F           HR:            71 bpm. Exam Location:  [facility] Procedure: 2D Echo Indications:    Dyspnea 786.09 / R06.00  History:        Patient has prior history of Echocardiogram examinations, most                 recent 04/24/2019. Stroke, Arrythmias:Atrial Fibrillation; Risk                 Factors:Non-Smoker, Dyslipidemia, Diabetes and Hypertension.                 CKD.  Sonographer:    Leavy Cella RDCS (AE) Referring Phys: [id number] [place]  1. Left ventricular ejection fraction, by estimation, is 55 to 60%. The left ventricle has normal function. The left ventricle has no regional wall motion abnormalities. Left ventricular diastolic parameters are indeterminate.  2. Right ventricular systolic function is severely reduced. The right ventricular size is severely enlarged. There is moderately elevated pulmonary artery systolic pressure.  3. Left atrial size was moderately dilated.  4. Right atrial size was severely dilated.  5. The mitral valve is normal in structure. Mild mitral valve regurgitation. No evidence of mitral stenosis.  6. The tricuspid valve is abnormal. Tricuspid valve regurgitation is severe.  7. There is a mechanical valve in the AV position. From medical record St Jude valve, unknown type and size. . The aortic valve has been repaired/replaced. Aortic valve regurgitation is not visualized. No aortic stenosis is present.  8. Severe pulmonary HTN, PASP is 65 mmHg.  9. The inferior vena cava is dilated in size with <50% respiratory variability, suggesting right atrial pressure of 15 mmHg. FINDINGS  Left Ventricle: Left ventricular ejection fraction, by estimation, is 55 to 60%. The left ventricle has normal function. The left ventricle has no regional wall motion abnormalities. The left ventricular internal cavity size was small. There is no left ventricular hypertrophy. Left ventricular diastolic parameters are indeterminate. Right Ventricle: The right ventricular size is severely enlarged. No  increase in right ventricular wall thickness. Right ventricular systolic function is severely reduced. There is moderately elevated pulmonary artery systolic pressure. The tricuspid regurgitant velocity is 3.46 m/s, and with an assumed right atrial pressure of 10 mmHg, the estimated right ventricular systolic pressure is 32.2 mmHg. Left Atrium: Left atrial size was moderately dilated. Right Atrium: Right atrial size was severely dilated. Pericardium: There is no evidence of pericardial effusion. Mitral Valve: The mitral valve is normal in structure. There is mild thickening of the mitral valve leaflet(s). There is mild calcification of the mitral valve leaflet(s). Mild mitral annular calcification. Mild mitral valve regurgitation.  No evidence of  mitral valve stenosis. Tricuspid Valve: The tricuspid valve is abnormal. Tricuspid valve regurgitation is severe. No evidence of tricuspid stenosis. Aortic Valve: There is a mechanical valve in the AV position. From medical record St Jude valve, unknown type and size. The aortic valve has been repaired/replaced. Aortic valve regurgitation is not visualized. No aortic stenosis is present. Aortic valve  mean gradient measures 3.8 mmHg. Aortic valve peak gradient measures 6.4 mmHg. Pulmonic Valve: The pulmonic valve was not well visualized. Pulmonic valve regurgitation is mild to moderate. No evidence of pulmonic stenosis. Aorta: The aortic root is normal in size and structure. Pulmonary Artery: Severe pulmonary HTN, PASP is 65 mmHg. Venous: The inferior vena cava is dilated in size with less than 50% respiratory variability, suggesting right atrial pressure of 15 mmHg. IAS/Shunts: The interatrial septum was not well visualized.  LEFT VENTRICLE PLAX 2D LVIDd:         4.01 cm Diastology LVIDs:         3.50 cm LV e' lateral:   7.97 cm/s LV PW:         1.13 cm LV E/e' lateral: 14.1 LV IVS:        0.70 cm LV e' medial:    7.31 cm/s                        LV E/e' medial:  15.3   RIGHT VENTRICLE RV S prime:     4.05 cm/s TAPSE (M-mode): 0.8 cm LEFT ATRIUM             Index       RIGHT ATRIUM           Index LA diam:        3.60 cm 1.98 cm/m  RA Area:     18.80 cm LA Vol (A2C):   69.0 ml 37.92 ml/m RA Volume:   54.60 ml  30.00 ml/m LA Vol (A4C):   59.8 ml 32.86 ml/m LA Biplane Vol: 64.9 ml 35.66 ml/m  AORTIC VALVE AV Vmax:           126.67 cm/s AV Vmean:          93.430 cm/s AV VTI:            0.268 m AV Peak Grad:      6.4 mmHg AV Mean Grad:      3.8 mmHg LVOT Vmax:         64.97 cm/s LVOT Vmean:        44.995 cm/s LVOT VTI:          0.146 m LVOT/AV VTI ratio: 0.55  AORTA Ao Root diam: 2.30 cm MITRAL VALVE                TRICUSPID VALVE MV Area (PHT): 3.33 cm     TR Peak grad:   47.9 mmHg MV Decel Time: 228 msec     TR Vmax:        346.00 cm/s MV E velocity: 112.00 cm/s                             SHUNTS                             Systemic VTI: 0.15 m Carlyle Dolly MD Electronically signed by Carlyle Dolly MD Signature Date/Time: 10/23/2019/3:56:43 PM  Final    IR Paracentesis  Result Date: 10/31/2019 INDICATION: Patient with history of diastolic dysfunction status post aortic valve replacement, a fib, CHF and liver cirrhosis found to have ascites. Request is for therapeutic and diagnostic paracentesis EXAM: ULTRASOUND GUIDED THERAPEUTIC AND DIAGNOSTIC PARACENTESIS MEDICATIONS: Lidocaine 1% 10 mL COMPLICATIONS: None immediate. PROCEDURE: Informed written consent was obtained from the patient after a discussion of the risks, benefits and alternatives to treatment. A timeout was performed prior to the initiation of the procedure. Initial ultrasound scanning demonstrates a large amount of ascites within the right lower abdominal quadrant. The right lower abdomen was prepped and draped in the usual sterile fashion. 1% lidocaine was used for local anesthesia. Following this, a 19 gauge, 7-cm, Yueh catheter was introduced. An ultrasound image was saved for documentation purposes.  The paracentesis was performed. The catheter was removed and a dressing was applied. The patient tolerated the procedure well without immediate post procedural complication. FINDINGS: A total of approximately 4.2 L of straw-colored fluid was removed. Samples were sent to the laboratory as requested by the clinical team. IMPRESSION: Successful ultrasound-guided therapeutic and diagnostic paracentesis yielding 4.2 liters of peritoneal fluid. Read by: Rushie Nyhan, NP Electronically Signed   By: Aletta Edouard M.D.   On: 10/31/2019 17:04     Subjective: Very eager to go home today  Discharge Exam: Vitals:   11/02/19 1110 11/02/19 1200  BP:  (!) 118/51  Pulse: 80 69  Resp:  18  Temp:  98.2 F (36.8 C)  SpO2:  100%   Vitals:   11/02/19 0032 11/02/19 0352 11/02/19 1110 11/02/19 1200  BP:  (!) 124/57  (!) 118/51  Pulse: 94 (!) 53 80 69  Resp: 18 18  18   Temp:  97.8 F (36.6 C)  98.2 F (36.8 C)  TempSrc:    Oral  SpO2: 100% 100%  100%  Weight:  54.8 kg    Height:        General: Pt is alert, awake, not in acute distress Cardiovascular: RRR, S1/S2 +, no rubs, no gallops Respiratory: CTA bilaterally, no wheezing, no rhonchi Abdominal: Soft, NT, ND, bowel sounds + Extremities: no edema, no cyanosis   The results of significant diagnostics from this hospitalization (including imaging, microbiology, ancillary and laboratory) are listed below for reference.     Microbiology: Recent Results (from the past 240 hour(s))  Gram stain     Status: None   Collection Time: 10/24/19  2:22 PM   Specimen: Ascitic; Body Fluid  Result Value Ref Range Status   Specimen Description ASCITIC  Final   Special Requests NONE  Final   Gram Stain   Final    CYTOSPIN SMEAR NO ORGANISMS SEEN WBC PRESENT, PREDOMINANTLY MONONUCLEAR Performed at [facility], [address] [place], [place] [address]    Report Status 10/24/2019 FINAL  Final  Culture, body fluid-bottle     Status: None    Collection Time: 10/24/19  2:25 PM   Specimen: Ascitic  Result Value Ref Range Status   Specimen Description ASCITIC  Final   Special Requests 10CC  Final   Culture   Final    NO GROWTH 5 DAYS Performed at [facility], [address] [place], [place] [address]    Report Status 10/29/2019 FINAL  Final  Culture, body fluid-bottle     Status: None (Preliminary result)   Collection Time: 10/31/19  4:57 PM   Specimen: Peritoneal Washings  Result Value Ref Range Status   Specimen Description  PERITONEAL ABDOMEN  Final   Special Requests BOTTLES DRAWN AEROBIC AND ANAEROBIC  Final   Culture   Final    NO GROWTH 2 DAYS Performed at [facility], [address] [place], [place] [address]    Report Status PENDING  Incomplete  Gram stain     Status: None   Collection Time: 10/31/19  4:57 PM   Specimen: Peritoneal Washings  Result Value Ref Range Status   Specimen Description PERITONEAL ABDOMEN  Final   Special Requests NONE  Final   Gram Stain   Final    WBC PRESENT,BOTH PMN AND MONONUCLEAR NO ORGANISMS SEEN CYTOSPIN SMEAR Performed at [facility], [address] [place], [place] [address]    Report Status 10/31/2019 FINAL  Final     Labs: BNP (last 3 results) Recent Labs    04/23/19 1605 09/29/19 1503 10/23/19 1103  BNP 584.0* 504.0* 269.4*   Basic Metabolic Panel: Recent Labs  Lab 10/27/19 0600 10/28/19 0455 10/29/19 0653 10/30/19 0427 10/31/19 0427 11/01/19 0703 11/02/19 0706  NA 135   < > 135 134* 133* 133* 135  K 3.9   < > 3.6 3.7 4.2 3.8 4.0  CL 91*   < > 92* 91* 91* 91* 94*  CO2 32   < > 32 33* 30 31 32  GLUCOSE 85   < > 90 89 141* 86 111*  BUN 53*   < > 43* 43* 45* 45* 45*  CREATININE 2.26*   < > 2.14* 2.07* 2.00* 1.78* 1.58*  CALCIUM 8.8*   < > 8.6* 8.5* 8.8* 8.8* 8.9  MG  --   --  2.5*  --   --   --   --   PHOS 3.5  --  3.5  --   --   --   --    < > = values in this interval not displayed.   Liver Function Tests: Recent Labs  Lab  10/27/19 0600 10/29/19 0653 10/31/19 0427 11/01/19 0703 11/02/19 0706  AST  --   --  36 31 53*  ALT  --   --  10 10 17   ALKPHOS  --   --  63 62 71  BILITOT  --   --  1.2 1.1 0.8  PROT  --   --  8.3* 8.5* 8.4*  ALBUMIN 2.5* 2.0* 2.2* 2.3* 2.3*   No results for input(s): LIPASE, AMYLASE in the last 168 hours. No results for input(s): AMMONIA in the last 168 hours. CBC: Recent Labs  Lab 10/28/19 0455 10/28/19 1114 10/28/19 1115 10/29/19 0653 10/31/19 0427 11/01/19 0703 11/02/19 0706  WBC 4.7  --   --  4.4 4.8 7.8 5.8  NEUTROABS  --   --   --  2.5  --   --   --   HGB 8.7*   < > 9.2* 8.9* 9.6* 10.3* 10.4*  HCT 27.7*   < > 27.0* 28.2* 30.1* 31.8* 32.2*  MCV 109.9*  --   --  111.0* 110.3* 109.3* 109.9*  PLT 132*  --   --  130* 127* 148* 158   < > = values in this interval not displayed.   Cardiac Enzymes: No results for input(s): CKTOTAL, CKMB, CKMBINDEX, TROPONINI in the last 168 hours. BNP: Invalid input(s): POCBNP CBG: Recent Labs  Lab 11/01/19 1214 11/01/19 1647 11/01/19 2124 11/02/19 0559 11/02/19 1158  GLUCAP 165* 254* 167* 115* 199*   D-Dimer No results for input(s): DDIMER in  the last 72 hours. Hgb A1c No results for input(s): HGBA1C in the last 72 hours. Lipid Profile No results for input(s): CHOL, HDL, LDLCALC, TRIG, CHOLHDL, LDLDIRECT in the last 72 hours. Thyroid function studies No results for input(s): TSH, T4TOTAL, T3FREE, THYROIDAB in the last 72 hours.  Invalid input(s): FREET3 Anemia work up No results for input(s): VITAMINB12, FOLATE, FERRITIN, TIBC, IRON, RETICCTPCT in the last 72 hours. Urinalysis    Component Value Date/Time   COLORURINE STRAW (A) 04/26/2019 2003   APPEARANCEUR CLEAR 04/26/2019 2003   LABSPEC 1.005 04/26/2019 2003   PHURINE 6.0 04/26/2019 2003   GLUCOSEU NEGATIVE 04/26/2019 2003   [place] NEGATIVE 04/26/2019 2003   [place] NEGATIVE 04/26/2019 2003   [place] 04/26/2019 2003   PROTEINUR NEGATIVE  04/26/2019 2003   UROBILINOGEN 2.0 (H) 11/01/2007 1518   NITRITE NEGATIVE 04/26/2019 2003   LEUKOCYTESUR NEGATIVE 04/26/2019 2003   Sepsis Labs Invalid input(s): PROCALCITONIN,  WBC,  LACTICIDVEN Microbiology Recent Results (from the past 240 hour(s))  Gram stain     Status: None   Collection Time: 10/24/19  2:22 PM   Specimen: Ascitic; Body Fluid  Result Value Ref Range Status   Specimen Description ASCITIC  Final   Special Requests NONE  Final   Gram Stain   Final    CYTOSPIN SMEAR NO ORGANISMS SEEN WBC PRESENT, PREDOMINANTLY MONONUCLEAR Performed at [facility], [address] [place], [place] [address]    Report Status 10/24/2019 FINAL  Final  Culture, body fluid-bottle     Status: None   Collection Time: 10/24/19  2:25 PM   Specimen: Ascitic  Result Value Ref Range Status   Specimen Description ASCITIC  Final   Special Requests 10CC  Final   Culture   Final    NO GROWTH 5 DAYS Performed at [facility], [address] [place], [place] [address]    Report Status 10/29/2019 FINAL  Final  Culture, body fluid-bottle     Status: None (Preliminary result)   Collection Time: 10/31/19  4:57 PM   Specimen: Peritoneal Washings  Result Value Ref Range Status   Specimen Description PERITONEAL ABDOMEN  Final   Special Requests BOTTLES DRAWN AEROBIC AND ANAEROBIC  Final   Culture   Final    NO GROWTH 2 DAYS Performed at [facility], [place] [address] [place], [place] [address]    Report Status PENDING  Incomplete  Gram stain     Status: None   Collection Time: 10/31/19  4:57 PM   Specimen: Peritoneal Washings  Result Value Ref Range Status   Specimen Description PERITONEAL ABDOMEN  Final   Special Requests NONE  Final   Gram Stain   Final    WBC PRESENT,BOTH PMN AND MONONUCLEAR NO ORGANISMS SEEN CYTOSPIN SMEAR Performed at [facility], [address], [place], [place] [address]    Report Status 10/31/2019 FINAL  Final   Time spent: 30  min  SIGNED:   Marylu Lund, MD  [facility] 11/02/2019, 1:29 PM  If 7PM-7AM, please contact night-coverage

## 2019-11-03 ENCOUNTER — Telehealth: Payer: Self-pay | Admitting: *Deleted

## 2019-11-03 LAB — ALBUMIN, FLUID (OTHER): Albumin, Body Fluid Other: 1.8 g/dL

## 2019-11-03 LAB — CYTOLOGY - NON PAP

## 2019-11-03 NOTE — Telephone Encounter (Signed)
Patient was discharged from [facility] 11-02-2019.  Was told to contact Edrick Oh RN as to when she needs to come back in for CCR check.[phone] . Leave message if he doesn't answer.

## 2019-11-03 NOTE — Telephone Encounter (Signed)
Reviewed INR readings from the hospital.  Next INR appt will be in 2 wks on 11/17/19 at 2:30pm.  Left message for spouse per his request.

## 2019-11-04 ENCOUNTER — Telehealth: Payer: Self-pay | Admitting: *Deleted

## 2019-11-04 NOTE — Telephone Encounter (Signed)
Please call Otila Kluver w/ [facility] - she's needing to know when pt is needing to have her INR checked   [phone]

## 2019-11-04 NOTE — Telephone Encounter (Signed)
Spoke with [facility].  She is admitting pt today for Home Health services.  INR was 2.5 yesterday at hospital discharge.  Continue warfarin 2mg  daily and recheck INR on 11/10/19.  Otila Kluver verbalized understanding.

## 2019-11-05 LAB — CULTURE, BODY FLUID W GRAM STAIN -BOTTLE: Culture: NO GROWTH

## 2019-11-10 ENCOUNTER — Ambulatory Visit (INDEPENDENT_AMBULATORY_CARE_PROVIDER_SITE_OTHER): Payer: Medicare Other | Admitting: *Deleted

## 2019-11-10 DIAGNOSIS — Z5181 Encounter for therapeutic drug level monitoring: Secondary | ICD-10-CM | POA: Diagnosis not present

## 2019-11-10 DIAGNOSIS — I639 Cerebral infarction, unspecified: Secondary | ICD-10-CM

## 2019-11-10 DIAGNOSIS — Z952 Presence of prosthetic heart valve: Secondary | ICD-10-CM

## 2019-11-10 LAB — POCT INR: INR: 2.3 (ref 2.0–3.0)

## 2019-11-10 NOTE — Patient Instructions (Signed)
Take warfarin 2 tablets tonight then resume 1 tablet daily Recheck in 1 week Order given to [place]

## 2019-11-18 ENCOUNTER — Ambulatory Visit (INDEPENDENT_AMBULATORY_CARE_PROVIDER_SITE_OTHER): Payer: Medicare Other | Admitting: *Deleted

## 2019-11-18 DIAGNOSIS — Z5181 Encounter for therapeutic drug level monitoring: Secondary | ICD-10-CM | POA: Diagnosis not present

## 2019-11-18 DIAGNOSIS — I639 Cerebral infarction, unspecified: Secondary | ICD-10-CM

## 2019-11-18 DIAGNOSIS — Z952 Presence of prosthetic heart valve: Secondary | ICD-10-CM

## 2019-11-18 LAB — POCT INR: INR: 2.9 (ref 2.0–3.0)

## 2019-11-18 NOTE — Patient Instructions (Signed)
Continue warfarin 1 tablet daily Recheck in 2 weeks Order given to [place]

## 2019-12-02 ENCOUNTER — Ambulatory Visit (INDEPENDENT_AMBULATORY_CARE_PROVIDER_SITE_OTHER): Payer: Medicare Other | Admitting: *Deleted

## 2019-12-02 LAB — POCT INR: INR: 2.5 (ref 2.0–3.0)

## 2019-12-02 NOTE — Patient Instructions (Signed)
Continue warfarin 1 tablet daily Recheck in 2 weeks Order given to [place]

## 2019-12-07 ENCOUNTER — Other Ambulatory Visit: Payer: Self-pay

## 2019-12-07 ENCOUNTER — Emergency Department (HOSPITAL_COMMUNITY): Payer: Medicare Other

## 2019-12-07 ENCOUNTER — Encounter (HOSPITAL_COMMUNITY): Payer: Self-pay | Admitting: *Deleted

## 2019-12-07 ENCOUNTER — Emergency Department (HOSPITAL_COMMUNITY)
Admission: EM | Admit: 2019-12-07 | Discharge: 2019-12-08 | Disposition: A | Discharge status: Home / Self Care | Payer: Medicare Other | Attending: Emergency Medicine | Admitting: Emergency Medicine

## 2019-12-07 DIAGNOSIS — Z5321 Procedure and treatment not carried out due to patient leaving prior to being seen by health care provider: Secondary | ICD-10-CM | POA: Insufficient documentation

## 2019-12-07 DIAGNOSIS — Y92007 Garden or yard of unspecified non-institutional (private) residence as the place of occurrence of the external cause: Secondary | ICD-10-CM | POA: Diagnosis not present

## 2019-12-07 DIAGNOSIS — W19XXXA Unspecified fall, initial encounter: Secondary | ICD-10-CM | POA: Insufficient documentation

## 2019-12-07 DIAGNOSIS — J90 Pleural effusion, not elsewhere classified: Secondary | ICD-10-CM | POA: Diagnosis not present

## 2019-12-07 DIAGNOSIS — M25551 Pain in right hip: Secondary | ICD-10-CM | POA: Diagnosis not present

## 2019-12-07 DIAGNOSIS — Y9389 Activity, other specified: Secondary | ICD-10-CM | POA: Insufficient documentation

## 2019-12-07 NOTE — ED Triage Notes (Signed)
Pt fell backwards in the yard after she bent over to pick a flower.  Denies hitting head.  Pt is on Coumadin.  C/o right hip since her fall.

## 2019-12-08 ENCOUNTER — Emergency Department (HOSPITAL_COMMUNITY): Payer: Medicare Other

## 2019-12-08 LAB — COMPREHENSIVE METABOLIC PANEL
ALT: 19 U/L (ref 0–44)
AST: 31 U/L (ref 15–41)
Albumin: 3.4 g/dL — ABNORMAL LOW (ref 3.5–5.0)
Alkaline Phosphatase: 64 U/L (ref 38–126)
Anion gap: 7 (ref 5–15)
BUN: 37 mg/dL — ABNORMAL HIGH (ref 8–23)
CO2: 20 mmol/L — ABNORMAL LOW (ref 22–32)
Calcium: 9.2 mg/dL (ref 8.9–10.3)
Chloride: 103 mmol/L (ref 98–111)
Creatinine, Ser: 1.35 mg/dL — ABNORMAL HIGH (ref 0.44–1.00)
GFR calc Af Amer: 43 mL/min — ABNORMAL LOW (ref >60)
GFR calc non Af Amer: 37 mL/min — ABNORMAL LOW (ref >60)
Glucose, Bld: 172 mg/dL — ABNORMAL HIGH (ref 70–99)
Potassium: 5.3 mmol/L — ABNORMAL HIGH (ref 3.5–5.1)
Sodium: 130 mmol/L — ABNORMAL LOW (ref 135–145)
Total Bilirubin: 1.2 mg/dL (ref 0.3–1.2)
Total Protein: 8.5 g/dL — ABNORMAL HIGH (ref 6.5–8.1)

## 2019-12-08 LAB — PROTIME-INR
INR: 2.3 — ABNORMAL HIGH (ref 0.8–1.2)
Prothrombin Time: 24.6 seconds — ABNORMAL HIGH (ref 11.4–15.2)

## 2019-12-08 LAB — CBC WITH DIFFERENTIAL/PLATELET
Abs Immature Granulocytes: 0.06 10*3/uL (ref 0.00–0.07)
Basophils Absolute: 0 10*3/uL (ref 0.0–0.1)
Basophils Relative: 0 %
Eosinophils Absolute: 0.3 10*3/uL (ref 0.0–0.5)
Eosinophils Relative: 4 %
HCT: 36.1 % (ref 36.0–46.0)
Hemoglobin: 11.4 g/dL — ABNORMAL LOW (ref 12.0–15.0)
Immature Granulocytes: 1 %
Lymphocytes Relative: 12 %
Lymphs Abs: 0.9 10*3/uL (ref 0.7–4.0)
MCH: 36.4 pg — ABNORMAL HIGH (ref 26.0–34.0)
MCHC: 31.6 g/dL (ref 30.0–36.0)
MCV: 115.3 fL — ABNORMAL HIGH (ref 80.0–100.0)
Monocytes Absolute: 0.6 10*3/uL (ref 0.1–1.0)
Monocytes Relative: 8 %
Neutro Abs: 5.6 10*3/uL (ref 1.7–7.7)
Neutrophils Relative %: 75 %
Platelets: 171 10*3/uL (ref 150–400)
RBC: 3.13 MIL/uL — ABNORMAL LOW (ref 3.87–5.11)
RDW: 17.1 % — ABNORMAL HIGH (ref 11.5–15.5)
WBC: 7.5 10*3/uL (ref 4.0–10.5)
nRBC: 0 % (ref 0.0–0.2)

## 2019-12-09 ENCOUNTER — Telehealth: Payer: Self-pay | Admitting: Orthopaedic Surgery

## 2019-12-09 NOTE — Telephone Encounter (Signed)
I m not in the office!!! ????

## 2019-12-09 NOTE — Telephone Encounter (Signed)
Call received from Dr Vickey Sages office, [place], [phone], relaying that Dr Karie Kirks has received Xray report indicating "comminuted fracture through the greater trochanter with only minimal displacement of the fracture fragments" - states patient is in their office; please advise.* *Per verbal instructions upon discussing with Dr Luna [place]: patient is to go to the emergency room, either at [facility], or [facility]  I called back to their office to relay.

## 2019-12-09 NOTE — Telephone Encounter (Signed)
Dr Vickey Sages office, per Jacqlyn Larsen, called back to relay that patient did not want to stay at Emergency room at [facility] again today-states "they are both worn out". Jacqlyn Larsen is asking on behalf of Dr Karie Kirks, if Dr Aline Brochure can see patient in the office first,  as had been previously discussed with Dr Luna [place] and with Abigail Butts, clinic supervisor. Routing message to Dr Aline Brochure per request.  Dr Karie Kirks may also page Dr Aline Brochure.

## 2019-12-10 NOTE — Telephone Encounter (Signed)
I s/w husband and have added patient to Dr Amedeo Kinsman' schedule on Friday 12/12/19 at 930 am, cannot come any earlier.  Patient able to ambulate with wheelchair.

## 2019-12-12 ENCOUNTER — Ambulatory Visit (INDEPENDENT_AMBULATORY_CARE_PROVIDER_SITE_OTHER): Payer: Medicare Other | Admitting: Orthopedic Surgery

## 2019-12-12 ENCOUNTER — Other Ambulatory Visit: Payer: Self-pay

## 2019-12-12 ENCOUNTER — Encounter: Payer: Self-pay | Admitting: Student

## 2019-12-12 ENCOUNTER — Telehealth (INDEPENDENT_AMBULATORY_CARE_PROVIDER_SITE_OTHER): Payer: Medicare Other | Admitting: Student

## 2019-12-12 ENCOUNTER — Encounter: Payer: Self-pay | Admitting: Orthopedic Surgery

## 2019-12-12 VITALS — Ht 68.0 in | Wt 117.0 lb

## 2019-12-12 VITALS — BP 134/72 | HR 102

## 2019-12-12 DIAGNOSIS — I4821 Permanent atrial fibrillation: Secondary | ICD-10-CM

## 2019-12-12 DIAGNOSIS — K7460 Unspecified cirrhosis of liver: Secondary | ICD-10-CM | POA: Diagnosis not present

## 2019-12-12 DIAGNOSIS — Z952 Presence of prosthetic heart valve: Secondary | ICD-10-CM | POA: Diagnosis not present

## 2019-12-12 DIAGNOSIS — S72114A Nondisplaced fracture of greater trochanter of right femur, initial encounter for closed fracture: Secondary | ICD-10-CM | POA: Diagnosis not present

## 2019-12-12 DIAGNOSIS — I5032 Chronic diastolic (congestive) heart failure: Secondary | ICD-10-CM | POA: Diagnosis not present

## 2019-12-12 DIAGNOSIS — N179 Acute kidney failure, unspecified: Secondary | ICD-10-CM

## 2019-12-12 DIAGNOSIS — R188 Other ascites: Secondary | ICD-10-CM

## 2019-12-12 MED ORDER — SPIRONOLACTONE 50 MG PO TABS: 50.0000 mg | ORAL_TABLET | Freq: Every day | ORAL | Status: DC

## 2019-12-12 NOTE — Patient Instructions (Signed)
Medication Instructions:   Stop Potassium.  Continue all other medications.    Labwork: none  Testing/Procedures: none  Follow-Up: 3 months   Any Other Special Instructions Will Be Listed Below (If Applicable).  If you need a refill on your cardiac medications before your next appointment, please call your pharmacy.

## 2019-12-12 NOTE — Patient Instructions (Signed)
1.  Walk daily with assistance, and a walker 2. Avoid right hip extension and abduction 3.  Weight bearing as tolerated 4.  F/u 2 months, new XR at that time 5.  Please contact the clinic if you have any concerns before your next appointment.

## 2019-12-12 NOTE — Progress Notes (Signed)
Virtual Visit via Telephone Note   This visit type was conducted due to national recommendations for restrictions regarding the COVID-19 Pandemic (e.g. social distancing) in an effort to limit this patient's exposure and mitigate transmission in our community.  Due to her co-morbid illnesses, this patient is at least at moderate risk for complications without adequate follow up.  This format is felt to be most appropriate for this patient at this time.  The patient did not have access to video technology/had technical difficulties with video requiring transitioning to audio format only (telephone).  All issues noted in this document were discussed and addressed.  No physical exam could be performed with this format.  Please refer to the patient's chart for her  consent to telehealth for [facility].    Date:  12/13/2019   ID:  Mary [name], DOB January 24, 1941, MRN [id number] The patient was identified using 2 identifiers.  Patient Location: Home Provider Location: Office/Clinic  PCP:  [name], MD  Cardiologist:  Carlyle Dolly, MD  Electrophysiologist:  None   Evaluation Performed:  Follow-Up Visit  Chief Complaint: Hospital Follow-up  History of Present Illness:    Mary [name] is a 79 y.o. female with past medical history of persistent atrial fibrillation, chronic diastolic CHF, history of recurrent pleural effusions, mechanical AVR in 1995 (on Coumadin), HTN, HLD, OSA (intolerant to CPAP), cirrhosis, orthostatic hypotension and history of CVA who presents for a hospital follow-up telehealth visit.   She was examined by Dr. Harl Bowie in 10/2019 and was taking Torsemide 80 mg twice daily along with Metolazone 10 mg daily per her PCP and weight had continued to increase, up to 162 lbs and she reported worsening dyspnea and abdominal distention. She was sent to the ED for planned admission. Prior CT imaging had mentioned cirrhosis and given her worsening abdominal distention, she did  undergo paracentesis on 10/24/2019 with 4.2 L of fluid removed. Repeat echo showed a preserved EF of 55-60% but she did have severely reduced RV function. She responded well with IV Lasix 80 mg twice daily but did develop an AKI during admission. Was ultimately transferred to [facility] for a [place] on 10/28/2019 and this showed hemodynamic findings visit with mild pulmonary hypertension and she did have a large V wave on PCWP which was suggestive of mitral valve disease (echo that admission showed mild MR). Ultimately underwent another paracentesis on 8/13 with 4.2 L of fluid removed. Was transitioned to Torsemide 74m daily at discharge and Spironolactone 104mdaily with weight at 124 lbs on 8/14.  By review of notes, she did experience a fall and imaging showed a comminuted fracture of the right greater trochanter with minimal displacement. Scheduled to see Orthopedics today.   In talking with the patient and her husband today, she reports her breathing has been doing well since hospital discharge. No recent orthopnea, PND or lower extremity edema. No recent chest pain or palpitations.   She has experienced hip pain since her fall. They met with Orthopedics earlier today and surgical intervention is not planned at this time.   She has been experiencing diarrhea and her husband stopped her Aricept as symptoms started within 2 days of being initiated on this. Her PCP did reduce her Spironolactone to 5040maily in the interim and she is taking Torsemide 17m42mily.   The patient does not have symptoms concerning for COVID-19 infection (fever, chills, cough, or new shortness of breath).    Past Medical History:  Diagnosis Date  .  Anxiety   . Diabetes mellitus with neuropathy ([place])   . Diabetic neuropathy ([place])   . DJD (degenerative joint disease)   . H/O aortic valve replacement   . Hyperlipidemia   . Hypertension   . Neuromuscular disorder ([facility])    neuropathy in feet  . Obesity   . Sleep apnea     . Stroke [facility])    " light stroke"  . Wears glasses    Past Surgical History:  Procedure Laterality Date  . ANKLE SURGERY     Left tendon repair  . AORTIC VALVE REPLACEMENT  03/1994  . CARDIAC CATHETERIZATION     1996 [facility]  . CERVICAL SPINE SURGERY    . COLONOSCOPY    . HEMORRHOID SURGERY    . IR PARACENTESIS  10/31/2019  . KNEE ARTHROSCOPY Right 04/13/2015   Procedure: RIGHT KNEE ARTHROSCOPY WITH DEBRIDEMENT AND PARTIAL MEDIAL MENISCECTOMY;  Surgeon: Mcarthur Rossetti, MD;  Location: [place];  Service: Orthopedics;  Laterality: Right;  . KNEE ARTHROSCOPY W/ MENISCECTOMY Right 04/13/2015  . LUMBAR FUSION    . RIGHT HEART CATH N/A 10/28/2019   Procedure: RIGHT HEART CATH;  Surgeon: Leonie Man, MD;  Location: [place] [facility];  Service: Cardiovascular;  Laterality: N/A;     Current Meds  Medication Sig  . acetaminophen (TYLENOL) 500 MG tablet Take 500 mg by mouth 2 (two) times daily as needed for mild pain or moderate pain.   . bisacodyl (DULCOLAX) 5 MG EC tablet Take 5 mg by mouth daily as needed for moderate constipation.  . cetirizine (ZYRTEC) 10 MG tablet Take 10 mg by mouth at bedtime as needed for allergies.   . fluticasone (FLONASE) 50 MCG/ACT nasal spray Place 1 spray into both nostrils at bedtime.   Marland Kitchen HYDROcodone-acetaminophen (NORCO) 10-325 MG tablet Take 1 tablet by mouth 2 (two) times daily.   Marland Kitchen LORazepam (ATIVAN) 0.5 MG tablet Take 0.25 mg by mouth at bedtime.  . pantoprazole (PROTONIX) 40 MG tablet Take 1 tablet (40 mg total) by mouth daily before breakfast.  . simvastatin (ZOCOR) 40 MG tablet Take 40 mg by mouth daily at 8 pm.   . spironolactone (ALDACTONE) 50 MG tablet Take 1 tablet (50 mg total) by mouth daily.  Marland Kitchen torsemide (DEMADEX) 20 MG tablet Take 2 tablets (40 mg total) by mouth daily. (Patient taking differently: Take 20 mg by mouth daily. )  . vitamin B-12 (CYANOCOBALAMIN) 500 MCG tablet Take 500 mcg by mouth daily.  . Vitamin D,  Ergocalciferol, (DRISDOL) 1.25 MG (50000 UT) CAPS capsule Take 50,000 Units by mouth every Sunday.   . warfarin (COUMADIN) 2 MG tablet Take 1 tablet daily or as directed by Coumadin clinic  . [DISCONTINUED] potassium chloride (KLOR-CON) 10 MEQ tablet Take 10 mEq by mouth every morning.  . [DISCONTINUED] spironolactone (ALDACTONE) 100 MG tablet Take 1 tablet (100 mg total) by mouth daily.     Allergies:   Tape   Social History   Tobacco Use  . Smoking status: Never Smoker  . Smokeless tobacco: Never Used  Vaping Use  . Vaping Use: Never used  Substance Use Topics  . Alcohol use: No    Alcohol/week: 0.0 standard drinks  . Drug use: No     Family Hx: The patient's family history includes Dementia in her mother; Diabetes in her sister; Pneumonia in her sister.  ROS:   Please see the history of present illness.     All other systems reviewed and are  negative.   Prior CV studies:   The following studies were reviewed today:  Echocardiogram: 10/2019 IMPRESSIONS    1. Left ventricular ejection fraction, by estimation, is 55 to 60%. The  left ventricle has normal function. The left ventricle has no regional  wall motion abnormalities. Left ventricular diastolic parameters are  indeterminate.  2. Right ventricular systolic function is severely reduced. The right  ventricular size is severely enlarged. There is moderately elevated  pulmonary artery systolic pressure.  3. Left atrial size was moderately dilated.  4. Right atrial size was severely dilated.  5. The mitral valve is normal in structure. Mild mitral valve  regurgitation. No evidence of mitral stenosis.  6. The tricuspid valve is abnormal. Tricuspid valve regurgitation is  severe.  7. There is a mechanical valve in the AV position. From medical record St  Jude valve, unknown type and size. . The aortic valve has been  repaired/replaced. Aortic valve regurgitation is not visualized. No aortic  stenosis is  present.  8. Severe pulmonary HTN, PASP is 65 mmHg.  9. The inferior vena cava is dilated in size with <50% respiratory  variability, suggesting right atrial pressure of 15 mmHg.   RHC: 10/2019  Hemodynamic findings consistent with mild pulmonary hypertension.  Large V wave on PCWP waveform-suggestive of mitral valve disease.   Mild Pulmonary Hypertension - (TPG 11 mmHg) The waveform on PCWP suggest much disease, not seen on echo.   Labs/Other Tests and Data Reviewed:    EKG:  No ECG reviewed.  Recent Labs: 10/23/2019: B Natriuretic Peptide 565.0 10/25/2019: TSH 2.951 10/29/2019: Magnesium 2.5 12/08/2019: ALT 19; BUN 37; Creatinine, Ser 1.35; Hemoglobin 11.4; Platelets 171; Potassium 5.3; Sodium 130   Recent Lipid Panel Lab Results  Component Value Date/Time   CHOL 67 10/25/2019 06:07 AM   TRIG 61 10/25/2019 06:07 AM   HDL 28 (L) 10/25/2019 06:07 AM   CHOLHDL 2.4 10/25/2019 06:07 AM   LDLCALC 27 10/25/2019 06:07 AM    Wt Readings from Last 3 Encounters:  12/12/19 117 lb (53.1 kg)  11/02/19 120 lb 13 oz (54.8 kg)  10/23/19 162 lb (73.5 kg)     Objective:    Vital Signs:  Ht '5\' 8"'  (1.727 m)   Wt 117 lb (53.1 kg)   BMI 17.79 kg/m    Physical exam not performed as this was a Virtual Visit. Patient was talking during the visit and did not sound in acute distress. Respirations sounded unlabored.   ASSESSMENT & PLAN:    1. Chronic Diastolic CHF/RV Failure - Recent echo showed a preserved EF but she did have severely reduced RV function and RHC during admission showed mild pulmonary HTN. She has known OSA but has been intolerant to CPAP despite trying several different masks.  - Her breathing has been stable since discharge and weight has been stable on her home scales. Was elevated to 162 lbs prior to her recent admission and at 117 lbs on most recent check.  - Continue Torsemide 14m daily. I did ask them to stop her K-dur as K+ was elevated to 5.3 on 12/08/2019 and she is  still on Spironolactone. Scheduled for repeat labs next week.   2. Permanent Atrial Fibrillation - She denies any recent palpitations and HR has been well-controlled when checked at home. Not currently on AV nodal blocking agents (was on Toprol-XL during admission but discontinued in the interim due to soft BP).  - No reports of active bleeding. She remains on Coumadin  for anticoagulation.   3. Mechanical AVR  - Recent echo showed no evidence of aortic stenosis or regurgitation. She remains on Coumadin for anticoagulation.   4. Cirrhosis - She is scheduled to establish care with GI next month. Remains on Spironolactone 63m daily. Developed hyperkalemia with higher dosing and K-dur is being discontinued as outlined above.   5. AKI - Creatinine peaked at 2.37 during her recent admission, improved to 1.35 on 12/08/2019 which is close to her baseline.   COVID-19 Education: The signs and symptoms of COVID-19 were discussed with the patient and how to seek care for testing (follow up with PCP or arrange E-visit).  The importance of social distancing was discussed today.  Time:   Today, I have spent 27 minutes with the patient with telehealth technology discussing the above problems.     Medication Adjustments/Labs and Tests Ordered: Current medicines are reviewed at length with the patient today.  Concerns regarding medicines are outlined above.   Tests Ordered: No orders of the defined types were placed in this encounter.   Medication Changes: Meds ordered this encounter  Medications  . spironolactone (ALDACTONE) 50 MG tablet    Sig: Take 1 tablet (50 mg total) by mouth daily.    Follow Up:  In Person in 3 month(s)  Signed, BErma Heritage PHershal Coria 12/13/2019 9:26 AM    [place]

## 2019-12-12 NOTE — Progress Notes (Signed)
New Patient Visit  Assessment: Mary [name] is a 79 y.o. female with comminuted fracture of the right greater trochanter with minimal displacement  Plan: I had extensive discussion with Mary [name] in clinic today.  I reviewed the radiographs which demonstrates a comminuted fracture through the right greater trochanter with minimal displacement.  This is not near weightbearing surface of the hip, and we can therefore treat nonoperatively.  In addition, the patient is cachectic appearing, and does not appear to be doing well.  As such, any potential surgery would not be tolerated.  I have encouraged her to walk, using a walker and some guidance to prevent falls in order to improve her strength.  She should avoid hip extension, as well as hip abduction to minimize irritation, as well as the possibility of displacement.  I provided some instructions for in-home therapy so they can guide her ambulation appropriately.  All questions were answered they are amenable to the plan.  We will plan to see him back in 2 months.  Follow-up: Return in about 2 months (around 02/11/2020).  Please obtain an AP pelvis, and right hip x-rays prior to the next visit.  Subjective:  Chief Complaint  Patient presents with  . Hip Pain    right hip pain, had a fall Sunday.,     History of Present Illness: Mary [name] is a 79 y.o. female who was referred to clinic today by Steve Knowlton, MD for evaluation of right hip pain.  She presents with her [name] for evaluation of a right hip injury.  Briefly, she sustained a mechanical fall 5 days ago, landing directly onto her right hip.  X-rays and CT scan were obtained, and demonstrate a fracture of the greater trochanter on the right side.  She was recently hospitalized, and is convalescing at home with some in-home health therapy and was not interested in presenting to the emergency department for full evaluation.  Since then, she has continued to have some  discomfort in her hip.  She not taking medication specifically for her hip.  Her mobility is restricted at baseline, and she has been largely confined to a wheelchair or being pushed in a rolling walker.  Review of Systems: No fevers or chills No tingling  No chest pain  Medical History:  Past Medical History:  Diagnosis Date  . Anxiety   . Diabetes mellitus with neuropathy ([facility])   . Diabetic neuropathy ([facility])   . DJD (degenerative joint disease)   . H/O aortic valve replacement   . Hyperlipidemia   . Hypertension   . Neuromuscular disorder ([facility])    neuropathy in feet  . Obesity   . Sleep apnea   . Stroke ([facility])    " light stroke"  . Wears glasses     Past Surgical History:  Procedure Laterality Date  . ANKLE SURGERY     Left tendon repair  . AORTIC VALVE REPLACEMENT  03/1994  . CARDIAC CATHETERIZATION     [address]  . CERVICAL SPINE SURGERY    . COLONOSCOPY    . HEMORRHOID SURGERY    . IR PARACENTESIS  10/31/2019  . KNEE ARTHROSCOPY Right 04/13/2015   Procedure: RIGHT KNEE ARTHROSCOPY WITH DEBRIDEMENT AND PARTIAL MEDIAL MENISCECTOMY;  Surgeon: Mcarthur Rossetti, MD;  Location: [place];  Service: Orthopedics;  Laterality: Right;  . KNEE ARTHROSCOPY W/ MENISCECTOMY Right 04/13/2015  . LUMBAR FUSION    . RIGHT HEART CATH N/A 10/28/2019   Procedure:  RIGHT HEART CATH;  Surgeon: Leonie Man, MD;  Location: [place] [facility];  Service: Cardiovascular;  Laterality: N/A;    Family History  Problem Relation Age of Onset  . Pneumonia Sister   . Dementia Mother   . Diabetes Sister    Social History   Tobacco Use  . Smoking status: Never Smoker  . Smokeless tobacco: Never Used  Vaping Use  . Vaping Use: Never used  Substance Use Topics  . Alcohol use: No    Alcohol/week: 0.0 standard drinks  . Drug use: No    Allergies  Allergen Reactions  . Tape Itching    Redness, Please use "paper" tape only    Current Meds  Medication Sig  . acetaminophen  (TYLENOL) 500 MG tablet Take 500 mg by mouth 2 (two) times daily as needed for mild pain or moderate pain.   Marland Kitchen ALPRAZolam (XANAX) 0.5 MG tablet Take by mouth daily. In the mornings  . bisacodyl (DULCOLAX) 5 MG EC tablet Take 5 mg by mouth daily as needed for moderate constipation.  . cetirizine (ZYRTEC) 10 MG tablet Take 10 mg by mouth at bedtime as needed for allergies.   . fluticasone (FLONASE) 50 MCG/ACT nasal spray Place 1 spray into both nostrils at bedtime.   Marland Kitchen HYDROcodone-acetaminophen (NORCO) 10-325 MG tablet Take 1 tablet by mouth 2 (two) times daily.   Marland Kitchen lactulose (CHRONULAC) 10 GM/15ML solution Take 30 mLs (20 g total) by mouth daily.  . pantoprazole (PROTONIX) 40 MG tablet Take 1 tablet (40 mg total) by mouth daily before breakfast.  . potassium chloride (KLOR-CON) 10 MEQ tablet Take 10 mEq by mouth every morning.  . simvastatin (ZOCOR) 40 MG tablet Take 40 mg by mouth daily at 8 pm.   . vitamin B-12 (CYANOCOBALAMIN) 500 MCG tablet Take 500 mcg by mouth daily.  . Vitamin D, Ergocalciferol, (DRISDOL) 1.25 MG (50000 UT) CAPS capsule Take 50,000 Units by mouth every Sunday.   . warfarin (COUMADIN) 2 MG tablet Take 1 tablet daily or as directed by Coumadin clinic    Objective: BP 134/72   Pulse (!) 102   Physical Exam: Cachectic appearing elderly lady, seated in a wheelchair Overall very weak  Requires a great deal of assistance in order to stand up from her wheelchair and shift positioning. Shuffling gait  Evaluation of the right hip demonstrates minimal ecchymosis.  She does have some tenderness to palpation over the right greater trochanter. Decreased sensation over the dorsum of her right foot, which is her baseline She does tolerate gentle range of motion of the right hip including external rotation internal rotation and flexion.  Actively extends her right knee.    IMAGING: I personally reviewed the following images:  X-rays and CT scan of the right hip demonstrates  minimally displaced but comminuted fracture through the greater trochanter.  Fracture does not extend through the intertrochanteric region and does not involve the femoral neck.  No orders of the defined types were placed in this encounter.     Mordecai Rasmussen, MD  12/12/2019 11:34 AM

## 2019-12-13 ENCOUNTER — Encounter: Payer: Self-pay | Admitting: Student

## 2019-12-16 ENCOUNTER — Other Ambulatory Visit (HOSPITAL_COMMUNITY)
Admission: RE | Admit: 2019-12-16 | Discharge: 2019-12-16 | Disposition: A | Discharge status: Home / Self Care | Payer: Medicare Other | Source: Other Acute Inpatient Hospital | Attending: *Deleted | Admitting: *Deleted

## 2019-12-16 ENCOUNTER — Ambulatory Visit (INDEPENDENT_AMBULATORY_CARE_PROVIDER_SITE_OTHER): Payer: Medicare Other | Admitting: Pharmacist

## 2019-12-16 DIAGNOSIS — I359 Nonrheumatic aortic valve disorder, unspecified: Secondary | ICD-10-CM | POA: Diagnosis not present

## 2019-12-16 DIAGNOSIS — Z952 Presence of prosthetic heart valve: Secondary | ICD-10-CM | POA: Diagnosis not present

## 2019-12-16 LAB — POCT INR: INR: 3.1 — AB (ref 2.0–3.0)

## 2019-12-17 ENCOUNTER — Encounter (INDEPENDENT_AMBULATORY_CARE_PROVIDER_SITE_OTHER): Payer: Self-pay | Admitting: Gastroenterology

## 2019-12-22 ENCOUNTER — Other Ambulatory Visit (HOSPITAL_COMMUNITY)
Admission: AD | Admit: 2019-12-22 | Discharge: 2019-12-22 | Disposition: A | Discharge status: Home / Self Care | Payer: Medicare Other | Source: Other Acute Inpatient Hospital | Attending: Internal Medicine | Admitting: Internal Medicine

## 2019-12-22 DIAGNOSIS — N390 Urinary tract infection, site not specified: Secondary | ICD-10-CM | POA: Diagnosis not present

## 2019-12-22 DIAGNOSIS — E7220 Disorder of urea cycle metabolism, unspecified: Secondary | ICD-10-CM | POA: Diagnosis present

## 2019-12-22 LAB — AMMONIA: Ammonia: 92 umol/L — ABNORMAL HIGH (ref 9–35)

## 2019-12-29 ENCOUNTER — Telehealth: Payer: Self-pay | Admitting: *Deleted

## 2019-12-29 ENCOUNTER — Ambulatory Visit (INDEPENDENT_AMBULATORY_CARE_PROVIDER_SITE_OTHER): Payer: Medicare Other | Admitting: *Deleted

## 2019-12-29 DIAGNOSIS — Z952 Presence of prosthetic heart valve: Secondary | ICD-10-CM

## 2019-12-29 DIAGNOSIS — I4819 Other persistent atrial fibrillation: Secondary | ICD-10-CM | POA: Diagnosis not present

## 2019-12-29 DIAGNOSIS — I639 Cerebral infarction, unspecified: Secondary | ICD-10-CM

## 2019-12-29 LAB — POCT INR: INR: 2.9 (ref 2.0–3.0)

## 2019-12-29 NOTE — Telephone Encounter (Signed)
PT 34.5  INR 2.9  Per Otila Kluver [phone]

## 2019-12-29 NOTE — Telephone Encounter (Signed)
Done.  See coumadin note. 

## 2019-12-29 NOTE — Patient Instructions (Signed)
Continue warfarin 1 tablet daily Recheck in 2 weeks Order given to [place]

## 2020-01-04 ENCOUNTER — Emergency Department (HOSPITAL_COMMUNITY)
Admission: EM | Admit: 2020-01-04 | Discharge: 2020-01-04 | Disposition: A | Discharge status: Home / Self Care | Payer: Medicare Other | Attending: Emergency Medicine | Admitting: Emergency Medicine

## 2020-01-04 ENCOUNTER — Other Ambulatory Visit: Payer: Self-pay

## 2020-01-04 ENCOUNTER — Emergency Department (HOSPITAL_COMMUNITY): Payer: Medicare Other

## 2020-01-04 ENCOUNTER — Encounter (HOSPITAL_COMMUNITY): Payer: Self-pay | Admitting: Emergency Medicine

## 2020-01-04 DIAGNOSIS — W010XXA Fall on same level from slipping, tripping and stumbling without subsequent striking against object, initial encounter: Secondary | ICD-10-CM | POA: Diagnosis not present

## 2020-01-04 DIAGNOSIS — Y92017 Garden or yard in single-family (private) house as the place of occurrence of the external cause: Secondary | ICD-10-CM | POA: Insufficient documentation

## 2020-01-04 DIAGNOSIS — Z79899 Other long term (current) drug therapy: Secondary | ICD-10-CM | POA: Diagnosis not present

## 2020-01-04 DIAGNOSIS — Z7901 Long term (current) use of anticoagulants: Secondary | ICD-10-CM | POA: Insufficient documentation

## 2020-01-04 DIAGNOSIS — Z23 Encounter for immunization: Secondary | ICD-10-CM | POA: Diagnosis not present

## 2020-01-04 DIAGNOSIS — F0390 Unspecified dementia without behavioral disturbance: Secondary | ICD-10-CM | POA: Diagnosis not present

## 2020-01-04 DIAGNOSIS — E1122 Type 2 diabetes mellitus with diabetic chronic kidney disease: Secondary | ICD-10-CM | POA: Diagnosis not present

## 2020-01-04 DIAGNOSIS — I130 Hypertensive heart and chronic kidney disease with heart failure and stage 1 through stage 4 chronic kidney disease, or unspecified chronic kidney disease: Secondary | ICD-10-CM | POA: Diagnosis not present

## 2020-01-04 DIAGNOSIS — N183 Chronic kidney disease, stage 3 unspecified: Secondary | ICD-10-CM | POA: Insufficient documentation

## 2020-01-04 DIAGNOSIS — I5033 Acute on chronic diastolic (congestive) heart failure: Secondary | ICD-10-CM | POA: Insufficient documentation

## 2020-01-04 DIAGNOSIS — S81811A Laceration without foreign body, right lower leg, initial encounter: Secondary | ICD-10-CM | POA: Insufficient documentation

## 2020-01-04 DIAGNOSIS — S51011A Laceration without foreign body of right elbow, initial encounter: Secondary | ICD-10-CM | POA: Diagnosis present

## 2020-01-04 DIAGNOSIS — W19XXXA Unspecified fall, initial encounter: Secondary | ICD-10-CM

## 2020-01-04 LAB — PROTIME-INR
INR: 2.7 — ABNORMAL HIGH (ref 0.8–1.2)
Prothrombin Time: 27.7 seconds — ABNORMAL HIGH (ref 11.4–15.2)

## 2020-01-04 MED ORDER — TETANUS-DIPHTH-ACELL PERTUSSIS 5-2.5-18.5 LF-MCG/0.5 IM SUSP
0.5000 mL | Freq: Once | INTRAMUSCULAR | Status: AC
  Administered 2020-01-04: 0.5 mL via INTRAMUSCULAR
  Filled 2020-01-04: qty 0.5

## 2020-01-04 NOTE — ED Provider Notes (Signed)
[facility] EMERGENCY DEPARTMENT Provider Note   CSN: [id number] Arrival date & time: 01/04/20  9211     History Chief Complaint  Patient presents with  . Fall    Mary [name] is a 79 y.o. female.  HPI 79 year old female presents with a fall.  She states she tripped on her porch.  Multiple skin tears per EMS.  The patient tells me she hurts "everywhere, but more focally it seems to be her extremities.  Is not sure if she hit her head.  Past Medical History:  Diagnosis Date  . Anxiety   . Diabetes mellitus with neuropathy ([place])   . Diabetic neuropathy ([place])   . DJD (degenerative joint disease)   . H/O aortic valve replacement   . Hyperlipidemia   . Hypertension   . Neuromuscular disorder (HCC)    neuropathy in feet  . Obesity   . Sleep apnea   . Stroke [facility])    " light stroke"  . Wears glasses     Patient Active Problem List   Diagnosis Date Noted  . Other secondary pulmonary hypertension ([place]) 10/28/2019  . Acute on chronic combined systolic and diastolic CHF (congestive heart failure) ([place])   . Anasarca 04/25/2019  . Hypoalbuminemia 04/25/2019  . Persistent atrial fibrillation ([place]) 04/25/2019  . CKD (chronic kidney disease), stage III ([place]) 04/25/2019  . Anemia due to chronic kidney disease 04/25/2019  . Pressure injury of skin 04/24/2019  . Fall 04/23/2019  . S/P right knee arthroscopy 04/13/2015  . OSA (obstructive sleep apnea) 01/01/2013  . Diabetes ([place]) 10/23/2012  . Aortic valve disorder 06/30/2010  . CVA (cerebral vascular accident) ([place]) 06/30/2010  . S/P aortic valve replacement 06/30/2010  . Hyperlipidemia 09/29/2008  . Obesity 09/29/2008  . Essential hypertension 09/29/2008  . DEGENERATIVE JOINT DISEASE 09/29/2008    Past Surgical History:  Procedure Laterality Date  . ANKLE SURGERY     Left tendon repair  . AORTIC VALVE REPLACEMENT  03/1994  . CARDIAC CATHETERIZATION     1996 [facility]  . CERVICAL SPINE SURGERY    . COLONOSCOPY    .  HEMORRHOID SURGERY    . IR PARACENTESIS  10/31/2019  . KNEE ARTHROSCOPY Right 04/13/2015   Procedure: RIGHT KNEE ARTHROSCOPY WITH DEBRIDEMENT AND PARTIAL MEDIAL MENISCECTOMY;  Surgeon: Mcarthur Rossetti, MD;  Location: [facility];  Service: Orthopedics;  Laterality: Right;  . KNEE ARTHROSCOPY W/ MENISCECTOMY Right 04/13/2015  . LUMBAR FUSION    . RIGHT HEART CATH N/A 10/28/2019   Procedure: RIGHT HEART CATH;  Surgeon: Leonie Man, MD;  Location: [facility];  Service: Cardiovascular;  Laterality: N/A;     OB History   No obstetric history on file.     Family History  Problem Relation Age of Onset  . Pneumonia Sister   . Dementia Mother   . Diabetes Sister     Social History   Tobacco Use  . Smoking status: Never Smoker  . Smokeless tobacco: Never Used  Vaping Use  . Vaping Use: Never used  Substance Use Topics  . Alcohol use: No    Alcohol/week: 0.0 standard drinks  . Drug use: No    Home Medications Prior to Admission medications   Medication Sig Start Date End Date Taking? Authorizing Provider  acetaminophen (TYLENOL) 500 MG tablet Take 500 mg by mouth 2 (two) times daily as needed for mild pain or moderate pain.     [provider]  bisacodyl (DULCOLAX) 5 MG  EC tablet Take 5 mg by mouth daily as needed for moderate constipation.    [provider]  cetirizine (ZYRTEC) 10 MG tablet Take 10 mg by mouth at bedtime as needed for allergies.  02/16/15   [provider]  donepezil (ARICEPT) 5 MG tablet Take 5 mg by mouth daily. Patient not taking: Reported on 12/12/2019 12/02/19   [provider]  fluticasone (FLONASE) 50 MCG/ACT nasal spray Place 1 spray into both nostrils at bedtime.  01/13/15   [provider]  HYDROcodone-acetaminophen (NORCO) 10-325 MG tablet Take 1 tablet by mouth 2 (two) times daily.  12/18/15   [provider]  lactulose (CHRONULAC) 10 GM/15ML solution Take 30 mLs (20 g total) by mouth  daily. Patient not taking: Reported on 12/12/2019 11/03/19   Donne Hazel, MD  LORazepam (ATIVAN) 0.5 MG tablet Take 0.25 mg by mouth at bedtime.    [provider]  megestrol (MEGACE ES) 625 MG/5ML suspension Take 625 mg by mouth daily. Patient not taking: Reported on 12/12/2019 12/02/19   [provider]  pantoprazole (PROTONIX) 40 MG tablet Take 1 tablet (40 mg total) by mouth daily before breakfast. 03/21/16   Rehman, Mechele Dawley, MD  simvastatin (ZOCOR) 40 MG tablet Take 40 mg by mouth daily at 8 pm.  10/16/12   [provider]  spironolactone (ALDACTONE) 50 MG tablet Take 1 tablet (50 mg total) by mouth daily. 12/12/19 01/11/20  Strader, Fransisco Hertz, PA-C  torsemide (DEMADEX) 20 MG tablet Take 2 tablets (40 mg total) by mouth daily. Patient taking differently: Take 20 mg by mouth daily.  11/03/19 12/12/19  Donne Hazel, MD  vitamin B-12 (CYANOCOBALAMIN) 500 MCG tablet Take 500 mcg by mouth daily.    [provider]  Vitamin D, Ergocalciferol, (DRISDOL) 1.25 MG (50000 UT) CAPS capsule Take 50,000 Units by mouth every Sunday.     [provider]  warfarin (COUMADIN) 2 MG tablet Take 1 tablet daily or as directed by Coumadin clinic 08/28/19   Arnoldo Lenis, MD    Allergies    Tape  Review of Systems   Review of Systems  Unable to perform ROS: Dementia    Physical Exam Updated Vital Signs BP (!) 144/62   Pulse 99   Temp 98 F (36.7 C)   Resp 18   Ht 5\' 10"  (1.778 m)   Wt 65.8 kg   SpO2 99%   BMI 20.81 kg/m   Physical Exam Vitals and nursing note reviewed.  Constitutional:      Appearance: She is well-developed.  HENT:     Head: Normocephalic and atraumatic.     Right Ear: External ear normal.     Left Ear: External ear normal.     Nose: Nose normal.  Eyes:     General:        Right eye: No discharge.        Left eye: No discharge.  Cardiovascular:     Rate and Rhythm: Normal rate and regular rhythm.     Heart sounds: Normal  heart sounds.  Pulmonary:     Effort: Pulmonary effort is normal.     Breath sounds: Normal breath sounds.  Abdominal:     Palpations: Abdomen is soft.     Tenderness: There is no abdominal tenderness.  Musculoskeletal:     Comments: Superficial skin tears to left anterior lower leg, mild tenderness Multiple superficial skin tears to right forearm and right elbow. Most of these have  non salvageable  thin layer of skin  Skin:    General: Skin is warm and dry.  Neurological:     Mental Status: She is alert.     Comments: Awake, alert, but disoriented to time. Oriented to self and place. 5/5 strength in all 4 extremities.   Psychiatric:        Mood and Affect: Mood is not anxious.     ED Results / Procedures / Treatments   Labs (all labs ordered are listed, but only abnormal results are displayed) Labs Reviewed  PROTIME-INR - Abnormal; Notable for the following components:      Result Value   Prothrombin Time 27.7 (*)    INR 2.7 (*)    All other components within normal limits    EKG None  Radiology DG Elbow Complete Right  Result Date: 01/04/2020 CLINICAL DATA:  Fall, arm pain, skin tears EXAM: RIGHT ELBOW - COMPLETE 3+ VIEW COMPARISON:  None. FINDINGS: No acute bony abnormality. Specifically, no fracture, subluxation, or dislocation. No joint effusion. Soft tissues intact. IMPRESSION: No bony abnormality. Electronically Signed   By: Rolm Baptise M.D.   On: 01/04/2020 22:09   DG Forearm Right  Result Date: 01/04/2020 CLINICAL DATA:  Fall, pain EXAM: RIGHT FOREARM - 2 VIEW COMPARISON:  None. FINDINGS: There is no evidence of fracture or other focal bone lesions. Soft tissues are unremarkable. IMPRESSION: Negative. Electronically Signed   By: Rolm Baptise M.D.   On: 01/04/2020 22:10   DG Tibia/Fibula Left  Result Date: 01/04/2020 CLINICAL DATA:  [place] in the yard this afternoon. Pain and skin tears. EXAM: LEFT TIBIA AND FIBULA - 2 VIEW COMPARISON:  Left knee 07/21/2013  FINDINGS: There is no evidence of fracture or other focal bone lesions. Soft tissues are unremarkable. Prominent vascular calcifications. IMPRESSION: No acute bony abnormalities. Electronically Signed   By: Lucienne Capers M.D.   On: 01/04/2020 22:11   CT Head Wo Contrast  Result Date: 01/04/2020 CLINICAL DATA:  79 year old female with head trauma. EXAM: CT HEAD WITHOUT CONTRAST CT CERVICAL SPINE WITHOUT CONTRAST TECHNIQUE: Multidetector CT imaging of the head and cervical spine was performed following the standard protocol without intravenous contrast. Multiplanar CT image reconstructions of the cervical spine were also generated. COMPARISON:  Head CT dated 03/02/2019. FINDINGS: Evaluation of this exam is limited due to motion artifact. CT HEAD FINDINGS Brain: There is moderate age-related atrophy and chronic microvascular ischemic changes. There is no acute intracranial hemorrhage. No mass effect or midline shift. No extra-axial fluid collection. Vascular: No hyperdense vessel or unexpected calcification. Skull: Normal. Negative for fracture or focal lesion. Sinuses/Orbits: No acute finding. Other: None CT CERVICAL SPINE FINDINGS Alignment: No acute subluxation. Skull base and vertebrae: No acute fracture.  Osteopenia. Soft tissues and spinal canal: No prevertebral fluid or swelling. No visible canal hematoma. Disc levels: Multilevel degenerative changes. There is C5-C7 bony fusion. Upper chest: Partially visualized 6 mm nodule in the left upper lobe. Dedicated chest CT may provide better evaluation on a nonemergent/outpatient basis. Other: Bilateral carotid bulb calcified plaques. IMPRESSION: 1. No acute intracranial pathology. Moderate age-related atrophy and chronic microvascular ischemic changes. 2. No acute/traumatic cervical spine pathology. 3. A 6 mm left upper lobe nodule. Further evaluation with chest CT on a nonemergent/outpatient basis recommended. Electronically Signed   By: Anner Crete M.D.    On: 01/04/2020 21:54   CT Cervical Spine Wo Contrast  Result Date: 01/04/2020 CLINICAL DATA:  79 year old female with head trauma. EXAM: CT HEAD WITHOUT  CONTRAST CT CERVICAL SPINE WITHOUT CONTRAST TECHNIQUE: Multidetector CT imaging of the head and cervical spine was performed following the standard protocol without intravenous contrast. Multiplanar CT image reconstructions of the cervical spine were also generated. COMPARISON:  Head CT dated 03/02/2019. FINDINGS: Evaluation of this exam is limited due to motion artifact. CT HEAD FINDINGS Brain: There is moderate age-related atrophy and chronic microvascular ischemic changes. There is no acute intracranial hemorrhage. No mass effect or midline shift. No extra-axial fluid collection. Vascular: No hyperdense vessel or unexpected calcification. Skull: Normal. Negative for fracture or focal lesion. Sinuses/Orbits: No acute finding. Other: None CT CERVICAL SPINE FINDINGS Alignment: No acute subluxation. Skull base and vertebrae: No acute fracture.  Osteopenia. Soft tissues and spinal canal: No prevertebral fluid or swelling. No visible canal hematoma. Disc levels: Multilevel degenerative changes. There is C5-C7 bony fusion. Upper chest: Partially visualized 6 mm nodule in the left upper lobe. Dedicated chest CT may provide better evaluation on a nonemergent/outpatient basis. Other: Bilateral carotid bulb calcified plaques. IMPRESSION: 1. No acute intracranial pathology. Moderate age-related atrophy and chronic microvascular ischemic changes. 2. No acute/traumatic cervical spine pathology. 3. A 6 mm left upper lobe nodule. Further evaluation with chest CT on a nonemergent/outpatient basis recommended. Electronically Signed   By: Anner Crete M.D.   On: 01/04/2020 21:54    Procedures .Marland KitchenLaceration Repair  Date/Time: 01/04/2020 11:21 PM Performed by: Sherwood Gambler, MD Authorized by: Sherwood Gambler, MD   Laceration details:    Location:   Shoulder/arm   Shoulder/arm location:  R elbow   Length (cm):  4 Repair type:    Repair type:  Simple Skin repair:    Repair method:  Steri-Strips   Number of Steri-Strips:  4 Approximation:    Approximation:  Loose Post-procedure details:    Patient tolerance of procedure:  Tolerated well, no immediate complications .Marland KitchenLaceration Repair  Date/Time: 01/04/2020 11:21 PM Performed by: Sherwood Gambler, MD Authorized by: Sherwood Gambler, MD   Laceration details:    Location:  Leg   Leg location:  R lower leg   Length (cm):  3 Repair type:    Repair type:  Simple Skin repair:    Repair method:  Steri-Strips   Number of Steri-Strips:  3   (including critical care time)  Medications Ordered in ED Medications  Tdap (BOOSTRIX) injection 0.5 mL (0.5 mLs Intramuscular Given 01/04/20 2058)    ED Course  I have reviewed the triage vital signs and the nursing notes.  Pertinent labs & imaging results that were available during my care of the patient were reviewed by me and considered in my medical decision making (see chart for details).    MDM Rules/Calculators/A&P                          Patient presents after a fall at home.  She has some dementia but husband was able to confirm over the phone that she tripped and fell.  Given she is on Coumadin, INR checked and CT head/C-spine obtained.  These are negative.  Extremity x-rays are negative as well.  No hip pain or tenderness.  She has multiple skin tears but only a couple that have been of skin to reapproximate.  These have been addressed with Steri-Strips.  Will give tetanus immunization. Final Clinical Impression(s) / ED Diagnoses Final diagnoses:  Fall, initial encounter  Skin tear of right elbow without complication, initial encounter    Rx / DC Orders ED Discharge  Orders    None       Sherwood Gambler, MD 01/04/20 2322

## 2020-01-04 NOTE — ED Triage Notes (Signed)
Pt arrives via [facility] after falling in the yard this afternoon. Pt states she has pain all over but that is normal for her. Denies of hitting her head. Pt has several skin tears on right arm with bleeding controlled.

## 2020-01-05 ENCOUNTER — Ambulatory Visit (INDEPENDENT_AMBULATORY_CARE_PROVIDER_SITE_OTHER): Payer: Medicare Other | Admitting: Gastroenterology

## 2020-01-05 ENCOUNTER — Telehealth: Payer: Self-pay | Admitting: *Deleted

## 2020-01-05 NOTE — Progress Notes (Signed)
Cardiology Office Note  Date: 01/07/2020   ID: Mary [name] May 28, 1940, MRN [id number]  PCP:  Mary Evens, MD  Cardiologist:  Mary Dolly, MD Electrophysiologist:  None   Chief Complaint: Chronic diastolic heart failure  History of Present Illness: Mary [name] is a 79 y.o. female with a history of persistent atrial fibrillation, chronic diastolic CHF, recurrent pleural effusions, mechanical aortic valve replacement 1995 on anticoagulation, HTN, HLD, OSA, cirrhosis, orthostatic hypotension, history of CVA, DM type II.  Previously saw Mary [name] August 2021.  Taking torsemide p.o. twice daily along with metolazone 10 mg with increasing weight, dyspnea, abdominal distention.Marland Kitchen  She was admitted and underwent paracentesis October 24, 2019 with 4.2 L of fluid removed.  Echo with EF of 55-60 but severely reduced RV function.  Was given IV Lasix 80 mg p.o. twice daily.  Transferred to Mary [name] for right heart cath 10/28/2019 findings consistent with mild pulmonary hypertension.  Subsequently underwent second paracentesis on 10/31/2019 with 4.2 L of fluid removed.  Transitioned to torsemide 40 mg daily along with spironolactone 100 mg daily.  Discharge weight 124 pounds on November 01, 2019.  PCP reduced spironolactone to 50 mg daily and she was taking torsemide 20 mg daily.  Recent encounter with Mary Pho, PA 12/12/2019 via telemedicine visit.  She had recently experienced a fall, with comminuted fracture of right greater trochanter with minimal displacement she was scheduled to see orthopedics that same day.  Patient's husband reported her breathing had been doing well since hospital discharge.  No orthopnea, PND, or lower extremity edema, palpitations or chest pain.  Husband had stopped her Aricept due to symptoms of diarrhea.  Her weight on most recent check was 117 pounds.  She was continued on torsemide 20 mg daily.  K-Dur was stopped due to elevated potassium of 5.3.  She  continues spironolactone.  She denied any palpitations and heart rate was well controlled.  Her beta-blocker had been stopped during hospital admission and discontinued due to soft blood pressures.  She was scheduled to see GI the following month.  Her acute kidney injury resolved with a peak creatinine of 2.3 during hospitalization which improved to 1.35 on 12/08/2019.  Recent emergency room visit for fall 01/04/2020.  She was on her front porch and tripped and suffered multiple skin tears on her legs.  She had multiple x-rays with CT of her head, cervical spine, elbow, forearm, tibia-fibula.  CT of the head showed moderate age-related atrophy and chronic microvascular ischemic changes.  Incidental 6 mm left upper lobe nodule.  Further evaluation with CT chest was recommended on a nonemergent basis.  No fractures were noted to neck, elbow, forearm, or tibiofibular,  She arrives today in a wheelchair.  She has dressings to both legs from skin tears, and left elbow.  States she does not feel bad.  However she looks cachectic and under nourished. Appears mildly agitated /confused. Husband had stopped her Aricept d/t diarrhea. Husband states she was given Megace for appetite but he has decreased/stopped temporarily due to side effects he read about.  Her abdomen is not swollen today.  She has no edema in  lower extremities.  No shortness of breath at rest or complaints of dyspnea.  She does have liver cirrhosis with elevated ammonia level on 12/22/2019 of 92.  On 12/08/2019 she had a sodium of 130, potassium 5.3, glucose 172, creatinine of 1.35, GFR 37.  Hemoglobin 11.4, INR of 2.3.  Her weight today was 114.  Her discharge weight after last hospital stay was 117.  She currently denies any dyspnea on exertion, orthostatic symptoms, palpitations or arrhythmias, CVA or TIA-like symptoms.   Past Medical History:  Diagnosis Date  . Anxiety   . Diabetes mellitus with neuropathy ([place])   . Diabetic neuropathy ([place])     . DJD (degenerative joint disease)   . H/O aortic valve replacement   . Hyperlipidemia   . Hypertension   . Neuromuscular disorder (HCC)    neuropathy in feet  . Obesity   . Sleep apnea   . Stroke [facility])    " light stroke"  . Wears glasses     Past Surgical History:  Procedure Laterality Date  . ANKLE SURGERY     Left tendon repair  . AORTIC VALVE REPLACEMENT  03/1994  . CARDIAC CATHETERIZATION     1996 [facility]  . CERVICAL SPINE SURGERY    . COLONOSCOPY    . HEMORRHOID SURGERY    . IR PARACENTESIS  10/31/2019  . KNEE ARTHROSCOPY Right 04/13/2015   Procedure: RIGHT KNEE ARTHROSCOPY WITH DEBRIDEMENT AND PARTIAL MEDIAL MENISCECTOMY;  Surgeon: Mcarthur Rossetti, MD;  Location: [facility];  Service: Orthopedics;  Laterality: Right;  . KNEE ARTHROSCOPY W/ MENISCECTOMY Right 04/13/2015  . LUMBAR FUSION    . RIGHT HEART CATH N/A 10/28/2019   Procedure: RIGHT HEART CATH;  Surgeon: Leonie Man, MD;  Location: [facility] CV [facility];  Service: Cardiovascular;  Laterality: N/A;    Current Outpatient Medications  Medication Sig Dispense Refill  . acetaminophen (TYLENOL) 500 MG tablet Take 500 mg by mouth 2 (two) times daily as needed for mild pain or moderate pain.     . bisacodyl (DULCOLAX) 5 MG EC tablet Take 5 mg by mouth daily as needed for moderate constipation.    . cetirizine (ZYRTEC) 10 MG tablet Take 10 mg by mouth at bedtime as needed for allergies.   11  . fluticasone (FLONASE) 50 MCG/ACT nasal spray Place 1 spray into both nostrils as needed.   11  . HYDROcodone-acetaminophen (NORCO) 10-325 MG tablet Take 1 tablet by mouth 2 (two) times daily.   0  . lactulose (CHRONULAC) 10 GM/15ML solution Take 30 mLs (20 g total) by mouth daily. 236 mL 0  . LORazepam (ATIVAN) 0.5 MG tablet Take 0.25 mg by mouth at bedtime.    . pantoprazole (PROTONIX) 40 MG tablet Take 1 tablet (40 mg total) by mouth daily before breakfast. 30 tablet 5  . simvastatin (ZOCOR) 40 MG tablet Take 40 mg by  mouth daily at 8 pm.     . spironolactone (ALDACTONE) 50 MG tablet Take 1 tablet (50 mg total) by mouth daily.    Marland Kitchen torsemide (DEMADEX) 20 MG tablet Take 20 mg by mouth daily.    . vitamin B-12 (CYANOCOBALAMIN) 500 MCG tablet Take 500 mcg by mouth daily.    . Vitamin D, Ergocalciferol, (DRISDOL) 1.25 MG (50000 UT) CAPS capsule Take 50,000 Units by mouth every Sunday.     . warfarin (COUMADIN) 2 MG tablet Take 1 tablet daily or as directed by Coumadin clinic 100 tablet 1   No current facility-administered medications for this visit.   Allergies:  Tape   Social History: The patient  reports that she has never smoked. She has never used smokeless tobacco. She reports that she does not drink alcohol and does not use drugs.   Family History: The patient's family history includes Dementia in her mother; Diabetes in her  sister; Pneumonia in her sister.   ROS:  Please see the history of present illness. Otherwise, complete review of systems is positive for none.  All other systems are reviewed and negative.   Physical Exam: VS:  BP (!) 114/58   Pulse 99   Ht 5\' 5"  (1.651 m)   Wt 114 lb (51.7 kg) Comment: this past sat at home  SpO2 99%   BMI 18.97 kg/m , BMI Body mass index is 18.97 kg/m.  Wt Readings from Last 3 Encounters:  01/06/20 114 lb (51.7 kg)  01/04/20 145 lb (65.8 kg)  12/12/19 117 lb (53.1 kg)    General: Patient appears comfortable at rest. Neck: Supple, no elevated JVP or carotid bruits, no thyromegaly. Lungs: Clear to auscultation, nonlabored breathing at rest. Cardiac: Irregularly irregular rate and rhythm, no S3 or significant systolic murmur, no pericardial rub. Extremities: No pitting edema, distal pulses 2+. Skin: Warm and dry. Musculoskeletal: No kyphosis. Neuropsychiatric: Alert and oriented x3, affect grossly appropriate.  ECG:  October 23, 2019 EKG atrial fibrillation rate of 68  Recent Labwork: 10/23/2019: B Natriuretic Peptide 565.0 10/25/2019: TSH  2.951 10/29/2019: Magnesium 2.5 12/08/2019: ALT 19; AST 31; BUN 37; Creatinine, Ser 1.35; Hemoglobin 11.4; Platelets 171; Potassium 5.3; Sodium 130     Component Value Date/Time   CHOL 67 10/25/2019 0607   TRIG 61 10/25/2019 0607   HDL 28 (L) 10/25/2019 0607   CHOLHDL 2.4 10/25/2019 0607   VLDL 12 10/25/2019 0607   LDLCALC 27 10/25/2019 0607    Other Studies Reviewed Today:   Echocardiogram: 10/2019 IMPRESSIONS  1. Left ventricular ejection fraction, by estimation, is 55 to 60%. The  left ventricle has normal function. The left ventricle has no regional  wall motion abnormalities. Left ventricular diastolic parameters are  indeterminate.  2. Right ventricular systolic function is severely reduced. The right  ventricular size is severely enlarged. There is moderately elevated  pulmonary artery systolic pressure.  3. Left atrial size was moderately dilated.  4. Right atrial size was severely dilated.  5. The mitral valve is normal in structure. Mild mitral valve  regurgitation. No evidence of mitral stenosis.  6. The tricuspid valve is abnormal. Tricuspid valve regurgitation is  severe.  7. There is a mechanical valve in the AV position. From medical record St  Jude valve, unknown type and size. . The aortic valve has been  repaired/replaced. Aortic valve regurgitation is not visualized. No aortic  stenosis is present.  8. Severe pulmonary HTN, PASP is 65 mmHg.  9. The inferior vena cava is dilated in size with <50% respiratory  variability, suggesting right atrial pressure of 15 mmHg.   RHC: 10/2019  Hemodynamic findings consistent with mild pulmonary hypertension.  Large V wave on PCWP waveform-suggestive of mitral valve disease.  Mild Pulmonary Hypertension - (TPG 11 mmHg) The waveform on PCWP suggest much disease, not seen on echo  Assessment and Plan:  1. Chronic diastolic heart failure ([place])   2. Permanent atrial fibrillation ([place])   3. H/O mechanical  aortic valve replacement   4. Cirrhosis of liver with ascites, unspecified hepatic cirrhosis type ([place])   5. Severe pulmonary hypertension (HCC)   6. Lung nodule    1. Chronic diastolic heart failure ([place]) Echo 10/2019 EF 55 to 60%.  No WMA's, severe TR, mechanical valve in AV position, severe pulmonary hypertension, PASP 65 mmHg.  Lungs are clear to auscultation.  She has no abdominal distention or bloating.  No lower extremity edema.  Weight is stable at 114 today.  Continue torsemide 20 mg daily, spironolactone 50 mg daily,  2. Permanent atrial fibrillation [facility]) EKG October 23, 2019 atrial fibrillation with rate of 68. Pulse 99 and irregularly irregular today. Not on AV nodal blockers at this time. Continue Coumadin 2 mg as directed  3. H/O mechanical aortic valve replacement AVR 1996. Echo August 2021: Mechanical valve in the AV position from medical record [facility] valve unknown type and size.  4. Cirrhosis of liver with ascites, unspecified hepatic cirrhosis type ([place]) Recently had paracentesis x 2 with 4.2 L of fluid removed each time. Elevated Ammonia level at 92 on 12/22/2019. Follow with PCP. May need to be referred to GI for mgmt of Cirrhosis. Will defer to PCP.  5. Severe Pulmonary Hypertension Recent echo August 2021 showed severely reduced RV systolic function.  RV severely enlarged.  Severe pulmonary hypertension, PASP 65 mmHg.   6. Lung nodule left upper lobe 6 mm Found on recent imaging. Provider recommended CT follow up on outpatient basis.   Medication Adjustments/Labs and Tests Ordered: Current medicines are reviewed at length with the patient today.  Concerns regarding medicines are outlined above.   Disposition: Follow-up with Dr Harl [name] or APP 6 months  Signed, Levell July, NP 01/07/2020 11:47 PM    [facility] at [place], [place], [place] [address] Phone: [phone]; Fax: [phone]

## 2020-01-05 NOTE — Telephone Encounter (Signed)
Pt c/o chest pain/discomfort with SOB since this morning HR 92 BP 100/56 rating pain 9/10 - denies dizziness/swelling/palpitations - aware that with severe chest pain as described pt should have ED evolution - pt declined to go back to ED at this time wanted appt - scheduled for tomorrow at 930 with Katina Dung, NP and aware that if symptoms do not improve or worsen that pt should have evaluation at ED - pt voiced understanding

## 2020-01-06 ENCOUNTER — Ambulatory Visit (INDEPENDENT_AMBULATORY_CARE_PROVIDER_SITE_OTHER): Payer: Medicare Other | Admitting: Family Medicine

## 2020-01-06 ENCOUNTER — Encounter: Payer: Self-pay | Admitting: Family Medicine

## 2020-01-06 VITALS — BP 114/58 | HR 99 | Ht 65.0 in | Wt 114.0 lb

## 2020-01-06 DIAGNOSIS — Z952 Presence of prosthetic heart valve: Secondary | ICD-10-CM

## 2020-01-06 DIAGNOSIS — I5032 Chronic diastolic (congestive) heart failure: Secondary | ICD-10-CM | POA: Diagnosis not present

## 2020-01-06 DIAGNOSIS — R911 Solitary pulmonary nodule: Secondary | ICD-10-CM

## 2020-01-06 DIAGNOSIS — K7460 Unspecified cirrhosis of liver: Secondary | ICD-10-CM | POA: Diagnosis not present

## 2020-01-06 DIAGNOSIS — I4821 Permanent atrial fibrillation: Secondary | ICD-10-CM

## 2020-01-06 DIAGNOSIS — R188 Other ascites: Secondary | ICD-10-CM

## 2020-01-06 DIAGNOSIS — I272 Pulmonary hypertension, unspecified: Secondary | ICD-10-CM

## 2020-01-06 NOTE — Patient Instructions (Signed)
Medication Instructions:  Continue all current medications.   Labwork: none  Testing/Procedures: none  Follow-Up: 6 months   Any Other Special Instructions Will Be Listed Below (If Applicable).   If you need a refill on your cardiac medications before your next appointment, please call your pharmacy.  

## 2020-01-07 ENCOUNTER — Encounter: Payer: Self-pay | Admitting: Family Medicine

## 2020-01-09 ENCOUNTER — Ambulatory Visit (INDEPENDENT_AMBULATORY_CARE_PROVIDER_SITE_OTHER): Payer: Medicare Other | Admitting: Pharmacist

## 2020-01-09 ENCOUNTER — Telehealth: Payer: Self-pay | Admitting: Cardiology

## 2020-01-09 DIAGNOSIS — I639 Cerebral infarction, unspecified: Secondary | ICD-10-CM

## 2020-01-09 DIAGNOSIS — Z952 Presence of prosthetic heart valve: Secondary | ICD-10-CM | POA: Diagnosis not present

## 2020-01-09 DIAGNOSIS — I359 Nonrheumatic aortic valve disorder, unspecified: Secondary | ICD-10-CM

## 2020-01-09 LAB — POCT INR: INR: 3 (ref 2.0–3.0)

## 2020-01-09 NOTE — Telephone Encounter (Signed)
Spoke with Whitney at [phone].  Continue warfarin 2 mg daily, recheck in 2 weeks.  Please see anticoag encounter

## 2020-01-09 NOTE — Patient Instructions (Signed)
Description   Continue warfarin 1 tablet daily Recheck in 2 weeks Order given to Juliane Poot RN [facility]

## 2020-01-09 NOTE — Telephone Encounter (Signed)
Whitney, patient's home health nurse, states the patients INR is 3.0

## 2020-01-12 ENCOUNTER — Telehealth: Payer: Self-pay | Admitting: *Deleted

## 2020-01-12 NOTE — Telephone Encounter (Signed)
Husband notified.  Stated that she was in the bathroom & needed his help - "don't have time to fool with ya".  Encouraged him to call back at a later time when he could discuss message.  Upset that she has not seen Dr. Harl Bowie the last 2 visits she had in office.

## 2020-01-12 NOTE — Telephone Encounter (Signed)
Recall already in system for 06/2020 with Dr. Harl Bowie.

## 2020-01-12 NOTE — Telephone Encounter (Signed)
-----   Message from Verta Ellen., NP sent at 01/08/2020 12:01 AM EDT ----- Regarding: Lung nodule and pulmonary hypertension Will you call patient's husband and tell him we could refer her to the hypertension clinic for her severe pulmonary hypertension so they could try to treat it and help her breathing a little. During her last hospital visit they noticed a lung nodule in her left lung. They recommended she get an outpatient CT of her chest. However I think she should probably see a lung specialist, maybe Dr Elsworth Soho, so he can look at her and decide whether she needs any further evaluation. She has so many problems and wanted to run it by him before we made any referrals. Thanks

## 2020-01-12 NOTE — Telephone Encounter (Signed)
Make sure she sees Dr Harl Bowie next visit. I dont see the rest of the message

## 2020-01-14 ENCOUNTER — Ambulatory Visit (HOSPITAL_COMMUNITY)
Admission: RE | Admit: 2020-01-14 | Discharge: 2020-01-14 | Disposition: A | Discharge status: Home / Self Care | Payer: Medicare Other | Source: Ambulatory Visit | Attending: Nurse Practitioner | Admitting: Nurse Practitioner

## 2020-01-14 ENCOUNTER — Other Ambulatory Visit (HOSPITAL_COMMUNITY): Payer: Self-pay | Admitting: Nurse Practitioner

## 2020-01-14 ENCOUNTER — Other Ambulatory Visit: Payer: Self-pay

## 2020-01-14 DIAGNOSIS — E875 Hyperkalemia: Secondary | ICD-10-CM | POA: Diagnosis not present

## 2020-01-14 DIAGNOSIS — R0781 Pleurodynia: Secondary | ICD-10-CM

## 2020-01-14 DIAGNOSIS — W19XXXD Unspecified fall, subsequent encounter: Secondary | ICD-10-CM

## 2020-01-14 DIAGNOSIS — N179 Acute kidney failure, unspecified: Secondary | ICD-10-CM | POA: Diagnosis not present

## 2020-01-14 DIAGNOSIS — W19XXXA Unspecified fall, initial encounter: Secondary | ICD-10-CM | POA: Insufficient documentation

## 2020-01-15 ENCOUNTER — Emergency Department (HOSPITAL_COMMUNITY): Payer: Medicare Other

## 2020-01-15 ENCOUNTER — Other Ambulatory Visit: Payer: Self-pay

## 2020-01-15 ENCOUNTER — Encounter (HOSPITAL_COMMUNITY): Payer: Self-pay | Admitting: Emergency Medicine

## 2020-01-15 ENCOUNTER — Inpatient Hospital Stay (HOSPITAL_COMMUNITY)
Admission: EM | Admit: 2020-01-15 | Discharge: 2020-01-17 | DRG: 683 | Payer: Medicare Other | Attending: Internal Medicine | Admitting: Internal Medicine

## 2020-01-15 DIAGNOSIS — E1169 Type 2 diabetes mellitus with other specified complication: Secondary | ICD-10-CM

## 2020-01-15 DIAGNOSIS — I5042 Chronic combined systolic (congestive) and diastolic (congestive) heart failure: Secondary | ICD-10-CM | POA: Diagnosis present

## 2020-01-15 DIAGNOSIS — I480 Paroxysmal atrial fibrillation: Secondary | ICD-10-CM | POA: Diagnosis present

## 2020-01-15 DIAGNOSIS — Y92007 Garden or yard of unspecified non-institutional (private) residence as the place of occurrence of the external cause: Secondary | ICD-10-CM | POA: Diagnosis not present

## 2020-01-15 DIAGNOSIS — R296 Repeated falls: Secondary | ICD-10-CM | POA: Diagnosis present

## 2020-01-15 DIAGNOSIS — E1122 Type 2 diabetes mellitus with diabetic chronic kidney disease: Secondary | ICD-10-CM | POA: Diagnosis present

## 2020-01-15 DIAGNOSIS — Z20822 Contact with and (suspected) exposure to covid-19: Secondary | ICD-10-CM | POA: Diagnosis present

## 2020-01-15 DIAGNOSIS — R0600 Dyspnea, unspecified: Secondary | ICD-10-CM

## 2020-01-15 DIAGNOSIS — Z91048 Other nonmedicinal substance allergy status: Secondary | ICD-10-CM

## 2020-01-15 DIAGNOSIS — E785 Hyperlipidemia, unspecified: Secondary | ICD-10-CM | POA: Diagnosis present

## 2020-01-15 DIAGNOSIS — R4182 Altered mental status, unspecified: Secondary | ICD-10-CM

## 2020-01-15 DIAGNOSIS — N1832 Chronic kidney disease, stage 3b: Secondary | ICD-10-CM | POA: Diagnosis present

## 2020-01-15 DIAGNOSIS — E1140 Type 2 diabetes mellitus with diabetic neuropathy, unspecified: Secondary | ICD-10-CM | POA: Diagnosis present

## 2020-01-15 DIAGNOSIS — N183 Chronic kidney disease, stage 3 unspecified: Secondary | ICD-10-CM | POA: Diagnosis not present

## 2020-01-15 DIAGNOSIS — Z79899 Other long term (current) drug therapy: Secondary | ICD-10-CM

## 2020-01-15 DIAGNOSIS — I639 Cerebral infarction, unspecified: Secondary | ICD-10-CM | POA: Diagnosis present

## 2020-01-15 DIAGNOSIS — Z7901 Long term (current) use of anticoagulants: Secondary | ICD-10-CM | POA: Diagnosis not present

## 2020-01-15 DIAGNOSIS — I2729 Other secondary pulmonary hypertension: Secondary | ICD-10-CM | POA: Diagnosis present

## 2020-01-15 DIAGNOSIS — F419 Anxiety disorder, unspecified: Secondary | ICD-10-CM | POA: Diagnosis present

## 2020-01-15 DIAGNOSIS — I10 Essential (primary) hypertension: Secondary | ICD-10-CM | POA: Diagnosis present

## 2020-01-15 DIAGNOSIS — E875 Hyperkalemia: Secondary | ICD-10-CM | POA: Diagnosis present

## 2020-01-15 DIAGNOSIS — N179 Acute kidney failure, unspecified: Secondary | ICD-10-CM | POA: Diagnosis present

## 2020-01-15 DIAGNOSIS — Z9181 History of falling: Secondary | ICD-10-CM

## 2020-01-15 DIAGNOSIS — R0689 Other abnormalities of breathing: Secondary | ICD-10-CM

## 2020-01-15 DIAGNOSIS — I130 Hypertensive heart and chronic kidney disease with heart failure and stage 1 through stage 4 chronic kidney disease, or unspecified chronic kidney disease: Secondary | ICD-10-CM | POA: Diagnosis present

## 2020-01-15 DIAGNOSIS — Z952 Presence of prosthetic heart valve: Secondary | ICD-10-CM

## 2020-01-15 DIAGNOSIS — Z9119 Patient's noncompliance with other medical treatment and regimen: Secondary | ICD-10-CM

## 2020-01-15 DIAGNOSIS — E119 Type 2 diabetes mellitus without complications: Secondary | ICD-10-CM

## 2020-01-15 DIAGNOSIS — R269 Unspecified abnormalities of gait and mobility: Secondary | ICD-10-CM | POA: Diagnosis present

## 2020-01-15 DIAGNOSIS — Z833 Family history of diabetes mellitus: Secondary | ICD-10-CM

## 2020-01-15 DIAGNOSIS — W19XXXA Unspecified fall, initial encounter: Secondary | ICD-10-CM | POA: Diagnosis present

## 2020-01-15 DIAGNOSIS — Z8673 Personal history of transient ischemic attack (TIA), and cerebral infarction without residual deficits: Secondary | ICD-10-CM | POA: Diagnosis not present

## 2020-01-15 DIAGNOSIS — N189 Chronic kidney disease, unspecified: Secondary | ICD-10-CM | POA: Diagnosis present

## 2020-01-15 DIAGNOSIS — G4733 Obstructive sleep apnea (adult) (pediatric): Secondary | ICD-10-CM | POA: Diagnosis present

## 2020-01-15 DIAGNOSIS — D631 Anemia in chronic kidney disease: Secondary | ICD-10-CM | POA: Diagnosis present

## 2020-01-15 LAB — COMPREHENSIVE METABOLIC PANEL
ALT: 15 U/L (ref 0–44)
AST: 29 U/L (ref 15–41)
Albumin: 3.4 g/dL — ABNORMAL LOW (ref 3.5–5.0)
Alkaline Phosphatase: 90 U/L (ref 38–126)
Anion gap: 9 (ref 5–15)
BUN: 45 mg/dL — ABNORMAL HIGH (ref 8–23)
CO2: 18 mmol/L — ABNORMAL LOW (ref 22–32)
Calcium: 9.1 mg/dL (ref 8.9–10.3)
Chloride: 107 mmol/L (ref 98–111)
Creatinine, Ser: 2.01 mg/dL — ABNORMAL HIGH (ref 0.44–1.00)
GFR, Estimated: 24 mL/min — ABNORMAL LOW (ref >60)
Glucose, Bld: 80 mg/dL (ref 70–99)
Potassium: 5.4 mmol/L — ABNORMAL HIGH (ref 3.5–5.1)
Sodium: 134 mmol/L — ABNORMAL LOW (ref 135–145)
Total Bilirubin: 1.2 mg/dL (ref 0.3–1.2)
Total Protein: 8.1 g/dL (ref 6.5–8.1)

## 2020-01-15 LAB — CBC WITH DIFFERENTIAL/PLATELET
Abs Immature Granulocytes: 0.02 10*3/uL (ref 0.00–0.07)
Basophils Absolute: 0.1 10*3/uL (ref 0.0–0.1)
Basophils Relative: 1 %
Eosinophils Absolute: 0.8 10*3/uL — ABNORMAL HIGH (ref 0.0–0.5)
Eosinophils Relative: 12 %
HCT: 38.1 % (ref 36.0–46.0)
Hemoglobin: 12.6 g/dL (ref 12.0–15.0)
Immature Granulocytes: 0 %
Lymphocytes Relative: 25 %
Lymphs Abs: 1.6 10*3/uL (ref 0.7–4.0)
MCH: 37.3 pg — ABNORMAL HIGH (ref 26.0–34.0)
MCHC: 33.1 g/dL (ref 30.0–36.0)
MCV: 112.7 fL — ABNORMAL HIGH (ref 80.0–100.0)
Monocytes Absolute: 0.5 10*3/uL (ref 0.1–1.0)
Monocytes Relative: 8 %
Neutro Abs: 3.6 10*3/uL (ref 1.7–7.7)
Neutrophils Relative %: 54 %
Platelets: 185 10*3/uL (ref 150–400)
RBC: 3.38 MIL/uL — ABNORMAL LOW (ref 3.87–5.11)
RDW: 16.9 % — ABNORMAL HIGH (ref 11.5–15.5)
WBC: 6.6 10*3/uL (ref 4.0–10.5)
nRBC: 0 % (ref 0.0–0.2)

## 2020-01-15 LAB — RESPIRATORY PANEL BY RT PCR (FLU A&B, COVID)
Influenza A by PCR: NEGATIVE
Influenza B by PCR: NEGATIVE
SARS Coronavirus 2 by RT PCR: NEGATIVE

## 2020-01-15 LAB — PROTIME-INR
INR: 4.3 (ref 0.8–1.2)
Prothrombin Time: 39.7 seconds — ABNORMAL HIGH (ref 11.4–15.2)

## 2020-01-15 LAB — BRAIN NATRIURETIC PEPTIDE: B Natriuretic Peptide: 193 pg/mL — ABNORMAL HIGH (ref 0.0–100.0)

## 2020-01-15 MED ORDER — BUPROPION HCL ER (SR) 150 MG PO TB12
150.0000 mg | ORAL_TABLET | Freq: Every day | ORAL | Status: DC
  Administered 2020-01-15 – 2020-01-17 (×3): 150 mg via ORAL
  Filled 2020-01-15 (×3): qty 1

## 2020-01-15 MED ORDER — ACETAMINOPHEN 325 MG PO TABS
650.0000 mg | ORAL_TABLET | Freq: Four times a day (QID) | ORAL | Status: DC | PRN
  Administered 2020-01-16 – 2020-01-17 (×2): 650 mg via ORAL
  Filled 2020-01-15 (×2): qty 2

## 2020-01-15 MED ORDER — SODIUM CHLORIDE 0.9 % IV SOLN: INTRAVENOUS | Status: DC

## 2020-01-15 MED ORDER — ALPRAZOLAM 0.5 MG PO TABS: 0.5000 mg | ORAL_TABLET | Freq: Three times a day (TID) | ORAL | Status: DC | PRN

## 2020-01-15 MED ORDER — ONDANSETRON HCL 4 MG/2ML IJ SOLN: 4.0000 mg | Freq: Four times a day (QID) | INTRAMUSCULAR | Status: DC | PRN

## 2020-01-15 MED ORDER — VITAMIN B-12 1000 MCG PO TABS
500.0000 ug | ORAL_TABLET | Freq: Every day | ORAL | Status: DC
  Administered 2020-01-15 – 2020-01-17 (×3): 500 ug via ORAL
  Filled 2020-01-15 (×3): qty 1

## 2020-01-15 MED ORDER — LACTULOSE 10 GM/15ML PO SOLN
20.0000 g | Freq: Every day | ORAL | Status: DC
  Administered 2020-01-15 – 2020-01-17 (×3): 20 g via ORAL
  Filled 2020-01-15 (×3): qty 30

## 2020-01-15 MED ORDER — VITAMIN D (ERGOCALCIFEROL) 1.25 MG (50000 UNIT) PO CAPS: 50000.0000 [IU] | ORAL_CAPSULE | ORAL | Status: DC

## 2020-01-15 MED ORDER — WARFARIN - PHARMACIST DOSING INPATIENT: Freq: Every day | Status: DC

## 2020-01-15 MED ORDER — HYDROCODONE-ACETAMINOPHEN 10-325 MG PO TABS
1.0000 | ORAL_TABLET | Freq: Two times a day (BID) | ORAL | Status: DC
  Administered 2020-01-16 – 2020-01-17 (×3): 1 via ORAL
  Filled 2020-01-15 (×3): qty 1

## 2020-01-15 MED ORDER — SODIUM ZIRCONIUM CYCLOSILICATE 10 G PO PACK
10.0000 g | PACK | Freq: Once | ORAL | Status: AC
  Administered 2020-01-15: 10 g via ORAL
  Filled 2020-01-15: qty 1

## 2020-01-15 MED ORDER — TRAZODONE HCL 50 MG PO TABS: 50.0000 mg | ORAL_TABLET | Freq: Every evening | ORAL | Status: DC | PRN

## 2020-01-15 MED ORDER — LORAZEPAM 2 MG/ML IJ SOLN
1.0000 mg | Freq: Four times a day (QID) | INTRAMUSCULAR | Status: DC | PRN
  Administered 2020-01-15 – 2020-01-17 (×2): 1 mg via INTRAVENOUS
  Filled 2020-01-15 (×2): qty 1

## 2020-01-15 MED ORDER — SODIUM CHLORIDE 0.9 % IV BOLUS
500.0000 mL | Freq: Once | INTRAVENOUS | Status: AC
  Administered 2020-01-15: 500 mL via INTRAVENOUS

## 2020-01-15 MED ORDER — POLYETHYLENE GLYCOL 3350 17 G PO PACK: 17.0000 g | PACK | Freq: Every day | ORAL | Status: DC | PRN

## 2020-01-15 MED ORDER — SIMVASTATIN 20 MG PO TABS
40.0000 mg | ORAL_TABLET | Freq: Every day | ORAL | Status: DC
  Administered 2020-01-16: 40 mg via ORAL
  Filled 2020-01-15 (×2): qty 2

## 2020-01-15 MED ORDER — ONDANSETRON HCL 4 MG PO TABS: 4.0000 mg | ORAL_TABLET | Freq: Four times a day (QID) | ORAL | Status: DC | PRN

## 2020-01-15 MED ORDER — SODIUM CHLORIDE 0.9 % IV SOLN: 250.0000 mL | INTRAVENOUS | Status: DC | PRN

## 2020-01-15 MED ORDER — ACETAMINOPHEN 650 MG RE SUPP: 650.0000 mg | Freq: Four times a day (QID) | RECTAL | Status: DC | PRN

## 2020-01-15 MED ORDER — SODIUM CHLORIDE 0.9% FLUSH
3.0000 mL | Freq: Two times a day (BID) | INTRAVENOUS | Status: DC
  Administered 2020-01-16: 3 mL via INTRAVENOUS

## 2020-01-15 MED ORDER — SODIUM CHLORIDE 0.9% FLUSH
3.0000 mL | Freq: Two times a day (BID) | INTRAVENOUS | Status: DC
  Administered 2020-01-15 – 2020-01-17 (×3): 3 mL via INTRAVENOUS

## 2020-01-15 MED ORDER — BISACODYL 10 MG RE SUPP: 10.0000 mg | Freq: Every day | RECTAL | Status: DC | PRN

## 2020-01-15 MED ORDER — HALOPERIDOL LACTATE 5 MG/ML IJ SOLN
5.0000 mg | Freq: Four times a day (QID) | INTRAMUSCULAR | Status: DC | PRN
  Administered 2020-01-15: 5 mg via INTRAMUSCULAR
  Filled 2020-01-15: qty 1

## 2020-01-15 MED ORDER — SODIUM CHLORIDE 0.9% FLUSH: 3.0000 mL | INTRAVENOUS | Status: DC | PRN

## 2020-01-15 MED ORDER — DEXTROSE-NACL 5-0.45 % IV SOLN: INTRAVENOUS | Status: DC

## 2020-01-15 MED ORDER — PANTOPRAZOLE SODIUM 40 MG PO TBEC
40.0000 mg | DELAYED_RELEASE_TABLET | Freq: Every day | ORAL | Status: DC
  Administered 2020-01-17: 40 mg via ORAL
  Filled 2020-01-15 (×2): qty 1

## 2020-01-15 NOTE — TOC Initial Note (Addendum)
Transition of Care [facility]) - Initial/Assessment Note    Patient Details  Name: Mary [name] MRN: [id number] Date of Birth: [birth year]  Transition of Care [facility] CM/SW Contact:    Iona Beard, [place] Phone Number: 01/15/2020, 6:16 PM  Clinical Narrative:                 Pt admitted for AKI. TOC received consult for SNF placement. CSW spoke with pts husband Lykens,Wendell [phone] due to pt only being oriented to self and place. Per Mr. Haverstick pt can complete ADLs but has been having a hard time. Mr. Liberati states that a representative from [facility] was supposed to come to the home to assess but was unable due to pt being in hospital. Per chart review pt has previously been active with [facility] for [facility].  Mr. Sal[place] states that his wife has been having some memory/mind problems recently. Mr. Kalman states that recently he is having a hard time leaving the home to run errands because pt cannot be left alone due to falling. Mr.Burich states that he will not apply pt for Medicaid under any circumstances. Mr. Blackson states that he does not know if pt will need short or long term care. CSW spoke with Mr. Strader about PT recommending SNF for pt and if he would be agreeable to a referral being sent out to local facilities. Mr. Huesca is agreeable to a referral being made. CSW informed Mr. Vida that [facility] will follow up as bed offers are made. Fl2 completed and signed by MD. CSW sent referral out to local facilities. CSW faxed clinicals out to begin insurance auth. TOC to follow.   Expected Discharge Plan: Skilled Nursing Facility Barriers to Discharge: Continued Medical Work up   Patient Goals and CMS Choice Patient states their goals for this hospitalization and ongoing recovery are:: Go to SNF for rehab   Choice offered to / list presented to : NA  Expected Discharge Plan and Services Expected Discharge Plan: [place] In-house Referral: Clinical Social Work Discharge Planning Services:  NA Post Acute Care Choice: [place] Living arrangements for the past 2 months: Single Family Home                 DME Arranged: N/A DME Agency: NA       HH Arranged: NA [place] Agency: NA        Prior Living Arrangements/Services Living arrangements for the past 2 months: Single Family Home Lives with:: Spouse   Do you feel safe going back to the place where you live?: Yes        Care giver support system in place?: Yes (comment) (Duffee,Wendell (Spouse) [phone]) Current home services: DME Criminal Activity/Legal Involvement Pertinent to Current Situation/Hospitalization: No - Comment as needed  Activities of Daily Living Home Assistive Devices/Equipment: None ADL Screening (condition at time of admission) Patient's cognitive ability adequate to safely complete daily activities?: Yes Is the patient deaf or have difficulty hearing?: No Does the patient have difficulty seeing, even when wearing glasses/contacts?: No Does the patient have difficulty concentrating, remembering, or making decisions?: Yes Patient able to express need for assistance with ADLs?: No Does the patient have difficulty dressing or bathing?: Yes Independently performs ADLs?: Yes (appropriate for developmental age) Does the patient have difficulty walking or climbing stairs?: Yes Weakness of Legs: Both Weakness of Arms/Hands: None  Permission Sought/Granted  Emotional Assessment       Orientation: : Oriented to Self, Oriented to Place Alcohol / Substance Use: Not Applicable Psych Involvement: No (comment)  Admission diagnosis:  Hyperkalemia [E87.5] Fall [W19.XXXA] AKI (acute kidney injury) ([place]) [N17.9] Fall, initial encounter [W19.XXXA] Altered mental status, unspecified altered mental status type [R41.82] Patient Active Problem List   Diagnosis Date Noted  . Hyperkalemia 01/15/2020  . 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 Anticoagulant  long-term use 01/15/2020  . Other secondary pulmonary hypertension ([place]) 10/28/2019  . Acute on chronic combined systolic and diastolic CHF (congestive heart failure) ([place])   . Anasarca 04/25/2019  . Hypoalbuminemia 04/25/2019  . Persistent atrial fibrillation ([place]) 04/25/2019  . CKD (chronic kidney disease), stage III ([place]) 04/25/2019  . Anemia due to chronic kidney disease 04/25/2019  . Pressure injury of skin 04/24/2019  . Fall 04/23/2019  . S/P right knee arthroscopy 04/13/2015  . OSA (obstructive sleep apnea) 01/01/2013  . Diabetes ([place]) 10/23/2012  . Aortic valve disorder 06/30/2010  . CVA (cerebral vascular accident) ([place]) 06/30/2010  . S/P aortic valve replacement 06/30/2010  . Hyperlipidemia 09/29/2008  . Obesity 09/29/2008  . Essential hypertension 09/29/2008  . DEGENERATIVE JOINT DISEASE 09/29/2008   PCP:  [name], MD Pharmacy:   [facility] [address], [name] - [place] [facility] [address] [place] [address] Phone: [phone] Fax: [phone]     Social Determinants of Health (SDOH) Interventions    Readmission Risk Interventions No flowsheet data found.

## 2020-01-15 NOTE — Progress Notes (Signed)
ANTICOAGULATION CONSULT NOTE - Initial Consult  Pharmacy Consult for  Warfarin  Indication: h/o AVR, atrial fibrillation   Allergies  Allergen Reactions  . Tape Itching    Redness, Please use "paper" tape only    Patient Measurements: Height: 5\' 8"  (172.7 cm) Weight: 51.7 kg (114 lb) IBW/kg (Calculated) : 63.9 Heparin Dosing Weight: n/a  Vital Signs: Temp: 98.2 F (36.8 C) (10/28 1714) Temp Source: Axillary (10/28 1714) BP: 126/81 (10/28 1714) Pulse Rate: 85 (10/28 1714)  Labs: Recent Labs    01/15/20 1325  HGB 12.6  HCT 38.1  PLT 185  CREATININE 2.01*    Estimated Creatinine Clearance: 17.9 mL/min (A) (by C-G formula based on SCr of 2.01 mg/dL (H)).   Medical History: Past Medical History:  Diagnosis Date  . Anxiety   . Diabetes mellitus with neuropathy ([place])   . Diabetic neuropathy ([place])   . DJD (degenerative joint disease)   . H/O aortic valve replacement   . Hyperlipidemia   . Hypertension   . Neuromuscular disorder (HCC)    neuropathy in feet  . Obesity   . Sleep apnea   . Stroke [facility])    " light stroke"  . Wears glasses     Medications:  Medications Prior to Admission  Medication Sig Dispense Refill Last Dose  . acetaminophen (TYLENOL) 500 MG tablet Take 500 mg by mouth 2 (two) times daily as needed for mild pain or moderate pain.    Past Month at Unknown time  . ALPRAZolam (XANAX) 0.5 MG tablet Take 0.5 mg by mouth 3 (three) times daily as needed for anxiety.   01/15/2020 at Unknown time  . bisacodyl (DULCOLAX) 5 MG EC tablet Take 5 mg by mouth daily as needed for moderate constipation.   Past Week at Unknown time  . buPROPion (WELLBUTRIN SR) 150 MG 12 hr tablet Take 150 mg by mouth daily.   01/14/2020 at Unknown time  . cetirizine (ZYRTEC) 10 MG tablet Take 10 mg by mouth at bedtime as needed for allergies.   11 Past Month at Unknown time  . fluticasone (FLONASE) 50 MCG/ACT nasal spray Place 1 spray into both nostrils as needed.   11 Past Month  at Unknown time  . HYDROcodone-acetaminophen (NORCO) 10-325 MG tablet Take 1 tablet by mouth 2 (two) times daily.   0 01/14/2020 at Unknown time  . lactulose (CHRONULAC) 10 GM/15ML solution Take 30 mLs (20 g total) by mouth daily. 236 mL 0 01/14/2020 at Unknown time  . pantoprazole (PROTONIX) 40 MG tablet Take 1 tablet (40 mg total) by mouth daily before breakfast. 30 tablet 5 01/14/2020 at Unknown time  . simvastatin (ZOCOR) 40 MG tablet Take 40 mg by mouth daily at 8 pm.    01/14/2020 at Unknown time  . vitamin B-12 (CYANOCOBALAMIN) 500 MCG tablet Take 500 mcg by mouth daily.   01/14/2020 at Unknown time  . Vitamin D, Ergocalciferol, (DRISDOL) 1.25 MG (50000 UT) CAPS capsule Take 50,000 Units by mouth every Sunday.    01/11/2020  . warfarin (COUMADIN) 2 MG tablet Take 1 tablet daily or as directed by Coumadin clinic 100 tablet 1 01/14/2020 at 2000    Assessment: 79 y.o female, to ED 10/28 , had a fall at home for past 3 days, multiple skin tears, on Coumadin PTA,  pt reports she hit her head on 10/27.  Worsening dementia or change in mental status for past 2 weeks.     She takes warfarin for h/o AVR, mechanical  St. Jude valve 05/1993, PAF, CVA history.  Prior to admission Warfarin dose 2mg  daily and INR goal 2.5-3.5 per anticoagulation office visit 10/22 .  Warfarin reported last taken on 01/14/20 at 20:00.   H/H 12.6/38.1, pltc 185 CT head 10/28:  No evidence of hemorrhage   STAT Protime /INR ordered earlier today.  RN reports have not been able to draw labs due to patient combative,  attempting to hit  staff and cursing. Prn ativan given per order.   RN will attempt to draw labs when patient calms down.  If not able to draw labs, RN to discuss with MD.     Goal of Therapy:  INR 2.5-3.5  Monitor platelets by anticoagulation protocol: Yes   Plan:  INR is pending Will order warfarin dose once INR result known.    Nicole Cella, RPh Clinical Pharmacist 01/15/2020,7:01 PM

## 2020-01-15 NOTE — ED Notes (Signed)
Patient transported to CT 

## 2020-01-15 NOTE — Plan of Care (Signed)
  Problem: Acute Rehab PT Goals(only PT should resolve) Goal: Pt Will Go Supine/Side To Sit Outcome: Progressing Flowsheets (Taken 01/15/2020 1610) Pt will go Supine/Side to Sit: with minimal assist Goal: Patient Will Transfer Sit To/From Stand Outcome: Progressing Flowsheets (Taken 01/15/2020 1610) Patient will transfer sit to/from stand:  with minimal assist  with min guard assist Goal: Pt Will Transfer Bed To Chair/Chair To Bed Outcome: Progressing Flowsheets (Taken 01/15/2020 1610) Pt will Transfer Bed to Chair/Chair to Bed: with min assist Goal: Pt Will Ambulate Outcome: Progressing Flowsheets (Taken 01/15/2020 1610) Pt will Ambulate:  50 feet  with minimal assist  with rolling walker   4:11 PM, 01/15/20 Lonell Grandchild, MPT Physical Therapist with [facility] [phone] office [phone] mobile phone

## 2020-01-15 NOTE — Progress Notes (Signed)
Patient combative, attempting to hit  staff and cursing. Prn ativan given per order

## 2020-01-15 NOTE — Progress Notes (Signed)
Palliative Medicine Team  Due to high volume of referrals, there is a delay seeing this patient. PMT provider off service at [facility] over the weekend but will arrange goals of care with patient and family on Monday. Thank you for the opportunity to participate in the care of Mary [name].  Ihor Dow, DNP, FNP-C Palliative Medicine Team  Phone: [phone] Fax: [phone]

## 2020-01-15 NOTE — H&P (Signed)
Patient Demographics:    Mary [name], is a 79 y.o. female  MRN: [id number]   DOB - [birth year]  Admit Date - 01/15/2020  Outpatient Primary MD for the patient is Mary Evens, MD   Assessment & Plan:    Principal Problem:   AKI (acute kidney injury) on CKD 3b Active Problems:   Hyperkalemia   Essential hypertension   CVA (cerebral vascular accident) ([place])   S/P aortic valve replacement   Diabetes ([place])   OSA (obstructive sleep apnea)   Fall   Anemia due to chronic kidney disease   Anticoagulant long-term use    Brief Summary:- 79 y.o.femalewith past medical history relevant for diastolic dysfunction CHF, HTN, status post aortic valve replacement/mechanical [name] valve, 05/1993, paroxysmal atrial fibrillation, history of prior stroke, history of DM, CKD 3b, OSA admitted on 01/15/20 hyperkalemia and worsening renal function and recurrent falls and ambulatory dysfunction  A/p 1) AKI with hyperkalemia superimposed on CKD stage IIIb--- give Lokelma for hyperkalemia, gentle hydration with IV fluids for AKI -Be judicious with IV fluids due to underlying CHF -Renally adjust medications, avoid nephrotoxic agents / dehydration / hypotension  2)Recurrent Falls/ambulatory dysfunction--need to rule out syncopal episodes -Hip Xrays with previously noted fracture of the greater trochanter on the right with alignment near anatomic--- -CT C-spine and  CT head without new acute findings --Hospitalist input for admission requested by EDP due to hyperkalemia and worsening renal function and poor oral intake with concerns for dehydration and AKI resulting in recurrent falls and need to rule out possible syncope  -Check orthostatic vitals -PT eval appreciated recommends SNF rehab  3)status post aortic valve  replacement with [facility] [name] AVR in March 1995 ---continue Coumadin therapy for mechanical valve-- Recent echo from August 2021 without significant valvular problems at this time -Pharmacy to help manage Coumadin therapy   4)HFpEF/patient with history of combined systolic and diastolic Dysfunction CHF Exac--- in theSetting of mildpulmonary HTN -patient underwent RHC on 10/28/2019 with only mild pulmonary hypertension -Echo from August 2021 with EF of 55 to 60% -Chest x-ray with partial loculated right pleural effusion--may need thoracentesis  5)OSA---history of noncompliance with CPAP  6)history of paroxysmal A. fib and prior CVA--- pharmacy to help manage Coumadin therapy -Metoprolol for rate control  7)history of cirrhosis on prior imaging studies---does not appear to have had work-up for cirrhosis, abdominal ultrasound consistent with liver cirrhosis with ascites  --Viral hepatitis profile negative, TSH normal,  -Patient has had prior paracentensis  8)chronic anemia----Hgbcurrently stable >> 9 which is patient's baseline  9)DM2-A1c is 5.1 reflecting excellent diabetic control PTA, Use Novolog/Humalog Sliding scale insulin with Accu-Cheks/Fingersticks as ordered   Disposition/Need for in-Hospital Stay- patient unable to be discharged at this time due to--worsening renal function with AKI on CKD 3B with hyperkalemia requiring IV fluids and Lokelma  Remains inpatient appropriate because:worsening renal function with AKI on CKD 3B with hyperkalemia requiring IV fluids and Lokelma   Dispo:  The patient is from:Home Anticipated d/c is to:SNF Anticipated d/c date is: 2 days Patient currently is not medically stable to d/c. Barriers: Not Clinically Stable-worsening renal function with AKI on CKD 3B with hyperkalemia requiring IV fluids and Lokelma  Code Status : Full  Family Communication:   (patient is alert, awake and coherent)   =-Discussed with husband at bedside  Consults  :  na  With History of - Reviewed by me  Past Medical History:  Diagnosis Date  . Anxiety   . Diabetes mellitus with neuropathy ([place])   . Diabetic neuropathy ([place])   . DJD (degenerative joint disease)   . H/O aortic valve replacement   . Hyperlipidemia   . Hypertension   . Neuromuscular disorder (H[facility])    neuropathy in feet  . Obesity   . Sleep apnea   . Stroke [facility])    " light stroke"  . Wears glasses       Past Surgical History:  Procedure Laterality Date  . ANKLE SURGERY     Left tendon repair  . AORTIC VALVE REPLACEMENT  03/1994  . CARDIAC CATHETERIZATION     1996 [facility]  . CERVICAL SPINE SURGERY    . COLONOSCOPY    . HEMORRHOID SURGERY    . IR PARACENTESIS  10/31/2019  . KNEE ARTHROSCOPY Right 04/13/2015   Procedure: RIGHT KNEE ARTHROSCOPY WITH DEBRIDEMENT AND PARTIAL MEDIAL MENISCECTOMY;  Surgeon: Mcarthur Rossetti, MD;  Location: [facility];  Service: Orthopedics;  Laterality: Right;  . KNEE ARTHROSCOPY W/ MENISCECTOMY Right 04/13/2015  . LUMBAR FUSION    . RIGHT HEART CATH N/A 10/28/2019   Procedure: RIGHT HEART CATH;  Surgeon: Leonie Man, MD;  Location: [place] [facility];  Service: Cardiovascular;  Laterality: N/A;      Chief Complaint  Patient presents with  . Fall      HPI:    Mary [name]  is a 79 y.o. female with past medical history relevant for diastolic dysfunction CHF, HTN, status post aortic valve replacement/mechanical [name] valve, 05/1993, paroxysmal atrial fibrillation, history of prior stroke, history of DM, CKD 3b, OSA admitted on 01/15/20 hyperkalemia and worsening renal function and recurrent falls and ambulatory dysfunction - Patient is a poor historian, most of the history is obtained from her husband at bedside -Denies significant head injury -No chest pains , no palpitations, no increase shortness of breath No Nausea, Vomiting or Diarrhea, however oral intake has been  poor lately  -Hip Xrays with fracture of the greater trochanter on the right with alignment near anatomic - -Chest x-ray with partial loculated right pleural effusion--may need thoracentesis - CT head without acute findings, chronic right maxillary sinusitis noted -CT of the C-spine without acute findings  -Hospitalist input for admission requested by EDP due to hyperkalemia and worsening renal function and poor oral intake with concerns for dehydration and AKI resulting in recurrent falls and need to rule out possible syncope   Review of systems:    In addition to the HPI above,   A full Review of  Systems was done, all other systems reviewed are negative except as noted above in HPI , .    Social History:  Reviewed by me    Social History   Tobacco Use  . Smoking status: Never Smoker  . Smokeless tobacco: Never Used  Substance Use Topics  . Alcohol use: No    Alcohol/week: 0.0 standard drinks     Family History :  Reviewed by  me    Family History  Problem Relation Age of Onset  . Pneumonia Sister   . Dementia Mother   . Diabetes Sister      Home Medications:   Prior to Admission medications   Medication Sig Start Date End Date Taking? Authorizing Provider  acetaminophen (TYLENOL) 500 MG tablet Take 500 mg by mouth 2 (two) times daily as needed for mild pain or moderate pain.    Yes [provider]  ALPRAZolam Duanne Moron) 0.5 MG tablet Take 0.5 mg by mouth 3 (three) times daily as needed for anxiety. 12/31/19  Yes [provider]  bisacodyl (DULCOLAX) 5 MG EC tablet Take 5 mg by mouth daily as needed for moderate constipation.   Yes [provider]  buPROPion (WELLBUTRIN SR) 150 MG 12 hr tablet Take 150 mg by mouth daily. 01/07/20  Yes [provider]  cetirizine (ZYRTEC) 10 MG tablet Take 10 mg by mouth at bedtime as needed for allergies.  02/16/15  Yes [provider]  fluticasone (FLONASE) 50 MCG/ACT nasal spray Place 1  spray into both nostrils as needed.  01/13/15  Yes [provider]  HYDROcodone-acetaminophen (NORCO) 10-325 MG tablet Take 1 tablet by mouth 2 (two) times daily.  12/18/15  Yes [provider]  lactulose (CHRONULAC) 10 GM/15ML solution Take 30 mLs (20 g total) by mouth daily. 11/03/19  Yes Donne Hazel, MD  pantoprazole (PROTONIX) 40 MG tablet Take 1 tablet (40 mg total) by mouth daily before breakfast. 03/21/16  Yes Rehman, Mechele Dawley, MD  simvastatin (ZOCOR) 40 MG tablet Take 40 mg by mouth daily at 8 pm.  10/16/12  Yes [provider]  vitamin B-12 (CYANOCOBALAMIN) 500 MCG tablet Take 500 mcg by mouth daily.   Yes [provider]  Vitamin D, Ergocalciferol, (DRISDOL) 1.25 MG (50000 UT) CAPS capsule Take 50,000 Units by mouth every Sunday.    Yes [provider]  warfarin (COUMADIN) 2 MG tablet Take 1 tablet daily or as directed by Coumadin clinic 08/28/19  Yes Branch, Alphonse Guild, MD     Allergies:     Allergies  Allergen Reactions  . Tape Itching    Redness, Please use "paper" tape only     Physical Exam:   Vitals  Blood pressure (!) (P) 124/58, pulse (P) 84, temperature 98.2 F (36.8 C), temperature source Axillary, resp. rate 20, height 5\' 8"  (1.727 m), weight 51.7 kg, SpO2 100 %.  Physical Examination: General appearance - alert, and in no distress  Mental status -intermittent episodes of confusion and disorientation  eyes - sclera anicteric Neck - supple, no JVD elevation , Chest - clear  to auscultation bilaterally, symmetrical air movement,  Heart - S1 and S2 normal, irregular  Abdomen - soft, nontender, nondistended, no masses or organomegaly Neurological -patient is confused, disoriented, agitated from time to time -No new focal neuro deficit per se extremities - no pedal edema noted, intact peripheral pulses  Skin - warm, dry     Data Review:    CBC Recent Labs  Lab 01/15/20 1325  WBC 6.6  HGB 12.6  HCT 38.1  PLT  185  MCV 112.7*  MCH 37.3*  MCHC 33.1  RDW 16.9*  LYMPHSABS 1.6  MONOABS 0.5  EOSABS 0.8*  BASOSABS 0.1   ------------------------------------------------------------------------------------------------------------------  Chemistries  Recent Labs  Lab 01/15/20 1325  NA 134*  K 5.4*  CL 107  CO2 18*  GLUCOSE 80  BUN 45*  CREATININE 2.01*  CALCIUM 9.1  AST 29  ALT 15  ALKPHOS 90  BILITOT 1.2   ------------------------------------------------------------------------------------------------------------------ estimated creatinine clearance is 17.9 mL/min (A) (by C-G formula based on SCr of 2.01 mg/dL (H)). ------------------------------------------------------------------------------------------------------------------ No results for input(s): TSH, T4TOTAL, T3FREE, THYROIDAB in the last 72 hours.  Invalid input(s): FREET3   Coagulation profile Recent Labs  Lab 01/09/20 1214  INR 3.0   ------------------------------------------------------------------------------------------------------------------- No results for input(s): DDIMER in the last 72 hours. -------------------------------------------------------------------------------------------------------------------  Cardiac Enzymes No results for input(s): CKMB, TROPONINI, MYOGLOBIN in the last 168 hours.  Invalid input(s): CK ------------------------------------------------------------------------------------------------------------------    Component Value Date/Time   BNP 193.0 (H) 01/15/2020 1325     ---------------------------------------------------------------------------------------------------------------  Urinalysis    Component Value Date/Time   COLORURINE STRAW (A) 04/26/2019 2003   APPEARANCEUR CLEAR 04/26/2019 2003   LABSPEC 1.005 04/26/2019 2003   PHURINE 6.0 04/26/2019 2003   GLUCOSEU NEGATIVE 04/26/2019 2003   [place] NEGATIVE 04/26/2019 2003   [place] NEGATIVE 04/26/2019 2003    [place] NEGATIVE 04/26/2019 2003   PROTEINUR NEGATIVE 04/26/2019 2003   UROBILINOGEN 2.0 (H) 11/01/2007 1518   NITRITE NEGATIVE 04/26/2019 2003   LEUKOCYTESUR NEGATIVE 04/26/2019 2003    ----------------------------------------------------------------------------------------------------------------   Imaging Results:    DG Chest 1 View  Result Date: 01/15/2020 CLINICAL DATA:  Pain following recent fall EXAM: CHEST  1 VIEW COMPARISON:  January 14, 2020 FINDINGS: There is a persistent partially loculated right pleural effusion. The lungs elsewhere are clear. Heart is upper normal in size with pulmonary vascularity normal. Patient is status post median sternotomy. No adenopathy. There is aortic atherosclerosis. No appreciable bone lesions. IMPRESSION: Partially loculated right pleural effusion. Lungs elsewhere clear. Stable cardiac silhouette with postoperative changes. No pneumothorax. Aortic Atherosclerosis (ICD10-I70.0). Electronically Signed   By: Lowella Grip III M.D.   On: 01/15/2020 13:04   DG Ribs Bilateral W/Chest  Result Date: 01/14/2020 CLINICAL DATA:  Pain following recent fall EXAM: BILATERAL RIBS AND CHEST - 4+ VIEW COMPARISON:  Chest radiograph December 08, 2019 FINDINGS: Frontal chest as well as bilateral oblique and cone-down rib images obtained. There is a persistent right pleural effusion. The lungs elsewhere are clear. The heart is upper normal in size with pulmonary vascularity normal. Patient is status post median sternotomy. There is aortic atherosclerosis. There is no evident pneumothorax.  No fracture evident. IMPRESSION: Stable persistent right pleural effusion. No pneumothorax. No rib fracture appreciable. No edema or airspace opacity. Stable cardiac silhouette. Aortic Atherosclerosis (ICD10-I70.0). Electronically Signed   By: Lowella Grip III M.D.   On: 01/14/2020 14:26   CT Head Wo Contrast  Result Date: 01/15/2020 CLINICAL DATA:  Multiple falls.  Hit  head. EXAM: CT HEAD WITHOUT CONTRAST CT CERVICAL SPINE WITHOUT CONTRAST TECHNIQUE: Multidetector CT imaging of the head and cervical spine was performed following the standard protocol without intravenous contrast. Multiplanar CT image reconstructions of the cervical spine were also generated. COMPARISON:  01/04/2020 FINDINGS: CT HEAD FINDINGS Brain: No evidence of acute large vascular territory infarction, hemorrhage, hydrocephalus, extra-axial collection or mass lesion/mass effect. Similar patchy white matter hypoattenuation, compatible with chronic microvascular ischemic disease. Tiny remote right cerebellar infarct. Generalized cerebral atrophy with ex vacuo ventricular dilation. Vascular: Calcific atherosclerosis. Skull: Normal. Negative for fracture or focal lesion. Sinuses/Orbits: Mucosal thickening of the right maxillary sinus with calcified secretions. Evidence of prior right maxillary antrostomy. Other: No mastoid effusions. CT CERVICAL SPINE FINDINGS Alignment: No acute subluxation. Skull base and vertebrae: No acute fracture. Vertebral body heights are maintained. Osteopenia. Soft tissues and spinal canal: No  prevertebral fluid or swelling. No visible canal hematoma. Disc levels: Similar multilevel degenerative change. Similar C5-C7 osseous fusion. Disc bulges at C2-C3, C3-C4, C4-C5 efface ventral CSF. Upper chest: Negative. Other: Calcific atherosclerosis. IMPRESSION: CT head: 1. No evidence of acute intracranial abnormality 2. Chronic microvascular disease. 3. Findings suggestive of chronic right maxillary sinusitis. CT cervical spine: No evidence of acute fracture or traumatic malalignment. Electronically Signed   By: Margaretha Sheffield MD   On: 01/15/2020 12:55   CT Cervical Spine Wo Contrast  Result Date: 01/15/2020 CLINICAL DATA:  Multiple falls.  Hit head. EXAM: CT HEAD WITHOUT CONTRAST CT CERVICAL SPINE WITHOUT CONTRAST TECHNIQUE: Multidetector CT imaging of the head and cervical spine was  performed following the standard protocol without intravenous contrast. Multiplanar CT image reconstructions of the cervical spine were also generated. COMPARISON:  01/04/2020 FINDINGS: CT HEAD FINDINGS Brain: No evidence of acute large vascular territory infarction, hemorrhage, hydrocephalus, extra-axial collection or mass lesion/mass effect. Similar patchy white matter hypoattenuation, compatible with chronic microvascular ischemic disease. Tiny remote right cerebellar infarct. Generalized cerebral atrophy with ex vacuo ventricular dilation. Vascular: Calcific atherosclerosis. Skull: Normal. Negative for fracture or focal lesion. Sinuses/Orbits: Mucosal thickening of the right maxillary sinus with calcified secretions. Evidence of prior right maxillary antrostomy. Other: No mastoid effusions. CT CERVICAL SPINE FINDINGS Alignment: No acute subluxation. Skull base and vertebrae: No acute fracture. Vertebral body heights are maintained. Osteopenia. Soft tissues and spinal canal: No prevertebral fluid or swelling. No visible canal hematoma. Disc levels: Similar multilevel degenerative change. Similar C5-C7 osseous fusion. Disc bulges at C2-C3, C3-C4, C4-C5 efface ventral CSF. Upper chest: Negative. Other: Calcific atherosclerosis. IMPRESSION: CT head: 1. No evidence of acute intracranial abnormality 2. Chronic microvascular disease. 3. Findings suggestive of chronic right maxillary sinusitis. CT cervical spine: No evidence of acute fracture or traumatic malalignment. Electronically Signed   By: Margaretha Sheffield MD   On: 01/15/2020 12:55   DG HIPS BILAT WITH PELVIS MIN 5 VIEWS  Result Date: 01/15/2020 CLINICAL DATA:  Pain with recent falls EXAM: DG HIP (WITH OR WITHOUT PELVIS) 5+V BILAT COMPARISON:  December 07, 2019 pelvis and right hip radiograph; CT right hip joint December 07, 2019 FINDINGS: Frontal pelvis as well as frontal and lateral views of each hip joint-total 5-views obtained. Previously noted  fracture of the greater trochanter on the right is currently in near anatomic alignment. No new fracture evident. There is underlying osteoporosis. No dislocation. There is slight symmetric narrowing of each hip joint. There is a stable benign exostosis arising from the left iliac crest laterally measuring 2.3 x 1.6 cm. IMPRESSION: No appreciable change in the previously noted fracture of the greater trochanter on the right with alignment near anatomic. No new fracture. No dislocation. Bones osteoporotic. Mild symmetric narrowing each hip joint. Electronically Signed   By: Lowella Grip III M.D.   On: 01/15/2020 13:07    Radiological Exams on Admission: DG Chest 1 View  Result Date: 01/15/2020 CLINICAL DATA:  Pain following recent fall EXAM: CHEST  1 VIEW COMPARISON:  January 14, 2020 FINDINGS: There is a persistent partially loculated right pleural effusion. The lungs elsewhere are clear. Heart is upper normal in size with pulmonary vascularity normal. Patient is status post median sternotomy. No adenopathy. There is aortic atherosclerosis. No appreciable bone lesions. IMPRESSION: Partially loculated right pleural effusion. Lungs elsewhere clear. Stable cardiac silhouette with postoperative changes. No pneumothorax. Aortic Atherosclerosis (ICD10-I70.0). Electronically Signed   By: Lowella Grip III M.D.   On: 01/15/2020 13:04  DG Ribs Bilateral W/Chest  Result Date: 01/14/2020 CLINICAL DATA:  Pain following recent fall EXAM: BILATERAL RIBS AND CHEST - 4+ VIEW COMPARISON:  Chest radiograph December 08, 2019 FINDINGS: Frontal chest as well as bilateral oblique and cone-down rib images obtained. There is a persistent right pleural effusion. The lungs elsewhere are clear. The heart is upper normal in size with pulmonary vascularity normal. Patient is status post median sternotomy. There is aortic atherosclerosis. There is no evident pneumothorax.  No fracture evident. IMPRESSION: Stable persistent  right pleural effusion. No pneumothorax. No rib fracture appreciable. No edema or airspace opacity. Stable cardiac silhouette. Aortic Atherosclerosis (ICD10-I70.0). Electronically Signed   By: Lowella Grip III M.D.   On: 01/14/2020 14:26   CT Head Wo Contrast  Result Date: 01/15/2020 CLINICAL DATA:  Multiple falls.  Hit head. EXAM: CT HEAD WITHOUT CONTRAST CT CERVICAL SPINE WITHOUT CONTRAST TECHNIQUE: Multidetector CT imaging of the head and cervical spine was performed following the standard protocol without intravenous contrast. Multiplanar CT image reconstructions of the cervical spine were also generated. COMPARISON:  01/04/2020 FINDINGS: CT HEAD FINDINGS Brain: No evidence of acute large vascular territory infarction, hemorrhage, hydrocephalus, extra-axial collection or mass lesion/mass effect. Similar patchy white matter hypoattenuation, compatible with chronic microvascular ischemic disease. Tiny remote right cerebellar infarct. Generalized cerebral atrophy with ex vacuo ventricular dilation. Vascular: Calcific atherosclerosis. Skull: Normal. Negative for fracture or focal lesion. Sinuses/Orbits: Mucosal thickening of the right maxillary sinus with calcified secretions. Evidence of prior right maxillary antrostomy. Other: No mastoid effusions. CT CERVICAL SPINE FINDINGS Alignment: No acute subluxation. Skull base and vertebrae: No acute fracture. Vertebral body heights are maintained. Osteopenia. Soft tissues and spinal canal: No prevertebral fluid or swelling. No visible canal hematoma. Disc levels: Similar multilevel degenerative change. Similar C5-C7 osseous fusion. Disc bulges at C2-C3, C3-C4, C4-C5 efface ventral CSF. Upper chest: Negative. Other: Calcific atherosclerosis. IMPRESSION: CT head: 1. No evidence of acute intracranial abnormality 2. Chronic microvascular disease. 3. Findings suggestive of chronic right maxillary sinusitis. CT cervical spine: No evidence of acute fracture or  traumatic malalignment. Electronically Signed   By: Margaretha Sheffield MD   On: 01/15/2020 12:55   CT Cervical Spine Wo Contrast  Result Date: 01/15/2020 CLINICAL DATA:  Multiple falls.  Hit head. EXAM: CT HEAD WITHOUT CONTRAST CT CERVICAL SPINE WITHOUT CONTRAST TECHNIQUE: Multidetector CT imaging of the head and cervical spine was performed following the standard protocol without intravenous contrast. Multiplanar CT image reconstructions of the cervical spine were also generated. COMPARISON:  01/04/2020 FINDINGS: CT HEAD FINDINGS Brain: No evidence of acute large vascular territory infarction, hemorrhage, hydrocephalus, extra-axial collection or mass lesion/mass effect. Similar patchy white matter hypoattenuation, compatible with chronic microvascular ischemic disease. Tiny remote right cerebellar infarct. Generalized cerebral atrophy with ex vacuo ventricular dilation. Vascular: Calcific atherosclerosis. Skull: Normal. Negative for fracture or focal lesion. Sinuses/Orbits: Mucosal thickening of the right maxillary sinus with calcified secretions. Evidence of prior right maxillary antrostomy. Other: No mastoid effusions. CT CERVICAL SPINE FINDINGS Alignment: No acute subluxation. Skull base and vertebrae: No acute fracture. Vertebral body heights are maintained. Osteopenia. Soft tissues and spinal canal: No prevertebral fluid or swelling. No visible canal hematoma. Disc levels: Similar multilevel degenerative change. Similar C5-C7 osseous fusion. Disc bulges at C2-C3, C3-C4, C4-C5 efface ventral CSF. Upper chest: Negative. Other: Calcific atherosclerosis. IMPRESSION: CT head: 1. No evidence of acute intracranial abnormality 2. Chronic microvascular disease. 3. Findings suggestive of chronic right maxillary sinusitis. CT cervical spine: No evidence of acute fracture or  traumatic malalignment. Electronically Signed   By: Margaretha Sheffield MD   On: 01/15/2020 12:55   DG HIPS BILAT WITH PELVIS MIN 5  VIEWS  Result Date: 01/15/2020 CLINICAL DATA:  Pain with recent falls EXAM: DG HIP (WITH OR WITHOUT PELVIS) 5+V BILAT COMPARISON:  December 07, 2019 pelvis and right hip radiograph; CT right hip joint December 07, 2019 FINDINGS: Frontal pelvis as well as frontal and lateral views of each hip joint-total 5-views obtained. Previously noted fracture of the greater trochanter on the right is currently in near anatomic alignment. No new fracture evident. There is underlying osteoporosis. No dislocation. There is slight symmetric narrowing of each hip joint. There is a stable benign exostosis arising from the left iliac crest laterally measuring 2.3 x 1.6 cm. IMPRESSION: No appreciable change in the previously noted fracture of the greater trochanter on the right with alignment near anatomic. No new fracture. No dislocation. Bones osteoporotic. Mild symmetric narrowing each hip joint. Electronically Signed   By: Lowella Grip III M.D.   On: 01/15/2020 13:07    DVT Prophylaxis -SCD  /coumadin AM Labs Ordered, also please review Full Orders  Family Communication: Admission, patients condition and plan of care including tests being ordered have been discussed with the patient and husband at bedside who indicate understanding and agree with the plan   Code Status - Full Code  Likely DC to home with home health versus SNF rehab  Condition   stable  Roxan Hockey M.D on 01/15/2020 at 6:24 PM Go to [email or web address] -  for contact info  [facility] - Office  [phone]

## 2020-01-15 NOTE — ED Notes (Signed)
ED TO INPATIENT HANDOFF REPORT  ED Nurse Name and Phone #:   S Name/Age/Gender Mary [name] 79 y.o. female Room/Bed: APA14/APA14  Code Status   Code Status: Prior  Home/SNF/Other Home Patient oriented to: self Is this baseline? Yes   Triage Complete: Triage complete  Chief Complaint Fall [W19.XXXA] Hyperkalemia [E87.5]  Triage Note Patient c/o pain all over with more pain in left rib area. Patient has had multiple falls recently with x2 yesterday. Patient had x-ray of ribs yesterday with no results back yet. Husband does report that she hit her head yesterday. Patient takes warfarin. Patient has multiple bandages on arms and legs due to falls.     Allergies Allergies  Allergen Reactions  . Tape Itching    Redness, Please use "paper" tape only    Level of Care/Admitting Diagnosis ED Disposition    ED Disposition Condition [place]: [facility] [[id number]]  Level of Care: Telemetry [5]  Covid Evaluation: Asymptomatic Screening Protocol (No Symptoms)  Diagnosis: Hyperkalemia [644034]  Admitting Physician: Morrison Old  Attending Physician: Morrison Old  Estimated length of stay: 3 - 4 days  Certification:: I certify this patient will need inpatient services for at least 2 midnights       B Medical/Surgery History Past Medical History:  Diagnosis Date  . Anxiety   . Diabetes mellitus with neuropathy ([place])   . Diabetic neuropathy ([place])   . DJD (degenerative joint disease)   . H/O aortic valve replacement   . Hyperlipidemia   . Hypertension   . Neuromuscular disorder (HCC)    neuropathy in feet  . Obesity   . Sleep apnea   . 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 Wears glasses    Past Surgical History:  Procedure Laterality Date  . ANKLE SURGERY     Left tendon repair  . AORTIC VALVE REPLACEMENT  03/1994  . CARDIAC CATHETERIZATION     1996 [facility]  . CERVICAL SPINE SURGERY    . COLONOSCOPY    .  HEMORRHOID SURGERY    . IR PARACENTESIS  10/31/2019  . KNEE ARTHROSCOPY Right 04/13/2015   Procedure: RIGHT KNEE ARTHROSCOPY WITH DEBRIDEMENT AND PARTIAL MEDIAL MENISCECTOMY;  Surgeon: Mcarthur Rossetti, MD;  Location: [facility];  Service: Orthopedics;  Laterality: Right;  . KNEE ARTHROSCOPY W/ MENISCECTOMY Right 04/13/2015  . LUMBAR FUSION    . RIGHT HEART CATH N/A 10/28/2019   Procedure: RIGHT HEART CATH;  Surgeon: Leonie Man, MD;  Location: [facility];  Service: Cardiovascular;  Laterality: N/A;     A IV Location/Drains/Wounds Patient Lines/Drains/Airways Status    Active Line/Drains/Airways    Name Placement date Placement time Site Days   Peripheral IV 01/15/20 Right Antecubital 01/15/20  1329  Antecubital  less than 1   Pressure Injury 04/23/19 Buttocks Right Stage 2 -  Partial thickness loss of dermis presenting as a shallow open injury with a red, pink wound bed without slough. Red, scant bloody drainage 04/23/19  2230   267   Pressure Injury 04/23/19 Buttocks Right Stage 2 -  Partial thickness loss of dermis presenting as a shallow open injury with a red, pink wound bed without slough. red, no drainage 04/23/19  2230   267   Pressure Injury 04/23/19 Sacrum Stage 2 -  Partial thickness loss of dermis presenting as a shallow open injury with a red, pink wound bed without slough. red with scant  bloody drainage 04/23/19  2230   267   Pressure Injury 10/27/19 Sacrum Medial Stage 1 -  Intact skin with non-blanchable redness of a localized area usually over a bony prominence. 10/27/19  2010   80          Intake/Output Last 24 hours  Intake/Output Summary (Last 24 hours) at 01/15/2020 1611 Last data filed at 01/15/2020 1555 Gross per 24 hour  Intake 1000 ml  Output --  Net 1000 ml    Labs/Imaging Results for orders placed or performed during the hospital encounter of 01/15/20 (from the past 48 hour(s))  Comprehensive metabolic panel     Status: Abnormal   Collection  Time: 01/15/20  1:25 PM  Result Value Ref Range   Sodium 134 (L) 135 - 145 mmol/L   Potassium 5.4 (H) 3.5 - 5.1 mmol/L   Chloride 107 98 - 111 mmol/L   CO2 18 (L) 22 - 32 mmol/L   Glucose, Bld 80 70 - 99 mg/dL    Comment: Glucose reference range applies only to samples taken after fasting for at least 8 hours.   BUN 45 (H) 8 - 23 mg/dL   Creatinine, Ser 2.01 (H) 0.44 - 1.00 mg/dL   Calcium 9.1 8.9 - 10.3 mg/dL   Total Protein 8.1 6.5 - 8.1 g/dL   Albumin 3.4 (L) 3.5 - 5.0 g/dL   AST 29 15 - 41 U/L   ALT 15 0 - 44 U/L   Alkaline Phosphatase 90 38 - 126 U/L   Total Bilirubin 1.2 0.3 - 1.2 mg/dL   GFR, Estimated 24 (L) >60 mL/min    Comment: (NOTE) Calculated using the CKD-EPI Creatinine Equation (2021)    Anion gap 9 5 - 15    Comment: Performed at [facility], [address], [place], [place] [address]  CBC with Differential/Platelet     Status: Abnormal   Collection Time: 01/15/20  1:25 PM  Result Value Ref Range   WBC 6.6 4.0 - 10.5 K/uL   RBC 3.38 (L) 3.87 - 5.11 MIL/uL   Hemoglobin 12.6 12.0 - 15.0 g/dL   HCT 38.1 36 - 46 %   MCV 112.7 (H) 80.0 - 100.0 fL   MCH 37.3 (H) 26.0 - 34.0 pg   MCHC 33.1 30.0 - 36.0 g/dL   RDW 16.9 (H) 11.5 - 15.5 %   Platelets 185 150 - 400 K/uL   nRBC 0.0 0.0 - 0.2 %   Neutrophils Relative % 54 %   Neutro Abs 3.6 1.7 - 7.7 K/uL   Lymphocytes Relative 25 %   Lymphs Abs 1.6 0.7 - 4.0 K/uL   Monocytes Relative 8 %   Monocytes Absolute 0.5 0.1 - 1.0 K/uL   Eosinophils Relative 12 %   Eosinophils Absolute 0.8 (H) 0.0 - 0.5 K/uL   Basophils Relative 1 %   Basophils Absolute 0.1 0.0 - 0.1 K/uL   Immature Granulocytes 0 %   Abs Immature Granulocytes 0.02 0.00 - 0.07 K/uL    Comment: Performed at [facility], [address] [place], [place] [address]  Brain natriuretic peptide     Status: Abnormal   Collection Time: 01/15/20  1:25 PM  Result Value Ref Range   B Natriuretic Peptide 193.0 (H) 0.0 - 100.0 pg/mL    Comment: Performed at [facility], [address], [place], [place] [address]  Respiratory Panel by RT PCR (Flu A&B, Covid) - Nasopharyngeal Swab     Status: None   Collection Time:  01/15/20  1:25 PM   Specimen: Nasopharyngeal Swab  Result Value Ref Range   SARS Coronavirus 2 by RT PCR NEGATIVE NEGATIVE    Comment: (NOTE) SARS-CoV-2 target nucleic acids are NOT DETECTED.  The SARS-CoV-2 RNA is generally detectable in upper respiratoy specimens during the acute phase of infection. The lowest concentration of SARS-CoV-2 viral copies this assay can detect is 131 copies/mL. A negative result does not preclude SARS-Cov-2 infection and should not be used as the sole basis for treatment or other patient management decisions. A negative result may occur with  improper specimen collection/handling, submission of specimen other than nasopharyngeal swab, presence of viral mutation(s) within the areas targeted by this assay, and inadequate number of viral copies (<131 copies/mL). A negative result must be combined with clinical observations, patient history, and epidemiological information. The expected result is Negative.  Fact Sheet for Patients:  PinkCheek.be  Fact Sheet for Healthcare Providers:  GravelBags.it  This test is no t yet approved or cleared by the [place] FDA and  has been authorized for detection and/or diagnosis of SARS-CoV-2 by FDA under an Emergency Use Authorization (EUA). This EUA will remain  in effect (meaning this test can be used) for the duration of the COVID-19 declaration under Section 564(b)(1) of the Act, 21 U.S.C. section 360bbb-3(b)(1), unless the authorization is terminated or revoked sooner.     Influenza A by PCR NEGATIVE NEGATIVE   Influenza B by PCR NEGATIVE NEGATIVE    Comment: (NOTE) The Xpert Xpress SARS-CoV-2/FLU/RSV assay is intended as an aid in  the diagnosis of influenza from Nasopharyngeal swab specimens  and  should not be used as a sole basis for treatment. Nasal washings and  aspirates are unacceptable for Xpert Xpress SARS-CoV-2/FLU/RSV  testing.  Fact Sheet for Patients: PinkCheek.be  Fact Sheet for Healthcare Providers: GravelBags.it  This test is not yet approved or cleared by the [place] FDA and  has been authorized for detection and/or diagnosis of SARS-CoV-2 by  FDA under an Emergency Use Authorization (EUA). This EUA will remain  in effect (meaning this test can be used) for the duration of the  Covid-19 declaration under Section 564(b)(1) of the Act, 21  U.S.C. section 360bbb-3(b)(1), unless the authorization is  terminated or revoked. Performed at [facility], [address] [place], [place] [address]    DG Chest 1 View  Result Date: 01/15/2020 CLINICAL DATA:  Pain following recent fall EXAM: CHEST  1 VIEW COMPARISON:  January 14, 2020 FINDINGS: There is a persistent partially loculated right pleural effusion. The lungs elsewhere are clear. Heart is upper normal in size with pulmonary vascularity normal. Patient is status post median sternotomy. No adenopathy. There is aortic atherosclerosis. No appreciable bone lesions. IMPRESSION: Partially loculated right pleural effusion. Lungs elsewhere clear. Stable cardiac silhouette with postoperative changes. No pneumothorax. Aortic Atherosclerosis (ICD10-I70.0). Electronically Signed   By: Lowella Grip III M.D.   On: 01/15/2020 13:04   DG Ribs Bilateral W/Chest  Result Date: 01/14/2020 CLINICAL DATA:  Pain following recent fall EXAM: BILATERAL RIBS AND CHEST - 4+ VIEW COMPARISON:  Chest radiograph December 08, 2019 FINDINGS: Frontal chest as well as bilateral oblique and cone-down rib images obtained. There is a persistent right pleural effusion. The lungs elsewhere are clear. The heart is upper normal in size with pulmonary vascularity normal. Patient is status  post median sternotomy. There is aortic atherosclerosis. There is no evident pneumothorax.  No fracture evident. IMPRESSION: Stable persistent right pleural effusion. No pneumothorax.  No rib fracture appreciable. No edema or airspace opacity. Stable cardiac silhouette. Aortic Atherosclerosis (ICD10-I70.0). Electronically Signed   By: Lowella Grip III M.D.   On: 01/14/2020 14:26   CT Head Wo Contrast  Result Date: 01/15/2020 CLINICAL DATA:  Multiple falls.  Hit head. EXAM: CT HEAD WITHOUT CONTRAST CT CERVICAL SPINE WITHOUT CONTRAST TECHNIQUE: Multidetector CT imaging of the head and cervical spine was performed following the standard protocol without intravenous contrast. Multiplanar CT image reconstructions of the cervical spine were also generated. COMPARISON:  01/04/2020 FINDINGS: CT HEAD FINDINGS Brain: No evidence of acute large vascular territory infarction, hemorrhage, hydrocephalus, extra-axial collection or mass lesion/mass effect. Similar patchy white matter hypoattenuation, compatible with chronic microvascular ischemic disease. Tiny remote right cerebellar infarct. Generalized cerebral atrophy with ex vacuo ventricular dilation. Vascular: Calcific atherosclerosis. Skull: Normal. Negative for fracture or focal lesion. Sinuses/Orbits: Mucosal thickening of the right maxillary sinus with calcified secretions. Evidence of prior right maxillary antrostomy. Other: No mastoid effusions. CT CERVICAL SPINE FINDINGS Alignment: No acute subluxation. Skull base and vertebrae: No acute fracture. Vertebral body heights are maintained. Osteopenia. Soft tissues and spinal canal: No prevertebral fluid or swelling. No visible canal hematoma. Disc levels: Similar multilevel degenerative change. Similar C5-C7 osseous fusion. Disc bulges at C2-C3, C3-C4, C4-C5 efface ventral CSF. Upper chest: Negative. Other: Calcific atherosclerosis. IMPRESSION: CT head: 1. No evidence of acute intracranial abnormality 2. Chronic  microvascular disease. 3. Findings suggestive of chronic right maxillary sinusitis. CT cervical spine: No evidence of acute fracture or traumatic malalignment. Electronically Signed   By: Margaretha Sheffield MD   On: 01/15/2020 12:55   CT Cervical Spine Wo Contrast  Result Date: 01/15/2020 CLINICAL DATA:  Multiple falls.  Hit head. EXAM: CT HEAD WITHOUT CONTRAST CT CERVICAL SPINE WITHOUT CONTRAST TECHNIQUE: Multidetector CT imaging of the head and cervical spine was performed following the standard protocol without intravenous contrast. Multiplanar CT image reconstructions of the cervical spine were also generated. COMPARISON:  01/04/2020 FINDINGS: CT HEAD FINDINGS Brain: No evidence of acute large vascular territory infarction, hemorrhage, hydrocephalus, extra-axial collection or mass lesion/mass effect. Similar patchy white matter hypoattenuation, compatible with chronic microvascular ischemic disease. Tiny remote right cerebellar infarct. Generalized cerebral atrophy with ex vacuo ventricular dilation. Vascular: Calcific atherosclerosis. Skull: Normal. Negative for fracture or focal lesion. Sinuses/Orbits: Mucosal thickening of the right maxillary sinus with calcified secretions. Evidence of prior right maxillary antrostomy. Other: No mastoid effusions. CT CERVICAL SPINE FINDINGS Alignment: No acute subluxation. Skull base and vertebrae: No acute fracture. Vertebral body heights are maintained. Osteopenia. Soft tissues and spinal canal: No prevertebral fluid or swelling. No visible canal hematoma. Disc levels: Similar multilevel degenerative change. Similar C5-C7 osseous fusion. Disc bulges at C2-C3, C3-C4, C4-C5 efface ventral CSF. Upper chest: Negative. Other: Calcific atherosclerosis. IMPRESSION: CT head: 1. No evidence of acute intracranial abnormality 2. Chronic microvascular disease. 3. Findings suggestive of chronic right maxillary sinusitis. CT cervical spine: No evidence of acute fracture or traumatic  malalignment. Electronically Signed   By: Margaretha Sheffield MD   On: 01/15/2020 12:55   DG HIPS BILAT WITH PELVIS MIN 5 VIEWS  Result Date: 01/15/2020 CLINICAL DATA:  Pain with recent falls EXAM: DG HIP (WITH OR WITHOUT PELVIS) 5+V BILAT COMPARISON:  December 07, 2019 pelvis and right hip radiograph; CT right hip joint December 07, 2019 FINDINGS: Frontal pelvis as well as frontal and lateral views of each hip joint-total 5-views obtained. Previously noted fracture of the greater trochanter on the right is currently in near  anatomic alignment. No new fracture evident. There is underlying osteoporosis. No dislocation. There is slight symmetric narrowing of each hip joint. There is a stable benign exostosis arising from the left iliac crest laterally measuring 2.3 x 1.6 cm. IMPRESSION: No appreciable change in the previously noted fracture of the greater trochanter on the right with alignment near anatomic. No new fracture. No dislocation. Bones osteoporotic. Mild symmetric narrowing each hip joint. Electronically Signed   By: Lowella Grip III M.D.   On: 01/15/2020 13:07    Pending Labs Unresulted Labs (From admission, onward)          Start     Ordered   01/15/20 1507  Protime-INR  ONCE - STAT,   STAT        01/15/20 1506   01/15/20 1151  Urinalysis, Routine w reflex microscopic  ONCE - STAT,   STAT        01/15/20 1153          Vitals/Pain Today's Vitals   01/15/20 1053 01/15/20 1055 01/15/20 1430 01/15/20 1500  BP:   109/61 111/73  Pulse:  95    Resp:  (!) 22 20 (!) 23  Temp:  98.9 F (37.2 C)    TempSrc:  Rectal    SpO2:  100%    Weight: 51.7 kg     Height: 5\' 8"  (1.727 m)       Isolation Precautions No active isolations  Medications Medications  0.9 %  sodium chloride infusion (has no administration in time range)  sodium zirconium cyclosilicate (LOKELMA) packet 10 g (has no administration in time range)  haloperidol lactate (HALDOL) injection 5 mg (has no  administration in time range)  LORazepam (ATIVAN) injection 1 mg (has no administration in time range)  sodium chloride 0.9 % bolus 500 mL (0 mLs Intravenous Stopped 01/15/20 1434)  sodium chloride 0.9 % bolus 500 mL (0 mLs Intravenous Stopped 01/15/20 1555)    Mobility non-ambulatory High fall risk   Focused Assessments    R Recommendations: See Admitting Provider Note  Report given to:   Additional Notes:

## 2020-01-15 NOTE — ED Triage Notes (Signed)
Patient c/o pain all over with more pain in left rib area. Patient has had multiple falls recently with x2 yesterday. Patient had x-ray of ribs yesterday with no results back yet. Husband does report that she hit her head yesterday. Patient takes warfarin. Patient has multiple bandages on arms and legs due to falls.

## 2020-01-15 NOTE — Progress Notes (Signed)
Patient continue to be combative, attempting to climb out of bed. Safety mitts currently on patient safety sitter at bedside currently. Patient continues to curse staff. PRN haldol given per orders.
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
## 2020-01-15 NOTE — Evaluation (Signed)
Physical Therapy Evaluation Patient Details Name: Mary [name] MRN: [id number] DOB: [birth year] Today's Date: 01/15/2020   History of Present Illness  Mary [name] is a 79 y/o female brought in by her husband.  Patient's had a fall at home for the past 3 days.  Patient has multiple skin tears.  That have been happening over period of time patient is on Coumadin.  Patient does report she hit her head yesterday.  But more so patient been having worsening dementia or change in mental status for the past 2 weeks.  She is required lots of home attention.  Does have some home health nurse help.  But it is not 24/7.  Husband is having difficulty with taking care of her.  Is not thinking in terms that she needs nursing home placement.  He has been in contact with facilities.  But has not gotten the match yet.  Patient was somnolent when she arrived.  He said that he gave her benzo diazepam at home to calm her down.  And that she normally sleeps for period of time after that.    Clinical Impression  Patient demonstrates slow labored movement for sitting up at bedside requiring assistance to move legs off/onto bed, very unsteady on feet, at high risk for falls and limited to a few slow labored steps at bedside before having to sit due to c/o fatigue.  Patient put back to bed after therapy.  Patient will benefit from continued physical therapy in hospital and recommended venue below to increase strength, balance, endurance for safe ADLs and gait.     Follow Up Recommendations SNF    Equipment Recommendations  None recommended by PT    Recommendations for Other Services       Precautions / Restrictions Precautions Precautions: Fall Restrictions Weight Bearing Restrictions: No      Mobility  Bed Mobility Overal bed mobility: Needs Assistance Bed Mobility: Supine to Sit;Sit to Supine     Supine to sit: Mod assist Sit to supine: Mod assist   General bed mobility comments: slow labored movement     Transfers Overall transfer level: Needs assistance Equipment used: Rolling walker (2 wheeled) Transfers: Sit to/from [facility] Sit to Stand: Mod assist Stand pivot transfers: Mod assist       General transfer comment: unable to maintain standing balance without AD, very unsteady labored movement  Ambulation/Gait Ambulation/Gait assistance: Mod assist Gait Distance (Feet): 3 Feet Assistive device: Rolling walker (2 wheeled) Gait Pattern/deviations: Decreased step length - right;Decreased step length - left;Decreased stride length;Trunk flexed Gait velocity: slow   General Gait Details: limited to 3-4 slow labored steps at bedside, limited secondary to c/o fatigue  Stairs            Wheelchair Mobility    Modified Rankin (Stroke Patients Only)       Balance Overall balance assessment: Needs assistance Sitting-balance support: No upper extremity supported;Feet supported Sitting balance-Leahy Scale: Fair Sitting balance - Comments: seated at EOB   Standing balance support: No upper extremity supported;During functional activity Standing balance-Leahy Scale: Poor Standing balance comment: fair/poor using RW                             Pertinent Vitals/Pain Pain Assessment: No/denies pain    Home Living Family/patient expects to be discharged to:: Private residence Living Arrangements: Spouse/significant other Available Help at Discharge: Family;Available 24 hours/day Type of Home: House Home Access: Ramped  entrance     Home Layout: One level Home Equipment: Rea - 2 wheels;Bedside commode;Shower seat - built in;Grab bars - tub/shower;Cane - single point;Hospital bed      Prior Function Level of Independence: Needs assistance   Gait / Transfers 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  ADL's / Homemaking Assistance Needed: assisted by family        Hand Dominance   Dominant Hand: Right     Extremity/Trunk Assessment   Upper Extremity Assessment Upper Extremity Assessment: Generalized weakness    Lower Extremity Assessment Lower Extremity Assessment: Generalized weakness    Cervical / Trunk Assessment Cervical / Trunk Assessment: Normal  Communication   Communication: No difficulties  Cognition Arousal/Alertness: Awake/alert Behavior During Therapy: Restless;Agitated Overall Cognitive Status: No family/caregiver present to determine baseline cognitive functioning                                        General Comments      Exercises     Assessment/Plan    PT Assessment Patient needs continued PT services  PT Problem List Decreased strength;Decreased activity tolerance;Decreased balance;Decreased mobility       PT Treatment Interventions DME instruction;Gait training;Stair training;Functional mobility training;Therapeutic activities;Therapeutic exercise;Balance training;Patient/family education    PT Goals (Current goals can be found in the Care Plan section)  Acute Rehab PT Goals Patient Stated Goal: return home with family to assist PT Goal Formulation: With patient Time For Goal Achievement: 01/29/20 Potential to Achieve Goals: Good    Frequency Min 3X/week   Barriers to discharge        Co-evaluation               AM-PAC PT "6 Clicks" Mobility  Outcome Measure Help needed turning from your back to your side while in a flat bed without using bedrails?: A Little Help needed moving from lying on your back to sitting on the side of a flat bed without using bedrails?: A Lot Help needed moving to and from a bed to a chair (including a wheelchair)?: A Lot Help needed standing up from a chair using your arms (e.g., wheelchair or bedside chair)?: A Lot Help needed to walk in hospital room?: A Lot Help needed climbing 3-5 steps with a railing? : A Lot 6 Click Score: 13    End of Session   Activity Tolerance: Patient limited  by fatigue Patient left: in bed;with call bell/phone within reach Nurse Communication: Mobility status PT Visit Diagnosis: Unsteadiness on feet (R26.81);Other abnormalities of gait and mobility (R26.89);Muscle weakness (generalized) (M62.81)    Time: 2585-2778 PT Time Calculation (min) (ACUTE ONLY): 31 min   Charges:   PT Evaluation $PT Eval Moderate Complexity: 1 Mod PT Treatments $Therapeutic Activity: 8-22 mins        4:09 PM, 01/15/20 Lonell Grandchild, MPT Physical Therapist with [facility] 336 [phone] office [phone] mobile phone

## 2020-01-15 NOTE — ED Provider Notes (Addendum)
[name] EMERGENCY DEPARTMENT Provider Note   CSN: [id number] Arrival date & time: 01/15/20  1034     History Chief Complaint  Patient presents with  . Fall    Mary [name] is a 79 y.o. female.  Patient brought in by her husband.  Patient's had a fall at home for the past 3 days.  Patient has multiple skin tears.  That have been happening over period of time patient is on Coumadin.  Patient does report she hit her head yesterday.  But more so patient been having worsening dementia or change in mental status for the past 2 weeks.  She is required lots of home attention.  Does have some home health nurse help.  But it is not 24/7.  Husband is having difficulty with taking care of her.  Is not thinking in terms that she needs nursing home placement.  He has been in contact with facilities.  But has not gotten the match yet.  Patient was somnolent when she arrived.  He said that he gave her benzo diazepam at home to calm her down.  And that she normally sleeps for period of time after that.        Past Medical History:  Diagnosis Date  . Anxiety   . Diabetes mellitus with neuropathy ([place])   . Diabetic neuropathy ([place])   . DJD (degenerative joint disease)   . H/O aortic valve replacement   . Hyperlipidemia   . Hypertension   . Neuromuscular disorder (HCC)    neuropathy in feet  . Obesity   . Sleep apnea   . Stroke [facility])    " light stroke"  . Wears glasses     Patient Active Problem List   Diagnosis Date Noted  . Other secondary pulmonary hypertension ([place]) 10/28/2019  . Acute on chronic combined systolic and diastolic CHF (congestive heart failure) ([place])   . Anasarca 04/25/2019  . Hypoalbuminemia 04/25/2019  . Persistent atrial fibrillation ([place]) 04/25/2019  . CKD (chronic kidney disease), stage III ([place]) 04/25/2019  . Anemia due to chronic kidney disease 04/25/2019  . Pressure injury of skin 04/24/2019  . Fall 04/23/2019  . S/P right knee arthroscopy 04/13/2015  .  OSA (obstructive sleep apnea) 01/01/2013  . Diabetes ([place]) 10/23/2012  . Aortic valve disorder 06/30/2010  . CVA (cerebral vascular accident) ([place]) 06/30/2010  . S/P aortic valve replacement 06/30/2010  . Hyperlipidemia 09/29/2008  . Obesity 09/29/2008  . Essential hypertension 09/29/2008  . DEGENERATIVE JOINT DISEASE 09/29/2008    Past Surgical History:  Procedure Laterality Date  . ANKLE SURGERY     Left tendon repair  . AORTIC VALVE REPLACEMENT  03/1994  . CARDIAC CATHETERIZATION     1996 [facility]  . CERVICAL SPINE SURGERY    . COLONOSCOPY    . HEMORRHOID SURGERY    . IR PARACENTESIS  10/31/2019  . KNEE ARTHROSCOPY Right 04/13/2015   Procedure: RIGHT KNEE ARTHROSCOPY WITH DEBRIDEMENT AND PARTIAL MEDIAL MENISCECTOMY;  Surgeon: Mcarthur Rossetti, MD;  Location: [facility];  Service: Orthopedics;  Laterality: Right;  . KNEE ARTHROSCOPY W/ MENISCECTOMY Right 04/13/2015  . LUMBAR FUSION    . RIGHT HEART CATH N/A 10/28/2019   Procedure: RIGHT HEART CATH;  Surgeon: Leonie Man, MD;  Location: [facility];  Service: Cardiovascular;  Laterality: N/A;     OB History   No obstetric history on file.     Family History  Problem Relation Age of Onset  . Pneumonia  Sister   . Dementia Mother   . Diabetes Sister     Social History   Tobacco Use  . Smoking status: Never Smoker  . Smokeless tobacco: Never Used  Vaping Use  . Vaping Use: Never used  Substance Use Topics  . Alcohol use: No    Alcohol/week: 0.0 standard drinks  . Drug use: No    Home Medications Prior to Admission medications   Medication Sig Start Date End Date Taking? Authorizing Provider  acetaminophen (TYLENOL) 500 MG tablet Take 500 mg by mouth 2 (two) times daily as needed for mild pain or moderate pain.    Yes [provider]  ALPRAZolam Duanne Moron) 0.5 MG tablet Take 0.5 mg by mouth 3 (three) times daily as needed for anxiety. 12/31/19  Yes [provider]  bisacodyl  (DULCOLAX) 5 MG EC tablet Take 5 mg by mouth daily as needed for moderate constipation.   Yes [provider]  buPROPion (WELLBUTRIN SR) 150 MG 12 hr tablet Take 150 mg by mouth daily. 01/07/20  Yes [provider]  cetirizine (ZYRTEC) 10 MG tablet Take 10 mg by mouth at bedtime as needed for allergies.  02/16/15  Yes [provider]  fluticasone (FLONASE) 50 MCG/ACT nasal spray Place 1 spray into both nostrils as needed.  01/13/15  Yes [provider]  HYDROcodone-acetaminophen (NORCO) 10-325 MG tablet Take 1 tablet by mouth 2 (two) times daily.  12/18/15  Yes [provider]  lactulose (CHRONULAC) 10 GM/15ML solution Take 30 mLs (20 g total) by mouth daily. 11/03/19  Yes Donne Hazel, MD  pantoprazole (PROTONIX) 40 MG tablet Take 1 tablet (40 mg total) by mouth daily before breakfast. 03/21/16  Yes Rehman, Mechele Dawley, MD  simvastatin (ZOCOR) 40 MG tablet Take 40 mg by mouth daily at 8 pm.  10/16/12  Yes [provider]  vitamin B-12 (CYANOCOBALAMIN) 500 MCG tablet Take 500 mcg by mouth daily.   Yes [provider]  Vitamin D, Ergocalciferol, (DRISDOL) 1.25 MG (50000 UT) CAPS capsule Take 50,000 Units by mouth every Sunday.    Yes [provider]  warfarin (COUMADIN) 2 MG tablet Take 1 tablet daily or as directed by Coumadin clinic 08/28/19  Yes Branch, Alphonse Guild, MD    Allergies    Tape  Review of Systems   Review of Systems  Unable to perform ROS: Mental status change    Physical Exam Updated Vital Signs BP 109/61   Pulse 95   Temp 98.9 F (37.2 C) (Rectal)   Resp 20   Ht 1.727 m (5\' 8" )   Wt 51.7 kg   SpO2 100%   BMI 17.33 kg/m   Physical Exam Vitals and nursing note reviewed.  Constitutional:      General: She is not in acute distress.    Appearance: Normal appearance. She is well-developed.  HENT:     Head: Normocephalic and atraumatic.  Eyes:     Extraocular Movements: Extraocular movements intact.      Conjunctiva/sclera: Conjunctivae normal.     Pupils: Pupils are equal, round, and reactive to light.  Cardiovascular:     Rate and Rhythm: Normal rate and regular rhythm.     Heart sounds: No murmur heard.   Pulmonary:     Effort: Pulmonary effort is normal. No respiratory distress.     Breath sounds: Normal breath sounds.  Chest:     Chest wall: No tenderness.  Abdominal:     Palpations: Abdomen is soft.  Tenderness: There is no abdominal tenderness.  Musculoskeletal:        General: Signs of injury present. Normal range of motion.     Cervical back: Normal range of motion and neck supple.  Skin:    General: Skin is warm and dry.     Comments: Multiple areas of small skin tears to both upper extremities.  And anterior legs.  Old bruising on her forehead.  Neurological:     Mental Status: She is alert.     Sensory: Sensory deficit:       Comments: Patient very somnolent.  But will move all extremities.     ED Results / Procedures / Treatments   Labs (all labs ordered are listed, but only abnormal results are displayed) Labs Reviewed  COMPREHENSIVE METABOLIC PANEL - Abnormal; Notable for the following components:      Result Value   Sodium 134 (*)    Potassium 5.4 (*)    CO2 18 (*)    BUN 45 (*)    Creatinine, Ser 2.01 (*)    Albumin 3.4 (*)    GFR, Estimated 24 (*)    All other components within normal limits  CBC WITH DIFFERENTIAL/PLATELET - Abnormal; Notable for the following components:   RBC 3.38 (*)    MCV 112.7 (*)    MCH 37.3 (*)    RDW 16.9 (*)    Eosinophils Absolute 0.8 (*)    All other components within normal limits  BRAIN NATRIURETIC PEPTIDE - Abnormal; Notable for the following components:   B Natriuretic Peptide 193.0 (*)    All other components within normal limits  RESPIRATORY PANEL BY RT PCR (FLU A&B, COVID)  URINALYSIS, ROUTINE W REFLEX MICROSCOPIC  PROTIME-INR    EKG EKG Interpretation  Date/Time:  Thursday January 15 2020 10:55:08  EDT Ventricular Rate:  103 PR Interval:    QRS Duration: 143 QT Interval:  392 QTC Calculation: 516 R Axis:   51 Text Interpretation: Sinus tachycardia Left bundle branch block Confirmed by Fredia Sorrow [phone]) on 01/15/2020 11:00:55 AM   Radiology DG Chest 1 View  Result Date: 01/15/2020 CLINICAL DATA:  Pain following recent fall EXAM: CHEST  1 VIEW COMPARISON:  January 14, 2020 FINDINGS: There is a persistent partially loculated right pleural effusion. The lungs elsewhere are clear. Heart is upper normal in size with pulmonary vascularity normal. Patient is status post median sternotomy. No adenopathy. There is aortic atherosclerosis. No appreciable bone lesions. IMPRESSION: Partially loculated right pleural effusion. Lungs elsewhere clear. Stable cardiac silhouette with postoperative changes. No pneumothorax. Aortic Atherosclerosis (ICD10-I70.0). Electronically Signed   By: Lowella Grip III M.D.   On: 01/15/2020 13:04   DG Ribs Bilateral W/Chest  Result Date: 01/14/2020 CLINICAL DATA:  Pain following recent fall EXAM: BILATERAL RIBS AND CHEST - 4+ VIEW COMPARISON:  Chest radiograph December 08, 2019 FINDINGS: Frontal chest as well as bilateral oblique and cone-down rib images obtained. There is a persistent right pleural effusion. The lungs elsewhere are clear. The heart is upper normal in size with pulmonary vascularity normal. Patient is status post median sternotomy. There is aortic atherosclerosis. There is no evident pneumothorax.  No fracture evident. IMPRESSION: Stable persistent right pleural effusion. No pneumothorax. No rib fracture appreciable. No edema or airspace opacity. Stable cardiac silhouette. Aortic Atherosclerosis (ICD10-I70.0). Electronically Signed   By: Lowella Grip III M.D.   On: 01/14/2020 14:26   CT Head Wo Contrast  Result Date: 01/15/2020 CLINICAL DATA:  Multiple falls.  Hit  head. EXAM: CT HEAD WITHOUT CONTRAST CT CERVICAL SPINE WITHOUT CONTRAST  TECHNIQUE: Multidetector CT imaging of the head and cervical spine was performed following the standard protocol without intravenous contrast. Multiplanar CT image reconstructions of the cervical spine were also generated. COMPARISON:  01/04/2020 FINDINGS: CT HEAD FINDINGS Brain: No evidence of acute large vascular territory infarction, hemorrhage, hydrocephalus, extra-axial collection or mass lesion/mass effect. Similar patchy white matter hypoattenuation, compatible with chronic microvascular ischemic disease. Tiny remote right cerebellar infarct. Generalized cerebral atrophy with ex vacuo ventricular dilation. Vascular: Calcific atherosclerosis. Skull: Normal. Negative for fracture or focal lesion. Sinuses/Orbits: Mucosal thickening of the right maxillary sinus with calcified secretions. Evidence of prior right maxillary antrostomy. Other: No mastoid effusions. CT CERVICAL SPINE FINDINGS Alignment: No acute subluxation. Skull base and vertebrae: No acute fracture. Vertebral body heights are maintained. Osteopenia. Soft tissues and spinal canal: No prevertebral fluid or swelling. No visible canal hematoma. Disc levels: Similar multilevel degenerative change. Similar C5-C7 osseous fusion. Disc bulges at C2-C3, C3-C4, C4-C5 efface ventral CSF. Upper chest: Negative. Other: Calcific atherosclerosis. IMPRESSION: CT head: 1. No evidence of acute intracranial abnormality 2. Chronic microvascular disease. 3. Findings suggestive of chronic right maxillary sinusitis. CT cervical spine: No evidence of acute fracture or traumatic malalignment. Electronically Signed   By: Margaretha Sheffield MD   On: 01/15/2020 12:55   CT Cervical Spine Wo Contrast  Result Date: 01/15/2020 CLINICAL DATA:  Multiple falls.  Hit head. EXAM: CT HEAD WITHOUT CONTRAST CT CERVICAL SPINE WITHOUT CONTRAST TECHNIQUE: Multidetector CT imaging of the head and cervical spine was performed following the standard protocol without intravenous contrast.  Multiplanar CT image reconstructions of the cervical spine were also generated. COMPARISON:  01/04/2020 FINDINGS: CT HEAD FINDINGS Brain: No evidence of acute large vascular territory infarction, hemorrhage, hydrocephalus, extra-axial collection or mass lesion/mass effect. Similar patchy white matter hypoattenuation, compatible with chronic microvascular ischemic disease. Tiny remote right cerebellar infarct. Generalized cerebral atrophy with ex vacuo ventricular dilation. Vascular: Calcific atherosclerosis. Skull: Normal. Negative for fracture or focal lesion. Sinuses/Orbits: Mucosal thickening of the right maxillary sinus with calcified secretions. Evidence of prior right maxillary antrostomy. Other: No mastoid effusions. CT CERVICAL SPINE FINDINGS Alignment: No acute subluxation. Skull base and vertebrae: No acute fracture. Vertebral body heights are maintained. Osteopenia. Soft tissues and spinal canal: No prevertebral fluid or swelling. No visible canal hematoma. Disc levels: Similar multilevel degenerative change. Similar C5-C7 osseous fusion. Disc bulges at C2-C3, C3-C4, C4-C5 efface ventral CSF. Upper chest: Negative. Other: Calcific atherosclerosis. IMPRESSION: CT head: 1. No evidence of acute intracranial abnormality 2. Chronic microvascular disease. 3. Findings suggestive of chronic right maxillary sinusitis. CT cervical spine: No evidence of acute fracture or traumatic malalignment. Electronically Signed   By: Margaretha Sheffield MD   On: 01/15/2020 12:55   DG HIPS BILAT WITH PELVIS MIN 5 VIEWS  Result Date: 01/15/2020 CLINICAL DATA:  Pain with recent falls EXAM: DG HIP (WITH OR WITHOUT PELVIS) 5+V BILAT COMPARISON:  December 07, 2019 pelvis and right hip radiograph; CT right hip joint December 07, 2019 FINDINGS: Frontal pelvis as well as frontal and lateral views of each hip joint-total 5-views obtained. Previously noted fracture of the greater trochanter on the right is currently in near anatomic  alignment. No new fracture evident. There is underlying osteoporosis. No dislocation. There is slight symmetric narrowing of each hip joint. There is a stable benign exostosis arising from the left iliac crest laterally measuring 2.3 x 1.6 cm. IMPRESSION: No appreciable change in the previously  noted fracture of the greater trochanter on the right with alignment near anatomic. No new fracture. No dislocation. Bones osteoporotic. Mild symmetric narrowing each hip joint. Electronically Signed   By: Lowella Grip III M.D.   On: 01/15/2020 13:07    Procedures Procedures (including critical care time)  Medications Ordered in ED Medications  0.9 %  sodium chloride infusion (has no administration in time range)  sodium zirconium cyclosilicate (LOKELMA) packet 10 g (has no administration in time range)  sodium chloride 0.9 % bolus 500 mL (0 mLs Intravenous Stopped 01/15/20 1434)  sodium chloride 0.9 % bolus 500 mL (500 mLs Intravenous New Bag/Given 01/15/20 1435)    ED Course  I have reviewed the triage vital signs and the nursing notes.  Pertinent labs & imaging results that were available during my care of the patient were reviewed by me and considered in my medical decision making (see chart for details).    MDM Rules/Calculators/A&P                          Tensive work-up due to the mental status change in the multiple falls.  No evidence of any acute trauma chest x-ray CT head neck x-rays of pelvis and bilateral hips.  Ever patient with significant electrolyte abnormalities potassium elevated little bit of 5.4.  Nothing superacute CO2 18.  BUN 45 creatinine 2.01 significant change in her creatinine and renal function.  Consistent with acute kidney injury.  Urinalysis still pending.  CBC without leukocytosis.  Platelet count is fine.  BNP elevated some at 193.  Covid testing is negative.  Discussed with hospitalist for admission.  Patient probably will require nursing home placement upon  discharge.  Patient receiving IV fluids.  2 including 500 cc normal saline x2.  Oxygen saturation her percent on room air.  Blood pressure originally was low 100s.  Systolic   Final Clinical Impression(s) / ED Diagnoses Final diagnoses:  Fall, initial encounter  AKI (acute kidney injury) ([place])  Altered mental status, unspecified altered mental status type    Rx / DC Orders ED Discharge Orders    None       Fredia Sorrow, MD 01/15/20 1502    Fredia Sorrow, MD 01/15/20 [id number]

## 2020-01-15 NOTE — NC FL2 (Signed)
[place] MEDICAID FL2 LEVEL OF CARE SCREENING TOOL     IDENTIFICATION  Patient Name: Mary [name] Birthdate: [birth year] Sex: female Admission Date (Current Location): 01/15/2020  [facility] and [place] Number:  [employer] and Address:  [place] [address], [place]      Provider Number: [id number]  Attending Physician Name and Address:  Roxan Hockey, MD  Relative Name and Phone Number:  Davy Faught [phone]    Current Level of Care: Hospital Recommended Level of Care: [place] [facility] Prior Approval Number:    Date Approved/Denied:   PASRR Number: [id number]  Discharge Plan: SNF    Current Diagnoses: Patient Active Problem List   Diagnosis Date Noted  . Hyperkalemia 01/15/2020  . 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 Anticoagulant long-term use 01/15/2020  . Other secondary pulmonary hypertension ([place]) 10/28/2019  . Acute on chronic combined systolic and diastolic CHF (congestive heart failure) ([place])   . Anasarca 04/25/2019  . Hypoalbuminemia 04/25/2019  . Persistent atrial fibrillation ([place]) 04/25/2019  . CKD (chronic kidney disease), stage III ([place]) 04/25/2019  . Anemia due to chronic kidney disease 04/25/2019  . Pressure injury of skin 04/24/2019  . Fall 04/23/2019  . S/P right knee arthroscopy 04/13/2015  . OSA (obstructive sleep apnea) 01/01/2013  . Diabetes ([place]) 10/23/2012  . Aortic valve disorder 06/30/2010  . CVA (cerebral vascular accident) ([place]) 06/30/2010  . S/P aortic valve replacement 06/30/2010  . Hyperlipidemia 09/29/2008  . Obesity 09/29/2008  . Essential hypertension 09/29/2008  . DEGENERATIVE JOINT DISEASE 09/29/2008    Orientation RESPIRATION BLADDER Height & Weight     Self, Place  Normal Continent Weight: 114 lb (51.7 kg) Height:  5\' 8"  (172.7 cm)  BEHAVIORAL SYMPTOMS/MOOD NEUROLOGICAL BOWEL NUTRITION STATUS      Continent Diet  AMBULATORY STATUS COMMUNICATION  OF NEEDS Skin   Extensive Assist Verbally Normal, Other (Comment) (skin tears)                       Personal Care Assistance Level of Assistance  Bathing, Feeding, Dressing Bathing Assistance: Maximum assistance Feeding assistance: Limited assistance Dressing Assistance: Maximum assistance     Functional Limitations Info  Sight, Hearing, Speech Sight Info: Impaired Hearing Info: Adequate Speech Info: Adequate    SPECIAL CARE FACTORS FREQUENCY  PT (By licensed PT), OT (By licensed OT)     PT Frequency: 5 times weekly OT Frequency: 5 times weekly            Contractures Contractures Info: Not present    Additional Factors Info  Allergies, Code Status Code Status Info: FULL Allergies Info: Tape (Use paper tape only)           Current Medications (01/15/2020):  This is the current hospital active medication list Current Facility-Administered Medications  Medication Dose Route Frequency Provider Last Rate Last Admin  . 0.9 %  sodium chloride infusion   Intravenous Continuous Roxan Hockey, MD 75 mL/hr at 01/15/20 1700 New Bag at 01/15/20 1700  . 0.9 %  sodium chloride infusion  250 mL Intravenous PRN Emokpae, Courage, MD      . acetaminophen (TYLENOL) tablet 650 mg  650 mg Oral Q6H PRN Emokpae, Courage, MD       Or  . acetaminophen (TYLENOL) suppository 650 mg  650 mg Rectal Q6H PRN Emokpae, Courage, MD      . ALPRAZolam Duanne Moron) tablet 0.5 mg  0.5 mg Oral TID  PRN Roxan Hockey, MD      . bisacodyl (DULCOLAX) suppository 10 mg  10 mg Rectal Daily PRN Emokpae, Courage, MD      . buPROPion (WELLBUTRIN SR) 12 hr tablet 150 mg  150 mg Oral Daily Emokpae, Courage, MD   150 mg at 01/15/20 1846  . dextrose 5 %-0.45 % sodium chloride infusion   Intravenous Continuous Emokpae, Courage, MD 50 mL/hr at 01/15/20 1846 New Bag at 01/15/20 1846  . haloperidol lactate (HALDOL) injection 5 mg  5 mg Intramuscular Q6H PRN Emokpae, Courage, MD      . HYDROcodone-acetaminophen  (NORCO) 10-325 MG per tablet 1 tablet  1 tablet Oral BID Emokpae, Courage, MD      . lactulose (CHRONULAC) 10 GM/15ML solution 20 g  20 g Oral Daily Emokpae, Courage, MD   20 g at 01/15/20 1847  . LORazepam (ATIVAN) injection 1 mg  1 mg Intravenous Q6H PRN Emokpae, Courage, MD      . ondansetron (ZOFRAN) tablet 4 mg  4 mg Oral Q6H PRN Emokpae, Courage, MD       Or  . ondansetron (ZOFRAN) injection 4 mg  4 mg Intravenous Q6H PRN Emokpae, Courage, MD      . Derrill Memo ON 01/16/2020] pantoprazole (PROTONIX) EC tablet 40 mg  40 mg Oral QAC breakfast Emokpae, Courage, MD      . polyethylene glycol (MIRALAX / GLYCOLAX) packet 17 g  17 g Oral Daily PRN Emokpae, Courage, MD      . simvastatin (ZOCOR) tablet 40 mg  40 mg Oral Q2000 Emokpae, Courage, MD      . sodium chloride flush (NS) 0.9 % injection 3 mL  3 mL Intravenous Q12H Emokpae, Courage, MD      . sodium chloride flush (NS) 0.9 % injection 3 mL  3 mL Intravenous Q12H Emokpae, Courage, MD      . sodium chloride flush (NS) 0.9 % injection 3 mL  3 mL Intravenous PRN Emokpae, Courage, MD      . traZODone (DESYREL) tablet 50 mg  50 mg Oral QHS PRN Emokpae, Courage, MD      . vitamin B-12 (CYANOCOBALAMIN) tablet 500 mcg  500 mcg Oral Daily Emokpae, Courage, MD   500 mcg at 01/15/20 1846  . [START ON 01/18/2020] Vitamin D (Ergocalciferol) (DRISDOL) capsule 50,000 Units  50,000 Units Oral Q Silvestre Gunner, Courage, MD         Discharge Medications: Please see discharge summary for a list of discharge medications.  Relevant Imaging Results:  Relevant Lab Results:   Additional Information SS[id number] [place]

## 2020-01-15 NOTE — Progress Notes (Signed)
[place] for  Warfarin  Indication: h/o AVR, atrial fibrillation   Allergies  Allergen Reactions  . Tape Itching    Redness, Please use "paper" tape only    Patient Measurements: Height: 5\' 8"  (172.7 cm) Weight: 51.7 kg (114 lb) IBW/kg (Calculated) : 63.9  Vital Signs: Temp: 98.2 F (36.8 C) (10/28 1714) Temp Source: Axillary (10/28 1714) BP: 130/62 (10/28 2200) Pulse Rate: 91 (10/28 2200)  Labs: Recent Labs    01/15/20 1325 01/15/20 2143  HGB 12.6  --   HCT 38.1  --   PLT 185  --   LABPROT  --  39.7*  INR  --  4.3*  CREATININE 2.01*  --     Estimated Creatinine Clearance: 17.9 mL/min (A) (by C-G formula based on SCr of 2.01 mg/dL (H)).   Assessment: 79 y.o female, to ED 10/28 , had a fall at home for past 3 days, multiple skin tears, on Coumadin PTA,  pt reports she hit her head on 10/27.  Worsening dementia or change in mental status for past 2 weeks.     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  Prior to admission Warfarin dose 2mg  daily and INR goal 2.5-3.5 per anticoagulation office visit 10/22 .  Warfarin reported last taken on 01/14/20 at 20:00.   H/H 12.6/38.1, pltc 185 CT head 10/28:  No evidence of hemorrhage  INR 4.3 (supratherapeutic) tonight. No bleeding noted.   Goal of Therapy:  INR 2.5-3.5  Monitor platelets by anticoagulation protocol: Yes   Plan:  Hold coumadin tonight Daily INR  Sherlon Handing, PharmD, BCPS Please see amion for complete clinical pharmacist phone list 01/15/2020,11:31 PM

## 2020-01-16 ENCOUNTER — Inpatient Hospital Stay (HOSPITAL_COMMUNITY): Payer: Medicare Other

## 2020-01-16 LAB — CBC
HCT: 36.8 % (ref 36.0–46.0)
Hemoglobin: 11.7 g/dL — ABNORMAL LOW (ref 12.0–15.0)
MCH: 36.2 pg — ABNORMAL HIGH (ref 26.0–34.0)
MCHC: 31.8 g/dL (ref 30.0–36.0)
MCV: 113.9 fL — ABNORMAL HIGH (ref 80.0–100.0)
Platelets: 164 10*3/uL (ref 150–400)
RBC: 3.23 MIL/uL — ABNORMAL LOW (ref 3.87–5.11)
RDW: 16.7 % — ABNORMAL HIGH (ref 11.5–15.5)
WBC: 5.2 10*3/uL (ref 4.0–10.5)
nRBC: 0 % (ref 0.0–0.2)

## 2020-01-16 LAB — BASIC METABOLIC PANEL
Anion gap: 11 (ref 5–15)
BUN: 31 mg/dL — ABNORMAL HIGH (ref 8–23)
CO2: 15 mmol/L — ABNORMAL LOW (ref 22–32)
Calcium: 8.8 mg/dL — ABNORMAL LOW (ref 8.9–10.3)
Chloride: 110 mmol/L (ref 98–111)
Creatinine, Ser: 1.39 mg/dL — ABNORMAL HIGH (ref 0.44–1.00)
GFR, Estimated: 38 mL/min — ABNORMAL LOW (ref >60)
Glucose, Bld: 92 mg/dL (ref 70–99)
Potassium: 4.3 mmol/L (ref 3.5–5.1)
Sodium: 136 mmol/L (ref 135–145)

## 2020-01-16 LAB — PROTIME-INR
INR: 4.7 (ref 0.8–1.2)
Prothrombin Time: 42.9 seconds — ABNORMAL HIGH (ref 11.4–15.2)

## 2020-01-16 MED ORDER — ENSURE ENLIVE PO LIQD: 237.0000 mL | Freq: Three times a day (TID) | ORAL | Status: DC

## 2020-01-16 MED ORDER — QUETIAPINE FUMARATE 25 MG PO TABS
25.0000 mg | ORAL_TABLET | Freq: Every day | ORAL | Status: DC
  Administered 2020-01-16: 25 mg via ORAL
  Filled 2020-01-16: qty 1

## 2020-01-16 NOTE — Progress Notes (Signed)
CRITICAL VALUE ALERT  Critical Value:  INR  Date & Time Notied:  01/15/2020 2240  Provider Notified: [facility]  Orders Received/Actions taken: None

## 2020-01-16 NOTE — Plan of Care (Signed)

## 2020-01-16 NOTE — Progress Notes (Signed)
Patient Demographics:    Mary [name], is a 79 y.o. female, DOB - 1940/06/22, JME:[id number]  Admit date - 01/15/2020   Admitting Physician Heela Heishman Denton Brick, MD  Outpatient Primary MD for the patient is [name], MD  LOS - 1   Chief Complaint  Patient presents with  . Fall        Subjective:    Mary [name] today has no fevers, no emesis,  No chest pain,   -Husband at bedside -Was restless overnight requiring some sedation, -A bit more calm now  -one-to-one sitter has been discontinued  Assessment  & Plan :    Principal Problem:   AKI (acute kidney injury) on CKD 3b Active Problems:   Hyperkalemia   Essential hypertension   CVA (cerebral vascular accident) ([place])   S/P aortic valve replacement   Diabetes (HCC)   OSA (obstructive sleep apnea)   Fall   Anemia due to chronic kidney disease   Anticoagulant long-term use   Brief Summary:- 79 y.o.femalewith past medical history relevant for diastolic dysfunction CHF, HTN, status post aortic valve replacement/mechanical [employer] Jude valve, 05/1993, paroxysmal atrial fibrillation, history of prior stroke, history of DM, CKD 3b, OSA admitted on 01/15/20 hyperkalemia and worsening renal function and recurrent falls and ambulatory dysfunction  A/p 1) AKI with hyperkalemia superimposed on CKD stage IIIb---  patient received Lokelma for hyperkalemia, gentle hydration with IV fluids for AKI -Be judicious with IV fluids due to underlying CHF -Renally adjust medications, avoid nephrotoxic agents / dehydration / hypotension -Potassium is down to 4.3 from 5.4 after Lokelma -Creatinine is down to 1.39 from 2.0 with IV fluids  2)Recurrent Falls/ambulatory dysfunction--need to rule out syncopal episodes -Hip Xrays with previously noted fracture of the greater trochanter on the right with alignment near anatomic--- -CT C-spine and  CT head without new  acute findings --Hospitalist input for admission requested by EDP due to hyperkalemia and worsening renal function and poor oral intake with concerns for dehydration and AKI resulting in recurrent falls and need to rule out possible syncope -PT eval appreciated recommends SNF rehab  3)status post aortic valve replacement with [facility] Jude AVR in March 1995 ---continue Coumadin therapy for prostatic aortic valve  -recent echo from August 2021 without significant valvular problems at this time -Pharmacy to help manage Coumadin therapy  4)HFpEF/patient with history of combined systolic and diastolic Dysfunction CHF Exac--- in theSetting of mildpulmonary HTN -patient underwent RHC on 10/28/2019 with only mild pulmonary hypertension -Echo from August 2021 with EF of 55 to 60% -Chest x-ray with partial loculated right pleural effusion--per radiologist Dr. Thornton Papas this is not amenable to thoracentesis  5)OSA---history of noncompliance with CPAP  6)history of paroxysmal A. fib and prior CVA--- pharmacy to help manage Coumadin therapy -Metoprolol for rate control  7)history of cirrhosis on prior imaging studies---does not appear to have had work-up for cirrhosis, abdominal ultrasound consistent with liver cirrhosis with ascites  --Viral hepatitis profile negative, TSH normal,  -Patient has had prior paracentensis  8)chronic anemia----Hgbcurrently stable >> 9 which is patient's baseline  9)DM2-A1c is 5.1 reflecting excellent diabetic control PTA, Use Novolog/Humalog Sliding scale insulin with Accu-Cheks/Fingersticks as ordered  10)Social/Ethics--patient will benefits from further palliative care conversations regarding goals of care and advanced directives  Disposition/Need for in-Hospital Stay- patient unable to be discharged at this time due to--worsening renal function with AKI on CKD 3B with hyperkalemia requiring IV fluids and Lokelma  Remains inpatient appropriate  because:worsening renal function with AKI on CKD 3B with hyperkalemia requiring IV fluids and Lokelma   Dispo: The patient is from:Home Anticipated d/c is to:SNF Anticipated d/c date is: 2 days Patient currently is not medically stable to d/c. Barriers: Not Clinically Stable-worsening renal function with AKI on CKD 3B with hyperkalemia requiring IV fluids and Lokelma  Code Status:Full  Family Communication: (patient is alert, awake and coherent)  =-Discussed with husband at bedside  Consults :na  Disposition/Need for in-Hospital Stay- patient unable to be discharged at this time due to [facility] if behavioral issues improve*  Status is: Inpatient  Remains inpatient appropriate because:[facility] if behavioral issues improve*   Disposition: The patient is from: Home              Anticipated d/c is to: SNF              Anticipated d/c date is: 1 day              Patient currently is not medically stable to d/c. Barriers: Not Clinically Stable- [facility] if behavioral issues improve*  Code Status : full  Family Communication:   Discussed with husband at bedside Consults  : Palliative care consult requested  DVT Prophylaxis  : Coumadin- SCDs   Lab Results  Component Value Date   PLT 164 01/16/2020    Inpatient Medications  Scheduled Meds: . buPROPion  150 mg Oral Daily  . feeding supplement  237 mL Oral TID  . HYDROcodone-acetaminophen  1 tablet Oral BID  . lactulose  20 g Oral Daily  . pantoprazole  40 mg Oral QAC breakfast  . QUEtiapine  25 mg Oral QHS  . simvastatin  40 mg Oral Q2000  . sodium chloride flush  3 mL Intravenous Q12H  . sodium chloride flush  3 mL Intravenous Q12H  . vitamin B-12  500 mcg Oral Daily  . [START ON 01/18/2020] Vitamin D (Ergocalciferol)  50,000 Units Oral Q Sun  . Warfarin - Pharmacist Dosing Inpatient   Does not apply  q1600   Continuous Infusions: . sodium chloride    . dextrose 5 % and 0.45% NaCl 30 mL/hr at 01/16/20 1649   PRN Meds:.sodium chloride, acetaminophen **OR** acetaminophen, ALPRAZolam, bisacodyl, haloperidol lactate, LORazepam, ondansetron **OR** ondansetron (ZOFRAN) IV, polyethylene glycol, sodium chloride flush    Anti-infectives (From admission, onward)   None        Objective:   Vitals:   01/15/20 2047 01/15/20 2200 01/16/20 0511 01/16/20 1618  BP:  130/62 127/62 137/69  Pulse:  91 (!) 102 (!) 104  Resp:  20 18 19   Temp:   97.8 F (36.6 C) 98 F (36.7 C)  TempSrc:   Axillary Axillary  SpO2: (!) 89%  90% 96%  Weight:      Height:        Wt Readings from Last 3 Encounters:  01/15/20 51.7 kg  01/06/20 51.7 kg  01/04/20 65.8 kg     Intake/Output Summary (Last 24 hours) at 01/16/2020 1817 Last data filed at 01/16/2020 1500 Gross per 24 hour  Intake 1212.44 ml  Output --  Net 1212.44 ml     Physical Exam  Gen:- Awake Alert, episodes of confusion and disorientation HEENT:- [place].AT, No sclera icterus  Neck-Supple Neck,No JVD,.  Lungs-  CTAB , fair symmetrical air movement CV- S1, S2 normal, irregular Abd-  +ve B.Sounds, Abd Soft, No tenderness,    Extremity/Skin:- No  edema, pedal pulses present  Psych-episodes of confusion and disorientation Neuro-generalized weakness, no new focal deficits, no tremors   Data Review:   Micro Results Recent Results (from the past 240 hour(s))  Respiratory Panel by RT PCR (Flu A&B, Covid) - Nasopharyngeal Swab     Status: None   Collection Time: 01/15/20  1:25 PM   Specimen: Nasopharyngeal Swab  Result Value Ref Range Status   SARS Coronavirus 2 by RT PCR NEGATIVE NEGATIVE Final    Comment: (NOTE) SARS-CoV-2 target nucleic acids are NOT DETECTED.  The SARS-CoV-2 RNA is generally detectable in upper respiratoy specimens during the acute phase of infection. The lowest concentration of SARS-CoV-2 viral copies this assay  can detect is 131 copies/mL. A negative result does not preclude SARS-Cov-2 infection and should not be used as the sole basis for treatment or other patient management decisions. A negative result may occur with  improper specimen collection/handling, submission of specimen other than nasopharyngeal swab, presence of viral mutation(s) within the areas targeted by this assay, and inadequate number of viral copies (<131 copies/mL). A negative result must be combined with clinical observations, patient history, and epidemiological information. The expected result is Negative.  Fact Sheet for Patients:  PinkCheek.be  Fact Sheet for Healthcare Providers:  GravelBags.it  This test is no t yet approved or cleared by the [place] FDA and  has been authorized for detection and/or diagnosis of SARS-CoV-2 by FDA under an Emergency Use Authorization (EUA). This EUA will remain  in effect (meaning this test can be used) for the duration of the COVID-19 declaration under Section 564(b)(1) of the Act, 21 U.S.C. section 360bbb-3(b)(1), unless the authorization is terminated or revoked sooner.     Influenza A by PCR NEGATIVE NEGATIVE Final   Influenza B by PCR NEGATIVE NEGATIVE Final    Comment: (NOTE) The Xpert Xpress SARS-CoV-2/FLU/RSV assay is intended as an aid in  the diagnosis of influenza from Nasopharyngeal swab specimens and  should not be used as a sole basis for treatment. Nasal washings and  aspirates are unacceptable for Xpert Xpress SARS-CoV-2/FLU/RSV  testing.  Fact Sheet for Patients: PinkCheek.be  Fact Sheet for Healthcare Providers: GravelBags.it  This test is not yet approved or cleared by the [place] FDA and  has been authorized for detection and/or diagnosis of SARS-CoV-2 by  FDA under an Emergency Use Authorization (EUA). This EUA will remain   in effect (meaning this test can be used) for the duration of the  Covid-19 declaration under Section 564(b)(1) of the Act, 21  U.S.C. section 360bbb-3(b)(1), unless the authorization is  terminated or revoked. Performed at [facility], [address], [place], [place] [address]     Radiology Reports DG Chest 1 View  Result Date: 01/15/2020 CLINICAL DATA:  Pain following recent fall EXAM: CHEST  1 VIEW COMPARISON:  January 14, 2020 FINDINGS: There is a persistent partially loculated right pleural effusion. The lungs elsewhere are clear. Heart is upper normal in size with pulmonary vascularity normal. Patient is status post median sternotomy. No adenopathy. There is aortic atherosclerosis. No appreciable bone lesions. IMPRESSION: Partially loculated right pleural effusion. Lungs elsewhere clear. Stable cardiac silhouette with postoperative changes. No pneumothorax. Aortic Atherosclerosis (ICD10-I70.0). Electronically Signed   By: Lowella Grip III M.D.   On: 01/15/2020 13:04  DG Ribs Bilateral W/Chest  Result Date: 01/14/2020 CLINICAL DATA:  Pain following recent fall EXAM: BILATERAL RIBS AND CHEST - 4+ VIEW COMPARISON:  Chest radiograph December 08, 2019 FINDINGS: Frontal chest as well as bilateral oblique and cone-down rib images obtained. There is a persistent right pleural effusion. The lungs elsewhere are clear. The heart is upper normal in size with pulmonary vascularity normal. Patient is status post median sternotomy. There is aortic atherosclerosis. There is no evident pneumothorax.  No fracture evident. IMPRESSION: Stable persistent right pleural effusion. No pneumothorax. No rib fracture appreciable. No edema or airspace opacity. Stable cardiac silhouette. Aortic Atherosclerosis (ICD10-I70.0). Electronically Signed   By: Lowella Grip III M.D.   On: 01/14/2020 14:26   DG Elbow Complete Right  Result Date: 01/04/2020 CLINICAL DATA:  Fall, arm pain, skin tears EXAM: RIGHT  ELBOW - COMPLETE 3+ VIEW COMPARISON:  None. FINDINGS: No acute bony abnormality. Specifically, no fracture, subluxation, or dislocation. No joint effusion. Soft tissues intact. IMPRESSION: No bony abnormality. Electronically Signed   By: Rolm Baptise M.D.   On: 01/04/2020 22:09   DG Forearm Right  Result Date: 01/04/2020 CLINICAL DATA:  Fall, pain EXAM: RIGHT FOREARM - 2 VIEW COMPARISON:  None. FINDINGS: There is no evidence of fracture or other focal bone lesions. Soft tissues are unremarkable. IMPRESSION: Negative. Electronically Signed   By: Rolm Baptise M.D.   On: 01/04/2020 22:10   DG Tibia/Fibula Left  Result Date: 01/04/2020 CLINICAL DATA:  [place] in the yard this afternoon. Pain and skin tears. EXAM: LEFT TIBIA AND FIBULA - 2 VIEW COMPARISON:  Left knee 07/21/2013 FINDINGS: There is no evidence of fracture or other focal bone lesions. Soft tissues are unremarkable. Prominent vascular calcifications. IMPRESSION: No acute bony abnormalities. Electronically Signed   By: Lucienne Capers M.D.   On: 01/04/2020 22:11   CT Head Wo Contrast  Result Date: 01/15/2020 CLINICAL DATA:  Multiple falls.  Hit head. EXAM: CT HEAD WITHOUT CONTRAST CT CERVICAL SPINE WITHOUT CONTRAST TECHNIQUE: Multidetector CT imaging of the head and cervical spine was performed following the standard protocol without intravenous contrast. Multiplanar CT image reconstructions of the cervical spine were also generated. COMPARISON:  01/04/2020 FINDINGS: CT HEAD FINDINGS Brain: No evidence of acute large vascular territory infarction, hemorrhage, hydrocephalus, extra-axial collection or mass lesion/mass effect. Similar patchy white matter hypoattenuation, compatible with chronic microvascular ischemic disease. Tiny remote right cerebellar infarct. Generalized cerebral atrophy with ex vacuo ventricular dilation. Vascular: Calcific atherosclerosis. Skull: Normal. Negative for fracture or focal lesion. Sinuses/Orbits: Mucosal thickening  of the right maxillary sinus with calcified secretions. Evidence of prior right maxillary antrostomy. Other: No mastoid effusions. CT CERVICAL SPINE FINDINGS Alignment: No acute subluxation. Skull base and vertebrae: No acute fracture. Vertebral body heights are maintained. Osteopenia. Soft tissues and spinal canal: No prevertebral fluid or swelling. No visible canal hematoma. Disc levels: Similar multilevel degenerative change. Similar C5-C7 osseous fusion. Disc bulges at C2-C3, C3-C4, C4-C5 efface ventral CSF. Upper chest: Negative. Other: Calcific atherosclerosis. IMPRESSION: CT head: 1. No evidence of acute intracranial abnormality 2. Chronic microvascular disease. 3. Findings suggestive of chronic right maxillary sinusitis. CT cervical spine: No evidence of acute fracture or traumatic malalignment. Electronically Signed   By: Margaretha Sheffield MD   On: 01/15/2020 12:55   CT Head Wo Contrast  Result Date: 01/04/2020 CLINICAL DATA:  79 year old female with head trauma. EXAM: CT HEAD WITHOUT CONTRAST CT CERVICAL SPINE WITHOUT CONTRAST TECHNIQUE: Multidetector CT imaging of the head and cervical spine was  performed following the standard protocol without intravenous contrast. Multiplanar CT image reconstructions of the cervical spine were also generated. COMPARISON:  Head CT dated 03/02/2019. FINDINGS: Evaluation of this exam is limited due to motion artifact. CT HEAD FINDINGS Brain: There is moderate age-related atrophy and chronic microvascular ischemic changes. There is no acute intracranial hemorrhage. No mass effect or midline shift. No extra-axial fluid collection. Vascular: No hyperdense vessel or unexpected calcification. Skull: Normal. Negative for fracture or focal lesion. Sinuses/Orbits: No acute finding. Other: None CT CERVICAL SPINE FINDINGS Alignment: No acute subluxation. Skull base and vertebrae: No acute fracture.  Osteopenia. Soft tissues and spinal canal: No prevertebral fluid or swelling.  No visible canal hematoma. Disc levels: Multilevel degenerative changes. There is C5-C7 bony fusion. Upper chest: Partially visualized 6 mm nodule in the left upper lobe. Dedicated chest CT may provide better evaluation on a nonemergent/outpatient basis. Other: Bilateral carotid bulb calcified plaques. IMPRESSION: 1. No acute intracranial pathology. Moderate age-related atrophy and chronic microvascular ischemic changes. 2. No acute/traumatic cervical spine pathology. 3. A 6 mm left upper lobe nodule. Further evaluation with chest CT on a nonemergent/outpatient basis recommended. Electronically Signed   By: Anner Crete M.D.   On: 01/04/2020 21:54   CT Cervical Spine Wo Contrast  Result Date: 01/15/2020 CLINICAL DATA:  Multiple falls.  Hit head. EXAM: CT HEAD WITHOUT CONTRAST CT CERVICAL SPINE WITHOUT CONTRAST TECHNIQUE: Multidetector CT imaging of the head and cervical spine was performed following the standard protocol without intravenous contrast. Multiplanar CT image reconstructions of the cervical spine were also generated. COMPARISON:  01/04/2020 FINDINGS: CT HEAD FINDINGS Brain: No evidence of acute large vascular territory infarction, hemorrhage, hydrocephalus, extra-axial collection or mass lesion/mass effect. Similar patchy white matter hypoattenuation, compatible with chronic microvascular ischemic disease. Tiny remote right cerebellar infarct. Generalized cerebral atrophy with ex vacuo ventricular dilation. Vascular: Calcific atherosclerosis. Skull: Normal. Negative for fracture or focal lesion. Sinuses/Orbits: Mucosal thickening of the right maxillary sinus with calcified secretions. Evidence of prior right maxillary antrostomy. Other: No mastoid effusions. CT CERVICAL SPINE FINDINGS Alignment: No acute subluxation. Skull base and vertebrae: No acute fracture. Vertebral body heights are maintained. Osteopenia. Soft tissues and spinal canal: No prevertebral fluid or swelling. No visible canal  hematoma. Disc levels: Similar multilevel degenerative change. Similar C5-C7 osseous fusion. Disc bulges at C2-C3, C3-C4, C4-C5 efface ventral CSF. Upper chest: Negative. Other: Calcific atherosclerosis. IMPRESSION: CT head: 1. No evidence of acute intracranial abnormality 2. Chronic microvascular disease. 3. Findings suggestive of chronic right maxillary sinusitis. CT cervical spine: No evidence of acute fracture or traumatic malalignment. Electronically Signed   By: Margaretha Sheffield MD   On: 01/15/2020 12:55   CT Cervical Spine Wo Contrast  Result Date: 01/04/2020 CLINICAL DATA:  79 year old female with head trauma. EXAM: CT HEAD WITHOUT CONTRAST CT CERVICAL SPINE WITHOUT CONTRAST TECHNIQUE: Multidetector CT imaging of the head and cervical spine was performed following the standard protocol without intravenous contrast. Multiplanar CT image reconstructions of the cervical spine were also generated. COMPARISON:  Head CT dated 03/02/2019. FINDINGS: Evaluation of this exam is limited due to motion artifact. CT HEAD FINDINGS Brain: There is moderate age-related atrophy and chronic microvascular ischemic changes. There is no acute intracranial hemorrhage. No mass effect or midline shift. No extra-axial fluid collection. Vascular: No hyperdense vessel or unexpected calcification. Skull: Normal. Negative for fracture or focal lesion. Sinuses/Orbits: No acute finding. Other: None CT CERVICAL SPINE FINDINGS Alignment: No acute subluxation. Skull base and vertebrae: No acute fracture.  Osteopenia. Soft tissues and spinal canal: No prevertebral fluid or swelling. No visible canal hematoma. Disc levels: Multilevel degenerative changes. There is C5-C7 bony fusion. Upper chest: Partially visualized 6 mm nodule in the left upper lobe. Dedicated chest CT may provide better evaluation on a nonemergent/outpatient basis. Other: Bilateral carotid bulb calcified plaques. IMPRESSION: 1. No acute intracranial pathology. Moderate  age-related atrophy and chronic microvascular ischemic changes. 2. No acute/traumatic cervical spine pathology. 3. A 6 mm left upper lobe nodule. Further evaluation with chest CT on a nonemergent/outpatient basis recommended. Electronically Signed   By: Anner Crete M.D.   On: 01/04/2020 21:54   [place] CHEST (PLEURAL EFFUSION)  Result Date: 01/16/2020 CLINICAL DATA:  RIGHT pleural effusion, assessment for thoracentesis EXAM: CHEST ULTRASOUND COMPARISON:  Chest radiograph 01/15/2020 FINDINGS: Small RIGHT pleural effusion, insufficient in volume for thoracentesis. No LEFT pleural effusion. IMPRESSION: Small RIGHT pleural effusion, insufficient for thoracentesis. Electronically Signed   By: Lavonia Dana M.D.   On: 01/16/2020 10:08   DG HIPS BILAT WITH PELVIS MIN 5 VIEWS  Result Date: 01/15/2020 CLINICAL DATA:  Pain with recent falls EXAM: DG HIP (WITH OR WITHOUT PELVIS) 5+V BILAT COMPARISON:  December 07, 2019 pelvis and right hip radiograph; CT right hip joint December 07, 2019 FINDINGS: Frontal pelvis as well as frontal and lateral views of each hip joint-total 5-views obtained. Previously noted fracture of the greater trochanter on the right is currently in near anatomic alignment. No new fracture evident. There is underlying osteoporosis. No dislocation. There is slight symmetric narrowing of each hip joint. There is a stable benign exostosis arising from the left iliac crest laterally measuring 2.3 x 1.6 cm. IMPRESSION: No appreciable change in the previously noted fracture of the greater trochanter on the right with alignment near anatomic. No new fracture. No dislocation. Bones osteoporotic. Mild symmetric narrowing each hip joint. Electronically Signed   By: Lowella Grip III M.D.   On: 01/15/2020 13:07     CBC Recent Labs  Lab 01/15/20 1325 01/16/20 0455  WBC 6.6 5.2  HGB 12.6 11.7*  HCT 38.1 36.8  PLT 185 164  MCV 112.7* 113.9*  MCH 37.3* 36.2*  MCHC 33.1 31.8  RDW 16.9* 16.7*   LYMPHSABS 1.6  --   MONOABS 0.5  --   EOSABS 0.8*  --   BASOSABS 0.1  --     Chemistries  Recent Labs  Lab 01/15/20 1325 01/16/20 0455  NA 134* 136  K 5.4* 4.3  CL 107 110  CO2 18* 15*  GLUCOSE 80 92  BUN 45* 31*  CREATININE 2.01* 1.39*  CALCIUM 9.1 8.8*  AST 29  --   ALT 15  --   ALKPHOS 90  --   BILITOT 1.2  --    ------------------------------------------------------------------------------------------------------------------ No results for input(s): CHOL, HDL, LDLCALC, TRIG, CHOLHDL, LDLDIRECT in the last 72 hours.  Lab Results  Component Value Date   HGBA1C 5.1 10/23/2019   ------------------------------------------------------------------------------------------------------------------ No results for input(s): TSH, T4TOTAL, T3FREE, THYROIDAB in the last 72 hours.  Invalid input(s): FREET3 ------------------------------------------------------------------------------------------------------------------ No results for input(s): VITAMINB12, FOLATE, FERRITIN, TIBC, IRON, RETICCTPCT in the last 72 hours.  Coagulation profile Recent Labs  Lab 01/15/20 2143 01/16/20 0455  INR 4.3* 4.7*    No results for input(s): DDIMER in the last 72 hours.  Cardiac Enzymes No results for input(s): CKMB, TROPONINI, MYOGLOBIN in the last 168 hours.  Invalid input(s): CK ------------------------------------------------------------------------------------------------------------------    Component Value Date/Time   BNP 193.0 (H) 01/15/2020 1325  Roxan Hockey M.D on 01/16/2020 at 6:17 PM  Go to [email or web address] - for contact info  [facility] - Office  [phone]

## 2020-01-16 NOTE — Progress Notes (Signed)
CRITICAL VALUE STICKER  CRITICAL VALUE: INR 4.7  RECEIVER (on-site recipient of call): Tonita Bills,   [place] NOTIFIED: 01/16/2020  MESSENGER (representative from lab):   Kristeen Miss  MD NOTIFIED: Pharmacy   RESPONSE: none

## 2020-01-16 NOTE — TOC Progression Note (Signed)
Transition of Care [facility]) - Progression Note    Patient Details  Name: Mary [name] MRN: [id number] Date of Birth: [birth year]  Transition [facility] CM/SW Contact  Shade Flood, LCSW Phone Number: 01/16/2020, 4:12 PM  Clinical Narrative:     TOC following. Reviewed bed offers with pt's husband who has chosen [facility]. Pt has insurance authorization from 01/16/20-01/20/20. Updated Sarah at [facility] and they can accept pt over the weekend if she is stable and behaviorally appropriate. If pt discharges over the weekend, she will not need another negative covid result. If she is hospitalized into next week, she will need a new negative result prior to dc. Updated weekend attending.  If pt dc's over the weekend, Judson Roch asks that weekend [facility] call the facility main number to update. Rn can also call report to the main number [phone]. [facility] will also need the dc clinical faxed to them at [phone] as they do not have anyone there over the weekend who can pull it from the [place] hub. Weekend TOC updated and will follow for dc planning.  Expected Discharge Plan: [place] Barriers to Discharge: Continued Medical Work up  Expected Discharge Plan and Services Expected Discharge Plan: [place] In-house Referral: Clinical Social Work Discharge Planning Services: NA Post Acute Care Choice: [place] Living arrangements for the past 2 months: Single Family Home                 DME Arranged: N/A DME Agency: NA       HH Arranged: NA HH Agency: NA         Social Determinants of Health (SDOH) Interventions    Readmission Risk Interventions No flowsheet data found.

## 2020-01-16 NOTE — Progress Notes (Signed)
BIL soft wrist restraints discontinued. patient resting quietly in bed, husband at bedside. Safety sitter at bedside. Will continue to monitor.

## 2020-01-16 NOTE — Progress Notes (Signed)
Consent signed by husband Heiley Shaikh for possible thoracentesis today.

## 2020-01-16 NOTE — Progress Notes (Signed)
[place] for  Warfarin  Indication: h/o AVR, atrial fibrillation   Allergies  Allergen Reactions  . Tape Itching    Redness, Please use "paper" tape only    Patient Measurements: Height: 5\' 8"  (172.7 cm) Weight: 51.7 kg (114 lb) IBW/kg (Calculated) : 63.9  Vital Signs: Temp: 97.8 F (36.6 C) (10/29 0511) Temp Source: Axillary (10/29 0511) BP: 127/62 (10/29 0511) Pulse Rate: 102 (10/29 0511)  Labs: Recent Labs    01/15/20 1325 01/15/20 2143 01/16/20 0455  HGB 12.6  --  11.7*  HCT 38.1  --  36.8  PLT 185  --  164  LABPROT  --  39.7* 42.9*  INR  --  4.3* 4.7*  CREATININE 2.01*  --  1.39*    Estimated Creatinine Clearance: 25.9 mL/min (A) (by C-G formula based on SCr of 1.39 mg/dL (H)).   Assessment: 79 y.o female, to ED 10/28 , had a fall at home for past 3 days, multiple skin tears, on Coumadin PTA,  pt reports she hit her head on 10/27.  Worsening dementia or change in mental status for past 2 weeks.     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  Prior to admission Warfarin dose 2mg  daily and INR goal 2.5-3.5 per antic-oagulation office visit 10/22.  Warfarin reported last taken on 01/14/20 at 20:00.     CT head 10/28:  No evidence of hemorrhage  01/16/20 Update INR: 4.7-->still supra-therapeutic CBC: Hb 11.7   Plates 185>164   Goal of Therapy:  INR 2.5-3.5  Monitor platelets by anticoagulation protocol: Yes   Plan:  Hold warfarin tonight for INR 4.7 Daily INR and every other day CBC  Despina Pole, Pharm. D. Clinical Pharmacist 01/16/2020 2:44 PM

## 2020-01-16 NOTE — Care Management Important Message (Signed)
Important Message  Patient Details  Name: Mary [name] MRN: [id number] Date of Birth: 06/30/1940   Medicare Important Message Given:  Yes - Important Message mailed due to current National Emergency     Tommy Medal 01/16/2020, 5:25 PM

## 2020-01-16 NOTE — Progress Notes (Signed)
Patient continued being combative and agitated. Attempting to climb out of bed with safety sitter. Patient has history of multiple falls. Notified midlevel of ineffectiveness of ativan and haldol. Order received for soft limb restraints. Intervention initiated

## 2020-01-17 DIAGNOSIS — N183 Chronic kidney disease, stage 3 unspecified: Secondary | ICD-10-CM

## 2020-01-17 DIAGNOSIS — Z952 Presence of prosthetic heart valve: Secondary | ICD-10-CM

## 2020-01-17 DIAGNOSIS — D631 Anemia in chronic kidney disease: Secondary | ICD-10-CM

## 2020-01-17 DIAGNOSIS — G4733 Obstructive sleep apnea (adult) (pediatric): Secondary | ICD-10-CM

## 2020-01-17 DIAGNOSIS — I10 Essential (primary) hypertension: Secondary | ICD-10-CM

## 2020-01-17 DIAGNOSIS — E875 Hyperkalemia: Secondary | ICD-10-CM

## 2020-01-17 LAB — CBC
HCT: 31.1 % — ABNORMAL LOW (ref 36.0–46.0)
Hemoglobin: 10.4 g/dL — ABNORMAL LOW (ref 12.0–15.0)
MCH: 37.7 pg — ABNORMAL HIGH (ref 26.0–34.0)
MCHC: 33.4 g/dL (ref 30.0–36.0)
MCV: 112.7 fL — ABNORMAL HIGH (ref 80.0–100.0)
Platelets: 146 10*3/uL — ABNORMAL LOW (ref 150–400)
RBC: 2.76 MIL/uL — ABNORMAL LOW (ref 3.87–5.11)
RDW: 16.6 % — ABNORMAL HIGH (ref 11.5–15.5)
WBC: 4.9 10*3/uL (ref 4.0–10.5)
nRBC: 0 % (ref 0.0–0.2)

## 2020-01-17 LAB — BASIC METABOLIC PANEL
Anion gap: 7 (ref 5–15)
BUN: 23 mg/dL (ref 8–23)
CO2: 16 mmol/L — ABNORMAL LOW (ref 22–32)
Calcium: 8.3 mg/dL — ABNORMAL LOW (ref 8.9–10.3)
Chloride: 109 mmol/L (ref 98–111)
Creatinine, Ser: 1.16 mg/dL — ABNORMAL HIGH (ref 0.44–1.00)
GFR, Estimated: 47 mL/min — ABNORMAL LOW (ref >60)
Glucose, Bld: 85 mg/dL (ref 70–99)
Potassium: 4.4 mmol/L (ref 3.5–5.1)
Sodium: 132 mmol/L — ABNORMAL LOW (ref 135–145)

## 2020-01-17 LAB — PROTIME-INR
INR: 4.9 (ref 0.8–1.2)
Prothrombin Time: 44.4 seconds — ABNORMAL HIGH (ref 11.4–15.2)

## 2020-01-17 MED ORDER — QUETIAPINE FUMARATE 25 MG PO TABS: 25.0000 mg | ORAL_TABLET | Freq: Every day | ORAL | Status: DC

## 2020-01-17 MED ORDER — ALPRAZOLAM 0.5 MG PO TABS
0.5000 mg | ORAL_TABLET | Freq: Three times a day (TID) | ORAL | 0 refills | Status: DC | PRN
Start: 2020-01-17 — End: 2020-08-12

## 2020-01-17 MED ORDER — HYDROCODONE-ACETAMINOPHEN 10-325 MG PO TABS: 1.0000 | ORAL_TABLET | Freq: Two times a day (BID) | ORAL | 0 refills | Status: DC

## 2020-01-17 NOTE — Discharge Summary (Signed)
Physician Discharge Summary  Mary [name] PJK:[id number] DOB: Aug 04, 1940 DOA: 01/15/2020  PCP: [name], MD  Admit date: 01/15/2020 Discharge date: 01/17/2020  Admitted From: Home Disposition: Skilled nursing facility  Recommendations for Outpatient Follow-up:  1. Follow up with PCP in 1-2 weeks 2. Please obtain BMP/CBC in one week 3. INR currently supratherapeutic at 4.9.  Recheck INR in 48 hours, resume Coumadin if INR trending down  Discharge Condition: Stable CODE STATUS: Full code Diet recommendation: Heart healthy, carb modified  Brief/Interim Summary: 79 y.o.femalewith past medical history relevant for diastolic dysfunction CHF, HTN, status post aortic valve replacement/mechanical [facility] Jude valve, 05/1993,paroxysmal atrial fibrillation, history of prior stroke, history of DM, CKD 3b, OSA admitted on10/28/21hyperkalemia and worsening renal function and recurrent falls and ambulatory dysfunction  Discharge Diagnoses:  Principal Problem:   AKI (acute kidney injury) on CKD 3b Active Problems:   Essential hypertension   CVA (cerebral vascular accident) ([place])   S/P aortic valve replacement   Diabetes ([place])   OSA (obstructive sleep apnea)   Fall   Anemia due to chronic kidney disease   Hyperkalemia   Anticoagulant long-term use  1)AKI with hyperkalemia superimposed on CKD stage IIIb--- patient received Lokelma for hyperkalemia, gentle hydration with IV fluids for AKI -Be judicious with IV fluids due to underlying CHF -Renally adjust medications, avoid nephrotoxic agents / dehydration / hypotension -Potassium is down to 4.4 from 5.4 after Lokelma -Creatinine is down to 1.16 from 2.0 with IV fluids  2)RecurrentFalls/ambulatory dysfunction--need to rule out syncopal episodes -Hip Xrays withpreviously notedfracture of the greater trochanter on the right with alignment near anatomic--- -CT C-spineandCT head without new acute findings --Hospitalist input  for admission requested by EDP due to hyperkalemia and worsening renal function and poor oral intake with concerns for dehydrationandAKI resulting in recurrent falls and need to rule out possible syncope -PT eval appreciated recommends SNF rehab  3)status post aortic valve replacement with [facility] AVR [phone] ---continue Coumadin therapy for prostatic aortic valve  -recentechofrom August 2021without significant valvular problems at this time -Pharmacy to help manage Coumadin therapy  4)HFpEF/patient with history of combined systolic and diastolic Dysfunction CHF Exac--- in theSetting ofmildpulmonary HTN-patient underwent RHC on 10/28/2019 with only mild pulmonary hypertension -Echo from August 2021 with EF of 55 to 60% -Chest x-ray with partial loculated right pleural effusion--per radiologist Dr. Thornton Papas this is not amenable to thoracentesis -She is not febrile, short of breath or coughing -We will continue to monitor for now  5)OSA---history of noncompliance with CPAP  6)history of paroxysmal A. fib and prior CVA---pharmacy to help manage Coumadin therapy -Metoprolol for rate control -Currently INR supratherapeutic at 4.9, Coumadin currently on hold  7)history of cirrhosis on prior imaging studies---does not appear to have had work-up for cirrhosis, abdominal ultrasound consistent with liver cirrhosis with ascites --Viral hepatitis profile negative, TSH normal, -Patient has had priorparacentensis -Recommend further outpatient evaluation for cirrhosis  8)chronic anemia----Hgbcurrently stable >> 9 which is patient's baseline  9)DM2-A1c is 5.1 reflecting excellent diabetic control PTA, Use Novolog/Humalog Sliding scale insulin with Accu-Cheks/Fingersticks as ordered  10)Social/Ethics--patient will benefits from further outpatient palliative care conversations regarding goals of care and advanced directives  Discharge Instructions  Discharge  Instructions    Diet - low sodium heart healthy   Complete by: As directed    Increase activity slowly   Complete by: As directed      Allergies as of 01/17/2020      Reactions   Tape Itching   Redness,  Please use "paper" tape only      Medication List    TAKE these medications   acetaminophen 500 MG tablet Commonly known as: TYLENOL Take 500 mg by mouth 2 (two) times daily as needed for mild pain or moderate pain.   ALPRAZolam 0.5 MG tablet Commonly known as: XANAX Take 1 tablet (0.5 mg total) by mouth 3 (three) times daily as needed for anxiety.   bisacodyl 5 MG EC tablet Commonly known as: DULCOLAX Take 5 mg by mouth daily as needed for moderate constipation.   buPROPion 150 MG 12 hr tablet Commonly known as: WELLBUTRIN SR Take 150 mg by mouth daily.   cetirizine 10 MG tablet Commonly known as: ZYRTEC Take 10 mg by mouth at bedtime as needed for allergies.   fluticasone 50 MCG/ACT nasal spray Commonly known as: FLONASE Place 1 spray into both nostrils as needed.   HYDROcodone-acetaminophen 10-325 MG tablet Commonly known as: NORCO Take 1 tablet by mouth 2 (two) times daily.   lactulose 10 GM/15ML solution Commonly known as: CHRONULAC Take 30 mLs (20 g total) by mouth daily.   pantoprazole 40 MG tablet Commonly known as: PROTONIX Take 1 tablet (40 mg total) by mouth daily before breakfast.   QUEtiapine 25 MG tablet Commonly known as: SEROQUEL Take 1 tablet (25 mg total) by mouth at bedtime.   simvastatin 40 MG tablet Commonly known as: ZOCOR Take 40 mg by mouth daily at 8 pm.   vitamin B-12 500 MCG tablet Commonly known as: CYANOCOBALAMIN Take 500 mcg by mouth daily.   Vitamin D (Ergocalciferol) 1.25 MG (50000 UNIT) Caps capsule Commonly known as: DRISDOL Take 50,000 Units by mouth every Sunday.   warfarin 2 MG tablet Commonly known as: COUMADIN Take as directed. If you are unsure how to take this medication, talk to your nurse or  doctor. Original instructions: Take 1 tablet daily or as directed by Coumadin clinic       Contact information for after-discharge care    Destination    [facility] .   Service: Skilled Nursing Contact information: [address] [place] [phone]                 Allergies  Allergen Reactions  . Tape Itching    Redness, Please use "paper" tape only    Consultations:     Procedures/Studies: DG Chest 1 View  Result Date: 01/15/2020 CLINICAL DATA:  Pain following recent fall EXAM: CHEST  1 VIEW COMPARISON:  January 14, 2020 FINDINGS: There is a persistent partially loculated right pleural effusion. The lungs elsewhere are clear. Heart is upper normal in size with pulmonary vascularity normal. Patient is status post median sternotomy. No adenopathy. There is aortic atherosclerosis. No appreciable bone lesions. IMPRESSION: Partially loculated right pleural effusion. Lungs elsewhere clear. Stable cardiac silhouette with postoperative changes. No pneumothorax. Aortic Atherosclerosis (ICD10-I70.0). Electronically Signed   By: Lowella Grip III M.D.   On: 01/15/2020 13:04   DG Ribs Bilateral W/Chest  Result Date: 01/14/2020 CLINICAL DATA:  Pain following recent fall EXAM: BILATERAL RIBS AND CHEST - 4+ VIEW COMPARISON:  Chest radiograph December 08, 2019 FINDINGS: Frontal chest as well as bilateral oblique and cone-down rib images obtained. There is a persistent right pleural effusion. The lungs elsewhere are clear. The heart is upper normal in size with pulmonary vascularity normal. Patient is status post median sternotomy. There is aortic atherosclerosis. There is no evident pneumothorax.  No fracture evident. IMPRESSION: Stable persistent right pleural effusion. No pneumothorax. No rib fracture appreciable. No edema or airspace opacity. Stable cardiac silhouette. Aortic Atherosclerosis (ICD10-I70.0).  Electronically Signed   By: Lowella Grip III M.D.   On: 01/14/2020 14:26   DG Elbow Complete Right  Result Date: 01/04/2020 CLINICAL DATA:  Fall, arm pain, skin tears EXAM: RIGHT ELBOW - COMPLETE 3+ VIEW COMPARISON:  None. FINDINGS: No acute bony abnormality. Specifically, no fracture, subluxation, or dislocation. No joint effusion. Soft tissues intact. IMPRESSION: No bony abnormality. Electronically Signed   By: Rolm Baptise M.D.   On: 01/04/2020 22:09   DG Forearm Right  Result Date: 01/04/2020 CLINICAL DATA:  Fall, pain EXAM: RIGHT FOREARM - 2 VIEW COMPARISON:  None. FINDINGS: There is no evidence of fracture or other focal bone lesions. Soft tissues are unremarkable. IMPRESSION: Negative. Electronically Signed   By: Rolm Baptise M.D.   On: 01/04/2020 22:10   DG Tibia/Fibula Left  Result Date: 01/04/2020 CLINICAL DATA:  [place] in the yard this afternoon. Pain and skin tears. EXAM: LEFT TIBIA AND FIBULA - 2 VIEW COMPARISON:  Left knee 07/21/2013 FINDINGS: There is no evidence of fracture or other focal bone lesions. Soft tissues are unremarkable. Prominent vascular calcifications. IMPRESSION: No acute bony abnormalities. Electronically Signed   By: Lucienne Capers M.D.   On: 01/04/2020 22:11   CT Head Wo Contrast  Result Date: 01/15/2020 CLINICAL DATA:  Multiple falls.  Hit head. EXAM: CT HEAD WITHOUT CONTRAST CT CERVICAL SPINE WITHOUT CONTRAST TECHNIQUE: Multidetector CT imaging of the head and cervical spine was performed following the standard protocol without intravenous contrast. Multiplanar CT image reconstructions of the cervical spine were also generated. COMPARISON:  01/04/2020 FINDINGS: CT HEAD FINDINGS Brain: No evidence of acute large vascular territory infarction, hemorrhage, hydrocephalus, extra-axial collection or mass lesion/mass effect. Similar patchy white matter hypoattenuation, compatible with chronic microvascular ischemic disease. Tiny remote right cerebellar infarct.  Generalized cerebral atrophy with ex vacuo ventricular dilation. Vascular: Calcific atherosclerosis. Skull: Normal. Negative for fracture or focal lesion. Sinuses/Orbits: Mucosal thickening of the right maxillary sinus with calcified secretions. Evidence of prior right maxillary antrostomy. Other: No mastoid effusions. CT CERVICAL SPINE FINDINGS Alignment: No acute subluxation. Skull base and vertebrae: No acute fracture. Vertebral body heights are maintained. Osteopenia. Soft tissues and spinal canal: No prevertebral fluid or swelling. No visible canal hematoma. Disc levels: Similar multilevel degenerative change. Similar C5-C7 osseous fusion. Disc bulges at C2-C3, C3-C4, C4-C5 efface ventral CSF. Upper chest: Negative. Other: Calcific atherosclerosis. IMPRESSION: CT head: 1. No evidence of acute intracranial abnormality 2. Chronic microvascular disease. 3. Findings suggestive of chronic right maxillary sinusitis. CT cervical spine: No evidence of acute fracture or traumatic malalignment. Electronically Signed   By: Margaretha Sheffield MD   On: 01/15/2020 12:55   CT Head Wo Contrast  Result Date: 01/04/2020 CLINICAL DATA:  79 year old female with head trauma. EXAM: CT HEAD WITHOUT CONTRAST CT CERVICAL SPINE WITHOUT CONTRAST TECHNIQUE: Multidetector CT imaging of the head and cervical spine was performed following the standard protocol without intravenous contrast. Multiplanar CT image reconstructions of the cervical spine were also generated. COMPARISON:  Head CT dated 03/02/2019. FINDINGS: Evaluation of this exam is limited due to motion artifact. CT HEAD FINDINGS Brain: There is moderate age-related atrophy and chronic microvascular ischemic changes. There is no acute intracranial hemorrhage. No mass effect or midline shift. No extra-axial fluid collection. Vascular: No hyperdense vessel or unexpected calcification. Skull: Normal. Negative for fracture or focal lesion. Sinuses/Orbits:  No acute finding. Other:  None CT CERVICAL SPINE FINDINGS Alignment: No acute subluxation. Skull base and vertebrae: No acute fracture.  Osteopenia. Soft tissues and spinal canal: No prevertebral fluid or swelling. No visible canal hematoma. Disc levels: Multilevel degenerative changes. There is C5-C7 bony fusion. Upper chest: Partially visualized 6 mm nodule in the left upper lobe. Dedicated chest CT may provide better evaluation on a nonemergent/outpatient basis. Other: Bilateral carotid bulb calcified plaques. IMPRESSION: 1. No acute intracranial pathology. Moderate age-related atrophy and chronic microvascular ischemic changes. 2. No acute/traumatic cervical spine pathology. 3. A 6 mm left upper lobe nodule. Further evaluation with chest CT on a nonemergent/outpatient basis recommended. Electronically Signed   By: Anner Crete M.D.   On: 01/04/2020 21:54   CT Cervical Spine Wo Contrast  Result Date: 01/15/2020 CLINICAL DATA:  Multiple falls.  Hit head. EXAM: CT HEAD WITHOUT CONTRAST CT CERVICAL SPINE WITHOUT CONTRAST TECHNIQUE: Multidetector CT imaging of the head and cervical spine was performed following the standard protocol without intravenous contrast. Multiplanar CT image reconstructions of the cervical spine were also generated. COMPARISON:  01/04/2020 FINDINGS: CT HEAD FINDINGS Brain: No evidence of acute large vascular territory infarction, hemorrhage, hydrocephalus, extra-axial collection or mass lesion/mass effect. Similar patchy white matter hypoattenuation, compatible with chronic microvascular ischemic disease. Tiny remote right cerebellar infarct. Generalized cerebral atrophy with ex vacuo ventricular dilation. Vascular: Calcific atherosclerosis. Skull: Normal. Negative for fracture or focal lesion. Sinuses/Orbits: Mucosal thickening of the right maxillary sinus with calcified secretions. Evidence of prior right maxillary antrostomy. Other: No mastoid effusions. CT CERVICAL SPINE FINDINGS Alignment: No acute  subluxation. Skull base and vertebrae: No acute fracture. Vertebral body heights are maintained. Osteopenia. Soft tissues and spinal canal: No prevertebral fluid or swelling. No visible canal hematoma. Disc levels: Similar multilevel degenerative change. Similar C5-C7 osseous fusion. Disc bulges at C2-C3, C3-C4, C4-C5 efface ventral CSF. Upper chest: Negative. Other: Calcific atherosclerosis. IMPRESSION: CT head: 1. No evidence of acute intracranial abnormality 2. Chronic microvascular disease. 3. Findings suggestive of chronic right maxillary sinusitis. CT cervical spine: No evidence of acute fracture or traumatic malalignment. Electronically Signed   By: Margaretha Sheffield MD   On: 01/15/2020 12:55   CT Cervical Spine Wo Contrast  Result Date: 01/04/2020 CLINICAL DATA:  79 year old female with head trauma. EXAM: CT HEAD WITHOUT CONTRAST CT CERVICAL SPINE WITHOUT CONTRAST TECHNIQUE: Multidetector CT imaging of the head and cervical spine was performed following the standard protocol without intravenous contrast. Multiplanar CT image reconstructions of the cervical spine were also generated. COMPARISON:  Head CT dated 03/02/2019. FINDINGS: Evaluation of this exam is limited due to motion artifact. CT HEAD FINDINGS Brain: There is moderate age-related atrophy and chronic microvascular ischemic changes. There is no acute intracranial hemorrhage. No mass effect or midline shift. No extra-axial fluid collection. Vascular: No hyperdense vessel or unexpected calcification. Skull: Normal. Negative for fracture or focal lesion. Sinuses/Orbits: No acute finding. Other: None CT CERVICAL SPINE FINDINGS Alignment: No acute subluxation. Skull base and vertebrae: No acute fracture.  Osteopenia. Soft tissues and spinal canal: No prevertebral fluid or swelling. No visible canal hematoma. Disc levels: Multilevel degenerative changes. There is C5-C7 bony fusion. Upper chest: Partially visualized 6 mm nodule in the left upper  lobe. Dedicated chest CT may provide better evaluation on a nonemergent/outpatient basis. Other: Bilateral carotid bulb calcified plaques. IMPRESSION: 1. No acute intracranial pathology. Moderate age-related atrophy and chronic microvascular ischemic changes. 2. No acute/traumatic cervical spine pathology. 3. A 6 mm left upper lobe  nodule. Further evaluation with chest CT on a nonemergent/outpatient basis recommended. Electronically Signed   By: Anner Crete M.D.   On: 01/04/2020 21:54   [place] CHEST (PLEURAL EFFUSION)  Result Date: 01/16/2020 CLINICAL DATA:  RIGHT pleural effusion, assessment for thoracentesis EXAM: CHEST ULTRASOUND COMPARISON:  Chest radiograph 01/15/2020 FINDINGS: Small RIGHT pleural effusion, insufficient in volume for thoracentesis. No LEFT pleural effusion. IMPRESSION: Small RIGHT pleural effusion, insufficient for thoracentesis. Electronically Signed   By: Lavonia Dana M.D.   On: 01/16/2020 10:08   DG HIPS BILAT WITH PELVIS MIN 5 VIEWS  Result Date: 01/15/2020 CLINICAL DATA:  Pain with recent falls EXAM: DG HIP (WITH OR WITHOUT PELVIS) 5+V BILAT COMPARISON:  December 07, 2019 pelvis and right hip radiograph; CT right hip joint December 07, 2019 FINDINGS: Frontal pelvis as well as frontal and lateral views of each hip joint-total 5-views obtained. Previously noted fracture of the greater trochanter on the right is currently in near anatomic alignment. No new fracture evident. There is underlying osteoporosis. No dislocation. There is slight symmetric narrowing of each hip joint. There is a stable benign exostosis arising from the left iliac crest laterally measuring 2.3 x 1.6 cm. IMPRESSION: No appreciable change in the previously noted fracture of the greater trochanter on the right with alignment near anatomic. No new fracture. No dislocation. Bones osteoporotic. Mild symmetric narrowing each hip joint. Electronically Signed   By: Lowella Grip III M.D.   On: 01/15/2020 13:07        Subjective: Denies any chest pain or shortness of breath.  Patient was calm overnight.  She did not require any physical or chemical restraints in over 24 hours.  Discharge Exam: Vitals:   01/16/20 0511 01/16/20 1618 01/16/20 2105 01/17/20 0445  BP: 127/62 137/69 119/60 118/70  Pulse: (!) 102 (!) 104 60 99  Resp: 18 19 20 20   Temp: 97.8 F (36.6 C) 98 F (36.7 C) 97.8 F (36.6 C) 98.8 F (37.1 C)  TempSrc: Axillary Axillary    SpO2: 90% 96% 100% 100%  Weight:    52.1 kg  Height:        General: Pt is alert, awake, not in acute distress Cardiovascular: RRR, S1/S2 +, no rubs, no gallops Respiratory: CTA bilaterally, no wheezing, no rhonchi Abdominal: Soft, NT, ND, bowel sounds + Extremities: no edema, no cyanosis    The results of significant diagnostics from this hospitalization (including imaging, microbiology, ancillary and laboratory) are listed below for reference.     Microbiology: Recent Results (from the past 240 hour(s))  Respiratory Panel by RT PCR (Flu A&B, Covid) - Nasopharyngeal Swab     Status: None   Collection Time: 01/15/20  1:25 PM   Specimen: Nasopharyngeal Swab  Result Value Ref Range Status   SARS Coronavirus 2 by RT PCR NEGATIVE NEGATIVE Final    Comment: (NOTE) SARS-CoV-2 target nucleic acids are NOT DETECTED.  The SARS-CoV-2 RNA is generally detectable in upper respiratoy specimens during the acute phase of infection. The lowest concentration of SARS-CoV-2 viral copies this assay can detect is 131 copies/mL. A negative result does not preclude SARS-Cov-2 infection and should not be used as the sole basis for treatment or other patient management decisions. A negative result may occur with  improper specimen collection/handling, submission of specimen other than nasopharyngeal swab, presence of viral mutation(s) within the areas targeted by this assay, and inadequate number of viral copies (<131 copies/mL). A negative result must be  combined with clinical observations, patient history,  and epidemiological information. The expected result is Negative.  Fact Sheet for Patients:  PinkCheek.be  Fact Sheet for Healthcare Providers:  GravelBags.it  This test is no t yet approved or cleared by the [place] FDA and  has been authorized for detection and/or diagnosis of SARS-CoV-2 by FDA under an Emergency Use Authorization (EUA). This EUA will remain  in effect (meaning this test can be used) for the duration of the COVID-19 declaration under Section 564(b)(1) of the Act, 21 U.S.C. section 360bbb-3(b)(1), unless the authorization is terminated or revoked sooner.     Influenza A by PCR NEGATIVE NEGATIVE Final   Influenza B by PCR NEGATIVE NEGATIVE Final    Comment: (NOTE) The Xpert Xpress SARS-CoV-2/FLU/RSV assay is intended as an aid in  the diagnosis of influenza from Nasopharyngeal swab specimens and  should not be used as a sole basis for treatment. Nasal washings and  aspirates are unacceptable for Xpert Xpress SARS-CoV-2/FLU/RSV  testing.  Fact Sheet for Patients: PinkCheek.be  Fact Sheet for Healthcare Providers: GravelBags.it  This test is not yet approved or cleared by the [place] FDA and  has been authorized for detection and/or diagnosis of SARS-CoV-2 by  FDA under an Emergency Use Authorization (EUA). This EUA will remain  in effect (meaning this test can be used) for the duration of the  Covid-19 declaration under Section 564(b)(1) of the Act, 21  U.S.C. section 360bbb-3(b)(1), unless the authorization is  terminated or revoked. Performed at [facility], [address] [place], [place] [address]      Labs: BNP (last 3 results) Recent Labs    09/29/19 1503 10/23/19 1103 01/15/20 1325  BNP 504.0* 565.0* 833.8*   Basic Metabolic Panel: Recent Labs  Lab  01/15/20 1325 01/16/20 0455 01/17/20 0600  NA 134* 136 132*  K 5.4* 4.3 4.4  CL 107 110 109  CO2 18* 15* 16*  GLUCOSE 80 92 85  BUN 45* 31* 23  CREATININE 2.01* 1.39* 1.16*  CALCIUM 9.1 8.8* 8.3*   Liver Function Tests: Recent Labs  Lab 01/15/20 1325  AST 29  ALT 15  ALKPHOS 90  BILITOT 1.2  PROT 8.1  ALBUMIN 3.4*   No results for input(s): LIPASE, AMYLASE in the last 168 hours. No results for input(s): AMMONIA in the last 168 hours. CBC: Recent Labs  Lab 01/15/20 1325 01/16/20 0455 01/17/20 0600  WBC 6.6 5.2 4.9  NEUTROABS 3.6  --   --   HGB 12.6 11.7* 10.4*  HCT 38.1 36.8 31.1*  MCV 112.7* 113.9* 112.7*  PLT 185 164 146*   Cardiac Enzymes: No results for input(s): CKTOTAL, CKMB, CKMBINDEX, TROPONINI in the last 168 hours. BNP: Invalid input(s): POCBNP CBG: No results for input(s): GLUCAP in the last 168 hours. D-Dimer No results for input(s): DDIMER in the last 72 hours. Hgb A1c No results for input(s): HGBA1C in the last 72 hours. Lipid Profile No results for input(s): CHOL, HDL, LDLCALC, TRIG, CHOLHDL, LDLDIRECT in the last 72 hours. Thyroid function studies No results for input(s): TSH, T4TOTAL, T3FREE, THYROIDAB in the last 72 hours.  Invalid input(s): FREET3 Anemia work up No results for input(s): VITAMINB12, FOLATE, FERRITIN, TIBC, IRON, RETICCTPCT in the last 72 hours. Urinalysis    Component Value Date/Time   COLORURINE STRAW (A) 04/26/2019 2003   APPEARANCEUR CLEAR 04/26/2019 2003   LABSPEC 1.005 04/26/2019 2003   PHURINE 6.0 04/26/2019 2003   GLUCOSEU NEGATIVE 04/26/2019 2003   HGBUR NEGATIVE 04/26/2019 2003   BILIRUBINUR NEGATIVE  04/26/2019 2003   [place] 04/26/2019 2003   PROTEINUR NEGATIVE 04/26/2019 2003   UROBILINOGEN 2.0 (H) 11/01/2007 1518   NITRITE NEGATIVE 04/26/2019 2003   LEUKOCYTESUR NEGATIVE 04/26/2019 2003   Sepsis Labs Invalid input(s): PROCALCITONIN,  WBC,  LACTICIDVEN Microbiology Recent Results (from  the past 240 hour(s))  Respiratory Panel by RT PCR (Flu A&B, Covid) - Nasopharyngeal Swab     Status: None   Collection Time: 01/15/20  1:25 PM   Specimen: Nasopharyngeal Swab  Result Value Ref Range Status   SARS Coronavirus 2 by RT PCR NEGATIVE NEGATIVE Final    Comment: (NOTE) SARS-CoV-2 target nucleic acids are NOT DETECTED.  The SARS-CoV-2 RNA is generally detectable in upper respiratoy specimens during the acute phase of infection. The lowest concentration of SARS-CoV-2 viral copies this assay can detect is 131 copies/mL. A negative result does not preclude SARS-Cov-2 infection and should not be used as the sole basis for treatment or other patient management decisions. A negative result may occur with  improper specimen collection/handling, submission of specimen other than nasopharyngeal swab, presence of viral mutation(s) within the areas targeted by this assay, and inadequate number of viral copies (<131 copies/mL). A negative result must be combined with clinical observations, patient history, and epidemiological information. The expected result is Negative.  Fact Sheet for Patients:  PinkCheek.be  Fact Sheet for Healthcare Providers:  GravelBags.it  This test is no t yet approved or cleared by the [place] FDA and  has been authorized for detection and/or diagnosis of SARS-CoV-2 by FDA under an Emergency Use Authorization (EUA). This EUA will remain  in effect (meaning this test can be used) for the duration of the COVID-19 declaration under Section 564(b)(1) of the Act, 21 U.S.C. section 360bbb-3(b)(1), unless the authorization is terminated or revoked sooner.     Influenza A by PCR NEGATIVE NEGATIVE Final   Influenza B by PCR NEGATIVE NEGATIVE Final    Comment: (NOTE) The Xpert Xpress SARS-CoV-2/FLU/RSV assay is intended as an aid in  the diagnosis of influenza from Nasopharyngeal swab specimens and   should not be used as a sole basis for treatment. Nasal washings and  aspirates are unacceptable for Xpert Xpress SARS-CoV-2/FLU/RSV  testing.  Fact Sheet for Patients: PinkCheek.be  Fact Sheet for Healthcare Providers: GravelBags.it  This test is not yet approved or cleared by the [place] FDA and  has been authorized for detection and/or diagnosis of SARS-CoV-2 by  FDA under an Emergency Use Authorization (EUA). This EUA will remain  in effect (meaning this test can be used) for the duration of the  Covid-19 declaration under Section 564(b)(1) of the Act, 21  U.S.C. section 360bbb-3(b)(1), unless the authorization is  terminated or revoked. Performed at [facility], [address], [place],  [address]      Time coordinating discharge: 22mins  SIGNED:   Kathie Dike, MD  [facility] 01/17/2020, 12:16 PM   If 7PM-7AM, please contact night-coverage [email or web address]

## 2020-01-17 NOTE — TOC Transition Note (Signed)
Transition of Care [facility]) - CM/SW Discharge Note   Patient Details  Name: Mary [name] MRN: [id number] Date of Birth: 1941/03/11  [facility] CM/SW Contact:  Natasha Bence, LCSW Phone Number: 01/17/2020, 1:54 PM   Clinical Narrative:    CSW faxed discharge summary to [phone] for [facility]. Nurse to call report. Husband requested to transport patient. Per Nurse MD agreeable to transport method. TOC signing off.   Final next level of care: [place] Barriers to Discharge: Barriers Resolved   Patient Goals and CMS Choice Patient states their goals for this hospitalization and ongoing recovery are:: Rehab with SNF   Choice offered to / list presented to : Patient  Discharge Placement              Patient chooses bed at: [facility] Patient to be transferred to facility by: Spouse Name of family member notified: Magdalen Spatz Patient and family notified of of transfer: 01/17/20  Discharge Plan and Services In-house Referral: Clinical Social Work Discharge Planning Services: NA Post Acute Care Choice: [place]          DME Arranged: N/A DME Agency: NA       HH Arranged: NA HH Agency: NA        Social Determinants of Health (SDOH) Interventions     Readmission Risk Interventions No flowsheet data found.

## 2020-01-17 NOTE — Plan of Care (Signed)
  Problem: Education: Goal: Knowledge of General Education information will improve Description: Including pain rating scale, medication(s)/side effects and non-pharmacologic comfort measures 01/17/2020 1217 by Cameron Ali, RN Outcome: Completed/Met 01/17/2020 0818 by Cameron Ali, RN Outcome: Progressing   Problem: Health Behavior/Discharge Planning: Goal: Ability to manage health-related needs will improve 01/17/2020 1217 by Cameron Ali, RN Outcome: Completed/Met 01/17/2020 0818 by Cameron Ali, RN Outcome: Progressing   Problem: Clinical Measurements: Goal: Ability to maintain clinical measurements within normal limits will improve 01/17/2020 1217 by Cameron Ali, RN Outcome: Completed/Met 01/17/2020 0818 by Cameron Ali, RN Outcome: Progressing Goal: Will remain free from infection 01/17/2020 1217 by Cameron Ali, RN Outcome: Completed/Met 01/17/2020 0818 by Cameron Ali, RN Outcome: Progressing Goal: Diagnostic test results will improve 01/17/2020 1217 by Cameron Ali, RN Outcome: Completed/Met 01/17/2020 0818 by Cameron Ali, RN Outcome: Progressing Goal: Respiratory complications will improve 01/17/2020 1217 by Cameron Ali, RN Outcome: Completed/Met 01/17/2020 0818 by Cameron Ali, RN Outcome: Progressing Goal: Cardiovascular complication will be avoided 01/17/2020 1217 by Cameron Ali, RN Outcome: Completed/Met 01/17/2020 0818 by Cameron Ali, RN Outcome: Progressing   Problem: Activity: Goal: Risk for activity intolerance will decrease 01/17/2020 1217 by Cameron Ali, RN Outcome: Completed/Met 01/17/2020 0818 by Cameron Ali, RN Outcome: Progressing   Problem: Nutrition: Goal: Adequate nutrition will be maintained 01/17/2020 1217 by Cameron Ali, RN Outcome: Completed/Met 01/17/2020 0818 by Cameron Ali, RN Outcome: Progressing   Problem: Coping: Goal: Level  of anxiety will decrease 01/17/2020 1217 by Cameron Ali, RN Outcome: Completed/Met 01/17/2020 0818 by Cameron Ali, RN Outcome: Progressing   Problem: Elimination: Goal: Will not experience complications related to bowel motility 01/17/2020 1217 by Cameron Ali, RN Outcome: Completed/Met 01/17/2020 0818 by Cameron Ali, RN Outcome: Progressing Goal: Will not experience complications related to urinary retention 01/17/2020 1217 by Cameron Ali, RN Outcome: Completed/Met 01/17/2020 0818 by Cameron Ali, RN Outcome: Progressing   Problem: Pain Managment: Goal: General experience of comfort will improve 01/17/2020 1217 by Cameron Ali, RN Outcome: Completed/Met 01/17/2020 0818 by Cameron Ali, RN Outcome: Progressing   Problem: Safety: Goal: Ability to remain free from injury will improve 01/17/2020 1217 by Cameron Ali, RN Outcome: Completed/Met 01/17/2020 0818 by Cameron Ali, RN Outcome: Progressing   Problem: Skin Integrity: Goal: Risk for impaired skin integrity will decrease 01/17/2020 1217 by Cameron Ali, RN Outcome: Completed/Met 01/17/2020 0818 by Cameron Ali, RN Outcome: Progressing

## 2020-01-17 NOTE — Plan of Care (Signed)

## 2020-01-17 NOTE — Progress Notes (Signed)
Attempted to call report x3 to [facility] at [phone] but no answer again. Discharge instructions given to patient and husband at bedside. All belongings sent with pt. Answered all questions at this time.

## 2020-01-17 NOTE — Progress Notes (Signed)
CRITICAL VALUE STICKER  CRITICAL VALUE: INR 4.9  RECEIVER (on-site recipient of call): Shriyan Arakawa, [place] NOTIFIED: 01/17/2020 0755  MESSENGER (representative from lab): Carrie Mew  MD NOTIFIED: yes (pharm)  TIME OF NOTIFICATION: 01/17/2020 0757  RESPONSE: hold med

## 2020-01-17 NOTE — Progress Notes (Signed)
ANTICOAGULATION CONSULT NOTE   Pharmacy Consult for  Warfarin  Indication: h/o AVR, atrial fibrillation   Allergies  Allergen Reactions   Tape Itching    Redness, Please use "paper" tape only    Patient Measurements: Height: 5\' 8"  (172.7 cm) Weight: 52.1 kg (114 lb 13.8 oz) IBW/kg (Calculated) : 63.9  Vital Signs: Temp: 98.8 F (37.1 C) (10/30 0445) BP: 118/70 (10/30 0445) Pulse Rate: 99 (10/30 0445)  Labs: Recent Labs    01/15/20 1325 01/15/20 1325 01/15/20 2143 01/16/20 0455 01/17/20 0600  HGB 12.6   < >  --  11.7* 10.4*  HCT 38.1  --   --  36.8 31.1*  PLT 185  --   --  164 146*  LABPROT  --   --  39.7* 42.9* 44.4*  INR  --   --  4.3* 4.7* 4.9*  CREATININE 2.01*  --   --  1.39* 1.16*   < > = values in this interval not displayed.    Estimated Creatinine Clearance: 31.3 mL/min (A) (by C-G formula based on SCr of 1.16 mg/dL (H)).   Assessment: 79 y.o female, to ED 10/28 , had a fall at home for past 3 days, multiple skin tears, on Coumadin PTA,  pt reports she hit her head on 10/27.  Worsening dementia or change in mental status for past 2 weeks.     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  Prior to admission Warfarin dose 2mg  daily and INR goal 2.5-3.5 per antic-oagulation office visit 10/22.  Warfarin reported last taken on 01/14/20 at 20:00.     CT head 10/28:  No evidence of hemorrhage  01/16/20 Update INR: 4.9-->still supra-therapeutic CBC: Hb 10.4   Plates 146   Goal of Therapy:  INR 2.5-3.5  Monitor platelets by anticoagulation protocol: Yes   Plan:  Hold warfarin tonight for INR 4.9 Daily INR and every other day CBC  Margot Ables, PharmD Clinical Pharmacist 01/17/2020 8:29 AM

## 2020-02-03 ENCOUNTER — Ambulatory Visit (INDEPENDENT_AMBULATORY_CARE_PROVIDER_SITE_OTHER): Payer: Medicare Other | Admitting: *Deleted

## 2020-02-03 ENCOUNTER — Telehealth: Payer: Self-pay | Admitting: *Deleted

## 2020-02-03 DIAGNOSIS — I6302 Cerebral infarction due to thrombosis of basilar artery: Secondary | ICD-10-CM | POA: Diagnosis not present

## 2020-02-03 DIAGNOSIS — Z952 Presence of prosthetic heart valve: Secondary | ICD-10-CM

## 2020-02-03 DIAGNOSIS — I4819 Other persistent atrial fibrillation: Secondary | ICD-10-CM

## 2020-02-03 LAB — POCT INR: INR: 2.2 (ref 2.0–3.0)

## 2020-02-03 NOTE — Patient Instructions (Signed)
Pt was d/c from SNF yesterday on warfarin 2.5mg  daily (She was previously on 2mg  daily) Continue warfarin 1 tablet daily Recheck in 1 week Order given to [facility] [phone]

## 2020-02-03 NOTE — Telephone Encounter (Signed)
Pt's husband called wanting to speak with you about the pt's medication. She was just d/c from the nursing home and they changed her meds.   [phone]

## 2020-02-03 NOTE — Telephone Encounter (Signed)
Spoke to husband.  Pt was d/c home from SNF yesterday on warfarin 2.5mg  daily.  Prior to admission she was on 2mg  daily.  Just wanted to verify.  [facility] RN is coming out today to readmit pt.  Will get INR checked today and then decide on dose.  INR 2.2.  Will continue warfarin 2.5mg  daily and recheck on Monday 02/09/20.  See coumadin note.

## 2020-02-09 ENCOUNTER — Ambulatory Visit (INDEPENDENT_AMBULATORY_CARE_PROVIDER_SITE_OTHER): Payer: Medicare Other | Admitting: *Deleted

## 2020-02-09 DIAGNOSIS — Z952 Presence of prosthetic heart valve: Secondary | ICD-10-CM

## 2020-02-09 DIAGNOSIS — I639 Cerebral infarction, unspecified: Secondary | ICD-10-CM | POA: Diagnosis not present

## 2020-02-09 LAB — POCT INR: INR: 2.6 (ref 2.0–3.0)

## 2020-02-09 NOTE — Patient Instructions (Signed)
Continue warfarin 1 tablet daily Recheck in 1 week Order given to [employer] RN [facility] [phone]

## 2020-02-10 ENCOUNTER — Other Ambulatory Visit: Payer: Self-pay | Admitting: *Deleted

## 2020-02-10 MED ORDER — WARFARIN SODIUM 2.5 MG PO TABS
2.5000 mg | ORAL_TABLET | Freq: Every day | ORAL | 3 refills | Status: DC
Start: 2020-02-10 — End: 2020-08-19

## 2020-02-11 ENCOUNTER — Emergency Department (HOSPITAL_COMMUNITY): Payer: Medicare Other

## 2020-02-11 ENCOUNTER — Emergency Department (HOSPITAL_COMMUNITY)
Admission: EM | Admit: 2020-02-11 | Discharge: 2020-02-11 | Disposition: A | Discharge status: Home / Self Care | Payer: Medicare Other | Attending: Emergency Medicine | Admitting: Emergency Medicine

## 2020-02-11 ENCOUNTER — Encounter (HOSPITAL_COMMUNITY): Payer: Self-pay

## 2020-02-11 ENCOUNTER — Other Ambulatory Visit: Payer: Self-pay

## 2020-02-11 ENCOUNTER — Ambulatory Visit: Payer: Medicare Other | Admitting: Orthopedic Surgery

## 2020-02-11 DIAGNOSIS — R410 Disorientation, unspecified: Secondary | ICD-10-CM | POA: Diagnosis not present

## 2020-02-11 DIAGNOSIS — I5043 Acute on chronic combined systolic (congestive) and diastolic (congestive) heart failure: Secondary | ICD-10-CM | POA: Insufficient documentation

## 2020-02-11 DIAGNOSIS — Z79899 Other long term (current) drug therapy: Secondary | ICD-10-CM | POA: Diagnosis not present

## 2020-02-11 DIAGNOSIS — E785 Hyperlipidemia, unspecified: Secondary | ICD-10-CM | POA: Diagnosis not present

## 2020-02-11 DIAGNOSIS — I130 Hypertensive heart and chronic kidney disease with heart failure and stage 1 through stage 4 chronic kidney disease, or unspecified chronic kidney disease: Secondary | ICD-10-CM | POA: Diagnosis not present

## 2020-02-11 DIAGNOSIS — Z7901 Long term (current) use of anticoagulants: Secondary | ICD-10-CM | POA: Diagnosis not present

## 2020-02-11 DIAGNOSIS — E1122 Type 2 diabetes mellitus with diabetic chronic kidney disease: Secondary | ICD-10-CM | POA: Diagnosis not present

## 2020-02-11 DIAGNOSIS — E1169 Type 2 diabetes mellitus with other specified complication: Secondary | ICD-10-CM | POA: Diagnosis not present

## 2020-02-11 DIAGNOSIS — N1832 Chronic kidney disease, stage 3b: Secondary | ICD-10-CM | POA: Insufficient documentation

## 2020-02-11 DIAGNOSIS — E1140 Type 2 diabetes mellitus with diabetic neuropathy, unspecified: Secondary | ICD-10-CM | POA: Insufficient documentation

## 2020-02-11 DIAGNOSIS — J0100 Acute maxillary sinusitis, unspecified: Secondary | ICD-10-CM

## 2020-02-11 LAB — URINALYSIS, ROUTINE W REFLEX MICROSCOPIC
Bilirubin Urine: NEGATIVE
Glucose, UA: NEGATIVE mg/dL
Hgb urine dipstick: NEGATIVE
Ketones, ur: NEGATIVE mg/dL
Leukocytes,Ua: NEGATIVE
Nitrite: NEGATIVE
Protein, ur: NEGATIVE mg/dL
Specific Gravity, Urine: 1.01 (ref 1.005–1.030)
pH: 6 (ref 5.0–8.0)

## 2020-02-11 LAB — COMPREHENSIVE METABOLIC PANEL
ALT: 14 U/L (ref 0–44)
AST: 21 U/L (ref 15–41)
Albumin: 3.3 g/dL — ABNORMAL LOW (ref 3.5–5.0)
Alkaline Phosphatase: 100 U/L (ref 38–126)
Anion gap: 9 (ref 5–15)
BUN: 26 mg/dL — ABNORMAL HIGH (ref 8–23)
CO2: 24 mmol/L (ref 22–32)
Calcium: 9.4 mg/dL (ref 8.9–10.3)
Chloride: 104 mmol/L (ref 98–111)
Creatinine, Ser: 1.55 mg/dL — ABNORMAL HIGH (ref 0.44–1.00)
GFR, Estimated: 33 mL/min — ABNORMAL LOW (ref >60)
Glucose, Bld: 97 mg/dL (ref 70–99)
Potassium: 4 mmol/L (ref 3.5–5.1)
Sodium: 137 mmol/L (ref 135–145)
Total Bilirubin: 0.7 mg/dL (ref 0.3–1.2)
Total Protein: 8.1 g/dL (ref 6.5–8.1)

## 2020-02-11 LAB — CBC WITH DIFFERENTIAL/PLATELET
Abs Immature Granulocytes: 0.05 10*3/uL (ref 0.00–0.07)
Basophils Absolute: 0.1 10*3/uL (ref 0.0–0.1)
Basophils Relative: 1 %
Eosinophils Absolute: 1 10*3/uL — ABNORMAL HIGH (ref 0.0–0.5)
Eosinophils Relative: 17 %
HCT: 34.7 % — ABNORMAL LOW (ref 36.0–46.0)
Hemoglobin: 11.1 g/dL — ABNORMAL LOW (ref 12.0–15.0)
Immature Granulocytes: 1 %
Lymphocytes Relative: 17 %
Lymphs Abs: 1.1 10*3/uL (ref 0.7–4.0)
MCH: 37.1 pg — ABNORMAL HIGH (ref 26.0–34.0)
MCHC: 32 g/dL (ref 30.0–36.0)
MCV: 116.1 fL — ABNORMAL HIGH (ref 80.0–100.0)
Monocytes Absolute: 0.5 10*3/uL (ref 0.1–1.0)
Monocytes Relative: 8 %
Neutro Abs: 3.5 10*3/uL (ref 1.7–7.7)
Neutrophils Relative %: 56 %
Platelets: 250 10*3/uL (ref 150–400)
RBC: 2.99 MIL/uL — ABNORMAL LOW (ref 3.87–5.11)
RDW: 16.7 % — ABNORMAL HIGH (ref 11.5–15.5)
WBC: 6.2 10*3/uL (ref 4.0–10.5)
nRBC: 0 % (ref 0.0–0.2)

## 2020-02-11 LAB — PROTIME-INR
INR: 2.3 — ABNORMAL HIGH (ref 0.8–1.2)
Prothrombin Time: 24.4 seconds — ABNORMAL HIGH (ref 11.4–15.2)

## 2020-02-11 LAB — AMMONIA: Ammonia: 19 umol/L (ref 9–35)

## 2020-02-11 MED ORDER — AMOXICILLIN 500 MG PO CAPS: 500.0000 mg | ORAL_CAPSULE | Freq: Three times a day (TID) | ORAL | 0 refills | Status: DC

## 2020-02-11 NOTE — ED Triage Notes (Signed)
Pt arrives by [facility] with reports of altered mental status.  Reports that patient has a history of cirrhosis, hasn't been taking her medication since Monday.  Reports that patient was last seen  Normal on Monday, home health nurse reports that patient has been agitated, and combative.  Pt arrives calm and cooperative but confused.  Pt reports abdominal and chest pain.

## 2020-02-11 NOTE — ED Notes (Signed)
Patient given meal tray per MD approval

## 2020-02-11 NOTE — Discharge Instructions (Addendum)
Follow up with your md next week. °

## 2020-02-11 NOTE — ED Provider Notes (Signed)
[facility] EMERGENCY DEPARTMENT Provider Note   CSN: [id number] Arrival date & time: 02/11/20  1136     History Chief Complaint  Patient presents with  . Altered Mental Status    Mary [name] is a 79 y.o. female.  Patient has become more confused over the last month or so..  This is nonacute process.  Her husband states she is not much different today than usual.  The history is provided by the patient and medical records. No language interpreter was used.  Altered Mental Status Presenting symptoms: behavior changes and confusion   Severity:  Mild Most recent episode:  More than 2 days ago Episode history:  Continuous Timing:  Constant Progression:  Unable to specify Chronicity:  New Context: not alcohol use   Associated symptoms: no abdominal pain, no hallucinations, no headaches, no rash and no seizures        Past Medical History:  Diagnosis Date  . Anxiety   . Diabetes mellitus with neuropathy ([place])   . Diabetic neuropathy ([place])   . DJD (degenerative joint disease)   . H/O aortic valve replacement   . Hyperlipidemia   . Hypertension   . Neuromuscular disorder (HCC)    neuropathy in feet  . Obesity   . Sleep apnea   . Stroke [facility])    " light stroke"  . Wears glasses     Patient Active Problem List   Diagnosis Date Noted  . Hyperkalemia 01/15/2020  . 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 Anticoagulant long-term use 01/15/2020  . 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 Acute on chronic combined systolic and diastolic CHF (congestive heart failure) ([place])   . Anasarca 04/25/2019  . Hypoalbuminemia 04/25/2019  . Persistent atrial fibrillation ([place]) 04/25/2019  . CKD (chronic kidney disease), stage III ([place]) 04/25/2019  . Anemia due to chronic kidney disease 04/25/2019  . Pressure injury of skin 04/24/2019  . Fall 04/23/2019  . S/P right knee arthroscopy 04/13/2015  . OSA (obstructive sleep apnea) 01/01/2013  . Diabetes  ([place]) 10/23/2012  . Aortic valve disorder 06/30/2010  . CVA (cerebral vascular accident) ([place]) 06/30/2010  . S/P aortic valve replacement 06/30/2010  . Hyperlipidemia 09/29/2008  . Obesity 09/29/2008  . Essential hypertension 09/29/2008  . DEGENERATIVE JOINT DISEASE 09/29/2008    Past Surgical History:  Procedure Laterality Date  . ANKLE SURGERY     Left tendon repair  . AORTIC VALVE REPLACEMENT  03/1994  . CARDIAC CATHETERIZATION     1996 [facility]  . CERVICAL SPINE SURGERY    . COLONOSCOPY    . HEMORRHOID SURGERY    . IR PARACENTESIS  10/31/2019  . KNEE ARTHROSCOPY Right 04/13/2015   Procedure: RIGHT KNEE ARTHROSCOPY WITH DEBRIDEMENT AND PARTIAL MEDIAL MENISCECTOMY;  Surgeon: Mcarthur Rossetti, MD;  Location: [facility];  Service: Orthopedics;  Laterality: Right;  . KNEE ARTHROSCOPY W/ MENISCECTOMY Right 04/13/2015  . LUMBAR FUSION    . RIGHT HEART CATH N/A 10/28/2019   Procedure: RIGHT HEART CATH;  Surgeon: Leonie Man, MD;  Location: [place] [facility];  Service: Cardiovascular;  Laterality: N/A;     OB History   No obstetric history on file.     Family History  Problem Relation Age of Onset  . Pneumonia Sister   . Dementia Mother   . Diabetes Sister     Social History   Tobacco Use  . Smoking status: Never Smoker  . Smokeless tobacco: Never Used  Vaping Use  . Vaping Use: Never used  Substance Use Topics  . Alcohol use: No    Alcohol/week: 0.0 standard drinks  . Drug use: No    Home Medications Prior to Admission medications   Medication Sig Start Date End Date Taking? Authorizing Provider  acetaminophen (TYLENOL) 500 MG tablet Take 500 mg by mouth 2 (two) times daily as needed for mild pain or moderate pain.    Yes [provider]  ALPRAZolam (XANAX) 0.5 MG tablet Take 1 tablet (0.5 mg total) by mouth 3 (three) times daily as needed for anxiety. 01/17/20  Yes Kathie Dike, MD  bisacodyl (DULCOLAX) 5 MG EC tablet Take 5 mg by mouth daily  as needed for moderate constipation.   Yes [provider]  buPROPion (WELLBUTRIN SR) 150 MG 12 hr tablet Take 150 mg by mouth daily. 01/07/20  Yes [provider]  busPIRone (BUSPAR) 5 MG tablet Take 5 mg by mouth 2 (two) times daily.   Yes [provider]  cetirizine (ZYRTEC) 10 MG tablet Take 10 mg by mouth at bedtime as needed for allergies.  02/16/15  Yes [provider]  fluticasone (FLONASE) 50 MCG/ACT nasal spray Place 1 spray into both nostrils as needed.  01/13/15  Yes [provider]  HYDROcodone-acetaminophen (NORCO) 10-325 MG tablet Take 1 tablet by mouth 2 (two) times daily. 01/17/20  Yes Kathie Dike, MD  lactulose (CHRONULAC) 10 GM/15ML solution Take 30 mLs (20 g total) by mouth daily. 11/03/19  Yes Donne Hazel, MD  megestrol (MEGACE ES) 625 MG/5ML suspension Take 625 mg by mouth daily.   Yes [provider]  pantoprazole (PROTONIX) 40 MG tablet Take 1 tablet (40 mg total) by mouth daily before breakfast. 03/21/16  Yes Rehman, Mechele Dawley, MD  QUEtiapine (SEROQUEL) 25 MG tablet Take 1 tablet (25 mg total) by mouth at bedtime. 01/17/20  Yes Kathie Dike, MD  simvastatin (ZOCOR) 40 MG tablet Take 40 mg by mouth daily at 8 pm.  10/16/12  Yes [provider]  spironolactone (ALDACTONE) 100 MG tablet Take 100 mg by mouth daily. 02/05/20  Yes [provider]  torsemide (DEMADEX) 20 MG tablet Take 20 mg by mouth daily.   Yes [provider]  vitamin B-12 (CYANOCOBALAMIN) 500 MCG tablet Take 500 mcg by mouth daily.   Yes [provider]  Vitamin D, Ergocalciferol, (DRISDOL) 1.25 MG (50000 UT) CAPS capsule Take 50,000 Units by mouth every Sunday.    Yes [provider]  warfarin (COUMADIN) 2.5 MG tablet Take 1 tablet (2.5 mg total) by mouth daily. 02/10/20  Yes Branch, Alphonse Guild, MD  amoxicillin (AMOXIL) 500 MG capsule Take 1 capsule (500 mg total) by mouth 3 (three) times daily. 02/11/20    Milton Ferguson, MD    Allergies    Tape  Review of Systems   Review of Systems  Constitutional: Negative for appetite change and fatigue.  HENT: Negative for congestion, ear discharge and sinus pressure.   Eyes: Negative for discharge.  Respiratory: Negative for cough.   Cardiovascular: Negative for chest pain.  Gastrointestinal: Negative for abdominal pain and diarrhea.  Genitourinary: Negative for frequency and hematuria.  Musculoskeletal: Negative for back pain.  Skin: Negative for rash.  Neurological: Negative for seizures and headaches.  Psychiatric/Behavioral: Positive for confusion. Negative for hallucinations.    Physical Exam Updated Vital Signs BP 134/62   Pulse (!) 102   Temp 97.8 F (36.6 C) (Oral)   Resp 18  SpO2 98%   Physical Exam Vitals and nursing note reviewed.  Constitutional:      Appearance: She is well-developed.  HENT:     Head: Normocephalic.     Nose: Nose normal.  Eyes:     General: No scleral icterus.    Conjunctiva/sclera: Conjunctivae normal.  Neck:     Thyroid: No thyromegaly.  Cardiovascular:     Rate and Rhythm: Normal rate and regular rhythm.     Heart sounds: No murmur heard.  No friction rub. No gallop.   Pulmonary:     Breath sounds: No stridor. No wheezing or rales.  Chest:     Chest wall: No tenderness.  Abdominal:     General: There is no distension.     Tenderness: There is no abdominal tenderness. There is no rebound.  Musculoskeletal:        General: Normal range of motion.     Cervical back: Neck supple.  Lymphadenopathy:     Cervical: No cervical adenopathy.  Skin:    Findings: No erythema or rash.  Neurological:     Mental Status: She is alert and oriented to person, place, and time.     Motor: No abnormal muscle tone.     Coordination: Coordination normal.     Comments: Patient is oriented to person and place but not the date.  She does recognize her husband.   Psychiatric:        Behavior: Behavior  normal.     ED Results / Procedures / Treatments   Labs (all labs ordered are listed, but only abnormal results are displayed) Labs Reviewed  CBC WITH DIFFERENTIAL/PLATELET - Abnormal; Notable for the following components:      Result Value   RBC 2.99 (*)    Hemoglobin 11.1 (*)    HCT 34.7 (*)    MCV 116.1 (*)    MCH 37.1 (*)    RDW 16.7 (*)    Eosinophils Absolute 1.0 (*)    All other components within normal limits  COMPREHENSIVE METABOLIC PANEL - Abnormal; Notable for the following components:   BUN 26 (*)    Creatinine, Ser 1.55 (*)    Albumin 3.3 (*)    GFR, Estimated 33 (*)    All other components within normal limits  PROTIME-INR - Abnormal; Notable for the following components:   Prothrombin Time 24.4 (*)    INR 2.3 (*)    All other components within normal limits  AMMONIA  URINALYSIS, ROUTINE W REFLEX MICROSCOPIC    EKG None  Radiology CT Head Wo Contrast  Result Date: 02/11/2020 CLINICAL DATA:  Altered mental status EXAM: CT HEAD WITHOUT CONTRAST TECHNIQUE: Contiguous axial images were obtained from the base of the skull through the vertex without intravenous contrast. COMPARISON:  January 15, 2020 FINDINGS: Brain: Moderate diffuse atrophy is stable. There is no intracranial mass, hemorrhage, extra-axial fluid collection, or midline shift. There is small vessel disease in the centra semiovale bilaterally, more on the right than on the left, stable. Small vessel disease is noted in the anterior limbs of the internal and external capsules on the right. There is evidence of a prior small lacunar infarct in the right cerebellum. No acute infarct is appreciable. Vascular: No hyperdense vessels. There is calcification in the distal right vertebral artery and in each carotid siphon region. Skull: Bony calvarium appears intact. Sinuses/Orbits: There is opacification in a portion of the right maxillary antrum with calcification in this area. There is mucosal thickening in  several ethmoid air cells. Orbits appear symmetric bilaterally. Other: Mastoid air cells are clear. IMPRESSION: Atrophy with stable supratentorial small vessel disease. Prior small lacunar infarct right cerebellum. No acute infarct. No mass or hemorrhage. Foci of arterial vascular calcification noted. Foci of paranasal sinus disease. 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 Electronically Signed   By: Lowella Grip III M.D.   On: 02/11/2020 13:46    Procedures Procedures (including critical care time)  Medications Ordered in ED Medications - No data to display  ED Course  I have reviewed the triage vital signs and the nursing notes.  Pertinent labs & imaging results that were available during my care of the patient were reviewed by me and considered in my medical decision making (see chart for details).    MDM Rules/Calculators/A&P                         Patient with increased confusion and multiple strokes on CT and possible  sinus infection.  Labs unremarkable.  Which include CBC chemistries ammonia level and urinalysis patient will be placed on amoxicillin and will follow up with her PCP for further evaluation of this chronic mentation problem Final Clinical Impression(s) / ED Diagnoses Final diagnoses:  Confusion  Acute maxillary sinusitis, recurrence not specified    Rx / DC Orders ED Discharge Orders         Ordered    amoxicillin (AMOXIL) 500 MG capsule  3 times daily        02/11/20 1553           Milton Ferguson, MD 02/17/20 [phone]

## 2020-02-11 NOTE — ED Notes (Signed)
LATE ENTRY FOR 1305:  This RN called to bedside by phlebotomy due to patient stating that blood work was already obtained. On arrival to bedside, patient was reoriented to IV placement, but that lab work was not able to be obtained from IV site. Pt educated and reoriented to the need for labs at this time. Pt was in agreement of phlebotomist to attempt. Labs weren't able to be obtained, and this RN educated on attempted a second time. Pt repeats that "I don't like to be stuck. It hurts." This RN attempted to redirect and reorient, but pt continues to state "It hurts to be poked."  This RN was able to obtain labs with use of PainEase and distraction. Site dressed with 2x2 gauze and tape and provided a warm blanket per request at this time.

## 2020-02-16 ENCOUNTER — Ambulatory Visit (INDEPENDENT_AMBULATORY_CARE_PROVIDER_SITE_OTHER): Payer: Medicare Other | Admitting: *Deleted

## 2020-02-16 DIAGNOSIS — Z5181 Encounter for therapeutic drug level monitoring: Secondary | ICD-10-CM | POA: Diagnosis not present

## 2020-02-16 DIAGNOSIS — Z952 Presence of prosthetic heart valve: Secondary | ICD-10-CM | POA: Diagnosis not present

## 2020-02-16 DIAGNOSIS — I639 Cerebral infarction, unspecified: Secondary | ICD-10-CM | POA: Diagnosis not present

## 2020-02-16 LAB — POCT INR: INR: 2.7 (ref 2.0–3.0)

## 2020-02-16 NOTE — Patient Instructions (Signed)
Continue warfarin 1 tablet daily Recheck in 1 week Order given to [employer] RN [facility] [phone]

## 2020-02-18 ENCOUNTER — Emergency Department (HOSPITAL_COMMUNITY): Payer: Medicare Other

## 2020-02-18 ENCOUNTER — Emergency Department (HOSPITAL_COMMUNITY)
Admission: EM | Admit: 2020-02-18 | Discharge: 2020-03-04 | Disposition: A | Discharge status: Home / Self Care | Payer: Medicare Other | Attending: Emergency Medicine | Admitting: Emergency Medicine

## 2020-02-18 ENCOUNTER — Encounter (HOSPITAL_COMMUNITY): Payer: Self-pay | Admitting: *Deleted

## 2020-02-18 ENCOUNTER — Other Ambulatory Visit: Payer: Self-pay

## 2020-02-18 DIAGNOSIS — Z79899 Other long term (current) drug therapy: Secondary | ICD-10-CM | POA: Insufficient documentation

## 2020-02-18 DIAGNOSIS — R410 Disorientation, unspecified: Secondary | ICD-10-CM | POA: Diagnosis not present

## 2020-02-18 DIAGNOSIS — Z046 Encounter for general psychiatric examination, requested by authority: Secondary | ICD-10-CM | POA: Diagnosis present

## 2020-02-18 DIAGNOSIS — Z7901 Long term (current) use of anticoagulants: Secondary | ICD-10-CM | POA: Diagnosis not present

## 2020-02-18 DIAGNOSIS — I5043 Acute on chronic combined systolic (congestive) and diastolic (congestive) heart failure: Secondary | ICD-10-CM | POA: Diagnosis not present

## 2020-02-18 DIAGNOSIS — R456 Violent behavior: Secondary | ICD-10-CM | POA: Diagnosis not present

## 2020-02-18 DIAGNOSIS — R451 Restlessness and agitation: Secondary | ICD-10-CM | POA: Diagnosis not present

## 2020-02-18 DIAGNOSIS — Z20822 Contact with and (suspected) exposure to covid-19: Secondary | ICD-10-CM | POA: Diagnosis not present

## 2020-02-18 DIAGNOSIS — N183 Chronic kidney disease, stage 3 unspecified: Secondary | ICD-10-CM | POA: Insufficient documentation

## 2020-02-18 DIAGNOSIS — R600 Localized edema: Secondary | ICD-10-CM

## 2020-02-18 DIAGNOSIS — I130 Hypertensive heart and chronic kidney disease with heart failure and stage 1 through stage 4 chronic kidney disease, or unspecified chronic kidney disease: Secondary | ICD-10-CM | POA: Insufficient documentation

## 2020-02-18 DIAGNOSIS — R4689 Other symptoms and signs involving appearance and behavior: Secondary | ICD-10-CM

## 2020-02-18 DIAGNOSIS — E1122 Type 2 diabetes mellitus with diabetic chronic kidney disease: Secondary | ICD-10-CM | POA: Diagnosis not present

## 2020-02-18 DIAGNOSIS — R609 Edema, unspecified: Secondary | ICD-10-CM | POA: Insufficient documentation

## 2020-02-18 DIAGNOSIS — R791 Abnormal coagulation profile: Secondary | ICD-10-CM | POA: Insufficient documentation

## 2020-02-18 LAB — CBC WITH DIFFERENTIAL/PLATELET
Abs Immature Granulocytes: 0.03 10*3/uL (ref 0.00–0.07)
Basophils Absolute: 0 10*3/uL (ref 0.0–0.1)
Basophils Relative: 1 %
Eosinophils Absolute: 0.3 10*3/uL (ref 0.0–0.5)
Eosinophils Relative: 5 %
HCT: 33.8 % — ABNORMAL LOW (ref 36.0–46.0)
Hemoglobin: 11 g/dL — ABNORMAL LOW (ref 12.0–15.0)
Immature Granulocytes: 1 %
Lymphocytes Relative: 16 %
Lymphs Abs: 0.9 10*3/uL (ref 0.7–4.0)
MCH: 37.7 pg — ABNORMAL HIGH (ref 26.0–34.0)
MCHC: 32.5 g/dL (ref 30.0–36.0)
MCV: 115.8 fL — ABNORMAL HIGH (ref 80.0–100.0)
Monocytes Absolute: 0.4 10*3/uL (ref 0.1–1.0)
Monocytes Relative: 8 %
Neutro Abs: 3.7 10*3/uL (ref 1.7–7.7)
Neutrophils Relative %: 69 %
Platelets: 220 10*3/uL (ref 150–400)
RBC: 2.92 MIL/uL — ABNORMAL LOW (ref 3.87–5.11)
RDW: 15.8 % — ABNORMAL HIGH (ref 11.5–15.5)
WBC: 5.3 10*3/uL (ref 4.0–10.5)
nRBC: 0 % (ref 0.0–0.2)

## 2020-02-18 LAB — SALICYLATE LEVEL: Salicylate Lvl: <7 mg/dL — ABNORMAL LOW (ref 7.0–30.0)

## 2020-02-18 LAB — COMPREHENSIVE METABOLIC PANEL
ALT: 13 U/L (ref 0–44)
AST: 25 U/L (ref 15–41)
Albumin: 3.4 g/dL — ABNORMAL LOW (ref 3.5–5.0)
Alkaline Phosphatase: 85 U/L (ref 38–126)
Anion gap: 8 (ref 5–15)
BUN: 22 mg/dL (ref 8–23)
CO2: 21 mmol/L — ABNORMAL LOW (ref 22–32)
Calcium: 9.4 mg/dL (ref 8.9–10.3)
Chloride: 105 mmol/L (ref 98–111)
Creatinine, Ser: 1.26 mg/dL — ABNORMAL HIGH (ref 0.44–1.00)
GFR, Estimated: 43 mL/min — ABNORMAL LOW (ref >60)
Glucose, Bld: 135 mg/dL — ABNORMAL HIGH (ref 70–99)
Potassium: 3.8 mmol/L (ref 3.5–5.1)
Sodium: 134 mmol/L — ABNORMAL LOW (ref 135–145)
Total Bilirubin: 1.5 mg/dL — ABNORMAL HIGH (ref 0.3–1.2)
Total Protein: 8.2 g/dL — ABNORMAL HIGH (ref 6.5–8.1)

## 2020-02-18 LAB — RESP PANEL BY RT-PCR (FLU A&B, COVID) ARPGX2
Influenza A by PCR: NEGATIVE
Influenza B by PCR: NEGATIVE
SARS Coronavirus 2 by RT PCR: NEGATIVE

## 2020-02-18 LAB — PROTIME-INR
INR: 1.7 — ABNORMAL HIGH (ref 0.8–1.2)
Prothrombin Time: 19.6 seconds — ABNORMAL HIGH (ref 11.4–15.2)

## 2020-02-18 LAB — ETHANOL: Alcohol, Ethyl (B): <10 mg/dL (ref <10)

## 2020-02-18 LAB — LIPASE, BLOOD: Lipase: 32 U/L (ref 11–51)

## 2020-02-18 LAB — ACETAMINOPHEN LEVEL: Acetaminophen (Tylenol), Serum: <10 ug/mL — ABNORMAL LOW (ref 10–30)

## 2020-02-18 MED ORDER — AMOXICILLIN 250 MG PO CAPS
500.0000 mg | ORAL_CAPSULE | Freq: Three times a day (TID) | ORAL | Status: AC
  Administered 2020-02-18 – 2020-02-21 (×9): 500 mg via ORAL
  Filled 2020-02-18 (×9): qty 2

## 2020-02-18 MED ORDER — TORSEMIDE 20 MG PO TABS
20.0000 mg | ORAL_TABLET | Freq: Every day | ORAL | Status: DC
  Administered 2020-02-18 – 2020-03-03 (×10): 20 mg via ORAL
  Filled 2020-02-18 (×16): qty 1

## 2020-02-18 MED ORDER — SPIRONOLACTONE 25 MG PO TABS
100.0000 mg | ORAL_TABLET | Freq: Every day | ORAL | Status: DC
  Administered 2020-02-18 – 2020-03-03 (×12): 100 mg via ORAL
  Filled 2020-02-18 (×16): qty 4

## 2020-02-18 MED ORDER — LORAZEPAM 2 MG/ML IJ SOLN
1.0000 mg | Freq: Once | INTRAMUSCULAR | Status: DC
  Filled 2020-02-18: qty 1

## 2020-02-18 MED ORDER — BUPROPION HCL ER (SR) 150 MG PO TB12
150.0000 mg | ORAL_TABLET | Freq: Every day | ORAL | Status: DC
  Administered 2020-02-18 – 2020-03-03 (×12): 150 mg via ORAL
  Filled 2020-02-18 (×18): qty 1

## 2020-02-18 MED ORDER — ALPRAZOLAM 0.5 MG PO TABS
0.5000 mg | ORAL_TABLET | Freq: Three times a day (TID) | ORAL | Status: DC | PRN
  Administered 2020-02-18 – 2020-03-01 (×17): 0.5 mg via ORAL
  Filled 2020-02-18 (×18): qty 1

## 2020-02-18 MED ORDER — HALOPERIDOL LACTATE 5 MG/ML IJ SOLN
2.0000 mg | Freq: Once | INTRAMUSCULAR | Status: AC
  Administered 2020-02-18: 2 mg via INTRAMUSCULAR
  Filled 2020-02-18: qty 1

## 2020-02-18 MED ORDER — MEGESTROL ACETATE 400 MG/10ML PO SUSP
625.0000 mg | Freq: Every day | ORAL | Status: DC
  Administered 2020-02-19 – 2020-03-03 (×9): 625 mg via ORAL
  Filled 2020-02-18 (×16): qty 20

## 2020-02-18 MED ORDER — WARFARIN SODIUM 2.5 MG PO TABS: 2.5000 mg | ORAL_TABLET | Freq: Every day | ORAL | Status: DC

## 2020-02-18 MED ORDER — ACETAMINOPHEN 500 MG PO TABS
500.0000 mg | ORAL_TABLET | Freq: Two times a day (BID) | ORAL | Status: DC | PRN
  Administered 2020-02-18 – 2020-03-03 (×10): 500 mg via ORAL
  Filled 2020-02-18 (×10): qty 1

## 2020-02-18 MED ORDER — ENOXAPARIN SODIUM 60 MG/0.6ML ~~LOC~~ SOLN
50.0000 mg | SUBCUTANEOUS | Status: DC
  Administered 2020-02-18 – 2020-02-19 (×2): 50 mg via SUBCUTANEOUS
  Filled 2020-02-18 (×2): qty 0.6

## 2020-02-18 MED ORDER — WARFARIN - PHARMACIST DOSING INPATIENT: Freq: Every day | Status: DC

## 2020-02-18 MED ORDER — WARFARIN - PHYSICIAN DOSING INPATIENT: Freq: Every day | Status: DC

## 2020-02-18 MED ORDER — WARFARIN SODIUM 5 MG PO TABS
5.0000 mg | ORAL_TABLET | Freq: Once | ORAL | Status: AC
  Administered 2020-02-18: 5 mg via ORAL
  Filled 2020-02-18: qty 1

## 2020-02-18 NOTE — ED Notes (Signed)
Pt served IVC paperwork
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
## 2020-02-18 NOTE — ED Notes (Signed)
Pt calming down. Agreeable to allowing testing to be done due to not wanting injection of ativan
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
## 2020-02-18 NOTE — ED Notes (Signed)
Pt is currently screaming help continually

## 2020-02-18 NOTE — ED Notes (Signed)
Lab unable to obtain blood  
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
## 2020-02-18 NOTE — Progress Notes (Addendum)
ANTICOAGULATION CONSULT NOTE - Initial Consult  Pharmacy Consult for coumadin/Lovenox Indication: AVR/CVA  Allergies  Allergen Reactions  . Tape Itching    Redness, Please use "paper" tape only    Patient Measurements: Height: 5\' 8"  (172.7 cm) Weight: 52.1 kg (114 lb 13.8 oz) IBW/kg (Calculated) : 63.9 Heparin Dosing Weight:   Vital Signs: Temp: 97.7 F (36.5 C) (12/01 1325) Temp Source: Oral (12/01 1325) BP: 145/65 (12/01 1325) Pulse Rate: 104 (12/01 1325)  Labs: Recent Labs    02/16/20 0000 02/18/20 1830  HGB  --  11.0*  HCT  --  33.8*  PLT  --  220  LABPROT  --  19.6*  INR 2.7 1.7*  CREATININE  --  1.26*    Estimated Creatinine Clearance: 28.8 mL/min (A) (by C-G formula based on SCr of 1.26 mg/dL (H)).   Medical History: Past Medical History:  Diagnosis Date  . Anxiety   . Diabetes mellitus with neuropathy ([place])   . Diabetic neuropathy ([place])   . DJD (degenerative joint disease)   . H/O aortic valve replacement   . Hyperlipidemia   . Hypertension   . Neuromuscular disorder ([facility])    neuropathy in feet  . Obesity   . Sleep apnea   . Stroke [facility])    " light stroke"  . Wears glasses     Medications:  (Not in a hospital admission)  Scheduled:  . amoxicillin  500 mg Oral TID  . buPROPion  150 mg Oral Daily  . LORazepam  1 mg Intramuscular Once  . megestrol  625 mg Oral Daily  . spironolactone  100 mg Oral Daily  . torsemide  20 mg Oral Daily   Infusions:    Assessment: Pt has been on coumadin for her AVR and CVA. She was seen in the ED today agitation and confusion. Her INR today is 1.7. Her previous INR was 2.7 on 11/29. We will give her a higher dose today. D/w Dr. Sedonia Small, we will bridge her with some Lovenox until INR is therapeutic since she has a valve.   CrCl<30 ml/min Hgb 11, plt wnl  Goal of Therapy:  INR 2.5-3.5 Monitor platelets by anticoagulation protocol: Yes   Plan:  Coumadin 5mg  PO x1 Lovenox 50mg  SQ q24 Daily INR  Onnie Boer, PharmD, [place], AAHIVP, CPP Infectious Disease Pharmacist 02/18/2020 8:53 PM
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
## 2020-02-18 NOTE — ED Notes (Signed)
Pt yelling help in the hallway. Pt instructed to lay back and not to yell. Pt states "I need to get out of here and go home." Pt redirected at this time.

## 2020-02-19 LAB — URINALYSIS, ROUTINE W REFLEX MICROSCOPIC
Bilirubin Urine: NEGATIVE
Glucose, UA: NEGATIVE mg/dL
Hgb urine dipstick: NEGATIVE
Ketones, ur: NEGATIVE mg/dL
Leukocytes,Ua: NEGATIVE
Nitrite: NEGATIVE
Protein, ur: NEGATIVE mg/dL
Specific Gravity, Urine: 1.008 (ref 1.005–1.030)
pH: 6 (ref 5.0–8.0)

## 2020-02-19 LAB — PROTIME-INR
INR: 1.8 — ABNORMAL HIGH (ref 0.8–1.2)
Prothrombin Time: 20.2 seconds — ABNORMAL HIGH (ref 11.4–15.2)

## 2020-02-19 LAB — RAPID URINE DRUG SCREEN, HOSP PERFORMED
Amphetamines: NOT DETECTED
Barbiturates: NOT DETECTED
Benzodiazepines: NOT DETECTED
Cocaine: NOT DETECTED
Opiates: POSITIVE — AB
Tetrahydrocannabinol: NOT DETECTED

## 2020-02-19 MED ORDER — WARFARIN SODIUM 2.5 MG PO TABS
2.5000 mg | ORAL_TABLET | Freq: Once | ORAL | Status: AC
  Administered 2020-02-19: 2.5 mg via ORAL
  Filled 2020-02-19: qty 1
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
## 2020-02-19 NOTE — ED Notes (Signed)
Pt cleaned and changed into hospital gown. New linens, brief, purewick and blankets provided. Pt repositioned in bed for comfort and denied any other needs at this time. Will continue to monitor.

## 2020-02-19 NOTE — ED Notes (Signed)
Patient Husband Lyndsey Demos visiting at bedside

## 2020-02-19 NOTE — ED Notes (Signed)
Message sent to pharmacy about missing medication

## 2020-02-19 NOTE — Progress Notes (Signed)
ANTICOAGULATION CONSULT NOTE -   Pharmacy Consult for coumadin/Lovenox Indication: AVR/CVA  Allergies  Allergen Reactions  . Tape Itching    Redness, Please use "paper" tape only    Patient Measurements: Height: 5\' 8"  (172.7 cm) Weight: 52.1 kg (114 lb 13.8 oz) IBW/kg (Calculated) : 63.9 Heparin Dosing Weight:   Vital Signs: Temp: 97.8 F (36.6 C) (12/02 0841) Temp Source: Oral (12/02 0841) BP: 122/62 (12/02 0841) Pulse Rate: 86 (12/02 0841)  Labs: Recent Labs    02/18/20 1830 02/19/20 0619  HGB 11.0*  --   HCT 33.8*  --   PLT 220  --   LABPROT 19.6* 20.2*  INR 1.7* 1.8*  CREATININE 1.26*  --     Estimated Creatinine Clearance: 28.8 mL/min (A) (by C-G formula based on SCr of 1.26 mg/dL (H)).   Medical History: Past Medical History:  Diagnosis Date  . Anxiety   . Diabetes mellitus with neuropathy ([place])   . Diabetic neuropathy ([place])   . DJD (degenerative joint disease)   . H/O aortic valve replacement   . Hyperlipidemia   . Hypertension   . Neuromuscular disorder (HCC)    neuropathy in feet  . Obesity   . Sleep apnea   . Stroke [facility])    " light stroke"  . Wears glasses     Medications:  (Not in a hospital admission)  Scheduled:  . amoxicillin  500 mg Oral TID  . buPROPion  150 mg Oral Daily  . enoxaparin (LOVENOX) injection  50 mg Subcutaneous Q24H  . LORazepam  1 mg Intramuscular Once  . megestrol  625 mg Oral Daily  . spironolactone  100 mg Oral Daily  . torsemide  20 mg Oral Daily  . Warfarin - Pharmacist Dosing Inpatient   Does not apply q1600   Infusions:    Assessment: Pt has been on coumadin for her AVR and CVA. She was seen in the ED today agitation and confusion. Her INR on admissoin is 1.7.  D/w Dr. Sedonia Small, we will bridge her with some Lovenox until INR is therapeutic since she has a valve.   CrCl<30 ml/min Hgb 11, plt wnl  Goal of Therapy:  INR 2.5-3.5 Monitor platelets by anticoagulation protocol: Yes   Plan:  Coumadin  2.5mg  PO x1 Lovenox 50mg  SQ q24 Daily INR  Margot Ables, PharmD Clinical Pharmacist 02/19/2020 11:01 AM
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
## 2020-02-19 NOTE — Progress Notes (Signed)
Pt meets inpatient criteria per Lindon Romp, NP. Referral information has been sent to the following hospitals for review:  [facility]  [facility]     Disposition will continue to assist with inpatient placement needs.    Audree Camel, MSW, LCSW, [place] Clinical Social Worker II Disposition CSW [phone]
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
## 2020-02-19 NOTE — ED Notes (Signed)
Pt cleaned. Brief changed. New brief applied. Purewick readjusted. Pt denied any other needs at this time and appears to be in NAD. Will continue to monitor.

## 2020-02-20 LAB — PROTIME-INR
INR: 2.1 — ABNORMAL HIGH (ref 0.8–1.2)
Prothrombin Time: 23.1 seconds — ABNORMAL HIGH (ref 11.4–15.2)

## 2020-02-20 MED ORDER — WARFARIN SODIUM 5 MG PO TABS
5.0000 mg | ORAL_TABLET | Freq: Once | ORAL | Status: AC
  Administered 2020-02-20: 5 mg via ORAL
  Filled 2020-02-20: qty 1

## 2020-02-20 MED ORDER — WARFARIN SODIUM 2.5 MG PO TABS
2.5000 mg | ORAL_TABLET | Freq: Once | ORAL | Status: DC
  Filled 2020-02-20: qty 1

## 2020-02-20 NOTE — Progress Notes (Addendum)
ANTICOAGULATION CONSULT NOTE -   Pharmacy Consult for coumadin/Lovenox Indication: AVR/CVA  Allergies  Allergen Reactions  . Tape Itching    Redness, Please use "paper" tape only    Patient Measurements: Height: 5\' 8"  (172.7 cm) Weight: 52.1 kg (114 lb 13.8 oz) IBW/kg (Calculated) : 63.9 Heparin Dosing Weight:   Vital Signs: Temp: 98.2 F (36.8 C) (12/03 0600) Temp Source: Oral (12/03 0600) BP: 114/75 (12/03 0600) Pulse Rate: 74 (12/03 0600)  Labs: Recent Labs    02/18/20 1830 02/19/20 0619 02/20/20 0313  HGB 11.0*  --   --   HCT 33.8*  --   --   PLT 220  --   --   LABPROT 19.6* 20.2* 23.1*  INR 1.7* 1.8* 2.1*  CREATININE 1.26*  --   --     Estimated Creatinine Clearance: 28.8 mL/min (A) (by C-G formula based on SCr of 1.26 mg/dL (H)).  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 She was seen in the ED today agitation and confusion. Her INR on admissoin is 1.7.  D/w Dr. Sedonia Small, we will bridge her with some Lovenox until INR is therapeutic since she has a valve.   Home dose: warfarin 2.mg daily    Goal of Therapy:  INR 2.5-3.5 Monitor platelets by anticoagulation protocol: Yes   02/20/20 1000 update INR: 2.1-->still below therapeutic goal range of 2.5-3.5 CBC:  Ordered RN reports no issues with bleeding at this time   Plan:  Give warfarin  5mg  po x1 dose tonight Continue Lovenox 50mg  SQ q24h until  INR is within therapeutic range Daily INR  Despina Pole, Pharm. D. Clinical Pharmacist 02/20/2020 10:21 AM

## 2020-02-20 NOTE — ED Notes (Signed)
Report given to oncoming nurse.

## 2020-02-20 NOTE — Progress Notes (Addendum)
Voice message was left for Jeanett Schlein [phone]) in admissions at [facility] requesting a return phone call to discuss their decision about pt's referral.   Audree Camel, MSW, LCSW, LCAS Clinical Social Worker II Disposition CSW [phone]   UPDATE: CSW spoke with Jeanett Schlein at [facility]. She was declined acceptance due to being medically unstable (skin tears, incontinence, unable to perform most ADLs)  Disposition will continue to follow.

## 2020-02-20 NOTE — ED Notes (Signed)
Patient screaming from her room, attempting to get out of bed. This nurse assisted pt out of bed to go for a walk with full assistance. PO water given and prn meds given. Safety sitter remains at bedside. Pt wanted to sit in chair. Assisted pt in doing this.

## 2020-02-20 NOTE — ED Notes (Signed)
Pt ambulated with this nurse to toilet where she voided. Able to walk with assistance. Sat in chair in room and fresh ice water given. Safety attendant at bedside.

## 2020-02-20 NOTE — ED Notes (Signed)
Patient had episode of urinary and stool incontinence.  Assisted with pericare and new brief applied.  Patient positioned for comfort.  Call bell in reach.

## 2020-02-21 DIAGNOSIS — R410 Disorientation, unspecified: Secondary | ICD-10-CM | POA: Diagnosis present

## 2020-02-21 DIAGNOSIS — R451 Restlessness and agitation: Secondary | ICD-10-CM | POA: Diagnosis present

## 2020-02-21 LAB — PROTIME-INR
INR: 2 — ABNORMAL HIGH (ref 0.8–1.2)
Prothrombin Time: 22.3 seconds — ABNORMAL HIGH (ref 11.4–15.2)

## 2020-02-21 MED ORDER — WARFARIN SODIUM 5 MG PO TABS
5.0000 mg | ORAL_TABLET | Freq: Once | ORAL | Status: AC
  Administered 2020-02-21: 5 mg via ORAL
  Filled 2020-02-21: qty 1

## 2020-02-21 NOTE — ED Notes (Signed)
Husband at pts bedside.  Husband continues to ask questions regarding her care and expectations.

## 2020-02-21 NOTE — Progress Notes (Addendum)
ANTICOAGULATION CONSULT NOTE -   Pharmacy Consult for coumadin/Lovenox Indication: AVR/CVA  Allergies  Allergen Reactions  . Tape Itching    Redness, Please use "paper" tape only    Patient Measurements: Height: 5\' 8"  (172.7 cm) Weight: 52.1 kg (114 lb 13.8 oz) IBW/kg (Calculated) : 63.9 Heparin Dosing Weight:   Vital Signs: BP: 154/70 (12/04 1303) Pulse Rate: 90 (12/04 1303)  Labs: Recent Labs    02/18/20 1830 02/18/20 1830 02/19/20 0619 02/20/20 0313 02/21/20 1053  HGB 11.0*  --   --   --   --   HCT 33.8*  --   --   --   --   PLT 220  --   --   --   --   LABPROT 19.6*   < > 20.2* 23.1* 22.3*  INR 1.7*   < > 1.8* 2.1* 2.0*  CREATININE 1.26*  --   --   --   --    < > = values in this interval not displayed.    Estimated Creatinine Clearance: 28.8 mL/min (A) (by C-G formula based on SCr of 1.26 mg/dL (H)).  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 She was seen in the ED today agitation and confusion. Her INR on admissoin is 1.7.  D/w Dr. Sedonia Small, we will bridge her with some Lovenox until INR is therapeutic since she has a valve.   Home dose: warfarin 2.5 mg daily    Goal of Therapy:  INR 2.5-3.5 Monitor platelets by anticoagulation protocol: Yes   02/21/20 1400 update INR: 2.0-->still below therapeutic goal range of 2.5-3.5 CBC:  M-W-F RN reports no issues with bleeding at this time   Plan:  Repeat  warfarin  5mg  po x1 dose tonight-->expect to see INR move up in  next day or two Continue Lovenox 50mg  SQ q24h until  INR is within therapeutic range Daily INR  Despina Pole, Pharm. D. Clinical Pharmacist 02/21/2020 1:55 PM

## 2020-02-21 NOTE — ED Notes (Signed)
Pt sleeping. 

## 2020-02-21 NOTE — ED Notes (Signed)
INR drawn.

## 2020-02-21 NOTE — ED Notes (Signed)
Pt finally asleep; vs deferred

## 2020-02-21 NOTE — ED Notes (Signed)
Pt given lunch tray.
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
## 2020-02-22 LAB — PROTIME-INR
INR: 2.6 — ABNORMAL HIGH (ref 0.8–1.2)
Prothrombin Time: 26.6 seconds — ABNORMAL HIGH (ref 11.4–15.2)

## 2020-02-22 MED ORDER — WARFARIN SODIUM 2.5 MG PO TABS
2.5000 mg | ORAL_TABLET | Freq: Once | ORAL | Status: AC
  Administered 2020-02-22: 2.5 mg via ORAL
  Filled 2020-02-22: qty 1

## 2020-02-22 NOTE — Progress Notes (Signed)
ANTICOAGULATION CONSULT NOTE -   Pharmacy Consult for coumadin/Lovenox Indication: AVR/CVA  Allergies  Allergen Reactions  . Tape Itching    Redness, Please use "paper" tape only    Patient Measurements: Height: 5\' 8"  (172.7 cm) Weight: 52.1 kg (114 lb 13.8 oz) IBW/kg (Calculated) : 63.9 Heparin Dosing Weight:   Vital Signs: Temp: 98.2 F (36.8 C) (12/05 0614) Temp Source: Oral (12/05 0614) BP: 134/65 (12/05 0614) Pulse Rate: 95 (12/05 0614)  Labs: Recent Labs    02/20/20 0313 02/21/20 1053 02/22/20 0841  LABPROT 23.1* 22.3* 26.6*  INR 2.1* 2.0* 2.6*    Estimated Creatinine Clearance: 28.8 mL/min (A) (by C-G formula based on SCr of 1.26 mg/dL (H)).  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 She was seen in the ED today agitation and confusion. Her INR on admissoin is 1.7.  D/w Dr. Sedonia Small, we will bridge her with some Lovenox until INR is therapeutic since she has a valve.   Home dose: warfarin 2.5 mg daily   Goal of Therapy:  INR 2.5-3.5 Monitor platelets by anticoagulation protocol: Yes   02/22/20 1130  update INR: 2.6-->now within  therapeutic goal range of 2.5-3.5 CBC:  M-W-F RN reports no issues with bleeding at this time   Plan:  Give   warfarin  2.5mg  po x1 dose tonight-->home dose Discontinue Lovenox 50mg  SQ q24h since  INR is within therapeutic range Daily INR and every other day CBC  Despina Pole, Pharm. D. Clinical Pharmacist 02/22/2020 11:38 AM

## 2020-02-22 NOTE — ED Notes (Signed)
Pt agitated during previous shift and was medicated for this. Pt now sleeping. Vital signs deferred at this time.

## 2020-02-22 NOTE — ED Notes (Signed)
Pt becoming increasingly agitated with staff and verbally aggressive. Pt continues to states "I want to go home I dont need to be here." Pt redirected and PRN medication given. Pt remains 1-1 observation with sitter in doorway.
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
## 2020-02-23 LAB — PROTIME-INR
INR: 2.6 — ABNORMAL HIGH (ref 0.8–1.2)
Prothrombin Time: 27.2 seconds — ABNORMAL HIGH (ref 11.4–15.2)

## 2020-02-23 MED ORDER — WARFARIN SODIUM 2.5 MG PO TABS
2.5000 mg | ORAL_TABLET | Freq: Once | ORAL | Status: AC
  Administered 2020-02-23: 2.5 mg via ORAL
  Filled 2020-02-23: qty 1

## 2020-02-23 MED ORDER — LORATADINE 10 MG PO TABS
10.0000 mg | ORAL_TABLET | Freq: Every day | ORAL | Status: DC
  Administered 2020-02-23 – 2020-03-03 (×8): 10 mg via ORAL
  Filled 2020-02-23 (×10): qty 1

## 2020-02-23 MED ORDER — LACTULOSE 10 GM/15ML PO SOLN
20.0000 g | Freq: Every day | ORAL | Status: DC
  Administered 2020-02-23 – 2020-03-03 (×5): 20 g via ORAL
  Filled 2020-02-23 (×11): qty 30

## 2020-02-23 MED ORDER — BUSPIRONE HCL 5 MG PO TABS
5.0000 mg | ORAL_TABLET | Freq: Two times a day (BID) | ORAL | Status: DC
  Administered 2020-02-23 – 2020-03-03 (×19): 5 mg via ORAL
  Filled 2020-02-23 (×20): qty 1

## 2020-02-23 MED ORDER — PANTOPRAZOLE SODIUM 40 MG PO TBEC
40.0000 mg | DELAYED_RELEASE_TABLET | Freq: Every day | ORAL | Status: DC
  Administered 2020-02-23 – 2020-03-03 (×9): 40 mg via ORAL
  Filled 2020-02-23 (×10): qty 1

## 2020-02-23 MED ORDER — BISACODYL 5 MG PO TBEC: 5.0000 mg | DELAYED_RELEASE_TABLET | Freq: Every day | ORAL | Status: DC | PRN

## 2020-02-23 NOTE — Progress Notes (Signed)
ANTICOAGULATION CONSULT NOTE -   Pharmacy Consult for coumadin Indication: AVR/CVA  Allergies  Allergen Reactions  . Tape Itching    Redness, Please use "paper" tape only    Patient Measurements: Height: 5\' 8"  (172.7 cm) Weight: 52.1 kg (114 lb 13.8 oz) IBW/kg (Calculated) : 63.9 Heparin Dosing Weight:   Vital Signs: Temp: 98.5 F (36.9 C) (12/06 1251) Temp Source: Oral (12/06 1251) BP: 138/68 (12/06 1251) Pulse Rate: 112 (12/06 1251)  Labs: Recent Labs    02/21/20 1053 02/22/20 0841  LABPROT 22.3* 26.6*  INR 2.0* 2.6*    Estimated Creatinine Clearance: 28.8 mL/min (A) (by C-G formula based on SCr of 1.26 mg/dL (H)).  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 She was seen in the ED today agitation and confusion. Her INR on admissoin is 1.7.  D/w Dr. Sedonia Small, we will bridge her with some Lovenox until INR is therapeutic since she has a valve.   Home dose: warfarin 2.5 mg daily   Goal of Therapy:  INR 2.5-3.5 Monitor platelets by anticoagulation protocol: Yes    INR: 2.6-->now within  therapeutic goal range of 2.5-3.5 CBC:  M-W-F RN reports no issues with bleeding at this time   Plan:  Give warfarin  2.5mg  po x1 dose tonight Daily INR and every other day CBC  Margot Ables, PharmD Clinical Pharmacist 02/23/2020 3:00 PM

## 2020-02-23 NOTE — ED Notes (Signed)
Pt refused am labs per Phlebotomy.

## 2020-02-23 NOTE — BH Assessment (Signed)
Reassessment 02/23/20  Mary [name] is a 79 year old female, at [facility] under IVC. Patient has been in the ED for six days awaiting placement for [facility] psych treatment. Patient identifies her name and date of birth without hesitation. Patient reports ED visit due to "I was depressed". Patient reports "I feel fine now and I'm ready to go home." Patient denies current depressive symptoms. Patient reports that her husband continues to support her and has visited her recently. When asked about her husband thoughts on treatment patient states "he just wants me to get better" patient then reiterates that she feels good and ready to go home. Patient denies history of mental health counseling and again repeats "I don't want that I just want to go home". Patient reminded of current disposition. Clinician provides time for patient to ask questions, but patient does not have any questions.  Patient oriented to person, place and situation, she is engaged, alert and cooperative during assessment. Patient is pleasant, her eye contact and tone of voice is normal. Patient reports sleeping well and having a good appetite. Patient denies SI/HI/AVH.  Disposition: Per Harriett Sine, NP, patient continues to meet criteria for [facility] inpatient treatment.

## 2020-02-23 NOTE — ED Notes (Signed)
husband at bedside asking questions regarding care plan.  This nurse answered questions to the best of my ability.

## 2020-02-23 NOTE — Clinical Social Work Note (Signed)
TOC received consult for SNF from [facility] NP. Catherine spoke with Sarah with [facility] disposition. CSW explained patient's DSS caseworker, [facility], has been working on ALF placement with the patient's husband, but the patient will not be accepted to an ALF with her current behavioral health issues. CSW also explained patient cannot go to SNF if she has a Actuary or chemical restraints are currently being used. Patient to be discussed at [facility] huddle tomorrow in AM. Ms. Kayleen Memos updated.

## 2020-02-24 LAB — PROTIME-INR
INR: 2.9 — ABNORMAL HIGH (ref 0.8–1.2)
Prothrombin Time: 29 seconds — ABNORMAL HIGH (ref 11.4–15.2)

## 2020-02-24 MED ORDER — WARFARIN SODIUM 2.5 MG PO TABS
2.5000 mg | ORAL_TABLET | Freq: Once | ORAL | Status: AC
  Administered 2020-02-24: 2.5 mg via ORAL
  Filled 2020-02-24: qty 1

## 2020-02-24 NOTE — ED Notes (Signed)
Pt c/o bilateral leg pain, pt was given tylenol.

## 2020-02-24 NOTE — Progress Notes (Signed)
Referral information has been re-faxed to the following geriatric psychiatric hospitals for review:  [facility]  [facility]  [facility]     Disposition will continue to follow.    Audree Camel, LCSW, [place] Social Worker II Disposition CSW [phone]

## 2020-02-24 NOTE — ED Notes (Signed)
Pt turned to right side. Redness to sacral area noted. Foam sacral patch placed.

## 2020-02-24 NOTE — Progress Notes (Signed)
ANTICOAGULATION CONSULT NOTE -   Pharmacy Consult for coumadin Indication: AVR/CVA  Allergies  Allergen Reactions  . Tape Itching    Redness, Please use "paper" tape only    Patient Measurements: Height: 5\' 8"  (172.7 cm) Weight: 52.1 kg (114 lb 13.8 oz) IBW/kg (Calculated) : 63.9  Vital Signs: Temp: 98.2 F (36.8 C) (12/07 0955) Temp Source: Oral (12/07 0955) BP: 129/62 (12/07 0955) Pulse Rate: 101 (12/07 0955)  Labs: Recent Labs    02/22/20 0841 02/23/20 1420 02/24/20 0334  LABPROT 26.6* 27.2* 29.0*  INR 2.6* 2.6* 2.9*    Estimated Creatinine Clearance: 28.8 mL/min (A) (by C-G formula based on SCr of 1.26 mg/dL (H)).  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 She was seen in the ED today agitation and confusion. Her INR on admissoin is 1.7.  D/w Dr. Sedonia Small, we will bridge her with some Lovenox until INR is therapeutic since she has a valve.   INR: 2.9- remains therapeutic  Home dose: warfarin 2.5 mg daily   Goal of Therapy:  INR 2.5-3.5 Monitor platelets by anticoagulation protocol: Yes   Plan:  Give warfarin  2.5mg  po x1 dose tonight CBC:  M-W-F Daily INR and every other day CBC  02/24/2020 11:45 AM

## 2020-02-25 LAB — CBC
HCT: 34.9 % — ABNORMAL LOW (ref 36.0–46.0)
Hemoglobin: 11.6 g/dL — ABNORMAL LOW (ref 12.0–15.0)
MCH: 37.7 pg — ABNORMAL HIGH (ref 26.0–34.0)
MCHC: 33.2 g/dL (ref 30.0–36.0)
MCV: 113.3 fL — ABNORMAL HIGH (ref 80.0–100.0)
Platelets: 208 10*3/uL (ref 150–400)
RBC: 3.08 MIL/uL — ABNORMAL LOW (ref 3.87–5.11)
RDW: 15.1 % (ref 11.5–15.5)
WBC: 6.3 10*3/uL (ref 4.0–10.5)
nRBC: 0 % (ref 0.0–0.2)

## 2020-02-25 LAB — PROTIME-INR
INR: 3.2 — ABNORMAL HIGH (ref 0.8–1.2)
Prothrombin Time: 31.8 seconds — ABNORMAL HIGH (ref 11.4–15.2)

## 2020-02-25 MED ORDER — WARFARIN SODIUM 2.5 MG PO TABS
2.5000 mg | ORAL_TABLET | Freq: Once | ORAL | Status: AC
  Administered 2020-02-25: 2.5 mg via ORAL
  Filled 2020-02-25: qty 1

## 2020-02-25 MED ORDER — ZOLPIDEM TARTRATE 5 MG PO TABS
5.0000 mg | ORAL_TABLET | Freq: Once | ORAL | Status: AC
  Administered 2020-02-25: 5 mg via ORAL
  Filled 2020-02-25: qty 1

## 2020-02-25 NOTE — BH Assessment (Signed)
Reassessment Note: Pt presents sitting upright in bed. She states she isn't "feeling too good" today due to all-over physical pain. Pt initially stated she was at [facility] then stated she knew that was wrong. She stated "that's right" when advised she was at [facility]. Pt denies recent decline in memory. She was unable to give president, year or month. She knew Christmas was near. Pt states she has been married 10 years and has 2 grown sons. Pt was calm and cooperative throughout assessment. Inpt tx continues to be recommended.

## 2020-02-25 NOTE — ED Notes (Signed)
Pt getting agitated, asking for something to help her sleep. MD made aware.

## 2020-02-25 NOTE — Progress Notes (Addendum)
ANTICOAGULATION CONSULT NOTE -   Pharmacy Consult for coumadin Indication: AVR/CVA  Allergies  Allergen Reactions  . Tape Itching    Redness, Please use "paper" tape only    Patient Measurements: Height: 5\' 8"  (172.7 cm) Weight: 52.1 kg (114 lb 13.8 oz) IBW/kg (Calculated) : 63.9  Vital Signs: Temp: 98 F (36.7 C) (12/08 0630) Temp Source: Oral (12/08 0630) BP: 111/57 (12/08 0630) Pulse Rate: 97 (12/08 0630)  Labs: Recent Labs    02/23/20 1420 02/24/20 0334 02/25/20 0425  HGB  --   --  11.6*  HCT  --   --  34.9*  PLT  --   --  208  LABPROT 27.2* 29.0* 31.8*  INR 2.6* 2.9* 3.2*    Estimated Creatinine Clearance: 28.8 mL/min (A) (by C-G formula based on SCr of 1.26 mg/dL (H)).  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 She was seen in the ED today agitation and confusion. Her INR on admissoin is 1.7.  D/w Dr. Sedonia Small, we will bridge her with some Lovenox until INR is therapeutic since she has a valve.   INR: 3.2- therapeutic Home dose: warfarin 2.5 mg daily   Goal of Therapy:  INR 2.5-3.5 Monitor platelets by anticoagulation protocol: Yes   Plan:  Warfarin 2.5 mg x 1 dose. CBC:  M-W-F Daily INR and every other day CBC  Margot Ables, PharmD Clinical Pharmacist 02/25/2020 8:41 AM

## 2020-02-26 DIAGNOSIS — R451 Restlessness and agitation: Secondary | ICD-10-CM

## 2020-02-26 LAB — PROTIME-INR
INR: 3.1 — ABNORMAL HIGH (ref 0.8–1.2)
Prothrombin Time: 31.1 s — ABNORMAL HIGH (ref 11.4–15.2)

## 2020-02-26 MED ORDER — WARFARIN SODIUM 2.5 MG PO TABS
2.5000 mg | ORAL_TABLET | Freq: Once | ORAL | Status: AC
  Administered 2020-02-26: 2.5 mg via ORAL
  Filled 2020-02-26: qty 1

## 2020-02-26 MED ORDER — QUETIAPINE FUMARATE 25 MG PO TABS
25.0000 mg | ORAL_TABLET | Freq: Every day | ORAL | Status: DC
  Administered 2020-02-26 – 2020-03-03 (×7): 25 mg via ORAL
  Filled 2020-02-26 (×7): qty 1

## 2020-02-26 NOTE — ED Notes (Addendum)
Behavorial heath called and states the pt is psych cleared. LCSW is working on memory care placement.

## 2020-02-26 NOTE — ED Notes (Signed)
Pt is alert and sitting in chair. Pt talking with sitter. No needs expressed at this time.

## 2020-02-26 NOTE — Progress Notes (Signed)
CSW informed by Harriett Sine, NP that patient has been psyched cleared.  CSW relayed information (epic secure chat) to Iona Beard, LCSW so the process of referring patient to a  memory care facility could be initiated.   [place] Disposition [phone] (cell)

## 2020-02-26 NOTE — ED Notes (Signed)
Dressing to left forearm skin tear changed.

## 2020-02-26 NOTE — ED Notes (Signed)
Patient given water at this time.  

## 2020-02-26 NOTE — Clinical Social Work Note (Addendum)
CSW informed that pt has been psych cleared. CSW informed Harriett Sine, NP and Jola Babinski, LCSW that DSS will be handling memory care placement as pursuing memory care ALF was in process before pt presented to the ED for IVC. TOC will continue to provide updates to DSS case worker.
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
## 2020-02-26 NOTE — Clinical Social Work Note (Signed)
CSW received call from DSS caseworker, [place]. CSW provided update.

## 2020-02-26 NOTE — BHH Counselor (Addendum)
TTS reassessment: Patient is alert and oriented to self and place. She does not know the date but is able to tell me it is December. She denies SI/HI/AVH. She is requesting to go home. It appears SNF placement is being sought. Will discuss with [facility] provider.

## 2020-02-26 NOTE — ED Notes (Addendum)
Will reassess vitals per protocol

## 2020-02-26 NOTE — ED Notes (Signed)
Pt comfortably resting, will reassess vitals at a later time.

## 2020-02-26 NOTE — BH Assessment (Signed)
CSW followed up with TOC SW to inquire about Pt's need for a memory care unit. CSW was made aware that Pt will require a memory care unit but she must be stabilized psychiatrically prior to being placed on memory care unit.   Freddi Che, LCSW Clinical Social Worker [phone]

## 2020-02-26 NOTE — ED Notes (Signed)
Pt refusing to have blood work, she states "I'm going home today so I do not need blood work"

## 2020-02-26 NOTE — Progress Notes (Signed)
ANTICOAGULATION CONSULT NOTE -   Pharmacy Consult for coumadin Indication: AVR/CVA  Allergies  Allergen Reactions  . Tape Itching    Redness, Please use "paper" tape only    Patient Measurements: Height: 5\' 8"  (172.7 cm) Weight: 52.1 kg (114 lb 13.8 oz) IBW/kg (Calculated) : 63.9  Vital Signs: Temp: 97.8 F (36.6 C) (12/09 0623) Temp Source: Oral (12/09 0623) BP: 130/68 (12/09 0623) Pulse Rate: 82 (12/09 0623)  Labs: Recent Labs    02/24/20 0334 02/25/20 0425 02/26/20 0554  HGB  --  11.6*  --   HCT  --  34.9*  --   PLT  --  208  --   LABPROT 29.0* 31.8* 31.1*  INR 2.9* 3.2* 3.1*    Estimated Creatinine Clearance: 28.8 mL/min (A) (by C-G formula based on SCr of 1.26 mg/dL (H)).  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 She was seen in the ED today agitation and confusion. Her INR on admissoin is 1.7.  D/w Dr. Sedonia Small, we will bridge her with some Lovenox until INR is therapeutic since she has a valve.   INR: 3.1- therapeutic Home dose: warfarin 2.5 mg daily   Goal of Therapy:  INR 2.5-3.5 Monitor platelets by anticoagulation protocol: Yes   Plan:  Warfarin 2.5 mg x 1 dose. CBC:  M-W-F Daily INR and every other day CBC  Margot Ables, PharmD Clinical Pharmacist 02/26/2020 11:29 AM

## 2020-02-27 LAB — PROTIME-INR
INR: 3.5 — ABNORMAL HIGH (ref 0.8–1.2)
Prothrombin Time: 34.4 seconds — ABNORMAL HIGH (ref 11.4–15.2)

## 2020-02-27 MED ORDER — TUBERCULIN PPD 5 UNIT/0.1ML ID SOLN
5.0000 [IU] | INTRADERMAL | Status: AC
  Administered 2020-02-27: 5 [IU] via INTRADERMAL
  Filled 2020-02-27: qty 0.1

## 2020-02-27 MED ORDER — WARFARIN SODIUM 2.5 MG PO TABS
2.5000 mg | ORAL_TABLET | Freq: Once | ORAL | Status: AC
  Administered 2020-02-27: 2.5 mg via ORAL
  Filled 2020-02-27 (×2): qty 1

## 2020-02-27 NOTE — Clinical Social Work Note (Addendum)
CSW updated Ms. Kayleen Memos with DSS that patient was psych cleared and her IVC was renewed. Per Ms. Kayleen Memos, she is trying to get the patient into [facility]F in [place]. TOC to follow.  Addendum: Ms. Kayleen Memos called CSW to inform her patient will need a new FL2 and TB skin test for the ALF. FL2 completed and medication list faxed to DSS. CSW confirmed with Ms. Kayleen Memos.

## 2020-02-27 NOTE — ED Notes (Signed)
Pt is restless and yelling "help". Trying to get out of bed. Sitter is watching pt. The doctor has been notified and orders have not been given at this time.

## 2020-02-27 NOTE — NC FL2 (Signed)
[place] MEDICAID FL2 LEVEL OF CARE SCREENING TOOL     IDENTIFICATION  Patient Name: Mary [name] Birthdate: 08-04-1940 Sex: female Admission Date (Current Location): 02/18/2020  [facility] and [place] Number:  [employer] and Address:  [place] [address], [place]      Provider Number: [id number]  Attending Physician Name and Address:  Default, Provider, MD  Relative Name and Phone Number:  Jonquil Stubbe (husband) Ph: [phone]    Current Level of Care: Hospital Recommended Level of Care: Assisted Living [facility] Care Prior Approval Number:    Date Approved/Denied:   PASRR Number: [id number]  Discharge Plan: Other (Comment) (ALF (memory care unit))    Current Diagnoses: Patient Active Problem List   Diagnosis Date Noted  . Agitation 02/21/2020  . Confusion 02/21/2020  . Hyperkalemia 01/15/2020  . 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 Anticoagulant long-term use 01/15/2020  . Other secondary pulmonary hypertension ([place]) 10/28/2019  . Acute on chronic combined systolic and diastolic CHF (congestive heart failure) ([place])   . Anasarca 04/25/2019  . Hypoalbuminemia 04/25/2019  . Persistent atrial fibrillation ([place]) 04/25/2019  . CKD (chronic kidney disease), stage III ([place]) 04/25/2019  . Anemia due to chronic kidney disease 04/25/2019  . Pressure injury of skin 04/24/2019  . Fall 04/23/2019  . S/P right knee arthroscopy 04/13/2015  . OSA (obstructive sleep apnea) 01/01/2013  . Diabetes ([place]) 10/23/2012  . Aortic valve disorder 06/30/2010  . CVA (cerebral vascular accident) ([place]) 06/30/2010  . S/P aortic valve replacement 06/30/2010  . Hyperlipidemia 09/29/2008  . Obesity 09/29/2008  . Essential hypertension 09/29/2008  . DEGENERATIVE JOINT DISEASE 09/29/2008    Orientation RESPIRATION BLADDER Height & Weight     Self,Place  Normal Continent Weight: 114 lb 13.8 oz (52.1 kg) Height:  5\' 8"  (172.7 cm)   BEHAVIORAL SYMPTOMS/MOOD NEUROLOGICAL BOWEL NUTRITION STATUS  Other (Comment) (Due to patient's lack of orientation, she will frequently call out for help and requires redirection.)   Continent Diet (Regular diet)  AMBULATORY STATUS COMMUNICATION OF NEEDS Skin   Extensive Assist Verbally Normal                       Personal Care Assistance Level of Assistance  Bathing,Feeding,Dressing Bathing Assistance: Maximum assistance Feeding assistance: Limited assistance Dressing Assistance: Maximum assistance     Functional Limitations Info  Sight,Hearing,Speech Sight Info: Impaired Hearing Info: Adequate Speech Info: Adequate    SPECIAL CARE FACTORS FREQUENCY                       Contractures Contractures Info: Not present    Additional Factors Info  Code Status,Allergies,Psychotropic Code Status Info: Full Allergies Info: Tape (use paper tape only) Psychotropic Info: Wellbutrin (bupropion); BuSpar (buspirone); Seroquel (quetiapine)         Current Medications (02/27/2020):  This is the current hospital active medication list Current Facility-Administered Medications  Medication Dose Route Frequency Provider Last Rate Last Admin  . acetaminophen (TYLENOL) tablet 500 mg  500 mg Oral BID PRN Caccavale, Sophia, PA-C   500 mg at 02/25/20 2007  . ALPRAZolam Duanne Moron) tablet 0.5 mg  0.5 mg Oral TID PRN Caccavale, Sophia, PA-C   0.5 mg at 02/27/20 0848  . bisacodyl (DULCOLAX) EC tablet 5 mg  5 mg Oral Daily PRN Rolland Porter, MD      . buPROPion [facility] SR) 12 hr tablet 150 mg  150 mg  Oral Daily Caccavale, Sophia, PA-C   150 mg at 02/26/20 1005  . busPIRone (BUSPAR) tablet 5 mg  5 mg Oral BID Rolland Porter, MD   5 mg at 02/27/20 0848  . lactulose (CHRONULAC) 10 GM/15ML solution 20 g  20 g Oral Daily Rolland Porter, MD   20 g at 02/23/20 0912  . loratadine (CLARITIN) tablet 10 mg  10 mg Oral Daily Rolland Porter, MD   10 mg at 02/27/20 0848  . LORazepam (ATIVAN) injection 1 mg  1 mg  Intramuscular Once Caccavale, Sophia, PA-C      . megestrol (MEGACE) 400 MG/10ML suspension 625 mg  625 mg Oral Daily Caccavale, Sophia, PA-C   625 mg at 02/27/20 0847  . pantoprazole (PROTONIX) EC tablet 40 mg  40 mg Oral QAC breakfast Rolland Porter, MD   40 mg at 02/27/20 0848  . QUEtiapine (SEROQUEL) tablet 25 mg  25 mg Oral QHS Connye Burkitt, NP   25 mg at 02/26/20 2136  . spironolactone (ALDACTONE) tablet 100 mg  100 mg Oral Daily Caccavale, Sophia, PA-C   100 mg at 02/26/20 1005  . torsemide (DEMADEX) tablet 20 mg  20 mg Oral Daily Caccavale, Sophia, PA-C   20 mg at 02/24/20 1010  . Warfarin - Pharmacist Dosing Inpatient   Does not apply [place], RPH-CPP   Given at 02/24/20 1620   Current Outpatient Medications  Medication Sig Dispense Refill  . acetaminophen (TYLENOL) 500 MG tablet Take 500 mg by mouth 2 (two) times daily as needed for mild pain or moderate pain.     Marland Kitchen ALPRAZolam (XANAX) 0.5 MG tablet Take 1 tablet (0.5 mg total) by mouth 3 (three) times daily as needed for anxiety. 20 tablet 0  . amoxicillin (AMOXIL) 500 MG capsule Take 1 capsule (500 mg total) by mouth 3 (three) times daily. 30 capsule 0  . bisacodyl (DULCOLAX) 5 MG EC tablet Take 5 mg by mouth daily as needed for moderate constipation.    Marland Kitchen buPROPion (WELLBUTRIN SR) 150 MG 12 hr tablet Take 150 mg by mouth daily.    . busPIRone (BUSPAR) 5 MG tablet Take 5 mg by mouth 2 (two) times daily.    . fluticasone (FLONASE) 50 MCG/ACT nasal spray Place 1 spray into both nostrils as needed.   11  . HYDROcodone-acetaminophen (NORCO) 10-325 MG tablet Take 1 tablet by mouth 2 (two) times daily. 20 tablet 0  . lactulose (CHRONULAC) 10 GM/15ML solution Take 30 mLs (20 g total) by mouth daily. 236 mL 0  . megestrol (MEGACE ES) 625 MG/5ML suspension Take 625 mg by mouth daily.    . pantoprazole (PROTONIX) 40 MG tablet Take 1 tablet (40 mg total) by mouth daily before breakfast. 30 tablet 5  . spironolactone (ALDACTONE) 100 MG  tablet Take 100 mg by mouth daily.    Marland Kitchen torsemide (DEMADEX) 20 MG tablet Take 20 mg by mouth daily.    Marland Kitchen warfarin (COUMADIN) 2.5 MG tablet Take 1 tablet (2.5 mg total) by mouth daily. 30 tablet 3  . cetirizine (ZYRTEC) 10 MG tablet Take 10 mg by mouth at bedtime as needed for allergies.   11  . QUEtiapine (SEROQUEL) 25 MG tablet Take 1 tablet (25 mg total) by mouth at bedtime. (Patient not taking: Reported on 02/18/2020)    . simvastatin (ZOCOR) 40 MG tablet Take 40 mg by mouth daily at 8 pm.  (Patient not taking: Reported on 02/18/2020)    . vitamin B-12 (CYANOCOBALAMIN)  500 MCG tablet Take 500 mcg by mouth daily. (Patient not taking: Reported on 02/18/2020)    . Vitamin D, Ergocalciferol, (DRISDOL) 1.25 MG (50000 UT) CAPS capsule Take 50,000 Units by mouth every Sunday.  (Patient not taking: Reported on 02/18/2020)       Discharge Medications: Please see discharge summary for a list of discharge medications.  Relevant Imaging Results:  Relevant Lab Results:   Additional Information SSN: [id number]  Sherie Don, LCSW

## 2020-02-27 NOTE — ED Notes (Signed)
Pt has been restless al night. Pt finally resting and sleeping. Do not want to wake pt at this time. Will obtain vitals when pt is awake. Pt presentation looks WNL.

## 2020-02-27 NOTE — Progress Notes (Signed)
ANTICOAGULATION CONSULT NOTE -   Pharmacy Consult for coumadin Indication: AVR/CVA  Allergies  Allergen Reactions  . Tape Itching    Redness, Please use "paper" tape only    Patient Measurements: Height: 5\' 8"  (172.7 cm) Weight: 52.1 kg (114 lb 13.8 oz) IBW/kg (Calculated) : 63.9  Vital Signs: Temp: 97.6 F (36.4 C) (12/10 0905) BP: 141/58 (12/10 0905) Pulse Rate: 108 (12/10 0905)  Labs: Recent Labs    02/25/20 0425 02/26/20 0554 02/27/20 0708  HGB 11.6*  --   --   HCT 34.9*  --   --   PLT 208  --   --   LABPROT 31.8* 31.1* 34.4*  INR 3.2* 3.1* 3.5*    Estimated Creatinine Clearance: 28.8 mL/min (A) (by C-G formula based on SCr of 1.26 mg/dL (H)).  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 She was seen in the ED today agitation and confusion. Her INR on admissoin is 1.7.   INR: 3.5- therapeutic Home dose: warfarin 2.5 mg daily   Goal of Therapy:  INR 2.5-3.5 Monitor platelets by anticoagulation protocol: Yes   Plan:  Warfarin 2.5 mg x 1 dose. CBC:  M-W-F Daily INR and every other day CBC  Isac Sarna, BS [place], [place] Clinical Pharmacist Pager [phone]  02/27/2020 1:49 PM

## 2020-02-27 NOTE — ED Notes (Signed)
Pt became agitated when served IVC papers. Took meds without difficulty

## 2020-02-28 LAB — PROTIME-INR
INR: 3.2 — ABNORMAL HIGH (ref 0.8–1.2)
Prothrombin Time: 31.7 seconds — ABNORMAL HIGH (ref 11.4–15.2)

## 2020-02-28 MED ORDER — WARFARIN SODIUM 2.5 MG PO TABS
2.5000 mg | ORAL_TABLET | Freq: Once | ORAL | Status: AC
  Administered 2020-02-28: 2.5 mg via ORAL
  Filled 2020-02-28: qty 1

## 2020-02-28 NOTE — ED Notes (Signed)
Patient refusing vital signs at this time.

## 2020-02-28 NOTE — Progress Notes (Signed)
ANTICOAGULATION CONSULT NOTE -   Pharmacy Consult for coumadin Indication: AVR/CVA  Allergies  Allergen Reactions   Tape Itching    Redness, Please use "paper" tape only    Patient Measurements: Height: 5\' 8"  (172.7 cm) Weight: 52.1 kg (114 lb 13.8 oz) IBW/kg (Calculated) : 63.9  Vital Signs: Temp: 98.1 F (36.7 C) (12/11 1433) Temp Source: Oral (12/11 1433) BP: 112/75 (12/11 1433) Pulse Rate: 130 (12/11 1433)  Labs: Recent Labs    02/26/20 0554 02/27/20 0708 02/28/20 1417  LABPROT 31.1* 34.4* 31.7*  INR 3.1* 3.5* 3.2*    Estimated Creatinine Clearance: 28.8 mL/min (A) (by C-G formula based on SCr of 1.26 mg/dL (H)).  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 She was seen in the ED today agitation and confusion. Her INR on admissoin is 1.7.   INR: 3.2- therapeutic Home dose: warfarin 2.5 mg daily   Goal of Therapy:  INR 2.5-3.5 Monitor platelets by anticoagulation protocol: Yes   Plan:  Warfarin 2.5 mg x 1 dose. CBC:  M-W-F Daily INR and every other day CBC  Isac Sarna, BS [place], [place] Clinical Pharmacist Pager [phone]  02/28/2020 2:59 PM

## 2020-02-28 NOTE — ED Notes (Signed)
Patient sitting up in recliner chair.  Patient refusing to get in bed at this time.

## 2020-02-28 NOTE — ED Provider Notes (Signed)
Emergency Medicine Observation Re-evaluation Note  Mary [name] is a 79 y.o. female, seen on rounds today.  Pt initially presented to the ED for complaints of Psychiatric Evaluation Currently, the patient is redirectable.  Physical Exam  BP 136/71 (BP Location: Left Arm)   Pulse (!) 107   Temp 98 F (36.7 C) (Oral)   Resp 18   Ht 5\' 8"  (1.727 m)   Wt 52.1 kg   SpO2 97%   BMI 17.46 kg/m  Physical Exam General: In no distress Cardiac: Normal pulses Lungs: Clear to auscultation Psych: Requiring frequent redirection  ED Course / MDM  EKG:EKG Interpretation  Date/Time:  Wednesday February 18 2020 16:21:50 EST Ventricular Rate:  107 PR Interval:    QRS Duration: 120 QT Interval:  398 QTC Calculation: 531 R Axis:   -25 Text Interpretation: Wide QRS rhythm Non-specific intra-ventricular conduction delay Minimal voltage criteria for LVH, may be normal variant ( Cornell product ) Borderline ECG Confirmed by Mary [name] [phone]) on 02/18/2020 9:34:11 PM  Clinical Course as of 02/28/20 1057  Sat Feb 21, 2020  0842 Comprehensive metabolic panel(!) [BM]    Clinical Course User Index [BM] Mary Chapel, MD   I have reviewed the labs performed to date as well as medications administered while in observation.  Recent changes in the last 24 hours include needs TB test for nursing facility placement.  Plan  Current plan is for placement memory care unit. Patient is under full IVC at this time.  This will need to be rescinded when has a bed at facility.   Hayden Rasmussen, MD 02/28/20 1740

## 2020-02-29 LAB — PROTIME-INR
INR: 3.7 — ABNORMAL HIGH (ref 0.8–1.2)
Prothrombin Time: 35.6 seconds — ABNORMAL HIGH (ref 11.4–15.2)

## 2020-02-29 NOTE — ED Notes (Signed)
Pt resting quietly in bed, warm blankets provided, VS updated, medicated per MAR. NAD noted. Will continue to monitor.

## 2020-02-29 NOTE — ED Notes (Signed)
Patient resting in bed with eyes closed.  Respirations even and unlabored.  Call bell in reach.

## 2020-02-29 NOTE — ED Notes (Signed)
TB test read on right arm. No induration noted, no redness, no swelling. Test is negative.

## 2020-02-29 NOTE — ED Notes (Signed)
Patient agreeable to lay down in the bed.  Patient resting in bed but awake and alert.

## 2020-02-29 NOTE — Progress Notes (Signed)
ANTICOAGULATION CONSULT NOTE -   Pharmacy Consult for coumadin Indication: AVR/CVA  Allergies  Allergen Reactions  . Tape Itching    Redness, Please use "paper" tape only    Patient Measurements: Height: 5\' 8"  (172.7 cm) Weight: 52.1 kg (114 lb 13.8 oz) IBW/kg (Calculated) : 63.9  Vital Signs: Temp: 97.8 F (36.6 C) (12/12 0535) Temp Source: Oral (12/12 0535) BP: 137/76 (12/12 0535) Pulse Rate: 115 (12/12 0535)  Labs: Recent Labs    02/27/20 0708 02/28/20 1417 02/29/20 0552  LABPROT 34.4* 31.7* 35.6*  INR 3.5* 3.2* 3.7*    Estimated Creatinine Clearance: 28.8 mL/min (A) (by C-G formula based on SCr of 1.26 mg/dL (H)).  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 She was seen in the ED today agitation and confusion. Her INR on admissoin is 1.7.   INR: 3.7- slightly supratherapeutic Home dose: warfarin 2.5 mg daily   Goal of Therapy:  INR 2.5-3.5 Monitor platelets by anticoagulation protocol: Yes   Plan:  No coumadin today CBC:  M-W-F Daily INR and every other day [place], BS [place], BCPS Clinical Pharmacist Pager [phone]  02/29/2020 9:13 AM

## 2020-02-29 NOTE — ED Notes (Signed)
Pt up to [facility] from chair, placed into bed. NAD noted at this time. NO SITTER in place at this time

## 2020-02-29 NOTE — ED Provider Notes (Signed)
Emergency Medicine Observation Re-evaluation Note  Mary [name] is a 79 y.o. female, seen on rounds today.  Pt initially presented to the ED for complaints of Psychiatric Evaluation Currently, the patient is sleeping.  Physical Exam  BP 137/76 (BP Location: Left Arm)   Pulse (!) 115   Temp 97.8 F (36.6 C) (Oral)   Resp 16   Ht 5\' 8"  (1.727 m)   Wt 52.1 kg   SpO2 96%   BMI 17.46 kg/m  Physical Exam General: Elderly Cardiac: Tachycardic Lungs: No respiratory distress Psych: Asleep  ED Course / MDM  EKG:EKG Interpretation  Date/Time:  Wednesday February 18 2020 16:21:50 EST Ventricular Rate:  107 PR Interval:    QRS Duration: 120 QT Interval:  398 QTC Calculation: 531 R Axis:   -25 Text Interpretation: Wide QRS rhythm Non-specific intra-ventricular conduction delay Minimal voltage criteria for LVH, may be normal variant ( Cornell product ) Borderline ECG Confirmed by Gerlene Fee [phone]) on 02/18/2020 9:34:11 PM  Clinical Course as of 02/29/20 0739  Sat Feb 21, 2020  0842 Comprehensive metabolic panel(!) [BM]    Clinical Course User Index [BM] [name], MD   I have reviewed the labs performed to date as well as medications administered while in observation.  Recent changes in the last 24 hours include she remains stable, awaiting placement.  Plan  Current plan is for placement in a skilled nursing facility. Patient is under full IVC at this time.   Daleen Bo, MD 02/29/20 [phone]

## 2020-03-01 LAB — PROTIME-INR
INR: 3.6 — ABNORMAL HIGH (ref 0.8–1.2)
Prothrombin Time: 34.8 seconds — ABNORMAL HIGH (ref 11.4–15.2)

## 2020-03-01 LAB — RESP PANEL BY RT-PCR (FLU A&B, COVID) ARPGX2
Influenza A by PCR: NEGATIVE
Influenza B by PCR: NEGATIVE
SARS Coronavirus 2 by RT PCR: NEGATIVE

## 2020-03-01 NOTE — ED Notes (Signed)
Pt ambulated to hall with cane, redirected to room. Up to [facility], returned to bed. Medicated per MAR. Soda and warm blankets provided.

## 2020-03-01 NOTE — Progress Notes (Signed)
ANTICOAGULATION CONSULT NOTE -   Pharmacy Consult for coumadin Indication: AVR/CVA  Allergies  Allergen Reactions  . Tape Itching    Redness, Please use "paper" tape only    Patient Measurements: Height: 5\' 8"  (172.7 cm) Weight: 52.1 kg (114 lb 13.8 oz) IBW/kg (Calculated) : 63.9  Vital Signs: Temp: 97.5 F (36.4 C) (12/13 0611) Temp Source: Oral (12/13 0611) BP: 126/68 (12/13 0611) Pulse Rate: 107 (12/13 0611)  Labs: Recent Labs    02/28/20 1417 02/29/20 0552 03/01/20 0500  LABPROT 31.7* 35.6* 34.8*  INR 3.2* 3.7* 3.6*    Estimated Creatinine Clearance: 28.8 mL/min (A) (by C-G formula based on SCr of 1.26 mg/dL (H)).  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 She was seen in the ED today agitation and confusion. Her INR on admissoin is 1.7.   INR: 3.6- slightly supratherapeutic Home dose: warfarin 2.5 mg daily   Goal of Therapy:  INR 2.5-3.5 Monitor platelets by anticoagulation protocol: Yes   Plan:  No coumadin today CBC:  M-W-F Daily INR and every other day CBC  Isac Sarna, BS [place], BCPS Clinical Pharmacist Pager [phone]  03/01/2020 8:41 AM

## 2020-03-01 NOTE — ED Notes (Signed)
Pt. Stated they had a headache and asked for pain medications for the headache. Notified pt. That they have tylenol that they could take and pt. Refused the tylenol.

## 2020-03-01 NOTE — ED Notes (Signed)
Pt blood work obtained and walked to lab, VS updated. Resting in bed.

## 2020-03-01 NOTE — ED Notes (Signed)
Pt resting quietly in bed with eyes closed, NAD noted. Will continue to monitor.

## 2020-03-01 NOTE — ED Notes (Signed)
Yelling out "help" and "I'm going to get up and leave here." Attempted to redirect. States she is tired of being here and ready to go home. Plan of care discussed. Repositioned. Denies pain. Will cont to monitor.

## 2020-03-01 NOTE — ED Notes (Signed)
Pt given a bed bath, washed hair, stood to change mepilex on sacrum area, bed and linen changed.  Diaper and undz.  Pt cooperative and appreciative.  Right arm dressed with 4X4 and kerlex.

## 2020-03-01 NOTE — ED Notes (Signed)
Pt restless in room, has been screaming "Help" since beginning of shift with NAD noted each time pt was addressed.. Pt has been repositioned, and had warm blankets provided numerous times. Medicated per MAR. NAD noted.

## 2020-03-01 NOTE — TOC Progression Note (Signed)
Transition of Care [facility]) - Progression Note    Patient Details  Name: Mary [name] MRN: [id number] Date of Birth: 08/25/1940  Transition of [facility]) CM/SW [place], [place] Phone Number: 03/01/2020, 4:33 PM  Clinical Narrative:    CSW received call from pts DSS worker stating that Isaiah Blakes [phone]) [phone]) with [facility] would like TOC to call. CSW spoke with Ms. Mindi Junker who inquired about pts medications. Ms. Mindi Junker needed to assess pt via FaceTime call. Pt is being looked at to go to a [facility]F in [place], [name] until bed locally becomes available. CSW completed FaceTime call in room with pt. CSW updated attending that we would need COVID test completed. Per Ms. Mindi Junker she will call with update tomorrow morning. TOC to follow.      Barriers to Discharge: ED Psych evaluation  Expected Discharge Plan and Services                                                 Social Determinants of Health (SDOH) Interventions    Readmission Risk Interventions No flowsheet data found.

## 2020-03-01 NOTE — ED Notes (Signed)
Lab reports pain refused blood work, will reassess to obtain.

## 2020-03-01 NOTE — ED Notes (Signed)
Pt in recliner watching tv and eating her dinner at this time.

## 2020-03-02 LAB — PROTIME-INR
INR: 2.8 — ABNORMAL HIGH (ref 0.8–1.2)
Prothrombin Time: 28.5 seconds — ABNORMAL HIGH (ref 11.4–15.2)

## 2020-03-02 MED ORDER — WARFARIN SODIUM 2.5 MG PO TABS
2.5000 mg | ORAL_TABLET | Freq: Once | ORAL | Status: AC
  Administered 2020-03-02: 2.5 mg via ORAL
  Filled 2020-03-02: qty 1

## 2020-03-02 NOTE — Progress Notes (Signed)
ANTICOAGULATION CONSULT NOTE -   Pharmacy Consult for coumadin Indication: AVR/CVA  Allergies  Allergen Reactions  . Tape Itching    Redness, Please use "paper" tape only    Patient Measurements: Height: 5\' 8"  (172.7 cm) Weight: 52.1 kg (114 lb 13.8 oz) IBW/kg (Calculated) : 63.9  Vital Signs: Temp: 97.5 F (36.4 C) (12/14 0616) Temp Source: Oral (12/14 0616) BP: 135/76 (12/14 0616) Pulse Rate: 99 (12/14 0616)  Labs: Recent Labs    02/29/20 0552 03/01/20 0500 03/02/20 0747  LABPROT 35.6* 34.8* 28.5*  INR 3.7* 3.6* 2.8*    Estimated Creatinine Clearance: 28.8 mL/min (A) (by C-G formula based on SCr of 1.26 mg/dL (H)).  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 She was seen in the ED today agitation and confusion. Her INR on admissoin is 1.7.   INR: 2.8 therapeutic Home dose: warfarin 2.5 mg daily   Goal of Therapy:  INR 2.5-3.5 Monitor platelets by anticoagulation protocol: Yes   Plan:  Counmadin 2.5mg  today CBC:  M-W-F Daily INR and every other day CBC  Isac Sarna, BS [place], BCPS Clinical Pharmacist Pager [phone]  03/02/2020 11:19 AM

## 2020-03-02 NOTE — ED Notes (Signed)
Pt up and ambulatory to restroom with a one person assist. At patient request, assisted back into bed. Bed is locked in the lowest position, side rails x3. Provided with warm blankets and repositioned per request. Pt remains closest to nurses station for better observation. No signs of distress noted at this time.

## 2020-03-02 NOTE — ED Notes (Signed)
This RN to bedside for rounding. Pt appears to be sitting in chair, asleep with no signs of distress noted. Chair is locked, table place in front of patient. Respirations are even and unlabored with equal chest rise and fall. Will continue to monitor at this time.

## 2020-03-02 NOTE — ED Notes (Signed)
Pt provided with crackers and a ginger ale per request at this time. No signs of distress noted. Bed remains locked in the lowest position, side rails x2, roomed closest to nurses station. Respirations are even and unlabored with equal chest rise and fall.

## 2020-03-02 NOTE — Clinical Social Work Note (Addendum)
CSW spoke with [facility] supervisor after supervisor spoke with DSS. Per supervisor, DSS informed her the patient has been declined from [place] is supportive of patient being left in ED by husband as "he can't care for her" at home anymore. TOC to follow.  Addendum: DSS requesting that patient be faxed out to [facility] for memory care. Referral faxed to [facility].  4:09pm-CSW received call from [facility] in admissions with [facility]. Per Barbara Cower can make a tentative offer provided the patient's husband can private pay for memory care and they can accommodate a female.

## 2020-03-02 NOTE — ED Notes (Signed)
Pt sitting up in bed, eating lunch tray without distress at this time.

## 2020-03-02 NOTE — ED Notes (Signed)
Family at bedside sitting with patient at this time.
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
## 2020-03-02 NOTE — ED Notes (Addendum)
Pt assisted back into bed by staff at this time. Provided with clean bed linens and pillow cases. Pt repositioned in bed, bed is locked in the lowest position, side rails x3, and remains closest to nurses station for better observation.

## 2020-03-03 LAB — PROTIME-INR
INR: 2.5 — ABNORMAL HIGH (ref 0.8–1.2)
Prothrombin Time: 26.4 seconds — ABNORMAL HIGH (ref 11.4–15.2)

## 2020-03-03 MED ORDER — WARFARIN SODIUM 2.5 MG PO TABS
2.5000 mg | ORAL_TABLET | Freq: Once | ORAL | Status: DC
  Filled 2020-03-03: qty 1

## 2020-03-03 MED ORDER — ALPRAZOLAM 0.5 MG PO TABS
0.5000 mg | ORAL_TABLET | Freq: Three times a day (TID) | ORAL | Status: DC | PRN
  Administered 2020-03-03 (×2): 0.5 mg via ORAL
  Filled 2020-03-03 (×2): qty 1

## 2020-03-03 NOTE — Progress Notes (Signed)
ANTICOAGULATION CONSULT NOTE -   Pharmacy Consult for coumadin Indication: AVR/CVA  Allergies  Allergen Reactions  . Tape Itching    Redness, Please use "paper" tape only    Patient Measurements: Height: 5\' 8"  (172.7 cm) Weight: 52.1 kg (114 lb 13.8 oz) IBW/kg (Calculated) : 63.9  Vital Signs: Temp: 98.2 F (36.8 C) (12/15 0049) BP: 117/65 (12/15 0820) Pulse Rate: 105 (12/15 0820)  Labs: Recent Labs    03/01/20 0500 03/02/20 0747 03/03/20 0726  LABPROT 34.8* 28.5* 26.4*  INR 3.6* 2.8* 2.5*    Estimated Creatinine Clearance: 28.8 mL/min (A) (by C-G formula based on SCr of 1.26 mg/dL (H)).  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 She was seen in the ED today agitation and confusion. Her INR on admissoin is 1.7.   INR: 2.5 therapeutic Home dose: warfarin 2.5 mg daily   Goal of Therapy:  INR 2.5-3.5 Monitor platelets by anticoagulation protocol: Yes   Plan:  Counmadin 2.5mg  today CBC:  M-W-F Daily INR and every other day CBC  Margot Ables, PharmD Clinical Pharmacist 03/03/2020 9:01 AM

## 2020-03-03 NOTE — ED Notes (Signed)
Posey Pad in place.

## 2020-03-03 NOTE — ED Notes (Signed)
Pt is refusing blood work via [employer] manager. Pt states that she will let him draw it in a little while.

## 2020-03-03 NOTE — ED Notes (Signed)
Pt was given a lunch tray

## 2020-03-03 NOTE — Clinical Social Work Note (Signed)
CSW spoke with Melissa in admissions at [facility]. Per Lenna Sciara, the husband expressed concerns with signing over his wife's monthly check to the facility even though she will be private pay at an ALF. Melissa stated the bed offer still stands for the patient.  CSW spoke with Jolene Schimke, supervisor with DSS. Per Ms. Enrigue Catena with [facility] ALF is supposed to assess the patient in person and will notify DSS of their decision today. TOC to follow.

## 2020-03-03 NOTE — ED Notes (Signed)
Assisted pt to [facility]. She voided light yellow urine. She required 2 person assist. Very weak and unsteady with gait and standing.

## 2020-03-03 NOTE — ED Notes (Signed)
Pt given dinner tray. Ate approx 90%.

## 2020-03-04 LAB — RESP PANEL BY RT-PCR (FLU A&B, COVID) ARPGX2
Influenza A by PCR: NEGATIVE
Influenza B by PCR: NEGATIVE
SARS Coronavirus 2 by RT PCR: NEGATIVE

## 2020-03-04 MED ORDER — ALPRAZOLAM 0.5 MG PO TABS: 0.5000 mg | ORAL_TABLET | Freq: Three times a day (TID) | ORAL | 0 refills | Status: AC | PRN

## 2020-03-04 MED ORDER — WARFARIN SODIUM 2.5 MG PO TABS
2.5000 mg | ORAL_TABLET | Freq: Once | ORAL | Status: AC
  Administered 2020-03-04: 2.5 mg via ORAL
  Filled 2020-03-04: qty 1

## 2020-03-04 NOTE — ED Notes (Signed)
[place] communication called to set up transportation at this time for pt.

## 2020-03-04 NOTE — Clinical Social Work Note (Signed)
Transition of Care [facility]) - Emergency Department Mini Assessment  Patient Details  Name: Mary [name] MRN: [id number] Date of Birth: [birth year]  Transition of [facility]) CM/SW Contact:    Sherie Don, LCSW Phone Number: 03/04/2020, 4:25 PM   Clinical Narrative: Patient's husband and DSS caseworker, [place], are now agreeable to patient discharging to [facility] memory care unit as private pay while DSS continues to assist husband with placing the patient at an ALF. Discharge summary faxed to [facility] and [place] confirmed it was received. Pending patient's negative COVID test, she can discharge. The number to call for report is [phone] and she will go to room 600B. TOC signing off.  ED Mini Assessment: What brought you to the Emergency Department? : Psych evaluation Barriers to Discharge: ED Barriers Resolved,ED Patient/Family Requesting Placement but Has No Payor,ED Facility/Family Refusing to Allow Patient to Return Barrier interventions: Patient will discharge to [facility] memory care as private pay Means of departure: Ambulance Interventions which prevented an admission or readmission: SNF Placement  Patient Contact and Communications Key Contact 1: [name] Key Contact 2: [name] Contact Date: 03/04/20     Call outcome: Patient will discharge today Patient states their goals for this hospitalization and ongoing recovery are:: Go to memory care CMS Medicare.gov Compare Post Acute Care list provided to:: Patient Represenative (must comment) Choice offered to / list presented to : Spouse  Admission diagnosis:  Psychiatric Evaluation Patient Active Problem List   Diagnosis Date Noted  . Agitation 02/21/2020  . Confusion 02/21/2020  . Hyperkalemia 01/15/2020  . 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 Anticoagulant long-term use 01/15/2020  . 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 Acute on  chronic combined systolic and diastolic CHF (congestive heart failure) ([place])   . Anasarca 04/25/2019  . Hypoalbuminemia 04/25/2019  . Persistent atrial fibrillation ([place]) 04/25/2019  . CKD (chronic kidney disease), stage III ([place]) 04/25/2019  . Anemia due to chronic kidney disease 04/25/2019  . Pressure injury of skin 04/24/2019  . Fall 04/23/2019  . S/P right knee arthroscopy 04/13/2015  . OSA (obstructive sleep apnea) 01/01/2013  . Diabetes ([place]) 10/23/2012  . Aortic valve disorder 06/30/2010  . CVA (cerebral vascular accident) ([place]) 06/30/2010  . S/P aortic valve replacement 06/30/2010  . Hyperlipidemia 09/29/2008  . Obesity 09/29/2008  . Essential hypertension 09/29/2008  . DEGENERATIVE JOINT DISEASE 09/29/2008   PCP:  [name], MD Pharmacy:   [facility] [address], [name] - [address] [name] [address] Phone: [phone] Fax: [phone]

## 2020-03-04 NOTE — ED Provider Notes (Signed)
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  The patient arrived under IVC by her husband, a family member.  She has a history of confusion and some dementia.   She was evaluated by our behavioral health team who echoed concerns that her 34 year old husband was having a difficult time managing her at home and keeping her safe.  Due to her multiple chronic medical problems and her dementia, it was determined that she would need placement in a long-term memory care.  She was boarded in our ED from 02/18/20 until the date of discharge.  During this time her blood tests were checked and found to be stable.  She had a CT of the brain on 02/18/20 which showed no acute findings but chronic infarct.  She had a DVT ultrasound of the legs on 02/18/20 which showed no acute thrombosis.  She had an xray on 02/18/20 which showed small right sided pleural effusion, stable from prior imaging.  She is on coumadin.  Her INR on 03/03/20 was 2.5 and therapeutic.  Other labs showed hemoglobin stable at 11.6, WBC normal at 6.3, Covid/Flu negative on 12/13 and retest for discharge now pending on 03/04/20.  CMP with levels stable, Cr improved from baseline at 1.26 ont his check.    She was felt to be medically stable for transfer to facility at the time of discharge.   The patient needs assistance for ambulation.   She can have a regular diet.  *  Medications  Start These New Medications  Bupropion 12 hour tablet 150 mg once daily Torsemide 20 mg tablet once daily Spironolactone 100 mg tablet once daily  Continue The following Medications  Medication List    TAKE these medications    acetaminophen 500 MG tablet Commonly known as: TYLENOL Take 500 mg by mouth 2 (two) times daily as needed for mild pain or moderate pain.   ALPRAZolam 0.5 MG tablet Commonly known as: XANAX Take 1 tablet (0.5 mg total) by mouth 3 (three) times daily as needed for anxiety.   bisacodyl 5 MG EC tablet Commonly known as: DULCOLAX Take 5 mg by mouth daily as needed for moderate constipation.   buPROPion 150 MG 12 hr tablet Commonly known as: WELLBUTRIN SR Take 150 mg by mouth daily.   cetirizine 10 MG tablet Commonly known as: ZYRTEC Take 10 mg by mouth at bedtime as needed for allergies.   fluticasone 50 MCG/ACT nasal spray Commonly known as: FLONASE Place 1 spray into both nostrils as needed.   HYDROcodone-acetaminophen 10-325 MG tablet Commonly known as: NORCO Take 1 tablet by mouth 2 (two) times daily.   lactulose 10 GM/15ML solution Commonly known as: CHRONULAC Take 30 mLs (20 g total) by mouth daily.   pantoprazole 40 MG tablet Commonly known as: PROTONIX Take 1 tablet (40 mg total) by mouth daily before breakfast.   QUEtiapine 25 MG tablet Commonly known as: SEROQUEL Take 1 tablet (25 mg total) by mouth at bedtime.   simvastatin 40 MG tablet Commonly known as: ZOCOR Take 40 mg by mouth daily at 8 pm.   vitamin B-12 500 MCG tablet Commonly known as: CYANOCOBALAMIN Take 500 mcg by mouth daily.   Vitamin D (Ergocalciferol) 1.25  MG (50000 UNIT) Caps capsule Commonly known as: DRISDOL Take 50,000 Units by mouth every Sunday.      Coumadin Dosing:  2.5 mg tablet by mouth once daily  Code Status:  Full Code   Wyvonnia Dusky, MD 03/04/20 1549

## 2020-03-04 NOTE — Discharge Instructions (Addendum)
A short term xanax prescription was provided for 10 days.  We do not provide long-term narcotic prescriptions out of the emergency department.  I would advise that her primary doctor or facility doctor refill these prescriptions as needed.

## 2020-03-04 NOTE — Clinical Social Work Note (Addendum)
CSW called patient's Mary [name] caseworker, Mary [name], regarding Mary [name] from [place] the patient yesterday. Per Ms. Mary Memos, Ms. Mary [name] did not assess the patient yesterday and will be coming this morning to assess the patient. CSW explained that if Mary [name] declines the patient, she will need to discharge to [name] or home with the husband as waiting for a different placement option is not appropriate. Mary [name] awaiting call from Mary [name] regarding Mary [name]'s decision.  Addendum: 12:31pm-CSW spoke with Mary [name] at [name] regarding placement. Per Mary [name] can accept the patient today if the husband will agree to private pay. Patient will need a COVID test.  12:57pm-CSW spoke with husband regarding the patient needing to discharge home or to [name]. Husband stated he cannot take the patient home and cannot pay for [name] because the financial assistance application through [name] is for an ALF. CSW explained that placement at an ALF can be worked on while the patient is placed at [name]. Husband stated [name] told him not to make any decisions without calling them first and he would not take the patient home. CSW explained that since a viable discharge plan is in place, the patient will need to go to [name] or home and reiterated Mary [name] does not determine whether a patient stays in the [name] or discharges. Husband requested call from Mary [name] with [name]. CSW left voicemail with Mary [name] Sciara to call husband.

## 2020-03-04 NOTE — Progress Notes (Signed)
ANTICOAGULATION CONSULT NOTE -   Pharmacy Consult for coumadin Indication: AVR/CVA  Allergies  Allergen Reactions   Tape Itching    Redness, Please use "paper" tape only    Patient Measurements: Height: 5\' 8"  (172.7 cm) Weight: 52.1 kg (114 lb 13.8 oz) IBW/kg (Calculated) : 63.9  Vital Signs: Temp: 98 F (36.7 C) (12/16 0638) BP: 113/65 (12/16 0638) Pulse Rate: 108 (12/16 0638)  Labs: Recent Labs    03/02/20 0747 03/03/20 0726  LABPROT 28.5* 26.4*  INR 2.8* 2.5*    Estimated Creatinine Clearance: 28.8 mL/min (A) (by C-G formula based on SCr of 1.26 mg/dL (H)).  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 She was seen in the ED today agitation and confusion. Her INR on admissoin is 1.7.   INR: 2.5 therapeutic on 12/15 Patient not given 12/15 dose for unknown reason.  Home dose: warfarin 2.5 mg daily   Goal of Therapy:  INR 2.5-3.5 Monitor platelets by anticoagulation protocol: Yes   Plan:  Counmadin 2.5mg  today CBC:  M-W-F Daily INR and every other day CBC  Margot Ables, PharmD Clinical Pharmacist 03/04/2020 11:01 AM

## 2020-04-08 ENCOUNTER — Ambulatory Visit (INDEPENDENT_AMBULATORY_CARE_PROVIDER_SITE_OTHER): Payer: Medicare Other | Admitting: Gastroenterology

## 2020-04-09 ENCOUNTER — Ambulatory Visit: Payer: Self-pay | Admitting: *Deleted

## 2020-08-11 ENCOUNTER — Inpatient Hospital Stay (HOSPITAL_COMMUNITY)
Admission: EM | Admit: 2020-08-11 | Discharge: 2020-08-19 | DRG: 522 | Disposition: A | Discharge status: Skilled Nursing Facility | Payer: Medicare Other | Attending: Internal Medicine | Admitting: Internal Medicine

## 2020-08-11 ENCOUNTER — Other Ambulatory Visit: Payer: Self-pay

## 2020-08-11 ENCOUNTER — Emergency Department (HOSPITAL_COMMUNITY): Payer: Medicare Other

## 2020-08-11 DIAGNOSIS — I50812 Chronic right heart failure: Secondary | ICD-10-CM

## 2020-08-11 DIAGNOSIS — W19XXXA Unspecified fall, initial encounter: Secondary | ICD-10-CM | POA: Diagnosis not present

## 2020-08-11 DIAGNOSIS — K7460 Unspecified cirrhosis of liver: Secondary | ICD-10-CM | POA: Diagnosis present

## 2020-08-11 DIAGNOSIS — M25562 Pain in left knee: Secondary | ICD-10-CM | POA: Diagnosis present

## 2020-08-11 DIAGNOSIS — I951 Orthostatic hypotension: Secondary | ICD-10-CM | POA: Diagnosis present

## 2020-08-11 DIAGNOSIS — I50810 Right heart failure, unspecified: Secondary | ICD-10-CM | POA: Diagnosis not present

## 2020-08-11 DIAGNOSIS — E440 Moderate protein-calorie malnutrition: Secondary | ICD-10-CM | POA: Diagnosis present

## 2020-08-11 DIAGNOSIS — N1832 Chronic kidney disease, stage 3b: Secondary | ICD-10-CM

## 2020-08-11 DIAGNOSIS — D539 Nutritional anemia, unspecified: Secondary | ICD-10-CM | POA: Diagnosis present

## 2020-08-11 DIAGNOSIS — S72009A Fracture of unspecified part of neck of unspecified femur, initial encounter for closed fracture: Secondary | ICD-10-CM | POA: Diagnosis present

## 2020-08-11 DIAGNOSIS — I2722 Pulmonary hypertension due to left heart disease: Secondary | ICD-10-CM | POA: Diagnosis present

## 2020-08-11 DIAGNOSIS — D62 Acute posthemorrhagic anemia: Secondary | ICD-10-CM | POA: Diagnosis not present

## 2020-08-11 DIAGNOSIS — I130 Hypertensive heart and chronic kidney disease with heart failure and stage 1 through stage 4 chronic kidney disease, or unspecified chronic kidney disease: Secondary | ICD-10-CM | POA: Diagnosis present

## 2020-08-11 DIAGNOSIS — I5042 Chronic combined systolic (congestive) and diastolic (congestive) heart failure: Secondary | ICD-10-CM | POA: Diagnosis present

## 2020-08-11 DIAGNOSIS — I4821 Permanent atrial fibrillation: Secondary | ICD-10-CM | POA: Diagnosis present

## 2020-08-11 DIAGNOSIS — Z20822 Contact with and (suspected) exposure to covid-19: Secondary | ICD-10-CM | POA: Diagnosis present

## 2020-08-11 DIAGNOSIS — Y92039 Unspecified place in apartment as the place of occurrence of the external cause: Secondary | ICD-10-CM

## 2020-08-11 DIAGNOSIS — W010XXA Fall on same level from slipping, tripping and stumbling without subsequent striking against object, initial encounter: Secondary | ICD-10-CM | POA: Diagnosis present

## 2020-08-11 DIAGNOSIS — G4733 Obstructive sleep apnea (adult) (pediatric): Secondary | ICD-10-CM | POA: Diagnosis present

## 2020-08-11 DIAGNOSIS — R0609 Other forms of dyspnea: Secondary | ICD-10-CM | POA: Diagnosis present

## 2020-08-11 DIAGNOSIS — I10 Essential (primary) hypertension: Secondary | ICD-10-CM | POA: Diagnosis present

## 2020-08-11 DIAGNOSIS — R54 Age-related physical debility: Secondary | ICD-10-CM | POA: Diagnosis present

## 2020-08-11 DIAGNOSIS — E1122 Type 2 diabetes mellitus with diabetic chronic kidney disease: Secondary | ICD-10-CM | POA: Diagnosis present

## 2020-08-11 DIAGNOSIS — Z419 Encounter for procedure for purposes other than remedying health state, unspecified: Secondary | ICD-10-CM

## 2020-08-11 DIAGNOSIS — Z833 Family history of diabetes mellitus: Secondary | ICD-10-CM

## 2020-08-11 DIAGNOSIS — Z8673 Personal history of transient ischemic attack (TIA), and cerebral infarction without residual deficits: Secondary | ICD-10-CM

## 2020-08-11 DIAGNOSIS — I2729 Other secondary pulmonary hypertension: Secondary | ICD-10-CM | POA: Diagnosis not present

## 2020-08-11 DIAGNOSIS — I071 Rheumatic tricuspid insufficiency: Secondary | ICD-10-CM | POA: Diagnosis present

## 2020-08-11 DIAGNOSIS — N183 Chronic kidney disease, stage 3 unspecified: Secondary | ICD-10-CM | POA: Diagnosis present

## 2020-08-11 DIAGNOSIS — R64 Cachexia: Secondary | ICD-10-CM | POA: Diagnosis present

## 2020-08-11 DIAGNOSIS — Z96649 Presence of unspecified artificial hip joint: Secondary | ICD-10-CM

## 2020-08-11 DIAGNOSIS — M1990 Unspecified osteoarthritis, unspecified site: Secondary | ICD-10-CM | POA: Diagnosis present

## 2020-08-11 DIAGNOSIS — Z681 Body mass index (BMI) 19 or less, adult: Secondary | ICD-10-CM | POA: Diagnosis not present

## 2020-08-11 DIAGNOSIS — Z9119 Patient's noncompliance with other medical treatment and regimen: Secondary | ICD-10-CM

## 2020-08-11 DIAGNOSIS — L409 Psoriasis, unspecified: Secondary | ICD-10-CM | POA: Diagnosis present

## 2020-08-11 DIAGNOSIS — E785 Hyperlipidemia, unspecified: Secondary | ICD-10-CM | POA: Diagnosis present

## 2020-08-11 DIAGNOSIS — Z7901 Long term (current) use of anticoagulants: Secondary | ICD-10-CM

## 2020-08-11 DIAGNOSIS — Y93E3 Activity, vacuuming: Secondary | ICD-10-CM

## 2020-08-11 DIAGNOSIS — S72002A Fracture of unspecified part of neck of left femur, initial encounter for closed fracture: Secondary | ICD-10-CM | POA: Diagnosis present

## 2020-08-11 DIAGNOSIS — I272 Pulmonary hypertension, unspecified: Secondary | ICD-10-CM

## 2020-08-11 DIAGNOSIS — Z952 Presence of prosthetic heart valve: Secondary | ICD-10-CM

## 2020-08-11 DIAGNOSIS — S82892A Other fracture of left lower leg, initial encounter for closed fracture: Secondary | ICD-10-CM | POA: Diagnosis present

## 2020-08-11 DIAGNOSIS — R791 Abnormal coagulation profile: Secondary | ICD-10-CM | POA: Diagnosis present

## 2020-08-11 DIAGNOSIS — I4819 Other persistent atrial fibrillation: Secondary | ICD-10-CM

## 2020-08-11 DIAGNOSIS — S80812A Abrasion, left lower leg, initial encounter: Secondary | ICD-10-CM | POA: Diagnosis present

## 2020-08-11 DIAGNOSIS — Z79899 Other long term (current) drug therapy: Secondary | ICD-10-CM

## 2020-08-11 DIAGNOSIS — I340 Nonrheumatic mitral (valve) insufficiency: Secondary | ICD-10-CM | POA: Diagnosis not present

## 2020-08-11 DIAGNOSIS — I361 Nonrheumatic tricuspid (valve) insufficiency: Secondary | ICD-10-CM

## 2020-08-11 DIAGNOSIS — E1140 Type 2 diabetes mellitus with diabetic neuropathy, unspecified: Secondary | ICD-10-CM | POA: Diagnosis present

## 2020-08-11 DIAGNOSIS — I342 Nonrheumatic mitral (valve) stenosis: Secondary | ICD-10-CM | POA: Diagnosis not present

## 2020-08-11 LAB — CBG MONITORING, ED: Glucose-Capillary: 99 mg/dL (ref 70–99)

## 2020-08-11 LAB — COMPREHENSIVE METABOLIC PANEL
ALT: 16 U/L (ref 0–44)
AST: 28 U/L (ref 15–41)
Albumin: 4 g/dL (ref 3.5–5.0)
Alkaline Phosphatase: 109 U/L (ref 38–126)
Anion gap: 10 (ref 5–15)
BUN: 24 mg/dL — ABNORMAL HIGH (ref 8–23)
CO2: 25 mmol/L (ref 22–32)
Calcium: 9.7 mg/dL (ref 8.9–10.3)
Chloride: 100 mmol/L (ref 98–111)
Creatinine, Ser: 1.31 mg/dL — ABNORMAL HIGH (ref 0.44–1.00)
GFR, Estimated: 41 mL/min — ABNORMAL LOW (ref >60)
Glucose, Bld: 100 mg/dL — ABNORMAL HIGH (ref 70–99)
Potassium: 3.8 mmol/L (ref 3.5–5.1)
Sodium: 135 mmol/L (ref 135–145)
Total Bilirubin: 1.2 mg/dL (ref 0.3–1.2)
Total Protein: 9 g/dL — ABNORMAL HIGH (ref 6.5–8.1)

## 2020-08-11 LAB — PROTIME-INR
INR: 2.2 — ABNORMAL HIGH (ref 0.8–1.2)
Prothrombin Time: 24.2 seconds — ABNORMAL HIGH (ref 11.4–15.2)

## 2020-08-11 LAB — URINALYSIS, ROUTINE W REFLEX MICROSCOPIC
Bilirubin Urine: NEGATIVE
Glucose, UA: NEGATIVE mg/dL
Hgb urine dipstick: NEGATIVE
Ketones, ur: NEGATIVE mg/dL
Leukocytes,Ua: NEGATIVE
Nitrite: NEGATIVE
Protein, ur: NEGATIVE mg/dL
Specific Gravity, Urine: 1.005 (ref 1.005–1.030)
pH: 6 (ref 5.0–8.0)

## 2020-08-11 LAB — RESP PANEL BY RT-PCR (FLU A&B, COVID) ARPGX2
Influenza A by PCR: NEGATIVE
Influenza B by PCR: NEGATIVE
SARS Coronavirus 2 by RT PCR: NEGATIVE

## 2020-08-11 LAB — CBC
HCT: 38.4 % (ref 36.0–46.0)
Hemoglobin: 11.8 g/dL — ABNORMAL LOW (ref 12.0–15.0)
MCH: 33.8 pg (ref 26.0–34.0)
MCHC: 30.7 g/dL (ref 30.0–36.0)
MCV: 110 fL — ABNORMAL HIGH (ref 80.0–100.0)
Platelets: 176 10*3/uL (ref 150–400)
RBC: 3.49 MIL/uL — ABNORMAL LOW (ref 3.87–5.11)
RDW: 14.6 % (ref 11.5–15.5)
WBC: 9.3 10*3/uL (ref 4.0–10.5)
nRBC: 0 % (ref 0.0–0.2)

## 2020-08-11 LAB — LACTIC ACID, PLASMA: Lactic Acid, Venous: 1.9 mmol/L (ref 0.5–1.9)

## 2020-08-11 LAB — SAMPLE TO BLOOD BANK

## 2020-08-11 MED ORDER — ALPRAZOLAM 0.5 MG PO TABS: 0.5000 mg | ORAL_TABLET | Freq: Three times a day (TID) | ORAL | Status: DC | PRN

## 2020-08-11 MED ORDER — LACTULOSE 10 GM/15ML PO SOLN
20.0000 g | Freq: Every day | ORAL | Status: DC
  Administered 2020-08-12 – 2020-08-19 (×7): 20 g via ORAL
  Filled 2020-08-11 (×7): qty 30

## 2020-08-11 MED ORDER — DOCUSATE SODIUM 100 MG PO CAPS
100.0000 mg | ORAL_CAPSULE | Freq: Two times a day (BID) | ORAL | Status: DC
  Administered 2020-08-11 – 2020-08-12 (×3): 100 mg via ORAL
  Filled 2020-08-11 (×3): qty 1

## 2020-08-11 MED ORDER — HEPARIN (PORCINE) 25000 UT/250ML-% IV SOLN
1200.0000 [IU]/h | INTRAVENOUS | Status: AC
  Administered 2020-08-11: 700 [IU]/h via INTRAVENOUS
  Administered 2020-08-12: 950 [IU]/h via INTRAVENOUS
  Filled 2020-08-11 (×2): qty 250

## 2020-08-11 MED ORDER — TORSEMIDE 20 MG PO TABS
20.0000 mg | ORAL_TABLET | Freq: Every day | ORAL | Status: DC
  Administered 2020-08-12 – 2020-08-19 (×7): 20 mg via ORAL
  Filled 2020-08-11 (×8): qty 1

## 2020-08-11 MED ORDER — SIMVASTATIN 20 MG PO TABS: 40.0000 mg | ORAL_TABLET | Freq: Every day | ORAL | Status: DC

## 2020-08-11 MED ORDER — MORPHINE SULFATE (PF) 2 MG/ML IV SOLN
0.5000 mg | INTRAVENOUS | Status: DC | PRN
  Administered 2020-08-11: 0.5 mg via INTRAVENOUS
  Filled 2020-08-11: qty 1

## 2020-08-11 MED ORDER — BUSPIRONE HCL 5 MG PO TABS
5.0000 mg | ORAL_TABLET | Freq: Two times a day (BID) | ORAL | Status: DC
  Administered 2020-08-11 – 2020-08-19 (×15): 5 mg via ORAL
  Filled 2020-08-11 (×15): qty 1

## 2020-08-11 MED ORDER — MORPHINE SULFATE (PF) 4 MG/ML IV SOLN
4.0000 mg | Freq: Once | INTRAVENOUS | Status: AC
  Administered 2020-08-11: 4 mg via INTRAVENOUS
  Filled 2020-08-11: qty 1

## 2020-08-11 MED ORDER — BISACODYL 5 MG PO TBEC: 5.0000 mg | DELAYED_RELEASE_TABLET | Freq: Every day | ORAL | Status: DC | PRN

## 2020-08-11 MED ORDER — HYDROCODONE-ACETAMINOPHEN 5-325 MG PO TABS
1.0000 | ORAL_TABLET | Freq: Four times a day (QID) | ORAL | Status: DC | PRN
  Administered 2020-08-11 – 2020-08-13 (×5): 2 via ORAL
  Administered 2020-08-14: 1 via ORAL
  Administered 2020-08-14 – 2020-08-15 (×2): 2 via ORAL
  Filled 2020-08-11 (×7): qty 2
  Filled 2020-08-11: qty 1

## 2020-08-11 MED ORDER — PANTOPRAZOLE SODIUM 40 MG PO TBEC
40.0000 mg | DELAYED_RELEASE_TABLET | Freq: Every day | ORAL | Status: DC
  Administered 2020-08-12 – 2020-08-19 (×7): 40 mg via ORAL
  Filled 2020-08-11 (×7): qty 1

## 2020-08-11 MED ORDER — BUPROPION HCL ER (SR) 150 MG PO TB12
150.0000 mg | ORAL_TABLET | Freq: Every day | ORAL | Status: DC
  Administered 2020-08-12 – 2020-08-19 (×7): 150 mg via ORAL
  Filled 2020-08-11 (×7): qty 1

## 2020-08-11 NOTE — H&P (Addendum)
TRH H&P   Patient Demographics:    Mary [name], is a 80 y.o. female  MRN: [id number]   DOB - 02-Nov-1940  Admit Date - 08/11/2020  Outpatient Primary MD for the patient is Hague, Rosalyn Charters, MD  Referring MD/NP/PA: PA [place]  Patient coming from: SNF  Chief Complaint  Patient presents with  . Fall      HPI:    Mary [name]  is a 80 y.o. female, with a history of persistent atrial fibrillation, chronic diastolic CHF, recurrent pleural effusions, mechanical aortic valve replacement 1995 on anticoagulation, HTN, HLD, OSA, cirrhosis, orthostatic hypotension, history of CVA, DM type II. Patient lives at independent living facility [facility], she presents with mechanical fall and hip pain, patient tripped over the cord while vacuuming her floor, and fell onto her left side, reports she hit her left side, including her head, initially she had headache which has resolved, reports she has significant left hip pain upon impaction on the ground, endorses pain symptoms spanning from her ankle to her hip, she denies any dizziness, lightheadedness, preceding the fall, chest pain, no shortness of breath. -In ED INR was 2.2, she is on warfarin for mechanical valve and A. fib, creatinine at baseline 1.3, lactic acid within normal limit at 1.9, hemoglobin 11.8, platelets 176K, ankle x-ray significant for fracture as well..   [facility] consulted to admit.   Review of systems:    In addition to the HPI above,  No Fever-chills, but with fall, and left hip pain No Headache, No changes with Vision or hearing, No problems swallowing food or Liquids, No Chest pain, Cough or Shortness of Breath, No Abdominal pain, No Nausea or Vommitting, Bowel movements are regular, No Blood in stool or Urine, No dysuria, No new skin rashes or bruises, No new weakness, tingling,  numbness in any extremity, No recent weight gain or loss, No polyuria, polydypsia or polyphagia, No significant Mental Stressors.  A full 10 point Review of Systems was done, except as stated above, all other Review of Systems were negative.   With Past History of the following :    Past Medical History:  Diagnosis Date  . Anxiety   . Diabetes mellitus with neuropathy ([place])   . Diabetic neuropathy ([place])   . DJD (degenerative joint disease)   . H/O aortic valve replacement   . Hyperlipidemia   . Hypertension   . Neuromuscular disorder (HCC)    neuropathy in feet  . Obesity   . Sleep apnea   . Stroke [facility])    " light stroke"  . Wears glasses       Past Surgical History:  Procedure Laterality Date  . ANKLE SURGERY     Left tendon repair  . AORTIC VALVE REPLACEMENT  03/1994  . CARDIAC CATHETERIZATION     1996 [facility]  . CERVICAL SPINE SURGERY    .  COLONOSCOPY    . HEMORRHOID SURGERY    . IR PARACENTESIS  10/31/2019  . KNEE ARTHROSCOPY Right 04/13/2015   Procedure: RIGHT KNEE ARTHROSCOPY WITH DEBRIDEMENT AND PARTIAL MEDIAL MENISCECTOMY;  Surgeon: Mcarthur Rossetti, MD;  Location: [place];  Service: Orthopedics;  Laterality: Right;  . KNEE ARTHROSCOPY W/ MENISCECTOMY Right 04/13/2015  . LUMBAR FUSION    . RIGHT HEART CATH N/A 10/28/2019   Procedure: RIGHT HEART CATH;  Surgeon: Leonie Man, MD;  Location: [place] [facility];  Service: Cardiovascular;  Laterality: N/A;      Social History:     Social History   Tobacco Use  . Smoking status: Never Smoker  . Smokeless tobacco: Never Used  Substance Use Topics  . Alcohol use: No    Alcohol/week: 0.0 standard drinks       Family History :     Family History  Problem Relation Age of Onset  . Pneumonia Sister   . Dementia Mother   . Diabetes Sister       Home Medications:   Prior to Admission medications   Medication Sig Start Date End Date Taking? Authorizing Provider  acetaminophen (TYLENOL)  500 MG tablet Take 500 mg by mouth 2 (two) times daily as needed for mild pain or moderate pain.    Yes [provider]  bisacodyl (DULCOLAX) 5 MG EC tablet Take 5 mg by mouth daily as needed for moderate constipation.   Yes [provider]  buPROPion (WELLBUTRIN SR) 150 MG 12 hr tablet Take 150 mg by mouth daily. 01/07/20  Yes [provider]  busPIRone (BUSPAR) 5 MG tablet Take 5 mg by mouth 2 (two) times daily.   Yes [provider]  cetirizine (ZYRTEC) 10 MG tablet Take 10 mg by mouth at bedtime as needed for allergies.  02/16/15  Yes [provider]  fluticasone (FLONASE) 50 MCG/ACT nasal spray Place 1 spray into both nostrils as needed.  01/13/15  Yes [provider]  HYDROcodone-acetaminophen (NORCO) 10-325 MG tablet Take 1 tablet by mouth 2 (two) times daily. 07/28/20  Yes [provider]  lactulose (CHRONULAC) 10 GM/15ML solution Take 30 mLs (20 g total) by mouth daily. 11/03/19  Yes Donne Hazel, MD  pantoprazole (PROTONIX) 40 MG tablet Take 1 tablet (40 mg total) by mouth daily before breakfast. 03/21/16  Yes Rehman, Mechele Dawley, MD  torsemide (DEMADEX) 20 MG tablet Take 20 mg by mouth daily.   Yes [provider]  warfarin (COUMADIN) 2.5 MG tablet Take 1 tablet (2.5 mg total) by mouth daily. 02/10/20  Yes Branch, Alphonse Guild, MD  ALPRAZolam Duanne Moron) 0.5 MG tablet Take 1 tablet (0.5 mg total) by mouth 3 (three) times daily as needed for anxiety. 01/17/20   Kathie Dike, MD  QUEtiapine (SEROQUEL) 25 MG tablet Take 1 tablet (25 mg total) by mouth at bedtime. Patient not taking: No sig reported 01/17/20   Kathie Dike, MD  simvastatin (ZOCOR) 40 MG tablet Take 40 mg by mouth daily at 8 pm.  Patient not taking: No sig reported 10/16/12   [provider]  spironolactone (ALDACTONE) 100 MG tablet Take 100 mg by mouth daily. 02/05/20   [provider]  vitamin B-12 (CYANOCOBALAMIN) 500 MCG tablet Take 500  mcg by mouth daily. Patient not taking: No sig reported    [provider]  Vitamin D, Ergocalciferol, (DRISDOL) 1.25 MG (50000 UT) CAPS capsule Take 50,000 Units by mouth every Sunday.  Patient not taking: No  sig reported    [provider]     Allergies:     Allergies  Allergen Reactions  . Tape Itching    Redness, Please use "paper" tape only     Physical Exam:   Vitals  Blood pressure (!) 142/65, pulse 93, temperature 98.2 F (36.8 C), temperature source Oral, resp. rate 18, height '5\' 7"'$  (1.702 m), weight 52.2 kg, SpO2 99 %.   1. General frail elderly female laying in bed in NAD  2. Normal affect and insight, Not Suicidal or Homicidal, Awake Alert, Oriented X 3.  3. No F.N deficits, ALL C.Nerves Intact, Strength 5/5 all 4 extremities, Sensation intact all 4 extremities, Plantars down going.  4. Ears and Eyes appear Normal, Conjunctivae clear, PERRLA. Moist Oral Mucosa.  5. Supple Neck, No JVD, No cervical lymphadenopathy appriciated, No Carotid Bruits.  6. Symmetrical Chest wall movement, Good air movement bilaterally, CTAB.  7.  irregular irregular, mechanical valve click, plus systolic murmur  8. Positive Bowel Sounds, Abdomen Soft, No tenderness, No organomegaly appriciated,No rebound -guarding or rigidity.  9.  No Cyanosis, Normal Skin Turgor, No Skin Rash or Bruise.  10. Good muscle tone, tenderness to palpation in left hip and ankle area  11. No Palpable Lymph Nodes in Neck or Axillae    Data Review:    CBC Recent Labs  Lab 08/11/20 1509  WBC 9.3  HGB 11.8*  HCT 38.4  PLT 176  MCV 110.0*  MCH 33.8  MCHC 30.7  RDW 14.6   ------------------------------------------------------------------------------------------------------------------  Chemistries  Recent Labs  Lab 08/11/20 1509  NA 135  K 3.8  CL 100  CO2 25  GLUCOSE 100*  BUN 24*  CREATININE 1.31*  CALCIUM 9.7  AST 28  ALT 16  ALKPHOS 109  BILITOT 1.2    ------------------------------------------------------------------------------------------------------------------ estimated creatinine clearance is 27.3 mL/min (A) (by C-G formula based on SCr of 1.31 mg/dL (H)). ------------------------------------------------------------------------------------------------------------------ No results for input(s): TSH, T4TOTAL, T3FREE, THYROIDAB in the last 72 hours.  Invalid input(s): FREET3  Coagulation profile Recent Labs  Lab 08/11/20 1509  INR 2.2*   ------------------------------------------------------------------------------------------------------------------- No results for input(s): DDIMER in the last 72 hours. -------------------------------------------------------------------------------------------------------------------  Cardiac Enzymes No results for input(s): CKMB, TROPONINI, MYOGLOBIN in the last 168 hours.  Invalid input(s): CK ------------------------------------------------------------------------------------------------------------------    Component Value Date/Time   BNP 193.0 (H) 01/15/2020 1325     ---------------------------------------------------------------------------------------------------------------  Urinalysis    Component Value Date/Time   COLORURINE STRAW (A) 08/11/2020 1221   APPEARANCEUR CLEAR 08/11/2020 1221   LABSPEC 1.005 08/11/2020 1221   PHURINE 6.0 08/11/2020 1221   GLUCOSEU NEGATIVE 08/11/2020 1221   [place] 08/11/2020 1221   [place] 08/11/2020 1221   [place] 08/11/2020 1221   PROTEINUR NEGATIVE 08/11/2020 1221   UROBILINOGEN 2.0 (H) 11/01/2007 1518   NITRITE NEGATIVE 08/11/2020 1221   LEUKOCYTESUR NEGATIVE 08/11/2020 1221    ----------------------------------------------------------------------------------------------------------------   Imaging Results:    DG Ankle Complete Left  Result Date: 08/11/2020 CLINICAL DATA:  Recent fall with left  ankle pain, initial encounter EXAM: LEFT ANKLE COMPLETE - 3+ VIEW COMPARISON:  01/04/2020 FINDINGS: Mild soft tissue swelling is noted. There is a lucency noted posteriorly suggestive of posterior malleolar fracture without significant displacement. No other fracture is seen. Well corticated bony densities are noted adjacent to the medial malleolus consistent with prior injury. This is stable from the prior exam. No other focal abnormality is noted. IMPRESSION: Changes suggestive of posterior malleolar fracture without significant displacement. Electronically Signed  By: Inez Catalina M.D.   On: 08/11/2020 14:12   CT Head Wo Contrast  Result Date: 08/11/2020 CLINICAL DATA:  Fall with trauma to the head. EXAM: CT HEAD WITHOUT CONTRAST TECHNIQUE: Contiguous axial images were obtained from the base of the skull through the vertex without intravenous contrast. COMPARISON:  02/18/2020 FINDINGS: Brain: Age related volume loss. Mild chronic small-vessel ischemic change of the pons and cerebral hemispheric white matter. No sign of acute infarction, mass lesion, hemorrhage, hydrocephalus or extra-axial collection. Vascular: There is atherosclerotic calcification of the major vessels at the base of the brain. Skull: No skull fracture. Sinuses/Orbits: Previous functional endoscopic sinus surgery on the right. Chronic inflammatory changes of the right maxillary sinus. Orbits negative. Other: None IMPRESSION: No acute intracranial finding. Atrophy and chronic small-vessel ischemic changes. Chronic inflammatory changes of the right maxillary sinus. Previous functional endoscopic sinus surgery. Electronically Signed   By: Nelson Chimes M.D.   On: 08/11/2020 14:08   CT Cervical Spine Wo Contrast  Result Date: 08/11/2020 CLINICAL DATA:  Poly trauma.  Fall. EXAM: CT CERVICAL SPINE WITHOUT CONTRAST TECHNIQUE: Multidetector CT imaging of the cervical spine was performed without intravenous contrast. Multiplanar CT image  reconstructions were also generated. COMPARISON:  January 15, 2020. FINDINGS: Alignment: Similar alignment to the prior. Similar mild straightening without substantial sagittal subluxation. Mild widening of the C4-C5 disc space anteriorly is unchanged from the prior. Mild broad dextrocurvature. Skull base and vertebrae: Similar C5-C7 solid osseous fusion. No evidence of acute fracture. Vertebral body heights are maintained. Soft tissues and spinal canal: No prevertebral fluid or swelling. No visible canal hematoma. Disc levels: Posterior disc bulges at multiple mid upper cervical levels. At C3-C4 there is disc bulge and ligamentum flavum thickening with possibly moderate canal stenosis. Multilevel facet hypertrophy, greatest on the left at C4-C5. Upper chest: Visualized lung apices are clear. Other: Calcific atherosclerosis of the carotid arteries. IMPRESSION: 1. No evidence of acute fracture or traumatic malalignment. 2. Multiple posterior disc bulges with possibly moderate canal stenosis at C3-C4. An MRI could better evaluate the canal/cord/foramina if clinically indicated. 3. Similar osseous fusion from C5-C7. Electronically Signed   By: Margaretha Sheffield MD   On: 08/11/2020 13:52   DG Chest Port 1 View  Result Date: 08/11/2020 CLINICAL DATA:  Fall, left hip pain EXAM: PORTABLE CHEST 1 VIEW COMPARISON:  02/18/2020 FINDINGS: Small right pleural effusion with associated pleural thickening, chronic. Right lower lobe scarring/atelectasis. Left lung is clear.  No pneumothorax. Mild cardiomegaly.  Thoracic aortic atherosclerosis. Median sternotomy. IMPRESSION: Small right pleural effusion with associated lower lobe scarring and pleural thickening, chronic. Electronically Signed   By: Julian Hy M.D.   On: 08/11/2020 14:06   DG Knee Complete 4 Views Left  Result Date: 08/11/2020 CLINICAL DATA:  Fall with pain. EXAM: LEFT KNEE - COMPLETE 4+ VIEW COMPARISON:  None. FINDINGS: No evidence of fracture or  dislocation. No joint effusion. Regional arterial calcification incidentally noted. IMPRESSION: No acute finding. Electronically Signed   By: Nelson Chimes M.D.   On: 08/11/2020 14:06   DG Hip Unilat W or Wo Pelvis 2-3 Views Left  Result Date: 08/11/2020 CLINICAL DATA:  Fall with left-sided pain. EXAM: DG HIP (WITH OR WITHOUT PELVIS) 2-3V LEFT COMPARISON:  None. FINDINGS: Angulated femoral neck fracture on the left. No intertrochanteric component. Pelvic bones appear intact. IMPRESSION: Angulated fracture of the left femoral neck. Electronically Signed   By: Nelson Chimes M.D.   On: 08/11/2020 14:08    My personal  review of EKG: Rhythm NSR, Rate  107/QTC of 531 old left bundle branch block   Assessment & Plan:    Active Problems:   Hyperlipidemia   Essential hypertension   S/P aortic valve replacement   Fall   Persistent atrial fibrillation (HCC)   CKD (chronic kidney disease), stage III (HCC)   Anticoagulant long-term use   Hip fracture (HCC)  Left hip fracture/posterior malleolar fracture -Due to mechanical fall, she sustained both hip and ankle fracture -Admitted under fracture pathway, continue with as needed pain medication. -Discussed with orthopedic , apparently anesthesia cannot clear her for surgery at [facility] due to her history of severe Tricuspid regurgitation, orthopedic discussed with Dr.XU in [name] he will see patient. -She is on warfarin for medical heart valve, INR is 2.2, will hold warfarin, she will be bridged with heparin gtt. in anticipation for surgery, see discussion below under chronic anticoagulation.   status post aortic valve replacement with [facility] AVR [phone]  -Is on warfarin, subtherapeutic INR at 2.2, she will be started on heparin drip specially her warfarin will be held in anticipation for surgery . -Surgery date is not clear yet, so we will hold on giving vitamin K or FFP, and will let her INR level trend down without  intervention.  HFpEF/patient with history of combined systolic and diastolic Dysfunction CHF Exac/history of severe tricuspid regurgitation/severe pulmonary hypertension -  in theSetting ofmildpulmonary HTN-patient underwent RHC on 10/28/2019 with  pulmonary hypertension -Echo from August 2021 with EF of 55 to 60% - will call cardiology for clearance regarding her surgery  OSA---history of noncompliance with CPAP  history of paroxysmal A. fib and prior CVA -on warfarin -Metoprolol for rate control -Currently INR supratherapeutic at 4.9, Coumadin currently on hold  chronic anemia -Hgbcurrently stable   DM2 -check A1c, will keep on CBG   DVT Prophylaxis warfarin/heparin GTT AM Labs Ordered, also please review Full Orders  Family Communication: Admission, patients condition and plan of care including tests being ordered have been discussed with the patient and husband at bedside who indicate understanding and agree with the plan and Code Status.  Code Status full code  Likely DC to SNF  Condition GUARDED    Consults called: Orthopedic Dr. Erlinda Hong called by Dr. Federico Flake at [facility])  Admission status: inpatient    Time spent in minutes : 65 minutes   Phillips Climes M.D on 08/11/2020 at [place] PM   [facility] - Office  [phone]

## 2020-08-11 NOTE — Progress Notes (Signed)
ANTICOAGULATION CONSULT NOTE - Initial Consult  Pharmacy Consult for heparin gtt  Indication: mechanical valve INR 2.2 surgery tomorrow  Allergies  Allergen Reactions  . Tape Itching    Redness, Please use "paper" tape only    Patient Measurements: Height: '5\' 7"'$  (170.2 cm) Weight: 52.2 kg (115 lb) IBW/kg (Calculated) : 61.6 Heparin Dosing Weight: HEPARIN DW (KG): 52.2   Vital Signs: Temp: 98.2 F (36.8 C) (05/25 1122) Temp Source: Oral (05/25 1122) BP: 150/71 (05/25 1500) Pulse Rate: 98 (05/25 1500)  Labs: Recent Labs    08/11/20 1509  HGB 11.8*  HCT 38.4  PLT 176  LABPROT 24.2*  INR 2.2*  CREATININE 1.31*    Estimated Creatinine Clearance: 27.3 mL/min (A) (by C-G formula based on SCr of 1.31 mg/dL (H)).   Medical History: Past Medical History:  Diagnosis Date  . Anxiety   . Diabetes mellitus with neuropathy ([place])   . Diabetic neuropathy ([place])   . DJD (degenerative joint disease)   . H/O aortic valve replacement   . Hyperlipidemia   . Hypertension   . Neuromuscular disorder (HCC)    neuropathy in feet  . Obesity   . Sleep apnea   . Stroke [facility])    " light stroke"  . Wears glasses     Medications:  (Not in a hospital admission)  Scheduled:   Infusions:  . heparin 700 Units/hr (08/11/20 1644)   PRN:   Assessment: Mary [name] a 80 y.o. female requires anticoagulation with a heparin iv infusion for the indication of  mechanical valve. Heparin gtt will be started following pharmacy protocol per pharmacy consult. Patient is not on previous oral anticoagulant that will require aPTT/HL correlation before transitioning to only HL monitoring.   No bolus per physician, just bridging patient overnight until surgery tomorrow. Conservative heparin per MD.   Goal of Therapy:  Heparin level 0.3-0.7 units/ml Monitor platelets by anticoagulation protocol: Yes   Plan:  Start heparin infusion at 700 units/hr Check anti-Xa level in 8 hours and daily while on  heparin Continue to monitor H&H and platelets  Heparin level to be drawn in 8 hours for patients >69 years old or crcl < 24m/min  GDonna ChristenCoffee 08/11/2020,4:38 PM

## 2020-08-11 NOTE — Consult Note (Signed)
Cardiology Consult    Patient ID: Mary [name] MRN: [id number], DOB/AGE: [birth year]   Admit date: 08/11/2020 Date of Consult: 08/11/2020 Requesting Provider: Albertine Patricia, MD  PCP:  Bonnita Nasuti, MD   [facility] Providers Cardiologist:  Carlyle Dolly, MD   }    Patient Profile    Mary [name] is a frail 80 y.o. 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 She is being seen today for pre-operative cardiac evaluation for hip surgery.   History of Present Illness    Coming from independent living facility, brought in earlier today for injuries related to a ground-level mechanical fall after tripping over vacuum cord, fell on left side and also hit her head. Initially taken to [facility] where she was found to have fractures of left femoral neck and ankle fracture. INR was 2.2, Cr 1.3 (at baseline). Due to history of severe TR, she was transferred to [facility] for pre-operative cardiac evaluation. Warfarin is being held and she has been started on UFH bridge.   Regarding her cardiac history, she has a remote history of mechanical AVR in 1995 with SJM valve, though specific valve type unknown. She has not had any known issues with the valve, though INRs seem to historically be somewhat labile. She began having issues with volume around 2017, mostly in the form of pleural effusions and ascites. Echo from that time (10/2015, personally reviewed) showed no more than mild RV dilation with similarly borderline systolic function, moderate TR, borderline PH (RVSP 38) with estimated RAP of 8, and moderate biatrial enlargement. LVEF was preserved and there was mild AI without evidence of significant prosthetic valve dysfunction. She was  also seen by GI Jan 2018 for possible cirrhosis. Hepatic elastography was consistent with F2/F3 fibrosis and cirrhotic morphology, overall felt to be consistent with cirrhosis. I don't see that she has had any interval GI follow-up since then. She has been managed with diuretics as well as thoracentesis/paracentesis. She was then diagnosed with AF on ambulatory monitoring in Jan 2020 for work-up of palpitations. She been treated with rate control strategy alone since then. An echo the following month showed new severe RV dilation with reduced systolic function, severe TR, and severe PH with high RAP. She underwent RHC August 2021 for further work-up - per my review this showed moderate isolated post-capillary PH with a mean PAP of 37 and PVR of 1.6. RAP was elevated at 12 with v waves to 18, though PAPi and cardiac output were normal.   She was last seen by cardiology 12/2019, at which time she was suffering from recurrent falls and general functional decline, noted to be cachectic and slightly confused at that visit. Still had not had any GI follow-up for cirrhosis despite need for continued paracentesis and it appears she has since been lost to follow-up with cardiology as well. At that time her diuretics had been continued.   Currently she reports stable exertional dyspnea. She doesn't really ever push herself too much but she does not think she could walk up a flight of stairs at baseline. Denies chest pain, syncope, or exertional dizziness. She has some degree of mild orthopnea but this is also stable. She has mild leg swelling, also at baseline. She has  actually been losing weight. She is on stable diuretics, administered by her facility.   INR 2.2 on admission. Current platelet count is 176, albumin 4.0, bilirubin 1.2, sodium 135, Cr 1.3 (GFR 41). MELD 20 (10 assuming baseline INR of 1.0).    Past Medical History   Past Medical History:  Diagnosis Date  . Anxiety   . Diabetes mellitus with  neuropathy ([place])   . Diabetic neuropathy ([place])   . DJD (degenerative joint disease)   . H/O aortic valve replacement   . Hyperlipidemia   . Hypertension   . Neuromuscular disorder (H[facility])    neuropathy in feet  . Obesity   . Sleep apnea   . Stroke [facility])    " light stroke"  . Wears glasses     Past Surgical History:  Procedure Laterality Date  . ANKLE SURGERY     Left tendon repair  . AORTIC VALVE REPLACEMENT  03/1994  . CARDIAC CATHETERIZATION     1996 [facility]  . CERVICAL SPINE SURGERY    . COLONOSCOPY    . HEMORRHOID SURGERY    . IR PARACENTESIS  10/31/2019  . KNEE ARTHROSCOPY Right 04/13/2015   Procedure: RIGHT KNEE ARTHROSCOPY WITH DEBRIDEMENT AND PARTIAL MEDIAL MENISCECTOMY;  Surgeon: Mcarthur Rossetti, MD;  Location: [place];  Service: Orthopedics;  Laterality: Right;  . KNEE ARTHROSCOPY W/ MENISCECTOMY Right 04/13/2015  . LUMBAR FUSION    . RIGHT HEART CATH N/A 10/28/2019   Procedure: RIGHT HEART CATH;  Surgeon: Leonie Man, MD;  Location: [place] [facility];  Service: Cardiovascular;  Laterality: N/A;     Allergies  Allergen Reactions  . Tape Itching    Redness, Please use "paper" tape only   Inpatient Medications    . [START ON 08/12/2020] buPROPion  150 mg Oral Daily  . busPIRone  5 mg Oral BID  . docusate sodium  100 mg Oral BID  . [START ON 08/12/2020] lactulose  20 g Oral Daily  . [START ON 08/12/2020] pantoprazole  40 mg Oral QAC breakfast  . [START ON 08/12/2020] torsemide  20 mg Oral Daily    Family History    Family History  Problem Relation Age of Onset  . Pneumonia Sister   . Dementia Mother   . Diabetes Sister    She indicated that her mother is deceased. She indicated that her father is deceased. She indicated that four of her five sisters are alive.   Social History    Social History   Socioeconomic History  . Marital status: Married    Spouse name: Not on file  . Number of children: Not on file  . Years of education: Not on  file  . Highest education level: Not on file  Occupational History  . Occupation: Disabled    Employer: RETIRED  Tobacco Use  . Smoking status: Never Smoker  . Smokeless tobacco: Never Used  Vaping Use  . Vaping Use: Never used  Substance and Sexual Activity  . Alcohol use: No    Alcohol/week: 0.0 standard drinks  . Drug use: No  . Sexual activity: Not on file  Other Topics Concern  . Not on file  Social History Narrative   Widowed   No regular exercise   2 children   Social Determinants of Health   Financial Resource Strain: Not on file  Food Insecurity: Not on file  Transportation Needs: Not on file  Physical Activity: Not on file  Stress: Not on file  Social  Connections: Not on file  Intimate Partner Violence: Not on file     Review of Systems    A comprehensive review of systems was performed with pertinent positives and negatives noted in the HPI  Physical Exam    Blood pressure (!) 118/48, pulse 95, temperature 97.8 F (36.6 C), temperature source Oral, resp. rate 18, height _0  (1.702 m), weight 52.2 kg, SpO2 99 %.    No intake or output data in the 24 hours ending 08/11/20 2035 Wt Readings from Last 3 Encounters:  08/11/20 52.2 kg  02/18/20 52.1 kg  01/17/20 52.1 kg    CONSTITUTIONAL: alert and conversant, frail appearing elderly woman, underweight with evidence of mild cachexia, in no distress HEENT: evidence of cachexia, otherwise grossly normal NECK: JVP approximately 8-10cm with large V waves to mandible, no masses CARDIAC: Irregular rhythm. Crisp closing mechanical valve click without evidence of significant regurgitation. Holosystolic murmur most prominent at LUSB. No friction rub. VASCULAR: Radial pulses 1+ bilaterally.  PULMONARY/CHEST WALL: no deformities, diminished breath sounds bilaterally without wheezing/crackles/ronchi, normal work of breathing ABDOMINAL: soft, non-tender, non-distended EXTREMITIES: trace edema, + muscle atrophy, warm  and well-perfused SKIN: Dry and intact without apparent rashes or wounds. Scattered ecchymoses. No peripheral cyanosis. NEUROLOGIC: alert, no abnormal movements, cranial nerves grossly intact. PSYCH: normal affect, normal speech and language   Labs    Lab Results  Component Value Date   WBC 9.3 08/11/2020   HGB 11.8 (L) 08/11/2020   HCT 38.4 08/11/2020   MCV 110.0 (H) 08/11/2020   PLT 176 08/11/2020    Recent Labs  Lab 08/11/20 1509  NA 135  K 3.8  CL 100  CO2 25  BUN 24*  CREATININE 1.31*  CALCIUM 9.7  PROT 9.0*  BILITOT 1.2  ALKPHOS 109  ALT 16  AST 28  GLUCOSE 100*   Lab Results  Component Value Date   CHOL 67 10/25/2019   HDL 28 (L) 10/25/2019   LDLCALC 27 10/25/2019   TRIG 61 10/25/2019   No results found for: DDIMER Recent Labs    09/29/19 1503 10/23/19 1103 01/15/20 1325  BNP 504.0* 565.0* 193.0*     Radiology Studies    DG Ankle Complete Left  Result Date: 08/11/2020 CLINICAL DATA:  Recent fall with left ankle pain, initial encounter EXAM: LEFT ANKLE COMPLETE - 3+ VIEW COMPARISON:  01/04/2020 FINDINGS: Mild soft tissue swelling is noted. There is a lucency noted posteriorly suggestive of posterior malleolar fracture without significant displacement. No other fracture is seen. Well corticated bony densities are noted adjacent to the medial malleolus consistent with prior injury. This is stable from the prior exam. No other focal abnormality is noted. IMPRESSION: Changes suggestive of posterior malleolar fracture without significant displacement. Electronically Signed   By: Inez Catalina M.D.   On: 08/11/2020 14:12   CT Head Wo Contrast  Result Date: 08/11/2020 CLINICAL DATA:  Fall with trauma to the head. EXAM: CT HEAD WITHOUT CONTRAST TECHNIQUE: Contiguous axial images were obtained from the base of the skull through the vertex without intravenous contrast. COMPARISON:  02/18/2020 FINDINGS: Brain: Age related volume loss. Mild chronic small-vessel  ischemic change of the pons and cerebral hemispheric white matter. No sign of acute infarction, mass lesion, hemorrhage, hydrocephalus or extra-axial collection. Vascular: There is atherosclerotic calcification of the major vessels at the base of the brain. Skull: No skull fracture. Sinuses/Orbits: Previous functional endoscopic sinus surgery on the right. Chronic inflammatory changes of the right maxillary sinus. Orbits negative. Other:  None IMPRESSION: No acute intracranial finding. Atrophy and chronic small-vessel ischemic changes. Chronic inflammatory changes of the right maxillary sinus. Previous functional endoscopic sinus surgery. Electronically Signed   By: Nelson Chimes M.D.   On: 08/11/2020 14:08   CT Cervical Spine Wo Contrast  Result Date: 08/11/2020 CLINICAL DATA:  Poly trauma.  Fall. EXAM: CT CERVICAL SPINE WITHOUT CONTRAST TECHNIQUE: Multidetector CT imaging of the cervical spine was performed without intravenous contrast. Multiplanar CT image reconstructions were also generated. COMPARISON:  January 15, 2020. FINDINGS: Alignment: Similar alignment to the prior. Similar mild straightening without substantial sagittal subluxation. Mild widening of the C4-C5 disc space anteriorly is unchanged from the prior. Mild broad dextrocurvature. Skull base and vertebrae: Similar C5-C7 solid osseous fusion. No evidence of acute fracture. Vertebral body heights are maintained. Soft tissues and spinal canal: No prevertebral fluid or swelling. No visible canal hematoma. Disc levels: Posterior disc bulges at multiple mid upper cervical levels. At C3-C4 there is disc bulge and ligamentum flavum thickening with possibly moderate canal stenosis. Multilevel facet hypertrophy, greatest on the left at C4-C5. Upper chest: Visualized lung apices are clear. Other: Calcific atherosclerosis of the carotid arteries. IMPRESSION: 1. No evidence of acute fracture or traumatic malalignment. 2. Multiple posterior disc bulges with  possibly moderate canal stenosis at C3-C4. An MRI could better evaluate the canal/cord/foramina if clinically indicated. 3. Similar osseous fusion from C5-C7. Electronically Signed   By: Margaretha Sheffield MD   On: 08/11/2020 13:52   DG Chest Port 1 View  Result Date: 08/11/2020 CLINICAL DATA:  Fall, left hip pain EXAM: PORTABLE CHEST 1 VIEW COMPARISON:  02/18/2020 FINDINGS: Small right pleural effusion with associated pleural thickening, chronic. Right lower lobe scarring/atelectasis. Left lung is clear.  No pneumothorax. Mild cardiomegaly.  Thoracic aortic atherosclerosis. Median sternotomy. IMPRESSION: Small right pleural effusion with associated lower lobe scarring and pleural thickening, chronic. Electronically Signed   By: Julian Hy M.D.   On: 08/11/2020 14:06   DG Knee Complete 4 Views Left  Result Date: 08/11/2020 CLINICAL DATA:  Fall with pain. EXAM: LEFT KNEE - COMPLETE 4+ VIEW COMPARISON:  None. FINDINGS: No evidence of fracture or dislocation. No joint effusion. Regional arterial calcification incidentally noted. IMPRESSION: No acute finding. Electronically Signed   By: Nelson Chimes M.D.   On: 08/11/2020 14:06   DG Hip Unilat W or Wo Pelvis 2-3 Views Left  Result Date: 08/11/2020 CLINICAL DATA:  Fall with left-sided pain. EXAM: DG HIP (WITH OR WITHOUT PELVIS) 2-3V LEFT COMPARISON:  None. FINDINGS: Angulated femoral neck fracture on the left. No intertrochanteric component. Pelvic bones appear intact. IMPRESSION: Angulated fracture of the left femoral neck. Electronically Signed   By: Nelson Chimes M.D.   On: 08/11/2020 14:08    ECG & Cardiac Imaging    ECG on admission shows atrial fibrillation (rate 91), LBBB, and right axis deviation. Similar to most recent tracings - personally reviewed.  Last TTE 10/23/2019: LVEF 55-60%, parodoxical/dyskinetic septal motion with systolic/diastolic septal flattening, severe RV dilation with at least moderately reduced systolic function, moderate  left atrial dilation, severe right atrial dilation, severe (if not torrential) TR, at least moderate PR, moderate MAC with appearance of a possible prior annuloplasty and mild-moderate MR, mechanical aortic valve without significant regurgitation or stenosis, severe PH, severely elevated RAP, no pericardial effusion   RHC 10/28/2019: RA mean 12 (v18), RV 65/5/12, PA 68/21/37, PCW mean 25 (v40), PA sat 75%, Ao 100%, CO 7.65, PVR 1.6   Assessment & Plan  1. WHO group 2 [place] hypertension, moderate-severe 2. 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 Severe tricuspid regurgitation  High operative risk due to both cardiac and non-cardiac comorbidities. Her RV filling pressures are high and there is evidence of severe TR on exam, but overall I think she is about as euvolemic and she would tolerate and seems to be at her baseline from heart failure/PH standpoint and is well perfused. By exam, her valve seems ok, but it would be reasonable to update her echo. Additionally, her RV failure and TR seem out of proportion to the degree of left heart disease - it is worth evaluating for shunt, which has not been done previously.   Pre-op testing: Update TTE, will include bubble study to rule out shunt given the degree of RV failure. Despite poor functional status (< 4 METS), do not recommend stress testing as this would not change management.  Perioperative recommendations: - High risk for intubation/GA. Strongly consider regional anesthesia.  - Judicious with fluids. Goal should be net even fluid balance perioperatively. Lasix 2m IV should be an adequate diuretic dose for her should she need it. - If she were to develop perioperative cardiogenic shock, recommend dobutamine for RV support. Avoid any pulmonary vasodilatory - her PVR is normal and wedge pressure was significantly elevated on prior right heart cath. - Monitor on telemetry - see AF recs below  4. Mechanical AVR in 1995  (SJM) - TTE to reassess valve function. No concerns on exam. - Agree with heparin bridge. Heparin should be restarted as soon as possible post-operatively and bridge back to warfarin until therapeutic with a goal INR of 3 - Note that due to concomitant cirrhosis, INR may not normalize while holding warfarin. Avoid vitamin K or FFP due to thrombotic risk.  5. Long-standing persistent atrial fibrillation - Goal rate < 120 perioperatively - Avoid IV CCB/BB for rate control. A single dose of digoxin 250 mcg IV instead for acute rate control, with concomitant initiation of oral metoprolol tartrate every 6 hours, start at 12.533mand uptitrate prn. Okay to use short term amiodarone infusion if blood pressure limits use of beta blocker.  - Anticoagulation as above   Noncardiac comorbidities, management per hospitalist: 6. Cirrhosis 7. Frailty 8. Untreated OSA 9. CKD 10. Type 2 diabetes with neuropathy   Signed, ScMarykay LexMD 08/11/2020, 8:35 PM  For questions or updates, please contact   Please consult [email or web address] for contact info under Cardiology/STEMI.

## 2020-08-11 NOTE — ED Triage Notes (Signed)
Patient states she tripped over a cord at [facility] in [place] and fell, hurting her entire left side and possibly hitting her head. Patient denies headache but states intense left hip pain as well has left arm and left knee.

## 2020-08-11 NOTE — ED Provider Notes (Signed)
[facility] EMERGENCY DEPARTMENT Provider Note   CSN: [id number] Arrival date & time: 08/11/20  1112     History Chief Complaint  Patient presents with  . Fall    Mary [name] is a 80 y.o. female with past medical history of HTN, HLD, DJD, diabetic neuropathy, and CVA on warfarin who presents the ED via EMS after sustaining mechanical fall at [facility].  On my examination, patient reports that she is at an independent living facility and she tripped over the cord while vacuuming her floor and fell onto her left side.  She states that she did hit the left side of her head and initially had a headache which has since resolved.  Her primary concern however is inability to lift her left leg.  She endorses pain symptoms spanning from her ankle to her hip.  She denies any blurred vision, dizziness, diplopia, chest pain, difficulty breathing, or other injuries.  HPI     Past Medical History:  Diagnosis Date  . Anxiety   . Diabetes mellitus with neuropathy ([place])   . Diabetic neuropathy ([place])   . DJD (degenerative joint disease)   . H/O aortic valve replacement   . Hyperlipidemia   . Hypertension   . Neuromuscular disorder (HCC)    neuropathy in feet  . Obesity   . Sleep apnea   . Stroke [facility])    " light stroke"  . Wears glasses     Patient Active Problem List   Diagnosis Date Noted  . Agitation 02/21/2020  . Confusion 02/21/2020  . Hyperkalemia 01/15/2020  . 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 Anticoagulant long-term use 01/15/2020  . Other secondary pulmonary hypertension ([place]) 10/28/2019  . Acute on chronic combined systolic and diastolic CHF (congestive heart failure) ([place])   . Anasarca 04/25/2019  . Hypoalbuminemia 04/25/2019  . Persistent atrial fibrillation ([place]) 04/25/2019  . CKD (chronic kidney disease), stage III ([place]) 04/25/2019  . Anemia due to chronic kidney disease 04/25/2019  . Pressure injury of skin 04/24/2019  . Fall 04/23/2019  . S/P  right knee arthroscopy 04/13/2015  . OSA (obstructive sleep apnea) 01/01/2013  . Diabetes ([place]) 10/23/2012  . Hyperlipidemia 09/29/2008  . Obesity 09/29/2008  . Essential hypertension 09/29/2008  . DEGENERATIVE JOINT DISEASE 09/29/2008    Past Surgical History:  Procedure Laterality Date  . ANKLE SURGERY     Left tendon repair  . AORTIC VALVE REPLACEMENT  03/1994  . CARDIAC CATHETERIZATION     1996 [facility]  . CERVICAL SPINE SURGERY    . COLONOSCOPY    . HEMORRHOID SURGERY    . IR PARACENTESIS  10/31/2019  . KNEE ARTHROSCOPY Right 04/13/2015   Procedure: RIGHT KNEE ARTHROSCOPY WITH DEBRIDEMENT AND PARTIAL MEDIAL MENISCECTOMY;  Surgeon: Mcarthur Rossetti, MD;  Location: [place];  Service: Orthopedics;  Laterality: Right;  . KNEE ARTHROSCOPY W/ MENISCECTOMY Right 04/13/2015  . LUMBAR FUSION    . RIGHT HEART CATH N/A 10/28/2019   Procedure: RIGHT HEART CATH;  Surgeon: Leonie Man, MD;  Location: [place] [facility];  Service: Cardiovascular;  Laterality: N/A;     OB History   No obstetric history on file.     Family History  Problem Relation Age of Onset  . Pneumonia Sister   . Dementia Mother   . Diabetes Sister     Social History   Tobacco Use  . Smoking status: Never Smoker  . Smokeless tobacco: Never Used  Vaping Use  .  Vaping Use: Never used  Substance Use Topics  . Alcohol use: No    Alcohol/week: 0.0 standard drinks  . Drug use: No    Home Medications Prior to Admission medications   Medication Sig Start Date End Date Taking? Authorizing Provider  acetaminophen (TYLENOL) 500 MG tablet Take 500 mg by mouth 2 (two) times daily as needed for mild pain or moderate pain.    Yes [provider]  bisacodyl (DULCOLAX) 5 MG EC tablet Take 5 mg by mouth daily as needed for moderate constipation.   Yes [provider]  buPROPion (WELLBUTRIN SR) 150 MG 12 hr tablet Take 150 mg by mouth daily. 01/07/20  Yes [provider]  busPIRone  (BUSPAR) 5 MG tablet Take 5 mg by mouth 2 (two) times daily.   Yes [provider]  cetirizine (ZYRTEC) 10 MG tablet Take 10 mg by mouth at bedtime as needed for allergies.  02/16/15  Yes [provider]  fluticasone (FLONASE) 50 MCG/ACT nasal spray Place 1 spray into both nostrils as needed.  01/13/15  Yes [provider]  HYDROcodone-acetaminophen (NORCO) 10-325 MG tablet Take 1 tablet by mouth 2 (two) times daily. 07/28/20  Yes [provider]  lactulose (CHRONULAC) 10 GM/15ML solution Take 30 mLs (20 g total) by mouth daily. 11/03/19  Yes Donne Hazel, MD  pantoprazole (PROTONIX) 40 MG tablet Take 1 tablet (40 mg total) by mouth daily before breakfast. 03/21/16  Yes Rehman, Mechele Dawley, MD  torsemide (DEMADEX) 20 MG tablet Take 20 mg by mouth daily.   Yes [provider]  warfarin (COUMADIN) 2.5 MG tablet Take 1 tablet (2.5 mg total) by mouth daily. 02/10/20  Yes Branch, Alphonse Guild, MD  ALPRAZolam Duanne Moron) 0.5 MG tablet Take 1 tablet (0.5 mg total) by mouth 3 (three) times daily as needed for anxiety. 01/17/20   Kathie Dike, MD  QUEtiapine (SEROQUEL) 25 MG tablet Take 1 tablet (25 mg total) by mouth at bedtime. Patient not taking: No sig reported 01/17/20   Kathie Dike, MD  simvastatin (ZOCOR) 40 MG tablet Take 40 mg by mouth daily at 8 pm.  Patient not taking: No sig reported 10/16/12   [provider]  spironolactone (ALDACTONE) 100 MG tablet Take 100 mg by mouth daily. 02/05/20   [provider]  vitamin B-12 (CYANOCOBALAMIN) 500 MCG tablet Take 500 mcg by mouth daily. Patient not taking: No sig reported    [provider]  Vitamin D, Ergocalciferol, (DRISDOL) 1.25 MG (50000 UT) CAPS capsule Take 50,000 Units by mouth every Sunday.  Patient not taking: No sig reported    [provider]    Allergies    Tape  Review of Systems   Review of Systems  All other systems reviewed and are  negative.   Physical Exam Updated Vital Signs BP (!) 150/71   Pulse 98   Temp 98.2 F (36.8 C) (Oral)   Resp (!) 26   Ht '5\' 7"'$  (1.702 m)   Wt 52.2 kg   SpO2 99%   BMI 18.01 kg/m   Physical Exam Vitals and nursing note reviewed. Exam conducted with a chaperone present.  Constitutional:      Appearance: Normal appearance.  HENT:     Head: Normocephalic.     Comments: Very small hematoma appreciated over left frontal region.  No significant ecchymoses.  No palpable skull defects. Eyes:     General: No scleral icterus.    Extraocular Movements: Extraocular movements intact.  Conjunctiva/sclera: Conjunctivae normal.     Pupils: Pupils are equal, round, and reactive to light.  Neck:     Comments: ROM intact.  No midline tenderness. Cardiovascular:     Rate and Rhythm: Normal rate.     Pulses: Normal pulses.  Pulmonary:     Effort: Pulmonary effort is normal. No respiratory distress.     Breath sounds: Normal breath sounds.     Comments: Breath sounds intact bilaterally. Chest:     Chest wall: No tenderness.  Abdominal:     General: Abdomen is flat. There is no distension.     Palpations: Abdomen is soft.     Tenderness: There is no abdominal tenderness.  Musculoskeletal:        General: Swelling, tenderness and signs of injury present.     Cervical back: Normal range of motion and neck supple. No tenderness.     Comments: Left leg: TTP over femur, predominantly over proximal aspect.  Associated swelling.  Compartments are soft.  Abrasion over left knee.  Reluctant to move.  No significant tibial tenderness.  TTP left ankle, pain with dorsiflexion or plantarflexion.  Pedal pulse intact and symmetric contralateral foot.  Sensation grossly intact. No significant midline spinal tenderness to palpation.  Skin:    General: Skin is dry.  Neurological:     Mental Status: She is alert.     GCS: GCS eye subscore is 4. GCS verbal subscore is 5. GCS motor subscore is 6.   Psychiatric:        Mood and Affect: Mood normal.        Behavior: Behavior normal.        Thought Content: Thought content normal.     ED Results / Procedures / Treatments   Labs (all labs ordered are listed, but only abnormal results are displayed) Labs Reviewed  COMPREHENSIVE METABOLIC PANEL - Abnormal; Notable for the following components:      Result Value   Glucose, Bld 100 (*)    BUN 24 (*)    Creatinine, Ser 1.31 (*)    Total Protein 9.0 (*)    GFR, Estimated 41 (*)    All other components within normal limits  CBC - Abnormal; Notable for the following components:   RBC 3.49 (*)    Hemoglobin 11.8 (*)    MCV 110.0 (*)    All other components within normal limits  URINALYSIS, ROUTINE W REFLEX MICROSCOPIC - Abnormal; Notable for the following components:   Color, Urine STRAW (*)    All other components within normal limits  RESP PANEL BY RT-PCR (FLU A&B, COVID) ARPGX2  LACTIC ACID, PLASMA  PROTIME-INR  SAMPLE TO BLOOD BANK    EKG None  Radiology DG Ankle Complete Left  Result Date: 08/11/2020 CLINICAL DATA:  Recent fall with left ankle pain, initial encounter EXAM: LEFT ANKLE COMPLETE - 3+ VIEW COMPARISON:  01/04/2020 FINDINGS: Mild soft tissue swelling is noted. There is a lucency noted posteriorly suggestive of posterior malleolar fracture without significant displacement. No other fracture is seen. Well corticated bony densities are noted adjacent to the medial malleolus consistent with prior injury. This is stable from the prior exam. No other focal abnormality is noted. IMPRESSION: Changes suggestive of posterior malleolar fracture without significant displacement. Electronically Signed   By: Inez Catalina M.D.   On: 08/11/2020 14:12   CT Head Wo Contrast  Result Date: 08/11/2020 CLINICAL DATA:  Fall with trauma to the head. EXAM: CT HEAD WITHOUT CONTRAST TECHNIQUE: Contiguous  axial images were obtained from the base of the skull through the vertex without  intravenous contrast. COMPARISON:  02/18/2020 FINDINGS: Brain: Age related volume loss. Mild chronic small-vessel ischemic change of the pons and cerebral hemispheric white matter. No sign of acute infarction, mass lesion, hemorrhage, hydrocephalus or extra-axial collection. Vascular: There is atherosclerotic calcification of the major vessels at the base of the brain. Skull: No skull fracture. Sinuses/Orbits: Previous functional endoscopic sinus surgery on the right. Chronic inflammatory changes of the right maxillary sinus. Orbits negative. Other: None IMPRESSION: No acute intracranial finding. Atrophy and chronic small-vessel ischemic changes. Chronic inflammatory changes of the right maxillary sinus. Previous functional endoscopic sinus surgery. Electronically Signed   By: Nelson Chimes M.D.   On: 08/11/2020 14:08   CT Cervical Spine Wo Contrast  Result Date: 08/11/2020 CLINICAL DATA:  Poly trauma.  Fall. EXAM: CT CERVICAL SPINE WITHOUT CONTRAST TECHNIQUE: Multidetector CT imaging of the cervical spine was performed without intravenous contrast. Multiplanar CT image reconstructions were also generated. COMPARISON:  January 15, 2020. FINDINGS: Alignment: Similar alignment to the prior. Similar mild straightening without substantial sagittal subluxation. Mild widening of the C4-C5 disc space anteriorly is unchanged from the prior. Mild broad dextrocurvature. Skull base and vertebrae: Similar C5-C7 solid osseous fusion. No evidence of acute fracture. Vertebral body heights are maintained. Soft tissues and spinal canal: No prevertebral fluid or swelling. No visible canal hematoma. Disc levels: Posterior disc bulges at multiple mid upper cervical levels. At C3-C4 there is disc bulge and ligamentum flavum thickening with possibly moderate canal stenosis. Multilevel facet hypertrophy, greatest on the left at C4-C5. Upper chest: Visualized lung apices are clear. Other: Calcific atherosclerosis of the carotid  arteries. IMPRESSION: 1. No evidence of acute fracture or traumatic malalignment. 2. Multiple posterior disc bulges with possibly moderate canal stenosis at C3-C4. An MRI could better evaluate the canal/cord/foramina if clinically indicated. 3. Similar osseous fusion from C5-C7. Electronically Signed   By: Margaretha Sheffield MD   On: 08/11/2020 13:52   DG Chest Port 1 View  Result Date: 08/11/2020 CLINICAL DATA:  Fall, left hip pain EXAM: PORTABLE CHEST 1 VIEW COMPARISON:  02/18/2020 FINDINGS: Small right pleural effusion with associated pleural thickening, chronic. Right lower lobe scarring/atelectasis. Left lung is clear.  No pneumothorax. Mild cardiomegaly.  Thoracic aortic atherosclerosis. Median sternotomy. IMPRESSION: Small right pleural effusion with associated lower lobe scarring and pleural thickening, chronic. Electronically Signed   By: Julian Hy M.D.   On: 08/11/2020 14:06   DG Knee Complete 4 Views Left  Result Date: 08/11/2020 CLINICAL DATA:  Fall with pain. EXAM: LEFT KNEE - COMPLETE 4+ VIEW COMPARISON:  None. FINDINGS: No evidence of fracture or dislocation. No joint effusion. Regional arterial calcification incidentally noted. IMPRESSION: No acute finding. Electronically Signed   By: Nelson Chimes M.D.   On: 08/11/2020 14:06   DG Hip Unilat W or Wo Pelvis 2-3 Views Left  Result Date: 08/11/2020 CLINICAL DATA:  Fall with left-sided pain. EXAM: DG HIP (WITH OR WITHOUT PELVIS) 2-3V LEFT COMPARISON:  None. FINDINGS: Angulated femoral neck fracture on the left. No intertrochanteric component. Pelvic bones appear intact. IMPRESSION: Angulated fracture of the left femoral neck. Electronically Signed   By: Nelson Chimes M.D.   On: 08/11/2020 14:08    Procedures Procedures   Medications Ordered in ED Medications  morphine 4 MG/ML injection 4 mg (4 mg Intravenous Given 08/11/20 1501)    ED Course  I have reviewed the triage vital signs and the nursing  notes.  Pertinent labs &  imaging results that were available during my care of the patient were reviewed by me and considered in my medical decision making (see chart for details).  Clinical Course as of 08/11/20 1616  Wed Aug 11, 2020  1527 Dr. Amedeo Kinsman, orthopedics, would like admission to medicine for medical clearance and then n.p.o. after midnight.  Plan for surgical intervention at some point tomorrow.  He plans to see him in the morning, but can call her this evening if she prefers. [GG]  [id number] I spoke with hospitalist who will see and admit. [GG]    Clinical Course User Index [GG] Corena Herter, PA-C   MDM Rules/Calculators/A&P                          DEDEE SELLIN was evaluated in Emergency Department on 08/11/2020 for the symptoms described in the history of present illness. She was evaluated in the context of the global COVID-19 pandemic, which necessitated consideration that the patient might be at risk for infection with the SARS-CoV-2 virus that causes COVID-19. Institutional protocols and algorithms that pertain to the evaluation of patients at risk for COVID-19 are in a state of rapid change based on information released by regulatory bodies including the CDC and federal and state organizations. These policies and algorithms were followed during the patient's care in the ED.  I personally reviewed patient's medical chart and all notes from triage and staff during today's encounter. I have also ordered and reviewed all labs and imaging that I felt to be medically necessary in the evaluation of this patient's complaints and with consideration of their physical exam. If needed, translation services were available and utilized.   Patient with mechanical ground-level fall while on blood thinners.  Typically ambulatory without assistance.  She was vacuuming the floor when she tripped over the cord.  She did strike her head.  Did not lose consciousness.  Denies any dizziness, blurred vision, diplopia, or other  symptoms.  She however is complaining of significant left leg pain, spanning from hip to ankle.  Will obtain CT head and cervical spine, chest x-ray, and imaging of hip, knee, and ankle left leg.  We will treat with morphine given her pain symptoms.  Trauma laboratory work-up to be obtained.  CT head is without any acute intracranial or calvarial normality.  CT cervical spine obtained demonstrates no evidence of fracture or traumatic malalignment.  There was however multiple posterior disc bulges with possible canal stenosis, recommending MRI if clinically indicated.  She is moving her arms well and is neurovascular intact.  Plain films obtained of knee and chest are without any acute findings.  However, there is a nondisplaced posterior malleolar fracture involving left ankle and most notably an angulated femoral neck fracture.  Will consult orthopedist, Dr. Amedeo Kinsman, regarding these findings.  Suspect that she will require hospitalist admission given her DM, CVA, and other chronic disease.  Dr. Amedeo Kinsman, orthopedics, would like admission to medicine for medical clearance and then n.p.o. after midnight.  Plan for surgical intervention at some point tomorrow.  He plans to see him in the morning, but can call her this evening if she prefers.  I spoke with hospitalist who will see and admit.   Final Clinical Impression(s) / ED Diagnoses Final diagnoses:  Fall  Closed fracture of neck of left femur, initial encounter ([place])    Rx / DC Orders ED Discharge Orders  None       Reita Chard 08/11/20 1616    Luna Fuse, MD 08/14/20 2004

## 2020-08-12 ENCOUNTER — Inpatient Hospital Stay (HOSPITAL_COMMUNITY): Payer: Medicare Other

## 2020-08-12 ENCOUNTER — Encounter (HOSPITAL_COMMUNITY): Payer: Self-pay | Admitting: Internal Medicine

## 2020-08-12 ENCOUNTER — Encounter (HOSPITAL_COMMUNITY)
Admission: EM | Disposition: A | Discharge status: Skilled Nursing Facility | Payer: Self-pay | Source: Home / Self Care | Attending: Internal Medicine

## 2020-08-12 DIAGNOSIS — I361 Nonrheumatic tricuspid (valve) insufficiency: Secondary | ICD-10-CM | POA: Diagnosis not present

## 2020-08-12 DIAGNOSIS — I342 Nonrheumatic mitral (valve) stenosis: Secondary | ICD-10-CM | POA: Diagnosis not present

## 2020-08-12 DIAGNOSIS — I50810 Right heart failure, unspecified: Secondary | ICD-10-CM

## 2020-08-12 DIAGNOSIS — I340 Nonrheumatic mitral (valve) insufficiency: Secondary | ICD-10-CM

## 2020-08-12 LAB — BASIC METABOLIC PANEL
Anion gap: 8 (ref 5–15)
BUN: 30 mg/dL — ABNORMAL HIGH (ref 8–23)
CO2: 26 mmol/L (ref 22–32)
Calcium: 8.7 mg/dL — ABNORMAL LOW (ref 8.9–10.3)
Chloride: 102 mmol/L (ref 98–111)
Creatinine, Ser: 1.82 mg/dL — ABNORMAL HIGH (ref 0.44–1.00)
GFR, Estimated: 27 mL/min — ABNORMAL LOW (ref >60)
Glucose, Bld: 125 mg/dL — ABNORMAL HIGH (ref 70–99)
Potassium: 4.4 mmol/L (ref 3.5–5.1)
Sodium: 136 mmol/L (ref 135–145)

## 2020-08-12 LAB — ECHOCARDIOGRAM COMPLETE BUBBLE STUDY
AR max vel: 2.55 cm2
AV Area VTI: 2.45 cm2
AV Area mean vel: 2.54 cm2
AV Mean grad: 7 mmHg
AV Peak grad: 12.7 mmHg
Ao pk vel: 1.78 m/s
Calc EF: 48.7 %
MV VTI: 2.59 cm2
S' Lateral: 3.4 cm
Single Plane A2C EF: 54.6 %
Single Plane A4C EF: 41.6 %

## 2020-08-12 LAB — CBC
HCT: 31.5 % — ABNORMAL LOW (ref 36.0–46.0)
Hemoglobin: 10.4 g/dL — ABNORMAL LOW (ref 12.0–15.0)
MCH: 34.3 pg — ABNORMAL HIGH (ref 26.0–34.0)
MCHC: 33 g/dL (ref 30.0–36.0)
MCV: 104 fL — ABNORMAL HIGH (ref 80.0–100.0)
Platelets: 144 10*3/uL — ABNORMAL LOW (ref 150–400)
RBC: 3.03 MIL/uL — ABNORMAL LOW (ref 3.87–5.11)
RDW: 14.6 % (ref 11.5–15.5)
WBC: 6.5 10*3/uL (ref 4.0–10.5)
nRBC: 0 % (ref 0.0–0.2)

## 2020-08-12 LAB — PROTIME-INR
INR: 2.3 — ABNORMAL HIGH (ref 0.8–1.2)
INR: 1.5 — ABNORMAL HIGH (ref 0.8–1.2)
Prothrombin Time: 25.3 seconds — ABNORMAL HIGH (ref 11.4–15.2)
Prothrombin Time: 18 seconds — ABNORMAL HIGH (ref 11.4–15.2)

## 2020-08-12 LAB — SURGICAL PCR SCREEN
MRSA, PCR: NEGATIVE
Staphylococcus aureus: NEGATIVE

## 2020-08-12 LAB — ABO/RH: ABO/RH(D): A POS

## 2020-08-12 LAB — HEPARIN LEVEL (UNFRACTIONATED)
Heparin Unfractionated: <0.1 [IU]/mL — ABNORMAL LOW (ref 0.30–0.70)
Heparin Unfractionated: 0.11 IU/mL — ABNORMAL LOW (ref 0.30–0.70)
Heparin Unfractionated: <0.1 IU/mL — ABNORMAL LOW (ref 0.30–0.70)

## 2020-08-12 SURGERY — HEMIARTHROPLASTY, HIP, DIRECT ANTERIOR APPROACH, FOR FRACTURE
Anesthesia: Choice | Site: Hip | Laterality: Left

## 2020-08-12 MED ORDER — CEFAZOLIN SODIUM-DEXTROSE 2-4 GM/100ML-% IV SOLN
2.0000 g | INTRAVENOUS | Status: AC
  Administered 2020-08-13: 2 g via INTRAVENOUS
  Filled 2020-08-12: qty 100

## 2020-08-12 MED ORDER — ENSURE ENLIVE PO LIQD
237.0000 mL | Freq: Two times a day (BID) | ORAL | Status: DC
  Administered 2020-08-16 – 2020-08-19 (×5): 237 mL via ORAL

## 2020-08-12 MED ORDER — SODIUM CHLORIDE 0.9% IV SOLUTION
Freq: Once | INTRAVENOUS | Status: AC
Start: 2020-08-12 — End: 2020-08-12

## 2020-08-12 MED ORDER — VITAMIN K1 10 MG/ML IJ SOLN
10.0000 mg | Freq: Once | INTRAVENOUS | Status: AC
  Administered 2020-08-12: 10 mg via INTRAVENOUS
  Filled 2020-08-12: qty 1

## 2020-08-12 MED ORDER — HEPARIN BOLUS VIA INFUSION
1500.0000 [IU] | INTRAVENOUS | Status: AC
  Administered 2020-08-12: 1500 [IU] via INTRAVENOUS
  Filled 2020-08-12 (×2): qty 1500

## 2020-08-12 MED ORDER — TRANEXAMIC ACID 1000 MG/10ML IV SOLN
2000.0000 mg | INTRAVENOUS | Status: DC
  Filled 2020-08-12 (×2): qty 20

## 2020-08-12 MED ORDER — ADULT MULTIVITAMIN W/MINERALS CH
1.0000 | ORAL_TABLET | Freq: Every day | ORAL | Status: DC
  Administered 2020-08-14 – 2020-08-19 (×6): 1 via ORAL
  Filled 2020-08-12 (×6): qty 1

## 2020-08-12 MED ORDER — TRANEXAMIC ACID-NACL 1000-0.7 MG/100ML-% IV SOLN
1000.0000 mg | INTRAVENOUS | Status: AC
  Administered 2020-08-13: 1000 mg via INTRAVENOUS
  Filled 2020-08-12: qty 100

## 2020-08-12 MED ORDER — POVIDONE-IODINE 10 % EX SWAB
2.0000 "application " | Freq: Once | CUTANEOUS | Status: AC
  Administered 2020-08-13: 2 via TOPICAL

## 2020-08-12 NOTE — Progress Notes (Signed)
[facility] for heparin gtt  Indication: mechanical AVR, afib   Allergies  Allergen Reactions  . Tape Itching    Redness, Please use "paper" tape only    Patient Measurements: Height: '5\' 7"'$  (170.2 cm) Weight: 52.2 kg (115 lb) IBW/kg (Calculated) : 61.6   Vital Signs: Temp: 98.7 F (37.1 C) (05/26 0533) Temp Source: Oral (05/26 0533) BP: 123/51 (05/26 0533) Pulse Rate: 95 (05/26 0533)  Labs: Recent Labs    08/11/20 1509 08/12/20 0049 08/12/20 0819  HGB 11.8* 10.4*  --   HCT 38.4 31.5*  --   PLT 176 144*  --   LABPROT 24.2* 25.3*  --   INR 2.2* 2.3*  --   HEPARINUNFRC  --  <0.10* 0.11*  CREATININE 1.31*  --   --     Estimated Creatinine Clearance: 27.3 mL/min (A) (by C-G formula based on SCr of 1.31 mg/dL (H)).  Assessment: Mary [name] a 80 y.o. female on warfarin PTA for mechanical AVR (1995) and Afib. Warfarin is held for surgery. Admit INR was subtherapeutic at 2.2. Pharmacy consulted for heparin without bolus.    Heparin level 0.11 on gtt at 850 units/hr. Will target higher end of goal given mechanical valve as long as no bleeding. Vitamin K IV '10mg'$  ordered. No issues with line or bleeding reported per RN. Surgery planned for 5/27.   Goal of Therapy:  Heparin level 0.3-0.7 units/ml (target higher end of goal) Monitor platelets by anticoagulation protocol: Yes   Plan:  Increase  heparin to 950 units/hr Monitor daily INR, HL, CBC/plt Monitor for signs/symptoms of bleeding  F/u restart warfarin post-op     Benetta Spar, PharmD, BCPS, [facility] Clinical Pharmacist  Please check AMION for all [place] phone numbers After 10:00 PM, call [place]

## 2020-08-12 NOTE — TOC CAGE-AID Note (Signed)
Transition of Care [facility]) - CAGE-AID Screening   Patient Details  Name: Mary [name] MRN: [id number] Date of Birth: 14-Feb-1941    Elvina Sidle, RN  Trauma Response Nurse Phone Number: [phone] 08/12/2020, 10:50 AM       CAGE-AID Screening:    Have You Ever Felt You Ought to Cut Down on Your Drinking or Drug Use?: No Have People Annoyed You By Critizing Your Drinking Or Drug Use?: No Have You Felt Bad Or Guilty About Your Drinking Or Drug Use?: No Have You Ever Had a Drink or Used Drugs First Thing In The Morning to Steady Your Nerves or to Get Rid of a Hangover?: No CAGE-AID Score: 0  Substance Abuse Education Offered: No (Denies alcohol/drug use)

## 2020-08-12 NOTE — Progress Notes (Signed)
  Echocardiogram 2D Echocardiogram has been performed.  Mary [name] 08/12/2020, 10:11 AM

## 2020-08-12 NOTE — Progress Notes (Signed)
Initial Nutrition Assessment  DOCUMENTATION CODES:   Non-severe (moderate) malnutrition in context of social or environmental circumstances,Underweight  INTERVENTION:   -Ensure Enlive po BID, each supplement provides 350 kcal and 20 grams of protein -MVI with minerals daily  NUTRITION DIAGNOSIS:   Moderate Malnutrition related to social / environmental circumstances as evidenced by energy intake < or equal to 75% for > or equal to 1 month,mild fat depletion,mild muscle depletion.  GOAL:   Patient will meet greater than or equal to 90% of their needs  MONITOR:   Supplement acceptance,PO intake,Labs,Weight trends,Skin,I & O's  REASON FOR ASSESSMENT:   Consult Assessment of nutrition requirement/status,Hip fracture protocol  ASSESSMENT:   Mary [name]  is a 80 y.o. female, with a history of persistent atrial fibrillation, chronic diastolic CHF, recurrent pleural effusions, mechanical aortic valve replacement 1995 on anticoagulation, HTN, HLD, OSA, cirrhosis, orthostatic hypotension, history of CVA, DM type II.  Pt admitted with lt hip fracture.   Per orthopedics notes, plan for hemi tomorrow.  Spoke with pt at bedside, who reports she consumed most of her breakfast this morning. She reports that she has been eating less than she usually does PTA- intake has declined secondary to not liking the food at the facility. Per pt, she consumes 3 meals per day (Breakfast: cereal, Lunch: half sandwich, Dinner: potatoes and beans). Pt also consumes 1-2 Ensure supplements daily.  Per pt, her UBW is around 178# and has decreased from a size 18 to a size 12. She endorses that weight loss has occurred over the past 3 months, since living in facility. Reviewed wt hx; wt has been stable over the past 6 months.   Pt anxious about upcoming surgery- provided emotional support. Discussed importance of good meal and supplement intake to promote healing. Pt amenable to Ensure.   Medications reviewed and  include colace, chronulac, and demadex.   Labs reviewed.   NUTRITION - FOCUSED PHYSICAL EXAM:  Flowsheet Row Most Recent Value  Orbital Region Mild depletion  Upper Arm Region Mild depletion  Thoracic and Lumbar Region No depletion  Buccal Region No depletion  Temple Region Mild depletion  Clavicle Bone Region Mild depletion  Clavicle and Acromion Bone Region Mild depletion  Scapular Bone Region Mild depletion  Dorsal Hand No depletion  Patellar Region No depletion  Anterior Thigh Region No depletion  Posterior Calf Region No depletion  Edema (RD Assessment) None  Hair Reviewed  Eyes Reviewed  Mouth Reviewed  Skin Reviewed  Nails Reviewed       Diet Order:   Diet Order            Diet NPO time specified  Diet effective midnight           Diet Carb Modified Fluid consistency: Thin; Room service appropriate? Yes  Diet effective now                 EDUCATION NEEDS:   Education needs have been addressed  Skin:  Skin Assessment: Reviewed RN Assessment  Last BM:  08/11/20  Height:   Ht Readings from Last 1 Encounters:  08/11/20 '5\' 7"'$  (1.702 m)    Weight:   Wt Readings from Last 1 Encounters:  08/11/20 52.2 kg    Ideal Body Weight:  61.4 kg  BMI:  Body mass index is 18.01 kg/m.  Estimated Nutritional Needs:   Kcal:  1550-1750  Protein:  80-95 grams  Fluid:  > 1.5 L    Loistine Chance, RD, LDN, CDCES Registered  Dietitian II Certified Diabetes Care and Education Specialist Please refer to [facility] for RD and/or RD on-call/weekend/after hours pager

## 2020-08-12 NOTE — Progress Notes (Signed)
PROGRESS NOTE    Mary [name]  [id number] DOB: 14-Jul-1940 DOA: 08/11/2020 PCP: Bonnita Nasuti, MD   Chief Complaint  Patient presents with  . Fall  Brief Narrative: 80 year old female with persistent A. fib, chronic diastolic CHF, recurrent pleural effusion, mechanical aortic valve in 1995 on Coumadin, HTN, HLD, OSA, psoriasis, orthostatic hypotension, history of CVA, T2DM lives independently at [facility] presented after a fall and is having hip pain after she tripped over the cord while vacuuming her floor and fell onto her left side.  She had no loss of consciousness.  She was seen in the ED INR 2.2, creatinine 1.3 hemoglobin 11.8, x-ray showed left hip fracture.  Anesthesia had any pain advised transferred to [facility] for cardiology evaluation Dr. Erlinda Hong from orthopedic was contacted and transferred.  Subjective: Seen this morning.  Pain is controlled.  Husband is at the bedside who is hard of hearing. Echo is being done. Overnight no fever no nausea vomiting.  Assessment & Plan:  Left hip fracture/posterior malleolar fracture secondary to mechanical fall: Seen by orthopedic planning for operative intervention of the left hip and nonoperative management of the malleolar fracture after cardiology clearance.  Echo pending.  Cardiology following.  Continue pain control with oral and IV opiates.  High risk for intubation/CVA so cardiology strongly encouraged regional anesthesia, along with further recommendation (fluid, as needed IV Lasix, dobutamine for RV support in case of perioperative cardiogenic shock.  Defer to cardio/orthopedics.  Mechanical AVR since 1995 on long-term anticoagulation with Coumadin: Being bridged with heparin holding Coumadin. Minimize reversal per cardio. Echo is done preoperatively to reassess.  Persistent A. fib longstanding heart rate goal less than 120 avoiding CCB or BB, a single dose of digoxin 30 mg can be considered for acute rate control along with  beta-blocker p.o. every 6 hours started 1200 mg and uptitrate as needed okay to use short-term amiodarone infusion if blood pressure limits use of beta-blocker  History of combined systolic and diastolic CHF/mild pulmonary hypertension-plan per cardiology see above, echo pending  T2DM: CBGs stable A1c 5.1 in 2021  Recent Labs  Lab 08/11/20 1914  GLUCAP 99   Chronic anemia hemoglobin stable.  Monitor Recent Labs  Lab 08/11/20 1509 08/12/20 0049  HGB 11.8* 10.4*  HCT 38.4 31.5*   OSA noncompliant with CPAP  HLD on simvastatin History of CVA-stable Cirrhosis-stable CKD stage IIIb monitor renal function. Stable.  Based on creatinine as high as 2 in the past. Recent Labs    10/31/19 0427 11/01/19 0703 11/02/19 0706 12/08/19 0042 01/15/20 1325 01/16/20 0455 01/17/20 0600 02/11/20 1311 02/18/20 1830 08/11/20 1509  BUN 45* 45* 45* 37* 45* 31* 23 26* 22 24*  CREATININE 2.00* 1.78* 1.58* 1.35* 2.01* 1.39* 1.16* 1.55* 1.26* 1.31*   Low BMI at 18.  Dietitian on board.  Diet Order            Diet Carb Modified Fluid consistency: Thin; Room service appropriate? Yes  Diet effective now                 Patient's Body mass index is 18.01 kg/m.  DVT prophylaxis: heparin gtt Code Status:   Code Status: Full Code  Family Communication: plan of care discussed with patient and her husband at bedside.  Status is: Inpatient  Remains inpatient appropriate because:IV treatments appropriate due to intensity of illness or inability to take PO and For management of hip fracture Dispo: The patient is from: Independent living facility at [facility]  Anticipated d/c is to: SNF              Patient currently is not medically stable to d/c.   Difficult to place patient No Unresulted Labs (From admission, onward)          Start     Ordered   08/12/20 1800  Heparin level (unfractionated)  Once-Timed,   TIMED       Question:  Specimen collection method  Answer:  Lab=Lab  collect   08/12/20 0957   08/12/20 0500  Heparin level (unfractionated)  Daily,   R      08/11/20 1708   08/12/20 0500  CBC  Daily,   R      08/11/20 1708   Signed and Held  Basic metabolic panel  Tomorrow morning,   R        Signed and Held         Medications reviewed:  Scheduled Meds: . sodium chloride   Intravenous Once  . buPROPion  150 mg Oral Daily  . busPIRone  5 mg Oral BID  . docusate sodium  100 mg Oral BID  . lactulose  20 g Oral Daily  . pantoprazole  40 mg Oral QAC breakfast  . torsemide  20 mg Oral Daily   Continuous Infusions: . heparin 850 Units/hr (08/12/20 0124)  . phytonadione (VITAMIN K) IV      Consultants:see note  Procedures:see note  Antimicrobials: Anti-infectives (From admission, onward)   None     Culture/Microbiology    Component Value Date/Time   SDES PERITONEAL ABDOMEN 10/31/2019 1657   SDES PERITONEAL ABDOMEN 10/31/2019 1657   SPECREQUEST BOTTLES DRAWN AEROBIC AND ANAEROBIC 10/31/2019 1657   SPECREQUEST NONE 10/31/2019 1657   CULT  10/31/2019 1657    NO GROWTH 5 DAYS Performed at [facility], [place] [address], [place], [place] [address]    REPTSTATUS 11/05/2019 FINAL 10/31/2019 1657   REPTSTATUS 10/31/2019 FINAL 10/31/2019 1657    Other culture-see note  Objective: Vitals: Today's Vitals   08/12/20 0533 08/12/20 0900 08/12/20 0936 08/12/20 1015  BP: (!) 123/51 (!) 124/56    Pulse: 95 93    Resp: 18 17    Temp: 98.7 F (37.1 C) 98.5 F (36.9 C)    TempSrc: Oral Oral    SpO2: 98% 90%    Weight:      Height:      PainSc:   8  7    No intake or output data in the 24 hours ending 08/12/20 1033 Filed Weights   08/11/20 1126  Weight: 52.2 kg   Weight change:   Intake/Output from previous day: No intake/output data recorded. Intake/Output this shift: No intake/output data recorded. Filed Weights   08/11/20 1126  Weight: 52.2 kg    Examination: General exam: AAO x3, elderly frail, not in distress HEENT:Oral  mucosa moist, Ear/Nose WNL grossly,dentition normal. Respiratory system: bilaterally clear breath sound, no use of accessory muscle, non tender. Cardiovascular system: S1 & S2 +, mechanical heart valve sound, no JVD. Gastrointestinal system: Abdomen soft, NT,ND, BS+. Nervous System:Alert, awake, moving extremities Extremities: no edema, distal peripheral pulses palpable.  Left hip tender on palpation Skin: No rashes,no icterus. MSK: Normal muscle bulk,tone, power  Data Reviewed: I have personally reviewed following labs and imaging studies CBC: Recent Labs  Lab 08/11/20 1509 08/12/20 0049  WBC 9.3 6.5  HGB 11.8* 10.4*  HCT 38.4 31.5*  MCV 110.0* 104.0*  PLT 176 144*   Basic  Metabolic Panel: Recent Labs  Lab 08/11/20 1509  NA 135  K 3.8  CL 100  CO2 25  GLUCOSE 100*  BUN 24*  CREATININE 1.31*  CALCIUM 9.7   GFR: Estimated Creatinine Clearance: 27.3 mL/min (A) (by C-G formula based on SCr of 1.31 mg/dL (H)). Liver Function Tests: Recent Labs  Lab 08/11/20 1509  AST 28  ALT 16  ALKPHOS 109  BILITOT 1.2  PROT 9.0*  ALBUMIN 4.0   No results for input(s): LIPASE, AMYLASE in the last 168 hours. No results for input(s): AMMONIA in the last 168 hours. Coagulation Profile: Recent Labs  Lab 08/11/20 1509 08/12/20 0049  INR 2.2* 2.3*   Cardiac Enzymes: No results for input(s): CKTOTAL, CKMB, CKMBINDEX, TROPONINI in the last 168 hours. BNP (last 3 results) No results for input(s): PROBNP in the last 8760 hours. HbA1C: No results for input(s): HGBA1C in the last 72 hours. CBG: Recent Labs  Lab 08/11/20 1914  GLUCAP 99   Lipid Profile: No results for input(s): CHOL, HDL, LDLCALC, TRIG, CHOLHDL, LDLDIRECT in the last 72 hours. Thyroid Function Tests: No results for input(s): TSH, T4TOTAL, FREET4, T3FREE, THYROIDAB in the last 72 hours. Anemia Panel: No results for input(s): VITAMINB12, FOLATE, FERRITIN, TIBC, IRON, RETICCTPCT in the last 72 hours. Sepsis  Labs: Recent Labs  Lab 08/11/20 1509  LATICACIDVEN 1.9    Recent Results (from the past 240 hour(s))  Resp Panel by RT-PCR (Flu A&B, Covid) Nasopharyngeal Swab     Status: None   Collection Time: 08/11/20 12:21 PM   Specimen: Nasopharyngeal Swab; Nasopharyngeal(NP) swabs in vial transport medium  Result Value Ref Range Status   SARS Coronavirus 2 by RT PCR NEGATIVE NEGATIVE Final    Comment: (NOTE) SARS-CoV-2 target nucleic acids are NOT DETECTED.  The SARS-CoV-2 RNA is generally detectable in upper respiratory specimens during the acute phase of infection. The lowest concentration of SARS-CoV-2 viral copies this assay can detect is 138 copies/mL. A negative result does not preclude SARS-Cov-2 infection and should not be used as the sole basis for treatment or other patient management decisions. A negative result may occur with  improper specimen collection/handling, submission of specimen other than nasopharyngeal swab, presence of viral mutation(s) within the areas targeted by this assay, and inadequate number of viral copies(<138 copies/mL). A negative result must be combined with clinical observations, patient history, and epidemiological information. The expected result is Negative.  Fact Sheet for Patients:  EntrepreneurPulse.com.au  Fact Sheet for Healthcare Providers:  IncredibleEmployment.be  This test is no t yet approved or cleared by the [place] FDA and  has been authorized for detection and/or diagnosis of SARS-CoV-2 by FDA under an Emergency Use Authorization (EUA). This EUA will remain  in effect (meaning this test can be used) for the duration of the COVID-19 declaration under Section 564(b)(1) of the Act, 21 U.S.C.section 360bbb-3(b)(1), unless the authorization is terminated  or revoked sooner.       Influenza A by PCR NEGATIVE NEGATIVE Final   Influenza B by PCR NEGATIVE NEGATIVE Final    Comment: (NOTE) The  Xpert Xpress SARS-CoV-2/FLU/RSV plus assay is intended as an aid in the diagnosis of influenza from Nasopharyngeal swab specimens and should not be used as a sole basis for treatment. Nasal washings and aspirates are unacceptable for Xpert Xpress SARS-CoV-2/FLU/RSV testing.  Fact Sheet for Patients: EntrepreneurPulse.com.au  Fact Sheet for Healthcare Providers: IncredibleEmployment.be  This test is not yet approved or cleared by the [place] FDA and has  been authorized for detection and/or diagnosis of SARS-CoV-2 by FDA under an Emergency Use Authorization (EUA). This EUA will remain in effect (meaning this test can be used) for the duration of the COVID-19 declaration under Section 564(b)(1) of the Act, 21 U.S.C. section 360bbb-3(b)(1), unless the authorization is terminated or revoked.  Performed at [facility], [address], [place], [place] [address]   Surgical pcr screen     Status: None   Collection Time: 08/12/20  1:00 AM   Specimen: Nasal Mucosa; Nasal Swab  Result Value Ref Range Status   MRSA, PCR NEGATIVE NEGATIVE Final   Staphylococcus aureus NEGATIVE NEGATIVE Final    Comment: (NOTE) The Xpert SA Assay (FDA approved for NASAL specimens in patients 7 years of age and older), is one component of a comprehensive surveillance program. It is not intended to diagnose infection nor to guide or monitor treatment. Performed at [facility], [place] [address] [place], [place] [address]      Radiology Studies: DG Ankle Complete Left  Result Date: 08/11/2020 CLINICAL DATA:  Recent fall with left ankle pain, initial encounter EXAM: LEFT ANKLE COMPLETE - 3+ VIEW COMPARISON:  01/04/2020 FINDINGS: Mild soft tissue swelling is noted. There is a lucency noted posteriorly suggestive of posterior malleolar fracture without significant displacement. No other fracture is seen. Well corticated bony densities are noted adjacent to the  medial malleolus consistent with prior injury. This is stable from the prior exam. No other focal abnormality is noted. IMPRESSION: Changes suggestive of posterior malleolar fracture without significant displacement. Electronically Signed   By: Inez Catalina M.D.   On: 08/11/2020 14:12   CT Head Wo Contrast  Result Date: 08/11/2020 CLINICAL DATA:  Fall with trauma to the head. EXAM: CT HEAD WITHOUT CONTRAST TECHNIQUE: Contiguous axial images were obtained from the base of the skull through the vertex without intravenous contrast. COMPARISON:  02/18/2020 FINDINGS: Brain: Age related volume loss. Mild chronic small-vessel ischemic change of the pons and cerebral hemispheric white matter. No sign of acute infarction, mass lesion, hemorrhage, hydrocephalus or extra-axial collection. Vascular: There is atherosclerotic calcification of the major vessels at the base of the brain. Skull: No skull fracture. Sinuses/Orbits: Previous functional endoscopic sinus surgery on the right. Chronic inflammatory changes of the right maxillary sinus. Orbits negative. Other: None IMPRESSION: No acute intracranial finding. Atrophy and chronic small-vessel ischemic changes. Chronic inflammatory changes of the right maxillary sinus. Previous functional endoscopic sinus surgery. Electronically Signed   By: Nelson Chimes M.D.   On: 08/11/2020 14:08   CT Cervical Spine Wo Contrast  Result Date: 08/11/2020 CLINICAL DATA:  Poly trauma.  Fall. EXAM: CT CERVICAL SPINE WITHOUT CONTRAST TECHNIQUE: Multidetector CT imaging of the cervical spine was performed without intravenous contrast. Multiplanar CT image reconstructions were also generated. COMPARISON:  January 15, 2020. FINDINGS: Alignment: Similar alignment to the prior. Similar mild straightening without substantial sagittal subluxation. Mild widening of the C4-C5 disc space anteriorly is unchanged from the prior. Mild broad dextrocurvature. Skull base and vertebrae: Similar C5-C7 solid  osseous fusion. No evidence of acute fracture. Vertebral body heights are maintained. Soft tissues and spinal canal: No prevertebral fluid or swelling. No visible canal hematoma. Disc levels: Posterior disc bulges at multiple mid upper cervical levels. At C3-C4 there is disc bulge and ligamentum flavum thickening with possibly moderate canal stenosis. Multilevel facet hypertrophy, greatest on the left at C4-C5. Upper chest: Visualized lung apices are clear. Other: Calcific atherosclerosis of the carotid arteries. IMPRESSION: 1. No  evidence of acute fracture or traumatic malalignment. 2. Multiple posterior disc bulges with possibly moderate canal stenosis at C3-C4. An MRI could better evaluate the canal/cord/foramina if clinically indicated. 3. Similar osseous fusion from C5-C7. Electronically Signed   By: Margaretha Sheffield MD   On: 08/11/2020 13:52   DG Chest Port 1 View  Result Date: 08/11/2020 CLINICAL DATA:  Fall, left hip pain EXAM: PORTABLE CHEST 1 VIEW COMPARISON:  02/18/2020 FINDINGS: Small right pleural effusion with associated pleural thickening, chronic. Right lower lobe scarring/atelectasis. Left lung is clear.  No pneumothorax. Mild cardiomegaly.  Thoracic aortic atherosclerosis. Median sternotomy. IMPRESSION: Small right pleural effusion with associated lower lobe scarring and pleural thickening, chronic. Electronically Signed   By: Julian Hy M.D.   On: 08/11/2020 14:06   DG Knee Complete 4 Views Left  Result Date: 08/11/2020 CLINICAL DATA:  Fall with pain. EXAM: LEFT KNEE - COMPLETE 4+ VIEW COMPARISON:  None. FINDINGS: No evidence of fracture or dislocation. No joint effusion. Regional arterial calcification incidentally noted. IMPRESSION: No acute finding. Electronically Signed   By: Nelson Chimes M.D.   On: 08/11/2020 14:06   DG Hip Unilat W or Wo Pelvis 2-3 Views Left  Result Date: 08/11/2020 CLINICAL DATA:  Fall with left-sided pain. EXAM: DG HIP (WITH OR WITHOUT PELVIS) 2-3V  LEFT COMPARISON:  None. FINDINGS: Angulated femoral neck fracture on the left. No intertrochanteric component. Pelvic bones appear intact. IMPRESSION: Angulated fracture of the left femoral neck. Electronically Signed   By: Nelson Chimes M.D.   On: 08/11/2020 14:08     LOS: 1 day   Antonieta Pert, MD [facility]  08/12/2020, 10:33 AM

## 2020-08-12 NOTE — Progress Notes (Signed)
ANTICOAGULATION CONSULT NOTE - Follow Up Consult  Pharmacy Consult for Heparin Indication: mechanical valve + afib  Allergies  Allergen Reactions  . Tape Itching    Redness, Please use "paper" tape only    Patient Measurements: Height: '5\' 7"'$  (170.2 cm) Weight: 52.2 kg (115 lb) IBW/kg (Calculated) : 61.6 Heparin Dosing Weight:  52.2 kg  Vital Signs: Temp: 98.1 F (36.7 C) (05/26 1615) Temp Source: Oral (05/26 1615) BP: 123/55 (05/26 1615) Pulse Rate: 96 (05/26 1615)  Labs: Recent Labs    08/11/20 1509 08/12/20 0049 08/12/20 0819 08/12/20 1737  HGB 11.8* 10.4*  --   --   HCT 38.4 31.5*  --   --   PLT 176 144*  --   --   LABPROT 24.2* 25.3*  --   --   INR 2.2* 2.3*  --   --   HEPARINUNFRC  --  <0.10* 0.11* <0.10*  CREATININE 1.31*  --   --   --     Estimated Creatinine Clearance: 27.3 mL/min (A) (by C-G formula based on SCr of 1.31 mg/dL (H)).  Assessment: Anticoag:  PTA warfarin for mechanical AVR+ afib, INR 2.3, held warfarin > vitamin K '10mg'$  IV (5/26) - Hep level <0.1 remains undetectable. Hgb 10.4. Plts 144. - PTA warfarin 2.'5mg'$  QD (per med rec- no notes. need to confirm w/ pt)  Goal of Therapy:  Heparin level 0.3-0.7 units/ml aim for higher end Monitor platelets by anticoagulation protocol: Yes   Plan:  L hip Surgery 5/27  Bolus IV heparin 1500 units/hr, Increase infusion to 1100 units/hr (goal 0.5-0.7, higher)  Monitor daily INR, HL, CBC/plt   Jhanvi Drakeford S. Alford Highland, PharmD, BCPS Clinical Staff Pharmacist [email or web address] Alford Highland, [place] 08/12/2020,7:21 PM

## 2020-08-12 NOTE — Consult Note (Signed)
Reason for Consult:Left hip fx Referring Physician: Doristine Counter Time called: 0730 Time at bedside: 0838   Mary [name] is an 80 y.o. female.  HPI: Mary [name] was vacuuming her apartment yesterday when she slipped on the cord and fell. She had immediate left hip pain and could not get up. She was brought to the ED where x-rays showed a left hip fx and orthopedic surgery was consulted. She c/o pain in the hip, knee, and ankle. She lives in ALF with her husband and generally uses a RW to ambulate.  Past Medical History:  Diagnosis Date  . Anxiety   . Diabetes mellitus with neuropathy ([place])   . Diabetic neuropathy ([facility])   . DJD (degenerative joint disease)   . H/O aortic valve replacement   . Hyperlipidemia   . Hypertension   . Neuromuscular disorder (H[facility])    neuropathy in feet  . Obesity   . Sleep apnea   . Stroke [facility])    " light stroke"  . Wears glasses     Past Surgical History:  Procedure Laterality Date  . ANKLE SURGERY     Left tendon repair  . AORTIC VALVE REPLACEMENT  03/1994  . CARDIAC CATHETERIZATION     1996 [facility]  . CERVICAL SPINE SURGERY    . COLONOSCOPY    . HEMORRHOID SURGERY    . IR PARACENTESIS  10/31/2019  . KNEE ARTHROSCOPY Right 04/13/2015   Procedure: RIGHT KNEE ARTHROSCOPY WITH DEBRIDEMENT AND PARTIAL MEDIAL MENISCECTOMY;  Surgeon: Mcarthur Rossetti, MD;  Location: [facility];  Service: Orthopedics;  Laterality: Right;  . KNEE ARTHROSCOPY W/ MENISCECTOMY Right 04/13/2015  . LUMBAR FUSION    . RIGHT HEART CATH N/A 10/28/2019   Procedure: RIGHT HEART CATH;  Surgeon: Leonie Man, MD;  Location: [place] [facility];  Service: Cardiovascular;  Laterality: N/A;    Family History  Problem Relation Age of Onset  . Pneumonia Sister   . Dementia Mother   . Diabetes Sister     Social History:  reports that she has never smoked. She has never used smokeless tobacco. She reports that she does not drink alcohol and does not use drugs.  Allergies:  Allergies   Allergen Reactions  . Tape Itching    Redness, Please use "paper" tape only    Medications: I have reviewed the patient's current medications.  Results for orders placed or performed during the hospital encounter of 08/11/20 (from the past 48 hour(s))  Resp Panel by RT-PCR (Flu A&B, Covid) Nasopharyngeal Swab     Status: None   Collection Time: 08/11/20 12:21 PM   Specimen: Nasopharyngeal Swab; Nasopharyngeal(NP) swabs in vial transport medium  Result Value Ref Range   SARS Coronavirus 2 by RT PCR NEGATIVE NEGATIVE    Comment: (NOTE) SARS-CoV-2 target nucleic acids are NOT DETECTED.  The SARS-CoV-2 RNA is generally detectable in upper respiratory specimens during the acute phase of infection. The lowest concentration of SARS-CoV-2 viral copies this assay can detect is 138 copies/mL. A negative result does not preclude SARS-Cov-2 infection and should not be used as the sole basis for treatment or other patient management decisions. A negative result may occur with  improper specimen collection/handling, submission of specimen other than nasopharyngeal swab, presence of viral mutation(s) within the areas targeted by this assay, and inadequate number of viral copies(<138 copies/mL). A negative result must be combined with clinical observations, patient history, and epidemiological information. The expected result is Negative.  Fact Sheet for Patients:  EntrepreneurPulse.com.au  Fact Sheet for Healthcare Providers:  IncredibleEmployment.be  This test is no t yet approved or cleared by the [place] FDA and  has been authorized for detection and/or diagnosis of SARS-CoV-2 by FDA under an Emergency Use Authorization (EUA). This EUA will remain  in effect (meaning this test can be used) for the duration of the COVID-19 declaration under Section 564(b)(1) of the Act, 21 U.S.C.section 360bbb-3(b)(1), unless the authorization is terminated  or  revoked sooner.       Influenza A by PCR NEGATIVE NEGATIVE   Influenza B by PCR NEGATIVE NEGATIVE    Comment: (NOTE) The Xpert Xpress SARS-CoV-2/FLU/RSV plus assay is intended as an aid in the diagnosis of influenza from Nasopharyngeal swab specimens and should not be used as a sole basis for treatment. Nasal washings and aspirates are unacceptable for Xpert Xpress SARS-CoV-2/FLU/RSV testing.  Fact Sheet for Patients: EntrepreneurPulse.com.au  Fact Sheet for Healthcare Providers: IncredibleEmployment.be  This test is not yet approved or cleared by the [place] FDA and has been authorized for detection and/or diagnosis of SARS-CoV-2 by FDA under an Emergency Use Authorization (EUA). This EUA will remain in effect (meaning this test can be used) for the duration of the COVID-19 declaration under Section 564(b)(1) of the Act, 21 U.S.C. section 360bbb-3(b)(1), unless the authorization is terminated or revoked.  Performed at [facility], [address] [place], [place] [address]   Urinalysis, Routine w reflex microscopic Urine, Clean Catch     Status: Abnormal   Collection Time: 08/11/20 12:21 PM  Result Value Ref Range   Color, Urine STRAW (A) YELLOW   APPearance CLEAR CLEAR   Specific Gravity, Urine 1.005 1.005 - 1.030   pH 6.0 5.0 - 8.0   Glucose, UA NEGATIVE NEGATIVE mg/dL   Hgb urine dipstick NEGATIVE NEGATIVE   Bilirubin Urine NEGATIVE NEGATIVE   Ketones, ur NEGATIVE NEGATIVE mg/dL   Protein, ur NEGATIVE NEGATIVE mg/dL   Nitrite NEGATIVE NEGATIVE   Leukocytes,Ua NEGATIVE NEGATIVE    Comment: Performed at [facility], [address], [place], [place] [address]  Comprehensive metabolic panel     Status: Abnormal   Collection Time: 08/11/20  3:09 PM  Result Value Ref Range   Sodium 135 135 - 145 mmol/L   Potassium 3.8 3.5 - 5.1 mmol/L   Chloride 100 98 - 111 mmol/L   CO2 25 22 - 32 mmol/L   Glucose, Bld 100 (H) 70 - 99 mg/dL     Comment: Glucose reference range applies only to samples taken after fasting for at least 8 hours.   BUN 24 (H) 8 - 23 mg/dL   Creatinine, Ser 1.31 (H) 0.44 - 1.00 mg/dL   Calcium 9.7 8.9 - 10.3 mg/dL   Total Protein 9.0 (H) 6.5 - 8.1 g/dL   Albumin 4.0 3.5 - 5.0 g/dL   AST 28 15 - 41 U/L   ALT 16 0 - 44 U/L   Alkaline Phosphatase 109 38 - 126 U/L   Total Bilirubin 1.2 0.3 - 1.2 mg/dL   GFR, Estimated 41 (L) >60 mL/min    Comment: (NOTE) Calculated using the CKD-EPI Creatinine Equation (2021)    Anion gap 10 5 - 15    Comment: Performed at [facility], [address], [place], [place] [address]  CBC     Status: Abnormal   Collection Time: 08/11/20  3:09 PM  Result Value Ref Range   WBC 9.3 4.0 - 10.5 K/uL   RBC 3.49 (L) 3.87 -  5.11 MIL/uL   Hemoglobin 11.8 (L) 12.0 - 15.0 g/dL   HCT 38.4 36.0 - 46.0 %   MCV 110.0 (H) 80.0 - 100.0 fL   MCH 33.8 26.0 - 34.0 pg   MCHC 30.7 30.0 - 36.0 g/dL   RDW 14.6 11.5 - 15.5 %   Platelets 176 150 - 400 K/uL   nRBC 0.0 0.0 - 0.2 %    Comment: Performed at [facility], [address] [place], [place] [address]  Lactic acid, plasma     Status: None   Collection Time: 08/11/20  3:09 PM  Result Value Ref Range   Lactic Acid, Venous 1.9 0.5 - 1.9 mmol/L    Comment: Performed at [facility], [address], [place], [place] [address]  Protime-INR     Status: Abnormal   Collection Time: 08/11/20  3:09 PM  Result Value Ref Range   Prothrombin Time 24.2 (H) 11.4 - 15.2 seconds   INR 2.2 (H) 0.8 - 1.2    Comment: (NOTE) INR goal varies based on device and disease states. Performed at [facility], [address] [place], [place] [address]   Sample to Blood Bank     Status: None   Collection Time: 08/11/20  3:09 PM  Result Value Ref Range   Blood Bank Specimen SAMPLE AVAILABLE FOR TESTING    Sample Expiration      08/14/2020,2359 Performed at [facility], [address] [place], [place] [address]   CBG monitoring, ED     Status: None    Collection Time: 08/11/20  7:14 PM  Result Value Ref Range   Glucose-Capillary 99 70 - 99 mg/dL    Comment: Glucose reference range applies only to samples taken after fasting for at least 8 hours.  CBC     Status: Abnormal   Collection Time: 08/12/20 12:49 AM  Result Value Ref Range   WBC 6.5 4.0 - 10.5 K/uL   RBC 3.03 (L) 3.87 - 5.11 MIL/uL   Hemoglobin 10.4 (L) 12.0 - 15.0 g/dL   HCT 31.5 (L) 36.0 - 46.0 %   MCV 104.0 (H) 80.0 - 100.0 fL   MCH 34.3 (H) 26.0 - 34.0 pg   MCHC 33.0 30.0 - 36.0 g/dL   RDW 14.6 11.5 - 15.5 %   Platelets 144 (L) 150 - 400 K/uL   nRBC 0.0 0.0 - 0.2 %    Comment: Performed at [facility] [address], [place], [place] [address]  Protime-INR     Status: Abnormal   Collection Time: 08/12/20 12:49 AM  Result Value Ref Range   Prothrombin Time 25.3 (H) 11.4 - 15.2 seconds   INR 2.3 (H) 0.8 - 1.2    Comment: (NOTE) INR goal varies based on device and disease states. Performed at [facility], [place] [address], [place], [name] [address]   Heparin level (unfractionated)     Status: Abnormal   Collection Time: 08/12/20 12:49 AM  Result Value Ref Range   Heparin Unfractionated <0.10 (L) 0.30 - 0.70 IU/mL    Comment: (NOTE) The clinical reportable range upper limit is being lowered to >1.10 to align with the FDA approved guidance for the current laboratory assay.  If heparin results are below expected values, and patient dosage has  been confirmed, suggest follow up testing of antithrombin III levels. Performed at [facility], [place] [address] [place], [place] [address]   Surgical pcr screen     Status: None  Collection Time: 08/12/20  1:00 AM   Specimen: Nasal Mucosa; Nasal Swab  Result Value Ref Range   MRSA, PCR NEGATIVE NEGATIVE   Staphylococcus aureus NEGATIVE NEGATIVE    Comment: (NOTE) The Xpert SA Assay (FDA approved for NASAL specimens in patients 46 years of age and older), is one component of a  comprehensive surveillance program. It is not intended to diagnose infection nor to guide or monitor treatment. Performed at [facility], [place] [address] [place], [place] [address]   ABO/Rh     Status: None   Collection Time: 08/12/20  7:10 AM  Result Value Ref Range   ABO/RH(D)      A POS Performed at [facility] [address] [place], [place] [address]   Prepare fresh frozen plasma     Status: None (Preliminary result)   Collection Time: 08/12/20  8:26 AM  Result Value Ref Range   Unit Number [id number]    Blood Component Type THAWED PLASMA    Unit division 00    Status of Unit ALLOCATED    Transfusion Status      OK TO TRANSFUSE Performed at [facility] [address], [place], [place] [address]     DG Ankle Complete Left  Result Date: 08/11/2020 CLINICAL DATA:  Recent fall with left ankle pain, initial encounter EXAM: LEFT ANKLE COMPLETE - 3+ VIEW COMPARISON:  01/04/2020 FINDINGS: Mild soft tissue swelling is noted. There is a lucency noted posteriorly suggestive of posterior malleolar fracture without significant displacement. No other fracture is seen. Well corticated bony densities are noted adjacent to the medial malleolus consistent with prior injury. This is stable from the prior exam. No other focal abnormality is noted. IMPRESSION: Changes suggestive of posterior malleolar fracture without significant displacement. Electronically Signed   By: Inez Catalina M.D.   On: 08/11/2020 14:12   CT Head Wo Contrast  Result Date: 08/11/2020 CLINICAL DATA:  Fall with trauma to the head. EXAM: CT HEAD WITHOUT CONTRAST TECHNIQUE: Contiguous axial images were obtained from the base of the skull through the vertex without intravenous contrast. COMPARISON:  02/18/2020 FINDINGS: Brain: Age related volume loss. Mild chronic small-vessel ischemic change of the pons and cerebral hemispheric white matter. No sign of acute infarction, mass lesion, hemorrhage,  hydrocephalus or extra-axial collection. Vascular: There is atherosclerotic calcification of the major vessels at the base of the brain. Skull: No skull fracture. Sinuses/Orbits: Previous functional endoscopic sinus surgery on the right. Chronic inflammatory changes of the right maxillary sinus. Orbits negative. Other: None IMPRESSION: No acute intracranial finding. Atrophy and chronic small-vessel ischemic changes. Chronic inflammatory changes of the right maxillary sinus. Previous functional endoscopic sinus surgery. Electronically Signed   By: Nelson Chimes M.D.   On: 08/11/2020 14:08   CT Cervical Spine Wo Contrast  Result Date: 08/11/2020 CLINICAL DATA:  Poly trauma.  Fall. EXAM: CT CERVICAL SPINE WITHOUT CONTRAST TECHNIQUE: Multidetector CT imaging of the cervical spine was performed without intravenous contrast. Multiplanar CT image reconstructions were also generated. COMPARISON:  January 15, 2020. FINDINGS: Alignment: Similar alignment to the prior. Similar mild straightening without substantial sagittal subluxation. Mild widening of the C4-C5 disc space anteriorly is unchanged from the prior. Mild broad dextrocurvature. Skull base and vertebrae: Similar C5-C7 solid osseous fusion. No evidence of acute fracture. Vertebral body heights are maintained. Soft tissues and spinal canal: No prevertebral fluid or swelling. No visible canal hematoma. Disc levels: Posterior disc bulges at multiple mid upper cervical levels.  At C3-C4 there is disc bulge and ligamentum flavum thickening with possibly moderate canal stenosis. Multilevel facet hypertrophy, greatest on the left at C4-C5. Upper chest: Visualized lung apices are clear. Other: Calcific atherosclerosis of the carotid arteries. IMPRESSION: 1. No evidence of acute fracture or traumatic malalignment. 2. Multiple posterior disc bulges with possibly moderate canal stenosis at C3-C4. An MRI could better evaluate the canal/cord/foramina if clinically indicated.  3. Similar osseous fusion from C5-C7. Electronically Signed   By: Margaretha Sheffield MD   On: 08/11/2020 13:52   DG Chest Port 1 View  Result Date: 08/11/2020 CLINICAL DATA:  Fall, left hip pain EXAM: PORTABLE CHEST 1 VIEW COMPARISON:  02/18/2020 FINDINGS: Small right pleural effusion with associated pleural thickening, chronic. Right lower lobe scarring/atelectasis. Left lung is clear.  No pneumothorax. Mild cardiomegaly.  Thoracic aortic atherosclerosis. Median sternotomy. IMPRESSION: Small right pleural effusion with associated lower lobe scarring and pleural thickening, chronic. Electronically Signed   By: Julian Hy M.D.   On: 08/11/2020 14:06   DG Knee Complete 4 Views Left  Result Date: 08/11/2020 CLINICAL DATA:  Fall with pain. EXAM: LEFT KNEE - COMPLETE 4+ VIEW COMPARISON:  None. FINDINGS: No evidence of fracture or dislocation. No joint effusion. Regional arterial calcification incidentally noted. IMPRESSION: No acute finding. Electronically Signed   By: Nelson Chimes M.D.   On: 08/11/2020 14:06   DG Hip Unilat W or Wo Pelvis 2-3 Views Left  Result Date: 08/11/2020 CLINICAL DATA:  Fall with left-sided pain. EXAM: DG HIP (WITH OR WITHOUT PELVIS) 2-3V LEFT COMPARISON:  None. FINDINGS: Angulated femoral neck fracture on the left. No intertrochanteric component. Pelvic bones appear intact. IMPRESSION: Angulated fracture of the left femoral neck. Electronically Signed   By: Nelson Chimes M.D.   On: 08/11/2020 14:08    Review of Systems  HENT: Negative for ear discharge, ear pain, hearing loss and tinnitus.   Eyes: Negative for photophobia and pain.  Respiratory: Negative for cough and shortness of breath.   Cardiovascular: Negative for chest pain.  Gastrointestinal: Negative for abdominal pain, nausea and vomiting.  Genitourinary: Negative for dysuria, flank pain, frequency and urgency.  Musculoskeletal: Positive for arthralgias (LLE). Negative for back pain, myalgias and neck pain.   Neurological: Negative for dizziness and headaches.  Hematological: Does not bruise/bleed easily.  Psychiatric/Behavioral: The patient is not nervous/anxious.    Blood pressure (!) 123/51, pulse 95, temperature 98.7 F (37.1 C), temperature source Oral, resp. rate 18, height '5\' 7"'$  (1.702 m), weight 52.2 kg, SpO2 98 %. Physical Exam Constitutional:      General: She is not in acute distress.    Appearance: She is well-developed. She is not diaphoretic.  HENT:     Head: Normocephalic and atraumatic.  Eyes:     General: No scleral icterus.       Right eye: No discharge.        Left eye: No discharge.     Conjunctiva/sclera: Conjunctivae normal.  Cardiovascular:     Rate and Rhythm: Normal rate and regular rhythm.  Pulmonary:     Effort: Pulmonary effort is normal. No respiratory distress.  Musculoskeletal:     Cervical back: Normal range of motion.     Comments: LLE Ant knee abrasion, no ecchymosis or rash  Mod TTP hip/knee/ankle  No knee or ankle effusion  Knee stable to varus/ valgus and anterior/posterior stress  Sens DPN, SPN, TN intact  Motor EHL, ext, flex, evers 5/5  DP 2+, PT 1+, No significant  edema  Skin:    General: Skin is warm and dry.  Neurological:     Mental Status: She is alert.  Psychiatric:        Behavior: Behavior normal.     Assessment/Plan: Left hip fx -- Plan hip hemi tomorrow with Dr. Erlinda Hong. Please keep NPO after MN. Left ankle fx -- Should be able to WBAT in a CAM boot. Multiple medical problems including persistent atrial fibrillation, chronic diastolic CHF, recurrent pleural effusions, mechanical aortic valve replacement 1995 on anticoagulation, HTN, HLD, OSA, cirrhosis, orthostatic hypotension, history of CVA, DM type II -- per primary service    Lisette Abu, PA-C Orthopedic Surgery [phone] 08/12/2020, 9:05 AM

## 2020-08-12 NOTE — Progress Notes (Signed)
Brief cardiology progress notes:  Echo performed, no significant change from prior, no evidence of shunting. See overnight note for preoperative evaluation.  Scheduled for surgery tomorrow, cardiology will follow up postoperatively but call sooner if questions arise.  Buford Dresser, MD, PhD, [place]  [address] [place], [place] [address] [phone]

## 2020-08-12 NOTE — Progress Notes (Signed)
[place] for heparin gtt  Indication: mechanical valve INR 2.2 surgery tomorrow  Allergies  Allergen Reactions  . Tape Itching    Redness, Please use "paper" tape only    Patient Measurements: Height: '5\' 7"'$  (170.2 cm) Weight: 52.2 kg (115 lb) IBW/kg (Calculated) : 61.6   Vital Signs: Temp: 97.8 F (36.6 C) (05/25 2020) Temp Source: Oral (05/25 2020) BP: 118/48 (05/25 2020) Pulse Rate: 95 (05/25 2020)  Labs: Recent Labs    08/11/20 1509 08/12/20 0049  HGB 11.8*  --   HCT 38.4  --   PLT 176  --   LABPROT 24.2* 25.3*  INR 2.2* 2.3*  CREATININE 1.31*  --     Estimated Creatinine Clearance: 27.3 mL/min (A) (by C-G formula based on SCr of 1.31 mg/dL (H)).  Assessment: Mary [name] a 80 y.o. female requires anticoagulation with a heparin iv infusion for the indication of  mechanical valve. Heparin gtt will be started following pharmacy protocol per pharmacy consult. Patient is not on previous oral anticoagulant that will require aPTT/HL correlation before transitioning to only HL monitoring.   No bolus per physician, just bridging patient overnight until surgery tomorrow. Conservative heparin per MD.   Heparin level undetectable on gtt at 700 units/hr. No issues with line or bleeding reported per RN.  Goal of Therapy:  Heparin level 0.3-0.7 units/ml Monitor platelets by anticoagulation protocol: Yes   Plan:  Increase heparin to 850 units/hr F/u post surgery - scheduled at [place], PharmD, BCPS Please see amion for complete clinical pharmacist phone list 08/12/2020,1:18 AM

## 2020-08-12 NOTE — Progress Notes (Signed)
Pt arrived to 5N[facility] from [facility]. She is alert and oriented x4. Skin assessment completed with charge nurse Blanch Media, RN. VSS at this time. She does not appear to be in any distress. Tele box placed on patient and purewick set up. Will continue to monitor.

## 2020-08-13 ENCOUNTER — Encounter (HOSPITAL_COMMUNITY): Payer: Self-pay | Admitting: Internal Medicine

## 2020-08-13 ENCOUNTER — Inpatient Hospital Stay (HOSPITAL_COMMUNITY): Payer: Medicare Other | Admitting: Anesthesiology

## 2020-08-13 ENCOUNTER — Inpatient Hospital Stay (HOSPITAL_COMMUNITY): Payer: Medicare Other

## 2020-08-13 ENCOUNTER — Encounter (HOSPITAL_COMMUNITY)
Admission: EM | Disposition: A | Discharge status: Skilled Nursing Facility | Payer: Self-pay | Source: Home / Self Care | Attending: Internal Medicine

## 2020-08-13 DIAGNOSIS — S72002A Fracture of unspecified part of neck of left femur, initial encounter for closed fracture: Secondary | ICD-10-CM | POA: Diagnosis not present

## 2020-08-13 DIAGNOSIS — E440 Moderate protein-calorie malnutrition: Secondary | ICD-10-CM | POA: Insufficient documentation

## 2020-08-13 HISTORY — PX: ANTERIOR APPROACH HEMI HIP ARTHROPLASTY: SHX6690

## 2020-08-13 LAB — BPAM FFP
Blood Product Expiration Date: 202205292359
ISSUE DATE / TIME: 202205261138
Unit Type and Rh: 6200

## 2020-08-13 LAB — PREPARE FRESH FROZEN PLASMA: Unit division: 0

## 2020-08-13 LAB — CBC
HCT: 30.1 % — ABNORMAL LOW (ref 36.0–46.0)
HCT: 31.7 % — ABNORMAL LOW (ref 36.0–46.0)
Hemoglobin: 9.8 g/dL — ABNORMAL LOW (ref 12.0–15.0)
Hemoglobin: 10 g/dL — ABNORMAL LOW (ref 12.0–15.0)
MCH: 34.4 pg — ABNORMAL HIGH (ref 26.0–34.0)
MCH: 33.9 pg (ref 26.0–34.0)
MCHC: 32.6 g/dL (ref 30.0–36.0)
MCHC: 31.5 g/dL (ref 30.0–36.0)
MCV: 105.6 fL — ABNORMAL HIGH (ref 80.0–100.0)
MCV: 107.5 fL — ABNORMAL HIGH (ref 80.0–100.0)
Platelets: 137 10*3/uL — ABNORMAL LOW (ref 150–400)
Platelets: 146 10*3/uL — ABNORMAL LOW (ref 150–400)
RBC: 2.85 MIL/uL — ABNORMAL LOW (ref 3.87–5.11)
RBC: 2.95 MIL/uL — ABNORMAL LOW (ref 3.87–5.11)
RDW: 14.8 % (ref 11.5–15.5)
RDW: 14.8 % (ref 11.5–15.5)
WBC: 6.9 10*3/uL (ref 4.0–10.5)
WBC: 6.6 10*3/uL (ref 4.0–10.5)
nRBC: 0 % (ref 0.0–0.2)
nRBC: 0 % (ref 0.0–0.2)

## 2020-08-13 LAB — HEPARIN LEVEL (UNFRACTIONATED)
Heparin Unfractionated: 0.32 IU/mL (ref 0.30–0.70)
Heparin Unfractionated: 0.31 IU/mL (ref 0.30–0.70)

## 2020-08-13 LAB — CREATININE, SERUM
Creatinine, Ser: 1.62 mg/dL — ABNORMAL HIGH (ref 0.44–1.00)
GFR, Estimated: 32 mL/min — ABNORMAL LOW (ref >60)

## 2020-08-13 LAB — PREPARE RBC (CROSSMATCH)

## 2020-08-13 LAB — GLUCOSE, CAPILLARY
Glucose-Capillary: 97 mg/dL (ref 70–99)
Glucose-Capillary: 100 mg/dL — ABNORMAL HIGH (ref 70–99)

## 2020-08-13 SURGERY — HEMIARTHROPLASTY, HIP, DIRECT ANTERIOR APPROACH, FOR FRACTURE
Anesthesia: General | Site: Hip | Laterality: Left

## 2020-08-13 MED ORDER — CEFAZOLIN SODIUM-DEXTROSE 2-4 GM/100ML-% IV SOLN
2.0000 g | Freq: Two times a day (BID) | INTRAVENOUS | Status: AC
  Administered 2020-08-14 (×2): 2 g via INTRAVENOUS
  Filled 2020-08-13 (×2): qty 100

## 2020-08-13 MED ORDER — ONDANSETRON HCL 4 MG/2ML IJ SOLN: 4.0000 mg | Freq: Four times a day (QID) | INTRAMUSCULAR | Status: DC | PRN

## 2020-08-13 MED ORDER — HYDROMORPHONE HCL 1 MG/ML IJ SOLN
0.5000 mg | INTRAMUSCULAR | Status: DC | PRN
  Administered 2020-08-13: 0.5 mg via INTRAVENOUS
  Filled 2020-08-13: qty 1

## 2020-08-13 MED ORDER — SORBITOL 70 % SOLN
30.0000 mL | Freq: Every day | Status: DC | PRN
  Filled 2020-08-13: qty 30

## 2020-08-13 MED ORDER — OXYCODONE HCL 5 MG/5ML PO SOLN: 5.0000 mg | Freq: Once | ORAL | Status: DC | PRN

## 2020-08-13 MED ORDER — ENOXAPARIN SODIUM 40 MG/0.4ML IJ SOSY: 40.0000 mg | PREFILLED_SYRINGE | INTRAMUSCULAR | Status: DC

## 2020-08-13 MED ORDER — 0.9 % SODIUM CHLORIDE (POUR BTL) OPTIME
TOPICAL | Status: DC | PRN
  Administered 2020-08-13: 1000 mL

## 2020-08-13 MED ORDER — WARFARIN - PHARMACIST DOSING INPATIENT: Freq: Every day | Status: DC

## 2020-08-13 MED ORDER — DEXAMETHASONE SODIUM PHOSPHATE 10 MG/ML IJ SOLN
INTRAMUSCULAR | Status: AC
  Filled 2020-08-13: qty 1

## 2020-08-13 MED ORDER — ENOXAPARIN SODIUM 30 MG/0.3ML IJ SOSY
30.0000 mg | PREFILLED_SYRINGE | INTRAMUSCULAR | Status: DC
  Administered 2020-08-14: 30 mg via SUBCUTANEOUS
  Filled 2020-08-13: qty 0.3

## 2020-08-13 MED ORDER — CHLORHEXIDINE GLUCONATE 0.12 % MT SOLN
15.0000 mL | OROMUCOSAL | Status: AC
  Administered 2020-08-13: 15 mL via OROMUCOSAL
  Filled 2020-08-13: qty 15

## 2020-08-13 MED ORDER — VITAMIN K1 10 MG/ML IJ SOLN
10.0000 mg | Freq: Once | INTRAVENOUS | Status: DC
  Filled 2020-08-13: qty 1

## 2020-08-13 MED ORDER — PHENOL 1.4 % MT LIQD: 1.0000 | OROMUCOSAL | Status: DC | PRN

## 2020-08-13 MED ORDER — ONDANSETRON HCL 4 MG/2ML IJ SOLN
INTRAMUSCULAR | Status: DC | PRN
  Administered 2020-08-13: 4 mg via INTRAVENOUS

## 2020-08-13 MED ORDER — FENTANYL CITRATE (PF) 100 MCG/2ML IJ SOLN: 25.0000 ug | INTRAMUSCULAR | Status: DC | PRN

## 2020-08-13 MED ORDER — LIDOCAINE 2% (20 MG/ML) 5 ML SYRINGE
INTRAMUSCULAR | Status: AC
  Filled 2020-08-13: qty 15

## 2020-08-13 MED ORDER — OXYCODONE HCL 5 MG PO TABS
10.0000 mg | ORAL_TABLET | ORAL | Status: DC | PRN
  Administered 2020-08-15 – 2020-08-17 (×2): 10 mg via ORAL

## 2020-08-13 MED ORDER — SODIUM CHLORIDE 0.9% IV SOLUTION: Freq: Once | INTRAVENOUS | Status: DC

## 2020-08-13 MED ORDER — OXYCODONE-ACETAMINOPHEN 5-325 MG PO TABS: 1.0000 | ORAL_TABLET | Freq: Three times a day (TID) | ORAL | 0 refills | Status: DC | PRN

## 2020-08-13 MED ORDER — OXYCODONE HCL 5 MG PO TABS
5.0000 mg | ORAL_TABLET | ORAL | Status: DC | PRN
  Administered 2020-08-16 – 2020-08-18 (×5): 10 mg via ORAL
  Filled 2020-08-13 (×7): qty 2

## 2020-08-13 MED ORDER — ONDANSETRON HCL 4 MG PO TABS: 4.0000 mg | ORAL_TABLET | Freq: Four times a day (QID) | ORAL | Status: DC | PRN

## 2020-08-13 MED ORDER — PROPOFOL 10 MG/ML IV BOLUS
INTRAVENOUS | Status: DC | PRN
  Administered 2020-08-13: 80 mg via INTRAVENOUS

## 2020-08-13 MED ORDER — ONDANSETRON HCL 4 MG/2ML IJ SOLN
INTRAMUSCULAR | Status: AC
  Filled 2020-08-13: qty 4

## 2020-08-13 MED ORDER — ROCURONIUM BROMIDE 100 MG/10ML IV SOLN
INTRAVENOUS | Status: DC | PRN
  Administered 2020-08-13: 50 mg via INTRAVENOUS

## 2020-08-13 MED ORDER — LIDOCAINE HCL (CARDIAC) PF 100 MG/5ML IV SOSY
PREFILLED_SYRINGE | INTRAVENOUS | Status: DC | PRN
  Administered 2020-08-13: 30 mg via INTRAVENOUS

## 2020-08-13 MED ORDER — DOCUSATE SODIUM 100 MG PO CAPS
100.0000 mg | ORAL_CAPSULE | Freq: Two times a day (BID) | ORAL | Status: DC
  Administered 2020-08-13 – 2020-08-19 (×12): 100 mg via ORAL
  Filled 2020-08-13 (×12): qty 1

## 2020-08-13 MED ORDER — FENTANYL CITRATE (PF) 100 MCG/2ML IJ SOLN
INTRAMUSCULAR | Status: DC | PRN
  Administered 2020-08-13: 100 ug via INTRAVENOUS
  Administered 2020-08-13: 50 ug via INTRAVENOUS

## 2020-08-13 MED ORDER — POLYETHYLENE GLYCOL 3350 17 G PO PACK: 17.0000 g | PACK | Freq: Every day | ORAL | Status: DC | PRN

## 2020-08-13 MED ORDER — ONDANSETRON HCL 4 MG/2ML IJ SOLN
4.0000 mg | Freq: Once | INTRAMUSCULAR | Status: AC | PRN
  Administered 2020-08-13: 4 mg via INTRAVENOUS

## 2020-08-13 MED ORDER — TRANEXAMIC ACID-NACL 1000-0.7 MG/100ML-% IV SOLN
1000.0000 mg | Freq: Once | INTRAVENOUS | Status: AC
  Administered 2020-08-13: 1000 mg via INTRAVENOUS
  Filled 2020-08-13: qty 100

## 2020-08-13 MED ORDER — PHENYLEPHRINE HCL-NACL 10-0.9 MG/250ML-% IV SOLN
INTRAVENOUS | Status: DC | PRN
  Administered 2020-08-13: 25 ug/min via INTRAVENOUS

## 2020-08-13 MED ORDER — TRANEXAMIC ACID 1000 MG/10ML IV SOLN
INTRAVENOUS | Status: DC | PRN
  Administered 2020-08-13: 2000 mg via TOPICAL

## 2020-08-13 MED ORDER — METHOCARBAMOL 1000 MG/10ML IJ SOLN
500.0000 mg | Freq: Four times a day (QID) | INTRAMUSCULAR | Status: DC | PRN
  Filled 2020-08-13: qty 5

## 2020-08-13 MED ORDER — PHENYLEPHRINE HCL (PRESSORS) 10 MG/ML IV SOLN
INTRAVENOUS | Status: DC | PRN
  Administered 2020-08-13: 50 ug via INTRAVENOUS

## 2020-08-13 MED ORDER — IRRISEPT - 450ML BOTTLE WITH 0.05% CHG IN STERILE WATER, USP 99.95% OPTIME
TOPICAL | Status: DC | PRN
  Administered 2020-08-13: 450 mL via TOPICAL

## 2020-08-13 MED ORDER — VANCOMYCIN HCL 1000 MG IV SOLR
INTRAVENOUS | Status: AC
  Filled 2020-08-13: qty 1000

## 2020-08-13 MED ORDER — BUPIVACAINE-MELOXICAM ER 400-12 MG/14ML IJ SOLN
INTRAMUSCULAR | Status: AC
  Filled 2020-08-13: qty 1

## 2020-08-13 MED ORDER — LACTATED RINGERS IV SOLN: INTRAVENOUS | Status: DC

## 2020-08-13 MED ORDER — FENTANYL CITRATE (PF) 250 MCG/5ML IJ SOLN
INTRAMUSCULAR | Status: AC
  Filled 2020-08-13: qty 5

## 2020-08-13 MED ORDER — LACTATED RINGERS IV SOLN: INTRAVENOUS | Status: DC | PRN

## 2020-08-13 MED ORDER — OXYCODONE HCL 5 MG PO TABS: 5.0000 mg | ORAL_TABLET | Freq: Once | ORAL | Status: DC | PRN

## 2020-08-13 MED ORDER — PHENYLEPHRINE 40 MCG/ML (10ML) SYRINGE FOR IV PUSH (FOR BLOOD PRESSURE SUPPORT)
PREFILLED_SYRINGE | INTRAVENOUS | Status: AC
  Filled 2020-08-13: qty 10

## 2020-08-13 MED ORDER — METHOCARBAMOL 500 MG PO TABS
500.0000 mg | ORAL_TABLET | Freq: Four times a day (QID) | ORAL | Status: DC | PRN
  Administered 2020-08-15 – 2020-08-18 (×6): 500 mg via ORAL
  Filled 2020-08-13 (×6): qty 1

## 2020-08-13 MED ORDER — MENTHOL 3 MG MT LOZG: 1.0000 | LOZENGE | OROMUCOSAL | Status: DC | PRN

## 2020-08-13 MED ORDER — ALUM & MAG HYDROXIDE-SIMETH 200-200-20 MG/5ML PO SUSP: 30.0000 mL | ORAL | Status: DC | PRN

## 2020-08-13 MED ORDER — ACETAMINOPHEN 500 MG PO TABS
1000.0000 mg | ORAL_TABLET | Freq: Four times a day (QID) | ORAL | Status: AC
  Administered 2020-08-14 (×3): 1000 mg via ORAL
  Filled 2020-08-13 (×3): qty 2

## 2020-08-13 MED ORDER — CHLORHEXIDINE GLUCONATE CLOTH 2 % EX PADS
6.0000 | MEDICATED_PAD | Freq: Every day | CUTANEOUS | Status: DC
  Administered 2020-08-13: 6 via TOPICAL

## 2020-08-13 MED ORDER — WARFARIN SODIUM 5 MG PO TABS
5.0000 mg | ORAL_TABLET | Freq: Once | ORAL | Status: AC
  Administered 2020-08-13: 5 mg via ORAL
  Filled 2020-08-13 (×2): qty 1

## 2020-08-13 MED ORDER — PROPOFOL 10 MG/ML IV BOLUS
INTRAVENOUS | Status: AC
  Filled 2020-08-13: qty 20

## 2020-08-13 MED ORDER — MAGNESIUM CITRATE PO SOLN: 1.0000 | Freq: Once | ORAL | Status: DC | PRN

## 2020-08-13 MED ORDER — SODIUM CHLORIDE 0.9 % IV SOLN: INTRAVENOUS | Status: DC

## 2020-08-13 MED ORDER — SUGAMMADEX SODIUM 200 MG/2ML IV SOLN
INTRAVENOUS | Status: DC | PRN
  Administered 2020-08-13: 200 mg via INTRAVENOUS

## 2020-08-13 MED ORDER — ROCURONIUM BROMIDE 10 MG/ML (PF) SYRINGE
PREFILLED_SYRINGE | INTRAVENOUS | Status: AC
  Filled 2020-08-13: qty 30

## 2020-08-13 MED ORDER — ACETAMINOPHEN 325 MG PO TABS
325.0000 mg | ORAL_TABLET | Freq: Four times a day (QID) | ORAL | Status: DC | PRN
  Administered 2020-08-15 – 2020-08-16 (×3): 650 mg via ORAL
  Filled 2020-08-13 (×3): qty 2

## 2020-08-13 MED ORDER — SODIUM CHLORIDE 0.9 % IR SOLN
Status: DC | PRN
  Administered 2020-08-13: 1000 mL

## 2020-08-13 MED ORDER — VANCOMYCIN HCL 1 G IV SOLR
INTRAVENOUS | Status: DC | PRN
  Administered 2020-08-13: 1000 mg

## 2020-08-13 MED ORDER — ONDANSETRON HCL 4 MG/2ML IJ SOLN
INTRAMUSCULAR | Status: AC
  Filled 2020-08-13: qty 2

## 2020-08-13 MED ORDER — EPHEDRINE 5 MG/ML INJ
INTRAVENOUS | Status: AC
  Filled 2020-08-13: qty 10

## 2020-08-13 SURGICAL SUPPLY — 60 items
BAG DECANTER FOR FLEXI CONT (MISCELLANEOUS) ×2 IMPLANT
BIPOLAR PROS AML 46 (Hips) ×2 IMPLANT
CELLS DAT CNTRL 66122 CELL SVR (MISCELLANEOUS) IMPLANT
COVER PERINEAL POST (MISCELLANEOUS) ×2 IMPLANT
COVER SURGICAL LIGHT HANDLE (MISCELLANEOUS) ×2 IMPLANT
COVER WAND RF STERILE (DRAPES) ×1 IMPLANT
DRAPE C-ARM 42X72 X-RAY (DRAPES) ×2 IMPLANT
DRAPE POUCH INSTRU U-SHP 10X18 (DRAPES) ×2 IMPLANT
DRAPE STERI IOBAN 125X83 (DRAPES) ×2 IMPLANT
DRAPE U-SHAPE 47X51 STRL (DRAPES) ×4 IMPLANT
DRSG AQUACEL AG ADV 3.5X10 (GAUZE/BANDAGES/DRESSINGS) ×2 IMPLANT
DURAPREP 26ML APPLICATOR (WOUND CARE) ×4 IMPLANT
ELECT BLADE 4.0 EZ CLEAN MEGAD (MISCELLANEOUS) ×2
ELECT REM PT RETURN 9FT ADLT (ELECTROSURGICAL) ×2
ELECTRODE BLDE 4.0 EZ CLN MEGD (MISCELLANEOUS) ×1 IMPLANT
ELECTRODE REM PT RTRN 9FT ADLT (ELECTROSURGICAL) ×1 IMPLANT
GLOVE ECLIPSE 7.0 STRL STRAW (GLOVE) ×4 IMPLANT
GLOVE SKINSENSE NS SZ7.5 (GLOVE) ×1
GLOVE SKINSENSE STRL SZ7.5 (GLOVE) ×1 IMPLANT
GLOVE SURG SYN 7.5  E (GLOVE) ×8
GLOVE SURG SYN 7.5 E (GLOVE) ×4 IMPLANT
GLOVE SURG SYN 7.5 PF PI (GLOVE) ×4 IMPLANT
GLOVE SURG UNDER POLY LF SZ7 (GLOVE) ×2 IMPLANT
GOWN STRL REIN XL XLG (GOWN DISPOSABLE) ×2 IMPLANT
GOWN STRL REUS W/ TWL LRG LVL3 (GOWN DISPOSABLE) IMPLANT
GOWN STRL REUS W/ TWL XL LVL3 (GOWN DISPOSABLE) ×1 IMPLANT
GOWN STRL REUS W/TWL LRG LVL3 (GOWN DISPOSABLE)
GOWN STRL REUS W/TWL XL LVL3 (GOWN DISPOSABLE) ×2
HANDPIECE INTERPULSE COAX TIP (DISPOSABLE) ×2
HEAD BIPOLAR PROS AML 46 (Hips) IMPLANT
HEAD FEM STD 28X+1.5 STRL (Hips) ×1 IMPLANT
HOOD PEEL AWAY FLYTE STAYCOOL (MISCELLANEOUS) ×5 IMPLANT
IV NS IRRIG 3000ML ARTHROMATIC (IV SOLUTION) ×2 IMPLANT
JET LAVAGE IRRISEPT WOUND (IRRIGATION / IRRIGATOR) ×2
KIT BASIN OR (CUSTOM PROCEDURE TRAY) ×2 IMPLANT
LAVAGE JET IRRISEPT WOUND (IRRIGATION / IRRIGATOR) ×1 IMPLANT
MARKER SKIN DUAL TIP RULER LAB (MISCELLANEOUS) ×2 IMPLANT
NDL SPNL 18GX3.5 QUINCKE PK (NEEDLE) ×1 IMPLANT
NEEDLE SPNL 18GX3.5 QUINCKE PK (NEEDLE) ×2 IMPLANT
PACK TOTAL JOINT (CUSTOM PROCEDURE TRAY) ×2 IMPLANT
PACK UNIVERSAL I (CUSTOM PROCEDURE TRAY) ×2 IMPLANT
RETRACTOR WND ALEXIS 18 MED (MISCELLANEOUS) IMPLANT
RTRCTR WOUND ALEXIS 18CM MED (MISCELLANEOUS)
SAW OSC TIP CART 19.5X105X1.3 (SAW) ×2 IMPLANT
SET HNDPC FAN SPRY TIP SCT (DISPOSABLE) ×1 IMPLANT
STAPLER VISISTAT 35W (STAPLE) IMPLANT
STEM FEM ACTIS STD SZ4 (Stem) ×1 IMPLANT
SUT ETHIBOND 2 V 37 (SUTURE) ×2 IMPLANT
SUT VIC AB 0 CT1 27 (SUTURE) ×2
SUT VIC AB 0 CT1 27XBRD ANBCTR (SUTURE) ×1 IMPLANT
SUT VIC AB 1 CTX 36 (SUTURE) ×2
SUT VIC AB 1 CTX36XBRD ANBCTR (SUTURE) ×1 IMPLANT
SUT VIC AB 2-0 CT1 27 (SUTURE) ×4
SUT VIC AB 2-0 CT1 TAPERPNT 27 (SUTURE) ×2 IMPLANT
SYR 50ML LL SCALE MARK (SYRINGE) ×2 IMPLANT
TOWEL GREEN STERILE (TOWEL DISPOSABLE) ×2 IMPLANT
TRAY CATH 16FR W/PLASTIC CATH (SET/KITS/TRAYS/PACK) ×1 IMPLANT
TRAY FOLEY W/BAG SLVR 16FR (SET/KITS/TRAYS/PACK)
TRAY FOLEY W/BAG SLVR 16FR ST (SET/KITS/TRAYS/PACK) ×1 IMPLANT
YANKAUER SUCT BULB TIP NO VENT (SUCTIONS) ×2 IMPLANT

## 2020-08-13 NOTE — Anesthesia Procedure Notes (Signed)
Arterial Line Insertion Start/End5/27/2022 11:45 AM, 08/13/2020 11:55 AM Performed by: Eligha Bridegroom, CRNA, CRNA  Preanesthetic checklist: patient identified, risks and benefits discussed, surgical consent, monitors and equipment checked, pre-op evaluation and timeout performed Lidocaine 1% used for infiltration Left, radial was placed Catheter size: 20 G Hand hygiene performed  and maximum sterile barriers used   Attempts: 1 Procedure performed without using ultrasound guided technique. Following insertion, Biopatch. Post procedure assessment: normal

## 2020-08-13 NOTE — Progress Notes (Signed)
PROGRESS NOTE    Mary [name]  [id number] DOB: [birth year] DOA: 08/11/2020 PCP: Bonnita Nasuti, MD   Chief Complaint  Patient presents with  . Fall  Brief Narrative: 80 year old female with persistent A. fib, chronic diastolic CHF, recurrent pleural effusion, mechanical aortic valve in 1995 on Coumadin, HTN, HLD, OSA, psoriasis, orthostatic hypotension, history of CVA, T2DM lives independently at [facility] presented after a fall and is having hip pain after she tripped over the cord while vacuuming her floor and fell onto her left side.  She had no loss of consciousness.  She was seen in the ED INR 2.2, creatinine 1.3 hemoglobin 11.8, x-ray showed left hip fracture.  Anesthesia had any pain advised transferred to [facility] for cardiology evaluation Dr. Erlinda Hong from orthopedic was contacted and transferred. Seen by cardiology in preop evaluation echocardiogram no significant changes and okay to proceed with surgery 5/27. Patient was bridged to heparin preoperatively.  Subjective: Seen this morning.  Husband at the bedside.  Pain is controlled does not feel very well this morning but ready for surgery today.    Assessment & Plan:  Left hip fracture/posterior malleolar fracture secondary to mechanical fall: Seen by orthopedic planning for operative intervention of the left hip and nonoperative management of the malleolar fracture after cardiology clearance.  Echo p no significant change from prior echo and patient going for surgery today.   Continue pain control with oral and IV opiates.  High risk for intubation/CVA so cardiology strongly encouraged regional anesthesia, along with further recommendation (fluid, as needed IV Lasix, dobutamine for RV support in case of perioperative cardiogenic shock.  Defer to cardio/orthopedics.  Mechanical AVR since 1995 on long-term anticoagulation with Coumadin: Heart rate is controlled, continue with heparin bridge resume Coumadin once okay with orthopedics.   Being bridged with heparin holding Coumadin.  Continue as per cardiology pharmacy managing.   Persistent A. fib longstanding heart rate goal less than 120 avoiding CCB or BB, can use  a single dose of digoxin  for acute rate control along with beta-blocker p.o. every 6 hours  If needed  and uptitrate as needed okay to use short-term amiodarone infusion if blood pressure limits use of beta-blocker  History of combined systolic and diastolic CHF/mild pulmonary hypertension-plan per cardiology see above, echo no new changes.  Volume status is stable, on Demadex.  T2DM: blood sugar well controlled A1c 5.1 in 2021 Recent Labs  Lab 08/11/20 1914 08/13/20 1045  GLUCAP 99 97   Chronic anemia downtrending monitor closely in the setting of Coumadin Heparin May need transfusion Recent Labs  Lab 08/11/20 1509 08/12/20 0049 08/13/20 0022  HGB 11.8* 10.4* 9.8*  HCT 38.4 31.5* 30.1*   OSA noncompliant with CPAP  HLD continue simvastatin History of CVA-stable.  Anticoagulation as above Cirrhosis-stable CKD stage IIIb monitor renal function.  Renal function overall stable. Baseline creatinine as high as 2 in the past. Recent Labs    11/01/19 0703 11/02/19 0706 12/08/19 0042 01/15/20 1325 01/16/20 0455 01/17/20 0600 02/11/20 1311 02/18/20 1830 08/11/20 1509 08/12/20 1902  BUN 45* 45* 37* 45* 31* 23 26* 22 24* 30*  CREATININE 1.78* 1.58* 1.35* 2.01* 1.39* 1.16* 1.55* 1.26* 1.31* 1.82*   Moderate protein calorie malnutrition - augment diet as below dietitian on board Nutrition Problem: Moderate Malnutrition Etiology: social / environmental circumstances Signs/Symptoms: energy intake < or equal to 75% for > or equal to 1 month,mild fat depletion,mild muscle depletion Interventions: Ensure Enlive (each supplement provides 350kcal and 20 grams  of protein),MVI   Diet Order            Diet NPO time specified Except for: Sips with Meds  Diet effective midnight                  Patient's Body mass index is 18.01 kg/m.  DVT prophylaxis: Sequential compression device to OR Start: 08/12/20 1832heparin gtt Code Status:   Code Status: Full Code  Family Communication: plan of care discussed with patient and her husband at bedside.  Status is: Inpatient  Remains inpatient appropriate because:IV treatments appropriate due to intensity of illness or inability to take PO and For management of hip fracture Dispo: The patient is from: Independent living facility at [facility]              Anticipated d/c is to: SNF              Patient currently is not medically stable to d/c.   Difficult to place patient No Unresulted Labs (From admission, onward)          Start     Ordered   08/14/20 0500  Protime-INR  (BRIDGE to warfarin (includes Lovenox or Heparin -AND- warfarin pharmacy consult))  Daily,   R     Question:  Specimen collection method  Answer:  Lab=Lab collect   08/13/20 1124   08/13/20 0140  Heparin level (unfractionated)  Daily,   R     Question:  Specimen collection method  Answer:  Lab=Lab collect   08/13/20 0140   08/12/20 0500  CBC  Daily,   R      08/11/20 1708   Signed and Held  CBC  Daily,   R     Question:  Specimen collection method  Answer:  Lab=Lab collect   Signed and Held   Signed and Held  Basic metabolic panel  Daily,   R     Question:  Specimen collection method  Answer:  Lab=Lab collect   Signed and Held   Signed and Held  CBC  (BRIDGE to warfarin (includes Lovenox or Heparin -AND- warfarin pharmacy consult))  Once,   R       Comments: Baseline for enoxaparin therapy IF NOT ALREADY DRAWN. Notify MD if PLT < 100 K.   Question:  Specimen collection method  Answer:  Lab=Lab collect   Signed and Held   Signed and Held  Creatinine, serum  (BRIDGE to warfarin (includes Lovenox or Heparin -AND- warfarin pharmacy consult))  Once,   R       Comments: Baseline for enoxaparin therapy IF NOT ALREADY DRAWN.   Question:  Specimen collection method   Answer:  Lab=Lab collect   Signed and Held   Signed and Held  Creatinine, serum  (BRIDGE to warfarin (includes Lovenox or Heparin -AND- warfarin pharmacy consult))  Weekly,   R     Comments: while on enoxaparin therapy.   Question:  Specimen collection method  Answer:  Lab=Lab collect   Signed and Held         Medications reviewed:  Scheduled Meds: . [MAR Hold] sodium chloride   Intravenous Once  . [MAR Hold] buPROPion  150 mg Oral Daily  . [MAR Hold] busPIRone  5 mg Oral BID  . [MAR Hold] docusate sodium  100 mg Oral BID  . [MAR Hold] feeding supplement  237 mL Oral BID BM  . [MAR Hold] lactulose  20 g Oral Daily  . [MAR Hold] multivitamin with minerals  1 tablet Oral Daily  . [MAR Hold] pantoprazole  40 mg Oral QAC breakfast  . [MAR Hold] torsemide  20 mg Oral Daily  . tranexamic acid (CYKLOKAPRON) topical - INTRAOP  2,000 mg Topical To OR   Continuous Infusions: .  ceFAZolin (ANCEF) IV    . lactated ringers 10 mL/hr at 08/13/20 1044  . tranexamic acid      Consultants:see note  Procedures:see note  Antimicrobials: Anti-infectives (From admission, onward)   Start     Dose/Rate Route Frequency Ordered Stop   08/13/20 0600  ceFAZolin (ANCEF) IVPB 2g/100 mL premix        2 g 200 mL/hr over 30 Minutes Intravenous On call to O.R. 08/12/20 1831 08/14/20 0559     Culture/Microbiology    Component Value Date/Time   SDES PERITONEAL ABDOMEN 10/31/2019 1657   [place] 10/31/2019 1657   SPECREQUEST BOTTLES DRAWN AEROBIC AND ANAEROBIC 10/31/2019 1657   SPECREQUEST NONE 10/31/2019 1657   CULT  10/31/2019 1657    NO GROWTH 5 DAYS Performed at [facility], [place] [address], [place], [place] [address]    REPTSTATUS 11/05/2019 FINAL 10/31/2019 1657   REPTSTATUS 10/31/2019 FINAL 10/31/2019 1657    Other culture-see note  Objective: Vitals: Today's Vitals   08/13/20 0820 08/13/20 0827 08/13/20 1039 08/13/20 1045  BP:  (!) 127/55  (!) 143/44  Pulse:  98   100  Resp:  16  18  Temp:  98.3 F (36.8 C)  98.3 F (36.8 C)  TempSrc:  Oral  Oral  SpO2:  97%  97%  Weight:   52.2 kg   Height:   '5\' 7"'$  (1.702 m)   PainSc: 6        Intake/Output Summary (Last 24 hours) at 08/13/2020 1137 Last data filed at 08/13/2020 0300 Gross per 24 hour  Intake 565.95 ml  Output 800 ml  Net -234.05 ml   Filed Weights   08/11/20 1126 08/13/20 1039  Weight: 52.2 kg 52.2 kg   Weight change:   Intake/Output from previous day: 05/26 0701 - 05/27 0700 In: 566 [I.V.:201; Blood:315; IV Piggyback:50] Out: 800 [Urine:800] Intake/Output this shift: No intake/output data recorded. Filed Weights   08/11/20 1126 08/13/20 1039  Weight: 52.2 kg 52.2 kg    Examination: General exam: AAOx 3, elderly lady, frail.  HEENT:Oral mucosa moist, Ear/Nose WNL grossly, dentition normal. Respiratory system: bilaterally diminished, no use of accessory muscle Cardiovascular system: S1 & S2 +, No JVD,. Gastrointestinal system: Abdomen soft,NT,ND, BS+ Nervous System:Alert, awake, moving extremities, tender on  lft hip Extremities: no edema, distal peripheral pulses palpable.  Skin: No rashes,no icterus. MSK: thin muscle bulk,tone, power  Data Reviewed: I have personally reviewed following labs and imaging studies CBC: Recent Labs  Lab 08/11/20 1509 08/12/20 0049 08/13/20 0022  WBC 9.3 6.5 6.9  HGB 11.8* 10.4* 9.8*  HCT 38.4 31.5* 30.1*  MCV 110.0* 104.0* 105.6*  PLT 176 144* 0000000*   Basic Metabolic Panel: Recent Labs  Lab 08/11/20 1509 08/12/20 1902  NA 135 136  K 3.8 4.4  CL 100 102  CO2 25 26  GLUCOSE 100* 125*  BUN 24* 30*  CREATININE 1.31* 1.82*  CALCIUM 9.7 8.7*   GFR: Estimated Creatinine Clearance: 19.6 mL/min (A) (by C-G formula based on SCr of 1.82 mg/dL (H)). Liver Function Tests: Recent Labs  Lab 08/11/20 1509  AST 28  ALT 16  ALKPHOS 109  BILITOT 1.2  PROT 9.0*  ALBUMIN 4.0  No results for input(s): LIPASE, AMYLASE in the last 168  hours. No results for input(s): AMMONIA in the last 168 hours. Coagulation Profile: Recent Labs  Lab 08/11/20 1509 08/12/20 0049 08/12/20 2146  INR 2.2* 2.3* 1.5*   Cardiac Enzymes: No results for input(s): CKTOTAL, CKMB, CKMBINDEX, TROPONINI in the last 168 hours. BNP (last 3 results) No results for input(s): PROBNP in the last 8760 hours. HbA1C: No results for input(s): HGBA1C in the last 72 hours. CBG: Recent Labs  Lab 08/11/20 1914 08/13/20 1045  GLUCAP 99 97   Lipid Profile: No results for input(s): CHOL, HDL, LDLCALC, TRIG, CHOLHDL, LDLDIRECT in the last 72 hours. Thyroid Function Tests: No results for input(s): TSH, T4TOTAL, FREET4, T3FREE, THYROIDAB in the last 72 hours. Anemia Panel: No results for input(s): VITAMINB12, FOLATE, FERRITIN, TIBC, IRON, RETICCTPCT in the last 72 hours. Sepsis Labs: Recent Labs  Lab 08/11/20 1509  LATICACIDVEN 1.9    Recent Results (from the past 240 hour(s))  Resp Panel by RT-PCR (Flu A&B, Covid) Nasopharyngeal Swab     Status: None   Collection Time: 08/11/20 12:21 PM   Specimen: Nasopharyngeal Swab; Nasopharyngeal(NP) swabs in vial transport medium  Result Value Ref Range Status   SARS Coronavirus 2 by RT PCR NEGATIVE NEGATIVE Final    Comment: (NOTE) SARS-CoV-2 target nucleic acids are NOT DETECTED.  The SARS-CoV-2 RNA is generally detectable in upper respiratory specimens during the acute phase of infection. The lowest concentration of SARS-CoV-2 viral copies this assay can detect is 138 copies/mL. A negative result does not preclude SARS-Cov-2 infection and should not be used as the sole basis for treatment or other patient management decisions. A negative result may occur with  improper specimen collection/handling, submission of specimen other than nasopharyngeal swab, presence of viral mutation(s) within the areas targeted by this assay, and inadequate number of viral copies(<138 copies/mL). A negative result must be  combined with clinical observations, patient history, and epidemiological information. The expected result is Negative.  Fact Sheet for Patients:  EntrepreneurPulse.com.au  Fact Sheet for Healthcare Providers:  IncredibleEmployment.be  This test is no t yet approved or cleared by the [place] FDA and  has been authorized for detection and/or diagnosis of SARS-CoV-2 by FDA under an Emergency Use Authorization (EUA). This EUA will remain  in effect (meaning this test can be used) for the duration of the COVID-19 declaration under Section 564(b)(1) of the Act, 21 U.S.C.section 360bbb-3(b)(1), unless the authorization is terminated  or revoked sooner.       Influenza A by PCR NEGATIVE NEGATIVE Final   Influenza B by PCR NEGATIVE NEGATIVE Final    Comment: (NOTE) The Xpert Xpress SARS-CoV-2/FLU/RSV plus assay is intended as an aid in the diagnosis of influenza from Nasopharyngeal swab specimens and should not be used as a sole basis for treatment. Nasal washings and aspirates are unacceptable for Xpert Xpress SARS-CoV-2/FLU/RSV testing.  Fact Sheet for Patients: EntrepreneurPulse.com.au  Fact Sheet for Healthcare Providers: IncredibleEmployment.be  This test is not yet approved or cleared by the [place] FDA and has been authorized for detection and/or diagnosis of SARS-CoV-2 by FDA under an Emergency Use Authorization (EUA). This EUA will remain in effect (meaning this test can be used) for the duration of the COVID-19 declaration under Section 564(b)(1) of the Act, 21 U.S.C. section 360bbb-3(b)(1), unless the authorization is terminated or revoked.  Performed at [facility], [address] [place], [place] [address]   Surgical pcr screen     Status:  None   Collection Time: 08/12/20  1:00 AM   Specimen: Nasal Mucosa; Nasal Swab  Result Value Ref Range Status   MRSA, PCR NEGATIVE NEGATIVE  Final   Staphylococcus aureus NEGATIVE NEGATIVE Final    Comment: (NOTE) The Xpert SA Assay (FDA approved for NASAL specimens in patients 23 years of age and older), is one component of a comprehensive surveillance program. It is not intended to diagnose infection nor to guide or monitor treatment. Performed at [facility], [place] [address] [place],  [address]      Radiology Studies: DG Ankle Complete Left  Result Date: 08/11/2020 CLINICAL DATA:  Recent fall with left ankle pain, initial encounter EXAM: LEFT ANKLE COMPLETE - 3+ VIEW COMPARISON:  01/04/2020 FINDINGS: Mild soft tissue swelling is noted. There is a lucency noted posteriorly suggestive of posterior malleolar fracture without significant displacement. No other fracture is seen. Well corticated bony densities are noted adjacent to the medial malleolus consistent with prior injury. This is stable from the prior exam. No other focal abnormality is noted. IMPRESSION: Changes suggestive of posterior malleolar fracture without significant displacement. Electronically Signed   By: Inez Catalina M.D.   On: 08/11/2020 14:12   CT Head Wo Contrast  Result Date: 08/11/2020 CLINICAL DATA:  Fall with trauma to the head. EXAM: CT HEAD WITHOUT CONTRAST TECHNIQUE: Contiguous axial images were obtained from the base of the skull through the vertex without intravenous contrast. COMPARISON:  02/18/2020 FINDINGS: Brain: Age related volume loss. Mild chronic small-vessel ischemic change of the pons and cerebral hemispheric white matter. No sign of acute infarction, mass lesion, hemorrhage, hydrocephalus or extra-axial collection. Vascular: There is atherosclerotic calcification of the major vessels at the base of the brain. Skull: No skull fracture. Sinuses/Orbits: Previous functional endoscopic sinus surgery on the right. Chronic inflammatory changes of the right maxillary sinus. Orbits negative. Other: None IMPRESSION: No acute intracranial  finding. Atrophy and chronic small-vessel ischemic changes. Chronic inflammatory changes of the right maxillary sinus. Previous functional endoscopic sinus surgery. Electronically Signed   By: Nelson Chimes M.D.   On: 08/11/2020 14:08   CT Cervical Spine Wo Contrast  Result Date: 08/11/2020 CLINICAL DATA:  Poly trauma.  Fall. EXAM: CT CERVICAL SPINE WITHOUT CONTRAST TECHNIQUE: Multidetector CT imaging of the cervical spine was performed without intravenous contrast. Multiplanar CT image reconstructions were also generated. COMPARISON:  January 15, 2020. FINDINGS: Alignment: Similar alignment to the prior. Similar mild straightening without substantial sagittal subluxation. Mild widening of the C4-C5 disc space anteriorly is unchanged from the prior. Mild broad dextrocurvature. Skull base and vertebrae: Similar C5-C7 solid osseous fusion. No evidence of acute fracture. Vertebral body heights are maintained. Soft tissues and spinal canal: No prevertebral fluid or swelling. No visible canal hematoma. Disc levels: Posterior disc bulges at multiple mid upper cervical levels. At C3-C4 there is disc bulge and ligamentum flavum thickening with possibly moderate canal stenosis. Multilevel facet hypertrophy, greatest on the left at C4-C5. Upper chest: Visualized lung apices are clear. Other: Calcific atherosclerosis of the carotid arteries. IMPRESSION: 1. No evidence of acute fracture or traumatic malalignment. 2. Multiple posterior disc bulges with possibly moderate canal stenosis at C3-C4. An MRI could better evaluate the canal/cord/foramina if clinically indicated. 3. Similar osseous fusion from C5-C7. Electronically Signed   By: Margaretha Sheffield MD   On: 08/11/2020 13:52   DG Chest Port 1 View  Result Date: 08/11/2020 CLINICAL DATA:  Fall, left hip pain EXAM: PORTABLE CHEST 1 VIEW COMPARISON:  02/18/2020 FINDINGS: Small right pleural effusion with associated pleural thickening, chronic. Right lower lobe  scarring/atelectasis. Left lung is clear.  No pneumothorax. Mild cardiomegaly.  Thoracic aortic atherosclerosis. Median sternotomy. IMPRESSION: Small right pleural effusion with associated lower lobe scarring and pleural thickening, chronic. Electronically Signed   By: Julian Hy M.D.   On: 08/11/2020 14:06   DG Knee Complete 4 Views Left  Result Date: 08/11/2020 CLINICAL DATA:  Fall with pain. EXAM: LEFT KNEE - COMPLETE 4+ VIEW COMPARISON:  None. FINDINGS: No evidence of fracture or dislocation. No joint effusion. Regional arterial calcification incidentally noted. IMPRESSION: No acute finding. Electronically Signed   By: Nelson Chimes M.D.   On: 08/11/2020 14:06   ECHOCARDIOGRAM COMPLETE BUBBLE STUDY  Result Date: 08/12/2020    ECHOCARDIOGRAM REPORT   Patient Name:   Mary [name] Date of Exam: 08/12/2020 Medical Rec #:  [id number]   Height:       67.0 in Accession #:    [id number]  Weight:       115.0 lb Date of Birth:  05/15/1940   BSA:          1.598 m Patient Age:    21 years    BP:           123/51 mmHg Patient Gender: F           HR:           95 bpm. Exam Location:  Inpatient Procedure: 2D Echo STAT ECHO Indications:    right heart failure  History:        Patient has prior history of Echocardiogram examinations, most                 recent 10/23/2019. Chronic kidney disease; Risk                 Factors:Hypertension, Dyslipidemia and Sleep Apnea.                 Aortic Valve: mechanical valve is present in the aortic                 position.  Sonographer:    Johny Chess Referring Phys: [place]  1. Mechanical AoV. Vmax 1.8 m/s, MG 7 mmHG, EOA 2.45 cm2, DI 0.71. There appears to be trivial central regurgitation. Otherwise the valve is within normal limits. The aortic valve has been repaired/replaced. Aortic valve regurgitation is trivial. There is a mechanical valve present in the aortic position.  2. Right ventricular systolic function is moderately reduced. The right  ventricular size is moderately enlarged. There is moderately elevated pulmonary artery systolic pressure. The estimated right ventricular systolic pressure is [id number] mmHg.  3. The tricuspid valve is abnormal. Tricuspid valve regurgitation is severe.  4. Left ventricular ejection fraction, by estimation, is 65 to 70%. The left ventricle has normal function. The left ventricle has no regional wall motion abnormalities. Indeterminate diastolic filling due to E-A fusion. There is the interventricular septum is flattened in systole and diastole, consistent with right ventricular pressure and volume overload.  5. Left atrial size was severely dilated.  6. Right atrial size was severely dilated.  7. The mitral valve is degenerative. Moderate mitral valve regurgitation. Mild mitral stenosis. The mean mitral valve gradient is 4.9 mmHg with average heart rate of 97 bpm. Moderate mitral annular calcification.  8. The inferior vena cava is dilated in size with <50% respiratory variability, suggesting right atrial pressure of 15 mmHg.  9. Agitated saline contrast bubble study was negative, with no evidence of any interatrial shunt. Comparison(s): No significant change from prior study. FINDINGS  Left Ventricle: Left ventricular ejection fraction, by estimation, is 65 to 70%. The left ventricle has normal function. The left ventricle has no regional wall motion abnormalities. The left ventricular internal cavity size was normal in size. There is  no left ventricular hypertrophy. The interventricular septum is flattened in systole and diastole, consistent with right ventricular pressure and volume overload. Indeterminate diastolic filling due to E-A fusion. Right Ventricle: The right ventricular size is moderately enlarged. No increase in right ventricular wall thickness. Right ventricular systolic function is moderately reduced. There is moderately elevated pulmonary artery systolic pressure. The tricuspid  regurgitant velocity is  3.05 m/s, and with an assumed right atrial pressure of 15 mmHg, the estimated right ventricular systolic pressure is [id number] mmHg. Left Atrium: Left atrial size was severely dilated. Right Atrium: Right atrial size was severely dilated. Pericardium: Trivial pericardial effusion is present. Mitral Valve: The mitral valve is degenerative in appearance. Moderate mitral annular calcification. Moderate mitral valve regurgitation. Mild mitral valve stenosis. The mean mitral valve gradient is 4.9 mmHg with average heart rate of 97 bpm. Tricuspid Valve: The tricuspid valve is abnormal. Tricuspid valve regurgitation is severe. No evidence of tricuspid stenosis. The flow in the hepatic veins is reversed during ventricular systole. Aortic Valve: Mechanical AoV. Vmax 1.8 m/s, MG 7 mmHG, EOA 2.45 cm2, DI 0.71. There appears to be trivial central regurgitation. Otherwise the valve is within normal limits. The aortic valve has been repaired/replaced. Aortic valve regurgitation is trivial. Aortic valve mean gradient measures 7.0 mmHg. Aortic valve peak gradient measures 12.7 mmHg. Aortic valve area, by VTI measures 2.45 cm. There is a mechanical valve present in the aortic position. Pulmonic Valve: The pulmonic valve was grossly normal. Pulmonic valve regurgitation is mild. No evidence of pulmonic stenosis. Aorta: The aortic root and ascending aorta are structurally normal, with no evidence of dilitation. Venous: The inferior vena cava is dilated in size with less than 50% respiratory variability, suggesting right atrial pressure of 15 mmHg. IAS/Shunts: No atrial level shunt detected by color flow Doppler. Agitated saline contrast was given intravenously to evaluate for intracardiac shunting. Agitated saline contrast bubble study was negative, with no evidence of any interatrial shunt.  LEFT VENTRICLE PLAX 2D LVIDd:         4.50 cm     Diastology LVIDs:         3.40 cm     LV e' medial:  6.74 cm/s LV PW:         1.10 cm     LV e'  lateral: 10.10 cm/s LV IVS:        1.10 cm LVOT diam:     2.10 cm LV SV:         76 LV SV Index:   47 LVOT Area:     3.46 cm  LV Volumes (MOD) LV vol d, MOD A2C: 75.7 ml LV vol d, MOD A4C: 45.2 ml LV vol s, MOD A2C: 34.4 ml LV vol s, MOD A4C: 26.4 ml LV SV MOD A2C:     41.3 ml LV SV MOD A4C:     45.2 ml LV SV MOD BP:      28.6 ml RIGHT VENTRICLE            IVC RV S prime:     7.51 cm/s  IVC diam: 2.60 cm TAPSE (M-mode):  0.9 cm LEFT ATRIUM           Index       RIGHT ATRIUM           Index LA diam:      3.90 cm 2.44 cm/m  RA Area:     19.90 cm LA Vol (A2C): 46.0 ml 28.78 ml/m RA Volume:   61.50 ml  38.48 ml/m LA Vol (A4C): 78.9 ml 49.36 ml/m  AORTIC VALVE AV Area (Vmax):    2.55 cm AV Area (Vmean):   2.54 cm AV Area (VTI):     2.45 cm AV Vmax:           178.00 cm/s AV Vmean:          124.000 cm/s AV VTI:            0.308 m AV Peak Grad:      12.7 mmHg AV Mean Grad:      7.0 mmHg LVOT Vmax:         131.20 cm/s LVOT Vmean:        91.050 cm/s LVOT VTI:          0.218 m LVOT/AV VTI ratio: 0.71  AORTA Ao Root diam: 2.80 cm Ao Asc diam:  3.60 cm MITRAL VALVE           TRICUSPID VALVE MV Area VTI:  2.59 cm TV Peak grad:   39.9 mmHg MV Mean grad: 4.9 mmHg TV Vmax:        3.16 m/s MV VTI:       0.29 m   TR Peak grad:   37.2 mmHg                        TR Vmax:        305.00 cm/s                         SHUNTS                        Systemic VTI:  0.22 m                        Systemic Diam: 2.10 cm Eleonore Chiquito MD Electronically signed by Eleonore Chiquito MD Signature Date/Time: 08/12/2020/10:40:32 AM    Final    DG Hip Unilat W or Wo Pelvis 2-3 Views Left  Result Date: 08/11/2020 CLINICAL DATA:  Fall with left-sided pain. EXAM: DG HIP (WITH OR WITHOUT PELVIS) 2-3V LEFT COMPARISON:  None. FINDINGS: Angulated femoral neck fracture on the left. No intertrochanteric component. Pelvic bones appear intact. IMPRESSION: Angulated fracture of the left femoral neck. Electronically Signed   By: Nelson Chimes M.D.   On:  08/11/2020 14:08     LOS: 2 days   Antonieta Pert, MD [facility]  08/13/2020, 11:37 AM

## 2020-08-13 NOTE — Plan of Care (Signed)

## 2020-08-13 NOTE — Progress Notes (Signed)
ANTICOAGULATION CONSULT NOTE - Initial Consult  Pharmacy Consult for Coumadin Indication: Mechanical heart valve  Allergies  Allergen Reactions  . Tape Itching    Redness, Please use "paper" tape only    Patient Measurements: Height: '5\' 7"'$  (170.2 cm) Weight: 52.2 kg (115 lb) IBW/kg (Calculated) : 61.6  Vital Signs: Temp: 97.2 F (36.2 C) (05/27 1450) Temp Source: Oral (05/27 1045) BP: 140/60 (05/27 1450) Pulse Rate: 86 (05/27 1450)  Labs: Recent Labs    08/11/20 1509 08/12/20 0049 08/12/20 0819 08/12/20 1737 08/12/20 1902 08/12/20 2146 08/13/20 0022 08/13/20 0205  HGB 11.8* 10.4*  --   --   --   --  9.8*  --   HCT 38.4 31.5*  --   --   --   --  30.1*  --   PLT 176 144*  --   --   --   --  137*  --   LABPROT 24.2* 25.3*  --   --   --  18.0*  --   --   INR 2.2* 2.3*  --   --   --  1.5*  --   --   HEPARINUNFRC  --  <0.10*   < > <0.10*  --   --  0.32 0.31  CREATININE 1.31*  --   --   --  1.82*  --   --   --    < > = values in this interval not displayed.    Estimated Creatinine Clearance: 19.6 mL/min (A) (by C-G formula based on SCr of 1.82 mg/dL (H)).   Medical History: Past Medical History:  Diagnosis Date  . Anxiety   . Diabetes mellitus with neuropathy ([place])   . Diabetic neuropathy ([place])   . DJD (degenerative joint disease)   . H/O aortic valve replacement   . Hyperlipidemia   . Hypertension   . Neuromuscular disorder ([facility])    neuropathy in feet  . Obesity   . Sleep apnea   . Stroke [facility])    " light stroke"  . Wears glasses     Assessment: Anticoag:  PTA warfarin for mechanical AVR+ afib, INR 2.3>1.5, held warfarin > vitamin K '10mg'$  IV (5/26). Hgb down to 9.8. Plts down to 137.  - PTA warfarin 2.'5mg'$  QD (per med rec- pt states takes 1 tab "pinkish" daily ('5mg'$  peach tab?) but husband would know better-didn't answer phone)  Goal of Therapy:  INR 2.5-3.5 Monitor platelets by anticoagulation protocol: Yes   Plan:  Restart warfarin (goal 2.5-3.5)  post-op '5mg'$  po x 1 tonight. Daily INR Hold heparin for 24hrs post-op per Dr. Erlinda Hong (verbal)   Tyffani Foglesong S. Alford Highland, PharmD, BCPS Clinical Staff Pharmacist [email or web address] Alford Highland, [employer] 08/13/2020,3:10 PM

## 2020-08-13 NOTE — Progress Notes (Signed)
Orthopedic Tech Progress Note Patient Details:  Mary [name] 07-26-1940 [id number]   Ortho Devices Type of Ortho Device: CAM walker Ortho Device/Splint Location: LLE Ortho Device/Splint Interventions: Ordered   Post Interventions Patient Tolerated: Other (comment) (Dropped CAM boot off in patient room) Instructions Provided: Care of device   Mary [name] 08/13/2020, 6:10 PM

## 2020-08-13 NOTE — Progress Notes (Signed)
Small skin tear to left arm noted when removing tape from skin attempted to reach husband no answer dressing in place

## 2020-08-13 NOTE — Progress Notes (Signed)
Patients husband made aware of  skin tear to left wrist

## 2020-08-13 NOTE — Plan of Care (Addendum)
Patient does not have an order to hold or stopped continuous heparin, paged on-call [facility] Dr. Hal Hope, he stated to wait for morning INR results and page dayshift on-call. Will wait results and pass on to dayshift.    Problem: Education: Goal: Knowledge of General Education information will improve Description: Including pain rating scale, medication(s)/side effects and non-pharmacologic comfort measures Outcome: Progressing   Problem: Activity: Goal: Risk for activity intolerance will decrease Outcome: Progressing   Problem: Pain Managment: Goal: General experience of comfort will improve Outcome: Progressing   Problem: Safety: Goal: Ability to remain free from injury will improve Outcome: Progressing   Problem: Skin Integrity: Goal: Risk for impaired skin integrity will decrease Outcome: Progressing

## 2020-08-13 NOTE — Discharge Instructions (Signed)

## 2020-08-13 NOTE — Anesthesia Procedure Notes (Signed)
Procedure Name: Intubation Date/Time: 08/13/2020 12:22 PM Performed by: Eligha Bridegroom, CRNA Pre-anesthesia Checklist: Patient identified, Emergency Drugs available, Suction available, Patient being monitored and Timeout performed Patient Re-evaluated:Patient Re-evaluated prior to induction Oxygen Delivery Method: Circle system utilized Preoxygenation: Pre-oxygenation with 100% oxygen Induction Type: IV induction Ventilation: Mask ventilation without difficulty Laryngoscope Size: Mac and 3 Grade View: Grade III Tube size: 7.0 mm Number of attempts: 2 Airway Equipment and Method: Stylet Placement Confirmation: ETT inserted through vocal cords under direct vision,  positive ETCO2 and breath sounds checked- equal and bilateral Secured at: 21 cm Tube secured with: Tape Dental Injury: Teeth and Oropharynx as per pre-operative assessment

## 2020-08-13 NOTE — H&P (Signed)

## 2020-08-13 NOTE — Progress Notes (Signed)
Orthopedic Tech Progress Note Patient Details:  Mary [name] 1940-10-21 [id number] Left at bedside Ortho Devices Type of Ortho Device: CAM walker Ortho Device/Splint Interventions: Ordered       Danton Sewer A Kadian Barcellos 08/13/2020, 6:06 PM

## 2020-08-13 NOTE — Transfer of Care (Signed)
Immediate Anesthesia Transfer of Care Note  Patient: Mary [name]  Procedure(s) Performed: ANTERIOR APPROACH HEMI HIP ARTHROPLASTY (Left Hip)  Patient Location: PACU  Anesthesia Type:General  Level of Consciousness: awake and alert   Airway & Oxygen Therapy: Patient Spontanous Breathing  Post-op Assessment: Report given to RN and Post -op Vital signs reviewed and stable  Post vital signs: Reviewed and stable  Last Vitals:  Vitals Value Taken Time  BP 151/58 08/13/20 1414  Temp    Pulse 87 08/13/20 1415  Resp 20 08/13/20 1418  SpO2 99 % 08/13/20 1415  Vitals shown include unvalidated device data.  Last Pain:  Vitals:   08/13/20 1045  TempSrc: Oral  PainSc:       Patients Stated Pain Goal: 3 ([id number])  Complications: No complications documented.

## 2020-08-13 NOTE — Anesthesia Preprocedure Evaluation (Addendum)
Anesthesia Evaluation  Patient identified by MRN, date of birth, ID band Patient awake    Reviewed: Allergy & Precautions, NPO status , Patient's Chart, lab work & pertinent test results  History of Anesthesia Complications Negative for: history of anesthetic complications  Airway Mallampati: I  TM Distance: >3 FB Neck ROM: Full    Dental  (+) Chipped, Dental Advisory Given,    Pulmonary sleep apnea ,  Recurrent pleural effusion   Pulmonary exam normal        Cardiovascular hypertension, +CHF  Normal cardiovascular exam+ dysrhythmias Atrial Fibrillation + Valvular Problems/Murmurs (s/p mechanical AVR in 1995)   Pulmonary hypertension with severe RV failure   Neuro/Psych CVA    GI/Hepatic negative GI ROS, (+) Cirrhosis       ,   Endo/Other  diabetes, Type 2  Renal/GU Renal InsufficiencyRenal disease (CKDIII)  negative genitourinary   Musculoskeletal negative musculoskeletal ROS (+)   Abdominal   Peds  Hematology  (+) anemia , Coumadin   Anesthesia Other Findings   Reproductive/Obstetrics                            Anesthesia Physical Anesthesia Plan  ASA: IV  Anesthesia Plan: General   Post-op Pain Management:    Induction: Intravenous  PONV Risk Score and Plan: 3 and Ondansetron, Dexamethasone, Treatment may vary due to age or medical condition and Midazolam  Airway Management Planned: Oral ETT  Additional Equipment: Arterial line  Intra-op Plan:   Post-operative Plan: Extubation in OR  Informed Consent: I have reviewed the patients History and Physical, chart, labs and discussed the procedure including the risks, benefits and alternatives for the proposed anesthesia with the patient or authorized representative who has indicated his/her understanding and acceptance.     Dental advisory given  Plan Discussed with:   Anesthesia Plan Comments: (Per cardiology, "High  operative risk due to both cardiac and non-cardiac comorbidities. Her RV filling pressures are high and there is evidence of severe TR on exam, but overall I think she is about as euvolemic and she would tolerate and seems to be at her baseline from heart failure/PH standpoint and is well perfused. Perioperative recommendations: - High risk for intubation/GA. Strongly consider regional anesthesia.  - Judicious with fluids. Goal should be net even fluid balance perioperatively. Lasix '80mg'$  IV should be an adequate diuretic dose for her should she need it. - If she were to develop perioperative cardiogenic shock, recommend dobutamine for RV support. Avoid any pulmonary vasodilatory - her PVR is normal and wedge pressure was significantly elevated on prior right heart cath. - Monitor on telemetry - see AF recs below"  Unfortunately patient is not a candidate for neuraxial as she has taken coumadin within the last 5 days and has an elevated INR. Patient also requests general anesthesia. Will plan for GA with standard monitors + arterial line.)        Anesthesia Quick Evaluation

## 2020-08-13 NOTE — Op Note (Signed)
ANTERIOR APPROACH HEMI HIP ARTHROPLASTY  Procedure Note GWENDALYNN LELLO   [id number]  Pre-op Diagnosis: Left femoral neck fracture     Post-op Diagnosis: same   Operative Procedures  1. Prosthetic replacement for femoral neck fracture. CPT [phone]  Personnel  Surgeon(s): Leandrew Koyanagi, MD  ASSIST: Madalyn Rob, PA-C   Anesthesia: general  Prosthesis: Depuy Femur: Actis 4 STD Head: 46 mm size: +1.5 Bearing Type: bipolar  Hip Hemiarthroplasty (Anterior Approach) Op Note:  After informed consent was obtained and the operative extremity marked in the holding area, the patient was brought back to the operating room and placed supine on the HANA table. Next, the operative extremity was prepped and draped in normal sterile fashion. Surgical timeout occurred verifying patient identification, surgical site, surgical procedure and administration of antibiotics.  A modified anterior Smith-Peterson approach to the hip was performed, using the interval between tensor fascia lata and sartorius.  Dissection was carried bluntly down onto the anterior hip capsule. The lateral femoral circumflex vessels were identified and coagulated. A capsulotomy was performed and fracture hematoma was evacuated and the capsular flaps tagged for later repair.  The neck osteotomy was performed below the fracture. The femoral head was removed and found a 46 mm head was the appropriate fit.    We then turned our attention to the femur.  After placing the femoral hook, the leg was taken to externally rotated, extended and adducted position taking care to perform soft tissue releases to allow for adequate mobilization of the femur. Soft tissue was cleared from the shoulder of the greater trochanter and the hook elevator used to improve exposure of the proximal femur. Sequential broaching performed up to a size 4. Trial neck and head were placed. The leg was brought back up to neutral and the construct reduced. The  position and sizing of components, offset and leg lengths were checked using fluoroscopy. Stability of the construct was checked in extension and external rotation without any subluxation or impingement of prosthesis. We dislocated the prosthesis, dropped the leg back into position, removed trial components, and irrigated copiously. The final stem and head was then placed, the leg brought back up, the system reduced and fluoroscopy used to verify positioning.  We irrigated, obtained hemostasis and closed the capsule using #2 ethibond suture.  The fascia was closed with #1 vicryl plus, the deep fat layer was closed with 0 vicryl, the subcutaneous layers closed with 2.0 Vicryl Plus and the skin closed with staples. A sterile dressing was applied. The patient was awakened in the operating room and taken to recovery in stable condition. All sponge, needle, and instrument counts were correct at the end of the case.   Tawanna Cooler, my PA, was necessary for opening, closing, exposing, retracting, limb positioning and overall facilitation and completion of the surgery.  Position: supine  Complications: see description of procedure.  Time Out: performed   Drains/Packing: none  Estimated blood loss: see anesthesia record  Returned to Recovery Room: in good condition.   Antibiotics: yes   Mechanical VTE (DVT) Prophylaxis: sequential compression devices, TED thigh-high  Chemical VTE (DVT) Prophylaxis: lovenox  Fluid Replacement: Crystalloid: see anesthesia record  Specimens Removed: 1 to pathology   Sponge and Instrument Count Correct? yes   PACU: portable radiograph - low AP   Admission: inpatient status, start PT & OT POD#1  Plan/RTC: Return in 2 weeks for staple removal. Return in 6 weeks to see MD.  Weight Bearing/Load Lower Extremity: full  Hip precautions: none  N. Eduard Roux, MD Marga Hoots 1:55 PM   Implant Name Type Inv. Item Serial No. Manufacturer Lot No. LRB No.  Used Action  BIPOLAR PROS AML 45MM - [id number] Hips BIPOLAR PROS AML 45MM  [employer] [id number] Left 1 Implanted  STEM FEM ACTIS STD SZ4 - [id number] Stem STEM FEM ACTIS STD SZ4  [employer] [id number] Left 1 Implanted  HIP BALL ARTICU DEPUY - [id number] Hips HIP BALL ARTICU DEPUY  [employer] [id number] Left 1 Implanted

## 2020-08-13 NOTE — Progress Notes (Signed)
ANTICOAGULATION CONSULT NOTE - Follow Up Consult  Pharmacy Consult for Heparin Indication: mechanical valve + afib  Allergies  Allergen Reactions  . Tape Itching    Redness, Please use "paper" tape only    Patient Measurements: Height: '5\' 7"'$  (170.2 cm) Weight: 52.2 kg (115 lb) IBW/kg (Calculated) : 61.6 Heparin Dosing Weight:  52.2 kg  Vital Signs: Temp: 98.3 F (36.8 C) (05/27 0310) Temp Source: Oral (05/27 0310) BP: 133/54 (05/27 0310) Pulse Rate: 93 (05/27 0310)  Labs: Recent Labs    08/11/20 1509 08/12/20 0049 08/12/20 0819 08/12/20 1737 08/12/20 1902 08/12/20 2146 08/13/20 0022 08/13/20 0205  HGB 11.8* 10.4*  --   --   --   --  9.8*  --   HCT 38.4 31.5*  --   --   --   --  30.1*  --   PLT 176 144*  --   --   --   --  137*  --   LABPROT 24.2* 25.3*  --   --   --  18.0*  --   --   INR 2.2* 2.3*  --   --   --  1.5*  --   --   HEPARINUNFRC  --  <0.10*   < > <0.10*  --   --  0.32 0.31  CREATININE 1.31*  --   --   --  1.82*  --   --   --    < > = values in this interval not displayed.    Estimated Creatinine Clearance: 19.6 mL/min (A) (by C-G formula based on SCr of 1.82 mg/dL (H)).  Assessment: SHARE ETTEN a 80 y.o. female on warfarin PTA for mechanical AVR (1995) and Afib. Warfarin held for hip surgery 5/27. Admit INR was subtherapeutic at 2.2. Vitamin K IV '10mg'$  given 5/26. Pharmacy consulted for heparin. Will target higher HL goal given mechanical valve unless bleeding.   5/27 pre-op INR 1.5. Another Vitamin K IV '10mg'$  was ordered but this will not have any meaningful effect other than suppressing INR for longer since already received '10mg'$  IV yesterday. Suggest discontinue vitamin K today and give FFP if requires more INR reversal pre-op.   Heparin stopped at 8am for OR today. Follow up post-op plan.  Warfarin PTA dose 2.'5mg'$  per med rec. Per patient takes 1 "pinkish" tablet daily, which could be '1mg'$  or '5mg'$  tablets. Called husband x3 with no answer, left message  for call back.   Goal of Therapy:  Heparin level 0.5 -0.7 units/ml given mechanical valve   Monitor platelets by anticoagulation protocol: Yes   Plan:   F/u post-op heparin plan F/u restart warfarin  Continue to try contacting husband for accurate home dose   Benetta Spar, PharmD, BCPS, [place] Pharmacist  Please check AMION for all [place] phone numbers After 10:00 PM, call [place] [phone]

## 2020-08-13 NOTE — Progress Notes (Addendum)
ANTICOAGULATION CONSULT NOTE - Follow Up Consult  Pharmacy Consult for Heparin Indication: mechanical valve + afib  Allergies  Allergen Reactions  . Tape Itching    Redness, Please use "paper" tape only    Patient Measurements: Height: '5\' 7"'$  (170.2 cm) Weight: 52.2 kg (115 lb) IBW/kg (Calculated) : 61.6 Heparin Dosing Weight:  52.2 kg  Vital Signs: Temp: 98.5 F (36.9 C) (05/27 0034) Temp Source: Oral (05/27 0034) BP: 121/55 (05/27 0034) Pulse Rate: 101 (05/27 0034)  Labs: Recent Labs    08/11/20 1509 08/11/20 1509 08/12/20 0049 08/12/20 0819 08/12/20 1737 08/12/20 1902 08/12/20 2146 08/13/20 0022  HGB 11.8*  --  10.4*  --   --   --   --  9.8*  HCT 38.4  --  31.5*  --   --   --   --  30.1*  PLT 176  --  144*  --   --   --   --  137*  LABPROT 24.2*  --  25.3*  --   --   --  18.0*  --   INR 2.2*  --  2.3*  --   --   --  1.5*  --   HEPARINUNFRC  --    < > <0.10* 0.11* <0.10*  --   --  0.32  CREATININE 1.31*  --   --   --   --  1.82*  --   --    < > = values in this interval not displayed.    Estimated Creatinine Clearance: 19.6 mL/min (A) (by C-G formula based on SCr of 1.82 mg/dL (H)).  Assessment: Anticoag:  PTA warfarin for mechanical AVR+ afib, INR 2.3, held warfarin > vitamin K '10mg'$  IV (5/26) - PTA warfarin 2.'5mg'$  QD (per med rec- no notes. need to confirm w/ pt)  Heparin level 0.32 - this was drawn about 2 hours post bolus dose given so not sure how accurate it is. Another heparin level drawn about 6 hours post bolus and is 0.31 (low end of therapeutic range).  Goal of Therapy:  Heparin level 0.3-0.7 units/ml aim for higher end Monitor platelets by anticoagulation protocol: Yes   Plan:  L hip Surgery 5/27 at 1100  Increase heparin infusion to 1200 units/hr(goal 0.5-0.7, higher)   F/u post OR  Sherlon Handing, PharmD, BCPS Please see amion for complete clinical pharmacist phone list 08/13/2020,1:43 AM

## 2020-08-13 NOTE — Progress Notes (Signed)
Dr. Kerin Perna at bedside stated A line could be removed

## 2020-08-14 DIAGNOSIS — I2729 Other secondary pulmonary hypertension: Secondary | ICD-10-CM | POA: Diagnosis not present

## 2020-08-14 DIAGNOSIS — Z952 Presence of prosthetic heart valve: Secondary | ICD-10-CM

## 2020-08-14 DIAGNOSIS — Z7901 Long term (current) use of anticoagulants: Secondary | ICD-10-CM | POA: Diagnosis not present

## 2020-08-14 DIAGNOSIS — I4821 Permanent atrial fibrillation: Secondary | ICD-10-CM

## 2020-08-14 LAB — CBC WITH DIFFERENTIAL/PLATELET
Abs Immature Granulocytes: 0.03 10*3/uL (ref 0.00–0.07)
Basophils Absolute: 0 10*3/uL (ref 0.0–0.1)
Basophils Relative: 0 %
Eosinophils Absolute: 0.6 10*3/uL — ABNORMAL HIGH (ref 0.0–0.5)
Eosinophils Relative: 8 %
HCT: 24.2 % — ABNORMAL LOW (ref 36.0–46.0)
Hemoglobin: 7.9 g/dL — ABNORMAL LOW (ref 12.0–15.0)
Immature Granulocytes: 0 %
Lymphocytes Relative: 12 %
Lymphs Abs: 0.9 10*3/uL (ref 0.7–4.0)
MCH: 34.8 pg — ABNORMAL HIGH (ref 26.0–34.0)
MCHC: 32.6 g/dL (ref 30.0–36.0)
MCV: 106.6 fL — ABNORMAL HIGH (ref 80.0–100.0)
Monocytes Absolute: 0.9 10*3/uL (ref 0.1–1.0)
Monocytes Relative: 12 %
Neutro Abs: 5.1 10*3/uL (ref 1.7–7.7)
Neutrophils Relative %: 68 %
Platelets: 134 10*3/uL — ABNORMAL LOW (ref 150–400)
RBC: 2.27 MIL/uL — ABNORMAL LOW (ref 3.87–5.11)
RDW: 15.1 % (ref 11.5–15.5)
WBC: 7.6 10*3/uL (ref 4.0–10.5)
nRBC: 0 % (ref 0.0–0.2)

## 2020-08-14 LAB — BASIC METABOLIC PANEL
Anion gap: 11 (ref 5–15)
Anion gap: 8 (ref 5–15)
BUN: 30 mg/dL — ABNORMAL HIGH (ref 8–23)
BUN: 38 mg/dL — ABNORMAL HIGH (ref 8–23)
CO2: 22 mmol/L (ref 22–32)
CO2: 21 mmol/L — ABNORMAL LOW (ref 22–32)
Calcium: 8.3 mg/dL — ABNORMAL LOW (ref 8.9–10.3)
Calcium: 8 mg/dL — ABNORMAL LOW (ref 8.9–10.3)
Chloride: 100 mmol/L (ref 98–111)
Chloride: 104 mmol/L (ref 98–111)
Creatinine, Ser: 1.53 mg/dL — ABNORMAL HIGH (ref 0.44–1.00)
Creatinine, Ser: 1.79 mg/dL — ABNORMAL HIGH (ref 0.44–1.00)
GFR, Estimated: 34 mL/min — ABNORMAL LOW (ref >60)
GFR, Estimated: 28 mL/min — ABNORMAL LOW (ref >60)
Glucose, Bld: 122 mg/dL — ABNORMAL HIGH (ref 70–99)
Glucose, Bld: 140 mg/dL — ABNORMAL HIGH (ref 70–99)
Potassium: 4.4 mmol/L (ref 3.5–5.1)
Potassium: 4.3 mmol/L (ref 3.5–5.1)
Sodium: 133 mmol/L — ABNORMAL LOW (ref 135–145)
Sodium: 133 mmol/L — ABNORMAL LOW (ref 135–145)

## 2020-08-14 LAB — CBC
HCT: 27.3 % — ABNORMAL LOW (ref 36.0–46.0)
Hemoglobin: 8.7 g/dL — ABNORMAL LOW (ref 12.0–15.0)
MCH: 34.1 pg — ABNORMAL HIGH (ref 26.0–34.0)
MCHC: 31.9 g/dL (ref 30.0–36.0)
MCV: 107.1 fL — ABNORMAL HIGH (ref 80.0–100.0)
Platelets: 128 10*3/uL — ABNORMAL LOW (ref 150–400)
RBC: 2.55 MIL/uL — ABNORMAL LOW (ref 3.87–5.11)
RDW: 15.1 % (ref 11.5–15.5)
WBC: 6.9 10*3/uL (ref 4.0–10.5)
nRBC: 0 % (ref 0.0–0.2)

## 2020-08-14 LAB — PROTIME-INR
INR: 1.2 (ref 0.8–1.2)
INR: 1.6 — ABNORMAL HIGH (ref 0.8–1.2)
Prothrombin Time: 15.3 seconds — ABNORMAL HIGH (ref 11.4–15.2)
Prothrombin Time: 18.9 seconds — ABNORMAL HIGH (ref 11.4–15.2)

## 2020-08-14 LAB — HEPARIN LEVEL (UNFRACTIONATED): Heparin Unfractionated: 0.33 IU/mL (ref 0.30–0.70)

## 2020-08-14 MED ORDER — HEPARIN (PORCINE) 25000 UT/250ML-% IV SOLN
1500.0000 [IU]/h | INTRAVENOUS | Status: DC
  Administered 2020-08-14: 1200 [IU]/h via INTRAVENOUS
  Administered 2020-08-16: 1450 [IU]/h via INTRAVENOUS
  Administered 2020-08-17: 1500 [IU]/h via INTRAVENOUS
  Filled 2020-08-14 (×4): qty 250

## 2020-08-14 MED ORDER — WARFARIN SODIUM 6 MG PO TABS
6.0000 mg | ORAL_TABLET | Freq: Once | ORAL | Status: AC
  Administered 2020-08-14: 6 mg via ORAL
  Filled 2020-08-14: qty 1

## 2020-08-14 NOTE — Progress Notes (Signed)
ANTICOAGULATION CONSULT NOTE - Follow-Up Consult  Pharmacy Consult for Heparin Indication: Mechanical heart valve, AFib   Labs: Recent Labs    08/12/20 1902 08/12/20 2146 08/13/20 0022 08/13/20 0205 08/13/20 1608 08/14/20 0220 08/14/20 2310  HGB  --   --  9.8*  --  10.0* 8.7* 7.9*  HCT  --   --  30.1*  --  31.7* 27.3* 24.2*  PLT  --   --  137*  --  146* 128* 134*  LABPROT  --  18.0*  --   --   --  15.3* 18.9*  INR  --  1.5*  --   --   --  1.2 1.6*  HEPARINUNFRC  --   --  0.32 0.31  --   --  0.33  CREATININE 1.82*  --   --   --  1.62* 1.53*  --     Assessment: 35 yoF admitted from independent living facility s/p mechanical fall. On warfarin PTA for mechanical AVR and AFib. INR reversed with vitamin K 10 mg IV on 5/26. 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 Pharmacy consulted to resume warfarin and resume heparin bridge 24 hours post-op.   Heparin level this am 0.33 units/ml.  INR 1.6 Hg 7.9  Goal of Therapy:  INR goal 2.5-3.5 Heparin level goal 0.3-0.7 Monitor platelets by anticoagulation protocol: Yes   Plan: Continue heparin infusion at 1200 units/hr 8 hr heparin level to confirm Daily INR, HL, CBC, and s/sx of bleeding  Thanks for allowing pharmacy to be a part of this patient's care.  Excell Seltzer, PharmD Clinical Pharmacist  08/14/2020,11:51 PM

## 2020-08-14 NOTE — Evaluation (Signed)
Physical Therapy Evaluation Patient Details Name: Mary [name] MRN: [id number] DOB: 04-Dec-1940 Today's Date: 08/14/2020   History of Present Illness  The pt is an 80 yo female presenting from ALF on 5/25 after a fall on her L side that occured when she tripped over the cord while vacuuming. Imaging revealed L hip fx, and pt is now s/p hemiarthroplasty of L hip on 5/27. PMHx significant for Dementia, CHF, A-fib, DM II with neuropathy, Hx aortic valve replacement, HTN, HLD, OA, and Hx of CVA.    Clinical Impression  Pt in bed upon arrival of PT, agreeable to evaluation at this time. Prior to admission the pt was independent with use of a rollator, living at ALF where she enjoyed helping staff with cleaning and was fairly active. The pt now presents with limitations in functional mobility, strength, activity tolerance, stability, power, and safety awareness due to above dx, and will continue to benefit from skilled PT to address these deficits. The pt reported fatigue at time of eval following OT evaluation earlier this morning, but was agreeable to short bout of activity. The pt required significantly increased assist (modA for sit-stand transfers and mod-maxA to complete ambulation in room) at this time due to reports of significant pain and deficits in muscular strength and endurance that limited ability to advance and bear wt through LLE with gait. The pt will continue to benefit from skilled PT acutely and at ALF following return home to maximize functional recovery and return to independence with transfers and short mobility. If no therapy services are available at ALF, would benefit from short stint SNF for rehab prior to return to ALF.      Follow Up Recommendations Supervision/Assistance - 24 hour (therapy services at ALF)    Equipment Recommendations  None recommended by PT    Recommendations for Other Services       Precautions / Restrictions Precautions Precautions: Fall Precaution  Booklet Issued: No Precaution Comments: per op note, no hip precautions Required Braces or Orthoses: Splint/Cast Splint/Cast: Cam walker on LLE Restrictions Weight Bearing Restrictions: Yes LLE Weight Bearing: Weight bearing as tolerated      Mobility  Bed Mobility Overal bed mobility: Needs Assistance Bed Mobility: Sit to Supine     Supine to sit: HOB elevated;Mod assist Sit to supine: Mod assist   General bed mobility comments: modA to return LLE into bed, pt able to assist with repositioning with cues    Transfers Overall transfer level: Needs assistance Equipment used: Rolling walker (2 wheeled) Transfers: Sit to/from Stand Sit to Stand: Mod assist Stand pivot transfers: Min guard;From elevated surface       General transfer comment: modA to power up from recliner. strong posterior lean, difficulty with anterior translation of wt, increased assist to maintain upright while pt slowly makes adjustments to cues.  Ambulation/Gait Ambulation/Gait assistance: Mod assist;Max assist Gait Distance (Feet): 15 Feet Assistive device: Rolling walker (2 wheeled) Gait Pattern/deviations: Step-to pattern;Decreased step length - left;Decreased stance time - left;Decreased stride length;Decreased weight shift to left;Narrow base of support;Trunk flexed Gait velocity: decreased Gait velocity interpretation: <1.31 ft/sec, indicative of household ambulator General Gait Details: pt initially with minimal clearance of LLE, eventually progressing to needing modA to advance LLE for each steps. very short step-to steps with RLE, heavy relaince on UE        Balance Overall balance assessment: Needs assistance Sitting-balance support: Feet supported;Bilateral upper extremity supported Sitting balance-Leahy Scale: Poor Sitting balance - Comments: pt unable to maintain  without UE support (due to fatigue or pain possibly, pt able to maintain without UE support with OT) Postural control:  Posterior lean Standing balance support: Bilateral upper extremity supported Standing balance-Leahy Scale: Poor Standing balance comment: reliant on BUE support and min-modA to steady                             Pertinent Vitals/Pain Pain Assessment: Faces Pain Score: 7  Faces Pain Scale: Hurts whole lot Pain Location: LLE Pain Descriptors / Indicators: Aching;Discomfort;Grimacing;Sore Pain Intervention(s): Limited activity within patient's tolerance;Monitored during session;Repositioned    Home Living Family/patient expects to be discharged to:: Assisted living               Home Equipment: Walker - 4 wheels;Cane - single point;Shower seat;Grab bars - toilet;Grab bars - tub/shower Additional Comments: Pt very independent, likes assisting at ALF with cleaning and picking up.    Prior Function Level of Independence: Needs assistance   Gait / Transfers Assistance Needed: Uses Rollator for most ambulation  ADL's / Homemaking Assistance Needed: Pt in early stages of dementia, requires supervision/reminders for all ADL's.        Hand Dominance   Dominant Hand: Right    Extremity/Trunk Assessment   Upper Extremity Assessment Upper Extremity Assessment: Defer to OT evaluation    Lower Extremity Assessment Lower Extremity Assessment: Generalized weakness;LLE deficits/detail LLE Deficits / Details: reports sensation intact, able to move against gravitty initially, grossly 3/5, however, significant limitations in muscular endurance, unable to flex hip against gravity by end of session LLE: Unable to fully assess due to pain    Cervical / Trunk Assessment Cervical / Trunk Assessment: Kyphotic;Other exceptions Cervical / Trunk Exceptions: Stooped over most likely due to pain.  Communication   Communication: No difficulties  Cognition Arousal/Alertness: Awake/alert Behavior During Therapy: WFL for tasks assessed/performed Overall Cognitive Status: History of  cognitive impairments - at baseline                                 General Comments: Pt very motivated to move despite pain, aware of her memory/cognitive deficits and will tell you when she does not remember things.      General Comments General comments (skin integrity, edema, etc.): VSS on RA    Exercises     Assessment/Plan    PT Assessment Patient needs continued PT services  PT Problem List Decreased range of motion;Decreased strength;Decreased activity tolerance;Decreased balance;Decreased mobility;Decreased knowledge of use of DME;Pain       PT Treatment Interventions DME instruction;Gait training;Functional mobility training;Therapeutic activities;Therapeutic exercise;Balance training;Patient/family education    PT Goals (Current goals can be found in the Care Plan section)  Acute Rehab PT Goals Patient Stated Goal: To go home PT Goal Formulation: With patient Time For Goal Achievement: 08/28/20 Potential to Achieve Goals: Good    Frequency Min 3X/week    AM-PAC PT "6 Clicks" Mobility  Outcome Measure Help needed turning from your back to your side while in a flat bed without using bedrails?: A Lot Help needed moving from lying on your back to sitting on the side of a flat bed without using bedrails?: A Little Help needed moving to and from a bed to a chair (including a wheelchair)?: A Lot Help needed standing up from a chair using your arms (e.g., wheelchair or bedside chair)?: A Lot Help needed to walk in  hospital room?: A Lot Help needed climbing 3-5 steps with a railing? : Total 6 Click Score: 12    End of Session Equipment Utilized During Treatment: Gait belt Activity Tolerance: Patient limited by fatigue;Patient limited by pain Patient left: in bed;with call bell/phone within reach;with bed alarm set;with family/visitor present Nurse Communication: Mobility status PT Visit Diagnosis: Other abnormalities of gait and mobility (R26.89);Muscle  weakness (generalized) (M62.81);Pain Pain - Right/Left: Left Pain - part of body: Hip;Knee    Time: [id number] PT Time Calculation (min) (ACUTE ONLY): 26 min   Charges:   PT Evaluation $PT Eval Low Complexity: 1 Low PT Treatments $Gait Training: 8-22 mins        Karma Ganja, PT, DPT   Acute Rehabilitation Department Pager #: [phone]  Otho Bellows 08/14/2020, 11:48 AM

## 2020-08-14 NOTE — Progress Notes (Signed)
Progress Note  Patient Name: Mary [name] Date of Encounter: 08/14/2020  [facility] Cardiologist: Carlyle Dolly, MD   Subjective   Just worked with physical therapy, feeling fatigued. Husband at bedside. No chest pain, breathing is stable.  Inpatient Medications    Scheduled Meds: . sodium chloride   Intravenous Once  . acetaminophen  1,000 mg Oral Q6H  . buPROPion  150 mg Oral Daily  . busPIRone  5 mg Oral BID  . docusate sodium  100 mg Oral BID  . feeding supplement  237 mL Oral BID BM  . lactulose  20 g Oral Daily  . multivitamin with minerals  1 tablet Oral Daily  . pantoprazole  40 mg Oral QAC breakfast  . torsemide  20 mg Oral Daily  . warfarin  6 mg Oral ONCE-1600  . Warfarin - Pharmacist Dosing Inpatient   Does not apply q1600   Continuous Infusions: . sodium chloride 75 mL/hr at 08/14/20 0621  .  ceFAZolin (ANCEF) IV 2 g (08/14/20 0021)  . heparin    . lactated ringers 10 mL/hr at 08/13/20 1044  . methocarbamol (ROBAXIN) IV     PRN Meds: acetaminophen, alum & mag hydroxide-simeth, bisacodyl, HYDROcodone-acetaminophen, HYDROmorphone (DILAUDID) injection, magnesium citrate, menthol-cetylpyridinium **OR** phenol, methocarbamol **OR** methocarbamol (ROBAXIN) IV, morphine injection, ondansetron **OR** ondansetron (ZOFRAN) IV, oxyCODONE, oxyCODONE, polyethylene glycol, sorbitol   Vital Signs    Vitals:   08/13/20 1650 08/13/20 1953 08/14/20 0319 08/14/20 0739  BP: (!) 130/56 (!) 109/94 (!) 117/55 (!) 117/49  Pulse: 97 97 96 93  Resp: '16 17 15 17  '$ Temp: 98.8 F (37.1 C) 98.2 F (36.8 C) 98 F (36.7 C) 98.1 F (36.7 C)  TempSrc: Oral Oral Oral Oral  SpO2: 95% 99% 98% 95%  Weight:      Height:        Intake/Output Summary (Last 24 hours) at 08/14/2020 1226 Last data filed at 08/14/2020 0641 Gross per 24 hour  Intake 2041.97 ml  Output 1925 ml  Net 116.97 ml   Last 3 Weights 08/13/2020 08/11/2020 02/18/2020  Weight (lbs) 115 lb 115 lb 114 lb 13.8 oz   Weight (kg) 52.164 kg 52.164 kg 52.1 kg      Telemetry    Atrial fibrillation - Personally Reviewed  ECG    No new since 08/11/20 - Personally Reviewed  Physical Exam   GEN: No acute distress.  Frail appearing Neck: No sustained JVD seen but prominent TR wave at 60 degrees Cardiac: irregularly irregular, crisp mechanical S2, normal S1, no murmurs appreciated Respiratory: Clear to auscultation bilaterally. GI: Soft, nontender, non-distended  MS: No edema; No deformity. LLE boot in place Neuro:  Nonfocal  Psych: Normal affect   Labs    High Sensitivity Troponin:  No results for input(s): TROPONINIHS in the last 720 hours.    Chemistry Recent Labs  Lab 08/11/20 1509 08/12/20 1902 08/13/20 1608 08/14/20 0220  NA 135 136  --  133*  K 3.8 4.4  --  4.4  CL 100 102  --  100  CO2 25 26  --  22  GLUCOSE 100* 125*  --  122*  BUN 24* 30*  --  30*  CREATININE 1.31* 1.82* 1.62* 1.53*  CALCIUM 9.7 8.7*  --  8.3*  PROT 9.0*  --   --   --   ALBUMIN 4.0  --   --   --   AST 28  --   --   --  ALT 16  --   --   --   ALKPHOS 109  --   --   --   BILITOT 1.2  --   --   --   GFRNONAA 41* 27* 32* 34*  ANIONGAP 10 8  --  11     Hematology Recent Labs  Lab 08/13/20 0022 08/13/20 1608 08/14/20 0220  WBC 6.9 6.6 6.9  RBC 2.85* 2.95* 2.55*  HGB 9.8* 10.0* 8.7*  HCT 30.1* 31.7* 27.3*  MCV 105.6* 107.5* 107.1*  MCH 34.4* 33.9 34.1*  MCHC 32.6 31.5 31.9  RDW 14.8 14.8 15.1  PLT 137* 146* 128*    BNPNo results for input(s): BNP, PROBNP in the last 168 hours.   DDimer No results for input(s): DDIMER in the last 168 hours.   Radiology    Pelvis Portable  Result Date: 08/13/2020 CLINICAL DATA:  Left hip arthroplasty. EXAM: PORTABLE PELVIS 1-2 VIEWS COMPARISON:  None. FINDINGS: Left hip arthroplasty in expected alignment. There is no periprosthetic lucency or fracture. Recent postsurgical change includes air and edema in the soft tissues. Vascular calcifications are seen.  IMPRESSION: Left hip arthroplasty without immediate postoperative complication. Electronically Signed   By: Keith Rake M.D.   On: 08/13/2020 15:19   DG C-Arm 1-60 Min  Result Date: 08/13/2020 CLINICAL DATA:  Surgery, elective. Additional history provided: Left anterior hip. Provided fluoroscopy time 9 seconds (0.70 mGy). EXAM: OPERATIVE left HIP (WITH PELVIS IF PERFORMED) 4 VIEWS TECHNIQUE: Fluoroscopic spot image(s) were submitted for interpretation post-operatively. COMPARISON:  Radiographs of the left hip 08/11/2020. FINDINGS: Four intraoperative fluoroscopic images of the left hip are submitted. On the provided images, there are findings of interval left total hip arthroplasty. The femoral and acetabular components appear well seated. No unexpected finding on the provided views. IMPRESSION: Four intraoperative fluoroscopic images of the left hip from left hip total arthroplasty, as described. Electronically Signed   By: Kellie Simmering DO   On: 08/13/2020 14:18   DG HIP OPERATIVE UNILAT WITH PELVIS LEFT  Result Date: 08/13/2020 CLINICAL DATA:  Surgery, elective. Additional history provided: Left anterior hip. Provided fluoroscopy time 9 seconds (0.70 mGy). EXAM: OPERATIVE left HIP (WITH PELVIS IF PERFORMED) 4 VIEWS TECHNIQUE: Fluoroscopic spot image(s) were submitted for interpretation post-operatively. COMPARISON:  Radiographs of the left hip 08/11/2020. FINDINGS: Four intraoperative fluoroscopic images of the left hip are submitted. On the provided images, there are findings of interval left total hip arthroplasty. The femoral and acetabular components appear well seated. No unexpected finding on the provided views. IMPRESSION: Four intraoperative fluoroscopic images of the left hip from left hip total arthroplasty, as described. Electronically Signed   By: Kellie Simmering DO   On: 08/13/2020 14:18    Cardiac Studies   Echo 08/12/20 1. Mechanical AoV. Vmax 1.8 m/s, MG 7 mmHG, EOA 2.45 cm2, DI  0.71. There  appears to be trivial central regurgitation. Otherwise the valve is within  normal limits. The aortic valve has been repaired/replaced. Aortic valve  regurgitation is trivial. There  is a mechanical valve present in the aortic position.  2. Right ventricular systolic function is moderately reduced. The right  ventricular size is moderately enlarged. There is moderately elevated  pulmonary artery systolic pressure. The estimated right ventricular  systolic pressure is [id number] mmHg.  3. The tricuspid valve is abnormal. Tricuspid valve regurgitation is  severe.  4. Left ventricular ejection fraction, by estimation, is 65 to 70%. The  left ventricle has normal function. The left ventricle has  no regional  wall motion abnormalities. Indeterminate diastolic filling due to E-A  fusion. There is the interventricular  septum is flattened in systole and diastole, consistent with right  ventricular pressure and volume overload.  5. Left atrial size was severely dilated.  6. Right atrial size was severely dilated.  7. The mitral valve is degenerative. Moderate mitral valve regurgitation.  Mild mitral stenosis. The mean mitral valve gradient is 4.9 mmHg with  average heart rate of 97 bpm. Moderate mitral annular calcification.  8. The inferior vena cava is dilated in size with <50% respiratory  variability, suggesting right atrial pressure of 15 mmHg.  9. Agitated saline contrast bubble study was negative, with no evidence  of any interatrial shunt.   Comparison(s): No significant change from prior study.   Patient Profile     80 y.o. 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 Waldron Labs.  Assessment & Plan    Mechanical AVR -appreciate pharmacy assistance, now  restarting warfarin -agree with restarting heparin, especially given that patient received vitamin K. 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 -continue torsemide -restart spironolactone prior to discharge  Rest of medical comorbidities per primary team  [facility] will sign off.   Medication Recommendations:  Return to home medications (warfarin, torsemide, spironolactone) prior to discharge Other recommendations (labs, testing, etc):  Will need close INR monitoring post discharge (follows in [place]).  Follow up as an outpatient:  Will get her post discharge follow up with cardiology as well.  For questions or updates, please contact [place] Please consult [email or web address] for contact info under        Signed, Buford Dresser, MD  08/14/2020, 12:26 PM

## 2020-08-14 NOTE — Progress Notes (Addendum)
     Subjective: 1 Day Post-Op Procedure(s) (LRB): ANTERIOR APPROACH HEMI HIP ARTHROPLASTY (Left) Awake, alert and oriented x 4. Dressing left anterior hip intact, no drainage. Left anterior proximal tibia with abrasion, dressing changed and betadiene swab and mepilex dressing applied. Left ankle CAM walking brace for posterior malleolus fracture less than 20 % joint line. PT with walker WBAT. Lives in assisted living facility but will need acute care Environment for rehabilitation following her left hip hemiarthroplasty and left ankle injury.  Anemia Hgb 8.7.  Patient reports pain as moderate.    Objective:   VITALS:  Temp:  [97.2 F (36.2 C)-98.8 F (37.1 C)] 98.1 F (36.7 C) (05/28 0739) Pulse Rate:  [81-97] 93 (05/28 0739) Resp:  [15-25] 17 (05/28 0739) BP: (109-151)/(49-94) 117/49 (05/28 0739) SpO2:  [94 %-100 %] 95 % (05/28 0739) Arterial Line BP: (169-174)/(50) 169/50 (05/27 1425)  Neurologically intact ABD soft Neurovascular intact Sensation intact distally Intact pulses distally Dorsiflexion/Plantar flexion intact Incision: dressing C/D/I, no drainage and left knee dressing changed abrasion clean no erythrema or drainage.  No cellulitis present Compartment soft   LABS Recent Labs    08/11/20 1509 08/12/20 0049 08/13/20 0022 08/13/20 1608 08/14/20 0220  HGB 11.8* 10.4* 9.8* 10.0* 8.7*  WBC 9.3 6.5 6.9 6.6 6.9  PLT 176 144* 137* 146* 128*   Recent Labs    08/12/20 1902 08/13/20 1608 08/14/20 0220  NA 136  --  133*  K 4.4  --  4.4  CL 102  --  100  CO2 26  --  22  BUN 30*  --  30*  CREATININE 1.82* 1.62* 1.53*  GLUCOSE 125*  --  122*   Recent Labs    08/12/20 2146 08/14/20 0220  INR 1.5* 1.2     Assessment/Plan: 1 Day Post-Op Procedure(s) (LRB): ANTERIOR APPROACH HEMI HIP ARTHROPLASTY (Left)  Advance diet Up with therapy Discharge to SNF  Basil Dess 08/14/2020, 11:01 AMPatient ID: Mary [name], female   DOB: [birth year], 80 y.o.   MRN:  [id number]

## 2020-08-14 NOTE — Evaluation (Signed)
Occupational Therapy Evaluation Patient Details Name: Mary [name] MRN: [id number] DOB: 03-27-1940 Today's Date: 08/14/2020    History of Present Illness The pt is an 80 yo female presenting from ALF on 5/25 after a fall on her L side that occured when she tripped over the cord while vacuuming. Imaging revealed L hip fx, and pt is now s/p hemiarthroplasty of L hip on 5/27. PMHx significant for Dementia, CHF, A-fib, DM II with neuropathy, Hx aortic valve replacement, HTN, HLD, OA, and Hx of CVA.   Clinical Impression   Pt admitted to the Ed for concerns and procedure listed above. PTA pt was independent using a rollator for ambulation, completing all ADL's and assisting with cleaning and social events at her ALF. At the time of the evaluation, pt was motivated to mobilize, reporting that she knew she had to get up in order to get better. Pt required min guard for all functional mobility for safety, as well as verbal cueing for ADL's and education on her hip precautions. Pt's husband in the room and very supportive and involved in pt's care. Acute OT will continue to follow up to assist with all concerns listed below.     Follow Up Recommendations  Home health OT;Outpatient OT;Supervision/Assistance - 24 hour (Whatever therapy services are offered at ALF)    Equipment Recommendations  3 in 1 bedside commode    Recommendations for Other Services       Precautions / Restrictions Precautions Precautions: Posterior Hip;Fall Precaution Booklet Issued: No Required Braces or Orthoses: Splint/Cast Splint/Cast: Cam walker Restrictions Weight Bearing Restrictions: Yes LLE Weight Bearing: Weight bearing as tolerated      Mobility Bed Mobility Overal bed mobility: Needs Assistance Bed Mobility: Supine to Sit     Supine to sit: HOB elevated;Mod assist     General bed mobility comments: Pt required increased time and mod assist for bringing legs off the bed and pivotting hips to be squared at  EOB    Transfers Overall transfer level: Needs assistance Equipment used: Rolling walker (2 wheeled) Transfers: Sit to/from [facility] Sit to Stand: Min guard;From elevated surface Stand pivot transfers: Min guard;From elevated surface       General transfer comment: Bed elevated 4 inches, no physical assistance, min guard for safety and cueing to stand up straight.    Balance Overall balance assessment: Needs assistance Sitting-balance support: No upper extremity supported;Feet supported Sitting balance-Leahy Scale: Good     Standing balance support: Bilateral upper extremity supported Standing balance-Leahy Scale: Fair                             ADL either performed or assessed with clinical judgement   ADL Overall ADL's : Needs assistance/impaired Eating/Feeding: Independent;Sitting   Grooming: Wash/dry face;Wash/dry hands;Oral care;Set up;Sitting Grooming Details (indicate cue type and reason): completed EOB Upper Body Bathing: Minimal assistance;Sitting Upper Body Bathing Details (indicate cue type and reason): minimal assistance with remaining upright posture. Lower Body Bathing: Maximal assistance;Adhering to hip precautions;Sitting/lateral leans;Sit to/from stand Lower Body Bathing Details (indicate cue type and reason): Max assist due to pain and adhereihng to hip precautions. Upper Body Dressing : Min guard;Sitting Upper Body Dressing Details (indicate cue type and reason): Min guard for seated balance. Lower Body Dressing: Maximal assistance;Adhering to hip precautions;Sitting/lateral leans;Sit to/from stand Lower Body Dressing Details (indicate cue type and reason): MAx A due to pain and adhereing to hip precautions. Toilet Transfer: Minimal  assistance;Stand-pivot;BSC;RW Armed forces technical officer Details (indicate cue type and reason): Min assist for cueing and RW positioning. Toileting- Clothing Manipulation and Hygiene: Moderate  assistance;Adhering to hip precautions;Sitting/lateral lean;Sit to/from stand Toileting - Clothing Manipulation Details (indicate cue type and reason): Pt needs mod A for pericare due to balance concerns and pain levels. Tub/ Shower Transfer: Minimal assistance;Stand-pivot;Shower Scientist, research (medical) Details (indicate cue type and reason): Min assist for safety, RW placement, and cueing Functional mobility during ADLs: Minimal assistance;Rolling walker General ADL Comments: Pt overall does well with mobility, requires min A for safety with cueing, RW positioning, and precaution reminders.     Vision Baseline Vision/History: Wears glasses Wears Glasses: At all times Patient Visual Report: No change from baseline Vision Assessment?: No apparent visual deficits     Perception Perception Perception Tested?: No   Praxis Praxis Praxis tested?: Not tested    Pertinent Vitals/Pain Pain Assessment: 0-10 Pain Score: 7  Pain Location: LLE Pain Descriptors / Indicators: Aching;Discomfort;Grimacing;Sore Pain Intervention(s): Limited activity within patient's tolerance;Monitored during session;Repositioned     Hand Dominance Right   Extremity/Trunk Assessment Upper Extremity Assessment Upper Extremity Assessment: Generalized weakness   Lower Extremity Assessment Lower Extremity Assessment: Defer to PT evaluation   Cervical / Trunk Assessment Cervical / Trunk Assessment: Kyphotic;Other exceptions Cervical / Trunk Exceptions: Stooped over most likely due to pain.   Communication Communication Communication: No difficulties   Cognition Arousal/Alertness: Awake/alert Behavior During Therapy: WFL for tasks assessed/performed Overall Cognitive Status: History of cognitive impairments - at baseline                                 General Comments: Pt very motivated to move despite pain, aware of her memory/cognitive deficits and will tell you when she does not remember  things.   General Comments  VSS on RA    Exercises     Shoulder Instructions      Home Living Family/patient expects to be discharged to:: Assisted living                             Home Equipment: Walker - 4 wheels;Cane - single point;Shower seat;Grab bars - toilet;Grab bars - tub/shower   Additional Comments: Pt very independent, likes assisting at ALF with cleaning and picking up.      Prior Functioning/Environment Level of Independence: Needs assistance  Gait / Transfers Assistance Needed: Uses Rollator for most ambulation ADL's / Homemaking Assistance Needed: Pt in early stages of dementia, requires supervision/reminders for all ADL's.            OT Problem List: Decreased strength;Decreased activity tolerance;Impaired balance (sitting and/or standing);Decreased coordination;Decreased safety awareness;Decreased knowledge of use of DME or AE;Decreased knowledge of precautions;Pain      OT Treatment/Interventions: Self-care/ADL training;Therapeutic exercise;Energy conservation;DME and/or AE instruction;Therapeutic activities;Cognitive remediation/compensation;Patient/family education;Balance training    OT Goals(Current goals can be found in the care plan section) Acute Rehab OT Goals Patient Stated Goal: To go home OT Goal Formulation: With patient/family Time For Goal Achievement: 08/28/20 Potential to Achieve Goals: Good ADL Goals Pt Will Perform Lower Body Dressing: with adaptive equipment;with modified independence;sit to/from stand;sitting/lateral leans Pt Will Transfer to Toilet: with modified independence;ambulating;bedside commode Pt Will Perform Toileting - Clothing Manipulation and hygiene: with modified independence;sitting/lateral leans;sit to/from stand Additional ADL Goal #1: Pt will recall hip precautions with 1 verbal cue. Additional ADL Goal #2: Pt will verbalize  3 fall prevention techniques with min verbal cueing.  OT Frequency: Min  2X/week   Barriers to D/C:            Co-evaluation              AM-PAC OT "6 Clicks" Daily Activity     Outcome Measure Help from another person eating meals?: None Help from another person taking care of personal grooming?: A Little Help from another person toileting, which includes using toliet, bedpan, or urinal?: A Little Help from another person bathing (including washing, rinsing, drying)?: A Lot Help from another person to put on and taking off regular upper body clothing?: A Little Help from another person to put on and taking off regular lower body clothing?: A Lot 6 Click Score: 17   End of Session Equipment Utilized During Treatment: Gait belt;Rolling walker Nurse Communication: Mobility status  Activity Tolerance: Patient tolerated treatment well Patient left: in chair;with call bell/phone within reach;with chair alarm set;with family/visitor present  OT Visit Diagnosis: Unsteadiness on feet (R26.81);Other abnormalities of gait and mobility (R26.89);Muscle weakness (generalized) (M62.81)                Time: [id number] OT Time Calculation (min): 35 min Charges:  OT General Charges $OT Visit: 1 Visit OT Evaluation $OT Eval Moderate Complexity: 1 Mod OT Treatments $Self Care/Home Management : 8-22 mins  Quadre Bristol H., OTR/L Acute Rehabilitation  Mariame Rybolt Elane Yolanda Bonine 08/14/2020, 10:37 AM

## 2020-08-14 NOTE — Plan of Care (Signed)

## 2020-08-14 NOTE — Progress Notes (Signed)
PROGRESS NOTE    KATELYNN MCALPINE  [id number] DOB: December 11, 1940 DOA: 08/11/2020 PCP: Bonnita Nasuti, MD   Chief Complaint  Patient presents with  . Fall  Brief Narrative: 80 year old female with persistent A. fib, chronic diastolic CHF, recurrent pleural effusion, mechanical aortic valve in 1995 on Coumadin, HTN, HLD, OSA, psoriasis, orthostatic hypotension, history of CVA, T2DM lives independently at [place] presented after a fall and is having hip pain after she tripped over the cord while vacuuming her floor and fell onto her left side.  She had no loss of consciousness.  She was seen in the ED INR 2.2, creatinine 1.3 hemoglobin 11.8, x-ray showed left hip fracture.  Anesthesia had any pain advised transferred to [facility] for cardiology evaluation Dr. Erlinda Hong from orthopedic was contacted and transferred. Seen by cardiology in preop evaluation echocardiogram no significant changes and okay to proceed with surgery 5/27. Patient was bridged to heparin preoperatively.  08/14/2020: Patient same.  Patient underwent prosthetic replacement for femoral neck fracture yesterday.  Patient is currently on heparin drip.  Coumadin has been started.  Input from orthopedic and cardiology teams are highly appreciated.  Subjective: -Pain is controlled. -No shortness of breath. -No chest pain  Assessment & Plan:  Left hip fracture/posterior malleolar fracture secondary to mechanical fall: Seen by orthopedic planning for operative intervention of the left hip and nonoperative management of the malleolar fracture after cardiology clearance.  Echo p no significant change from prior echo and patient going for surgery today.   Continue pain control with oral and IV opiates.  High risk for intubation/CVA so cardiology strongly encouraged regional anesthesia, along with further recommendation (fluid, as needed IV Lasix, dobutamine for RV support in case of perioperative cardiogenic shock.  Defer to  cardio/orthopedics. 08/14/2020: Patient underwent prosthetic replacement for femoral neck fracture yesterday.  Orthopedic team is directing postop management as well as cardiology team.  Patient is currently on heparin drip.  Coumadin has been started.  Mechanical AVR since 1995 on long-term anticoagulation with Coumadin: Heart rate is controlled, continue with heparin bridge resume Coumadin once okay with orthopedics.  Being bridged with heparin holding Coumadin.  Continue as per cardiology pharmacy managing. 08/14/2020: See above documentation.  Persistent A. fib longstanding heart rate goal less than 120 avoiding CCB or BB, can use  a single dose of digoxin  for acute rate control along with beta-blocker p.o. every 6 hours  If needed  and uptitrate as needed okay to use short-term amiodarone infusion if blood pressure limits use of beta-blocker  History of combined systolic and diastolic CHF/mild pulmonary hypertension-plan per cardiology see above, echo no new changes.  Volume status is stable, on Demadex. 08/14/2020: Stable.  T2DM: blood sugar well controlled A1c 5.1 in 2021 Recent Labs  Lab 08/11/20 1914 08/13/20 1045 08/13/20 1414  GLUCAP 99 97 100*   Chronic anemia downtrending monitor closely in the setting of Coumadin Heparin May need transfusion Recent Labs  Lab 08/11/20 1509 08/12/20 0049 08/13/20 0022 08/13/20 1608 08/14/20 0220  HGB 11.8* 10.4* 9.8* 10.0* 8.7*  HCT 38.4 31.5* 30.1* 31.7* 27.3*   OSA noncompliant with CPAP  HLD continue simvastatin History of CVA-stable.  Anticoagulation as above Cirrhosis-stable CKD stage IIIb monitor renal function.  Renal function overall stable. Baseline creatinine as high as 2 in the past. Recent Labs    11/02/19 0706 12/08/19 0042 01/15/20 1325 01/16/20 0455 01/17/20 0600 02/11/20 1311 02/18/20 1830 08/11/20 1509 08/12/20 1902 08/13/20 1608 08/14/20 0220  BUN 45* 37*  45* 31* 23 26* 22 24* 30*  --  30*  CREATININE  1.58* 1.35* 2.01* 1.39* 1.16* 1.55* 1.26* 1.31* 1.82* 1.62* 1.53*   Moderate protein calorie malnutrition - augment diet as below dietitian on board Nutrition Problem: Moderate Malnutrition Etiology: social / environmental circumstances Signs/Symptoms: energy intake < or equal to 75% for > or equal to 1 month,mild fat depletion,mild muscle depletion Interventions: Ensure Enlive (each supplement provides 350kcal and 20 grams of protein),MVI   Diet Order            Diet Heart Room service appropriate? Yes; Fluid consistency: Thin  Diet effective now                 Patient's Body mass index is 18.01 kg/m.  DVT prophylaxis: SCDs Start: 08/13/20 1541heparin gtt.  Coumadin started today, 08/14/2020. Code Status:   Code Status: Full Code  Family Communication: plan of care discussed with patient and her husband at bedside.  Status is: Inpatient  Remains inpatient appropriate because:IV treatments appropriate due to intensity of illness or inability to take PO and For management of hip fracture Dispo: The patient is from: Independent living facility at [facility]              Anticipated d/c is to: SNF              Patient currently is not medically stable to d/c.   Difficult to place patient No Unresulted Labs (From admission, onward)          Start     Ordered   08/20/20 0500  Creatinine, serum  (BRIDGE to warfarin (includes Lovenox or Heparin -AND- warfarin pharmacy consult))  Weekly,   R     Comments: while on enoxaparin therapy.   Question:  Specimen collection method  Answer:  Lab=Lab collect   08/13/20 1540   08/16/20 0500  Heparin level (unfractionated)  Daily,   R     Question:  Specimen collection method  Answer:  Lab=Lab collect   08/14/20 1438   08/15/20 0500  CBC with Differential/Platelet  Tomorrow morning,   R       Question:  Specimen collection method  Answer:  Lab=Lab collect   08/14/20 1205   08/15/20 [id number]  Basic metabolic panel  Tomorrow morning,   R        Question:  Specimen collection method  Answer:  Lab=Lab collect   08/14/20 1205   08/14/20 2300  Heparin level (unfractionated)  Once-Timed,   TIMED       Question:  Specimen collection method  Answer:  Lab=Lab collect   08/14/20 1438   08/14/20 [id number]  Basic metabolic panel  Daily,   R     Question:  Specimen collection method  Answer:  Lab=Lab collect   08/13/20 1540   08/14/20 0500  Protime-INR  (BRIDGE to warfarin (includes Lovenox or Heparin -AND- warfarin pharmacy consult))  Daily,   R     Question:  Specimen collection method  Answer:  Lab=Lab collect   08/13/20 1124   08/12/20 0500  CBC  Daily,   R      08/11/20 1708         Medications reviewed:  Scheduled Meds: . sodium chloride   Intravenous Once  . acetaminophen  1,000 mg Oral Q6H  . buPROPion  150 mg Oral Daily  . busPIRone  5 mg Oral BID  . docusate sodium  100 mg Oral BID  . feeding supplement  237 mL  Oral BID BM  . lactulose  20 g Oral Daily  . multivitamin with minerals  1 tablet Oral Daily  . pantoprazole  40 mg Oral QAC breakfast  . torsemide  20 mg Oral Daily  . warfarin  6 mg Oral ONCE-1600  . Warfarin - Pharmacist Dosing Inpatient   Does not apply q1600   Continuous Infusions: . sodium chloride 75 mL/hr at 08/14/20 0621  . heparin 1,200 Units/hr (08/14/20 1530)  . lactated ringers 10 mL/hr at 08/13/20 1044  . methocarbamol (ROBAXIN) IV      Consultants:see note  Procedures:see note  Antimicrobials: Anti-infectives (From admission, onward)   Start     Dose/Rate Route Frequency Ordered Stop   08/14/20 0000  ceFAZolin (ANCEF) IVPB 2g/100 mL premix        2 g 200 mL/hr over 30 Minutes Intravenous Every 12 hours 08/13/20 1540 08/14/20 1613   08/13/20 1254  vancomycin (VANCOCIN) powder  Status:  Discontinued          As needed 08/13/20 1254 08/13/20 1408   08/13/20 0600  ceFAZolin (ANCEF) IVPB 2g/100 mL premix        2 g 200 mL/hr over 30 Minutes Intravenous On call to O.R. 08/12/20 1831 08/13/20  1200     Culture/Microbiology    Component Value Date/Time   SDES PERITONEAL ABDOMEN 10/31/2019 1657   SDES PERITONEAL ABDOMEN 10/31/2019 1657   SPECREQUEST BOTTLES DRAWN AEROBIC AND ANAEROBIC 10/31/2019 1657   SPECREQUEST NONE 10/31/2019 1657   CULT  10/31/2019 1657    NO GROWTH 5 DAYS Performed at [facility], [place] [address], [place], [place] [address]    REPTSTATUS 11/05/2019 FINAL 10/31/2019 1657   REPTSTATUS 10/31/2019 FINAL 10/31/2019 1657    Other culture-see note  Objective: Vitals: Today's Vitals   08/14/20 0044 08/14/20 0319 08/14/20 0739 08/14/20 1619  BP:  (!) 117/55 (!) 117/49 (!) 111/46  Pulse:  96 93 96  Resp:  '15 17 18  '$ Temp:  98 F (36.7 C) 98.1 F (36.7 C) 98.1 F (36.7 C)  TempSrc:  Oral Oral Oral  SpO2:  98% 95% 97%  Weight:      Height:      PainSc: 7        Intake/Output Summary (Last 24 hours) at 08/14/2020 1647 Last data filed at 08/14/2020 0641 Gross per 24 hour  Intake 951.78 ml  Output 1300 ml  Net -348.22 ml   Filed Weights   08/11/20 1126 08/13/20 1039  Weight: 52.2 kg 52.2 kg   Weight change:   Intake/Output from previous day: 05/27 0701 - 05/28 0700 In: 2042 [I.V.:1842; IV Piggyback:200] Out: 1925 [Urine:1825; Blood:100] Intake/Output this shift: No intake/output data recorded. Filed Weights   08/11/20 1126 08/13/20 1039  Weight: 52.2 kg 52.2 kg    Examination: General exam: AAOx 3, elderly lady, frail.  HEENT: Mild pallor. Respiratory system: Clear to auscultation.   Cardiovascular system: S1 & S2, systolic murmur at the apex.  Mechanical heart valve sounds heard at the thoracic area with systolic murmur.  Gastrointestinal system: Abdomen soft,NT,ND, BS+ Nervous System:Alert, awake, moving extremities, tender on  lft hip Extremities: no edema.  Data Reviewed: I have personally reviewed following labs and imaging studies CBC: Recent Labs  Lab 08/11/20 1509 08/12/20 0049 08/13/20 0022 08/13/20 1608  08/14/20 0220  WBC 9.3 6.5 6.9 6.6 6.9  HGB 11.8* 10.4* 9.8* 10.0* 8.7*  HCT 38.4 31.5* 30.1* 31.7* 27.3*  MCV 110.0* 104.0* 105.6* 107.5* 107.1*  PLT 176 144* 137* 146* 0000000*   Basic Metabolic Panel: Recent Labs  Lab 08/11/20 1509 08/12/20 1902 08/13/20 1608 08/14/20 0220  NA 135 136  --  133*  K 3.8 4.4  --  4.4  CL 100 102  --  100  CO2 25 26  --  22  GLUCOSE 100* 125*  --  122*  BUN 24* 30*  --  30*  CREATININE 1.31* 1.82* 1.62* 1.53*  CALCIUM 9.7 8.7*  --  8.3*   GFR: Estimated Creatinine Clearance: 23.4 mL/min (A) (by C-G formula based on SCr of 1.53 mg/dL (H)). Liver Function Tests: Recent Labs  Lab 08/11/20 1509  AST 28  ALT 16  ALKPHOS 109  BILITOT 1.2  PROT 9.0*  ALBUMIN 4.0   No results for input(s): LIPASE, AMYLASE in the last 168 hours. No results for input(s): AMMONIA in the last 168 hours. Coagulation Profile: Recent Labs  Lab 08/11/20 1509 08/12/20 0049 08/12/20 2146 08/14/20 0220  INR 2.2* 2.3* 1.5* 1.2   Cardiac Enzymes: No results for input(s): CKTOTAL, CKMB, CKMBINDEX, TROPONINI in the last 168 hours. BNP (last 3 results) No results for input(s): PROBNP in the last 8760 hours. HbA1C: No results for input(s): HGBA1C in the last 72 hours. CBG: Recent Labs  Lab 08/11/20 1914 08/13/20 1045 08/13/20 1414  GLUCAP 99 97 100*   Lipid Profile: No results for input(s): CHOL, HDL, LDLCALC, TRIG, CHOLHDL, LDLDIRECT in the last 72 hours. Thyroid Function Tests: No results for input(s): TSH, T4TOTAL, FREET4, T3FREE, THYROIDAB in the last 72 hours. Anemia Panel: No results for input(s): VITAMINB12, FOLATE, FERRITIN, TIBC, IRON, RETICCTPCT in the last 72 hours. Sepsis Labs: Recent Labs  Lab 08/11/20 1509  LATICACIDVEN 1.9    Recent Results (from the past 240 hour(s))  Resp Panel by RT-PCR (Flu A&B, Covid) Nasopharyngeal Swab     Status: None   Collection Time: 08/11/20 12:21 PM   Specimen: Nasopharyngeal Swab; Nasopharyngeal(NP) swabs  in vial transport medium  Result Value Ref Range Status   SARS Coronavirus 2 by RT PCR NEGATIVE NEGATIVE Final    Comment: (NOTE) SARS-CoV-2 target nucleic acids are NOT DETECTED.  The SARS-CoV-2 RNA is generally detectable in upper respiratory specimens during the acute phase of infection. The lowest concentration of SARS-CoV-2 viral copies this assay can detect is 138 copies/mL. A negative result does not preclude SARS-Cov-2 infection and should not be used as the sole basis for treatment or other patient management decisions. A negative result may occur with  improper specimen collection/handling, submission of specimen other than nasopharyngeal swab, presence of viral mutation(s) within the areas targeted by this assay, and inadequate number of viral copies(<138 copies/mL). A negative result must be combined with clinical observations, patient history, and epidemiological information. The expected result is Negative.  Fact Sheet for Patients:  EntrepreneurPulse.com.au  Fact Sheet for Healthcare Providers:  IncredibleEmployment.be  This test is no t yet approved or cleared by the [place] FDA and  has been authorized for detection and/or diagnosis of SARS-CoV-2 by FDA under an Emergency Use Authorization (EUA). This EUA will remain  in effect (meaning this test can be used) for the duration of the COVID-19 declaration under Section 564(b)(1) of the Act, 21 U.S.C.section 360bbb-3(b)(1), unless the authorization is terminated  or revoked sooner.       Influenza A by PCR NEGATIVE NEGATIVE Final   Influenza B by PCR NEGATIVE NEGATIVE Final    Comment: (NOTE) The Xpert Xpress SARS-CoV-2/FLU/RSV plus assay is  intended as an aid in the diagnosis of influenza from Nasopharyngeal swab specimens and should not be used as a sole basis for treatment. Nasal washings and aspirates are unacceptable for Xpert Xpress  SARS-CoV-2/FLU/RSV testing.  Fact Sheet for Patients: EntrepreneurPulse.com.au  Fact Sheet for Healthcare Providers: IncredibleEmployment.be  This test is not yet approved or cleared by the [place] FDA and has been authorized for detection and/or diagnosis of SARS-CoV-2 by FDA under an Emergency Use Authorization (EUA). This EUA will remain in effect (meaning this test can be used) for the duration of the COVID-19 declaration under Section 564(b)(1) of the Act, 21 U.S.C. section 360bbb-3(b)(1), unless the authorization is terminated or revoked.  Performed at [facility], [address] [place], [place] [address]   Surgical pcr screen     Status: None   Collection Time: 08/12/20  1:00 AM   Specimen: Nasal Mucosa; Nasal Swab  Result Value Ref Range Status   MRSA, PCR NEGATIVE NEGATIVE Final   Staphylococcus aureus NEGATIVE NEGATIVE Final    Comment: (NOTE) The Xpert SA Assay (FDA approved for NASAL specimens in patients 44 years of age and older), is one component of a comprehensive surveillance program. It is not intended to diagnose infection nor to guide or monitor treatment. Performed at [facility], [place] [address], [place], [place] [address]      Radiology Studies: Pelvis Portable  Result Date: 08/13/2020 CLINICAL DATA:  Left hip arthroplasty. EXAM: PORTABLE PELVIS 1-2 VIEWS COMPARISON:  None. FINDINGS: Left hip arthroplasty in expected alignment. There is no periprosthetic lucency or fracture. Recent postsurgical change includes air and edema in the soft tissues. Vascular calcifications are seen. IMPRESSION: Left hip arthroplasty without immediate postoperative complication. Electronically Signed   By: Keith Rake M.D.   On: 08/13/2020 15:19   DG C-Arm 1-60 Min  Result Date: 08/13/2020 CLINICAL DATA:  Surgery, elective. Additional history provided: Left anterior hip. Provided fluoroscopy time 9 seconds (0.70 mGy).  EXAM: OPERATIVE left HIP (WITH PELVIS IF PERFORMED) 4 VIEWS TECHNIQUE: Fluoroscopic spot image(s) were submitted for interpretation post-operatively. COMPARISON:  Radiographs of the left hip 08/11/2020. FINDINGS: Four intraoperative fluoroscopic images of the left hip are submitted. On the provided images, there are findings of interval left total hip arthroplasty. The femoral and acetabular components appear well seated. No unexpected finding on the provided views. IMPRESSION: Four intraoperative fluoroscopic images of the left hip from left hip total arthroplasty, as described. Electronically Signed   By: Kellie Simmering DO   On: 08/13/2020 14:18   DG HIP OPERATIVE UNILAT WITH PELVIS LEFT  Result Date: 08/13/2020 CLINICAL DATA:  Surgery, elective. Additional history provided: Left anterior hip. Provided fluoroscopy time 9 seconds (0.70 mGy). EXAM: OPERATIVE left HIP (WITH PELVIS IF PERFORMED) 4 VIEWS TECHNIQUE: Fluoroscopic spot image(s) were submitted for interpretation post-operatively. COMPARISON:  Radiographs of the left hip 08/11/2020. FINDINGS: Four intraoperative fluoroscopic images of the left hip are submitted. On the provided images, there are findings of interval left total hip arthroplasty. The femoral and acetabular components appear well seated. No unexpected finding on the provided views. IMPRESSION: Four intraoperative fluoroscopic images of the left hip from left hip total arthroplasty, as described. Electronically Signed   By: Kellie Simmering DO   On: 08/13/2020 14:18     LOS: 3 days   Bonnell Public, MD [facility]  08/14/2020, 4:47 PM

## 2020-08-14 NOTE — Progress Notes (Signed)
ANTICOAGULATION CONSULT NOTE - Follow-Up Consult  Pharmacy Consult for Coumadin and Heparin Indication: Mechanical heart valve, AFib  Allergies  Allergen Reactions  . Tape Itching    Redness, Please use "paper" tape only    Patient Measurements: Height: '5\' 7"'$  (170.2 cm) Weight: 52.2 kg (115 lb) IBW/kg (Calculated) : 61.6   Vital Signs: Temp: 98.1 F (36.7 C) (05/28 0739) Temp Source: Oral (05/28 0739) BP: 117/49 (05/28 0739) Pulse Rate: 93 (05/28 0739)  Labs: Recent Labs    08/12/20 0049 08/12/20 0819 08/12/20 1737 08/12/20 1902 08/12/20 2146 08/13/20 0022 08/13/20 0205 08/13/20 1608 08/14/20 0220  HGB 10.4*  --   --   --   --  9.8*  --  10.0* 8.7*  HCT 31.5*  --   --   --   --  30.1*  --  31.7* 27.3*  PLT 144*  --   --   --   --  137*  --  146* 128*  LABPROT 25.3*  --   --   --  18.0*  --   --   --  15.3*  INR 2.3*  --   --   --  1.5*  --   --   --  1.2  HEPARINUNFRC <0.10*   < > <0.10*  --   --  0.32 0.31  --   --   CREATININE  --   --   --  1.82*  --   --   --  1.62* 1.53*   < > = values in this interval not displayed.    Estimated Creatinine Clearance: 23.4 mL/min (A) (by C-G formula based on SCr of 1.53 mg/dL (H)).   Medical History: Past Medical History:  Diagnosis Date  . Anxiety   . Diabetes mellitus with neuropathy ([place])   . Diabetic neuropathy ([place])   . DJD (degenerative joint disease)   . H/O aortic valve replacement   . Hyperlipidemia   . Hypertension   . Neuromuscular disorder ([facility])    neuropathy in feet  . Obesity   . Sleep apnea   . Stroke [facility])    " light stroke"  . Wears glasses     Assessment: 63 yoF admitted from independent living facility s/p mechanical fall. On warfarin PTA for mechanical AVR and AFib. INR reversed with vitamin K 10 mg IV on 5/26. 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 Pharmacy consulted to resume warfarin and resume heparin bridge 24 hours post-op.   INR subtherapeutic at 1.2. Hgb slightly  down 8.7, plt low 128. Prophylactic enoxaparin given this AM.    PTA warfarin 3 mg daily (confirmed with [facility] [place])  Goal of Therapy:  INR goal 2.5-3.5 Heparin level goal 0.5-0.7 Monitor platelets by anticoagulation protocol: Yes   Plan:  Discontinue enoxaparin Warfarin 6 mg PO once Start heparin infusion at 1200 units/hr at 1400 today, no bolus 8 hr heparin level Daily INR, HL, CBC, and s/sx of bleeding  Berenice Bouton, PharmD, BCPS PGY2 Pharmacy Resident Phone between 7 am - 3:30 pm: [phone]  Please check AMION for all [place] phone numbers After 10:00 PM, call [place] [phone]  08/14/2020,11:15 AM

## 2020-08-15 LAB — CBC WITH DIFFERENTIAL/PLATELET
Abs Immature Granulocytes: 0.02 10*3/uL (ref 0.00–0.07)
Basophils Absolute: 0.1 10*3/uL (ref 0.0–0.1)
Basophils Relative: 1 %
Eosinophils Absolute: 0.7 10*3/uL — ABNORMAL HIGH (ref 0.0–0.5)
Eosinophils Relative: 10 %
HCT: 24.5 % — ABNORMAL LOW (ref 36.0–46.0)
Hemoglobin: 7.6 g/dL — ABNORMAL LOW (ref 12.0–15.0)
Immature Granulocytes: 0 %
Lymphocytes Relative: 12 %
Lymphs Abs: 0.8 10*3/uL (ref 0.7–4.0)
MCH: 34.4 pg — ABNORMAL HIGH (ref 26.0–34.0)
MCHC: 31 g/dL (ref 30.0–36.0)
MCV: 110.9 fL — ABNORMAL HIGH (ref 80.0–100.0)
Monocytes Absolute: 0.8 10*3/uL (ref 0.1–1.0)
Monocytes Relative: 12 %
Neutro Abs: 4.2 10*3/uL (ref 1.7–7.7)
Neutrophils Relative %: 65 %
Platelets: 120 10*3/uL — ABNORMAL LOW (ref 150–400)
RBC: 2.21 MIL/uL — ABNORMAL LOW (ref 3.87–5.11)
RDW: 15.3 % (ref 11.5–15.5)
WBC: 6.6 10*3/uL (ref 4.0–10.5)
nRBC: 0 % (ref 0.0–0.2)

## 2020-08-15 LAB — BASIC METABOLIC PANEL
Anion gap: 6 (ref 5–15)
BUN: 39 mg/dL — ABNORMAL HIGH (ref 8–23)
CO2: 21 mmol/L — ABNORMAL LOW (ref 22–32)
Calcium: 8.1 mg/dL — ABNORMAL LOW (ref 8.9–10.3)
Chloride: 107 mmol/L (ref 98–111)
Creatinine, Ser: 1.71 mg/dL — ABNORMAL HIGH (ref 0.44–1.00)
GFR, Estimated: 30 mL/min — ABNORMAL LOW (ref >60)
Glucose, Bld: 149 mg/dL — ABNORMAL HIGH (ref 70–99)
Potassium: 4.8 mmol/L (ref 3.5–5.1)
Sodium: 134 mmol/L — ABNORMAL LOW (ref 135–145)

## 2020-08-15 LAB — PROTIME-INR
INR: 1.9 — ABNORMAL HIGH (ref 0.8–1.2)
Prothrombin Time: 21.9 seconds — ABNORMAL HIGH (ref 11.4–15.2)

## 2020-08-15 LAB — HEPARIN LEVEL (UNFRACTIONATED)
Heparin Unfractionated: 0.31 IU/mL (ref 0.30–0.70)
Heparin Unfractionated: 0.21 IU/mL — ABNORMAL LOW (ref 0.30–0.70)

## 2020-08-15 MED ORDER — WARFARIN SODIUM 6 MG PO TABS
6.0000 mg | ORAL_TABLET | Freq: Once | ORAL | Status: AC
  Administered 2020-08-15: 6 mg via ORAL
  Filled 2020-08-15: qty 1

## 2020-08-15 NOTE — Progress Notes (Signed)
ANTICOAGULATION CONSULT NOTE - Follow-Up Consult  Pharmacy Consult for Coumadin and Heparin Indication: Mechanical heart valve, AFib  Allergies  Allergen Reactions  . Tape Itching    Redness, Please use "paper" tape only    Patient Measurements: Height: '5\' 7"'$  (170.2 cm) Weight: 52.2 kg (115 lb) IBW/kg (Calculated) : 61.6   Vital Signs: Temp: 97.7 F (36.5 C) (05/29 1557) Temp Source: Oral (05/29 1557) BP: 108/51 (05/29 1557) Pulse Rate: 88 (05/29 1557)  Labs: Recent Labs    08/12/20 2146 08/13/20 0022 08/14/20 0220 08/14/20 2310 08/15/20 0628 08/15/20 1455 08/15/20 1641  HGB  --    < > 8.7* 7.9*  --  7.6*  --   HCT  --    < > 27.3* 24.2*  --  24.5*  --   PLT  --    < > 128* 134*  --  120*  --   LABPROT 18.0*  --  15.3* 18.9*  --   --   --   INR 1.5*  --  1.2 1.6*  --   --   --   HEPARINUNFRC  --    < >  --  0.33 0.31  --  0.21*  CREATININE  --    < > 1.53* 1.79*  --  1.71*  --    < > = values in this interval not displayed.    Estimated Creatinine Clearance: 20.9 mL/min (A) (by C-G formula based on SCr of 1.71 mg/dL (H)).   Medical History: Past Medical History:  Diagnosis Date  . Anxiety   . Diabetes mellitus with neuropathy ([place])   . Diabetic neuropathy ([place])   . DJD (degenerative joint disease)   . H/O aortic valve replacement   . Hyperlipidemia   . Hypertension   . Neuromuscular disorder (HCC)    neuropathy in feet  . Obesity   . Sleep apnea   . Stroke [facility])    " light stroke"  . Wears glasses     Assessment: 1 yoF admitted from independent living facility s/p mechanical fall. On warfarin PTA for mechanical AVR and AFib. INR reversed with vitamin K 10 mg IV on 5/26. 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 Pharmacy consulted to resume warfarin and resume heparin bridge 24 hours post-op.   Heparin level below goal at 0.21. Hgb slightly down 7.6, plt 120. No bleeding noted per RN. Per RN, heparin was off for potentially an hour,  restart ~1400 (level drawn at 1641).   PTA warfarin 3 mg daily (confirmed with [facility] [place])  Goal of Therapy:  INR goal 2.5-3.5 Heparin level goal 0.5-0.7 Monitor platelets by anticoagulation protocol: Yes   Plan:  Increase heparin infusion rate to 1350 units/hr 8 hr heparin level Warfarin 6 mg PO ordered for tonight Daily INR, HL, CBC, and s/sx of bleeding  Antonietta Jewel, PharmD, [place] Pharmacist  Phone: [phone] 08/15/2020 6:05 PM  Please check AMION for all [place] phone numbers After 10:00 PM, call [place] [phone]

## 2020-08-15 NOTE — Progress Notes (Signed)
PROGRESS NOTE    Mary [name]  [id number] DOB: [birth year] DOA: 08/11/2020 PCP: Bonnita Nasuti, MD   Chief Complaint  Patient presents with  . Fall  Brief Narrative: 80 year old female with persistent A. fib, chronic diastolic CHF, recurrent pleural effusion, mechanical aortic valve in 1995 on Coumadin, HTN, HLD, OSA, psoriasis, orthostatic hypotension, history of CVA, T2DM lives independently at [facility] presented after a fall and is having hip pain after she tripped over the cord while vacuuming her floor and fell onto her left side.  She had no loss of consciousness.  She was seen in the ED INR 2.2, creatinine 1.3 hemoglobin 11.8, x-ray showed left hip fracture.  Anesthesia had any pain advised transferred to [facility] for cardiology evaluation Dr. Erlinda Hong from orthopedic was contacted and transferred. Seen by cardiology in preop evaluation echocardiogram no significant changes and okay to proceed with surgery 5/27. Patient was bridged to heparin preoperatively.  08/14/2020: Patient same.  Patient underwent prosthetic replacement for femoral neck fracture yesterday.  Patient is currently on heparin drip.  Coumadin has been started.  Input from orthopedic and cardiology teams are highly appreciated. 08/15/2020: Patient seen.  No new complaints.  No INR visualized for today.  We will order INR daily.  Patient remains on both heparin drip and Coumadin.  Last INR was 1.6 yesterday.  Subjective: -Pain is controlled. -No shortness of breath. -No chest pain  Assessment & Plan:  Left hip fracture/posterior malleolar fracture secondary to mechanical fall: Seen by orthopedic planning for operative intervention of the left hip and nonoperative management of the malleolar fracture after cardiology clearance.  Echo p no significant change from prior echo and patient going for surgery today.   Continue pain control with oral and IV opiates.  High risk for intubation/CVA so cardiology strongly encouraged  regional anesthesia, along with further recommendation (fluid, as needed IV Lasix, dobutamine for RV support in case of perioperative cardiogenic shock.  Defer to cardio/orthopedics. 08/14/2020: Patient underwent prosthetic replacement for femoral neck fracture yesterday.  Orthopedic team is directing postop management as well as cardiology team.  Patient is currently on heparin drip.  Coumadin has been started. 07/16/2020: Continue heparin and Coumadin for today.  Check INR daily.  Mechanical AVR since 1995 on long-term anticoagulation with Coumadin: Heart rate is controlled, continue with heparin bridge resume Coumadin once okay with orthopedics.  Being bridged with heparin holding Coumadin.  Continue as per cardiology pharmacy managing. 08/14/2020: See above documentation.  Persistent A. fib longstanding heart rate goal less than 120 avoiding CCB or BB, can use  a single dose of digoxin  for acute rate control along with beta-blocker p.o. every 6 hours  If needed  and uptitrate as needed okay to use short-term amiodarone infusion if blood pressure limits use of beta-blocker  History of combined systolic and diastolic CHF/mild pulmonary hypertension-plan per cardiology see above, echo no new changes.  Volume status is stable, on Demadex. 08/14/2020: Stable.  T2DM: blood sugar well controlled A1c 5.1 in 2021 Recent Labs  Lab 08/11/20 1914 08/13/20 1045 08/13/20 1414  GLUCAP 99 97 100*   Chronic anemia downtrending monitor closely in the setting of Coumadin Heparin May need transfusion Recent Labs  Lab 08/13/20 0022 08/13/20 1608 08/14/20 0220 08/14/20 2310 08/15/20 1455  HGB 9.8* 10.0* 8.7* 7.9* 7.6*  HCT 30.1* 31.7* 27.3* 24.2* 24.5*   OSA noncompliant with CPAP  HLD continue simvastatin History of CVA-stable.  Anticoagulation as above Cirrhosis-stable CKD stage IIIb monitor renal function.  Renal function overall stable. Baseline creatinine as high as 2 in the past. Recent Labs     12/08/19 0042 01/15/20 1325 01/16/20 0455 01/17/20 0600 02/11/20 1311 02/18/20 1830 08/11/20 1509 08/12/20 1902 08/13/20 1608 08/14/20 0220 08/14/20 2310  BUN 37* 45* 31* 23 26* 22 24* 30*  --  30* 38*  CREATININE 1.35* 2.01* 1.39* 1.16* 1.55* 1.26* 1.31* 1.82* 1.62* 1.53* 1.79*   Moderate protein calorie malnutrition - augment diet as below dietitian on board Nutrition Problem: Moderate Malnutrition Etiology: social / environmental circumstances Signs/Symptoms: energy intake < or equal to 75% for > or equal to 1 month,mild fat depletion,mild muscle depletion Interventions: Ensure Enlive (each supplement provides 350kcal and 20 grams of protein),MVI   Diet Order            Diet Heart Room service appropriate? Yes; Fluid consistency: Thin  Diet effective now                 Patient's Body mass index is 18.01 kg/m.  DVT prophylaxis: SCDs Start: 08/13/20 1541heparin gtt.  Coumadin started today, 08/14/2020. Code Status:   Code Status: Full Code  Family Communication: plan of care discussed with patient and her husband at bedside.  Status is: Inpatient  Remains inpatient appropriate because:IV treatments appropriate due to intensity of illness or inability to take PO and For management of hip fracture Dispo: The patient is from: Independent living facility at [facility]              Anticipated d/c is to: SNF              Patient currently is not medically stable to d/c.   Difficult to place patient No Unresulted Labs (From admission, onward)          Start     Ordered   08/20/20 0500  Creatinine, serum  (BRIDGE to warfarin (includes Lovenox or Heparin -AND- warfarin pharmacy consult))  Weekly,   R     Comments: while on enoxaparin therapy.   Question:  Specimen collection method  Answer:  Lab=Lab collect   08/13/20 1540   08/16/20 0500  Heparin level (unfractionated)  Daily,   R     Question:  Specimen collection method  Answer:  Lab=Lab collect   08/14/20 1438    08/15/20 1630  Heparin level (unfractionated)  Once-Timed,   TIMED       Question:  Specimen collection method  Answer:  Lab=Lab collect   08/15/20 0804   08/15/20 1620  Protime-INR  Daily,   R     Question:  Specimen collection method  Answer:  Lab=Lab collect   08/15/20 1619   08/15/20 [id number]  Basic metabolic panel  Once,   R       Question:  Specimen collection method  Answer:  Lab=Lab collect   08/15/20 1233   08/14/20 0500  Protime-INR  (BRIDGE to warfarin (includes Lovenox or Heparin -AND- warfarin pharmacy consult))  Daily,   R     Question:  Specimen collection method  Answer:  Lab=Lab collect   08/13/20 1124   08/12/20 0500  CBC  Daily,   R      08/11/20 1708         Medications reviewed:  Scheduled Meds: . sodium chloride   Intravenous Once  . buPROPion  150 mg Oral Daily  . busPIRone  5 mg Oral BID  . docusate sodium  100 mg Oral BID  . feeding supplement  237 mL Oral  BID BM  . lactulose  20 g Oral Daily  . multivitamin with minerals  1 tablet Oral Daily  . pantoprazole  40 mg Oral QAC breakfast  . torsemide  20 mg Oral Daily  . warfarin  6 mg Oral ONCE-1600  . Warfarin - Pharmacist Dosing Inpatient   Does not apply q1600   Continuous Infusions: . sodium chloride 75 mL/hr at 08/15/20 0805  . heparin 1,300 Units/hr (08/15/20 0806)  . lactated ringers 10 mL/hr at 08/13/20 1044  . methocarbamol (ROBAXIN) IV      Consultants:see note  Procedures:see note  Antimicrobials: Anti-infectives (From admission, onward)   Start     Dose/Rate Route Frequency Ordered Stop   08/14/20 0000  ceFAZolin (ANCEF) IVPB 2g/100 mL premix        2 g 200 mL/hr over 30 Minutes Intravenous Every 12 hours 08/13/20 1540 08/14/20 1613   08/13/20 1254  vancomycin (VANCOCIN) powder  Status:  Discontinued          As needed 08/13/20 1254 08/13/20 1408   08/13/20 0600  ceFAZolin (ANCEF) IVPB 2g/100 mL premix        2 g 200 mL/hr over 30 Minutes Intravenous On call to O.R. 08/12/20 1831  08/13/20 1200     Culture/Microbiology    Component Value Date/Time   SDES PERITONEAL ABDOMEN 10/31/2019 1657   SDES PERITONEAL ABDOMEN 10/31/2019 1657   SPECREQUEST BOTTLES DRAWN AEROBIC AND ANAEROBIC 10/31/2019 1657   SPECREQUEST NONE 10/31/2019 1657   CULT  10/31/2019 1657    NO GROWTH 5 DAYS Performed at [facility], [place] [address], [place], [place] [address]    REPTSTATUS 11/05/2019 FINAL 10/31/2019 1657   REPTSTATUS 10/31/2019 FINAL 10/31/2019 1657    Other culture-see note  Objective: Vitals: Today's Vitals   08/15/20 0813 08/15/20 1051 08/15/20 1136 08/15/20 1557  BP: 102/63   (!) 108/51  Pulse: 97   88  Resp: 16   17  Temp: 97.8 F (36.6 C)   97.7 F (36.5 C)  TempSrc: Oral   Oral  SpO2: 94%   100%  Weight:      Height:      PainSc:  8  Asleep     Intake/Output Summary (Last 24 hours) at 08/15/2020 1619 Last data filed at 08/15/2020 1100 Gross per 24 hour  Intake 1903.99 ml  Output --  Net 1903.99 ml   Filed Weights   08/11/20 1126 08/13/20 1039  Weight: 52.2 kg 52.2 kg   Weight change:   Intake/Output from previous day: 05/28 0701 - 05/29 0700 In: 2144 [P.O.:480; I.V.:1664] Out: -  Intake/Output this shift: Total I/O In: 240 [P.O.:240] Out: -  Filed Weights   08/11/20 1126 08/13/20 1039  Weight: 52.2 kg 52.2 kg    Examination: General exam: AAOx 3, elderly lady, frail.  HEENT: Mild pallor. Respiratory system: Clear to auscultation.   Cardiovascular system: S1 & S2, systolic murmur at the apex.  Mechanical heart valve sounds heard at the thoracic area with systolic murmur.  Gastrointestinal system: Abdomen soft,NT,ND, BS+ Nervous System:Alert, awake, moving extremities, tender on  lft hip Extremities: no edema.  Data Reviewed: I have personally reviewed following labs and imaging studies CBC: Recent Labs  Lab 08/13/20 0022 08/13/20 1608 08/14/20 0220 08/14/20 2310 08/15/20 1455  WBC 6.9 6.6 6.9 7.6 6.6  NEUTROABS  --   --    --  5.1 PENDING  HGB 9.8* 10.0* 8.7* 7.9* 7.6*  HCT 30.1* 31.7* 27.3* 24.2* 24.5*  MCV 105.6* 107.5* 107.1* 106.6* 110.9*  PLT 137* 146* 128* 134* [id number]*   Basic Metabolic Panel: Recent Labs  Lab 08/11/20 1509 08/12/20 1902 08/13/20 1608 08/14/20 0220 08/14/20 2310  NA 135 136  --  133* 133*  K 3.8 4.4  --  4.4 4.3  CL 100 102  --  100 104  CO2 25 26  --  22 21*  GLUCOSE 100* 125*  --  122* 140*  BUN 24* 30*  --  30* 38*  CREATININE 1.31* 1.82* 1.62* 1.53* 1.79*  CALCIUM 9.7 8.7*  --  8.3* 8.0*   GFR: Estimated Creatinine Clearance: 20 mL/min (A) (by C-G formula based on SCr of 1.79 mg/dL (H)). Liver Function Tests: Recent Labs  Lab 08/11/20 1509  AST 28  ALT 16  ALKPHOS 109  BILITOT 1.2  PROT 9.0*  ALBUMIN 4.0   No results for input(s): LIPASE, AMYLASE in the last 168 hours. No results for input(s): AMMONIA in the last 168 hours. Coagulation Profile: Recent Labs  Lab 08/11/20 1509 08/12/20 0049 08/12/20 2146 08/14/20 0220 08/14/20 2310  INR 2.2* 2.3* 1.5* 1.2 1.6*   Cardiac Enzymes: No results for input(s): CKTOTAL, CKMB, CKMBINDEX, TROPONINI in the last 168 hours. BNP (last 3 results) No results for input(s): PROBNP in the last 8760 hours. HbA1C: No results for input(s): HGBA1C in the last 72 hours. CBG: Recent Labs  Lab 08/11/20 1914 08/13/20 1045 08/13/20 1414  GLUCAP 99 97 100*   Lipid Profile: No results for input(s): CHOL, HDL, LDLCALC, TRIG, CHOLHDL, LDLDIRECT in the last 72 hours. Thyroid Function Tests: No results for input(s): TSH, T4TOTAL, FREET4, T3FREE, THYROIDAB in the last 72 hours. Anemia Panel: No results for input(s): VITAMINB12, FOLATE, FERRITIN, TIBC, IRON, RETICCTPCT in the last 72 hours. Sepsis Labs: Recent Labs  Lab 08/11/20 1509  LATICACIDVEN 1.9    Recent Results (from the past 240 hour(s))  Resp Panel by RT-PCR (Flu A&B, Covid) Nasopharyngeal Swab     Status: None   Collection Time: 08/11/20 12:21 PM   Specimen:  Nasopharyngeal Swab; Nasopharyngeal(NP) swabs in vial transport medium  Result Value Ref Range Status   SARS Coronavirus 2 by RT PCR NEGATIVE NEGATIVE Final    Comment: (NOTE) SARS-CoV-2 target nucleic acids are NOT DETECTED.  The SARS-CoV-2 RNA is generally detectable in upper respiratory specimens during the acute phase of infection. The lowest concentration of SARS-CoV-2 viral copies this assay can detect is 138 copies/mL. A negative result does not preclude SARS-Cov-2 infection and should not be used as the sole basis for treatment or other patient management decisions. A negative result may occur with  improper specimen collection/handling, submission of specimen other than nasopharyngeal swab, presence of viral mutation(s) within the areas targeted by this assay, and inadequate number of viral copies(<138 copies/mL). A negative result must be combined with clinical observations, patient history, and epidemiological information. The expected result is Negative.  Fact Sheet for Patients:  EntrepreneurPulse.com.au  Fact Sheet for Healthcare Providers:  IncredibleEmployment.be  This test is no t yet approved or cleared by the [place] FDA and  has been authorized for detection and/or diagnosis of SARS-CoV-2 by FDA under an Emergency Use Authorization (EUA). This EUA will remain  in effect (meaning this test can be used) for the duration of the COVID-19 declaration under Section 564(b)(1) of the Act, 21 U.S.C.section 360bbb-3(b)(1), unless the authorization is terminated  or revoked sooner.       Influenza A by PCR NEGATIVE NEGATIVE Final  Influenza B by PCR NEGATIVE NEGATIVE Final    Comment: (NOTE) The Xpert Xpress SARS-CoV-2/FLU/RSV plus assay is intended as an aid in the diagnosis of influenza from Nasopharyngeal swab specimens and should not be used as a sole basis for treatment. Nasal washings and aspirates are unacceptable for  Xpert Xpress SARS-CoV-2/FLU/RSV testing.  Fact Sheet for Patients: EntrepreneurPulse.com.au  Fact Sheet for Healthcare Providers: IncredibleEmployment.be  This test is not yet approved or cleared by the [place] FDA and has been authorized for detection and/or diagnosis of SARS-CoV-2 by FDA under an Emergency Use Authorization (EUA). This EUA will remain in effect (meaning this test can be used) for the duration of the COVID-19 declaration under Section 564(b)(1) of the Act, 21 U.S.C. section 360bbb-3(b)(1), unless the authorization is terminated or revoked.  Performed at [facility], [address] [place], [place] [address]   Surgical pcr screen     Status: None   Collection Time: 08/12/20  1:00 AM   Specimen: Nasal Mucosa; Nasal Swab  Result Value Ref Range Status   MRSA, PCR NEGATIVE NEGATIVE Final   Staphylococcus aureus NEGATIVE NEGATIVE Final    Comment: (NOTE) The Xpert SA Assay (FDA approved for NASAL specimens in patients 46 years of age and older), is one component of a comprehensive surveillance program. It is not intended to diagnose infection nor to guide or monitor treatment. Performed at [facility], [place] [address], [place], [place] [address]      Radiology Studies: No results found.   LOS: 4 days   Bonnell Public, MD [facility]  08/15/2020, 4:19 PM

## 2020-08-15 NOTE — Progress Notes (Signed)
     Subjective: 2 Days Post-Op Procedure(s) (LRB): ANTERIOR APPROACH HEMI HIP ARTHROPLASTY (Left) Awake, alert and oriented x 2. Left hip hemiarthroplasty , Left posterior malleolus ankle fracture.  Patient reports pain as moderate.    Objective:   VITALS:  Temp:  [97.8 F (36.6 C)-98.4 F (36.9 C)] 97.8 F (36.6 C) (05/29 0813) Pulse Rate:  [88-97] 97 (05/29 0813) Resp:  [15-18] 16 (05/29 0813) BP: (102-117)/(46-63) 102/63 (05/29 0813) SpO2:  [94 %-98 %] 94 % (05/29 0813)  Neurologically intact ABD soft Neurovascular intact Sensation intact distally Intact pulses distally Dorsiflexion/Plantar flexion intact Incision: dressing C/D/I and no drainage   LABS Recent Labs    08/13/20 0022 08/13/20 1608 08/14/20 0220 08/14/20 2310  HGB 9.8* 10.0* 8.7* 7.9*  WBC 6.9 6.6 6.9 7.6  PLT 137* 146* 128* 134*   Recent Labs    08/14/20 0220 08/14/20 2310  NA 133* 133*  K 4.4 4.3  CL 100 104  CO2 22 21*  BUN 30* 38*  CREATININE 1.53* 1.79*  GLUCOSE 122* 140*   Recent Labs    08/14/20 0220 08/14/20 2310  INR 1.2 1.6*     Assessment/Plan: 2 Days Post-Op Procedure(s) (LRB): ANTERIOR APPROACH HEMI HIP ARTHROPLASTY (Left) Left ankle fracture, posterior malleolus, should remain in cam brace to support the left ankle injury and prevent heel cord contracture.  Advance diet Up with therapy Discharge to SNF  Basil Dess 08/15/2020, 12:26 PMPatient ID: Mary [name], female   DOB: 1941-02-12, 80 y.o.   MRN: [id number]

## 2020-08-15 NOTE — Progress Notes (Signed)
ANTICOAGULATION CONSULT NOTE - Follow-Up Consult  Pharmacy Consult for Coumadin and Heparin Indication: Mechanical heart valve, AFib  Allergies  Allergen Reactions  . Tape Itching    Redness, Please use "paper" tape only    Patient Measurements: Height: '5\' 7"'$  (170.2 cm) Weight: 52.2 kg (115 lb) IBW/kg (Calculated) : 61.6   Vital Signs: Temp: 98.4 F (36.9 C) (05/29 0358) Temp Source: Oral (05/29 0358) BP: 117/51 (05/29 0358) Pulse Rate: 95 (05/29 0358)  Labs: Recent Labs    08/12/20 2146 08/13/20 0022 08/13/20 0205 08/13/20 1608 08/14/20 0220 08/14/20 2310 08/15/20 0628  HGB  --    < >  --  10.0* 8.7* 7.9*  --   HCT  --    < >  --  31.7* 27.3* 24.2*  --   PLT  --    < >  --  146* 128* 134*  --   LABPROT 18.0*  --   --   --  15.3* 18.9*  --   INR 1.5*  --   --   --  1.2 1.6*  --   HEPARINUNFRC  --    < > 0.31  --   --  0.33 0.31  CREATININE  --   --   --  1.62* 1.53* 1.79*  --    < > = values in this interval not displayed.    Estimated Creatinine Clearance: 20 mL/min (A) (by C-G formula based on SCr of 1.79 mg/dL (H)).   Medical History: Past Medical History:  Diagnosis Date  . Anxiety   . Diabetes mellitus with neuropathy ([place])   . Diabetic neuropathy ([place])   . DJD (degenerative joint disease)   . H/O aortic valve replacement   . Hyperlipidemia   . Hypertension   . Neuromuscular disorder ([facility])    neuropathy in feet  . Obesity   . Sleep apnea   . Stroke [facility])    " light stroke"  . Wears glasses     Assessment: 50 yoF admitted from independent living facility s/p mechanical fall. On warfarin PTA for mechanical AVR and AFib. INR reversed with vitamin K 10 mg IV on 5/26. 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 Pharmacy consulted to resume warfarin and resume heparin bridge 24 hours post-op.   HL below goal at 0.31. INR subtherapeutic at 1.6. Hgb slightly down 7.9, plt 134. No bleeding noted per RN.  PTA warfarin 3 mg daily (confirmed  with [facility])  Goal of Therapy:  INR goal 2.5-3.5 Heparin level goal 0.5-0.7 Monitor platelets by anticoagulation protocol: Yes   Plan:  Increase heparin infusion rate to 1300 units/hr 8 hr heparin level Warfarin 6 mg PO once tonight Daily INR, HL, CBC, and s/sx of bleeding  Berenice Bouton, PharmD, MS, BCPS PGY2 Pharmacy Resident Phone between 7 am - 3:30 pm: [phone]  Please check AMION for all [place] phone numbers After 10:00 PM, call [place] [phone]  08/15/2020,7:49 AM

## 2020-08-15 NOTE — Plan of Care (Signed)

## 2020-08-16 LAB — CBC
HCT: 24.4 % — ABNORMAL LOW (ref 36.0–46.0)
Hemoglobin: 7.7 g/dL — ABNORMAL LOW (ref 12.0–15.0)
MCH: 34.4 pg — ABNORMAL HIGH (ref 26.0–34.0)
MCHC: 31.6 g/dL (ref 30.0–36.0)
MCV: 108.9 fL — ABNORMAL HIGH (ref 80.0–100.0)
Platelets: 130 10*3/uL — ABNORMAL LOW (ref 150–400)
RBC: 2.24 MIL/uL — ABNORMAL LOW (ref 3.87–5.11)
RDW: 15.4 % (ref 11.5–15.5)
WBC: 7.7 10*3/uL (ref 4.0–10.5)
nRBC: 0 % (ref 0.0–0.2)

## 2020-08-16 LAB — PROTIME-INR
INR: 2.1 — ABNORMAL HIGH (ref 0.8–1.2)
Prothrombin Time: 23.7 seconds — ABNORMAL HIGH (ref 11.4–15.2)

## 2020-08-16 LAB — HEPARIN LEVEL (UNFRACTIONATED)
Heparin Unfractionated: 0.26 IU/mL — ABNORMAL LOW (ref 0.30–0.70)
Heparin Unfractionated: 0.33 IU/mL (ref 0.30–0.70)

## 2020-08-16 MED ORDER — WARFARIN SODIUM 6 MG PO TABS
6.0000 mg | ORAL_TABLET | Freq: Once | ORAL | Status: AC
  Administered 2020-08-16: 6 mg via ORAL
  Filled 2020-08-16: qty 1

## 2020-08-16 NOTE — Plan of Care (Signed)

## 2020-08-16 NOTE — Progress Notes (Signed)
ANTICOAGULATION CONSULT NOTE - Follow-Up Consult  Pharmacy Consult for Coumadin and Heparin Indication: Mechanical heart valve, AFib   Labs: Recent Labs    08/14/20 0220 08/14/20 2310 08/15/20 0628 08/15/20 1455 08/15/20 1641 08/16/20 0138 08/16/20 1015  HGB 8.7* 7.9*  --  7.6*  --  7.7*  --   HCT 27.3* 24.2*  --  24.5*  --  24.4*  --   PLT 128* 134*  --  120*  --  130*  --   LABPROT 15.3* 18.9*  --   --  21.9* 23.7*  --   INR 1.2 1.6*  --   --  1.9* 2.1*  --   HEPARINUNFRC  --  0.33   < >  --  0.21* 0.26* 0.33  CREATININE 1.53* 1.79*  --  1.71*  --   --   --    < > = values in this interval not displayed.     Assessment: 11 yoF admitted from independent living facility s/p mechanical fall. On warfarin PTA for mechanical AVR and AFib. INR reversed with vitamin K 10 mg IV on 5/26. 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 Pharmacy consulted to resume warfarin and resume heparin bridge 24 hours post-op.   Heparin level below goal at 0.33 units/ml.  Hg 7.7, PTLc 130  Goal of Therapy:  INR goal 2.5-3.5 Heparin level goal 0.5-0.7 Monitor platelets by anticoagulation protocol: Yes   Plan:  Increase heparin infusion rate to 1500 units/hr 8 hr heparin level Warfarin '6mg'$  PO x1 Daily INR, HL, CBC, and s/sx of bleeding  Thanks for allowing pharmacy to be a part of this patient's care.  Alfonse Spruce, PharmD PGY2 ID Pharmacy Resident Phone between 7 am - 3:30 pm: [phone]  Please check AMION for all [place] phone numbers After 10:00 PM, call [place] [phone]

## 2020-08-16 NOTE — Plan of Care (Signed)
  Problem: Clinical Measurements: Goal: Ability to maintain clinical measurements within normal limits will improve Outcome: Progressing Goal: Will remain free from infection Outcome: Progressing   

## 2020-08-16 NOTE — Progress Notes (Signed)
ANTICOAGULATION CONSULT NOTE - Follow-Up Consult  Pharmacy Consult for Coumadin and Heparin Indication: Mechanical heart valve, AFib   Labs: Recent Labs    08/14/20 0220 08/14/20 0220 08/14/20 2310 08/15/20 0628 08/15/20 1455 08/15/20 1641 08/16/20 0138  HGB 8.7*  --  7.9*  --  7.6*  --  7.7*  HCT 27.3*  --  24.2*  --  24.5*  --  24.4*  PLT 128*  --  134*  --  120*  --  130*  LABPROT 15.3*  --  18.9*  --   --  21.9* 23.7*  INR 1.2  --  1.6*  --   --  1.9* 2.1*  HEPARINUNFRC  --    < > 0.33 0.31  --  0.21* 0.26*  CREATININE 1.53*  --  1.79*  --  1.71*  --   --    < > = values in this interval not displayed.     Assessment: 60 yoF admitted from independent living facility s/p mechanical fall. On warfarin PTA for mechanical AVR and AFib. INR reversed with vitamin K 10 mg IV on 5/26. 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 Pharmacy consulted to resume warfarin and resume heparin bridge 24 hours post-op.   Heparin level below goal at 0.26 units/ml.  Hg 7.6, PTLC 130  Goal of Therapy:  INR goal 2.5-3.5 Heparin level goal 0.3-0.7 Monitor platelets by anticoagulation protocol: Yes   Plan:  Increase heparin infusion rate to 1450 units/hr 6 hr heparin level Daily INR, HL, CBC, and s/sx of bleeding  Thanks for allowing pharmacy to be a part of this patient's care.  Excell Seltzer, PharmD Clinical Pharmacist

## 2020-08-16 NOTE — Plan of Care (Signed)

## 2020-08-16 NOTE — Progress Notes (Signed)
PROGRESS NOTE    Mary [name]  [id number] DOB: 1940-08-24 DOA: 08/11/2020 PCP: Bonnita Nasuti, MD   Chief Complaint  Patient presents with  . Fall  Brief Narrative: 80 year old female with persistent A. fib, chronic diastolic CHF, recurrent pleural effusion, mechanical aortic valve in 1995 on Coumadin, HTN, HLD, OSA, psoriasis, orthostatic hypotension, history of CVA, T2DM lives independently at [place] presented after a fall and is having hip pain after she tripped over the cord while vacuuming her floor and fell onto her left side.  She had no loss of consciousness.  She was seen in the ED INR 2.2, creatinine 1.3 hemoglobin 11.8, x-ray showed left hip fracture.  Anesthesia had any pain advised transferred to [facility] for cardiology evaluation Dr. Erlinda Hong from orthopedic was contacted and transferred. Seen by cardiology in preop evaluation echocardiogram no significant changes and okay to proceed with surgery 5/27. Patient was bridged to heparin preoperatively.  08/14/2020: Patient same.  Patient underwent prosthetic replacement for femoral neck fracture yesterday.  Patient is currently on heparin drip.  Coumadin has been started.  Input from orthopedic and cardiology teams are highly appreciated. 08/15/2020: Patient seen.  No new complaints.  No INR visualized for today.  We will order INR daily.  Patient remains on both heparin drip and Coumadin.  Last INR was 1.6 yesterday.  08/16/2020: Patient seen.  No new changes.  INR today is 2.1.  Goal INR is 2.5-3.5.  Patient is on both heparin drip and Coumadin.  Pharmacy input is highly appreciated.  Subjective: -Pain is controlled. -No shortness of breath. -No chest pain  Assessment & Plan:  Left hip fracture/posterior malleolar fracture secondary to mechanical fall: Seen by orthopedic planning for operative intervention of the left hip and nonoperative management of the malleolar fracture after cardiology clearance.  Echo p no significant  change from prior echo and patient going for surgery today.   Continue pain control with oral and IV opiates.  High risk for intubation/CVA so cardiology strongly encouraged regional anesthesia, along with further recommendation (fluid, as needed IV Lasix, dobutamine for RV support in case of perioperative cardiogenic shock.  Defer to cardio/orthopedics. 08/14/2020: Patient underwent prosthetic replacement for femoral neck fracture yesterday.  Orthopedic team is directing postop management as well as cardiology team.  Patient is currently on heparin drip.  Coumadin has been started. 07/16/2020: Continue heparin and Coumadin for today.  Check INR daily.  Mechanical AVR since 1995 on long-term anticoagulation with Coumadin: Heart rate is controlled, continue with heparin bridge resume Coumadin once okay with orthopedics.  Being bridged with heparin holding Coumadin.  Continue as per cardiology pharmacy managing. 08/14/2020: See above documentation. 08/16/2020: INR today is 2.1.  Goal INR is 2.5-2.5.  Persistent A. fib longstanding heart rate goal less than 120 avoiding CCB or BB, can use  a single dose of digoxin  for acute rate control along with beta-blocker p.o. every 6 hours  If needed  and uptitrate as needed okay to use short-term amiodarone infusion if blood pressure limits use of beta-blocker  History of combined systolic and diastolic CHF/mild pulmonary hypertension-plan per cardiology see above, echo no new changes.  Volume status is stable, on Demadex. 08/14/2020: Stable.  T2DM: blood sugar well controlled A1c 5.1 in 2021 Recent Labs  Lab 08/11/20 1914 08/13/20 1045 08/13/20 1414  GLUCAP 99 97 100*   Chronic anemia downtrending monitor closely in the setting of Coumadin Heparin May need transfusion Recent Labs  Lab 08/13/20 1608 08/14/20 0220 08/14/20  2310 08/15/20 1455 08/16/20 0138  HGB 10.0* 8.7* 7.9* 7.6* 7.7*  HCT 31.7* 27.3* 24.2* 24.5* 24.4*   OSA noncompliant with  CPAP  HLD continue simvastatin History of CVA-stable.  Anticoagulation as above Cirrhosis-stable CKD stage IIIb monitor renal function.  Renal function overall stable. Baseline creatinine as high as 2 in the past. Recent Labs    01/15/20 1325 01/16/20 0455 01/17/20 0600 02/11/20 1311 02/18/20 1830 08/11/20 1509 08/12/20 1902 08/13/20 1608 08/14/20 0220 08/14/20 2310 08/15/20 1455  BUN 45* 31* 23 26* 22 24* 30*  --  30* 38* 39*  CREATININE 2.01* 1.39* 1.16* 1.55* 1.26* 1.31* 1.82* 1.62* 1.53* 1.79* 1.71*   Moderate protein calorie malnutrition - augment diet as below dietitian on board Nutrition Problem: Moderate Malnutrition Etiology: social / environmental circumstances Signs/Symptoms: energy intake < or equal to 75% for > or equal to 1 month,mild fat depletion,mild muscle depletion Interventions: Ensure Enlive (each supplement provides 350kcal and 20 grams of protein),MVI   Diet Order            Diet Heart Room service appropriate? Yes; Fluid consistency: Thin  Diet effective now                 Patient's Body mass index is 18.01 kg/m.  DVT prophylaxis: SCDs Start: 08/13/20 1541heparin gtt.  Coumadin started today, 08/14/2020. Code Status:   Code Status: Full Code  Family Communication: plan of care discussed with patient and her husband at bedside.  Status is: Inpatient  Remains inpatient appropriate because:IV treatments appropriate due to intensity of illness or inability to take PO and For management of hip fracture Dispo: The patient is from: Independent living facility at [place]              Anticipated d/c is to: SNF              Patient currently is not medically stable to d/c.   Difficult to place patient No Unresulted Labs (From admission, onward)          Start     Ordered   08/20/20 0500  Creatinine, serum  (BRIDGE to warfarin (includes Lovenox or Heparin -AND- warfarin pharmacy consult))  Weekly,   R     Comments: while on enoxaparin  therapy.   Question:  Specimen collection method  Answer:  Lab=Lab collect   08/13/20 1540   08/17/20 0500  Heparin level (unfractionated)  Daily,   R     Question:  Specimen collection method  Answer:  Lab=Lab collect   08/15/20 1807   08/15/20 1620  Protime-INR  Daily,   R     Question:  Specimen collection method  Answer:  Lab=Lab collect   08/15/20 1619   08/14/20 0500  Protime-INR  (BRIDGE to warfarin (includes Lovenox or Heparin -AND- warfarin pharmacy consult))  Daily,   R     Question:  Specimen collection method  Answer:  Lab=Lab collect   08/13/20 1124   08/12/20 0500  CBC  Daily,   R      08/11/20 1708         Medications reviewed:  Scheduled Meds: . sodium chloride   Intravenous Once  . buPROPion  150 mg Oral Daily  . busPIRone  5 mg Oral BID  . docusate sodium  100 mg Oral BID  . feeding supplement  237 mL Oral BID BM  . lactulose  20 g Oral Daily  . multivitamin with minerals  1 tablet Oral Daily  .  pantoprazole  40 mg Oral QAC breakfast  . torsemide  20 mg Oral Daily  . Warfarin - Pharmacist Dosing Inpatient   Does not apply q1600   Continuous Infusions: . sodium chloride 75 mL/hr at 08/16/20 1534  . heparin 1,500 Units/hr (08/16/20 1534)  . lactated ringers 10 mL/hr at 08/13/20 1044  . methocarbamol (ROBAXIN) IV      Consultants:see note  Procedures:see note  Antimicrobials: Anti-infectives (From admission, onward)   Start     Dose/Rate Route Frequency Ordered Stop   08/14/20 0000  ceFAZolin (ANCEF) IVPB 2g/100 mL premix        2 g 200 mL/hr over 30 Minutes Intravenous Every 12 hours 08/13/20 1540 08/14/20 1613   08/13/20 1254  vancomycin (VANCOCIN) powder  Status:  Discontinued          As needed 08/13/20 1254 08/13/20 1408   08/13/20 0600  ceFAZolin (ANCEF) IVPB 2g/100 mL premix        2 g 200 mL/hr over 30 Minutes Intravenous On call to O.R. 08/12/20 1831 08/13/20 1200     Culture/Microbiology    Component Value Date/Time   SDES PERITONEAL  ABDOMEN 10/31/2019 1657   SDES PERITONEAL ABDOMEN 10/31/2019 1657   SPECREQUEST BOTTLES DRAWN AEROBIC AND ANAEROBIC 10/31/2019 1657   SPECREQUEST NONE 10/31/2019 1657   CULT  10/31/2019 1657    NO GROWTH 5 DAYS Performed at [facility], [place] [address] [place], [place] [address]    REPTSTATUS 11/05/2019 FINAL 10/31/2019 1657   REPTSTATUS 10/31/2019 FINAL 10/31/2019 1657    Other culture-see note  Objective: Vitals: Today's Vitals   08/16/20 1458 08/16/20 1500 08/16/20 1534 08/16/20 1652  BP:  (!) 116/48    Pulse:  97    Resp:  18    Temp:  97.8 F (36.6 C)    TempSrc:  Oral    SpO2:  99%    Weight:      Height:      PainSc: Asleep  0-No pain Asleep    Intake/Output Summary (Last 24 hours) at 08/16/2020 1701 Last data filed at 08/16/2020 1534 Gross per 24 hour  Intake 2299.96 ml  Output 950 ml  Net 1349.96 ml   Filed Weights   08/11/20 1126 08/13/20 1039  Weight: 52.2 kg 52.2 kg   Weight change:   Intake/Output from previous day: 05/29 0701 - 05/30 0700 In: 2317.9 [P.O.:720; I.V.:1597.9] Out: -  Intake/Output this shift: Total I/O In: 1260.9 [P.O.:360; I.V.:900.9] Out: 950 [Urine:950] Filed Weights   08/11/20 1126 08/13/20 1039  Weight: 52.2 kg 52.2 kg    Examination: General exam: AAOx 3, elderly lady, frail.  HEENT: Mild pallor. Respiratory system: Clear to auscultation.   Cardiovascular system: S1 & S2, systolic murmur at the apex.  Mechanical heart valve sounds heard at the thoracic area with systolic murmur.  Gastrointestinal system: Abdomen soft,NT,ND, BS+ Nervous System:Alert, awake, moving extremities, tender on  lft hip Extremities: no edema.  Data Reviewed: I have personally reviewed following labs and imaging studies CBC: Recent Labs  Lab 08/13/20 1608 08/14/20 0220 08/14/20 2310 08/15/20 1455 08/16/20 0138  WBC 6.6 6.9 7.6 6.6 7.7  NEUTROABS  --   --  5.1 4.2  --   HGB 10.0* 8.7* 7.9* 7.6* 7.7*  HCT 31.7* 27.3* 24.2* 24.5*  24.4*  MCV 107.5* 107.1* 106.6* 110.9* 108.9*  PLT 146* 128* 134* 120* [id number]*   Basic Metabolic Panel: Recent Labs  Lab 08/11/20 1509 08/12/20 1902 08/13/20 1608 08/14/20 0220  08/14/20 2310 08/15/20 1455  NA 135 136  --  133* 133* 134*  K 3.8 4.4  --  4.4 4.3 4.8  CL 100 102  --  100 104 107  CO2 25 26  --  22 21* 21*  GLUCOSE 100* 125*  --  122* 140* 149*  BUN 24* 30*  --  30* 38* 39*  CREATININE 1.31* 1.82* 1.62* 1.53* 1.79* 1.71*  CALCIUM 9.7 8.7*  --  8.3* 8.0* 8.1*   GFR: Estimated Creatinine Clearance: 20.9 mL/min (A) (by C-G formula based on SCr of 1.71 mg/dL (H)). Liver Function Tests: Recent Labs  Lab 08/11/20 1509  AST 28  ALT 16  ALKPHOS 109  BILITOT 1.2  PROT 9.0*  ALBUMIN 4.0   No results for input(s): LIPASE, AMYLASE in the last 168 hours. No results for input(s): AMMONIA in the last 168 hours. Coagulation Profile: Recent Labs  Lab 08/12/20 2146 08/14/20 0220 08/14/20 2310 08/15/20 1641 08/16/20 0138  INR 1.5* 1.2 1.6* 1.9* 2.1*   Cardiac Enzymes: No results for input(s): CKTOTAL, CKMB, CKMBINDEX, TROPONINI in the last 168 hours. BNP (last 3 results) No results for input(s): PROBNP in the last 8760 hours. HbA1C: No results for input(s): HGBA1C in the last 72 hours. CBG: Recent Labs  Lab 08/11/20 1914 08/13/20 1045 08/13/20 1414  GLUCAP 99 97 100*   Lipid Profile: No results for input(s): CHOL, HDL, LDLCALC, TRIG, CHOLHDL, LDLDIRECT in the last 72 hours. Thyroid Function Tests: No results for input(s): TSH, T4TOTAL, FREET4, T3FREE, THYROIDAB in the last 72 hours. Anemia Panel: No results for input(s): VITAMINB12, FOLATE, FERRITIN, TIBC, IRON, RETICCTPCT in the last 72 hours. Sepsis Labs: Recent Labs  Lab 08/11/20 1509  LATICACIDVEN 1.9    Recent Results (from the past 240 hour(s))  Resp Panel by RT-PCR (Flu A&B, Covid) Nasopharyngeal Swab     Status: None   Collection Time: 08/11/20 12:21 PM   Specimen: Nasopharyngeal Swab;  Nasopharyngeal(NP) swabs in vial transport medium  Result Value Ref Range Status   SARS Coronavirus 2 by RT PCR NEGATIVE NEGATIVE Final    Comment: (NOTE) SARS-CoV-2 target nucleic acids are NOT DETECTED.  The SARS-CoV-2 RNA is generally detectable in upper respiratory specimens during the acute phase of infection. The lowest concentration of SARS-CoV-2 viral copies this assay can detect is 138 copies/mL. A negative result does not preclude SARS-Cov-2 infection and should not be used as the sole basis for treatment or other patient management decisions. A negative result may occur with  improper specimen collection/handling, submission of specimen other than nasopharyngeal swab, presence of viral mutation(s) within the areas targeted by this assay, and inadequate number of viral copies(<138 copies/mL). A negative result must be combined with clinical observations, patient history, and epidemiological information. The expected result is Negative.  Fact Sheet for Patients:  EntrepreneurPulse.com.au  Fact Sheet for Healthcare Providers:  IncredibleEmployment.be  This test is no t yet approved or cleared by the [place] FDA and  has been authorized for detection and/or diagnosis of SARS-CoV-2 by FDA under an Emergency Use Authorization (EUA). This EUA will remain  in effect (meaning this test can be used) for the duration of the COVID-19 declaration under Section 564(b)(1) of the Act, 21 U.S.C.section 360bbb-3(b)(1), unless the authorization is terminated  or revoked sooner.       Influenza A by PCR NEGATIVE NEGATIVE Final   Influenza B by PCR NEGATIVE NEGATIVE Final    Comment: (NOTE) The Xpert Xpress SARS-CoV-2/FLU/RSV plus assay is  intended as an aid in the diagnosis of influenza from Nasopharyngeal swab specimens and should not be used as a sole basis for treatment. Nasal washings and aspirates are unacceptable for Xpert Xpress  SARS-CoV-2/FLU/RSV testing.  Fact Sheet for Patients: EntrepreneurPulse.com.au  Fact Sheet for Healthcare Providers: IncredibleEmployment.be  This test is not yet approved or cleared by the [place] FDA and has been authorized for detection and/or diagnosis of SARS-CoV-2 by FDA under an Emergency Use Authorization (EUA). This EUA will remain in effect (meaning this test can be used) for the duration of the COVID-19 declaration under Section 564(b)(1) of the Act, 21 U.S.C. section 360bbb-3(b)(1), unless the authorization is terminated or revoked.  Performed at [facility], [address], [place], [place] [address]   Surgical pcr screen     Status: None   Collection Time: 08/12/20  1:00 AM   Specimen: Nasal Mucosa; Nasal Swab  Result Value Ref Range Status   MRSA, PCR NEGATIVE NEGATIVE Final   Staphylococcus aureus NEGATIVE NEGATIVE Final    Comment: (NOTE) The Xpert SA Assay (FDA approved for NASAL specimens in patients 29 years of age and older), is one component of a comprehensive surveillance program. It is not intended to diagnose infection nor to guide or monitor treatment. Performed at [facility], [place] [address] [place], [place] [address]      Radiology Studies: No results found.   LOS: 5 days   Bonnell Public, MD [facility]  08/16/2020, 5:01 PM

## 2020-08-16 NOTE — Progress Notes (Signed)
Physical Therapy Treatment Patient Details Name: Mary [name] MRN: [id number] DOB: [birth year] Today's Date: 08/16/2020    History of Present Illness The pt is an 80 yo female presenting from ALF on 5/25 after a fall on her L side that occured when she tripped over the cord while vacuuming. Imaging revealed L hip fx, and pt is now s/p hemiarthroplasty of L hip on 5/27. PMHx significant for Dementia, CHF, A-fib, DM II with neuropathy, Hx aortic valve replacement, HTN, HLD, OA, and Hx of CVA.    PT Comments    Pt complaining of LLE pain, but agreeable to OOB mobility. Pt requiring mod assist for bodyweight support, steadying, and LLE progression during repeated sit<>stands, short-distance gait. Pt with poor safety awareness, especially on first stand and pivot attempt requiring PT assist to guide hips into BSC as pt sitting prematurely. Pt may benefit from ST-SNF placement if continues to require mod assist for mobility tasks.    Follow Up Recommendations  Supervision/Assistance - 24 hour (therapy services at ALF)     Equipment Recommendations  None recommended by PT    Recommendations for Other Services       Precautions / Restrictions Precautions Precautions: Fall Precaution Booklet Issued: No Precaution Comments: per op note, no hip precautions Required Braces or Orthoses: Splint/Cast Splint/Cast: Cam walker on LLE Restrictions Weight Bearing Restrictions: No LLE Weight Bearing: Weight bearing as tolerated    Mobility  Bed Mobility Overal bed mobility: Needs Assistance Bed Mobility: Supine to Sit     Supine to sit: Mod assist;HOB elevated     General bed mobility comments: Mod assist for trunk elevation, LLE progression to EOB, scooting to EOB with weight shifting. Increased time and effort.    Transfers Overall transfer level: Needs assistance Equipment used: Rolling walker (2 wheeled) Transfers: Sit to/from [facility] Sit to Stand: Mod assist Stand  pivot transfers: Mod assist       General transfer comment: Mod assist for momentum-building AP rocking, power up, rise, and steadying. stand pivot to [facility] towards pt's L requiring mod assist to steady, guide pt's buttocks into BSC, pt with uncontrolled descent requiring PT assist to correct. STS x4, from EOB x1, BSC x2, and recliner x1.  Ambulation/Gait Ambulation/Gait assistance: Mod assist Gait Distance (Feet): 5 Feet Assistive device: Rolling walker (2 wheeled) Gait Pattern/deviations: Step-to pattern;Decreased step length - left;Decreased stance time - left;Decreased weight shift to left;Narrow base of support;Trunk flexed Gait velocity: decr   General Gait Details: Mod assist to steady, physically support trunk, guide RW. Verbal cuing for sequencing gait, navigating room.   Stairs             Wheelchair Mobility    Modified Rankin (Stroke Patients Only)       Balance Overall balance assessment: Needs assistance Sitting-balance support: Feet supported;Single extremity supported Sitting balance-Leahy Scale: Fair   Postural control: Posterior lean Standing balance support: Bilateral upper extremity supported Standing balance-Leahy Scale: Poor Standing balance comment: reliant on external support, very high fall risk                            Cognition Arousal/Alertness: Awake/alert Behavior During Therapy: WFL for tasks assessed/performed Overall Cognitive Status: History of cognitive impairments - at baseline                                 General Comments:  history of dementia; follows commands well but at times performs mobility unsafely requiring physical assist and verbal cuing to correct.      Exercises General Exercises - Lower Extremity Long Arc Quad: AAROM;Left;10 reps;Seated    General Comments General comments (skin integrity, edema, etc.): redness noted along buttocks, RN applied foam dressing at end of session. Multiple  bruises along bilat UEs, skin tear L forearm with tegaderm dressing donned      Pertinent Vitals/Pain Pain Assessment: Faces Faces Pain Scale: Hurts even more Pain Location: L hip, ankle Pain Descriptors / Indicators: Discomfort;Grimacing;Sore;Guarding Pain Intervention(s): Limited activity within patient's tolerance;Monitored during session;Repositioned    Home Living                      Prior Function            PT Goals (current goals can now be found in the care plan section) Acute Rehab PT Goals Patient Stated Goal: To go home PT Goal Formulation: With patient Time For Goal Achievement: 08/28/20 Potential to Achieve Goals: Good Progress towards PT goals: Progressing toward goals    Frequency    Min 3X/week      PT Plan Current plan remains appropriate    Co-evaluation              AM-PAC PT "6 Clicks" Mobility   Outcome Measure  Help needed turning from your back to your side while in a flat bed without using bedrails?: A Lot Help needed moving from lying on your back to sitting on the side of a flat bed without using bedrails?: A Little Help needed moving to and from a bed to a chair (including a wheelchair)?: A Lot Help needed standing up from a chair using your arms (e.g., wheelchair or bedside chair)?: A Lot Help needed to walk in hospital room?: A Lot Help needed climbing 3-5 steps with a railing? : Total 6 Click Score: 12    End of Session Equipment Utilized During Treatment: Gait belt Activity Tolerance: Patient limited by pain;Patient limited by fatigue Patient left: with call bell/phone within reach;in chair;with chair alarm set;with nursing/sitter in room Nurse Communication: Mobility status PT Visit Diagnosis: Other abnormalities of gait and mobility (R26.89);Muscle weakness (generalized) (M62.81);Pain Pain - Right/Left: Left Pain - part of body: Hip;Knee     Time: 1022-1040 PT Time Calculation (min) (ACUTE ONLY): 18  min  Charges:  $Therapeutic Activity: 8-22 mins                    Stacie Glaze, PT DPT Acute Rehabilitation Services Pager [phone]  Office [phone]   Gaffney E Stroup 08/16/2020, 11:09 AM

## 2020-08-17 ENCOUNTER — Encounter (HOSPITAL_COMMUNITY): Payer: Self-pay | Admitting: Orthopaedic Surgery

## 2020-08-17 LAB — TYPE AND SCREEN
ABO/RH(D): A POS
Antibody Screen: NEGATIVE
Unit division: 0

## 2020-08-17 LAB — PROTIME-INR
INR: 2.9 — ABNORMAL HIGH (ref 0.8–1.2)
Prothrombin Time: 30.1 seconds — ABNORMAL HIGH (ref 11.4–15.2)

## 2020-08-17 LAB — BPAM RBC
Blood Product Expiration Date: 202206202359
Unit Type and Rh: 6200

## 2020-08-17 LAB — HEPARIN LEVEL (UNFRACTIONATED): Heparin Unfractionated: 0.21 IU/mL — ABNORMAL LOW (ref 0.30–0.70)

## 2020-08-17 MED ORDER — WARFARIN SODIUM 3 MG PO TABS
3.0000 mg | ORAL_TABLET | Freq: Once | ORAL | Status: AC
  Administered 2020-08-17: 3 mg via ORAL
  Filled 2020-08-17: qty 1

## 2020-08-17 NOTE — Anesthesia Postprocedure Evaluation (Addendum)
Anesthesia Post Note  Patient: Mary [name]  Procedure(s) Performed: ANTERIOR APPROACH HEMI HIP ARTHROPLASTY (Left Hip)     Patient location during evaluation: PACU Anesthesia Type: General Level of consciousness: awake and alert Pain management: pain level controlled Vital Signs Assessment: post-procedure vital signs reviewed and stable Respiratory status: spontaneous breathing, nonlabored ventilation and respiratory function stable Cardiovascular status: blood pressure returned to baseline and stable Postop Assessment: no apparent nausea or vomiting Anesthetic complications: no   No complications documented.  Last Vitals:  Vitals:   08/16/20 2039 08/17/20 0440  BP: (!) 108/46 110/61  Pulse: 86 100  Resp: 16 17  Temp: 37 C 36.8 C  SpO2: 96% 94%    Last Pain:  Vitals:   08/17/20 0604  TempSrc:   PainSc: Asleep   Pain Goal: Patients Stated Pain Goal: 2 (08/15/20 1812)                 Lidia Collum

## 2020-08-17 NOTE — Progress Notes (Signed)
PROGRESS NOTE    Mary [name]  [id number] DOB: [birth year] DOA: 08/11/2020 PCP: Bonnita Nasuti, MD   Chief Complaint  Patient presents with  . Fall  Brief Narrative: Patient is an 80 year old Caucasian female with past medical history significant for persistent A. fib, chronic diastolic CHF, recurrent pleural effusion, mechanical aortic valve in 1995 on Coumadin, HTN, HLD, OSA, psoriasis, orthostatic hypotension, history of CVA and T2DM, who lived independently at [facility].  Patient was admitted with a left hip fracture following a fall.  She was seen in the ED INR 2.2, creatinine 1.3 hemoglobin 11.8, x-ray showed left hip fracture.  [facility] was transferred to [facility] for cardiology evaluation and further management of the hip fracture.  Patient was seen by cardiology for preop evaluation.  Echocardiogram revealed no significant changes.  Patient was cleared and had surgery on 08/13/2020.  Patient was initially on heparin drip and Coumadin was restarted after the surgery.  INR today is 2.9.  Heparin has been discontinued.  Patient is stable for discharge to skilled nursing facility.  Subjective: -Pain is controlled. -No shortness of breath. -No chest pain  Assessment & Plan:  Left hip fracture/posterior malleolar fracture secondary to mechanical fall:  -Seen by orthopedic planning for operative intervention of the left hip and nonoperative management of the malleolar fracture after cardiology clearance.   -High risk for intubation/CVA so cardiology strongly encouraged regional anesthesia, -Echo done with no significant change from prior echo  -Patient underwent surgery on 08/13/2020.   -Postop management as per orthopedic team.   -Pain control has been optimized.   -Patient is stable for discharge to skilled nursing facility.  Mechanical AVR: -Surgery was done in 1995. -Patient has been on long-term anticoagulation with Coumadin. -Patient was started on heparin perioperatively  and Coumadin was discontinued. -Postop, Coumadin was reintroduced. -INR is 2.9 today. -Heparin has been discontinued.   Persistent A. Fib: -Patient is back to Coumadin. -Control heart rate.    History of combined systolic and diastolic CHF/mild pulmonary hypertension: -Stable.   T2DM: blood sugar well controlled A1c 5.1 in 2021 Recent Labs  Lab 08/11/20 1914 08/13/20 1045 08/13/20 1414  GLUCAP 99 97 100*   Chronic anemia downtrending monitor closely in the setting of Coumadin Heparin May need transfusion Recent Labs  Lab 08/13/20 1608 08/14/20 0220 08/14/20 2310 08/15/20 1455 08/16/20 0138  HGB 10.0* 8.7* 7.9* 7.6* 7.7*  HCT 31.7* 27.3* 24.2* 24.5* 24.4*   OSA noncompliant with CPAP  HLD continue simvastatin History of CVA-stable.  Anticoagulation as above Cirrhosis-stable CKD stage IIIb monitor renal function.  Renal function overall stable. Baseline creatinine as high as 2 in the past. Recent Labs    01/15/20 1325 01/16/20 0455 01/17/20 0600 02/11/20 1311 02/18/20 1830 08/11/20 1509 08/12/20 1902 08/13/20 1608 08/14/20 0220 08/14/20 2310 08/15/20 1455  BUN 45* 31* 23 26* 22 24* 30*  --  30* 38* 39*  CREATININE 2.01* 1.39* 1.16* 1.55* 1.26* 1.31* 1.82* 1.62* 1.53* 1.79* 1.71*   Moderate protein calorie malnutrition - augment diet as below dietitian on board Nutrition Problem: Moderate Malnutrition Etiology: social / environmental circumstances Signs/Symptoms: energy intake < or equal to 75% for > or equal to 1 month,mild fat depletion,mild muscle depletion Interventions: Ensure Enlive (each supplement provides 350kcal and 20 grams of protein),MVI   Diet Order            Diet Heart Room service appropriate? Yes; Fluid consistency: Thin  Diet effective now  Patient's Body mass index is 18.01 kg/m.  DVT prophylaxis: SCDs Start: 08/13/20 1541heparin gtt.  Coumadin started today, 08/14/2020. Code Status:   Code Status: Full Code   Family Communication: plan of care discussed with patient and her husband at bedside.  Status is: Inpatient  Remains inpatient appropriate because:IV treatments appropriate due to intensity of illness or inability to take PO and For management of hip fracture Dispo: The patient is from: Independent living facility at [place]              Anticipated d/c is to: SNF              Patient currently is not medically stable to d/c.   Difficult to place patient No Unresulted Labs (From admission, onward)          Start     Ordered   08/20/20 0500  Creatinine, serum  (BRIDGE to warfarin (includes Lovenox or Heparin -AND- warfarin pharmacy consult))  Weekly,   R     Comments: while on enoxaparin therapy.   Question:  Specimen collection method  Answer:  Lab=Lab collect   08/13/20 1540   08/15/20 1620  Protime-INR  Daily,   R     Question:  Specimen collection method  Answer:  Lab=Lab collect   08/15/20 1619   08/12/20 0500  CBC  Daily,   R      08/11/20 1708         Medications reviewed:  Scheduled Meds: . sodium chloride   Intravenous Once  . buPROPion  150 mg Oral Daily  . busPIRone  5 mg Oral BID  . docusate sodium  100 mg Oral BID  . feeding supplement  237 mL Oral BID BM  . lactulose  20 g Oral Daily  . multivitamin with minerals  1 tablet Oral Daily  . pantoprazole  40 mg Oral QAC breakfast  . torsemide  20 mg Oral Daily  . Warfarin - Pharmacist Dosing Inpatient   Does not apply q1600   Continuous Infusions: . sodium chloride 75 mL/hr at 08/16/20 2007  . lactated ringers 10 mL/hr at 08/13/20 1044  . methocarbamol (ROBAXIN) IV      Consultants:see note  Procedures:see note  Antimicrobials: Anti-infectives (From admission, onward)   Start     Dose/Rate Route Frequency Ordered Stop   08/14/20 0000  ceFAZolin (ANCEF) IVPB 2g/100 mL premix        2 g 200 mL/hr over 30 Minutes Intravenous Every 12 hours 08/13/20 1540 08/14/20 1613   08/13/20 1254  vancomycin  (VANCOCIN) powder  Status:  Discontinued          As needed 08/13/20 1254 08/13/20 1408   08/13/20 0600  ceFAZolin (ANCEF) IVPB 2g/100 mL premix        2 g 200 mL/hr over 30 Minutes Intravenous On call to O.R. 08/12/20 1831 08/13/20 1200     Culture/Microbiology    Component Value Date/Time   SDES PERITONEAL ABDOMEN 10/31/2019 1657   SDES PERITONEAL ABDOMEN 10/31/2019 1657   SPECREQUEST BOTTLES DRAWN AEROBIC AND ANAEROBIC 10/31/2019 1657   SPECREQUEST NONE 10/31/2019 1657   CULT  10/31/2019 1657    NO GROWTH 5 DAYS Performed at [facility], [place] [address], [place], [place] [address]    REPTSTATUS 11/05/2019 FINAL 10/31/2019 1657   REPTSTATUS 10/31/2019 FINAL 10/31/2019 1657    Other culture-see note  Objective: Vitals: Today's Vitals   08/17/20 0851 08/17/20 1445 08/17/20 1612 08/17/20 1646  BP:  Marland Kitchen)  115/56    Pulse:  93    Resp:  15    Temp:  97.8 F (36.6 C)    TempSrc:  Oral    SpO2:  98%    Weight:      Height:      PainSc: 6   10-Worst pain ever Asleep    Intake/Output Summary (Last 24 hours) at 08/17/2020 1752 Last data filed at 08/17/2020 1515 Gross per 24 hour  Intake 1761.27 ml  Output 400 ml  Net 1361.27 ml   Filed Weights   08/11/20 1126 08/13/20 1039  Weight: 52.2 kg 52.2 kg   Weight change:   Intake/Output from previous day: 05/30 0701 - 05/31 0700 In: 2568.1 [P.O.:360; I.V.:2208.1] Out: 950 [Urine:950] Intake/Output this shift: Total I/O In: 454.1 [I.V.:454.1] Out: 400 [Urine:400] Filed Weights   08/11/20 1126 08/13/20 1039  Weight: 52.2 kg 52.2 kg    Examination: General exam: AAOx 3, elderly lady, frail.  HEENT: Mild pallor. Respiratory system: Clear to auscultation.   Cardiovascular system: S1 & S2, systolic murmur at the apex.  Mechanical heart valve sounds heard at the thoracic area with systolic murmur.  Gastrointestinal system: Abdomen soft,NT,ND, BS+ Nervous System:Alert, awake, moving extremities, tender on  lft  hip Extremities: no edema.  Data Reviewed: I have personally reviewed following labs and imaging studies CBC: Recent Labs  Lab 08/13/20 1608 08/14/20 0220 08/14/20 2310 08/15/20 1455 08/16/20 0138  WBC 6.6 6.9 7.6 6.6 7.7  NEUTROABS  --   --  5.1 4.2  --   HGB 10.0* 8.7* 7.9* 7.6* 7.7*  HCT 31.7* 27.3* 24.2* 24.5* 24.4*  MCV 107.5* 107.1* 106.6* 110.9* 108.9*  PLT 146* 128* 134* 120* [id number]*   Basic Metabolic Panel: Recent Labs  Lab 08/11/20 1509 08/12/20 1902 08/13/20 1608 08/14/20 0220 08/14/20 2310 08/15/20 1455  NA 135 136  --  133* 133* 134*  K 3.8 4.4  --  4.4 4.3 4.8  CL 100 102  --  100 104 107  CO2 25 26  --  22 21* 21*  GLUCOSE 100* 125*  --  122* 140* 149*  BUN 24* 30*  --  30* 38* 39*  CREATININE 1.31* 1.82* 1.62* 1.53* 1.79* 1.71*  CALCIUM 9.7 8.7*  --  8.3* 8.0* 8.1*   GFR: Estimated Creatinine Clearance: 20.9 mL/min (A) (by C-G formula based on SCr of 1.71 mg/dL (H)). Liver Function Tests: Recent Labs  Lab 08/11/20 1509  AST 28  ALT 16  ALKPHOS 109  BILITOT 1.2  PROT 9.0*  ALBUMIN 4.0   No results for input(s): LIPASE, AMYLASE in the last 168 hours. No results for input(s): AMMONIA in the last 168 hours. Coagulation Profile: Recent Labs  Lab 08/14/20 0220 08/14/20 2310 08/15/20 1641 08/16/20 0138 08/17/20 0136  INR 1.2 1.6* 1.9* 2.1* 2.9*   Cardiac Enzymes: No results for input(s): CKTOTAL, CKMB, CKMBINDEX, TROPONINI in the last 168 hours. BNP (last 3 results) No results for input(s): PROBNP in the last 8760 hours. HbA1C: No results for input(s): HGBA1C in the last 72 hours. CBG: Recent Labs  Lab 08/11/20 1914 08/13/20 1045 08/13/20 1414  GLUCAP 99 97 100*   Lipid Profile: No results for input(s): CHOL, HDL, LDLCALC, TRIG, CHOLHDL, LDLDIRECT in the last 72 hours. Thyroid Function Tests: No results for input(s): TSH, T4TOTAL, FREET4, T3FREE, THYROIDAB in the last 72 hours. Anemia Panel: No results for input(s): VITAMINB12,  FOLATE, FERRITIN, TIBC, IRON, RETICCTPCT in the last 72 hours. Sepsis Labs: Recent Labs  Lab 08/11/20 1509  LATICACIDVEN 1.9    Recent Results (from the past 240 hour(s))  Resp Panel by RT-PCR (Flu A&B, Covid) Nasopharyngeal Swab     Status: None   Collection Time: 08/11/20 12:21 PM   Specimen: Nasopharyngeal Swab; Nasopharyngeal(NP) swabs in vial transport medium  Result Value Ref Range Status   SARS Coronavirus 2 by RT PCR NEGATIVE NEGATIVE Final    Comment: (NOTE) SARS-CoV-2 target nucleic acids are NOT DETECTED.  The SARS-CoV-2 RNA is generally detectable in upper respiratory specimens during the acute phase of infection. The lowest concentration of SARS-CoV-2 viral copies this assay can detect is 138 copies/mL. A negative result does not preclude SARS-Cov-2 infection and should not be used as the sole basis for treatment or other patient management decisions. A negative result may occur with  improper specimen collection/handling, submission of specimen other than nasopharyngeal swab, presence of viral mutation(s) within the areas targeted by this assay, and inadequate number of viral copies(<138 copies/mL). A negative result must be combined with clinical observations, patient history, and epidemiological information. The expected result is Negative.  Fact Sheet for Patients:  EntrepreneurPulse.com.au  Fact Sheet for Healthcare Providers:  IncredibleEmployment.be  This test is no t yet approved or cleared by the [place] FDA and  has been authorized for detection and/or diagnosis of SARS-CoV-2 by FDA under an Emergency Use Authorization (EUA). This EUA will remain  in effect (meaning this test can be used) for the duration of the COVID-19 declaration under Section 564(b)(1) of the Act, 21 U.S.C.section 360bbb-3(b)(1), unless the authorization is terminated  or revoked sooner.       Influenza A by PCR NEGATIVE NEGATIVE Final    Influenza B by PCR NEGATIVE NEGATIVE Final    Comment: (NOTE) The Xpert Xpress SARS-CoV-2/FLU/RSV plus assay is intended as an aid in the diagnosis of influenza from Nasopharyngeal swab specimens and should not be used as a sole basis for treatment. Nasal washings and aspirates are unacceptable for Xpert Xpress SARS-CoV-2/FLU/RSV testing.  Fact Sheet for Patients: EntrepreneurPulse.com.au  Fact Sheet for Healthcare Providers: IncredibleEmployment.be  This test is not yet approved or cleared by the [place] FDA and has been authorized for detection and/or diagnosis of SARS-CoV-2 by FDA under an Emergency Use Authorization (EUA). This EUA will remain in effect (meaning this test can be used) for the duration of the COVID-19 declaration under Section 564(b)(1) of the Act, 21 U.S.C. section 360bbb-3(b)(1), unless the authorization is terminated or revoked.  Performed at [facility], [address], [place], [place] [address]   Surgical pcr screen     Status: None   Collection Time: 08/12/20  1:00 AM   Specimen: Nasal Mucosa; Nasal Swab  Result Value Ref Range Status   MRSA, PCR NEGATIVE NEGATIVE Final   Staphylococcus aureus NEGATIVE NEGATIVE Final    Comment: (NOTE) The Xpert SA Assay (FDA approved for NASAL specimens in patients 89 years of age and older), is one component of a comprehensive surveillance program. It is not intended to diagnose infection nor to guide or monitor treatment. Performed at [facility], [place] [address] [place],  [address]      Radiology Studies: No results found.   LOS: 6 days   Bonnell Public, MD [facility]  08/17/2020, 5:52 PM

## 2020-08-17 NOTE — Plan of Care (Signed)
  Problem: Clinical Measurements: Goal: Ability to maintain clinical measurements within normal limits will improve Outcome: Progressing Goal: Will remain free from infection Outcome: Progressing   

## 2020-08-17 NOTE — TOC Initial Note (Signed)
Transition of Care [facility]) - Initial/Assessment Note    Patient Details  Name: Mary [name] MRN: [id number] Date of Birth: 1941-02-23  [facility]) CM/SW Contact:    Sharin Mons, RN Phone Number: 08/17/2020, 6:16 PM  Clinical Narrative:      s/p hemiarthroplasty of L hip on 5/27             RNCM received consult for possible SNF placement at time of discharge. From [place] with husband. RNCM spoke with patient regarding PT recommendation of SNF placement at time of discharge. Patient reported that patient's spouse  Kela Millin  is currently unable to care for patient at their home given patient's current physical needs and fall risk. Patient expressed understanding of PT recommendation and is agreeable to SNF placement at time of discharge. Patient reports preference for [facility]  . RNCM discussed insurance authorization process and provided Medicare SNF ratings list. Patient expressed being hopeful for rehab and to feel better soon. No further questions reported at this time. RNCM to continue to follow and assist with discharge planning needs. Pt fully vaccinated and boosted. SNF referral faxed out.  Expected Discharge Plan: [place]     Patient Goals and CMS Choice Patient states their goals for this hospitalization and ongoing recovery are:: to get better   Choice offered to / list presented to : [facility]  Expected Discharge Plan and Services Expected Discharge Plan: [place]                                              Prior Living Arrangements/Services                       Activities of Daily Living Home Assistive Devices/Equipment: CPAP,Walker (specify type),Shower chair with back,Bedside commode/3-in-1 (four wheel walker) ADL Screening (condition at time of admission) Patient's cognitive ability adequate to safely complete daily activities?: Yes Is the patient deaf or have difficulty  hearing?: No Does the patient have difficulty seeing, even when wearing glasses/contacts?: No Does the patient have difficulty concentrating, remembering, or making decisions?: No Patient able to express need for assistance with ADLs?: Yes Does the patient have difficulty dressing or bathing?: No Independently performs ADLs?: Yes (appropriate for developmental age) Does the patient have difficulty walking or climbing stairs?: No Weakness of Legs: None Weakness of Arms/Hands: None  Permission Sought/Granted                  Emotional Assessment              Admission diagnosis:  Hip fracture ([place]) [S72.009A] Fall [W19.XXXA] Closed fracture of neck of left femur, initial encounter ([place]) [S72.002A] Patient Active Problem List   Diagnosis Date Noted  . Malnutrition of moderate degree 08/13/2020  . Closed displaced fracture of left femoral neck ([place]) 08/13/2020  . Hip fracture ([place]) 08/11/2020  . Agitation 02/21/2020  . Confusion 02/21/2020  . Hyperkalemia 01/15/2020  . 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 Anticoagulant long-term use 01/15/2020  . Other secondary pulmonary hypertension ([place]) 10/28/2019  . Acute on chronic combined systolic and diastolic CHF (congestive heart failure) ([place])   . Anasarca 04/25/2019  . Hypoalbuminemia 04/25/2019  . Persistent atrial fibrillation ([place]) 04/25/2019  . CKD (chronic kidney disease), stage III ([place]) 04/25/2019  . Anemia  due to chronic kidney disease 04/25/2019  . Pressure injury of skin 04/24/2019  . Fall 04/23/2019  . S/P right knee arthroscopy 04/13/2015  . OSA (obstructive sleep apnea) 01/01/2013  . Diabetes ([place]) 10/23/2012  . S/P aortic valve replacement 06/30/2010  . Hyperlipidemia 09/29/2008  . Obesity 09/29/2008  . Essential hypertension 09/29/2008  . DEGENERATIVE JOINT DISEASE 09/29/2008   PCP:  Bonnita Nasuti, MD Pharmacy:   [facility] [address], [place] [address] [place] [address] Phone: [phone] Fax: [phone]     Social Determinants of Health (SDOH) Interventions    Readmission Risk Interventions No flowsheet data found.

## 2020-08-17 NOTE — Progress Notes (Signed)
Occupational Therapy Treatment Patient Details Name: Mary [name] MRN: [id number] DOB: Apr 03, 1940 Today's Date: 08/17/2020    History of present illness The pt is an 80 yo female presenting from ALF on 5/25 after a fall on her L side that occured when she tripped over the cord while vacuuming. Imaging revealed L hip fx, and pt is now s/p hemiarthroplasty of L hip on 5/27. PMHx significant for Dementia, CHF, A-fib, DM II with neuropathy, Hx aortic valve replacement, HTN, HLD, OA, and Hx of CVA.   OT comments  Mary [name] is progressing slowly, she reports 10/10 pain at the start, and her husband present for the session. Mary [name] required mod A for all bed mobility this session and significantly increased time for pain management. Once sitting EOB Mary [name] required min/mod A for keeping her trunk balanced at midline, she had sever R lateral lean this session. Mary [name] continued to report the need to urinate, and was distracted by multiple providers in her room and conversations with her husband which limited the session to bed mobility only for safety reasons. Pt husband reported concerns with her medication, RN notified; and they verbalized that they are okay with a short SNF stay because her ILF does not have needed rehab services. Continue OT acutely to progress function in ADLs and mobility. D/c plan updated.     Follow Up Recommendations  SNF;Supervision/Assistance - 24 hour (Pt's husband reports that [facility] ILF does not have rehab services, and they are agreeable to SNF placement)    Equipment Recommendations  3 in 1 bedside commode       Precautions / Restrictions Precautions Precautions: Fall Precaution Booklet Issued: No Precaution Comments: per op note, no hip precautions Required Braces or Orthoses: Splint/Cast Splint/Cast: Cam walker on LLE Restrictions Weight Bearing Restrictions: Yes LLE Weight Bearing: Weight bearing as tolerated Other Position/Activity Restrictions: with CAM walker boot        Mobility Bed Mobility Overal bed mobility: Needs Assistance Bed Mobility: Supine to Sit;Sit to Supine     Supine to sit: Max assist Sit to supine: Max assist   General bed mobility comments: Pt required mod assist for LLE management and trunk elevation; while sitting EOB pt required min/mod A to balance trunk at midline    Transfers      Balance Overall balance assessment: Needs assistance Sitting-balance support: Feet supported;Single extremity supported Sitting balance-Mary [name] Scale: Poor Sitting balance - Comments: Pt required min/mod A to balance trunk at midline, pt with sever right lateral lean          ADL either performed or assessed with clinical judgement   ADL Overall ADL's : Needs assistance/impaired         Functional mobility during ADLs:  (limited to bed mobility this session due to pain and providers in/out of pts room) General ADL Comments: Pt reported having the urge to pee however was unable to urinate this session     Vision   Vision Assessment?: No apparent visual deficits   Perception     Praxis      Cognition Arousal/Alertness: Awake/alert Behavior During Therapy: WFL for tasks assessed/performed Overall Cognitive Status: History of cognitive impairments - at baseline         General Comments: history of dementia; follows commands well but at times performs mobility unsafely requiring physical assist and verbal cuing to correct.              General Comments purwick removed and soiled, therapist removes, RN notified; pt's husband  present this session, HOH    Pertinent Vitals/ Pain       Pain Assessment: 0-10 Pain Score: 10-Worst pain ever Pain Location: L hip, ankle Pain Descriptors / Indicators: Discomfort;Grimacing;Sore;Guarding Pain Intervention(s): Limited activity within patient's tolerance;Monitored during session         Frequency  Min 2X/week        Progress Toward Goals  OT Goals(current goals can now be  found in the care plan section)  Progress towards OT goals: Not progressing toward goals - comment (pt limited by pain this session)  Acute Rehab OT Goals Patient Stated Goal: To go home OT Goal Formulation: With patient/family Time For Goal Achievement: 08/28/20 Potential to Achieve Goals: Good ADL Goals Pt Will Perform Lower Body Dressing: with adaptive equipment;with modified independence;sit to/from stand;sitting/lateral leans Pt Will Transfer to Toilet: with modified independence;ambulating;bedside commode Pt Will Perform Toileting - Clothing Manipulation and hygiene: with modified independence;sitting/lateral leans;sit to/from stand Additional ADL Goal #1: Pt will recall hip precautions with 1 verbal cue. Additional ADL Goal #2: Pt will verbalize 3 fall prevention techniques with min verbal cueing.  Plan Discharge plan needs to be updated       AM-PAC OT "6 Clicks" Daily Activity     Outcome Measure   Help from another person eating meals?: None Help from another person taking care of personal grooming?: A Little Help from another person toileting, which includes using toliet, bedpan, or urinal?: A Lot   Help from another person to put on and taking off regular upper body clothing?: A Little Help from another person to put on and taking off regular lower body clothing?: A Lot 6 Click Score: 14    End of Session Equipment Utilized During Treatment: Gait belt  OT Visit Diagnosis: Unsteadiness on feet (R26.81);Other abnormalities of gait and mobility (R26.89);Muscle weakness (generalized) (M62.81)   Activity Tolerance Patient limited by pain   Patient Left in bed;with call bell/phone within reach;with bed alarm set;with family/visitor present   Nurse Communication Mobility status;Other (comment) (purwick status)        Time: [id number] OT Time Calculation (min): 32 min  Charges: OT General Charges $OT Visit: 1 Visit OT Treatments $Self Care/Home Management : 23-37  mins    Erhardt Dada A Sneha Willig 08/17/2020, 10:41 AM

## 2020-08-17 NOTE — Progress Notes (Signed)
[place] for Coumadin and Heparin Indication: Mechanical heart valve, AFib   Labs: Recent Labs    08/14/20 2310 08/15/20 0628 08/15/20 1455 08/15/20 1641 08/16/20 0138 08/16/20 1015 08/17/20 0136  HGB 7.9*  --  7.6*  --  7.7*  --   --   HCT 24.2*  --  24.5*  --  24.4*  --   --   PLT 134*  --  120*  --  130*  --   --   LABPROT 18.9*  --   --  21.9* 23.7*  --  30.1*  INR 1.6*  --   --  1.9* 2.1*  --  2.9*  HEPARINUNFRC 0.33   < >  --  0.21* 0.26* 0.33 0.21*  CREATININE 1.79*  --  1.71*  --   --   --   --    < > = values in this interval not displayed.    Assessment: 44 yoF admitted from independent living facility s/p mechanical fall. On warfarin PTA for mechanical AVR and AFib. INR reversed with vitamin K 10 mg IV on 5/26. 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 Pharmacy consulted to resume warfarin and resume heparin bridge 24 hours post-op.   Heparin level slightly below goal.  Unsure why there are several charting of heparin being stopped and restarted.  INR now therapeutic at 2.9.  No bleeding reported.  Goal of Therapy:  INR goal 2.5-3.5 Heparin level goal 0.5-0.7 units/mL Monitor platelets by anticoagulation protocol: Yes   Plan:  D/C heparin gtt per discussion with MD Coumadin '3mg'$  PO today Daily PT / INR  Jalen Daluz D. Mina Marble, PharmD, BCPS, [place] 08/17/2020, 8:02 AM

## 2020-08-17 NOTE — Plan of Care (Signed)
  Problem: Education: Goal: Knowledge of General Education information will improve Description: Including pain rating scale, medication(s)/side effects and non-pharmacologic comfort measures Outcome: Progressing   Problem: Health Behavior/Discharge Planning: Goal: Ability to manage health-related needs will improve Outcome: Progressing   Problem: Clinical Measurements: Goal: Ability to maintain clinical measurements within normal limits will improve Outcome: Progressing   Problem: Activity: Goal: Risk for activity intolerance will decrease Outcome: Progressing   Problem: Nutrition: Goal: Adequate nutrition will be maintained Outcome: Progressing   Problem: Pain Managment: Goal: General experience of comfort will improve Outcome: Progressing   Problem: Safety: Goal: Ability to remain free from injury will improve Outcome: Progressing   

## 2020-08-17 NOTE — NC FL2 (Addendum)
[place] MEDICAID FL2 LEVEL OF CARE SCREENING TOOL     IDENTIFICATION  Patient Name: Mary [name] Birthdate: March 28, 1940 Sex: female Admission Date (Current Location): 08/11/2020  [facility] and [place] Number:  Herbalist and Address:  [facility], [place] [address], [place], [place] [address]      Provider Number: [id number]  Attending Physician Name and Address:  Bonnell Public, MD  Relative Name and Phone Number:       Current Level of Care: Hospital Recommended Level of Care: [place] Prior Approval Number:    Date Approved/Denied:   PASRR Number: [id number]  Discharge Plan: SNF    Current Diagnoses: Patient Active Problem List   Diagnosis Date Noted  . Malnutrition of moderate degree 08/13/2020  . Closed displaced fracture of left femoral neck ([place]) 08/13/2020  . Hip fracture ([place]) 08/11/2020  . Agitation 02/21/2020  . Confusion 02/21/2020  . Hyperkalemia 01/15/2020  . 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 Anticoagulant long-term use 01/15/2020  . Other secondary pulmonary hypertension ([place]) 10/28/2019  . Acute on chronic combined systolic and diastolic CHF (congestive heart failure) ([place])   . Anasarca 04/25/2019  . Hypoalbuminemia 04/25/2019  . Persistent atrial fibrillation ([place]) 04/25/2019  . CKD (chronic kidney disease), stage III ([place]) 04/25/2019  . Anemia due to chronic kidney disease 04/25/2019  . Pressure injury of skin 04/24/2019  . Fall 04/23/2019  . S/P right knee arthroscopy 04/13/2015  . OSA (obstructive sleep apnea) 01/01/2013  . Diabetes ([place]) 10/23/2012  . S/P aortic valve replacement 06/30/2010  . Hyperlipidemia 09/29/2008  . Obesity 09/29/2008  . Essential hypertension 09/29/2008  . DEGENERATIVE JOINT DISEASE 09/29/2008    Orientation RESPIRATION BLADDER Height & Weight     Self,Time,Situation,Place (forgetful)  Normal Continent Weight: 52.2 kg Height:  '5\' 7"'$  (170.2 cm)   BEHAVIORAL SYMPTOMS/MOOD NEUROLOGICAL BOWEL NUTRITION STATUS      Continent Diet (refer to d/c summary)  AMBULATORY STATUS COMMUNICATION OF NEEDS Skin   Extensive Assist Verbally Surgical wounds (s/p hemiarthroplasty of L hip on 5/27)                       Personal Care Assistance Level of Assistance  Bathing,Feeding,Dressing Bathing Assistance: Maximum assistance Feeding assistance: Independent Dressing Assistance: Maximum assistance     Functional Limitations Info  Sight,Hearing,Speech Sight Info: Adequate Hearing Info: Adequate Speech Info: Adequate    SPECIAL CARE FACTORS FREQUENCY  PT (By licensed PT),OT (By licensed OT)     PT Frequency: 5x/week evalute and treat OT Frequency: 5x/week evalute and treat            Contractures Contractures Info: Not present    Additional Factors Info  Allergies,Code Status Code Status Info: Full Code Allergies Info: Tape           Current Medications (08/17/2020):  This is the current hospital active medication list Current Facility-Administered Medications  Medication Dose Route Frequency Provider Last Rate Last Admin  . 0.9 %  sodium chloride infusion (Manually program via Guardrails IV Fluids)   Intravenous Once Leandrew Koyanagi, MD      . 0.9 %  sodium chloride infusion   Intravenous Continuous Leandrew Koyanagi, MD 75 mL/hr at 08/16/20 2007 New Bag at 08/16/20 2007  . acetaminophen (TYLENOL) tablet 325-650 mg  325-650 mg Oral Q6H PRN Leandrew Koyanagi, MD   650 mg at 08/16/20 1329  . alum & mag  hydroxide-simeth (MAALOX/MYLANTA) 200-200-20 MG/5ML suspension 30 mL  30 mL Oral Q4H PRN Leandrew Koyanagi, MD      . bisacodyl (DULCOLAX) EC tablet 5 mg  5 mg Oral Daily PRN Leandrew Koyanagi, MD      . buPROPion [facility]) 12 hr tablet 150 mg  150 mg Oral Daily Leandrew Koyanagi, MD   150 mg at 08/17/20 0849  . busPIRone (BUSPAR) tablet 5 mg  5 mg Oral BID Leandrew Koyanagi, MD   5 mg at 08/17/20 0849  . docusate sodium (COLACE) capsule 100 mg   100 mg Oral BID Leandrew Koyanagi, MD   100 mg at 08/17/20 0849  . feeding supplement (ENSURE ENLIVE / ENSURE PLUS) liquid 237 mL  237 mL Oral BID BM Leandrew Koyanagi, MD   237 mL at 08/17/20 0851  . HYDROmorphone (DILAUDID) injection 0.5-1 mg  0.5-1 mg Intravenous Q4H PRN Leandrew Koyanagi, MD   0.5 mg at 08/13/20 2155  . lactated ringers infusion   Intravenous Continuous Leandrew Koyanagi, MD 10 mL/hr at 08/13/20 1044 New Bag at 08/13/20 1044  . lactulose (CHRONULAC) 10 GM/15ML solution 20 g  20 g Oral Daily Leandrew Koyanagi, MD   20 g at 08/17/20 0850  . magnesium citrate solution 1 Bottle  1 Bottle Oral Once PRN Leandrew Koyanagi, MD      . menthol-cetylpyridinium (CEPACOL) lozenge 3 mg  1 lozenge Oral PRN Leandrew Koyanagi, MD       Or  . phenol (CHLORASEPTIC) mouth spray 1 spray  1 spray Mouth/Throat PRN Leandrew Koyanagi, MD      . methocarbamol (ROBAXIN) tablet 500 mg  500 mg Oral Q6H PRN Leandrew Koyanagi, MD   500 mg at 08/17/20 [id number]   Or  . methocarbamol (ROBAXIN) 500 mg in dextrose 5 % 50 mL IVPB  500 mg Intravenous Q6H PRN Leandrew Koyanagi, MD      . multivitamin with minerals tablet 1 tablet  1 tablet Oral Daily Leandrew Koyanagi, MD   1 tablet at 08/17/20 0849  . ondansetron (ZOFRAN) tablet 4 mg  4 mg Oral Q6H PRN Leandrew Koyanagi, MD       Or  . ondansetron [facility]) injection 4 mg  4 mg Intravenous Q6H PRN Leandrew Koyanagi, MD      . oxyCODONE (Oxy IR/ROXICODONE) immediate release tablet 10-15 mg  10-15 mg Oral Q4H PRN Leandrew Koyanagi, MD   10 mg at 08/17/20 1612  . oxyCODONE (Oxy IR/ROXICODONE) immediate release tablet 5-10 mg  5-10 mg Oral Q4H PRN Leandrew Koyanagi, MD   10 mg at 08/17/20 0519  . pantoprazole (PROTONIX) EC tablet 40 mg  40 mg Oral QAC breakfast Leandrew Koyanagi, MD   40 mg at 08/17/20 0849  . polyethylene glycol (MIRALAX / GLYCOLAX) packet 17 g  17 g Oral Daily PRN Leandrew Koyanagi, MD      . sorbitol 70 % solution 30 mL  30 mL Oral Daily PRN Leandrew Koyanagi, MD      . torsemide [facility]) tablet 20 mg  20 mg Oral Daily  Leandrew Koyanagi, MD   20 mg at 08/17/20 0849  . Warfarin - Pharmacist Dosing Inpatient   Does not apply [place], [place], [facility]         Discharge Medications: Please see discharge summary for a list of discharge medications.  Relevant Imaging Results:  Relevant Lab Results:  Additional Information SSN: [id number]  Sharin Mons, RN

## 2020-08-18 DIAGNOSIS — I10 Essential (primary) hypertension: Secondary | ICD-10-CM

## 2020-08-18 DIAGNOSIS — N183 Chronic kidney disease, stage 3 unspecified: Secondary | ICD-10-CM

## 2020-08-18 LAB — BASIC METABOLIC PANEL
Anion gap: 7 (ref 5–15)
BUN: 37 mg/dL — ABNORMAL HIGH (ref 8–23)
CO2: 22 mmol/L (ref 22–32)
Calcium: 8.7 mg/dL — ABNORMAL LOW (ref 8.9–10.3)
Chloride: 103 mmol/L (ref 98–111)
Creatinine, Ser: 1.46 mg/dL — ABNORMAL HIGH (ref 0.44–1.00)
GFR, Estimated: 36 mL/min — ABNORMAL LOW (ref >60)
Glucose, Bld: 179 mg/dL — ABNORMAL HIGH (ref 70–99)
Potassium: 4.9 mmol/L (ref 3.5–5.1)
Sodium: 132 mmol/L — ABNORMAL LOW (ref 135–145)

## 2020-08-18 LAB — CBC
HCT: 24.2 % — ABNORMAL LOW (ref 36.0–46.0)
Hemoglobin: 7.6 g/dL — ABNORMAL LOW (ref 12.0–15.0)
MCH: 33.9 pg (ref 26.0–34.0)
MCHC: 31.4 g/dL (ref 30.0–36.0)
MCV: 108 fL — ABNORMAL HIGH (ref 80.0–100.0)
Platelets: 189 10*3/uL (ref 150–400)
RBC: 2.24 MIL/uL — ABNORMAL LOW (ref 3.87–5.11)
RDW: 15.5 % (ref 11.5–15.5)
WBC: 5.2 10*3/uL (ref 4.0–10.5)
nRBC: 0 % (ref 0.0–0.2)

## 2020-08-18 LAB — VITAMIN B12: Vitamin B-12: 1047 pg/mL — ABNORMAL HIGH (ref 180–914)

## 2020-08-18 LAB — PROTIME-INR
INR: 3.2 — ABNORMAL HIGH (ref 0.8–1.2)
Prothrombin Time: 32.4 seconds — ABNORMAL HIGH (ref 11.4–15.2)

## 2020-08-18 LAB — FOLATE: Folate: 15.9 ng/mL

## 2020-08-18 MED ORDER — WARFARIN SODIUM 3 MG PO TABS
3.0000 mg | ORAL_TABLET | Freq: Once | ORAL | Status: AC
  Administered 2020-08-18: 3 mg via ORAL
  Filled 2020-08-18: qty 1

## 2020-08-18 NOTE — TOC Progression Note (Signed)
Transition of Care [facility]) - Progression Note    Patient Details  Name: Mary [name] MRN: [id number] Date of Birth: 11-15-1940  Transition of Care [facility]) CM/SW Contact  Sharin Mons, RN Phone Number: 08/18/2020, 10:16 AM  Clinical Narrative:    Pt accepted SNF bed [facility] from [facility]. Insurance authorization pending.[facility]NF initiating authorization.  TOC team will continue to follow and assist with needs.   Expected Discharge Plan: Skilled Nursing Facility Barriers to Discharge: Insurance Authorization  Expected Discharge Plan and Services Expected Discharge Plan: [place]                                               Social Determinants of Health (SDOH) Interventions    Readmission Risk Interventions No flowsheet data found.

## 2020-08-18 NOTE — Progress Notes (Signed)
Nutrition Follow-up  DOCUMENTATION CODES:   Non-severe (moderate) malnutrition in context of social or environmental circumstances,Underweight  INTERVENTION:   -Continue Ensure Enlive po BID, each supplement provides 350 kcal and 20 grams of protein -Continue MVI with minerals daily  NUTRITION DIAGNOSIS:   Moderate Malnutrition related to social / environmental circumstances as evidenced by energy intake < or equal to 75% for > or equal to 1 month,mild fat depletion,mild muscle depletion.  Ongoing  GOAL:   Patient will meet greater than or equal to 90% of their needs  Progressing   MONITOR:   Supplement acceptance,PO intake,Labs,Weight trends,Skin,I & O's  REASON FOR ASSESSMENT:   Consult Assessment of nutrition requirement/status,Hip fracture protocol  ASSESSMENT:   Mary [name]  is a 80 y.o. female, with a history of persistent atrial fibrillation, chronic diastolic CHF, recurrent pleural effusions, mechanical aortic valve replacement 1995 on anticoagulation, HTN, HLD, OSA, cirrhosis, orthostatic hypotension, history of CVA, DM type II.  5/27- s/p Prosthetic replacement for femoral neck fracture  Reviewed I/O's: +54 ml x 24 hours and +6.1 L since admission  UOP: 400 ml x 24 hours  Pt sleeping soundly at time of visit. RD did not disturb.   Pt with fair appetite. Noted meal completion 50%. Pt is consuming Ensure Enlive supplements.   Medications reviewed and include colace, lactulose, and demadex.   Per TOC notes, plan to d/c to SNF once medically stable.   Labs reviewed.   Diet Order:   Diet Order            Diet Heart Room service appropriate? Yes; Fluid consistency: Thin  Diet effective now                 EDUCATION NEEDS:   Education needs have been addressed  Skin:  Skin Assessment: Skin Integrity Issues: Skin Integrity Issues:: Incisions Incisions: closed lt hip  Last BM:  08/15/20  Height:   Ht Readings from Last 1 Encounters:  08/13/20  '5\' 7"'$  (1.702 m)    Weight:   Wt Readings from Last 1 Encounters:  08/13/20 52.2 kg    Ideal Body Weight:  61.4 kg  BMI:  Body mass index is 18.01 kg/m.  Estimated Nutritional Needs:   Kcal:  1550-1750  Protein:  80-95 grams  Fluid:  > 1.5 L    Loistine Chance, RD, LDN, [place] Registered Dietitian II Certified Diabetes Care and Education Specialist Please refer to [facility] for RD and/or RD on-call/weekend/after hours pager

## 2020-08-18 NOTE — Plan of Care (Signed)
  Problem: Education: Goal: Knowledge of General Education information will improve Description: Including pain rating scale, medication(s)/side effects and non-pharmacologic comfort measures Outcome: Progressing   Problem: Health Behavior/Discharge Planning: Goal: Ability to manage health-related needs will improve Outcome: Progressing   Problem: Clinical Measurements: Goal: Ability to maintain clinical measurements within normal limits will improve Outcome: Progressing   Problem: Pain Managment: Goal: General experience of comfort will improve Outcome: Progressing   Problem: Safety: Goal: Ability to remain free from injury will improve Outcome: Progressing   Problem: Activity: Goal: Risk for activity intolerance will decrease Outcome: Not Progressing   Problem: Nutrition: Goal: Adequate nutrition will be maintained Outcome: Not Progressing

## 2020-08-18 NOTE — Progress Notes (Signed)
Physical Therapy Treatment Patient Details Name: Mary [name] MRN: [id number] DOB: [birth year] Today's Date: 08/18/2020    History of Present Illness The pt is an 80 yo female presenting from ALF on 5/25 after a fall on her L side that occured when she tripped over the cord while vacuuming. Imaging revealed L hip fx, and pt is now s/p hemiarthroplasty of L hip on 5/27. PMHx significant for Dementia, CHF, A-fib, DM II with neuropathy, Hx aortic valve replacement, HTN, HLD, OA, and Hx of CVA.    PT Comments    Pt anxious about mobility, states her L hip, shoulder, and back hurt, but is agreeable. Pt requiring increased assist for mobility today, a heavy max assist for standing and pivot to recliner. Pt is somewhat resistant to mobility, especially when initiating standing. Pt recommending ST-SNF to address deficits.    Follow Up Recommendations  Supervision/Assistance - 24 hour;SNF     Equipment Recommendations  None recommended by PT    Recommendations for Other Services       Precautions / Restrictions Precautions Precautions: Fall Precaution Booklet Issued: No Precaution Comments: per op note, no hip precautions Required Braces or Orthoses: Splint/Cast Splint/Cast: Cam walker on LLE Restrictions LLE Weight Bearing: Weight bearing as tolerated Other Position/Activity Restrictions: with CAM walker boot - maintain cam walker boot all times    Mobility  Bed Mobility Overal bed mobility: Needs Assistance Bed Mobility: Supine to Sit     Supine to sit: Max assist     General bed mobility comments: max assist for trunk and LE management, scooting to EOB. Very increased time    Transfers Overall transfer level: Needs assistance Equipment used: Rolling walker (2 wheeled);1 person hand held assist Transfers: Sit to/from [employer] Sit to Stand: Max assist;From elevated surface   Squat pivot transfers: Max assist     General transfer comment: Max assist for  power up, rise, bodyweight support, and steadying upon standing. STS x2 with use of RW, pt unable to take steps or weight shift once standing with heavy posterior bias. Squat pivot to recliner with max assist towards L.  Ambulation/Gait                 Stairs             Wheelchair Mobility    Modified Rankin (Stroke Patients Only)       Balance Overall balance assessment: Needs assistance Sitting-balance support: Feet supported;Single extremity supported Sitting balance-Leahy Scale: Poor Sitting balance - Comments: min-mod posterior assist to correct balance Postural control: Posterior lean;Right lateral lean Standing balance support: Bilateral upper extremity supported Standing balance-Leahy Scale: Poor                              Cognition Arousal/Alertness: Awake/alert Behavior During Therapy: WFL for tasks assessed/performed Overall Cognitive Status: History of cognitive impairments - at baseline                                 General Comments: history of dementia; self-limiting and requires repeated cues at least partially due to anxiety about mobility      Exercises General Exercises - Lower Extremity Long Arc Quad: AAROM;Left;10 reps;Seated (tactile tapping and verbal cues for quad activation)    General Comments        Pertinent Vitals/Pain Pain Assessment: Faces Faces Pain Scale: Hurts whole  lot Pain Location: L hip, ankle Pain Descriptors / Indicators: Discomfort;Grimacing;Sore;Guarding Pain Intervention(s): Limited activity within patient's tolerance;Monitored during session;Repositioned    Home Living                      Prior Function            PT Goals (current goals can now be found in the care plan section) Acute Rehab PT Goals Patient Stated Goal: get better PT Goal Formulation: With patient Time For Goal Achievement: 08/28/20 Potential to Achieve Goals: Good Progress towards PT goals:  Progressing toward goals    Frequency    Min 3X/week      PT Plan Discharge plan needs to be updated    Co-evaluation              AM-PAC PT "6 Clicks" Mobility   Outcome Measure  Help needed turning from your back to your side while in a flat bed without using bedrails?: A Lot Help needed moving from lying on your back to sitting on the side of a flat bed without using bedrails?: A Lot Help needed moving to and from a bed to a chair (including a wheelchair)?: A Lot Help needed standing up from a chair using your arms (e.g., wheelchair or bedside chair)?: A Lot Help needed to walk in hospital room?: Total Help needed climbing 3-5 steps with a railing? : Total 6 Click Score: 10    End of Session Equipment Utilized During Treatment: Gait belt Activity Tolerance: Patient limited by pain;Patient limited by fatigue Patient left: with call bell/phone within reach;in chair;with chair alarm set Nurse Communication: Mobility status PT Visit Diagnosis: Other abnormalities of gait and mobility (R26.89);Muscle weakness (generalized) (M62.81);Pain Pain - Right/Left: Left Pain - part of body: Hip;Knee     Time: [id number] PT Time Calculation (min) (ACUTE ONLY): 22 min  Charges:  $Therapeutic Activity: 8-22 mins                     Stacie Glaze, PT DPT Acute Rehabilitation Services Pager [phone]  Office [phone]   Roxine Caddy E Ruffin Pyo 08/18/2020, 6:04 PM

## 2020-08-18 NOTE — Progress Notes (Signed)
[facility]                                                                              Patient Demographics  Mary [name], is a 80 y.o. female, DOB - 1940-03-27, [id number]  Admit date - 08/11/2020   Admitting Physician Albertine Patricia, MD  Outpatient Primary MD for the patient is Hague, Rosalyn Charters, MD  Outpatient specialists:   LOS - 7  days   Medical records reviewed and are as summarized below:    Chief Complaint  Patient presents with  . Fall       Brief summary   Patient is a 80 year old female with persistent A. fib, chronic diastolic CHF, recurrent pleural effusion, mechanical aortic valve in 1995 on Coumadin, hypertension, hyperlipidemia, OSA psoriasis, orthostatic hypotension, prior history of CVA, T2DM, lives independently at [facility].  Patient tripped over a cord and fell hurting her entire left side and had intense left hip pain, left arm and left knee pain.  Patient was at independent living facility [facility].  Patient could not ambulate after the fall. In ED, INR 2.2, creatinine 1.3 at baseline, hemoglobin 11.8.  Imaging showed left posterior malleolus ankle fracture, left hip fracture  Underwent left hip hemiarthroplasty on 08/13/2020 (Dr Erlinda Hong), awaiting skilled nursing facility  Assessment & Plan    Principal Problem:   Closed displaced fracture of left femoral neck ([place]) -Status post a left hip hemiarthroplasty on 08/13/20 -No acute issues, pain controlled, awaiting skilled nursing facility  Active Problems: Left posterior malleolus ankle fracture -Per orthopedics she will remain in cam brace to support the left ankle injury and prevent heel cord contracture -Continue PT OT, outpatient follow-up  Persistent atrial fibrillation, History of mechanical aortic valve, on Coumadin -Heart rate controlled -Continue Coumadin per pharmacy, INR 3.2  Acute blood loss anemia on chronic anemia, macrocytic -Hemoglobin 11.8 at the time of  admission, trended down to 7.7 on 5/30 - will recheck CBC, if patient needs transfusion -Follow B12, folate    Hyperlipidemia -Continue statin  History of combined systolic and diastolic CHF, mild pulmonary hypertension -Currently euvolemic, no acute issues -Follow I's and O's closely, continue torsemide 20 mg daily     Essential hypertension -BP stable  Mild acute on CKD (chronic kidney disease), stage III (HCC) -Baseline creatinine ~1.5, as high as 2.0 in the past year -Creatinine at the time of admission 1.8 -Improving, closer to baseline 1.7     Malnutrition of moderate degree Estimated body mass index is 18.01 kg/m as calculated from the following:   Height as of this encounter: '5\' 7"'$  (1.702 m).   Weight as of this encounter: 52.2 kg.  -Dietitian consult, continue nutritional supplements  Code Status: Full CODE STATUS DVT Prophylaxis:  SCDs Start: 08/13/20 1541 warfarin (COUMADIN) tablet 3 mg   Level of Care: Level of care: Telemetry Medical Family Communication: Discussed all imaging results, lab results, explained to the patient and husband at the bedside   Disposition Plan:     Status is: Inpatient  Remains inpatient appropriate because:Inpatient level of care appropriate due to severity of illness   Dispo: The patient is from:  Home              Anticipated d/c is to: SNF              Patient currently is medically stable to d/c.   Difficult to place patient No      Time Spent in minutes 25 mins   Procedures:  Left hip hemiarthroplasty  Consultants:   Orthopedics  Antimicrobials:   Anti-infectives (From admission, onward)   Start     Dose/Rate Route Frequency Ordered Stop   08/14/20 0000  ceFAZolin (ANCEF) IVPB 2g/100 mL premix        2 g 200 mL/hr over 30 Minutes Intravenous Every 12 hours 08/13/20 1540 08/14/20 1613   08/13/20 1254  vancomycin (VANCOCIN) powder  Status:  Discontinued          As needed 08/13/20 1254 08/13/20 1408   08/13/20  0600  ceFAZolin (ANCEF) IVPB 2g/100 mL premix        2 g 200 mL/hr over 30 Minutes Intravenous On call to O.R. 08/12/20 1831 08/13/20 1200          Medications  Scheduled Meds: . sodium chloride   Intravenous Once  . buPROPion  150 mg Oral Daily  . busPIRone  5 mg Oral BID  . docusate sodium  100 mg Oral BID  . feeding supplement  237 mL Oral BID BM  . lactulose  20 g Oral Daily  . multivitamin with minerals  1 tablet Oral Daily  . pantoprazole  40 mg Oral QAC breakfast  . torsemide  20 mg Oral Daily  . warfarin  3 mg Oral ONCE-1600  . Warfarin - Pharmacist Dosing Inpatient   Does not apply q1600   Continuous Infusions: . sodium chloride 75 mL/hr at 08/16/20 2007  . lactated ringers 10 mL/hr at 08/13/20 1044  . methocarbamol (ROBAXIN) IV     PRN Meds:.acetaminophen, alum & mag hydroxide-simeth, bisacodyl, HYDROmorphone (DILAUDID) injection, magnesium citrate, menthol-cetylpyridinium **OR** phenol, methocarbamol **OR** methocarbamol (ROBAXIN) IV, ondansetron **OR** ondansetron (ZOFRAN) IV, oxyCODONE, oxyCODONE, polyethylene glycol, sorbitol      Subjective:   Mary [name] was seen and examined today.  No acute complaints, awaiting skilled nursing facility.  Pain controlled.  Patient denies dizziness, chest pain, shortness of breath, abdominal pain, N/V, new weakness. Objective:   Vitals:   08/17/20 1445 08/17/20 2124 08/18/20 0407 08/18/20 0736  BP: (!) 115/56 (!) 110/54 (!) 122/53 (!) 114/54  Pulse: 93 100 94 90  Resp: '15 16 17 17  '$ Temp: 97.8 F (36.6 C) 98.3 F (36.8 C) 97.9 F (36.6 C) 98.2 F (36.8 C)  TempSrc: Oral Oral Oral Oral  SpO2: 98% 97% 97% 93%  Weight:      Height:        Intake/Output Summary (Last 24 hours) at 08/18/2020 1255 Last data filed at 08/18/2020 0900 Gross per 24 hour  Intake 574.05 ml  Output 750 ml  Net -175.95 ml     Wt Readings from Last 3 Encounters:  08/13/20 52.2 kg  02/18/20 52.1 kg  01/17/20 52.1 kg      Exam  General: Alert and oriented x 3, NAD  Cardiovascular: S1 S2 auscultated, mechanical heart valve sound, systolic murmur  Respiratory: Fairly CTA B  Gastrointestinal: Soft, nontender, nondistended, + bowel sounds  Ext: no pedal edema bilaterally  Neuro: no new deficits  Musculoskeletal: No digital cyanosis, clubbing  Skin: No rashes  Psych: Normal affect and demeanor, alert and oriented x3  Data Reviewed:  I have personally reviewed following labs and imaging studies  Micro Results Recent Results (from the past 240 hour(s))  Resp Panel by RT-PCR (Flu A&B, Covid) Nasopharyngeal Swab     Status: None   Collection Time: 08/11/20 12:21 PM   Specimen: Nasopharyngeal Swab; Nasopharyngeal(NP) swabs in vial transport medium  Result Value Ref Range Status   SARS Coronavirus 2 by RT PCR NEGATIVE NEGATIVE Final    Comment: (NOTE) SARS-CoV-2 target nucleic acids are NOT DETECTED.  The SARS-CoV-2 RNA is generally detectable in upper respiratory specimens during the acute phase of infection. The lowest concentration of SARS-CoV-2 viral copies this assay can detect is 138 copies/mL. A negative result does not preclude SARS-Cov-2 infection and should not be used as the sole basis for treatment or other patient management decisions. A negative result may occur with  improper specimen collection/handling, submission of specimen other than nasopharyngeal swab, presence of viral mutation(s) within the areas targeted by this assay, and inadequate number of viral copies(<138 copies/mL). A negative result must be combined with clinical observations, patient history, and epidemiological information. The expected result is Negative.  Fact Sheet for Patients:  EntrepreneurPulse.com.au  Fact Sheet for Healthcare Providers:  IncredibleEmployment.be  This test is no t yet approved or cleared by the [place] FDA and  has been authorized for  detection and/or diagnosis of SARS-CoV-2 by FDA under an Emergency Use Authorization (EUA). This EUA will remain  in effect (meaning this test can be used) for the duration of the COVID-19 declaration under Section 564(b)(1) of the Act, 21 U.S.C.section 360bbb-3(b)(1), unless the authorization is terminated  or revoked sooner.       Influenza A by PCR NEGATIVE NEGATIVE Final   Influenza B by PCR NEGATIVE NEGATIVE Final    Comment: (NOTE) The Xpert Xpress SARS-CoV-2/FLU/RSV plus assay is intended as an aid in the diagnosis of influenza from Nasopharyngeal swab specimens and should not be used as a sole basis for treatment. Nasal washings and aspirates are unacceptable for Xpert Xpress SARS-CoV-2/FLU/RSV testing.  Fact Sheet for Patients: EntrepreneurPulse.com.au  Fact Sheet for Healthcare Providers: IncredibleEmployment.be  This test is not yet approved or cleared by the [place] FDA and has been authorized for detection and/or diagnosis of SARS-CoV-2 by FDA under an Emergency Use Authorization (EUA). This EUA will remain in effect (meaning this test can be used) for the duration of the COVID-19 declaration under Section 564(b)(1) of the Act, 21 U.S.C. section 360bbb-3(b)(1), unless the authorization is terminated or revoked.  Performed at [facility], [address], [place], [place] [address]   Surgical pcr screen     Status: None   Collection Time: 08/12/20  1:00 AM   Specimen: Nasal Mucosa; Nasal Swab  Result Value Ref Range Status   MRSA, PCR NEGATIVE NEGATIVE Final   Staphylococcus aureus NEGATIVE NEGATIVE Final    Comment: (NOTE) The Xpert SA Assay (FDA approved for NASAL specimens in patients 15 years of age and older), is one component of a comprehensive surveillance program. It is not intended to diagnose infection nor to guide or monitor treatment. Performed at [facility], [place] [address], [place],  [place] [address]     Radiology Reports DG Ankle Complete Left  Result Date: 08/11/2020 CLINICAL DATA:  Recent fall with left ankle pain, initial encounter EXAM: LEFT ANKLE COMPLETE - 3+ VIEW COMPARISON:  01/04/2020 FINDINGS: Mild soft tissue swelling is noted. There is a lucency noted posteriorly suggestive of posterior malleolar fracture  without significant displacement. No other fracture is seen. Well corticated bony densities are noted adjacent to the medial malleolus consistent with prior injury. This is stable from the prior exam. No other focal abnormality is noted. IMPRESSION: Changes suggestive of posterior malleolar fracture without significant displacement. Electronically Signed   By: Inez Catalina M.D.   On: 08/11/2020 14:12   CT Head Wo Contrast  Result Date: 08/11/2020 CLINICAL DATA:  Fall with trauma to the head. EXAM: CT HEAD WITHOUT CONTRAST TECHNIQUE: Contiguous axial images were obtained from the base of the skull through the vertex without intravenous contrast. COMPARISON:  02/18/2020 FINDINGS: Brain: Age related volume loss. Mild chronic small-vessel ischemic change of the pons and cerebral hemispheric white matter. No sign of acute infarction, mass lesion, hemorrhage, hydrocephalus or extra-axial collection. Vascular: There is atherosclerotic calcification of the major vessels at the base of the brain. Skull: No skull fracture. Sinuses/Orbits: Previous functional endoscopic sinus surgery on the right. Chronic inflammatory changes of the right maxillary sinus. Orbits negative. Other: None IMPRESSION: No acute intracranial finding. Atrophy and chronic small-vessel ischemic changes. Chronic inflammatory changes of the right maxillary sinus. Previous functional endoscopic sinus surgery. Electronically Signed   By: Nelson Chimes M.D.   On: 08/11/2020 14:08   CT Cervical Spine Wo Contrast  Result Date: 08/11/2020 CLINICAL DATA:  Poly trauma.  Fall. EXAM: CT CERVICAL SPINE WITHOUT CONTRAST  TECHNIQUE: Multidetector CT imaging of the cervical spine was performed without intravenous contrast. Multiplanar CT image reconstructions were also generated. COMPARISON:  January 15, 2020. FINDINGS: Alignment: Similar alignment to the prior. Similar mild straightening without substantial sagittal subluxation. Mild widening of the C4-C5 disc space anteriorly is unchanged from the prior. Mild broad dextrocurvature. Skull base and vertebrae: Similar C5-C7 solid osseous fusion. No evidence of acute fracture. Vertebral body heights are maintained. Soft tissues and spinal canal: No prevertebral fluid or swelling. No visible canal hematoma. Disc levels: Posterior disc bulges at multiple mid upper cervical levels. At C3-C4 there is disc bulge and ligamentum flavum thickening with possibly moderate canal stenosis. Multilevel facet hypertrophy, greatest on the left at C4-C5. Upper chest: Visualized lung apices are clear. Other: Calcific atherosclerosis of the carotid arteries. IMPRESSION: 1. No evidence of acute fracture or traumatic malalignment. 2. Multiple posterior disc bulges with possibly moderate canal stenosis at C3-C4. An MRI could better evaluate the canal/cord/foramina if clinically indicated. 3. Similar osseous fusion from C5-C7. Electronically Signed   By: Margaretha Sheffield MD   On: 08/11/2020 13:52   Pelvis Portable  Result Date: 08/13/2020 CLINICAL DATA:  Left hip arthroplasty. EXAM: PORTABLE PELVIS 1-2 VIEWS COMPARISON:  None. FINDINGS: Left hip arthroplasty in expected alignment. There is no periprosthetic lucency or fracture. Recent postsurgical change includes air and edema in the soft tissues. Vascular calcifications are seen. IMPRESSION: Left hip arthroplasty without immediate postoperative complication. Electronically Signed   By: Keith Rake M.D.   On: 08/13/2020 15:19   DG Chest Port 1 View  Result Date: 08/11/2020 CLINICAL DATA:  Fall, left hip pain EXAM: PORTABLE CHEST 1 VIEW  COMPARISON:  02/18/2020 FINDINGS: Small right pleural effusion with associated pleural thickening, chronic. Right lower lobe scarring/atelectasis. Left lung is clear.  No pneumothorax. Mild cardiomegaly.  Thoracic aortic atherosclerosis. Median sternotomy. IMPRESSION: Small right pleural effusion with associated lower lobe scarring and pleural thickening, chronic. Electronically Signed   By: Julian Hy M.D.   On: 08/11/2020 14:06   DG Knee Complete 4 Views Left  Result Date: 08/11/2020 CLINICAL DATA:  Fall with pain. EXAM: LEFT KNEE - COMPLETE 4+ VIEW COMPARISON:  None. FINDINGS: No evidence of fracture or dislocation. No joint effusion. Regional arterial calcification incidentally noted. IMPRESSION: No acute finding. Electronically Signed   By: Nelson Chimes M.D.   On: 08/11/2020 14:06   DG C-Arm 1-60 Min  Result Date: 08/13/2020 CLINICAL DATA:  Surgery, elective. Additional history provided: Left anterior hip. Provided fluoroscopy time 9 seconds (0.70 mGy). EXAM: OPERATIVE left HIP (WITH PELVIS IF PERFORMED) 4 VIEWS TECHNIQUE: Fluoroscopic spot image(s) were submitted for interpretation post-operatively. COMPARISON:  Radiographs of the left hip 08/11/2020. FINDINGS: Four intraoperative fluoroscopic images of the left hip are submitted. On the provided images, there are findings of interval left total hip arthroplasty. The femoral and acetabular components appear well seated. No unexpected finding on the provided views. IMPRESSION: Four intraoperative fluoroscopic images of the left hip from left hip total arthroplasty, as described. Electronically Signed   By: Kellie Simmering DO   On: 08/13/2020 14:18   ECHOCARDIOGRAM COMPLETE BUBBLE STUDY  Result Date: 08/12/2020    ECHOCARDIOGRAM REPORT   Patient Name:   Mary [name] Date of Exam: 08/12/2020 Medical Rec #:  [id number]   Height:       67.0 in Accession #:    [id number]  Weight:       115.0 lb Date of Birth:  February 01, 1941   BSA:          1.598 m Patient  Age:    13 years    BP:           123/51 mmHg Patient Gender: F           HR:           95 bpm. Exam Location:  Inpatient Procedure: 2D Echo STAT ECHO Indications:    right heart failure  History:        Patient has prior history of Echocardiogram examinations, most                 recent 10/23/2019. Chronic kidney disease; Risk                 Factors:Hypertension, Dyslipidemia and Sleep Apnea.                 Aortic Valve: mechanical valve is present in the aortic                 position.  Sonographer:    Johny Chess Referring Phys: [place]  1. Mechanical AoV. Vmax 1.8 m/s, MG 7 mmHG, EOA 2.45 cm2, DI 0.71. There appears to be trivial central regurgitation. Otherwise the valve is within normal limits. The aortic valve has been repaired/replaced. Aortic valve regurgitation is trivial. There is a mechanical valve present in the aortic position.  2. Right ventricular systolic function is moderately reduced. The right ventricular size is moderately enlarged. There is moderately elevated pulmonary artery systolic pressure. The estimated right ventricular systolic pressure is [id number] mmHg.  3. The tricuspid valve is abnormal. Tricuspid valve regurgitation is severe.  4. Left ventricular ejection fraction, by estimation, is 65 to 70%. The left ventricle has normal function. The left ventricle has no regional wall motion abnormalities. Indeterminate diastolic filling due to E-A fusion. There is the interventricular septum is flattened in systole and diastole, consistent with right ventricular pressure and volume overload.  5. Left atrial size was severely dilated.  6. Right atrial size was severely dilated.  7. The mitral valve  is degenerative. Moderate mitral valve regurgitation. Mild mitral stenosis. The mean mitral valve gradient is 4.9 mmHg with average heart rate of 97 bpm. Moderate mitral annular calcification.  8. The inferior vena cava is dilated in size with <50% respiratory variability,  suggesting right atrial pressure of 15 mmHg.  9. Agitated saline contrast bubble study was negative, with no evidence of any interatrial shunt. Comparison(s): No significant change from prior study. FINDINGS  Left Ventricle: Left ventricular ejection fraction, by estimation, is 65 to 70%. The left ventricle has normal function. The left ventricle has no regional wall motion abnormalities. The left ventricular internal cavity size was normal in size. There is  no left ventricular hypertrophy. The interventricular septum is flattened in systole and diastole, consistent with right ventricular pressure and volume overload. Indeterminate diastolic filling due to E-A fusion. Right Ventricle: The right ventricular size is moderately enlarged. No increase in right ventricular wall thickness. Right ventricular systolic function is moderately reduced. There is moderately elevated pulmonary artery systolic pressure. The tricuspid  regurgitant velocity is 3.05 m/s, and with an assumed right atrial pressure of 15 mmHg, the estimated right ventricular systolic pressure is [id number] mmHg. Left Atrium: Left atrial size was severely dilated. Right Atrium: Right atrial size was severely dilated. Pericardium: Trivial pericardial effusion is present. Mitral Valve: The mitral valve is degenerative in appearance. Moderate mitral annular calcification. Moderate mitral valve regurgitation. Mild mitral valve stenosis. The mean mitral valve gradient is 4.9 mmHg with average heart rate of 97 bpm. Tricuspid Valve: The tricuspid valve is abnormal. Tricuspid valve regurgitation is severe. No evidence of tricuspid stenosis. The flow in the hepatic veins is reversed during ventricular systole. Aortic Valve: Mechanical AoV. Vmax 1.8 m/s, MG 7 mmHG, EOA 2.45 cm2, DI 0.71. There appears to be trivial central regurgitation. Otherwise the valve is within normal limits. The aortic valve has been repaired/replaced. Aortic valve regurgitation is trivial.  Aortic valve mean gradient measures 7.0 mmHg. Aortic valve peak gradient measures 12.7 mmHg. Aortic valve area, by VTI measures 2.45 cm. There is a mechanical valve present in the aortic position. Pulmonic Valve: The pulmonic valve was grossly normal. Pulmonic valve regurgitation is mild. No evidence of pulmonic stenosis. Aorta: The aortic root and ascending aorta are structurally normal, with no evidence of dilitation. Venous: The inferior vena cava is dilated in size with less than 50% respiratory variability, suggesting right atrial pressure of 15 mmHg. IAS/Shunts: No atrial level shunt detected by color flow Doppler. Agitated saline contrast was given intravenously to evaluate for intracardiac shunting. Agitated saline contrast bubble study was negative, with no evidence of any interatrial shunt.  LEFT VENTRICLE PLAX 2D LVIDd:         4.50 cm     Diastology LVIDs:         3.40 cm     LV e' medial:  6.74 cm/s LV PW:         1.10 cm     LV e' lateral: 10.10 cm/s LV IVS:        1.10 cm LVOT diam:     2.10 cm LV SV:         76 LV SV Index:   47 LVOT Area:     3.46 cm  LV Volumes (MOD) LV vol d, MOD A2C: 75.7 ml LV vol d, MOD A4C: 45.2 ml LV vol s, MOD A2C: 34.4 ml LV vol s, MOD A4C: 26.4 ml LV SV MOD A2C:     41.3 ml LV  SV MOD A4C:     45.2 ml LV SV MOD BP:      28.6 ml RIGHT VENTRICLE            IVC RV S prime:     7.51 cm/s  IVC diam: 2.60 cm TAPSE (M-mode): 0.9 cm LEFT ATRIUM           Index       RIGHT ATRIUM           Index LA diam:      3.90 cm 2.44 cm/m  RA Area:     19.90 cm LA Vol (A2C): 46.0 ml 28.78 ml/m RA Volume:   61.50 ml  38.48 ml/m LA Vol (A4C): 78.9 ml 49.36 ml/m  AORTIC VALVE AV Area (Vmax):    2.55 cm AV Area (Vmean):   2.54 cm AV Area (VTI):     2.45 cm AV Vmax:           178.00 cm/s AV Vmean:          124.000 cm/s AV VTI:            0.308 m AV Peak Grad:      12.7 mmHg AV Mean Grad:      7.0 mmHg LVOT Vmax:         131.20 cm/s LVOT Vmean:        91.050 cm/s LVOT VTI:          0.218 m  LVOT/AV VTI ratio: 0.71  AORTA Ao Root diam: 2.80 cm Ao Asc diam:  3.60 cm MITRAL VALVE           TRICUSPID VALVE MV Area VTI:  2.59 cm TV Peak grad:   39.9 mmHg MV Mean grad: 4.9 mmHg TV Vmax:        3.16 m/s MV VTI:       0.29 m   TR Peak grad:   37.2 mmHg                        TR Vmax:        305.00 cm/s                         SHUNTS                        Systemic VTI:  0.22 m                        Systemic Diam: 2.10 cm Eleonore Chiquito MD Electronically signed by Eleonore Chiquito MD Signature Date/Time: 08/12/2020/10:40:32 AM    Final    DG HIP OPERATIVE UNILAT WITH PELVIS LEFT  Result Date: 08/13/2020 CLINICAL DATA:  Surgery, elective. Additional history provided: Left anterior hip. Provided fluoroscopy time 9 seconds (0.70 mGy). EXAM: OPERATIVE left HIP (WITH PELVIS IF PERFORMED) 4 VIEWS TECHNIQUE: Fluoroscopic spot image(s) were submitted for interpretation post-operatively. COMPARISON:  Radiographs of the left hip 08/11/2020. FINDINGS: Four intraoperative fluoroscopic images of the left hip are submitted. On the provided images, there are findings of interval left total hip arthroplasty. The femoral and acetabular components appear well seated. No unexpected finding on the provided views. IMPRESSION: Four intraoperative fluoroscopic images of the left hip from left hip total arthroplasty, as described. Electronically Signed   By: Kellie Simmering DO   On: 08/13/2020 14:18   DG Hip Unilat W or Wo Pelvis  2-3 Views Left  Result Date: 08/11/2020 CLINICAL DATA:  Fall with left-sided pain. EXAM: DG HIP (WITH OR WITHOUT PELVIS) 2-3V LEFT COMPARISON:  None. FINDINGS: Angulated femoral neck fracture on the left. No intertrochanteric component. Pelvic bones appear intact. IMPRESSION: Angulated fracture of the left femoral neck. Electronically Signed   By: Nelson Chimes M.D.   On: 08/11/2020 14:08    Lab Data:  CBC: Recent Labs  Lab 08/13/20 1608 08/14/20 0220 08/14/20 2310 08/15/20 1455 08/16/20 0138   WBC 6.6 6.9 7.6 6.6 7.7  NEUTROABS  --   --  5.1 4.2  --   HGB 10.0* 8.7* 7.9* 7.6* 7.7*  HCT 31.7* 27.3* 24.2* 24.5* 24.4*  MCV 107.5* 107.1* 106.6* 110.9* 108.9*  PLT 146* 128* 134* 120* [id number]   Basic Metabolic Panel: Recent Labs  Lab 08/11/20 1509 08/12/20 1902 08/13/20 1608 08/14/20 0220 08/14/20 2310 08/15/20 1455  NA 135 136  --  133* 133* 134*  K 3.8 4.4  --  4.4 4.3 4.8  CL 100 102  --  100 104 107  CO2 25 26  --  22 21* 21*  GLUCOSE 100* 125*  --  122* 140* 149*  BUN 24* 30*  --  30* 38* 39*  CREATININE 1.31* 1.82* 1.62* 1.53* 1.79* 1.71*  CALCIUM 9.7 8.7*  --  8.3* 8.0* 8.1*   GFR: Estimated Creatinine Clearance: 20.9 mL/min (A) (by C-G formula based on SCr of 1.71 mg/dL (H)). Liver Function Tests: Recent Labs  Lab 08/11/20 1509  AST 28  ALT 16  ALKPHOS 109  BILITOT 1.2  PROT 9.0*  ALBUMIN 4.0   No results for input(s): LIPASE, AMYLASE in the last 168 hours. No results for input(s): AMMONIA in the last 168 hours. Coagulation Profile: Recent Labs  Lab 08/14/20 2310 08/15/20 1641 08/16/20 0138 08/17/20 0136 08/18/20 0152  INR 1.6* 1.9* 2.1* 2.9* 3.2*   Cardiac Enzymes: No results for input(s): CKTOTAL, CKMB, CKMBINDEX, TROPONINI in the last 168 hours. BNP (last 3 results) No results for input(s): PROBNP in the last 8760 hours. HbA1C: No results for input(s): HGBA1C in the last 72 hours. CBG: Recent Labs  Lab 08/11/20 1914 08/13/20 1045 08/13/20 1414  GLUCAP 99 97 100*   Lipid Profile: No results for input(s): CHOL, HDL, LDLCALC, TRIG, CHOLHDL, LDLDIRECT in the last 72 hours. Thyroid Function Tests: No results for input(s): TSH, T4TOTAL, FREET4, T3FREE, THYROIDAB in the last 72 hours. Anemia Panel: No results for input(s): VITAMINB12, FOLATE, FERRITIN, TIBC, IRON, RETICCTPCT in the last 72 hours. Urine analysis:    Component Value Date/Time   COLORURINE STRAW (A) 08/11/2020 1221   APPEARANCEUR CLEAR 08/11/2020 1221   LABSPEC 1.005  08/11/2020 1221   PHURINE 6.0 08/11/2020 1221   GLUCOSEU NEGATIVE 08/11/2020 1221   [place] 08/11/2020 [place] 08/11/2020 [place] 08/11/2020 1221   PROTEINUR NEGATIVE 08/11/2020 1221   UROBILINOGEN 2.0 (H) 11/01/2007 1518   NITRITE NEGATIVE 08/11/2020 1221   LEUKOCYTESUR NEGATIVE 08/11/2020 1221     Laurelai Lepp M.D. [facility] 08/18/2020, 12:55 PM  Available via Epic secure chat 7am-7pm After 7 pm, please refer to night coverage provider listed on amion.

## 2020-08-18 NOTE — Progress Notes (Signed)
[place] for Coumadin Indication: Mechanical heart valve, AFib   Labs: Recent Labs    08/15/20 1455 08/15/20 1641 08/16/20 0138 08/16/20 1015 08/17/20 0136 08/18/20 0152  HGB 7.6*  --  7.7*  --   --   --   HCT 24.5*  --  24.4*  --   --   --   PLT 120*  --  130*  --   --   --   LABPROT  --    < > 23.7*  --  30.1* 32.4*  INR  --    < > 2.1*  --  2.9* 3.2*  HEPARINUNFRC  --    < > 0.26* 0.33 0.21*  --   CREATININE 1.71*  --   --   --   --   --    < > = values in this interval not displayed.    Assessment: 37 yoF admitted from independent living facility s/p mechanical fall. On warfarin PTA for mechanical AVR and AFib. INR reversed with vitamin K 10 mg IV on 5/26. 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 Pharmacy consulted to dose Coumadin.  Off heparin bridge 5/31.  INR therapeutic and trending up; rate of increase is less significant.  No bleeding reported.  Goal of Therapy:  INR goal 2.5-3.5 Monitor platelets by anticoagulation protocol: Yes   Plan:  Repeat Coumadin '3mg'$  PO today Daily PT / INR  Emmelina Mcloughlin D. Mina Marble, PharmD, BCPS, [place] 08/18/2020, 8:22 AM

## 2020-08-18 NOTE — Care Management Important Message (Signed)
Important Message  Patient Details  Name: Mary [name] MRN: [id number] Date of Birth: 12/06/1940   Medicare Important Message Given:  Yes     Barb Merino Sheldon 08/18/2020, 12:54 PM

## 2020-08-19 LAB — RESP PANEL BY RT-PCR (FLU A&B, COVID) ARPGX2
Influenza A by PCR: NEGATIVE
Influenza B by PCR: NEGATIVE
SARS Coronavirus 2 by RT PCR: NEGATIVE

## 2020-08-19 LAB — PROTIME-INR
INR: 2.9 — ABNORMAL HIGH (ref 0.8–1.2)
Prothrombin Time: 30.2 seconds — ABNORMAL HIGH (ref 11.4–15.2)

## 2020-08-19 MED ORDER — WARFARIN SODIUM 3 MG PO TABS
3.0000 mg | ORAL_TABLET | Freq: Every day | ORAL | Status: DC
Start: 2020-08-19 — End: 2022-04-28

## 2020-08-19 MED ORDER — DOCUSATE SODIUM 100 MG PO CAPS: 100.0000 mg | ORAL_CAPSULE | Freq: Two times a day (BID) | ORAL | 0 refills | Status: DC

## 2020-08-19 MED ORDER — METHOCARBAMOL 500 MG PO TABS: 500.0000 mg | ORAL_TABLET | Freq: Four times a day (QID) | ORAL | Status: DC | PRN

## 2020-08-19 MED ORDER — WARFARIN SODIUM 5 MG PO TABS: 5.0000 mg | ORAL_TABLET | Freq: Once | ORAL | Status: DC

## 2020-08-19 NOTE — Plan of Care (Signed)
  Problem: Education: Goal: Knowledge of General Education information will improve Description: Including pain rating scale, medication(s)/side effects and non-pharmacologic comfort measures Outcome: Adequate for Discharge   Problem: Health Behavior/Discharge Planning: Goal: Ability to manage health-related needs will improve Outcome: Adequate for Discharge   Problem: Clinical Measurements: Goal: Ability to maintain clinical measurements within normal limits will improve Outcome: Adequate for Discharge Goal: Will remain free from infection Outcome: Adequate for Discharge Goal: Diagnostic test results will improve Outcome: Adequate for Discharge Goal: Respiratory complications will improve Outcome: Adequate for Discharge Goal: Cardiovascular complication will be avoided Outcome: Adequate for Discharge   Problem: Activity: Goal: Risk for activity intolerance will decrease Outcome: Adequate for Discharge   Problem: Nutrition: Goal: Adequate nutrition will be maintained Outcome: Adequate for Discharge   Problem: Coping: Goal: Level of anxiety will decrease Outcome: Adequate for Discharge   Problem: Elimination: Goal: Will not experience complications related to bowel motility Outcome: Adequate for Discharge Goal: Will not experience complications related to urinary retention Outcome: Adequate for Discharge   Problem: Pain Managment: Goal: General experience of comfort will improve Outcome: Adequate for Discharge   Problem: Safety: Goal: Ability to remain free from injury will improve Outcome: Adequate for Discharge   Problem: Skin Integrity: Goal: Risk for impaired skin integrity will decrease Outcome: Adequate for Discharge   Problem: Malnutrition  (NI-5.2) Goal: Food and/or nutrient delivery Description: Individualized approach for food/nutrient provision. Outcome: Adequate for Discharge   Problem: Acute Rehab OT Goals (only OT should resolve) Goal: Pt. Will  Perform Lower Body Dressing Outcome: Adequate for Discharge Goal: Pt. Will Transfer To Toilet Outcome: Adequate for Discharge Goal: Pt. Will Perform Toileting-Clothing Manipulation Outcome: Adequate for Discharge Goal: OT Additional ADL Goal #1 Outcome: Adequate for Discharge Goal: OT Additional ADL Goal #2 Outcome: Adequate for Discharge   Problem: Acute Rehab PT Goals(only PT should resolve) Goal: Pt Will Go Supine/Side To Sit Outcome: Adequate for Discharge Goal: Patient Will Perform Sitting Balance Outcome: Adequate for Discharge Goal: Patient Will Transfer Sit To/From Stand Outcome: Adequate for Discharge Goal: Pt Will Ambulate Outcome: Adequate for Discharge Goal: Pt/caregiver will Perform Home Exercise Program Outcome: Adequate for Discharge

## 2020-08-19 NOTE — Progress Notes (Signed)
[place] for Coumadin Indication: Mechanical heart valve, AFib   Labs: Recent Labs    08/16/20 1015 08/17/20 0136 08/18/20 0152 08/18/20 1343 08/19/20 0324  HGB  --   --   --  7.6*  --   HCT  --   --   --  24.2*  --   PLT  --   --   --  189  --   LABPROT  --  30.1* 32.4*  --  30.2*  INR  --  2.9* 3.2*  --  2.9*  HEPARINUNFRC 0.33 0.21*  --   --   --   CREATININE  --   --   --  1.46*  --     Assessment: 1 yoF admitted from independent living facility s/p mechanical fall. On warfarin PTA for mechanical AVR and AFib. INR reversed with vitamin K 10 mg IV on 5/26. 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  Pharmacy consulted to dose Coumadin.  Off heparin bridge 5/31.  INR therapeutic; no bleeding reported.  Goal of Therapy:  INR goal 2.5-3.5 Monitor platelets by anticoagulation protocol: Yes   Plan:  Coumadin '5mg'$  PO today Daily PT / INR  Alphonse Asbridge D. Mina Marble, PharmD, BCPS, [place] 08/19/2020, 7:58 AM

## 2020-08-19 NOTE — Discharge Summary (Addendum)
Physician Discharge Summary   Patient ID: Mary [name] MRN: [id number] DOB/AGE: 08-17-1940 80 y.o.  Admit date: 08/11/2020 Discharge date: 08/19/2020  Primary Care Physician:  Bonnita Nasuti, MD   Recommendations for Outpatient Follow-up:  1. Follow up with PCP in 1-2 weeks 2. Continue PT OT, outpatient follow-up with Dr. Erlinda Hong in 2 weeks 3. Per orthopedics, she will remain in cam brace to support the left ankle injury and prevent heel cord contracture 4. Follow PT/INR, adjust Coumadin level  Home Health: Patient discharging to skilled nursing facility for rehab  equipment/Devices:   Discharge Condition: stable  CODE STATUS: Full Diet recommendation: Heart healthy diet   Discharge Diagnoses:    . Closed displaced fracture of the left femoral neck (HCC)  Left posterior malleolus ankle fracture  History of mechanical aortic valve on Coumadin  Acute blood loss anemia on chronic anemia, macrocytic . Mild acute on CKD (chronic kidney disease), stage IIIb ([place]) . Essential hypertension . Fall . Hyperlipidemia . Persistent atrial fibrillation (HCC) Moderate degree malnutrition  Consults: Orthopedics    Allergies:   Allergies  Allergen Reactions  . Tape Itching    Redness, Please use "paper" tape only     DISCHARGE MEDICATIONS: Allergies as of 08/19/2020      Reactions   Tape Itching   Redness, Please use "paper" tape only      Medication List    STOP taking these medications   HYDROcodone-acetaminophen 10-325 MG tablet Commonly known as: NORCO   spironolactone 100 MG tablet Commonly known as: ALDACTONE     TAKE these medications   acetaminophen 500 MG tablet Commonly known as: TYLENOL Take 500 mg by mouth 2 (two) times daily as needed for mild pain or moderate pain.   bisacodyl 5 MG EC tablet Commonly known as: DULCOLAX Take 5 mg by mouth daily as needed for moderate constipation.   buPROPion 150 MG 12 hr tablet Commonly known as: WELLBUTRIN SR Take 150  mg by mouth daily.   busPIRone 5 MG tablet Commonly known as: BUSPAR Take 5 mg by mouth 2 (two) times daily.   cetirizine 10 MG tablet Commonly known as: ZYRTEC Take 10 mg by mouth at bedtime as needed for allergies.   docusate sodium 100 MG capsule Commonly known as: COLACE Take 1 capsule (100 mg total) by mouth 2 (two) times daily.   fluticasone 50 MCG/ACT nasal spray Commonly known as: FLONASE Place 1 spray into both nostrils as needed.   lactulose 10 GM/15ML solution Commonly known as: CHRONULAC Take 30 mLs (20 g total) by mouth daily.   methocarbamol 500 MG tablet Commonly known as: ROBAXIN Take 1 tablet (500 mg total) by mouth every 6 (six) hours as needed for muscle spasms.   oxyCODONE-acetaminophen 5-325 MG tablet Commonly known as: Percocet Take 1-2 tablets by mouth every 8 (eight) hours as needed for severe pain.   pantoprazole 40 MG tablet Commonly known as: PROTONIX Take 1 tablet (40 mg total) by mouth daily before breakfast.   torsemide 20 MG tablet Commonly known as: DEMADEX Take 20 mg by mouth daily.   warfarin 3 MG tablet Commonly known as: COUMADIN Take 1 tablet (3 mg total) by mouth daily. What changed:   medication strength  how much to take            Discharge Care Instructions  (From admission, onward)         Start     Ordered   08/19/20 0000  If the  dressing is still on your incision site when you go home, remove it on the third day after your surgery date. Remove dressing if it begins to fall off, or if it is dirty or damaged before the third day.        08/19/20 1009   08/13/20 0000  Weight bearing as tolerated        08/13/20 1125           Brief H and P: For complete details please refer to admission H and P, but in brief Patient is a 80 year old female with persistent A. fib, chronic diastolic CHF, recurrent pleural effusion, mechanical aortic valve in 1995 on Coumadin, hypertension, hyperlipidemia, OSA psoriasis,  orthostatic hypotension, prior history of CVA, T2DM, lives independently at [facility].  Patient tripped over a cord and fell hurting her entire left side and had intense left hip pain, left arm and left knee pain.  Patient was at independent living facility [facility].  Patient could not ambulate after the fall. In ED, INR 2.2, creatinine 1.3 at baseline, hemoglobin 11.8.  Imaging showed left posterior malleolus ankle fracture, left hip fracture  Underwent left hip hemiarthroplasty on 08/13/2020 (Dr Erlinda Hong)    [facility] Course:   Closed displaced fracture of left femoral neck ([place]) -Status post a left hip hemiarthroplasty on 08/13/20 -Pain control, continue bowel regimen, PT OT   Left posterior malleolus ankle fracture -Per orthopedics she will remain in cam brace to support the left ankle injury and prevent heel cord contracture -Continue PT OT, outpatient follow-up  Persistent atrial fibrillation, History of mechanical aortic valve, on Coumadin -Heart rate controlled -Continue Coumadin, 3 mg daily, per outpatient dose.  Follow INR closely -INR currently at 2.9 at the time of discharge.  Acute blood loss anemia on chronic anemia, macrocytic -Hemoglobin 7.6 at the time of discharge has remained ~7.6 in the last 72 hours, no bleeding. - Folic acid [id number] within normal limits    Hyperlipidemia -Continue statin  History of combined systolic and diastolic CHF, mild pulmonary hypertension -Currently euvolemic, no acute issues -Follow I's and O's closely, continue torsemide 20 mg daily     Essential hypertension -BP stable  Mild acute on CKD (chronic kidney disease), stage IIIb  (HCC) -Baseline creatinine ~1.5, as high as 2.0 in the past year -Creatinine at the time of admission 1.8 -Improving, creatinine at baseline, 1.4 at the time of discharge     Malnutrition of moderate degree Estimated body mass index is 18.01 kg/m as calculated from the following:   Height as of  this encounter: '5\' 7"'$  (1.702 m).   Weight as of this encounter: 52.2 kg.  -Dietitian consulted, continue nutritional supplements    Day of Discharge S: No acute complaints, hoping to be discharged today.  Husband at the bedside.  BP (!) 123/54 (BP Location: Left Arm)   Pulse 93   Temp 99 F (37.2 C) (Oral)   Resp 17   Ht '5\' 7"'$  (1.702 m)   Wt 52.2 kg   SpO2 98%   BMI 18.01 kg/m   Physical Exam: General: Alert and awake oriented x3 not in any acute distress. CVS: S1-S2 clear, mechanical heart valve sound, systolic murmur Chest: clear to auscultation bilaterally, no wheezing rales or rhonchi Abdomen: soft nontender, nondistended, normal bowel sounds Extremities: no cyanosis, clubbing or edema noted bilaterally Neuro: no new FND's    Get Medicines reviewed and adjusted: Please take all your medications with you for your next visit with your Primary MD  Please request your Primary MD to go over all hospital tests and procedure/radiological results at the follow up. Please ask your Primary MD to get all Hospital records sent to his/her office.  If you experience worsening of your admission symptoms, develop shortness of breath, life threatening emergency, suicidal or homicidal thoughts you must seek medical attention immediately by calling 911 or calling your MD immediately  if symptoms less severe.  You must read complete instructions/literature along with all the possible adverse reactions/side effects for all the Medicines you take and that have been prescribed to you. Take any new Medicines after you have completely understood and accept all the possible adverse reactions/side effects.   Do not drive when taking pain medications.   Do not take more than prescribed Pain, Sleep and Anxiety Medications  Special Instructions: If you have smoked or chewed Tobacco  in the last 2 yrs please stop smoking, stop any regular Alcohol  and or any Recreational drug use.  Wear Seat belts  while driving.  Please note  You were cared for by a hospitalist during your hospital stay. Once you are discharged, your primary care physician will handle any further medical issues. Please note that NO REFILLS for any discharge medications will be authorized once you are discharged, as it is imperative that you return to your primary care physician (or establish a relationship with a primary care physician if you do not have one) for your aftercare needs so that they can reassess your need for medications and monitor your lab values.   The results of significant diagnostics from this hospitalization (including imaging, microbiology, ancillary and laboratory) are listed below for reference.      Procedures/Studies:  DG Ankle Complete Left  Result Date: 08/11/2020 CLINICAL DATA:  Recent fall with left ankle pain, initial encounter EXAM: LEFT ANKLE COMPLETE - 3+ VIEW COMPARISON:  01/04/2020 FINDINGS: Mild soft tissue swelling is noted. There is a lucency noted posteriorly suggestive of posterior malleolar fracture without significant displacement. No other fracture is seen. Well corticated bony densities are noted adjacent to the medial malleolus consistent with prior injury. This is stable from the prior exam. No other focal abnormality is noted. IMPRESSION: Changes suggestive of posterior malleolar fracture without significant displacement. Electronically Signed   By: Inez Catalina M.D.   On: 08/11/2020 14:12   CT Head Wo Contrast  Result Date: 08/11/2020 CLINICAL DATA:  Fall with trauma to the head. EXAM: CT HEAD WITHOUT CONTRAST TECHNIQUE: Contiguous axial images were obtained from the base of the skull through the vertex without intravenous contrast. COMPARISON:  02/18/2020 FINDINGS: Brain: Age related volume loss. Mild chronic small-vessel ischemic change of the pons and cerebral hemispheric white matter. No sign of acute infarction, mass lesion, hemorrhage, hydrocephalus or extra-axial  collection. Vascular: There is atherosclerotic calcification of the major vessels at the base of the brain. Skull: No skull fracture. Sinuses/Orbits: Previous functional endoscopic sinus surgery on the right. Chronic inflammatory changes of the right maxillary sinus. Orbits negative. Other: None IMPRESSION: No acute intracranial finding. Atrophy and chronic small-vessel ischemic changes. Chronic inflammatory changes of the right maxillary sinus. Previous functional endoscopic sinus surgery. Electronically Signed   By: Nelson Chimes M.D.   On: 08/11/2020 14:08   CT Cervical Spine Wo Contrast  Result Date: 08/11/2020 CLINICAL DATA:  Poly trauma.  Fall. EXAM: CT CERVICAL SPINE WITHOUT CONTRAST TECHNIQUE: Multidetector CT imaging of the cervical spine was performed without intravenous contrast. Multiplanar CT image reconstructions were also generated. COMPARISON:  January 15, 2020. FINDINGS: Alignment: Similar alignment to the prior. Similar mild straightening without substantial sagittal subluxation. Mild widening of the C4-C5 disc space anteriorly is unchanged from the prior. Mild broad dextrocurvature. Skull base and vertebrae: Similar C5-C7 solid osseous fusion. No evidence of acute fracture. Vertebral body heights are maintained. Soft tissues and spinal canal: No prevertebral fluid or swelling. No visible canal hematoma. Disc levels: Posterior disc bulges at multiple mid upper cervical levels. At C3-C4 there is disc bulge and ligamentum flavum thickening with possibly moderate canal stenosis. Multilevel facet hypertrophy, greatest on the left at C4-C5. Upper chest: Visualized lung apices are clear. Other: Calcific atherosclerosis of the carotid arteries. IMPRESSION: 1. No evidence of acute fracture or traumatic malalignment. 2. Multiple posterior disc bulges with possibly moderate canal stenosis at C3-C4. An MRI could better evaluate the canal/cord/foramina if clinically indicated. 3. Similar osseous fusion from  C5-C7. Electronically Signed   By: Margaretha Sheffield MD   On: 08/11/2020 13:52   Pelvis Portable  Result Date: 08/13/2020 CLINICAL DATA:  Left hip arthroplasty. EXAM: PORTABLE PELVIS 1-2 VIEWS COMPARISON:  None. FINDINGS: Left hip arthroplasty in expected alignment. There is no periprosthetic lucency or fracture. Recent postsurgical change includes air and edema in the soft tissues. Vascular calcifications are seen. IMPRESSION: Left hip arthroplasty without immediate postoperative complication. Electronically Signed   By: Keith Rake M.D.   On: 08/13/2020 15:19   DG Chest Port 1 View  Result Date: 08/11/2020 CLINICAL DATA:  Fall, left hip pain EXAM: PORTABLE CHEST 1 VIEW COMPARISON:  02/18/2020 FINDINGS: Small right pleural effusion with associated pleural thickening, chronic. Right lower lobe scarring/atelectasis. Left lung is clear.  No pneumothorax. Mild cardiomegaly.  Thoracic aortic atherosclerosis. Median sternotomy. IMPRESSION: Small right pleural effusion with associated lower lobe scarring and pleural thickening, chronic. Electronically Signed   By: Julian Hy M.D.   On: 08/11/2020 14:06   DG Knee Complete 4 Views Left  Result Date: 08/11/2020 CLINICAL DATA:  Fall with pain. EXAM: LEFT KNEE - COMPLETE 4+ VIEW COMPARISON:  None. FINDINGS: No evidence of fracture or dislocation. No joint effusion. Regional arterial calcification incidentally noted. IMPRESSION: No acute finding. Electronically Signed   By: Nelson Chimes M.D.   On: 08/11/2020 14:06   DG C-Arm 1-60 Min  Result Date: 08/13/2020 CLINICAL DATA:  Surgery, elective. Additional history provided: Left anterior hip. Provided fluoroscopy time 9 seconds (0.70 mGy). EXAM: OPERATIVE left HIP (WITH PELVIS IF PERFORMED) 4 VIEWS TECHNIQUE: Fluoroscopic spot image(s) were submitted for interpretation post-operatively. COMPARISON:  Radiographs of the left hip 08/11/2020. FINDINGS: Four intraoperative fluoroscopic images of the left hip  are submitted. On the provided images, there are findings of interval left total hip arthroplasty. The femoral and acetabular components appear well seated. No unexpected finding on the provided views. IMPRESSION: Four intraoperative fluoroscopic images of the left hip from left hip total arthroplasty, as described. Electronically Signed   By: Kellie Simmering DO   On: 08/13/2020 14:18   ECHOCARDIOGRAM COMPLETE BUBBLE STUDY  Result Date: 08/12/2020    ECHOCARDIOGRAM REPORT   Patient Name:   Mary [name] Date of Exam: 08/12/2020 Medical Rec #:  [id number]   Height:       67.0 in Accession #:    [id number]  Weight:       115.0 lb Date of Birth:  12-Oct-1940   BSA:          1.598 m Patient Age:    14 years    BP:  123/51 mmHg Patient Gender: F           HR:           95 bpm. Exam Location:  Inpatient Procedure: 2D Echo STAT ECHO Indications:    right heart failure  History:        Patient has prior history of Echocardiogram examinations, most                 recent 10/23/2019. Chronic kidney disease; Risk                 Factors:Hypertension, Dyslipidemia and Sleep Apnea.                 Aortic Valve: mechanical valve is present in the aortic                 position.  Sonographer:    Johny Chess Referring Phys: [place]  1. Mechanical AoV. Vmax 1.8 m/s, MG 7 mmHG, EOA 2.45 cm2, DI 0.71. There appears to be trivial central regurgitation. Otherwise the valve is within normal limits. The aortic valve has been repaired/replaced. Aortic valve regurgitation is trivial. There is a mechanical valve present in the aortic position.  2. Right ventricular systolic function is moderately reduced. The right ventricular size is moderately enlarged. There is moderately elevated pulmonary artery systolic pressure. The estimated right ventricular systolic pressure is [id number] mmHg.  3. The tricuspid valve is abnormal. Tricuspid valve regurgitation is severe.  4. Left ventricular ejection fraction, by  estimation, is 65 to 70%. The left ventricle has normal function. The left ventricle has no regional wall motion abnormalities. Indeterminate diastolic filling due to E-A fusion. There is the interventricular septum is flattened in systole and diastole, consistent with right ventricular pressure and volume overload.  5. Left atrial size was severely dilated.  6. Right atrial size was severely dilated.  7. The mitral valve is degenerative. Moderate mitral valve regurgitation. Mild mitral stenosis. The mean mitral valve gradient is 4.9 mmHg with average heart rate of 97 bpm. Moderate mitral annular calcification.  8. The inferior vena cava is dilated in size with <50% respiratory variability, suggesting right atrial pressure of 15 mmHg.  9. Agitated saline contrast bubble study was negative, with no evidence of any interatrial shunt. Comparison(s): No significant change from prior study. FINDINGS  Left Ventricle: Left ventricular ejection fraction, by estimation, is 65 to 70%. The left ventricle has normal function. The left ventricle has no regional wall motion abnormalities. The left ventricular internal cavity size was normal in size. There is  no left ventricular hypertrophy. The interventricular septum is flattened in systole and diastole, consistent with right ventricular pressure and volume overload. Indeterminate diastolic filling due to E-A fusion. Right Ventricle: The right ventricular size is moderately enlarged. No increase in right ventricular wall thickness. Right ventricular systolic function is moderately reduced. There is moderately elevated pulmonary artery systolic pressure. The tricuspid  regurgitant velocity is 3.05 m/s, and with an assumed right atrial pressure of 15 mmHg, the estimated right ventricular systolic pressure is [id number] mmHg. Left Atrium: Left atrial size was severely dilated. Right Atrium: Right atrial size was severely dilated. Pericardium: Trivial pericardial effusion is present.  Mitral Valve: The mitral valve is degenerative in appearance. Moderate mitral annular calcification. Moderate mitral valve regurgitation. Mild mitral valve stenosis. The mean mitral valve gradient is 4.9 mmHg with average heart rate of 97 bpm. Tricuspid Valve: The tricuspid valve is abnormal. Tricuspid valve regurgitation is  severe. No evidence of tricuspid stenosis. The flow in the hepatic veins is reversed during ventricular systole. Aortic Valve: Mechanical AoV. Vmax 1.8 m/s, MG 7 mmHG, EOA 2.45 cm2, DI 0.71. There appears to be trivial central regurgitation. Otherwise the valve is within normal limits. The aortic valve has been repaired/replaced. Aortic valve regurgitation is trivial. Aortic valve mean gradient measures 7.0 mmHg. Aortic valve peak gradient measures 12.7 mmHg. Aortic valve area, by VTI measures 2.45 cm. There is a mechanical valve present in the aortic position. Pulmonic Valve: The pulmonic valve was grossly normal. Pulmonic valve regurgitation is mild. No evidence of pulmonic stenosis. Aorta: The aortic root and ascending aorta are structurally normal, with no evidence of dilitation. Venous: The inferior vena cava is dilated in size with less than 50% respiratory variability, suggesting right atrial pressure of 15 mmHg. IAS/Shunts: No atrial level shunt detected by color flow Doppler. Agitated saline contrast was given intravenously to evaluate for intracardiac shunting. Agitated saline contrast bubble study was negative, with no evidence of any interatrial shunt.  LEFT VENTRICLE PLAX 2D LVIDd:         4.50 cm     Diastology LVIDs:         3.40 cm     LV e' medial:  6.74 cm/s LV PW:         1.10 cm     LV e' lateral: 10.10 cm/s LV IVS:        1.10 cm LVOT diam:     2.10 cm LV SV:         76 LV SV Index:   47 LVOT Area:     3.46 cm  LV Volumes (MOD) LV vol d, MOD A2C: 75.7 ml LV vol d, MOD A4C: 45.2 ml LV vol s, MOD A2C: 34.4 ml LV vol s, MOD A4C: 26.4 ml LV SV MOD A2C:     41.3 ml LV SV MOD  A4C:     45.2 ml LV SV MOD BP:      28.6 ml RIGHT VENTRICLE            IVC RV S prime:     7.51 cm/s  IVC diam: 2.60 cm TAPSE (M-mode): 0.9 cm LEFT ATRIUM           Index       RIGHT ATRIUM           Index LA diam:      3.90 cm 2.44 cm/m  RA Area:     19.90 cm LA Vol (A2C): 46.0 ml 28.78 ml/m RA Volume:   61.50 ml  38.48 ml/m LA Vol (A4C): 78.9 ml 49.36 ml/m  AORTIC VALVE AV Area (Vmax):    2.55 cm AV Area (Vmean):   2.54 cm AV Area (VTI):     2.45 cm AV Vmax:           178.00 cm/s AV Vmean:          124.000 cm/s AV VTI:            0.308 m AV Peak Grad:      12.7 mmHg AV Mean Grad:      7.0 mmHg LVOT Vmax:         131.20 cm/s LVOT Vmean:        91.050 cm/s LVOT VTI:          0.218 m LVOT/AV VTI ratio: 0.71  AORTA Ao Root diam: 2.80 cm Ao Asc diam:  3.60 cm MITRAL  VALVE           TRICUSPID VALVE MV Area VTI:  2.59 cm TV Peak grad:   39.9 mmHg MV Mean grad: 4.9 mmHg TV Vmax:        3.16 m/s MV VTI:       0.29 m   TR Peak grad:   37.2 mmHg                        TR Vmax:        305.00 cm/s                         SHUNTS                        Systemic VTI:  0.22 m                        Systemic Diam: 2.10 cm Eleonore Chiquito MD Electronically signed by Eleonore Chiquito MD Signature Date/Time: 08/12/2020/10:40:32 AM    Final    DG HIP OPERATIVE UNILAT WITH PELVIS LEFT  Result Date: 08/13/2020 CLINICAL DATA:  Surgery, elective. Additional history provided: Left anterior hip. Provided fluoroscopy time 9 seconds (0.70 mGy). EXAM: OPERATIVE left HIP (WITH PELVIS IF PERFORMED) 4 VIEWS TECHNIQUE: Fluoroscopic spot image(s) were submitted for interpretation post-operatively. COMPARISON:  Radiographs of the left hip 08/11/2020. FINDINGS: Four intraoperative fluoroscopic images of the left hip are submitted. On the provided images, there are findings of interval left total hip arthroplasty. The femoral and acetabular components appear well seated. No unexpected finding on the provided views. IMPRESSION: Four  intraoperative fluoroscopic images of the left hip from left hip total arthroplasty, as described. Electronically Signed   By: Kellie Simmering DO   On: 08/13/2020 14:18   DG Hip Unilat W or Wo Pelvis 2-3 Views Left  Result Date: 08/11/2020 CLINICAL DATA:  Fall with left-sided pain. EXAM: DG HIP (WITH OR WITHOUT PELVIS) 2-3V LEFT COMPARISON:  None. FINDINGS: Angulated femoral neck fracture on the left. No intertrochanteric component. Pelvic bones appear intact. IMPRESSION: Angulated fracture of the left femoral neck. Electronically Signed   By: Nelson Chimes M.D.   On: 08/11/2020 14:08       LAB RESULTS: Basic Metabolic Panel: Recent Labs  Lab 08/15/20 1455 08/18/20 1343  NA 134* 132*  K 4.8 4.9  CL 107 103  CO2 21* 22  GLUCOSE 149* 179*  BUN 39* 37*  CREATININE 1.71* 1.46*  CALCIUM 8.1* 8.7*   Liver Function Tests: No results for input(s): AST, ALT, ALKPHOS, BILITOT, PROT, ALBUMIN in the last 168 hours. No results for input(s): LIPASE, AMYLASE in the last 168 hours. No results for input(s): AMMONIA in the last 168 hours. CBC: Recent Labs  Lab 08/15/20 1455 08/16/20 0138 08/18/20 1343  WBC 6.6 7.7 5.2  NEUTROABS 4.2  --   --   HGB 7.6* 7.7* 7.6*  HCT 24.5* 24.4* 24.2*  MCV 110.9* 108.9* 108.0*  PLT 120* 130* 189   Cardiac Enzymes: No results for input(s): CKTOTAL, CKMB, CKMBINDEX, TROPONINI in the last 168 hours. BNP: Invalid input(s): POCBNP CBG: Recent Labs  Lab 08/13/20 1045 08/13/20 1414  GLUCAP 97 100*       Disposition and Follow-up: Discharge Instructions    Diet - low sodium heart healthy   Complete by: As directed    If the dressing is still on your  incision site when you go home, remove it on the third day after your surgery date. Remove dressing if it begins to fall off, or if it is dirty or damaged before the third day.   Complete by: As directed    Increase activity slowly   Complete by: As directed    Weight bearing as tolerated   Complete by:  As directed        DISPOSITION: Skilled nursing facility   [place] information for follow-up providers    Leandrew Koyanagi, MD In 2 weeks.   Specialty: Orthopedic Surgery Why: For suture removal, For wound re-check Contact information: [name] [address] [phone]        Bonnita Nasuti, MD Follow up in 2 week(s).   Specialty: Internal Medicine Contact information: [place] [address] [phone]        Arnoldo Lenis, MD .   Specialty: Cardiology Contact information: [address] [place] [address] [phone]            Contact information for after-discharge care    Destination    [facility] .   Service: Skilled Nursing Contact information: [place] [phone]                   Time coordinating discharge:  35 minutes  Signed:   Estill Cotta M.D. [facility] 08/19/2020, 11:54 AM

## 2020-08-19 NOTE — TOC Transition Note (Signed)
Transition of Care [facility]) - CM/SW Discharge Note   Patient Details  Name: Mary [name] MRN: [id number] Date of Birth: 09/17/1940  Transition of Care [facility]) CM/SW Contact:  Sharin Mons, RN Phone Number: 08/19/2020, 12:33 PM   Clinical Narrative:    Patient will DC to: [place] Anticipated DC date: 08/19/2020 Family notified: yes, husband Transport by: Corey Harold   Per MD patient ready for DC today . RN, patient, patient's family, and facility notified of DC. Discharge Summary and FL2 sent to facility. RN to call report prior to discharge [phone]). DC packet on chart. Ambulance transport requested for patient.   RNCM will sign off for now as intervention is no longer needed. Please consult [place] again if new needs arise.      Barriers to Discharge: Insurance Authorization   Patient Goals and CMS Choice Patient states their goals for this hospitalization and ongoing recovery are:: to get better   Choice offered to / list presented to : [facility]  Discharge Placement                       Discharge Plan and Services                                     Social Determinants of Health (SDOH) Interventions     Readmission Risk Interventions No flowsheet data found.

## 2020-08-19 NOTE — TOC Transition Note (Signed)
Transition of Care [facility]) - CM/SW Discharge Note   Patient Details  Name: Mary [name] MRN: [id number] Date of Birth: 1940/09/03  Transition of Care [facility]) CM/SW Contact:  Sharin Mons, RN Phone Number: 08/19/2020, 12:55 PM   Clinical Narrative:    Patient will DC to: [place] Anticipated DC date: 08/19/2020 Family notified: yes Transport by: Corey Harold   Per MD patient ready for DC today. RN, patient, patient's family, and facility notified of DC. Discharge Summary and FL2 sent to facility. RN to call report prior to discharge [phone]). Rm# [phone]. DC packet on chart. Ambulance transport requested for patient.   RNCM will sign off for now as intervention is no longer needed. Please consult [place] again if new needs arise.   Final next level of care: [place] [facility]) Barriers to Discharge: No Barriers Identified   Patient Goals and CMS Choice Patient states their goals for this hospitalization and ongoing recovery are:: to get better   Choice offered to / list presented to : [facility]  Discharge Placement                       Discharge Plan and Services                                     Social Determinants of Health (SDOH) Interventions     Readmission Risk Interventions No flowsheet data found.

## 2020-08-19 NOTE — Progress Notes (Signed)
Report was called to RN at [facility]. IV's were removed with catheter intact. Waiting for [facility] to transport pt to facility.

## 2020-08-27 ENCOUNTER — Ambulatory Visit: Payer: Self-pay

## 2020-08-27 ENCOUNTER — Ambulatory Visit (INDEPENDENT_AMBULATORY_CARE_PROVIDER_SITE_OTHER): Payer: Medicare Other | Admitting: Orthopaedic Surgery

## 2020-08-27 ENCOUNTER — Encounter: Payer: Self-pay | Admitting: Orthopaedic Surgery

## 2020-08-27 DIAGNOSIS — M25572 Pain in left ankle and joints of left foot: Secondary | ICD-10-CM | POA: Diagnosis not present

## 2020-08-27 DIAGNOSIS — Z96642 Presence of left artificial hip joint: Secondary | ICD-10-CM

## 2020-08-27 NOTE — Progress Notes (Signed)
Post-Op Visit Note   Patient: Mary [name]           Date of Birth: 1940/09/12           MRN: [id number] Visit Date: 08/27/2020 PCP: Bonnita Nasuti, MD   Assessment & Plan:  Chief Complaint:  Chief Complaint  Patient presents with   Left Hip - Pain   Left Ankle - Pain   Visit Diagnoses:  1. S/P hip replacement, left   2. Pain in left ankle and joints of left foot     Plan: Patient is a pleasant 80 year old female who comes in today 2 weeks out left hip hemiarthroplasty from a femoral neck fracture as well as left ankle posterior malleolar fracture.  She has been doing well.  She is living at [facility].  She has minimal pain to the hip and ankle.  She has been compliant weightbearing in a cam walker.  Examination of the left hip reveals a fully healed surgical scar without complication.  Nylon sutures in place.  No evidence of infection or cellulitis.  Calf is soft nontender.  Left ankle exam shows minimal swelling.  No ecchymosis.  Moderate tenderness to the medial aspect.  She is neurovascularly intact distally.  Today, sutures were removed and Steri-Strips were applied.  She will remain weightbearing as tolerated to the left hip and may advance with physical therapy.  In regards to the left ankle, she will continue wearing her cam walker.  Weightbearing as tolerated.  Follow-up with [place] in 4 weeks time for repeat evaluation of the left hip and left ankle with AP pelvis x-rays and three-view x-rays of the left ankle.  Call with concerns or questions.  Follow-Up Instructions: Return in about 4 weeks (around 09/24/2020).   Orders:  Orders Placed This Encounter  Procedures   XR HIP UNILAT W OR W/O PELVIS 2-3 VIEWS LEFT   XR Ankle Complete Left   No orders of the defined types were placed in this encounter.   Imaging: No results found.  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 01/15/2020   Anticoagulant long-term use 01/15/2020   Other secondary pulmonary hypertension ([place]) 10/28/2019   Acute on chronic combined systolic and diastolic CHF (congestive heart failure) ([place])    Anasarca 04/25/2019   Hypoalbuminemia 04/25/2019   Persistent atrial fibrillation ([place]) 04/25/2019   CKD (chronic kidney disease), stage III ([place]) 04/25/2019   Anemia due to chronic kidney disease 04/25/2019   Pressure injury of skin 04/24/2019   Fall 04/23/2019   S/P right knee arthroscopy 04/13/2015   OSA (obstructive sleep apnea) 01/01/2013   Diabetes ([place]) 10/23/2012   S/P aortic valve replacement 06/30/2010   Hyperlipidemia 09/29/2008   Obesity 09/29/2008   Essential hypertension 09/29/2008   DEGENERATIVE JOINT DISEASE 09/29/2008   Past Medical History:  Diagnosis Date   Anxiety    Diabetes mellitus with neuropathy (HCC)    Diabetic neuropathy (HCC)    DJD (degenerative joint disease)    H/O aortic valve replacement    Hyperlipidemia    Hypertension    Neuromuscular disorder ([place])    neuropathy in feet   Obesity    Sleep apnea    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"   Wears glasses     Family History  Problem Relation Age of Onset   Pneumonia Sister    Dementia Mother    Diabetes Sister     Past Surgical History:  Procedure Laterality Date   ANKLE SURGERY     Left tendon repair   ANTERIOR APPROACH HEMI HIP ARTHROPLASTY Left 08/13/2020   Procedure: ANTERIOR APPROACH HEMI HIP ARTHROPLASTY;  Surgeon: Leandrew Koyanagi, MD;  Location: [place];  Service: Orthopedics;  Laterality: Left;   AORTIC VALVE REPLACEMENT  03/1994   CARDIAC CATHETERIZATION     1996 [facility]   CERVICAL SPINE SURGERY     COLONOSCOPY     HEMORRHOID SURGERY     IR PARACENTESIS  10/31/2019   KNEE ARTHROSCOPY Right 04/13/2015    Procedure: RIGHT KNEE ARTHROSCOPY WITH DEBRIDEMENT AND PARTIAL MEDIAL MENISCECTOMY;  Surgeon: Mcarthur Rossetti, MD;  Location: [place];  Service: Orthopedics;  Laterality: Right;   KNEE ARTHROSCOPY W/ MENISCECTOMY Right 04/13/2015   LUMBAR FUSION     RIGHT HEART CATH N/A 10/28/2019   Procedure: RIGHT HEART CATH;  Surgeon: Leonie Man, MD;  Location: [place] [facility];  Service: Cardiovascular;  Laterality: N/A;   Social History   Occupational History   Occupation: Disabled    Employer: RETIRED  Tobacco Use   Smoking status: Never   Smokeless tobacco: Never  Vaping Use   Vaping Use: Never used  Substance and Sexual Activity   Alcohol use: No    Alcohol/week: 0.0 standard drinks   Drug use: No   Sexual activity: Not on file

## 2020-09-21 ENCOUNTER — Telehealth: Payer: Self-pay | Admitting: Orthopaedic Surgery

## 2020-09-21 NOTE — Telephone Encounter (Signed)
I called patient's husband and advised for patient to keep boot on until follow up appointment in the office on 09/24/2020.

## 2020-09-21 NOTE — Telephone Encounter (Signed)
Pts husband Katheren Puller called on her behalf, pt is wanting to know when she can take off her boot? Pt has had it on for 5 weeks and she has an appt on 09/24/20. Pt would like a CB to discuss further.   [phone]* Ok to LVM if no answer

## 2020-09-24 ENCOUNTER — Ambulatory Visit: Payer: Medicare Other | Admitting: Orthopaedic Surgery

## 2020-09-24 ENCOUNTER — Encounter: Payer: Self-pay | Admitting: Orthopaedic Surgery

## 2020-09-24 ENCOUNTER — Ambulatory Visit: Payer: Self-pay

## 2020-09-24 DIAGNOSIS — M79644 Pain in right finger(s): Secondary | ICD-10-CM | POA: Diagnosis not present

## 2020-09-24 DIAGNOSIS — M25572 Pain in left ankle and joints of left foot: Secondary | ICD-10-CM

## 2020-09-24 DIAGNOSIS — Z96642 Presence of left artificial hip joint: Secondary | ICD-10-CM | POA: Diagnosis not present

## 2020-09-24 NOTE — Progress Notes (Signed)
Office Visit Note   Patient: Mary [name]           Date of Birth: [birth year]           MRN: [id number] Visit Date: 09/24/2020              Requested by: Bonnita Nasuti, MD [place],  [place] [id number] PCP: Bonnita Nasuti, MD   Assessment & Plan: Visit Diagnoses:  1. Pain in right finger(s)   2. S/P hip replacement, left   3. Pain in left ankle and joints of left foot     Plan: Impression is #1 status post left Hemi hip arthroplasty, #2 left ankle posterior malleolus fracture, #3 right index finger osteoarthritis.  In regards to the left hip, she is doing well.  Continue with physical therapy.  Continue to advance activity as tolerated.  In regards to the left ankle, we have transitioned her out of the cam walker and into an ASO brace.  She will begin range of motion and strengthening exercises with therapy.  In regards to the finger, she will continue applying topical NSAIDs as needed.  Follow-up with [place] in 6 weeks time for repeat evaluation and AP pelvis x-rays in addition to three-view x-rays of the left ankle.  Call with concerns or questions.  Follow-Up Instructions: No follow-ups on file.   Orders:  Orders Placed This Encounter  Procedures   XR Pelvis 1-2 Views   XR Ankle Complete Left   XR Finger Index Right   No orders of the defined types were placed in this encounter.     Procedures: No procedures performed   Clinical Data: No additional findings.   Subjective: Chief Complaint  Patient presents with   Left Ankle - Fracture, Follow-up   Left Hip - Routine Post Op    HPI patient is a pleasant 80 year old female who comes in today 6 weeks out left hip hemiarthroplasty date of surgery 08/13/2020 in addition to left ankle posterior malleolus fracture for which we have treated conservatively.  She is also complaining of right index finger pain for the past 3 weeks.  In regards to the left hip, she is doing well.  She still notes some discomfort but  nothing severe.  She has been getting physical therapy.  Ambulating with a walker.  In regards to her ankle, she has been compliant ambulating in a cam boot.  No pain to the left ankle.  In regards to the finger, she noticed pain and swelling about 3 weeks ago.  No injury or insect bite.  Her husband has been applying anti-inflammatory cream with good relief of symptoms.  Review of Systems as detailed in HPI.  All others reviewed and are negative.   Objective: Vital Signs: There were no vitals taken for this visit.  Physical Exam well-developed well-nourished female in no acute distress.  Alert and oriented x3.  Ortho Exam left hip exam shows a painless logroll.  Slightly limited hip flexion.  Left ankle shows no tenderness.  Full range of motion without pain.  No swelling.  She is neurovascular intact distally.  Right index finger reveals mild swelling.  Minimal erythema.  Mild tenderness to the PIP joint.  She is neurovascular intact distally.  Specialty Comments:  No specialty comments available.  Imaging: No results found.   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 01/15/2020   Anticoagulant long-term use 01/15/2020   Other secondary pulmonary hypertension ([place]) 10/28/2019   Acute on chronic combined systolic and diastolic CHF (congestive heart failure) ([place])    Anasarca 04/25/2019   Hypoalbuminemia 04/25/2019   Persistent atrial fibrillation ([place]) 04/25/2019   CKD (chronic kidney disease), stage III ([place]) 04/25/2019   Anemia due to chronic kidney disease 04/25/2019   Pressure injury of skin 04/24/2019   Fall 04/23/2019   S/P right knee arthroscopy 04/13/2015   OSA (obstructive sleep apnea) 01/01/2013    Diabetes ([place]) 10/23/2012   S/P aortic valve replacement 06/30/2010   Hyperlipidemia 09/29/2008   Obesity 09/29/2008   Essential hypertension 09/29/2008   DEGENERATIVE JOINT DISEASE 09/29/2008   Past Medical History:  Diagnosis Date   Anxiety    Diabetes mellitus with neuropathy (HCC)    Diabetic neuropathy (HCC)    DJD (degenerative joint disease)    H/O aortic valve replacement    Hyperlipidemia    Hypertension    Neuromuscular disorder ([place])    neuropathy in feet   Obesity    Sleep apnea    Stroke ([place])    " light stroke"   Wears glasses     Family History  Problem Relation Age of Onset   Pneumonia Sister    Dementia Mother    Diabetes Sister     Past Surgical History:  Procedure Laterality Date   ANKLE SURGERY     Left tendon repair   ANTERIOR APPROACH HEMI HIP ARTHROPLASTY Left 08/13/2020   Procedure: ANTERIOR APPROACH HEMI HIP ARTHROPLASTY;  Surgeon: Leandrew Koyanagi, MD;  Location: [place];  Service: Orthopedics;  Laterality: Left;   AORTIC VALVE REPLACEMENT  03/1994   CARDIAC CATHETERIZATION     1996 [facility]   CERVICAL SPINE SURGERY     COLONOSCOPY     HEMORRHOID SURGERY     IR PARACENTESIS  10/31/2019   KNEE ARTHROSCOPY Right 04/13/2015   Procedure: RIGHT KNEE ARTHROSCOPY WITH DEBRIDEMENT AND PARTIAL MEDIAL MENISCECTOMY;  Surgeon: Mcarthur Rossetti, MD;  Location: [place];  Service: Orthopedics;  Laterality: Right;   KNEE ARTHROSCOPY W/ MENISCECTOMY Right 04/13/2015   LUMBAR FUSION     RIGHT HEART CATH N/A 10/28/2019   Procedure: RIGHT HEART CATH;  Surgeon: Leonie Man, MD;  Location: [place] [facility];  Service: Cardiovascular;  Laterality: N/A;   Social History   Occupational History   Occupation: Disabled    Employer: RETIRED  Tobacco Use   Smoking status: Never   Smokeless tobacco: Never  Vaping Use   Vaping Use: Never used  Substance and Sexual Activity   Alcohol use: No    Alcohol/week: 0.0 standard drinks   Drug use: No   Sexual  activity: Not on file

## 2020-10-27 ENCOUNTER — Other Ambulatory Visit (HOSPITAL_COMMUNITY): Payer: Self-pay | Admitting: Nephrology

## 2020-10-27 DIAGNOSIS — E871 Hypo-osmolality and hyponatremia: Secondary | ICD-10-CM

## 2020-10-27 DIAGNOSIS — D638 Anemia in other chronic diseases classified elsewhere: Secondary | ICD-10-CM

## 2020-10-27 DIAGNOSIS — E1122 Type 2 diabetes mellitus with diabetic chronic kidney disease: Secondary | ICD-10-CM

## 2020-10-27 DIAGNOSIS — I129 Hypertensive chronic kidney disease with stage 1 through stage 4 chronic kidney disease, or unspecified chronic kidney disease: Secondary | ICD-10-CM

## 2020-11-03 ENCOUNTER — Other Ambulatory Visit: Payer: Self-pay

## 2020-11-03 ENCOUNTER — Ambulatory Visit (HOSPITAL_COMMUNITY)
Admission: RE | Admit: 2020-11-03 | Discharge: 2020-11-03 | Disposition: A | Discharge status: Home / Self Care | Payer: Medicare Other | Source: Ambulatory Visit | Attending: Nephrology | Admitting: Nephrology

## 2020-11-03 DIAGNOSIS — E871 Hypo-osmolality and hyponatremia: Secondary | ICD-10-CM | POA: Diagnosis present

## 2020-11-03 DIAGNOSIS — D638 Anemia in other chronic diseases classified elsewhere: Secondary | ICD-10-CM | POA: Diagnosis present

## 2020-11-03 DIAGNOSIS — E1122 Type 2 diabetes mellitus with diabetic chronic kidney disease: Secondary | ICD-10-CM | POA: Diagnosis not present

## 2020-11-03 DIAGNOSIS — I129 Hypertensive chronic kidney disease with stage 1 through stage 4 chronic kidney disease, or unspecified chronic kidney disease: Secondary | ICD-10-CM | POA: Diagnosis present

## 2020-11-09 ENCOUNTER — Ambulatory Visit: Payer: Medicare Other | Admitting: Orthopaedic Surgery

## 2020-11-23 ENCOUNTER — Other Ambulatory Visit (HOSPITAL_COMMUNITY): Payer: Self-pay | Admitting: Nephrology

## 2020-11-23 DIAGNOSIS — R93421 Abnormal radiologic findings on diagnostic imaging of right kidney: Secondary | ICD-10-CM

## 2020-12-01 ENCOUNTER — Encounter (HOSPITAL_COMMUNITY): Payer: Self-pay

## 2020-12-01 ENCOUNTER — Other Ambulatory Visit: Payer: Self-pay

## 2020-12-01 ENCOUNTER — Encounter (HOSPITAL_COMMUNITY)
Admission: RE | Admit: 2020-12-01 | Discharge: 2020-12-01 | Disposition: A | Discharge status: Home / Self Care | Payer: Medicare Other | Source: Ambulatory Visit | Attending: Nephrology | Admitting: Nephrology

## 2020-12-01 DIAGNOSIS — R93421 Abnormal radiologic findings on diagnostic imaging of right kidney: Secondary | ICD-10-CM

## 2020-12-01 MED ORDER — TECHNETIUM TC 99M MERTIATIDE
5.0000 | Freq: Once | INTRAVENOUS | Status: AC | PRN
  Administered 2020-12-01: 5.5 via INTRAVENOUS

## 2020-12-01 MED ORDER — FUROSEMIDE 10 MG/ML IJ SOLN: 26.0000 mg | Freq: Once | INTRAMUSCULAR | Status: AC

## 2020-12-01 MED ORDER — FUROSEMIDE 10 MG/ML IJ SOLN
INTRAMUSCULAR | Status: AC
  Administered 2020-12-01: 26 mg via INTRAVENOUS
  Filled 2020-12-01: qty 4

## 2020-12-18 ENCOUNTER — Emergency Department (HOSPITAL_COMMUNITY): Payer: Medicare Other

## 2020-12-18 ENCOUNTER — Observation Stay (HOSPITAL_COMMUNITY)
Admission: EM | Admit: 2020-12-18 | Discharge: 2020-12-21 | DRG: 536 | Disposition: A | Discharge status: Skilled Nursing Facility | Payer: Medicare Other | Source: Skilled Nursing Facility | Attending: Internal Medicine | Admitting: Internal Medicine

## 2020-12-18 ENCOUNTER — Other Ambulatory Visit: Payer: Self-pay

## 2020-12-18 DIAGNOSIS — G4730 Sleep apnea, unspecified: Secondary | ICD-10-CM | POA: Diagnosis present

## 2020-12-18 DIAGNOSIS — Z96642 Presence of left artificial hip joint: Secondary | ICD-10-CM | POA: Diagnosis not present

## 2020-12-18 DIAGNOSIS — S3282XA Multiple fractures of pelvis without disruption of pelvic ring, initial encounter for closed fracture: Secondary | ICD-10-CM | POA: Diagnosis not present

## 2020-12-18 DIAGNOSIS — N1832 Chronic kidney disease, stage 3b: Secondary | ICD-10-CM | POA: Diagnosis present

## 2020-12-18 DIAGNOSIS — Z952 Presence of prosthetic heart valve: Secondary | ICD-10-CM

## 2020-12-18 DIAGNOSIS — Z833 Family history of diabetes mellitus: Secondary | ICD-10-CM

## 2020-12-18 DIAGNOSIS — Z7901 Long term (current) use of anticoagulants: Secondary | ICD-10-CM

## 2020-12-18 DIAGNOSIS — Z8673 Personal history of transient ischemic attack (TIA), and cerebral infarction without residual deficits: Secondary | ICD-10-CM

## 2020-12-18 DIAGNOSIS — S329XXA Fracture of unspecified parts of lumbosacral spine and pelvis, initial encounter for closed fracture: Secondary | ICD-10-CM | POA: Diagnosis present

## 2020-12-18 DIAGNOSIS — K219 Gastro-esophageal reflux disease without esophagitis: Secondary | ICD-10-CM | POA: Diagnosis not present

## 2020-12-18 DIAGNOSIS — F0394 Unspecified dementia, unspecified severity, with anxiety: Secondary | ICD-10-CM | POA: Diagnosis not present

## 2020-12-18 DIAGNOSIS — I130 Hypertensive heart and chronic kidney disease with heart failure and stage 1 through stage 4 chronic kidney disease, or unspecified chronic kidney disease: Secondary | ICD-10-CM | POA: Diagnosis present

## 2020-12-18 DIAGNOSIS — W010XXA Fall on same level from slipping, tripping and stumbling without subsequent striking against object, initial encounter: Secondary | ICD-10-CM | POA: Diagnosis not present

## 2020-12-18 DIAGNOSIS — M1611 Unilateral primary osteoarthritis, right hip: Secondary | ICD-10-CM | POA: Diagnosis present

## 2020-12-18 DIAGNOSIS — Z79899 Other long term (current) drug therapy: Secondary | ICD-10-CM | POA: Diagnosis not present

## 2020-12-18 DIAGNOSIS — E785 Hyperlipidemia, unspecified: Secondary | ICD-10-CM | POA: Diagnosis present

## 2020-12-18 DIAGNOSIS — E1140 Type 2 diabetes mellitus with diabetic neuropathy, unspecified: Secondary | ICD-10-CM | POA: Diagnosis present

## 2020-12-18 DIAGNOSIS — Z20822 Contact with and (suspected) exposure to covid-19: Secondary | ICD-10-CM | POA: Diagnosis not present

## 2020-12-18 DIAGNOSIS — G4700 Insomnia, unspecified: Secondary | ICD-10-CM | POA: Diagnosis not present

## 2020-12-18 DIAGNOSIS — D631 Anemia in chronic kidney disease: Secondary | ICD-10-CM | POA: Diagnosis not present

## 2020-12-18 DIAGNOSIS — E1122 Type 2 diabetes mellitus with diabetic chronic kidney disease: Secondary | ICD-10-CM | POA: Diagnosis present

## 2020-12-18 DIAGNOSIS — E1169 Type 2 diabetes mellitus with other specified complication: Secondary | ICD-10-CM

## 2020-12-18 DIAGNOSIS — Z91048 Other nonmedicinal substance allergy status: Secondary | ICD-10-CM

## 2020-12-18 DIAGNOSIS — W19XXXA Unspecified fall, initial encounter: Secondary | ICD-10-CM

## 2020-12-18 DIAGNOSIS — K5909 Other constipation: Secondary | ICD-10-CM | POA: Diagnosis not present

## 2020-12-18 DIAGNOSIS — Z981 Arthrodesis status: Secondary | ICD-10-CM

## 2020-12-18 DIAGNOSIS — S32810A Multiple fractures of pelvis with stable disruption of pelvic ring, initial encounter for closed fracture: Secondary | ICD-10-CM | POA: Diagnosis not present

## 2020-12-18 DIAGNOSIS — Y92099 Unspecified place in other non-institutional residence as the place of occurrence of the external cause: Secondary | ICD-10-CM

## 2020-12-18 DIAGNOSIS — I10 Essential (primary) hypertension: Secondary | ICD-10-CM | POA: Diagnosis not present

## 2020-12-18 DIAGNOSIS — S32591A Other specified fracture of right pubis, initial encounter for closed fracture: Principal | ICD-10-CM | POA: Diagnosis present

## 2020-12-18 DIAGNOSIS — I5032 Chronic diastolic (congestive) heart failure: Secondary | ICD-10-CM | POA: Diagnosis not present

## 2020-12-18 LAB — CBC WITH DIFFERENTIAL/PLATELET
Abs Immature Granulocytes: 0.06 10*3/uL (ref 0.00–0.07)
Basophils Absolute: 0 10*3/uL (ref 0.0–0.1)
Basophils Relative: 1 %
Eosinophils Absolute: 1 10*3/uL — ABNORMAL HIGH (ref 0.0–0.5)
Eosinophils Relative: 17 %
HCT: 32.1 % — ABNORMAL LOW (ref 36.0–46.0)
Hemoglobin: 10.1 g/dL — ABNORMAL LOW (ref 12.0–15.0)
Immature Granulocytes: 1 %
Lymphocytes Relative: 11 %
Lymphs Abs: 0.6 10*3/uL — ABNORMAL LOW (ref 0.7–4.0)
MCH: 33.9 pg (ref 26.0–34.0)
MCHC: 31.5 g/dL (ref 30.0–36.0)
MCV: 107.7 fL — ABNORMAL HIGH (ref 80.0–100.0)
Monocytes Absolute: 0.5 10*3/uL (ref 0.1–1.0)
Monocytes Relative: 8 %
Neutro Abs: 3.7 10*3/uL (ref 1.7–7.7)
Neutrophils Relative %: 62 %
Platelets: 137 10*3/uL — ABNORMAL LOW (ref 150–400)
RBC: 2.98 MIL/uL — ABNORMAL LOW (ref 3.87–5.11)
RDW: 14.6 % (ref 11.5–15.5)
WBC: 5.9 10*3/uL (ref 4.0–10.5)
nRBC: 0 % (ref 0.0–0.2)

## 2020-12-18 LAB — TYPE AND SCREEN
ABO/RH(D): A POS
Antibody Screen: NEGATIVE

## 2020-12-18 LAB — BASIC METABOLIC PANEL
Anion gap: 8 (ref 5–15)
BUN: 21 mg/dL (ref 8–23)
CO2: 26 mmol/L (ref 22–32)
Calcium: 8.7 mg/dL — ABNORMAL LOW (ref 8.9–10.3)
Chloride: 102 mmol/L (ref 98–111)
Creatinine, Ser: 1.26 mg/dL — ABNORMAL HIGH (ref 0.44–1.00)
GFR, Estimated: 43 mL/min — ABNORMAL LOW (ref >60)
Glucose, Bld: 96 mg/dL (ref 70–99)
Potassium: 3.7 mmol/L (ref 3.5–5.1)
Sodium: 136 mmol/L (ref 135–145)

## 2020-12-18 LAB — MAGNESIUM: Magnesium: 2.1 mg/dL (ref 1.7–2.4)

## 2020-12-18 LAB — PHOSPHORUS: Phosphorus: 2.9 mg/dL (ref 2.5–4.6)

## 2020-12-18 LAB — RESP PANEL BY RT-PCR (FLU A&B, COVID) ARPGX2
Influenza A by PCR: NEGATIVE
Influenza B by PCR: NEGATIVE
SARS Coronavirus 2 by RT PCR: NEGATIVE

## 2020-12-18 LAB — PROTIME-INR
INR: 3.6 — ABNORMAL HIGH (ref 0.8–1.2)
Prothrombin Time: 36 seconds — ABNORMAL HIGH (ref 11.4–15.2)

## 2020-12-18 MED ORDER — ONDANSETRON HCL 4 MG PO TABS: 4.0000 mg | ORAL_TABLET | Freq: Four times a day (QID) | ORAL | Status: DC | PRN

## 2020-12-18 MED ORDER — FLUTICASONE PROPIONATE 50 MCG/ACT NA SUSP: 1.0000 | Freq: Every day | NASAL | Status: DC | PRN

## 2020-12-18 MED ORDER — DOCUSATE SODIUM 100 MG PO CAPS
100.0000 mg | ORAL_CAPSULE | Freq: Two times a day (BID) | ORAL | Status: DC
  Administered 2020-12-18 – 2020-12-21 (×6): 100 mg via ORAL
  Filled 2020-12-18 (×6): qty 1

## 2020-12-18 MED ORDER — PANTOPRAZOLE SODIUM 40 MG PO TBEC
40.0000 mg | DELAYED_RELEASE_TABLET | Freq: Every day | ORAL | Status: DC
Start: 2020-12-19 — End: 2020-12-21
  Administered 2020-12-19 – 2020-12-21 (×3): 40 mg via ORAL
  Filled 2020-12-18 (×3): qty 1

## 2020-12-18 MED ORDER — FENTANYL CITRATE PF 50 MCG/ML IJ SOSY
50.0000 ug | PREFILLED_SYRINGE | INTRAMUSCULAR | Status: DC | PRN
Start: 2020-12-18 — End: 2020-12-19
  Administered 2020-12-18: 50 ug via INTRAVENOUS
  Filled 2020-12-18: qty 1

## 2020-12-18 MED ORDER — ONDANSETRON HCL 4 MG/2ML IJ SOLN: 4.0000 mg | Freq: Four times a day (QID) | INTRAMUSCULAR | Status: DC | PRN

## 2020-12-18 MED ORDER — METHOCARBAMOL 500 MG PO TABS: 500.0000 mg | ORAL_TABLET | Freq: Four times a day (QID) | ORAL | Status: DC | PRN

## 2020-12-18 MED ORDER — TORSEMIDE 20 MG PO TABS
20.0000 mg | ORAL_TABLET | Freq: Every day | ORAL | Status: DC
  Administered 2020-12-18 – 2020-12-21 (×4): 20 mg via ORAL
  Filled 2020-12-18 (×4): qty 1

## 2020-12-18 MED ORDER — SODIUM CHLORIDE 0.9 % IV SOLN: INTRAVENOUS | Status: DC

## 2020-12-18 MED ORDER — QUETIAPINE FUMARATE 25 MG PO TABS
25.0000 mg | ORAL_TABLET | Freq: Every day | ORAL | Status: DC
  Administered 2020-12-19 – 2020-12-20 (×2): 25 mg via ORAL
  Filled 2020-12-18 (×2): qty 1

## 2020-12-18 MED ORDER — BISACODYL 10 MG RE SUPP: 10.0000 mg | Freq: Every day | RECTAL | Status: DC | PRN

## 2020-12-18 MED ORDER — ACETAMINOPHEN 325 MG PO TABS: 650.0000 mg | ORAL_TABLET | Freq: Four times a day (QID) | ORAL | Status: DC | PRN

## 2020-12-18 MED ORDER — HYDROCODONE-ACETAMINOPHEN 5-325 MG PO TABS
1.0000 | ORAL_TABLET | Freq: Four times a day (QID) | ORAL | Status: DC | PRN
Start: 2020-12-18 — End: 2020-12-21
  Administered 2020-12-18 (×2): 1 via ORAL
  Administered 2020-12-19 – 2020-12-20 (×4): 2 via ORAL
  Filled 2020-12-18: qty 2
  Filled 2020-12-18: qty 1
  Filled 2020-12-18: qty 2
  Filled 2020-12-18: qty 1
  Filled 2020-12-18 (×2): qty 2

## 2020-12-18 MED ORDER — FENTANYL CITRATE PF 50 MCG/ML IJ SOSY
50.0000 ug | PREFILLED_SYRINGE | Freq: Once | INTRAMUSCULAR | Status: AC
  Administered 2020-12-18: 50 ug via INTRAVENOUS
  Filled 2020-12-18: qty 1

## 2020-12-18 MED ORDER — BUPROPION HCL ER (SR) 150 MG PO TB12
150.0000 mg | ORAL_TABLET | Freq: Every day | ORAL | Status: DC
  Administered 2020-12-19 – 2020-12-21 (×3): 150 mg via ORAL
  Filled 2020-12-18 (×3): qty 1

## 2020-12-18 MED ORDER — MORPHINE SULFATE (PF) 2 MG/ML IV SOLN
1.0000 mg | Freq: Four times a day (QID) | INTRAVENOUS | Status: DC | PRN
  Administered 2020-12-19: 2 mg via INTRAVENOUS
  Filled 2020-12-18: qty 1

## 2020-12-18 MED ORDER — BUSPIRONE HCL 5 MG PO TABS
5.0000 mg | ORAL_TABLET | Freq: Two times a day (BID) | ORAL | Status: DC
  Administered 2020-12-18 – 2020-12-21 (×6): 5 mg via ORAL
  Filled 2020-12-18 (×6): qty 1

## 2020-12-18 MED ORDER — OXYCODONE HCL 5 MG PO TABS: 5.0000 mg | ORAL_TABLET | Freq: Four times a day (QID) | ORAL | Status: DC | PRN

## 2020-12-18 MED ORDER — POLYETHYLENE GLYCOL 3350 17 G PO PACK
17.0000 g | PACK | Freq: Every day | ORAL | Status: DC
  Administered 2020-12-18 – 2020-12-21 (×4): 17 g via ORAL
  Filled 2020-12-18 (×4): qty 1

## 2020-12-18 MED ORDER — LACTULOSE 10 GM/15ML PO SOLN
20.0000 g | Freq: Every day | ORAL | Status: DC
  Administered 2020-12-18 – 2020-12-21 (×4): 20 g via ORAL
  Filled 2020-12-18 (×4): qty 30

## 2020-12-18 MED ORDER — SODIUM CHLORIDE 0.9 % IV SOLN: INTRAVENOUS | Status: AC

## 2020-12-18 NOTE — ED Notes (Signed)
Admitting at bedside with patient and husband. Patient repositioned in bed and given meal tray.

## 2020-12-18 NOTE — Progress Notes (Signed)
ANTICOAGULATION CONSULT NOTE - Initial Consult  Pharmacy Consult for warfarin Indication: mechanical heart valve, Afib  Allergies  Allergen Reactions   Tape Itching    Redness, Please use "paper" tape only    Patient Measurements: Height: '5\' 7"'$  (170.2 cm) Weight: 54.4 kg (120 lb) IBW/kg (Calculated) : 61.6 Heparin Dosing Weight:   Vital Signs: Temp: 98.2 F (36.8 C) (10/01 0801) Temp Source: Oral (10/01 0801) BP: 128/63 (10/01 1130) Pulse Rate: 106 (10/01 1130)  Labs: Recent Labs    12/18/20 0816  HGB 10.1*  HCT 32.1*  PLT 137*  LABPROT 36.0*  INR 3.6*  CREATININE 1.26*    Estimated Creatinine Clearance: 29.6 mL/min (A) (by C-G formula based on SCr of 1.26 mg/dL (H)).   Medical History: Past Medical History:  Diagnosis Date   Anxiety    Diabetes mellitus with neuropathy (HCC)    Diabetic neuropathy (HCC)    DJD (degenerative joint disease)    H/O aortic valve replacement    Hyperlipidemia    Hypertension    Neuromuscular disorder ([place])    neuropathy in feet   Obesity    Sleep apnea    Stroke ([place])    " light stroke"   Wears glasses     Medications:  (Not in a hospital admission)   Assessment: Pharmacy consulted to dose warfarin in patient with mechanical AVR and Afib.  Patient's home dose listed as 3 mg daily and INR on admission 3.6.  Hgb 10.1   Goal of Therapy:  INR goal 2.5-3.5 Monitor platelets by anticoagulation protocol: Yes   Plan:  Hold warfarin x 1 dose. Monitor daily INR and s/s of bleeding.  Margot Ables, PharmD Clinical Pharmacist 12/18/2020 12:20 PM

## 2020-12-18 NOTE — ED Notes (Signed)
Patient has skin tear to right elbow. Wet gauze applied for moisture.

## 2020-12-18 NOTE — ED Notes (Signed)
Attempted report x 1. States room is currently being cleaned.

## 2020-12-18 NOTE — ED Notes (Signed)
Attempted report 

## 2020-12-18 NOTE — ED Provider Notes (Signed)
[place] Provider Note  CSN: [id number] Arrival date & time: 12/18/20 0749    History Chief Complaint  Patient presents with  . Fall    Mary [name] is a 80 y.o. female brought to the ED via EMS from SNF after a fall this morning, she states she stumbled and fell trying to get her walker and landed on her R side. Hit her head on the way down. Complaining of R elbow and R hip pain. She denies LOC. She is on coumadin, she thinks for a bad heart valve.    Past Medical History:  Diagnosis Date  . Anxiety   . Diabetes mellitus with neuropathy ([place])   . Diabetic neuropathy ([place])   . DJD (degenerative joint disease)   . H/O aortic valve replacement   . Hyperlipidemia   . Hypertension   . Neuromuscular disorder (HCC)    neuropathy in feet  . Obesity   . Sleep apnea   . Stroke [facility])    " light stroke"  . Wears glasses     Past Surgical History:  Procedure Laterality Date  . ANKLE SURGERY     Left tendon repair  . ANTERIOR APPROACH HEMI HIP ARTHROPLASTY Left 08/13/2020   Procedure: ANTERIOR APPROACH HEMI HIP ARTHROPLASTY;  Surgeon: Leandrew Koyanagi, MD;  Location: [place];  Service: Orthopedics;  Laterality: Left;  . AORTIC VALVE REPLACEMENT  03/1994  . CARDIAC CATHETERIZATION     1996 [facility]  . CERVICAL SPINE SURGERY    . COLONOSCOPY    . HEMORRHOID SURGERY    . IR PARACENTESIS  10/31/2019  . KNEE ARTHROSCOPY Right 04/13/2015   Procedure: RIGHT KNEE ARTHROSCOPY WITH DEBRIDEMENT AND PARTIAL MEDIAL MENISCECTOMY;  Surgeon: Mcarthur Rossetti, MD;  Location: [place];  Service: Orthopedics;  Laterality: Right;  . KNEE ARTHROSCOPY W/ MENISCECTOMY Right 04/13/2015  . LUMBAR FUSION    . RIGHT HEART CATH N/A 10/28/2019   Procedure: RIGHT HEART CATH;  Surgeon: Leonie Man, MD;  Location: [place] [facility];  Service: Cardiovascular;  Laterality: N/A;    Family History  Problem Relation Age of Onset  . Pneumonia Sister   . Dementia Mother   . Diabetes  Sister     Social History   Tobacco Use  . Smoking status: Never  . Smokeless tobacco: Never  Vaping Use  . Vaping Use: Never used  Substance Use Topics  . Alcohol use: No    Alcohol/week: 0.0 standard drinks  . Drug use: No     Home Medications Prior to Admission medications   Medication Sig Start Date End Date Taking? Authorizing Provider  acetaminophen (TYLENOL) 500 MG tablet Take 500 mg by mouth 2 (two) times daily as needed for mild pain or moderate pain.     [provider]  bisacodyl (DULCOLAX) 5 MG EC tablet Take 5 mg by mouth daily as needed for moderate constipation.    [provider]  buPROPion (WELLBUTRIN SR) 150 MG 12 hr tablet Take 150 mg by mouth daily. 01/07/20   [provider]  busPIRone (BUSPAR) 5 MG tablet Take 5 mg by mouth 2 (two) times daily.    [provider]  cetirizine (ZYRTEC) 10 MG tablet Take 10 mg by mouth at bedtime as needed for allergies.  02/16/15   [provider]  docusate sodium (COLACE) 100 MG capsule Take 1 capsule (100 mg total) by mouth 2 (two) times daily. 08/19/20   Rai, Ripudeep K,  MD  fluticasone (FLONASE) 50 MCG/ACT nasal spray Place 1 spray into both nostrils as needed.  01/13/15   [provider]  lactulose (CHRONULAC) 10 GM/15ML solution Take 30 mLs (20 g total) by mouth daily. 11/03/19   Donne Hazel, MD  methocarbamol (ROBAXIN) 500 MG tablet Take 1 tablet (500 mg total) by mouth every 6 (six) hours as needed for muscle spasms. 08/19/20   Rai, Vernelle Emerald, MD  oxyCODONE-acetaminophen (PERCOCET) 5-325 MG tablet Take 1-2 tablets by mouth every 8 (eight) hours as needed for severe pain. 08/13/20   Leandrew Koyanagi, MD  pantoprazole (PROTONIX) 40 MG tablet Take 1 tablet (40 mg total) by mouth daily before breakfast. 03/21/16   Rehman, Mechele Dawley, MD  torsemide (DEMADEX) 20 MG tablet Take 20 mg by mouth daily.    [provider]  warfarin (COUMADIN) 3 MG tablet Take 1 tablet (3 mg total)  by mouth daily. 08/19/20   Rai, Vernelle Emerald, MD     Allergies    Tape   Review of Systems   Review of Systems A comprehensive review of systems was completed and negative except as noted in HPI.    Physical Exam BP 139/69 (BP Location: Left Arm)   Pulse (!) 105   Temp 98.2 F (36.8 C) (Oral)   Resp (!) 23   Ht '5\' 7"'$  (1.702 m)   Wt 54.4 kg   SpO2 93%   BMI 18.79 kg/m   Physical Exam Vitals and nursing note reviewed.  Constitutional:      Appearance: Normal appearance.  HENT:     Head: Normocephalic and atraumatic.     Nose: Nose normal.     Mouth/Throat:     Mouth: Mucous membranes are moist.  Eyes:     Extraocular Movements: Extraocular movements intact.     Conjunctiva/sclera: Conjunctivae normal.  Cardiovascular:     Rate and Rhythm: Normal rate.     Comments: Valve click Pulmonary:     Effort: Pulmonary effort is normal.     Breath sounds: Normal breath sounds.  Abdominal:     General: Abdomen is flat.     Palpations: Abdomen is soft.     Tenderness: There is no abdominal tenderness.  Musculoskeletal:        General: Tenderness (R elbow (skin tear) and R hip; some shortening of R hip, pain with ROM) present. No swelling. Normal range of motion.     Cervical back: Neck supple. No tenderness.  Skin:    General: Skin is warm and dry.  Neurological:     General: No focal deficit present.     Mental Status: She is alert.  Psychiatric:        Mood and Affect: Mood normal.     ED Results / Procedures / Treatments   Labs (all labs ordered are listed, but only abnormal results are displayed) Labs Reviewed  BASIC METABOLIC PANEL  CBC WITH DIFFERENTIAL/PLATELET  PROTIME-INR  TYPE AND SCREEN    EKG None  Radiology No results found.  Procedures Procedures  Medications Ordered in the ED Medications  0.9 %  sodium chloride infusion (has no administration in time range)  fentaNYL (SUBLIMAZE) injection 50 mcg (has no administration in time range)      MDM Rules/Calculators/A&P MDM Patient with fall, suspected hip fracture also on coumadin and hit her head. Will initiate Hip fx order set including head CT. Pain meds for comfort.  ED Course  I have reviewed the triage vital  signs and the nursing notes.  Pertinent labs & imaging results that were available during my care of the patient were reviewed by me and considered in my medical decision making (see chart for details).  Clinical Course as of 12/18/20 1502  Sat Dec 18, 2020  0904 CBC with mild anemia, no leukocytosis.  [CS]  [id number] BMP with improved Cr from baseline.  [CS]  0916 INR is 3.6, slightly supratherapeutic.  [CS]  0952 Hip xray neg for fracture but still having considerable pain with ROM. Will send for CT to eval occult fx.  [CS]  [id number] CT shows nondisplaced pelvis fractures, patient living at ALF, her facility does not have skilled nursing. Will discuss with Hospitalist.   [CS]    Clinical Course User Index [CS] Truddie Hidden, MD    Final Clinical Impression(s) / ED Diagnoses Final diagnoses:  None    Rx / DC Orders ED Discharge Orders     None        Truddie Hidden, MD 12/18/20 1502

## 2020-12-18 NOTE — ED Notes (Signed)
Patient's right elbow cleansed and steri stripped with telfa and kling applied.

## 2020-12-18 NOTE — ED Triage Notes (Signed)
Patient states she tripped and fell, landing on her right hip. Patient states she hit the back of her posterior head on the way down. Patient states my whole body hurts, specifically lower back and right hip. Possible shortening of right leg. Hx of left hip replacement.

## 2020-12-18 NOTE — H&P (Signed)
History and Physical    Mary [name] [id number] DOB: [birth year] DOA: 12/18/2020  PCP: Bonnita Nasuti, MD   Patient coming from: [facility] assisted living facility/memory unit.  I have personally briefly reviewed patient's old medical records in [place]  Chief Complaint: Mechanical fall with right hip pain.  HPI: Mary [name] is a 80 y.o. female with medical history significant of dementia, hypertension, hyperlipidemia, chronic kidney disease stage IIIb, anxiety/depression, prior history of stroke and mechanical valve replacement; who presented to the hospital secondary to mechanical fall with subsequent right hip pain.  Patient reported no loss of consciousness, dizziness, chest pain, hematuria, shortness of breath, nausea, vomiting, focal weakness or any other complaints.  She expressed tripping with her walker and landing on her right side.  Patient ended up injuring her right elbow and expressed hitting the back of her head.  COVID PCR was negative in the ED; patient is vaccinated against COVID.  ED Course: CT head without acute intracranial process, right elbow demonstrating no fracture and hip images with positive right pubic ramus fracture; no hip fracture.  Patient experiencing inability to bear weight and complaining of significant pain.  Review of Systems: As per HPI otherwise all other systems reviewed and are negative.  Past Medical History:  Diagnosis Date   Anxiety    Diabetes mellitus with neuropathy (HCC)    Diabetic neuropathy (HCC)    DJD (degenerative joint disease)    H/O aortic valve replacement    Hyperlipidemia    Hypertension    Neuromuscular disorder ([place])    neuropathy in feet   Obesity    Sleep apnea    Stroke ([place])    " light stroke"   Wears glasses     Past Surgical History:  Procedure Laterality Date   ANKLE SURGERY     Left tendon repair   ANTERIOR APPROACH HEMI HIP ARTHROPLASTY Left 08/13/2020   Procedure: ANTERIOR APPROACH HEMI HIP  ARTHROPLASTY;  Surgeon: Leandrew Koyanagi, MD;  Location: [facility];  Service: Orthopedics;  Laterality: Left;   AORTIC VALVE REPLACEMENT  03/1994   CARDIAC CATHETERIZATION     1996 [facility]   CERVICAL SPINE SURGERY     COLONOSCOPY     HEMORRHOID SURGERY     IR PARACENTESIS  10/31/2019   KNEE ARTHROSCOPY Right 04/13/2015   Procedure: RIGHT KNEE ARTHROSCOPY WITH DEBRIDEMENT AND PARTIAL MEDIAL MENISCECTOMY;  Surgeon: Mcarthur Rossetti, MD;  Location: [facility];  Service: Orthopedics;  Laterality: Right;   KNEE ARTHROSCOPY W/ MENISCECTOMY Right 04/13/2015   LUMBAR FUSION     RIGHT HEART CATH N/A 10/28/2019   Procedure: RIGHT HEART CATH;  Surgeon: Leonie Man, MD;  Location: [facility];  Service: Cardiovascular;  Laterality: N/A;    Social History  reports that she has never smoked. She has never used smokeless tobacco. She reports that she does not drink alcohol and does not use drugs.  Allergies  Allergen Reactions   Tape Itching    Redness, Please use "paper" tape only    Family History  Problem Relation Age of Onset   Pneumonia Sister    Dementia Mother    Diabetes Sister     Prior to Admission medications   Medication Sig Start Date End Date Taking? Authorizing Provider  acetaminophen (TYLENOL) 500 MG tablet Take 500 mg by mouth 2 (two) times daily as needed for mild pain or moderate pain.     [provider]  bisacodyl (DULCOLAX)  5 MG EC tablet Take 5 mg by mouth daily as needed for moderate constipation.    [provider]  buPROPion (WELLBUTRIN SR) 150 MG 12 hr tablet Take 150 mg by mouth daily. 01/07/20   [provider]  busPIRone (BUSPAR) 5 MG tablet Take 5 mg by mouth 2 (two) times daily.    [provider]  cetirizine (ZYRTEC) 10 MG tablet Take 10 mg by mouth at bedtime as needed for allergies.  02/16/15   [provider]  docusate sodium (COLACE) 100 MG capsule Take 1 capsule (100 mg total) by mouth 2 (two) times  daily. 08/19/20   Rai, Ripudeep K, MD  fluticasone (FLONASE) 50 MCG/ACT nasal spray Place 1 spray into both nostrils as needed.  01/13/15   [provider]  lactulose (CHRONULAC) 10 GM/15ML solution Take 30 mLs (20 g total) by mouth daily. 11/03/19   Donne Hazel, MD  methocarbamol (ROBAXIN) 500 MG tablet Take 1 tablet (500 mg total) by mouth every 6 (six) hours as needed for muscle spasms. 08/19/20   Rai, Vernelle Emerald, MD  oxyCODONE-acetaminophen (PERCOCET) 5-325 MG tablet Take 1-2 tablets by mouth every 8 (eight) hours as needed for severe pain. 08/13/20   Leandrew Koyanagi, MD  pantoprazole (PROTONIX) 40 MG tablet Take 1 tablet (40 mg total) by mouth daily before breakfast. 03/21/16   Rehman, Mechele Dawley, MD  torsemide (DEMADEX) 20 MG tablet Take 20 mg by mouth daily.    [provider]  warfarin (COUMADIN) 3 MG tablet Take 1 tablet (3 mg total) by mouth daily. 08/19/20   Mendel Corning, MD    Physical Exam: Vitals:   12/18/20 0900 12/18/20 0915 12/18/20 0930 12/18/20 1130  BP: 128/67  130/73 128/63  Pulse: (!) 105 (!) 105  (!) 106  Resp: (!) '21 20 16 18  '$ Temp:      TempSrc:      SpO2: 94% 95%  90%  Weight:      Height:        Constitutional: NAD, calm, comfortable while no movement; patient reports pain all over body specifically right hip discomfort. Vitals:   12/18/20 0900 12/18/20 0915 12/18/20 0930 12/18/20 1130  BP: 128/67  130/73 128/63  Pulse: (!) 105 (!) 105  (!) 106  Resp: (!) '21 20 16 18  '$ Temp:      TempSrc:      SpO2: 94% 95%  90%  Weight:      Height:       Eyes: PERRL, lids and conjunctivae normal; no icterus, no nystagmus. ENMT: Mucous membranes are moist. Posterior pharynx clear of any exudate or lesions. Neck: normal, supple, no masses, no thyromegaly, no JVD. Respiratory: clear to auscultation bilaterally, no wheezing, no crackles. Normal respiratory effort. No accessory muscle use.  Cardiovascular: Regular rate and rhythm, no rubs, no gallops, positive  mechanical click appreciated on auscultation.  No JVD Abdomen: no tenderness, no masses palpated. No hepatosplenomegaly. Bowel sounds positive.  Musculoskeletal: No cyanosis or clubbing; patient reports pain in her right lower extremity and hip area.  Right elbow with skin laceration and clean dressings in place. Skin: no petechiae. Neurologic: Cranial nerves grossly intact; no focal deficits. Psychiatric: Normal judgment; mood is a stable.   Labs on Admission: I have personally reviewed following labs and imaging studies  CBC: Recent Labs  Lab 12/18/20 0816  WBC 5.9  NEUTROABS 3.7  HGB 10.1*  HCT 32.1*  MCV 107.7*  PLT 137*  Basic Metabolic Panel: Recent Labs  Lab 12/18/20 0816  NA 136  K 3.7  CL 102  CO2 26  GLUCOSE 96  BUN 21  CREATININE 1.26*  CALCIUM 8.7*    GFR: Estimated Creatinine Clearance: 29.6 mL/min (A) (by C-G formula based on SCr of 1.26 mg/dL (H)).  Liver Function Tests: No results for input(s): AST, ALT, ALKPHOS, BILITOT, PROT, ALBUMIN in the last 168 hours.  Urine analysis:    Component Value Date/Time   COLORURINE STRAW (A) 08/11/2020 1221   APPEARANCEUR CLEAR 08/11/2020 1221   LABSPEC 1.005 08/11/2020 1221   PHURINE 6.0 08/11/2020 1221   GLUCOSEU NEGATIVE 08/11/2020 1221   [place] 08/11/2020 [place] 08/11/2020 [place] 08/11/2020 1221   PROTEINUR NEGATIVE 08/11/2020 1221   UROBILINOGEN 2.0 (H) 11/01/2007 1518   NITRITE NEGATIVE 08/11/2020 [place] 08/11/2020 1221    Radiological Exams on Admission: DG Elbow Complete Right  Result Date: 12/18/2020 CLINICAL DATA:  Fall.  Skin tear.  Pain. EXAM: RIGHT ELBOW - COMPLETE 3+ VIEW COMPARISON:  None. FINDINGS: The lateral view is limited due to positioning. No fracture identified. IMPRESSION: The study is limited due to positioning the lateral view. Within this limitation, no fracture identified. If there is continued concern,  recommend repeating the lateral view. Electronically Signed   By: Dorise Bullion III M.D.   On: 12/18/2020 09:51   CT HEAD WO CONTRAST  Result Date: 12/18/2020 CLINICAL DATA:  Head trauma, minor EXAM: CT HEAD WITHOUT CONTRAST TECHNIQUE: Contiguous axial images were obtained from the base of the skull through the vertex without intravenous contrast. COMPARISON:  08/11/2020 FINDINGS: Brain: Chronic small vessel ischemia in the hemispheric white matter. Small remote right cerebellar infarcts. No hemorrhage, hydrocephalus, or masslike finding. Generalized brain atrophy. Vascular: No hyperdense vessel or unexpected calcification. Skull: Normal. Negative for fracture or focal lesion. Sinuses/Orbits: No acute finding. Chronic right maxillary sinusitis with calcified debris, partially covered IMPRESSION: Aging brain.  No acute or interval finding. Electronically Signed   By: Jorje Guild M.D.   On: 12/18/2020 09:29   CT Hip Right Wo Contrast  Result Date: 12/18/2020 CLINICAL DATA:  Right hip pain after fall. EXAM: CT OF THE RIGHT HIP WITHOUT CONTRAST TECHNIQUE: Multidetector CT imaging of the right hip was performed according to the standard protocol. Multiplanar CT image reconstructions were also generated. COMPARISON:  Right hip x-rays from same day. CT right hip dated December 08, 2019. FINDINGS: Bones/Joint/Cartilage Acute nondisplaced fracture of the right inferior pubic ramus (series 3, image 85). Acute nondisplaced fracture of the inferior right sacral ala (series 3, image 18). Old healed fracture of the right greater trochanter. Mild right hip osteoarthritis. No joint effusion. Ligaments Ligaments are suboptimally evaluated by CT. Muscles and Tendons Grossly intact. Soft tissue No fluid collection or hematoma.  No soft tissue mass. IMPRESSION: 1. Acute nondisplaced fracture of the right inferior pubic ramus. 2. Acute nondisplaced fracture of the inferior right sacral ala. Electronically Signed   By:  Titus Dubin M.D.   On: 12/18/2020 10:55   DG Chest Port 1 View  Result Date: 12/18/2020 CLINICAL DATA:  Fall.  Suspected hip fracture. EXAM: PORTABLE CHEST 1 VIEW COMPARISON:  Aug 11, 2020 FINDINGS: No pneumothorax. Stable cardiomegaly. The hila and mediastinum are unremarkable. Small right effusion with underlying atelectasis. No other interval changes. IMPRESSION: Cardiomegaly. Small right effusion with underlying atelectasis. No other interval changes. Electronically Signed   By: Dorise Bullion III  M.D.   On: 12/18/2020 09:49   DG Hip Unilat With Pelvis 2-3 Views Right  Result Date: 12/18/2020 CLINICAL DATA:  Fall on Coumadin.  Suspected hip fracture. EXAM: DG HIP (WITH OR WITHOUT PELVIS) 2-3V RIGHT COMPARISON:  None. FINDINGS: There is no evidence of hip fracture or dislocation. There is no evidence of arthropathy or other focal bone abnormality. IMPRESSION: Negative. Electronically Signed   By: Dorise Bullion III M.D.   On: 12/18/2020 09:48    EKG: Independently reviewed.  No acute ischemic changes.  Sinus rhythm and no significant changes since prior tracing.  Assessment/Plan 1-right pubic ramus fracture -Subsequently after experiencing mechanical fall at her assisted living facility. -Complaining of significant pain and difficulty bearing weight. -Patient will be admitted to the hospital for pain management, physical therapy evaluation and involvement of TOC as she might require skilled nursing facility placement for rehabilitation and further care. -Tylenol, hydrocodone, morphine and Robaxin has been ordered to assist with patient pain management. -Orthopedic surgery has been consulted to make sure no surgical intervention is needed and follow any further recommendations.  2-hypertension -Resume home antihypertensive agents -Heart healthy diet has been ordered -Follow vital signs.  3-gastroesophageal flux disease -Continue PPI.  4-history of depression/anxiety -Continue the  use of Wellbutrin and BuSpar  5-insomnia -Continue nightly Seroquel.  6-history of chronic kidney disease a stage IIIb -Appears to be stable and at baseline -Continue to follow renal function trend -Minimize the use of nephrotoxic agents.  7-chronic constipation -Continue the use of MiraLAX, lactulose and Colace. -Maintain adequate hydration.  8-mechanical valve replacement -Continue Coumadin per pharmacy -Goal INR 2.5-3.5. -Currently therapeutic.   DVT prophylaxis: Chronically on Coumadin. Code Status:   Full code Family Communication:  Husband at bedside Disposition Plan:   Patient is from:  Home  Anticipated DC to:  Home  Anticipated DC date:  To be determined  Anticipated DC barriers: Pain control and placement to facilitate care and rehabilitation.  Consults called:  Orthopedic service Admission status:  Inpatient, length of stay more than 2 midnights; MedSurg bed.  Severity of Illness: The appropriate patient status for this patient is INPATIENT. Inpatient status is judged to be reasonable and necessary in order to provide the required intensity of service to ensure the patient's safety. The patient's presenting symptoms, physical exam findings, and initial radiographic and laboratory data in the context of their chronic comorbidities is felt to place them at high risk for further clinical deterioration. Furthermore, it is not anticipated that the patient will be medically stable for discharge from the hospital within 2 midnights of admission. The following factors support the patient status of inpatient.   " The patient's presenting symptoms include right-sided hip pain after mechanical fall.. " The worrisome physical exam findings include difficulty bearing weight and significant pain with movement of her right lower extremity and hip.. " The initial radiographic and laboratory data are worrisome because of stable blood work and therapeutic INR.  Hip images demonstrating  pelvic bone fracture. " The chronic co-morbidities include mechanical valve replacement chronically on anticoagulation; hypertension, hyperlipidemia, chronic kidney disease.   * I certify that at the point of admission it is my clinical judgment that the patient will require inpatient hospital care spanning beyond 2 midnights from the point of admission due to high intensity of service, high risk for further deterioration and high frequency of surveillance required.Barton Dubois MD [facility]  How to contact the [facility] Attending or Consulting provider 7A -  7P or covering provider during after hours [place], for this patient?   Check the care team in [facility] and look for a) attending/consulting TR[facility] provider listed and b) the [facility] team listed Log into [email or web address] and use [place]'s universal password to access. If you do not have the password, please contact the hospital operator. Locate the [facility] provider you are looking for under [facility] and page to a number that you can be directly reached. If you still have difficulty reaching the provider, please page the [facility] (Director on Call) for the Hospitalists listed on amion for assistance.  12/18/2020, 12:19 PM

## 2020-12-18 NOTE — ED Notes (Signed)
Lunch tray ordered for patient.

## 2020-12-19 ENCOUNTER — Encounter (HOSPITAL_COMMUNITY): Payer: Self-pay | Admitting: Internal Medicine

## 2020-12-19 DIAGNOSIS — S3282XA Multiple fractures of pelvis without disruption of pelvic ring, initial encounter for closed fracture: Secondary | ICD-10-CM | POA: Diagnosis not present

## 2020-12-19 DIAGNOSIS — S32810A Multiple fractures of pelvis with stable disruption of pelvic ring, initial encounter for closed fracture: Secondary | ICD-10-CM

## 2020-12-19 DIAGNOSIS — W19XXXA Unspecified fall, initial encounter: Secondary | ICD-10-CM

## 2020-12-19 LAB — CBC
HCT: 27.8 % — ABNORMAL LOW (ref 36.0–46.0)
Hemoglobin: 8.5 g/dL — ABNORMAL LOW (ref 12.0–15.0)
MCH: 32.9 pg (ref 26.0–34.0)
MCHC: 30.6 g/dL (ref 30.0–36.0)
MCV: 107.8 fL — ABNORMAL HIGH (ref 80.0–100.0)
Platelets: 130 10*3/uL — ABNORMAL LOW (ref 150–400)
RBC: 2.58 MIL/uL — ABNORMAL LOW (ref 3.87–5.11)
RDW: 14.7 % (ref 11.5–15.5)
WBC: 5.2 10*3/uL (ref 4.0–10.5)
nRBC: 0 % (ref 0.0–0.2)

## 2020-12-19 LAB — PROTIME-INR
INR: 3.5 — ABNORMAL HIGH (ref 0.8–1.2)
Prothrombin Time: 35.2 seconds — ABNORMAL HIGH (ref 11.4–15.2)

## 2020-12-19 LAB — BASIC METABOLIC PANEL
Anion gap: 7 (ref 5–15)
BUN: 19 mg/dL (ref 8–23)
CO2: 23 mmol/L (ref 22–32)
Calcium: 8.4 mg/dL — ABNORMAL LOW (ref 8.9–10.3)
Chloride: 106 mmol/L (ref 98–111)
Creatinine, Ser: 1.28 mg/dL — ABNORMAL HIGH (ref 0.44–1.00)
GFR, Estimated: 42 mL/min — ABNORMAL LOW (ref >60)
Glucose, Bld: 86 mg/dL (ref 70–99)
Potassium: 3.8 mmol/L (ref 3.5–5.1)
Sodium: 136 mmol/L (ref 135–145)

## 2020-12-19 MED ORDER — WARFARIN SODIUM 2 MG PO TABS
2.0000 mg | ORAL_TABLET | Freq: Once | ORAL | Status: AC
  Administered 2020-12-19: 2 mg via ORAL
  Filled 2020-12-19: qty 1

## 2020-12-19 MED ORDER — WARFARIN - PHARMACIST DOSING INPATIENT: Freq: Every day | Status: DC

## 2020-12-19 NOTE — NC FL2 (Signed)
[place] MEDICAID FL2 LEVEL OF CARE SCREENING TOOL     IDENTIFICATION  Patient Name: Mary [name] Birthdate: 06-11-1940 Sex: female Admission Date (Current Location): 12/18/2020  [facility] and [place] Number:  [employer] and Address:  [facility] [address], [place]      Provider Number: [id number]  Attending Physician Name and Address:  Barton Dubois, MD  Relative Name and Phone Number:  Brinesha, Terzo (Spouse) [phone]    Current Level of Care: Hospital Recommended Level of Care: [place] Prior Approval Number:    Date Approved/Denied:   PASRR Number:    Discharge Plan: SNF    Current Diagnoses: Patient Active Problem List   Diagnosis Date Noted   Pelvis fracture ([place]) 12/18/2020   Malnutrition of moderate degree 08/13/2020   Closed displaced fracture of left femoral neck ([place]) 08/13/2020   Hip fracture ([place]) 08/11/2020   Agitation 02/21/2020   Confusion 02/21/2020   Hyperkalemia 01/15/2020   AKI (acute kidney injury) on CKD 3b 01/15/2020   Anticoagulant long-term use 01/15/2020   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   Acute on chronic combined systolic and diastolic CHF (congestive heart failure) ([place])    Anasarca 04/25/2019   Hypoalbuminemia 04/25/2019   Persistent atrial fibrillation ([place]) 04/25/2019   CKD (chronic kidney disease), stage III ([place]) 04/25/2019   Anemia due to chronic kidney disease 04/25/2019   Pressure injury of skin 04/24/2019   Fall 04/23/2019   S/P right knee arthroscopy 04/13/2015   OSA (obstructive sleep apnea) 01/01/2013   Diabetes ([place]) 10/23/2012   S/P aortic valve replacement 06/30/2010   Hyperlipidemia 09/29/2008   Obesity 09/29/2008   Essential hypertension 09/29/2008   DEGENERATIVE JOINT DISEASE 09/29/2008    Orientation RESPIRATION BLADDER Height & Weight     Self, Time, Situation, Place  O2 (1.5L) Incontinent, External catheter Weight: 120 lb  (54.4 kg) Height:  '5\' 7"'$  (170.2 cm)  BEHAVIORAL SYMPTOMS/MOOD NEUROLOGICAL BOWEL NUTRITION STATUS      Continent Diet  AMBULATORY STATUS COMMUNICATION OF NEEDS Skin   Extensive Assist Verbally Skin abrasions, Other (Comment) (Hematoma)                       Personal Care Assistance Level of Assistance  Bathing, Feeding, Dressing, Total care Bathing Assistance: Limited assistance Feeding assistance: Independent Dressing Assistance: Limited assistance Total Care Assistance: Limited assistance   Functional Limitations Info  Sight, Hearing, Speech Sight Info: Impaired Hearing Info: Adequate Speech Info: Adequate    SPECIAL CARE FACTORS FREQUENCY  PT (By licensed PT), OT (By licensed OT)     PT Frequency: 5 times weekly OT Frequency: 5 times weekly            Contractures Contractures Info: Not present    Additional Factors Info  Code Status, Allergies Code Status Info: FULL Allergies Info: Tape           Current Medications (12/19/2020):  This is the current hospital active medication list Current Facility-Administered Medications  Medication Dose Route Frequency Provider Last Rate Last Admin   0.9 %  sodium chloride infusion   Intravenous Continuous Truddie Hidden, MD 10 mL/hr at 12/18/20 0842 New Bag at 12/18/20 0842   acetaminophen (TYLENOL) tablet 650 mg  650 mg Oral Q6H PRN Barton Dubois, MD       bisacodyl (DULCOLAX) suppository 10 mg  10 mg Rectal Daily PRN Barton Dubois, MD       buPROPion [facility]  SR) 12 hr tablet 150 mg  150 mg Oral Daily Barton Dubois, MD   150 mg at 12/19/20 0816   busPIRone (BUSPAR) tablet 5 mg  5 mg Oral BID Barton Dubois, MD   5 mg at 12/19/20 [id number]   docusate sodium (COLACE) capsule 100 mg  100 mg Oral BID Barton Dubois, MD   100 mg at 12/19/20 0812   fluticasone (FLONASE) 50 MCG/ACT nasal spray 1 spray  1 spray Each Nare Daily PRN Barton Dubois, MD       HYDROcodone-acetaminophen (NORCO/VICODIN) 5-325 MG per tablet 1-2  tablet  1-2 tablet Oral Q6H PRN Barton Dubois, MD   2 tablet at 12/19/20 1130   lactulose ([place]) 10 GM/15ML solution 20 g  20 g Oral Daily Barton Dubois, MD   20 g at 12/19/20 0816   methocarbamol (ROBAXIN) tablet 500 mg  500 mg Oral Q6H PRN Barton Dubois, MD       morphine 2 MG/ML injection 1-2 mg  1-2 mg Intravenous Q6H PRN Barton Dubois, MD   2 mg at 12/19/20 1258   ondansetron (ZOFRAN) tablet 4 mg  4 mg Oral Q6H PRN Barton Dubois, MD       Or   ondansetron [facility]) injection 4 mg  4 mg Intravenous Q6H PRN Barton Dubois, MD       pantoprazole (PROTONIX) EC tablet 40 mg  40 mg Oral QAC breakfast Barton Dubois, MD   40 mg at 12/19/20 [id number]   polyethylene glycol (MIRALAX / GLYCOLAX) packet 17 g  17 g Oral Daily Barton Dubois, MD   17 g at 12/19/20 [id number]   QUEtiapine (SEROQUEL) tablet 25 mg  25 mg Oral QHS Barton Dubois, MD       torsemide [facility]) tablet 20 mg  20 mg Oral Daily Barton Dubois, MD   20 mg at 12/19/20 [id number]   warfarin (COUMADIN) tablet 2 mg  2 mg Oral ONCE-1600 Barton Dubois, MD       Warfarin - Pharmacist Dosing Inpatient   Does not apply [id number] Barton Dubois, MD         Discharge Medications: Please see discharge summary for a list of discharge medications.  Relevant Imaging Results:  Relevant Lab Results:   Additional Information SSN: [id number]  Iona Beard, [place]

## 2020-12-19 NOTE — Consult Note (Signed)
Reason for Consult: Pelvic fracture and sacral ala fracture Referring Physician: Dr. Barton Dubois  Mary [name] is an 80 y.o. female.  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  Patient was transferred there is secondary to her coronary artery disease and the valve along with her requirements for warfarin therapy tolerated that procedure well  [place] again.  She does have some dementia.  She walks with a walker.  She complains of moderate to severe pain in the left hip and pelvis  She is unable to ambulate  Past Medical History:  Diagnosis Date   Anxiety    Diabetes mellitus with neuropathy (HCC)    Diabetic neuropathy (HCC)    DJD (degenerative joint disease)    H/O aortic valve replacement    Hyperlipidemia    Hypertension    Neuromuscular disorder (HCC)    neuropathy in feet   Obesity    Sleep apnea    Stroke ([place])    " light stroke"   Wears glasses     Past Surgical History:  Procedure Laterality Date   ANKLE SURGERY     Left tendon repair   ANTERIOR APPROACH HEMI HIP ARTHROPLASTY Left 08/13/2020   Procedure: ANTERIOR APPROACH HEMI HIP ARTHROPLASTY;  Surgeon: Leandrew Koyanagi, MD;  Location: [facility];  Service: Orthopedics;  Laterality: Left;   AORTIC VALVE REPLACEMENT  03/1994   CARDIAC CATHETERIZATION     1996 [facility]   CERVICAL SPINE SURGERY     COLONOSCOPY     HEMORRHOID SURGERY     IR PARACENTESIS  10/31/2019   KNEE ARTHROSCOPY Right 04/13/2015   Procedure: RIGHT KNEE ARTHROSCOPY WITH DEBRIDEMENT AND PARTIAL MEDIAL MENISCECTOMY;  Surgeon: Mcarthur Rossetti, MD;  Location: [facility];  Service: Orthopedics;  Laterality: Right;   KNEE ARTHROSCOPY W/ MENISCECTOMY Right 04/13/2015   LUMBAR FUSION     RIGHT HEART CATH N/A 10/28/2019   Procedure: RIGHT HEART CATH;  Surgeon: Leonie Man, MD;  Location: [facility];  Service: Cardiovascular;  Laterality: N/A;    Family History  Problem Relation Age of  Onset   Pneumonia Sister    Dementia Mother    Diabetes Sister     Social History:  reports that she has never smoked. She has never used smokeless tobacco. She reports that she does not drink alcohol and does not use drugs.  Allergies:  Allergies  Allergen Reactions   Tape Itching    Redness, Please use "paper" tape only    Medications: I have reviewed the patient's current medications.  Results for orders placed or performed during the hospital encounter of 12/18/20 (from the past 48 hour(s))  Basic metabolic panel     Status: Abnormal   Collection Time: 12/18/20  8:16 AM  Result Value Ref Range   Sodium 136 135 - 145 mmol/L   Potassium 3.7 3.5 - 5.1 mmol/L   Chloride 102 98 - 111 mmol/L   CO2 26 22 - 32 mmol/L   Glucose, Bld 96 70 - 99 mg/dL    Comment: Glucose reference range applies only to samples taken after fasting for at least 8 hours.   BUN 21 8 - 23 mg/dL   Creatinine, Ser 1.26 (H) 0.44 - 1.00 mg/dL   Calcium 8.7 (L) 8.9 - 10.3 mg/dL   GFR, Estimated 43 (L) >60 mL/min    Comment: (NOTE) Calculated using the CKD-EPI Creatinine Equation (2021)    Anion gap 8  5 - 15    Comment: Performed at [facility], [address], [place], [place] [address]  CBC WITH DIFFERENTIAL     Status: Abnormal   Collection Time: 12/18/20  8:16 AM  Result Value Ref Range   WBC 5.9 4.0 - 10.5 K/uL   RBC 2.98 (L) 3.87 - 5.11 MIL/uL   Hemoglobin 10.1 (L) 12.0 - 15.0 g/dL   HCT 32.1 (L) 36.0 - 46.0 %   MCV 107.7 (H) 80.0 - 100.0 fL   MCH 33.9 26.0 - 34.0 pg   MCHC 31.5 30.0 - 36.0 g/dL   RDW 14.6 11.5 - 15.5 %   Platelets 137 (L) 150 - 400 K/uL   nRBC 0.0 0.0 - 0.2 %   Neutrophils Relative % 62 %   Neutro Abs 3.7 1.7 - 7.7 K/uL   Lymphocytes Relative 11 %   Lymphs Abs 0.6 (L) 0.7 - 4.0 K/uL   Monocytes Relative 8 %   Monocytes Absolute 0.5 0.1 - 1.0 K/uL   Eosinophils Relative 17 %   Eosinophils Absolute 1.0 (H) 0.0 - 0.5 K/uL   Basophils Relative 1 %   Basophils Absolute 0.0  0.0 - 0.1 K/uL   Immature Granulocytes 1 %   Abs Immature Granulocytes 0.06 0.00 - 0.07 K/uL    Comment: Performed at [facility], [address] [place], [place] [address]  Protime-INR     Status: Abnormal   Collection Time: 12/18/20  8:16 AM  Result Value Ref Range   Prothrombin Time 36.0 (H) 11.4 - 15.2 seconds   INR 3.6 (H) 0.8 - 1.2    Comment: (NOTE) INR goal varies based on device and disease states. Performed at [facility], [address] [place], [place] [address]   Type and screen [facility]     Status: None   Collection Time: 12/18/20  8:16 AM  Result Value Ref Range   ABO/RH(D) A POS    Antibody Screen NEG    Sample Expiration      12/21/2020,2359 Performed at [facility], [address], [place], [place] [address]   Magnesium     Status: None   Collection Time: 12/18/20  8:16 AM  Result Value Ref Range   Magnesium 2.1 1.7 - 2.4 mg/dL    Comment: Performed at [facility], [address] [place], [place] [address]  Phosphorus     Status: None   Collection Time: 12/18/20  8:16 AM  Result Value Ref Range   Phosphorus 2.9 2.5 - 4.6 mg/dL    Comment: Performed at [facility], [address] [place], [place] [address]  Resp Panel by RT-PCR (Flu A&B, Covid) Nasopharyngeal Swab     Status: None   Collection Time: 12/18/20 12:53 PM   Specimen: Nasopharyngeal Swab; Nasopharyngeal(NP) swabs in vial transport medium  Result Value Ref Range   SARS Coronavirus 2 by RT PCR NEGATIVE NEGATIVE    Comment: (NOTE) SARS-CoV-2 target nucleic acids are NOT DETECTED.  The SARS-CoV-2 RNA is generally detectable in upper respiratory specimens during the acute phase of infection. The lowest concentration of SARS-CoV-2 viral copies this assay can detect is 138 copies/mL. A negative result does not preclude SARS-Cov-2 infection and should not be used as the sole basis for treatment or other patient management decisions. A negative result may occur with  improper specimen  collection/handling, submission of specimen other than nasopharyngeal swab, presence of viral mutation(s) within the areas targeted by this assay, and inadequate number of viral copies(<138  copies/mL). A negative result must be combined with clinical observations, patient history, and epidemiological information. The expected result is Negative.  Fact Sheet for Patients:  EntrepreneurPulse.com.au  Fact Sheet for Healthcare Providers:  IncredibleEmployment.be  This test is no t yet approved or cleared by the [place] FDA and  has been authorized for detection and/or diagnosis of SARS-CoV-2 by FDA under an Emergency Use Authorization (EUA). This EUA will remain  in effect (meaning this test can be used) for the duration of the COVID-19 declaration under Section 564(b)(1) of the Act, 21 U.S.C.section 360bbb-3(b)(1), unless the authorization is terminated  or revoked sooner.       Influenza A by PCR NEGATIVE NEGATIVE   Influenza B by PCR NEGATIVE NEGATIVE    Comment: (NOTE) The Xpert Xpress SARS-CoV-2/FLU/RSV plus assay is intended as an aid in the diagnosis of influenza from Nasopharyngeal swab specimens and should not be used as a sole basis for treatment. Nasal washings and aspirates are unacceptable for Xpert Xpress SARS-CoV-2/FLU/RSV testing.  Fact Sheet for Patients: EntrepreneurPulse.com.au  Fact Sheet for Healthcare Providers: IncredibleEmployment.be  This test is not yet approved or cleared by the [place] FDA and has been authorized for detection and/or diagnosis of SARS-CoV-2 by FDA under an Emergency Use Authorization (EUA). This EUA will remain in effect (meaning this test can be used) for the duration of the COVID-19 declaration under Section 564(b)(1) of the Act, 21 U.S.C. section 360bbb-3(b)(1), unless the authorization is terminated or revoked.  Performed at [facility],  [address] [place], [place] [address]   CBC     Status: Abnormal   Collection Time: 12/19/20  4:52 AM  Result Value Ref Range   WBC 5.2 4.0 - 10.5 K/uL   RBC 2.58 (L) 3.87 - 5.11 MIL/uL   Hemoglobin 8.5 (L) 12.0 - 15.0 g/dL   HCT 27.8 (L) 36.0 - 46.0 %   MCV 107.8 (H) 80.0 - 100.0 fL   MCH 32.9 26.0 - 34.0 pg   MCHC 30.6 30.0 - 36.0 g/dL   RDW 14.7 11.5 - 15.5 %   Platelets 130 (L) 150 - 400 K/uL    Comment: Immature Platelet Fraction may be clinically indicated, consider ordering this additional test [id number]    nRBC 0.0 0.0 - 0.2 %    Comment: Performed at [facility], [address], [place], [place] [id number]  Basic metabolic panel     Status: Abnormal   Collection Time: 12/19/20  4:52 AM  Result Value Ref Range   Sodium 136 135 - 145 mmol/L   Potassium 3.8 3.5 - 5.1 mmol/L   Chloride 106 98 - 111 mmol/L   CO2 23 22 - 32 mmol/L   Glucose, Bld 86 70 - 99 mg/dL    Comment: Glucose reference range applies only to samples taken after fasting for at least 8 hours.   BUN 19 8 - 23 mg/dL   Creatinine, Ser 1.28 (H) 0.44 - 1.00 mg/dL   Calcium 8.4 (L) 8.9 - 10.3 mg/dL   GFR, Estimated 42 (L) >60 mL/min    Comment: (NOTE) Calculated using the CKD-EPI Creatinine Equation (2021)    Anion gap 7 5 - 15    Comment: Performed at [facility], [address] [place], [place] [address]  Protime-INR     Status: Abnormal   Collection Time: 12/19/20  4:52 AM  Result Value Ref Range   Prothrombin Time 35.2 (H) 11.4 - 15.2 seconds   INR 3.5 (H) 0.8 -  1.2    Comment: (NOTE) INR goal varies based on device and disease states. Performed at [facility], [address] [place], [place] [address]     DG Elbow Complete Right  Result Date: 12/18/2020 CLINICAL DATA:  Fall.  Skin tear.  Pain. EXAM: RIGHT ELBOW - COMPLETE 3+ VIEW COMPARISON:  None. FINDINGS: The lateral view is limited due to positioning. No fracture identified. IMPRESSION: The study is limited due to positioning the lateral  view. Within this limitation, no fracture identified. If there is continued concern, recommend repeating the lateral view. Electronically Signed   By: Dorise Bullion III M.D.   On: 12/18/2020 09:51   CT HEAD WO CONTRAST  Result Date: 12/18/2020 CLINICAL DATA:  Head trauma, minor EXAM: CT HEAD WITHOUT CONTRAST TECHNIQUE: Contiguous axial images were obtained from the base of the skull through the vertex without intravenous contrast. COMPARISON:  08/11/2020 FINDINGS: Brain: Chronic small vessel ischemia in the hemispheric white matter. Small remote right cerebellar infarcts. No hemorrhage, hydrocephalus, or masslike finding. Generalized brain atrophy. Vascular: No hyperdense vessel or unexpected calcification. Skull: Normal. Negative for fracture or focal lesion. Sinuses/Orbits: No acute finding. Chronic right maxillary sinusitis with calcified debris, partially covered IMPRESSION: Aging brain.  No acute or interval finding. Electronically Signed   By: Jorje Guild M.D.   On: 12/18/2020 09:29   CT Hip Right Wo Contrast  Result Date: 12/18/2020 CLINICAL DATA:  Right hip pain after fall. EXAM: CT OF THE RIGHT HIP WITHOUT CONTRAST TECHNIQUE: Multidetector CT imaging of the right hip was performed according to the standard protocol. Multiplanar CT image reconstructions were also generated. COMPARISON:  Right hip x-rays from same day. CT right hip dated December 08, 2019. FINDINGS: Bones/Joint/Cartilage Acute nondisplaced fracture of the right inferior pubic ramus (series 3, image 85). Acute nondisplaced fracture of the inferior right sacral ala (series 3, image 18). Old healed fracture of the right greater trochanter. Mild right hip osteoarthritis. No joint effusion. Ligaments Ligaments are suboptimally evaluated by CT. Muscles and Tendons Grossly intact. Soft tissue No fluid collection or hematoma.  No soft tissue mass. IMPRESSION: 1. Acute nondisplaced fracture of the right inferior pubic ramus. 2. Acute  nondisplaced fracture of the inferior right sacral ala. Electronically Signed   By: Titus Dubin M.D.   On: 12/18/2020 10:55   DG Chest Port 1 View  Result Date: 12/18/2020 CLINICAL DATA:  Fall.  Suspected hip fracture. EXAM: PORTABLE CHEST 1 VIEW COMPARISON:  Aug 11, 2020 FINDINGS: No pneumothorax. Stable cardiomegaly. The hila and mediastinum are unremarkable. Small right effusion with underlying atelectasis. No other interval changes. IMPRESSION: Cardiomegaly. Small right effusion with underlying atelectasis. No other interval changes. Electronically Signed   By: Dorise Bullion III M.D.   On: 12/18/2020 09:49   DG Hip Unilat With Pelvis 2-3 Views Right  Result Date: 12/18/2020 CLINICAL DATA:  Fall on Coumadin.  Suspected hip fracture. EXAM: DG HIP (WITH OR WITHOUT PELVIS) 2-3V RIGHT COMPARISON:  None. FINDINGS: There is no evidence of hip fracture or dislocation. There is no evidence of arthropathy or other focal bone abnormality. IMPRESSION: Negative. Electronically Signed   By: Dorise Bullion III M.D.   On: 12/18/2020 09:48    Review of Systems Blood pressure (!) 114/58, pulse 95, temperature 98.4 F (36.9 C), resp. rate 19, height '5\' 7"'$  (1.702 m), weight 54.4 kg, SpO2 96 %. Physical Exam Vitals and nursing note reviewed.  Constitutional:      General: She is not in acute distress.  Appearance: Normal appearance. She is not ill-appearing, toxic-appearing or diaphoretic.  HENT:     Head: Normocephalic and atraumatic.     Nose: No congestion or rhinorrhea.     Mouth/Throat:     Pharynx: Oropharynx is clear.  Eyes:     General: No scleral icterus.    Extraocular Movements: Extraocular movements intact.     Conjunctiva/sclera: Conjunctivae normal.  Cardiovascular:     Rate and Rhythm: Normal rate.     Pulses: Normal pulses.  Pulmonary:     Effort: Pulmonary effort is normal. No respiratory distress.     Breath sounds: No stridor. No wheezing.  Chest:     Chest wall: No  tenderness.  Abdominal:     General: Abdomen is flat. Bowel sounds are normal.     Palpations: Abdomen is soft. There is no mass.  Musculoskeletal:     Cervical back: No rigidity or tenderness.     Comments: Leg lengths are equal  Left lower extremity skin is normal, no tenderness, range of motion is normal without pain there is no instability of the knee hip or ankle and the muscle tone is normal  The right hip the skin is also normal there is tenderness in the groin painful range of motion is noted knee and ankle stability is normal and muscle tone is normal  Skin:    General: Skin is warm and dry.     Capillary Refill: Capillary refill takes less than 2 seconds.  Neurological:     Mental Status: She is alert.     Sensory: No sensory deficit.     Motor: No weakness.     Gait: Gait abnormal.     Comments: She seems lucid oriented to person and place question time  Psychiatric:        Mood and Affect: Mood normal.      Assessment/Plan: This pelvic fracture is at risk for excessive bleeding CBC will need to be monitored.  As this is really an equivalent to a lateral compression type I fracture.  Although it does not require surgery quires close monitoring of hemoglobin  Weight-bear as tolerated when pain allows  Arther Abbott 12/19/2020, 11:00 AM

## 2020-12-19 NOTE — Progress Notes (Signed)
PROGRESS NOTE    Mary [name]  [id number] DOB: 1940-09-02 DOA: 12/18/2020 PCP: Bonnita Nasuti, MD    Chief Complaint  Patient presents with   Fall    Brief admission narrative:  Mary [name] is a 80 y.o. female with medical history significant of dementia, hypertension, hyperlipidemia, chronic kidney disease stage IIIb, anxiety/depression, prior history of stroke and mechanical valve replacement; who presented to the hospital secondary to mechanical fall with subsequent right hip pain.  Patient reported no loss of consciousness, dizziness, chest pain, hematuria, shortness of breath, nausea, vomiting, focal weakness or any other complaints.  She expressed tripping with her walker and landing on her right side.  Patient ended up injuring her right elbow and expressed hitting the back of her head.   COVID PCR was negative in the ED; patient is vaccinated against COVID.   ED Course: CT head without acute intracranial process, right elbow demonstrating no fracture and hip images with positive right pubic ramus fracture; no hip fracture.  Patient experiencing inability to bear weight and complaining of significant pain.  Assessment & Plan: 1-right pubic ramus fracture -Appreciate recommendations by orthopedic service; no surgical intervention needed -Weightbearing as tolerated -Continue pain management. -Will closely follow patient hemoglobin trend.  2-hypertension -Continue current antihypertensive regimen -Blood pressure stable -Heart healthy diet has been ordered.  3-gastroesophageal reflux disease -Continue PPI.  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 -Mood overall stable -Continue the use of Wellbutrin and BuSpar -Continue nightly Seroquel.  5-history of chronic kidney disease a stage IIIb -Appears to be stable and at baseline -Continue to minimize nephrotoxic agents -Follow renal function trend. -Maintain  adequate hydration.  6-mechanical valve replacement -INR goal 2.5-3.5 -Currently therapeutic -Continue Coumadin per pharmacy. -Follow hemoglobin trend.  7-anemia of chronic kidney disease -With decrease in patient's hemoglobin in the setting of hemodilution. -No signs of overt bleeding -Continue to follow hemoglobin trend.  8-chronic constipation -Continue treatment with MiraLAX, lactulose and Colace. -Maintain adequate hydration.  DVT prophylaxis: Chronically on Coumadin. Code Status: Full code Family Communication: No family at bedside; unable to reach patient's husband over the phone.  Case discussed in detail with patient at bedside. Disposition:   Status is: Inpatient  Remains inpatient appropriate because:Ongoing active pain requiring inpatient pain management  Dispo: The patient is from: ALF              Anticipated d/c is to: SNF              Patient currently is medically stable to d/c.   Difficult to place patient No       Consultants:  Orthopedic service  Procedures:  See below for x-ray reports  Antimicrobials:  None   Subjective: Still complaining of pain with movement; no chest pain, no nausea, no vomiting.  In no acute distress.  Objective: Vitals:   12/18/20 1837 12/18/20 2104 12/19/20 0155 12/19/20 0458  BP: 129/60 (!) 118/50 116/61 (!) 114/58  Pulse: (!) 101 100 78 95  Resp: '20 16 18 19  '$ Temp: 98.4 F (36.9 C) 98.6 F (37 C) 98.4 F (36.9 C) 98.4 F (36.9 C)  TempSrc: Oral Oral Oral   SpO2: 99% 97% 98% 96%  Weight:      Height:        Intake/Output Summary (Last 24 hours) at 12/19/2020 1406 Last data filed at 12/19/2020 1230 Gross per 24 hour  Intake 1812.88 ml  Output 800 ml  Net 1012.88 ml  Filed Weights   12/18/20 0802  Weight: 54.4 kg    Examination: General exam: Appears oriented x2, in no major distress while no movement.  Expressed ongoing right-sided hip and leg pain.  No chest pain, no nausea, no vomiting, no  abdominal pain. Respiratory system: No using accessory muscles; good saturation on 1 L supplementation; good air movement bilaterally. Cardiovascular system: S1 & S2 heard, RRR. No JVD, murmurs, rubs, gallops or clicks. No pedal edema. Gastrointestinal system: Abdomen is nondistended, soft and nontender. No organomegaly or masses felt. Normal bowel sounds heard. Central nervous system: Alert and oriented. No focal neurological deficits. Extremities: No cyanosis or clubbing.  Decreased range of motion especially in her right lower extremity and upper extremity secondary to pain from recent fall and fracture. Skin: No petechiae; right abrasion/skin tear over right upper extremity with clean dressings. Psychiatry: Mood & affect appropriate.     Data Reviewed: I have personally reviewed following labs and imaging studies  CBC: Recent Labs  Lab 12/18/20 0816 12/19/20 0452  WBC 5.9 5.2  NEUTROABS 3.7  --   HGB 10.1* 8.5*  HCT 32.1* 27.8*  MCV 107.7* 107.8*  PLT 137* 130*    Basic Metabolic Panel: Recent Labs  Lab 12/18/20 0816 12/19/20 0452  NA 136 136  K 3.7 3.8  CL 102 106  CO2 26 23  GLUCOSE 96 86  BUN 21 19  CREATININE 1.26* 1.28*  CALCIUM 8.7* 8.4*  MG 2.1  --   PHOS 2.9  --     GFR: Estimated Creatinine Clearance: 29.1 mL/min (A) (by C-G formula based on SCr of 1.28 mg/dL (H)).   Recent Results (from the past 240 hour(s))  Resp Panel by RT-PCR (Flu A&B, Covid) Nasopharyngeal Swab     Status: None   Collection Time: 12/18/20 12:53 PM   Specimen: Nasopharyngeal Swab; Nasopharyngeal(NP) swabs in vial transport medium  Result Value Ref Range Status   SARS Coronavirus 2 by RT PCR NEGATIVE NEGATIVE Final    Comment: (NOTE) SARS-CoV-2 target nucleic acids are NOT DETECTED.  The SARS-CoV-2 RNA is generally detectable in upper respiratory specimens during the acute phase of infection. The lowest concentration of SARS-CoV-2 viral copies this assay can detect is 138  copies/mL. A negative result does not preclude SARS-Cov-2 infection and should not be used as the sole basis for treatment or other patient management decisions. A negative result may occur with  improper specimen collection/handling, submission of specimen other than nasopharyngeal swab, presence of viral mutation(s) within the areas targeted by this assay, and inadequate number of viral copies(<138 copies/mL). A negative result must be combined with clinical observations, patient history, and epidemiological information. The expected result is Negative.  Fact Sheet for Patients:  EntrepreneurPulse.com.au  Fact Sheet for Healthcare Providers:  IncredibleEmployment.be  This test is no t yet approved or cleared by the [place] FDA and  has been authorized for detection and/or diagnosis of SARS-CoV-2 by FDA under an Emergency Use Authorization (EUA). This EUA will remain  in effect (meaning this test can be used) for the duration of the COVID-19 declaration under Section 564(b)(1) of the Act, 21 U.S.C.section 360bbb-3(b)(1), unless the authorization is terminated  or revoked sooner.       Influenza A by PCR NEGATIVE NEGATIVE Final   Influenza B by PCR NEGATIVE NEGATIVE Final    Comment: (NOTE) The Xpert Xpress SARS-CoV-2/FLU/RSV plus assay is intended as an aid in the diagnosis of influenza from Nasopharyngeal swab specimens and should not be  used as a sole basis for treatment. Nasal washings and aspirates are unacceptable for Xpert Xpress SARS-CoV-2/FLU/RSV testing.  Fact Sheet for Patients: EntrepreneurPulse.com.au  Fact Sheet for Healthcare Providers: IncredibleEmployment.be  This test is not yet approved or cleared by the [place] FDA and has been authorized for detection and/or diagnosis of SARS-CoV-2 by FDA under an Emergency Use Authorization (EUA). This EUA will remain in effect (meaning  this test can be used) for the duration of the COVID-19 declaration under Section 564(b)(1) of the Act, 21 U.S.C. section 360bbb-3(b)(1), unless the authorization is terminated or revoked.  Performed at [facility], [address], [place], [place] [address]      Radiology Studies: DG Elbow Complete Right  Result Date: 12/18/2020 CLINICAL DATA:  Fall.  Skin tear.  Pain. EXAM: RIGHT ELBOW - COMPLETE 3+ VIEW COMPARISON:  None. FINDINGS: The lateral view is limited due to positioning. No fracture identified. IMPRESSION: The study is limited due to positioning the lateral view. Within this limitation, no fracture identified. If there is continued concern, recommend repeating the lateral view. Electronically Signed   By: Dorise Bullion III M.D.   On: 12/18/2020 09:51   CT HEAD WO CONTRAST  Result Date: 12/18/2020 CLINICAL DATA:  Head trauma, minor EXAM: CT HEAD WITHOUT CONTRAST TECHNIQUE: Contiguous axial images were obtained from the base of the skull through the vertex without intravenous contrast. COMPARISON:  08/11/2020 FINDINGS: Brain: Chronic small vessel ischemia in the hemispheric white matter. Small remote right cerebellar infarcts. No hemorrhage, hydrocephalus, or masslike finding. Generalized brain atrophy. Vascular: No hyperdense vessel or unexpected calcification. Skull: Normal. Negative for fracture or focal lesion. Sinuses/Orbits: No acute finding. Chronic right maxillary sinusitis with calcified debris, partially covered IMPRESSION: Aging brain.  No acute or interval finding. Electronically Signed   By: Jorje Guild M.D.   On: 12/18/2020 09:29   CT Hip Right Wo Contrast  Result Date: 12/18/2020 CLINICAL DATA:  Right hip pain after fall. EXAM: CT OF THE RIGHT HIP WITHOUT CONTRAST TECHNIQUE: Multidetector CT imaging of the right hip was performed according to the standard protocol. Multiplanar CT image reconstructions were also generated. COMPARISON:  Right hip x-rays from same day.  CT right hip dated December 08, 2019. FINDINGS: Bones/Joint/Cartilage Acute nondisplaced fracture of the right inferior pubic ramus (series 3, image 85). Acute nondisplaced fracture of the inferior right sacral ala (series 3, image 18). Old healed fracture of the right greater trochanter. Mild right hip osteoarthritis. No joint effusion. Ligaments Ligaments are suboptimally evaluated by CT. Muscles and Tendons Grossly intact. Soft tissue No fluid collection or hematoma.  No soft tissue mass. IMPRESSION: 1. Acute nondisplaced fracture of the right inferior pubic ramus. 2. Acute nondisplaced fracture of the inferior right sacral ala. Electronically Signed   By: Titus Dubin M.D.   On: 12/18/2020 10:55   DG Chest Port 1 View  Result Date: 12/18/2020 CLINICAL DATA:  Fall.  Suspected hip fracture. EXAM: PORTABLE CHEST 1 VIEW COMPARISON:  Aug 11, 2020 FINDINGS: No pneumothorax. Stable cardiomegaly. The hila and mediastinum are unremarkable. Small right effusion with underlying atelectasis. No other interval changes. IMPRESSION: Cardiomegaly. Small right effusion with underlying atelectasis. No other interval changes. Electronically Signed   By: Dorise Bullion III M.D.   On: 12/18/2020 09:49   DG Hip Unilat With Pelvis 2-3 Views Right  Result Date: 12/18/2020 CLINICAL DATA:  Fall on Coumadin.  Suspected hip fracture. EXAM: DG HIP (WITH OR WITHOUT PELVIS) 2-3V RIGHT COMPARISON:  None. FINDINGS: There  is no evidence of hip fracture or dislocation. There is no evidence of arthropathy or other focal bone abnormality. IMPRESSION: Negative. Electronically Signed   By: Dorise Bullion III M.D.   On: 12/18/2020 09:48     Scheduled Meds:  buPROPion  150 mg Oral Daily   busPIRone  5 mg Oral BID   docusate sodium  100 mg Oral BID   lactulose  20 g Oral Daily   pantoprazole  40 mg Oral QAC breakfast   polyethylene glycol  17 g Oral Daily   QUEtiapine  25 mg Oral QHS   torsemide  20 mg Oral Daily   warfarin  2  mg Oral ONCE-1600   Warfarin - Pharmacist Dosing Inpatient   Does not apply q1600   Continuous Infusions:  sodium chloride 10 mL/hr at 12/18/20 0842     LOS: 1 day    Time spent: 35 minutes.   Barton Dubois, MD [facility]   To contact the attending provider between 7A-7P or the covering provider during after hours 7P-7A, please log into the web site [email or web address] and access using universal [place] password for that web site. If you do not have the password, please call the hospital operator.  12/19/2020, 2:06 PM

## 2020-12-19 NOTE — TOC Initial Note (Signed)
Transition of Care [facility]) - Initial/Assessment Note    Patient Details  Name: Mary [name] MRN: [id number] Date of Birth: 01-Mar-1941  [facility]) CM/SW Contact:    Iona Beard, [place] Phone Number: 12/19/2020, 2:34 PM  Clinical Narrative:                 CSW spoke with pt to complete assessment. Pt states that she is a resident at [facility]. Pt is able to complete her ADLs independently. Pt states that she has not had [place] services. Pt states that she has a walker to use. Pt states that she is agreeable to SNF at D/C. CSW to complete Fl2 and send out to local facilities. TOC to follow.   Expected Discharge Plan: Skilled Nursing Facility Barriers to Discharge: Continued Medical Work up   Patient Goals and CMS Choice Patient states their goals for this hospitalization and ongoing recovery are:: Go to rehab CMS Medicare.gov Compare Post Acute Care list provided to:: Patient Choice offered to / list presented to : Patient  Expected Discharge Plan and Services Expected Discharge Plan: [place] In-house Referral: Clinical Social Work Discharge Planning Services: CM Consult Post Acute Care Choice: [place] Living arrangements for the past 2 months: [place]                                      Prior Living Arrangements/Services Living arrangements for the past 2 months: [place] Lives with:: Self Patient language and need for interpreter reviewed:: Yes Do you feel safe going back to the place where you live?: Yes      Need for Family Participation in Patient Care: Yes (Comment) Care giver support system in place?: Yes (comment) Current home services: DME Criminal Activity/Legal Involvement Pertinent to Current Situation/Hospitalization: No - Comment as needed  Activities of Daily Living Home Assistive Devices/Equipment: Gilford Rile (specify type) ADL Screening (condition at time of  admission) Patient's cognitive ability adequate to safely complete daily activities?: No Is the patient deaf or have difficulty hearing?: No Does the patient have difficulty seeing, even when wearing glasses/contacts?: No Does the patient have difficulty concentrating, remembering, or making decisions?: Yes Patient able to express need for assistance with ADLs?: Yes Does the patient have difficulty dressing or bathing?: Yes Independently performs ADLs?: Yes (appropriate for developmental age) Does the patient have difficulty walking or climbing stairs?: Yes Weakness of Legs: Left Weakness of Arms/Hands: None  Permission Sought/Granted                  Emotional Assessment Appearance:: Appears stated age Attitude/Demeanor/Rapport: Engaged Affect (typically observed): Accepting Orientation: : Oriented to Self, Oriented to Place, Oriented to  Time, Oriented to Situation Alcohol / Substance Use: Not Applicable Psych Involvement: No (comment)  Admission diagnosis:  Pelvis fracture ([place]) [S32.9XXA] Fall, initial encounter [id number]] Multiple closed fractures of pelvis without disruption of pelvic ring, initial encounter [facility]) [S32.82XA] Patient Active Problem List   Diagnosis Date Noted   Pelvis fracture ([place]) 12/18/2020   Malnutrition of moderate degree 08/13/2020   Closed displaced fracture of left femoral neck ([place]) 08/13/2020   Hip fracture ([place]) 08/11/2020   Agitation 02/21/2020   Confusion 02/21/2020   Hyperkalemia 01/15/2020   AKI (acute kidney injury) on CKD 3b 01/15/2020   Anticoagulant long-term use 01/15/2020   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   Acute  on chronic combined systolic and diastolic CHF (congestive heart failure) ([place])    Anasarca 04/25/2019   Hypoalbuminemia 04/25/2019   Persistent atrial fibrillation ([place]) 04/25/2019   CKD (chronic kidney disease), stage III ([place]) 04/25/2019   Anemia due to chronic kidney disease 04/25/2019    Pressure injury of skin 04/24/2019   Fall 04/23/2019   S/P right knee arthroscopy 04/13/2015   OSA (obstructive sleep apnea) 01/01/2013   Diabetes ([place]) 10/23/2012   S/P aortic valve replacement 06/30/2010   Hyperlipidemia 09/29/2008   Obesity 09/29/2008   Essential hypertension 09/29/2008   DEGENERATIVE JOINT DISEASE 09/29/2008   PCP:  Bonnita Nasuti, MD Pharmacy:   [facility] [address] [place] [address] [name] [address] Phone: [phone] Fax: [phone]     Social Determinants of Health (SDOH) Interventions    Readmission Risk Interventions Readmission Risk Prevention Plan 12/19/2020  Transportation Screening Complete  Medication Review (RN Care Manager) Complete  HRI or Home Care Consult Complete  SW Recovery Care/Counseling Consult Complete  Palliative Care Screening Not Applicable  Skilled Nursing Facility Complete  Some recent data might be hidden

## 2020-12-19 NOTE — Plan of Care (Signed)
  Problem: Acute Rehab PT Goals(only PT should resolve) Goal: Pt will Roll Supine to Side Outcome: Progressing Flowsheets (Taken 12/19/2020 1210) Pt will Roll Supine to Side: with min assist Goal: Pt Will Go Supine/Side To Sit Outcome: Progressing Flowsheets (Taken 12/19/2020 1210) Pt will go Supine/Side to Sit: with min guard assist Goal: Patient Will Transfer Sit To/From Stand Outcome: Progressing Flowsheets (Taken 12/19/2020 1210) Patient will transfer sit to/from stand: with min guard assist Goal: Pt Will Transfer Bed To Chair/Chair To Bed Outcome: Progressing Flowsheets (Taken 12/19/2020 1210) Pt will Transfer Bed to Chair/Chair to Bed: with min assist Goal: Pt Will Ambulate Outcome: Progressing Flowsheets (Taken 12/19/2020 1210) Pt will Ambulate:  15 feet  with minimal assist  with least restrictive assistive device  12:11 PM,12/19/20 Domenic Moras, PT, DPT Physical Therapist at [facility]

## 2020-12-19 NOTE — Progress Notes (Signed)
ANTICOAGULATION CONSULT NOTE -   Pharmacy Consult for warfarin Indication: mechanical heart valve, Afib  Allergies  Allergen Reactions   Tape Itching    Redness, Please use "paper" tape only    Patient Measurements: Height: '5\' 7"'$  (170.2 cm) Weight: 54.4 kg (120 lb) IBW/kg (Calculated) : 61.6 Heparin Dosing Weight:   Vital Signs: Temp: 98.4 F (36.9 C) (10/02 0458) Temp Source: Oral (10/02 0155) BP: 114/58 (10/02 0458) Pulse Rate: 95 (10/02 0458)  Labs: Recent Labs    12/18/20 0816 12/19/20 0452  HGB 10.1* 8.5*  HCT 32.1* 27.8*  PLT 137* 130*  LABPROT 36.0* 35.2*  INR 3.6* 3.5*  CREATININE 1.26* 1.28*     Estimated Creatinine Clearance: 29.1 mL/min (A) (by C-G formula based on SCr of 1.28 mg/dL (H)).   Medical History: Past Medical History:  Diagnosis Date   Anxiety    Diabetes mellitus with neuropathy (HCC)    Diabetic neuropathy (HCC)    DJD (degenerative joint disease)    H/O aortic valve replacement    Hyperlipidemia    Hypertension    Neuromuscular disorder ([place])    neuropathy in feet   Obesity    Sleep apnea    Stroke ([place])    " light stroke"   Wears glasses     Medications:  Medications Prior to Admission  Medication Sig Dispense Refill Last Dose   acetaminophen (TYLENOL) 500 MG tablet Take 500 mg by mouth 2 (two) times daily as needed for mild pain or moderate pain.       bisacodyl (DULCOLAX) 5 MG EC tablet Take 5 mg by mouth daily as needed for moderate constipation.      buPROPion (WELLBUTRIN SR) 150 MG 12 hr tablet Take 150 mg by mouth daily.      busPIRone (BUSPAR) 5 MG tablet Take 5 mg by mouth 2 (two) times daily.      cetirizine (ZYRTEC) 10 MG tablet Take 10 mg by mouth at bedtime as needed for allergies.   11    docusate sodium (COLACE) 100 MG capsule Take 1 capsule (100 mg total) by mouth 2 (two) times daily. 10 capsule 0    fluticasone (FLONASE) 50 MCG/ACT nasal spray Place 1 spray into both nostrils as needed.   11    lactulose  (CHRONULAC) 10 GM/15ML solution Take 30 mLs (20 g total) by mouth daily. 236 mL 0    methocarbamol (ROBAXIN) 500 MG tablet Take 1 tablet (500 mg total) by mouth every 6 (six) hours as needed for muscle spasms.      oxyCODONE-acetaminophen (PERCOCET) 5-325 MG tablet Take 1-2 tablets by mouth every 8 (eight) hours as needed for severe pain. 30 tablet 0    pantoprazole (PROTONIX) 40 MG tablet Take 1 tablet (40 mg total) by mouth daily before breakfast. 30 tablet 5    torsemide (DEMADEX) 20 MG tablet Take 20 mg by mouth daily.      warfarin (COUMADIN) 3 MG tablet Take 1 tablet (3 mg total) by mouth daily.       Assessment: Pharmacy consulted to dose warfarin in patient with mechanical AVR and Afib.  Patient's home dose listed as 3 mg daily.  Hgb  8.5  INR 3.6 > 3.5   Goal of Therapy:  INR goal 2.5-3.5 Monitor platelets by anticoagulation protocol: Yes   Plan:  Warfarin 2 mg x 1 dose Monitor daily INR and s/s of bleeding.  Margot Ables, PharmD Clinical Pharmacist 12/19/2020 8:37 AM

## 2020-12-19 NOTE — Evaluation (Signed)
Physical Therapy Evaluation Patient Details Name: Mary [name] MRN: [id number] DOB: [birth year] Today's Date: 12/19/2020  History of Present Illness  Mary [name] is a 80 y.o. female with medical history significant of dementia, hypertension, hyperlipidemia, chronic kidney disease stage IIIb, anxiety/depression, prior history of stroke and mechanical valve replacement; who presented to the hospital secondary to mechanical fall with subsequent right hip pain.  Patient reported no loss of consciousness, dizziness, chest pain, hematuria, shortness of breath, nausea, vomiting, focal weakness or any other complaints.  She expressed tripping with her walker and landing on her right side.  Patient ended up injuring her right elbow and expressed hitting the back of her head. CT head without acute intracranial process, right elbow demonstrating no fracture and hip images with positive right pubic ramus fracture; no hip fracture.  Patient experiencing inability to bear weight and complaining of significant pain.  Clinical Impression  Present in room and agreeable to PT eval. Presents with increased pain throughout session resulting in increased difficulty with bed mobility and strength. Unable to roll with assist with HOB flat, able to complete with ModA-MaxA with HOB elevated. Demo good improvement in transitioning to SL and able to move lower extremities to EOB. Unable to hold sitting position once transition secondary to pain. RN notified of mobility and pain. Patient will benefit from continued physical therapy in hospital and recommended venue below to increase strength, balance, endurance for safe ADLs and gait.        Recommendations for follow up therapy are one component of a multi-disciplinary discharge planning process, led by the attending physician.  Recommendations may be updated based on patient status, additional functional criteria and insurance authorization.  Follow Up Recommendations SNF     Equipment Recommendations  None recommended by PT    Recommendations for Other Services       Precautions / Restrictions Precautions Precautions: Fall Restrictions Weight Bearing Restrictions: No      Mobility  Bed Mobility Overal bed mobility: Needs Assistance Bed Mobility: Rolling;Sidelying to Sit Rolling: Max assist Sidelying to sit: Max assist       General bed mobility comments: with HOB elevated, unable to complete with HOB flat Patient Response: Cooperative  Transfers                    Ambulation/Gait                Stairs            Wheelchair Mobility    Modified Rankin (Stroke Patients Only)       Balance Overall balance assessment: Needs assistance   Sitting balance-Leahy Scale: Zero Sitting balance - Comments: attempt transitioning to sit EOB, unable to hold with MaxA for 3 sec before returning to supine.                                     Pertinent Vitals/Pain Pain Assessment: 0-10 Pain Score: 8  Pain Location: R hip Pain Intervention(s): Limited activity within patient's tolerance;Patient requesting pain meds-RN notified;Monitored during session    Home Living Family/patient expects to be discharged to:: Assisted living               Home Equipment: Walker - 4 wheels;Cane - single point;Shower seat;Grab bars - toilet;Grab bars - tub/shower Additional Comments: ALF bed is flat, home bed can transition to sitting.    Prior Function Level  of Independence: Needs assistance   Gait / Transfers Assistance Needed: Uses Rollator for most ambulation  ADL's / Homemaking Assistance Needed: assistance at ALF        Hand Dominance   Dominant Hand: Right    Extremity/Trunk Assessment   Upper Extremity Assessment Upper Extremity Assessment: Generalized weakness    Lower Extremity Assessment Lower Extremity Assessment: Generalized weakness    Cervical / Trunk Assessment Cervical / Trunk  Assessment: Normal  Communication   Communication: No difficulties  Cognition Arousal/Alertness: Awake/alert Behavior During Therapy: WFL for tasks assessed/performed Overall Cognitive Status: Within Functional Limits for tasks assessed                                        General Comments      Exercises     Assessment/Plan    PT Assessment Patient needs continued PT services  PT Problem List Decreased strength;Decreased range of motion;Decreased knowledge of use of DME;Decreased activity tolerance;Decreased safety awareness;Decreased balance;Decreased knowledge of precautions;Decreased mobility;Decreased coordination       PT Treatment Interventions DME instruction;Balance training;Gait training;Neuromuscular re-education;Stair training;Functional mobility training;Patient/family education;Therapeutic activities;Therapeutic exercise    PT Goals (Current goals can be found in the Care Plan section)  Acute Rehab PT Goals Patient Stated Goal: decrease pain PT Goal Formulation: With patient Time For Goal Achievement: 01/02/21 Potential to Achieve Goals: Good    Frequency Min 3X/week   Barriers to discharge        Co-evaluation               AM-PAC PT "6 Clicks" Mobility  Outcome Measure Help needed turning from your back to your side while in a flat bed without using bedrails?: A Lot Help needed moving from lying on your back to sitting on the side of a flat bed without using bedrails?: Total Help needed moving to and from a bed to a chair (including a wheelchair)?: Total Help needed standing up from a chair using your arms (e.g., wheelchair or bedside chair)?: Total Help needed to walk in hospital room?: Total Help needed climbing 3-5 steps with a railing? : Total 6 Click Score: 7    End of Session Equipment Utilized During Treatment: Oxygen Activity Tolerance: Patient limited by pain;Patient limited by fatigue Patient left: in bed;with  family/visitor present;with call bell/phone within reach Nurse Communication: Mobility status;Patient requests pain meds PT Visit Diagnosis: Unsteadiness on feet (R26.81);Other abnormalities of gait and mobility (R26.89);Repeated falls (R29.6);Muscle weakness (generalized) (M62.81);History of falling (Z91.81)    Time: [id number] PT Time Calculation (min) (ACUTE ONLY): 25 min   Charges:   PT Evaluation $PT Eval Low Complexity: 1 Low PT Treatments $Therapeutic Activity: 8-22 mins        12:10 PM,12/19/20 Domenic Moras, PT, DPT Physical Therapist at [facility]

## 2020-12-20 DIAGNOSIS — S32810A Multiple fractures of pelvis with stable disruption of pelvic ring, initial encounter for closed fracture: Secondary | ICD-10-CM | POA: Diagnosis not present

## 2020-12-20 LAB — PROTIME-INR
INR: 3 — ABNORMAL HIGH (ref 0.8–1.2)
Prothrombin Time: 31.2 seconds — ABNORMAL HIGH (ref 11.4–15.2)

## 2020-12-20 LAB — CBC
HCT: 28.1 % — ABNORMAL LOW (ref 36.0–46.0)
Hemoglobin: 8.5 g/dL — ABNORMAL LOW (ref 12.0–15.0)
MCH: 33.3 pg (ref 26.0–34.0)
MCHC: 30.2 g/dL (ref 30.0–36.0)
MCV: 110.2 fL — ABNORMAL HIGH (ref 80.0–100.0)
Platelets: 125 10*3/uL — ABNORMAL LOW (ref 150–400)
RBC: 2.55 MIL/uL — ABNORMAL LOW (ref 3.87–5.11)
RDW: 15.3 % (ref 11.5–15.5)
WBC: 5.2 10*3/uL (ref 4.0–10.5)
nRBC: 0 % (ref 0.0–0.2)

## 2020-12-20 MED ORDER — WARFARIN SODIUM 1 MG PO TABS
3.0000 mg | ORAL_TABLET | Freq: Once | ORAL | Status: AC
Start: 2020-12-20 — End: 2020-12-21

## 2020-12-20 NOTE — Progress Notes (Signed)
ANTICOAGULATION CONSULT NOTE -   Pharmacy Consult for warfarin Indication: mechanical heart valve, Afib  Allergies  Allergen Reactions   Tape Itching    Redness, Please use "paper" tape only    Patient Measurements: Height: '5\' 7"'$  (170.2 cm) Weight: 54.4 kg (120 lb) IBW/kg (Calculated) : 61.6  Vital Signs: Temp: 98.1 F (36.7 C) (10/03 0542) Temp Source: Oral (10/03 0542) BP: 111/45 (10/03 0542) Pulse Rate: 103 (10/03 0542)  Labs: Recent Labs    12/18/20 0816 12/19/20 0452 12/20/20 0548  HGB 10.1* 8.5* 8.5*  HCT 32.1* 27.8* 28.1*  PLT 137* 130* 125*  LABPROT 36.0* 35.2* 31.2*  INR 3.6* 3.5* 3.0*  CREATININE 1.26* 1.28*  --      Estimated Creatinine Clearance: 29.1 mL/min (A) (by C-G formula based on SCr of 1.28 mg/dL (H)).   Medical History: Past Medical History:  Diagnosis Date   Anxiety    Diabetes mellitus with neuropathy (HCC)    Diabetic neuropathy (HCC)    DJD (degenerative joint disease)    H/O aortic valve replacement    Hyperlipidemia    Hypertension    Neuromuscular disorder ([place])    neuropathy in feet   Obesity    Sleep apnea    Stroke ([place])    " light stroke"   Wears glasses     Medications:  Medications Prior to Admission  Medication Sig Dispense Refill Last Dose   acetaminophen (TYLENOL) 500 MG tablet Take 500 mg by mouth 2 (two) times daily as needed for mild pain or moderate pain.       Ascorbic Acid (VITAMIN C) 100 MG tablet Take 100 mg by mouth 2 (two) times daily.   12/18/2020   bisacodyl (DULCOLAX) 5 MG EC tablet Take 5 mg by mouth daily as needed for moderate constipation.      buPROPion (WELLBUTRIN SR) 150 MG 12 hr tablet Take 150 mg by mouth daily.   12/18/2020   busPIRone (BUSPAR) 5 MG tablet Take 5 mg by mouth 2 (two) times daily.   12/18/2020   cetirizine (ZYRTEC) 10 MG tablet Take 10 mg by mouth at bedtime as needed for allergies.   11 12/18/2020   docusate sodium (COLACE) 100 MG capsule Take 1 capsule (100 mg total) by mouth  2 (two) times daily. 10 capsule 0 12/18/2020   fluticasone (FLONASE) 50 MCG/ACT nasal spray Place 1 spray into both nostrils as needed.   11 12/18/2020   HYDROcodone-acetaminophen (NORCO) 10-325 MG tablet Take 1 tablet by mouth 2 (two) times daily.   12/18/2020   lactulose (CHRONULAC) 10 GM/15ML solution Take 30 mLs (20 g total) by mouth daily. 236 mL 0 12/18/2020   methocarbamol (ROBAXIN) 500 MG tablet Take 1 tablet (500 mg total) by mouth every 6 (six) hours as needed for muscle spasms.      oxyCODONE-acetaminophen (PERCOCET) 5-325 MG tablet Take 1-2 tablets by mouth every 8 (eight) hours as needed for severe pain. 30 tablet 0 12/18/2020   pantoprazole (PROTONIX) 40 MG tablet Take 1 tablet (40 mg total) by mouth daily before breakfast. 30 tablet 5 12/18/2020   QUEtiapine (SEROQUEL) 25 MG tablet Take 25 mg by mouth at bedtime.   Past Week   simvastatin (ZOCOR) 40 MG tablet Take 40 mg by mouth at bedtime.   Past Week   sodium bicarbonate 650 MG tablet Take 650 mg by mouth 3 (three) times daily.   12/18/2020   spironolactone (ALDACTONE) 100 MG tablet Take 100 mg by mouth daily.  12/18/2020   torsemide (DEMADEX) 20 MG tablet Take 20 mg by mouth daily.   12/18/2020   vitamin B-12 (CYANOCOBALAMIN) 500 MCG tablet Take 500 mcg by mouth daily.   12/18/2020   warfarin (COUMADIN) 3 MG tablet Take 1 tablet (3 mg total) by mouth daily. (Patient taking differently: Take 3 mg by mouth daily. On Monday Wed and Friday)   Past Week   zinc sulfate 220 (50 Zn) MG capsule Take 220 mg by mouth daily.   12/18/2020    Assessment: Pharmacy consulted to dose warfarin in patient with mechanical AVR and Afib.  Patient's home dose listed as 3 mg daily. Right pubic ramus fracture, no surgical intervention needed.  Hgb  8.5 stable  INR 3.0  Goal of Therapy:  INR goal 2.5-3.5   mechanical valve replacement Monitor platelets by anticoagulation protocol: Yes   Plan:  Warfarin 3 mg x 1 dose Monitor daily INR and s/s of  bleeding.  Isac Sarna, BS Pharm D, BCPS Clinical Pharmacist Pager [phone] 12/20/2020 8:30 AM

## 2020-12-20 NOTE — TOC Progression Note (Signed)
Transition of Care [facility]) - Progression Note    Patient Details  Name: YAHVI VENKATARAMAN MRN: [id number] Date of Birth: Oct 30, 1940  [facility] CM/SW Contact  Shade Flood, LCSW Phone Number: 12/20/2020, 10:55 AM  Clinical Narrative:     TOC following. Spoke with pt and her husband to update on bed offers. They choose [facility]. Updated Melissa at [facility]. They can accept once insurance auth obtained. Pt is medically ready once auth obtained per MD.  Percell Miller initiated. TOC will follow.  Expected Discharge Plan: Skilled Nursing Facility Barriers to Discharge: Ship broker  Expected Discharge Plan and Services Expected Discharge Plan: [place] In-house Referral: Clinical Social Work Discharge Planning Services: CM Consult Post Acute Care Choice: [place] Living arrangements for the past 2 months: [place]                                       Social Determinants of Health (SDOH) Interventions    Readmission Risk Interventions Readmission Risk Prevention Plan 12/19/2020  Transportation Screening Complete  Medication Review Press [employer]) Complete  HRI or Home Care Consult Complete  SW Recovery Care/Counseling Consult Complete  Palliative Care Screening Not Applicable  Skilled Nursing Facility Complete  Some recent data might be hidden

## 2020-12-20 NOTE — Progress Notes (Signed)
Physical Therapy Treatment Patient Details Name: Mary [name] MRN: [id number] DOB: 1940/05/31 Today's Date: 12/20/2020   History of Present Illness Mary [name] is a 80 y.o. female with medical history significant of dementia, hypertension, hyperlipidemia, chronic kidney disease stage IIIb, anxiety/depression, prior history of stroke and mechanical valve replacement; who presented to the hospital secondary to mechanical fall with subsequent right hip pain.  Patient reported no loss of consciousness, dizziness, chest pain, hematuria, shortness of breath, nausea, vomiting, focal weakness or any other complaints.  She expressed tripping with her walker and landing on her right side.  Patient ended up injuring her right elbow and expressed hitting the back of her head. CT head without acute intracranial process, right elbow demonstrating no fracture and hip images with positive right pubic ramus fracture; no hip fracture.  Patient experiencing inability to bear weight and complaining of significant pain.    PT Comments    Pt sitting up in bed with spouse present.  Compliant and willing to try therapy today.  Pt with increased time to complete mobility due to pain.  Max cues with steps for movement to edge of bed with ability to complete more movement on own with increased time.  Pt able to bend up Lt knee and push to move hips around.  Pt able to sit edge of bed for 5 minutes without assist before requesting to lay back down. Pt declined transfer to chair today but is willing to try tomorrow.  PT required total assist to return to supine position.     Recommendations for follow up therapy are one component of a multi-disciplinary discharge planning process, led by the attending physician.  Recommendations may be updated based on patient status, additional functional criteria and insurance authorization.  Follow Up Recommendations  SNF     Equipment Recommendations  None recommended by PT     Recommendations for Other Services       Precautions / Restrictions Precautions Precautions: Fall Restrictions Weight Bearing Restrictions: No     Mobility  Bed Mobility Overal bed mobility: Needs Assistance Bed Mobility: Supine to Sit;Sit to Supine     Supine to sit: Max assist;Mod assist Sit to supine: Total assist   General bed mobility comments: max cues and increased time to complete    Transfers                    Ambulation/Gait                 Stairs             Wheelchair Mobility    Modified Rankin (Stroke Patients Only)       Balance                                            Cognition Arousal/Alertness: Awake/alert Behavior During Therapy: WFL for tasks assessed/performed Overall Cognitive Status: Within Functional Limits for tasks assessed                                        Exercises      General Comments        Pertinent Vitals/Pain Pain Assessment: No/denies pain Pain Score: 10-Worst pain ever Pain Location: R hip Pain Descriptors / Indicators: Aching;Sore Pain Intervention(s):  Limited activity within patient's tolerance;Patient requesting pain meds-RN notified    Home Living                      Prior Function            PT Goals (current goals can now be found in the care plan section) Acute Rehab PT Goals Patient Stated Goal: decrease pain Progress towards PT goals: Progressing toward goals    Frequency    Min 3X/week      PT Plan      Co-evaluation              AM-PAC PT "6 Clicks" Mobility   Outcome Measure  Help needed turning from your back to your side while in a flat bed without using bedrails?: A Lot Help needed moving from lying on your back to sitting on the side of a flat bed without using bedrails?: Total Help needed moving to and from a bed to a chair (including a wheelchair)?: Total Help needed standing up from a chair  using your arms (e.g., wheelchair or bedside chair)?: Total Help needed to walk in hospital room?: Total Help needed climbing 3-5 steps with a railing? : Total 6 Click Score: 7    End of Session   Activity Tolerance: Patient limited by pain;Patient limited by fatigue Patient left: in bed;with family/visitor present;with call bell/phone within reach Nurse Communication: Mobility status;Patient requests pain meds PT Visit Diagnosis: Unsteadiness on feet (R26.81);Other abnormalities of gait and mobility (R26.89);Repeated falls (R29.6);Muscle weakness (generalized) (M62.81);History of falling (Z91.81)     Time: 1022-1050 PT Time Calculation (min) (ACUTE ONLY): 28 min  Charges:  $Therapeutic Activity: 23-37 mins                     Teena Irani, PTA/CLT, WTA [phone]    Mare Ferrari, Mariajose Mow B 12/20/2020, 12:11 PM

## 2020-12-20 NOTE — Progress Notes (Signed)
PROGRESS NOTE    Mary [name]  [id number] DOB: 1940/10/11 DOA: 12/18/2020 PCP: Bonnita Nasuti, MD    Chief Complaint  Patient presents with   Fall    Brief admission narrative:  Mary [name] is a 80 y.o. female with medical history significant of dementia, hypertension, hyperlipidemia, chronic kidney disease stage IIIb, anxiety/depression, prior history of stroke and mechanical valve replacement; who presented to the hospital secondary to mechanical fall with subsequent right hip pain.  Patient reported no loss of consciousness, dizziness, chest pain, hematuria, shortness of breath, nausea, vomiting, focal weakness or any other complaints.  She expressed tripping with her walker and landing on her right side.  Patient ended up injuring her right elbow and expressed hitting the back of her head.   COVID PCR was negative in the ED; patient is vaccinated against COVID.   ED Course: CT head without acute intracranial process, right elbow demonstrating no fracture and hip images with positive right pubic ramus fracture; no hip fracture.  Patient experiencing inability to bear weight and complaining of significant pain.  Assessment & Plan: 1-right pubic ramus fracture -Appreciate recommendations by orthopedic service; no surgical intervention needed -Continue weightbearing as tolerated. -Continue pain management. -Will closely follow patient hemoglobin trend; so far stable.  Current hemoglobin 8.5. -Patient has been accepted at [facility] authorization.  Hopefully discharge 12/21/2020.  2-hypertension -Continue current antihypertensive regimen -Blood pressure stable -Heart healthy diet has been ordered.  3-gastroesophageal reflux disease -Continue PPI.  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 -Mood overall stable -Continue the use of Wellbutrin and BuSpar -Continue nightly Seroquel.  5-history of  chronic kidney disease a stage IIIb -Appears to be stable and at baseline -Continue to minimize nephrotoxic agents -Follow renal function trend. -Maintain adequate hydration.  6-mechanical valve replacement -INR goal 2.5-3.5 -Currently therapeutic -Continue Coumadin per pharmacy. -Follow hemoglobin trend.  7-anemia of chronic kidney disease -With decrease in patient's hemoglobin in the setting of hemodilution. -No signs of overt bleeding -Continue to follow hemoglobin trend.  8-chronic constipation -Continue treatment with MiraLAX, lactulose and Colace. -Maintain adequate hydration.  DVT prophylaxis: Chronically on Coumadin. Code Status: Full code Family Communication: No family at bedside; unable to reach patient's husband over the phone.  Case discussed in detail with patient at bedside. Disposition:   Status is: Inpatient  Remains inpatient appropriate because:Ongoing active pain requiring inpatient pain management  Dispo: The patient is from: ALF              Anticipated d/c is to: SNF              Patient currently is medically stable to d/c.   Difficult to place patient No    Consultants:  Orthopedic service  Procedures:  See below for x-ray reports  Antimicrobials:  None   Subjective: In no major distress, denies chest pain, no nausea, no vomiting.  Continue to express pain in her right hip with movement.  Objective: Vitals:   12/19/20 0155 12/19/20 0458 12/19/20 2056 12/20/20 0542  BP: 116/61 (!) 114/58 132/62 (!) 111/45  Pulse: 78 95 97 (!) 103  Resp: '18 19 19 18  '$ Temp: 98.4 F (36.9 C) 98.4 F (36.9 C) 98.9 F (37.2 C) 98.1 F (36.7 C)  TempSrc: Oral  Oral Oral  SpO2: 98% 96% 97% 97%  Weight:      Height:        Intake/Output Summary (Last 24 hours) at 12/20/2020 1257 Last  data filed at 12/20/2020 0820 Gross per 24 hour  Intake 480 ml  Output 1050 ml  Net -570 ml   Filed Weights   12/18/20 0802  Weight: 54.4 kg     Examination: General exam: Alert, awake, following commands appropriately; no chest pain, no nausea, no vomiting, no palpitations.  Patient continue reporting pain on her right hip with movement. Respiratory system: Clear to auscultation. Respiratory effort normal.  Good air movement bilaterally.  No using accessory muscle. Cardiovascular system:RRR. No murmurs, rubs, gallops. Gastrointestinal system: Abdomen is nondistended, soft and nontender. No organomegaly or masses felt. Normal bowel sounds heard. Central nervous system: Alert and oriented. No focal neurological deficits. Extremities: No cyanosis or clubbing.  Decreased range of motion appreciated in her right lower extremity secondary to pain.  Right elbow with bruise/abrasion from recent fall.  No signs of superimposed infection. Skin: No petechiae. Psychiatry: Mood & affect appropriate.   Data Reviewed: I have personally reviewed following labs and imaging studies  CBC: Recent Labs  Lab 12/18/20 0816 12/19/20 0452 12/20/20 0548  WBC 5.9 5.2 5.2  NEUTROABS 3.7  --   --   HGB 10.1* 8.5* 8.5*  HCT 32.1* 27.8* 28.1*  MCV 107.7* 107.8* 110.2*  PLT 137* 130* 0000000*   Basic Metabolic Panel: Recent Labs  Lab 12/18/20 0816 12/19/20 0452  NA 136 136  K 3.7 3.8  CL 102 106  CO2 26 23  GLUCOSE 96 86  BUN 21 19  CREATININE 1.26* 1.28*  CALCIUM 8.7* 8.4*  MG 2.1  --   PHOS 2.9  --     GFR: Estimated Creatinine Clearance: 29.1 mL/min (A) (by C-G formula based on SCr of 1.28 mg/dL (H)).   Recent Results (from the past 240 hour(s))  Resp Panel by RT-PCR (Flu A&B, Covid) Nasopharyngeal Swab     Status: None   Collection Time: 12/18/20 12:53 PM   Specimen: Nasopharyngeal Swab; Nasopharyngeal(NP) swabs in vial transport medium  Result Value Ref Range Status   SARS Coronavirus 2 by RT PCR NEGATIVE NEGATIVE Final    Comment: (NOTE) SARS-CoV-2 target nucleic acids are NOT DETECTED.  The SARS-CoV-2 RNA is generally  detectable in upper respiratory specimens during the acute phase of infection. The lowest concentration of SARS-CoV-2 viral copies this assay can detect is 138 copies/mL. A negative result does not preclude SARS-Cov-2 infection and should not be used as the sole basis for treatment or other patient management decisions. A negative result may occur with  improper specimen collection/handling, submission of specimen other than nasopharyngeal swab, presence of viral mutation(s) within the areas targeted by this assay, and inadequate number of viral copies(<138 copies/mL). A negative result must be combined with clinical observations, patient history, and epidemiological information. The expected result is Negative.  Fact Sheet for Patients:  EntrepreneurPulse.com.au  Fact Sheet for Healthcare Providers:  IncredibleEmployment.be  This test is no t yet approved or cleared by the [place] FDA and  has been authorized for detection and/or diagnosis of SARS-CoV-2 by FDA under an Emergency Use Authorization (EUA). This EUA will remain  in effect (meaning this test can be used) for the duration of the COVID-19 declaration under Section 564(b)(1) of the Act, 21 U.S.C.section 360bbb-3(b)(1), unless the authorization is terminated  or revoked sooner.       Influenza A by PCR NEGATIVE NEGATIVE Final   Influenza B by PCR NEGATIVE NEGATIVE Final    Comment: (NOTE) The Xpert Xpress SARS-CoV-2/FLU/RSV plus assay is intended as an aid  in the diagnosis of influenza from Nasopharyngeal swab specimens and should not be used as a sole basis for treatment. Nasal washings and aspirates are unacceptable for Xpert Xpress SARS-CoV-2/FLU/RSV testing.  Fact Sheet for Patients: EntrepreneurPulse.com.au  Fact Sheet for Healthcare Providers: IncredibleEmployment.be  This test is not yet approved or cleared by the [place] FDA  and has been authorized for detection and/or diagnosis of SARS-CoV-2 by FDA under an Emergency Use Authorization (EUA). This EUA will remain in effect (meaning this test can be used) for the duration of the COVID-19 declaration under Section 564(b)(1) of the Act, 21 U.S.C. section 360bbb-3(b)(1), unless the authorization is terminated or revoked.  Performed at [facility], [address] [place], [place] [address]      Radiology Studies: No results found.  Scheduled Meds:  buPROPion  150 mg Oral Daily   busPIRone  5 mg Oral BID   docusate sodium  100 mg Oral BID   lactulose  20 g Oral Daily   pantoprazole  40 mg Oral QAC breakfast   polyethylene glycol  17 g Oral Daily   QUEtiapine  25 mg Oral QHS   torsemide  20 mg Oral Daily   warfarin  3 mg Oral ONCE-1600   Warfarin - Pharmacist Dosing Inpatient   Does not apply q1600   Continuous Infusions:  sodium chloride 10 mL/hr at 12/18/20 0842     LOS: 2 days    Time spent: 30 minutes.   Barton Dubois, MD [facility]   To contact the attending provider between 7A-7P or the covering provider during after hours 7P-7A, please log into the web site [email or web address] and access using universal [place] password for that web site. If you do not have the password, please call the hospital operator.  12/20/2020, 12:57 PM

## 2020-12-21 DIAGNOSIS — E785 Hyperlipidemia, unspecified: Secondary | ICD-10-CM

## 2020-12-21 DIAGNOSIS — I10 Essential (primary) hypertension: Secondary | ICD-10-CM | POA: Diagnosis not present

## 2020-12-21 DIAGNOSIS — I5032 Chronic diastolic (congestive) heart failure: Secondary | ICD-10-CM | POA: Diagnosis not present

## 2020-12-21 DIAGNOSIS — Z952 Presence of prosthetic heart valve: Secondary | ICD-10-CM | POA: Diagnosis not present

## 2020-12-21 DIAGNOSIS — S3282XA Multiple fractures of pelvis without disruption of pelvic ring, initial encounter for closed fracture: Secondary | ICD-10-CM | POA: Diagnosis not present

## 2020-12-21 DIAGNOSIS — N1832 Chronic kidney disease, stage 3b: Secondary | ICD-10-CM

## 2020-12-21 DIAGNOSIS — E1169 Type 2 diabetes mellitus with other specified complication: Secondary | ICD-10-CM

## 2020-12-21 LAB — PROTIME-INR
INR: 2.5 — ABNORMAL HIGH (ref 0.8–1.2)
Prothrombin Time: 26.7 seconds — ABNORMAL HIGH (ref 11.4–15.2)

## 2020-12-21 MED ORDER — WARFARIN SODIUM 2 MG PO TABS: 3.0000 mg | ORAL_TABLET | Freq: Once | ORAL | Status: DC

## 2020-12-21 MED ORDER — HYDROCODONE-ACETAMINOPHEN 10-325 MG PO TABS: 1.0000 | ORAL_TABLET | Freq: Four times a day (QID) | ORAL | 0 refills | Status: DC | PRN

## 2020-12-21 MED ORDER — SPIRONOLACTONE 100 MG PO TABS: 50.0000 mg | ORAL_TABLET | Freq: Every day | ORAL | Status: DC

## 2020-12-21 MED ORDER — BISACODYL 10 MG RE SUPP: 10.0000 mg | Freq: Every day | RECTAL | Status: DC | PRN

## 2020-12-21 NOTE — Progress Notes (Signed)
Called report to [place] at [facility].

## 2020-12-21 NOTE — Progress Notes (Signed)
ANTICOAGULATION CONSULT NOTE -   Pharmacy Consult for warfarin Indication: mechanical heart valve, Afib  Allergies  Allergen Reactions   Tape Itching    Redness, Please use "paper" tape only    Patient Measurements: Height: '5\' 7"'$  (170.2 cm) Weight: 54.4 kg (120 lb) IBW/kg (Calculated) : 61.6  Vital Signs: Temp: 97.8 F (36.6 C) (10/04 0517) Temp Source: Oral (10/04 0517) BP: 131/62 (10/04 0517) Pulse Rate: 95 (10/04 0517)  Labs: Recent Labs    12/19/20 0452 12/20/20 0548 12/21/20 0536  HGB 8.5* 8.5*  --   HCT 27.8* 28.1*  --   PLT 130* 125*  --   LABPROT 35.2* 31.2* 26.7*  INR 3.5* 3.0* 2.5*  CREATININE 1.28*  --   --      Estimated Creatinine Clearance: 29.1 mL/min (A) (by C-G formula based on SCr of 1.28 mg/dL (H)).   Medical History: Past Medical History:  Diagnosis Date   Anxiety    Diabetes mellitus with neuropathy (HCC)    Diabetic neuropathy (HCC)    DJD (degenerative joint disease)    H/O aortic valve replacement    Hyperlipidemia    Hypertension    Neuromuscular disorder ([place])    neuropathy in feet   Obesity    Sleep apnea    Stroke ([place])    " light stroke"   Wears glasses     Medications:  Medications Prior to Admission  Medication Sig Dispense Refill Last Dose   acetaminophen (TYLENOL) 500 MG tablet Take 500 mg by mouth 2 (two) times daily as needed for mild pain or moderate pain.       Ascorbic Acid (VITAMIN C) 100 MG tablet Take 100 mg by mouth 2 (two) times daily.   12/18/2020   bisacodyl (DULCOLAX) 5 MG EC tablet Take 5 mg by mouth daily as needed for moderate constipation.      buPROPion (WELLBUTRIN SR) 150 MG 12 hr tablet Take 150 mg by mouth daily.   12/18/2020   busPIRone (BUSPAR) 5 MG tablet Take 5 mg by mouth 2 (two) times daily.   12/18/2020   cetirizine (ZYRTEC) 10 MG tablet Take 10 mg by mouth at bedtime as needed for allergies.   11 12/18/2020   docusate sodium (COLACE) 100 MG capsule Take 1 capsule (100 mg total) by mouth 2  (two) times daily. 10 capsule 0 12/18/2020   fluticasone (FLONASE) 50 MCG/ACT nasal spray Place 1 spray into both nostrils as needed.   11 12/18/2020   lactulose (CHRONULAC) 10 GM/15ML solution Take 30 mLs (20 g total) by mouth daily. 236 mL 0 12/18/2020   methocarbamol (ROBAXIN) 500 MG tablet Take 1 tablet (500 mg total) by mouth every 6 (six) hours as needed for muscle spasms.      oxyCODONE-acetaminophen (PERCOCET) 5-325 MG tablet Take 1-2 tablets by mouth every 8 (eight) hours as needed for severe pain. 30 tablet 0 12/18/2020   pantoprazole (PROTONIX) 40 MG tablet Take 1 tablet (40 mg total) by mouth daily before breakfast. 30 tablet 5 12/18/2020   QUEtiapine (SEROQUEL) 25 MG tablet Take 25 mg by mouth at bedtime.   Past Week   simvastatin (ZOCOR) 40 MG tablet Take 40 mg by mouth at bedtime.   Past Week   sodium bicarbonate 650 MG tablet Take 650 mg by mouth 3 (three) times daily.   12/18/2020   torsemide (DEMADEX) 20 MG tablet Take 20 mg by mouth daily.   12/18/2020   vitamin B-12 (CYANOCOBALAMIN) 500 MCG tablet Take  500 mcg by mouth daily.   12/18/2020   warfarin (COUMADIN) 3 MG tablet Take 1 tablet (3 mg total) by mouth daily. (Patient taking differently: Take 3 mg by mouth daily. On Monday Wed and Friday)   Past Week   zinc sulfate 220 (50 Zn) MG capsule Take 220 mg by mouth daily.   12/18/2020   [DISCONTINUED] HYDROcodone-acetaminophen (NORCO) 10-325 MG tablet Take 1 tablet by mouth 2 (two) times daily.   12/18/2020   [DISCONTINUED] spironolactone (ALDACTONE) 100 MG tablet Take 100 mg by mouth daily.   12/18/2020    Assessment: Pharmacy consulted to dose warfarin in patient with mechanical AVR and Afib.  Patient's home dose listed as 3 mg daily. Right pubic ramus fracture, no surgical intervention needed.  Hgb  8.5 stable  INR 2.5, therapeutic Dose of '3mg'$  not given yesterday. Will reorder  Goal of Therapy:  INR goal 2.5-3.5   mechanical valve replacement Monitor platelets by anticoagulation  protocol: Yes   Plan:  Warfarin 3 mg x 1 dose Monitor daily INR and s/s of bleeding.  Isac Sarna, BS Pharm D, BCPS Clinical Pharmacist Pager [phone] 12/21/2020 1:17 PM

## 2020-12-21 NOTE — Progress Notes (Signed)
Made first attempt to call report to [facility], no answer.

## 2020-12-21 NOTE — TOC Transition Note (Signed)
Transition of Care [facility]) - CM/SW Discharge Note   Patient Details  Name: RANDALYN ELLERS MRN: [id number] Date of Birth: December 25, 1940  [facility] CM/SW Contact:  Salome Arnt, Mabel Phone Number: 12/21/2020, 9:44 AM   Clinical Narrative:  Pt d/c today to [facility]. Pt, husband, and facility aware and agreeable. Per admissions at [facility], auth received. COVID negative 12/18/20. D/C summary sent to SNF. RN given number to call report. Pt will transport via [facility].      Final next level of care: Skilled Nursing Facility Barriers to Discharge: Barriers Resolved   Patient Goals and CMS Choice Patient states their goals for this hospitalization and ongoing recovery are:: Go to rehab CMS Medicare.gov Compare Post Acute Care list provided to:: Patient Choice offered to / list presented to : Patient  Discharge Placement              Patient chooses bed at: [facility] Patient to be transferred to facility by: [facility] Name of family member notified: pt/husband Patient and family notified of of transfer: 12/21/20  Discharge Plan and Services In-house Referral: Clinical Social Work Discharge Planning Services: CM Consult Post Acute Care Choice: [place]          DME Arranged: N/A                    Social Determinants of Health (SDOH) Interventions     Readmission Risk Interventions Readmission Risk Prevention Plan 12/19/2020  Transportation Screening Complete  Medication Review Press photographer) Complete  HRI or Home Care Consult Complete  SW Recovery Care/Counseling Consult Complete  Palliative Care Screening Not Applicable  Skilled Nursing Facility Complete  Some recent data might be hidden

## 2020-12-21 NOTE — Discharge Summary (Signed)
Physician Discharge Summary  Mary [name] [id number] DOB: 04-22-1940 DOA: 12/18/2020  PCP: Bonnita Nasuti, MD  Admit date: 12/18/2020 Discharge date: 12/21/2020  Time spent: 35 minutes  Recommendations for Outpatient Follow-up:  Repeat basic metabolic panel in 1 week to follow electrolytes and renal function Repeat CBC in 1 week to follow hemoglobin trend and instability. Close monitoring of patient's Coumadin level with INR goal 2.5-3.5 Physical therapy rehabilitation and conditioning as per the skilled nursing facility protocol. Follow heart healthy diet. Weightbearing as tolerated.   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  Discharge Condition: Stable and improved.  Discharge to skilled nursing facility for further care and rehabilitation.  CODE STATUS: Full code  Diet recommendation: Heart healthy diet  Filed Weights   12/18/20 0802  Weight: 54.4 kg    History of present illness:  Mary [name] is a 80 y.o. female with medical history significant of dementia, hypertension, hyperlipidemia, chronic kidney disease stage IIIb, anxiety/depression, prior history of stroke and mechanical valve replacement; who presented to the hospital secondary to mechanical fall with subsequent right hip pain.  Patient reported no loss of consciousness, dizziness, chest pain, hematuria, shortness of breath, nausea, vomiting, focal weakness or any other complaints.  She expressed tripping with her walker and landing on her right side.  Patient ended up injuring her right elbow and  expressed hitting the back of her head.   COVID PCR was negative in the ED; patient is vaccinated against COVID.   ED Course: CT head without acute intracranial process, right elbow demonstrating no fracture and hip images with positive right pubic ramus fracture; no hip fracture.  Patient experiencing inability to bear weight and complaining of significant pain.  Hospital Course:  1-right pubic ramus fracture -Appreciate recommendations by orthopedic service; no surgical intervention needed -Continue weightbearing as tolerated. -Continue pain management. -Will closely follow patient hemoglobin trend, so far stable; Current hemoglobin 8.5. -Patient has been accepted at [facility] authorization.  Hopefully discharge later today to SNF for further care and rehab.   2-hypertension/chronic diastolic heart failure -Continue current antihypertensive regimen -Blood pressure stable -Heart healthy diet has been ordered. -Continue to follow daily weights -Continue outpatient follow-up with cardiology service.   3-gastroesophageal reflux disease -Continue PPI.   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 -Mood overall stable -Continue the use of Wellbutrin and BuSpar -Continue nightly Seroquel.   5-history of chronic kidney disease a stage IIIb -Appears to be stable and at baseline -Continue minimizing the use of nephrotoxic agents -Continue to follow renal function trend. -Maintain adequate hydration.   6-mechanical valve replacement -INR goal 2.5-3.5 -Currently therapeutic -Continue to closely follow Coumadin level. -Continue to follow hemoglobin trend.   7-anemia of chronic kidney disease -With some decrease in patient's hemoglobin in the setting of hemodilution. -No signs of overt bleeding -Continue to follow hemoglobin trend intermittently. -Patient is hemodynamically stable.   8-chronic  constipation -Continue treatment with MiraLAX, lactulose and Colace. -Maintain adequate hydration and increase fiber intake.  Procedures: See below for x-ray reports  Consultations: Orthopedic service  Discharge Exam: Vitals:   12/20/20 2051 12/21/20 0517  BP: (!) 125/57  131/62  Pulse: 99 95  Resp: 18 18  Temp: 98.9 F (37.2 C) 97.8 F (36.6 C)  SpO2: 99% 98%   General exam: Alert, awake, following commands appropriately; no chest pain, no nausea, no vomiting, no palpitations.  Patient continue reporting pain on her right hip with movement. Respiratory system: Clear to auscultation. Respiratory effort normal.  Good air movement bilaterally.  No using accessory muscle. Cardiovascular system:RRR. No murmurs, rubs, gallops. Gastrointestinal system: Abdomen is nondistended, soft and nontender. No organomegaly or masses felt. Normal bowel sounds heard. Central nervous system: Alert and oriented. No focal neurological deficits. Extremities: No cyanosis or clubbing.  Decreased range of motion appreciated in her right lower extremity secondary to pain.  Right elbow with bruise/abrasion from recent fall.  No signs of superimposed infection. Skin: No petechiae. Psychiatry: Mood & affect appropriate.    Discharge Instructions   Discharge Instructions     Diet - low sodium heart healthy   Complete by: As directed    Discharge instructions   Complete by: As directed    Repeat basic metabolic panel in 1 week to follow electrolytes and renal function Repeat CBC in 1 week to follow hemoglobin trend and instability. Close monitoring of patient's Coumadin level with INR goal 2.5-3.5 Physical therapy rehabilitation and conditioning as per the skilled nursing facility protocol. Follow heart healthy diet. Weightbearing as tolerated.      Allergies as of 12/21/2020       Reactions   Tape Itching   Redness, Please use "paper" tape only        Medication List     STOP taking these  medications    bisacodyl 5 MG EC tablet Commonly known as: DULCOLAX Replaced by: bisacodyl 10 MG suppository   oxyCODONE-acetaminophen 5-325 MG tablet Commonly known as: Percocet       TAKE these medications    acetaminophen 500 MG tablet Commonly known as: TYLENOL Take 500 mg by mouth 2 (two) times daily as needed for mild pain or moderate pain.   bisacodyl 10 MG suppository Commonly known as: DULCOLAX Place 1 suppository (10 mg total) rectally daily as needed for moderate constipation. Replaces: bisacodyl 5 MG EC tablet   buPROPion 150 MG 12 hr tablet Commonly known as: WELLBUTRIN SR Take 150 mg by mouth daily.   busPIRone 5 MG tablet Commonly known as: BUSPAR Take 5 mg by mouth 2 (two) times daily.   cetirizine 10 MG tablet Commonly known as: ZYRTEC Take 10 mg by mouth at bedtime as needed for allergies.   docusate sodium 100 MG capsule Commonly known as: COLACE Take 1 capsule (100 mg total) by mouth 2 (two) times daily.   fluticasone 50 MCG/ACT nasal spray Commonly known as: FLONASE Place 1 spray into both nostrils as needed.   HYDROcodone-acetaminophen 10-325 MG tablet Commonly known as: NORCO Take 1 tablet by mouth every 6 (six) hours as needed for severe pain. What changed:  when to take this reasons to take this   lactulose 10 GM/15ML solution Commonly known as: CHRONULAC Take 30 mLs (20 g total) by mouth daily.   methocarbamol 500 MG tablet Commonly known as: ROBAXIN Take 1 tablet (500 mg total) by mouth every 6 (six) hours as needed for muscle spasms.   pantoprazole 40 MG tablet Commonly known as: PROTONIX Take 1 tablet (40 mg total) by mouth daily before breakfast.   QUEtiapine 25 MG tablet Commonly known as: SEROQUEL Take 25 mg by mouth at bedtime.   simvastatin 40  MG tablet Commonly known as: ZOCOR Take 40 mg by mouth at bedtime.   sodium bicarbonate 650 MG tablet Take 650 mg by mouth 3 (three) times daily.   spironolactone 100 MG  tablet Commonly known as: ALDACTONE Take 0.5 tablets (50 mg total) by mouth daily. What changed: how much to take   torsemide 20 MG tablet Commonly known as: DEMADEX Take 20 mg by mouth daily.   vitamin B-12 500 MCG tablet Commonly known as: CYANOCOBALAMIN Take 500 mcg by mouth daily.   vitamin C 100 MG tablet Take 100 mg by mouth 2 (two) times daily.   warfarin 3 MG tablet Commonly known as: COUMADIN Take 1 tablet (3 mg total) by mouth daily. What changed: additional instructions   zinc sulfate 220 (50 Zn) MG capsule Take 220 mg by mouth daily.       Allergies  Allergen Reactions   Tape Itching    Redness, Please use "paper" tape only    Contact information for follow-up providers     Hague, Rosalyn Charters, MD. Schedule an appointment as soon as possible for a visit.   Specialty: Internal Medicine Why: In 10 days after discharge from the skilled nursing facility Contact information: [place] [address] [phone]         Arnoldo Lenis, MD .   Specialty: Cardiology Contact information: [address] [place] [address] [phone]              Contact information for after-discharge care     Destination     [facility] .   Service: Skilled Nursing Contact information: [place] [phone]                    The results of significant diagnostics from this hospitalization (including imaging, microbiology, ancillary and laboratory) are listed below for reference.    Significant Diagnostic Studies: DG Elbow Complete Right  Result Date: 12/18/2020 CLINICAL DATA:  Fall.  Skin tear.  Pain. EXAM: RIGHT ELBOW - COMPLETE 3+ VIEW COMPARISON:  None. FINDINGS: The lateral view is limited due to positioning. No fracture identified. IMPRESSION: The study is limited due to positioning the lateral view. Within this limitation, no fracture identified. If there is continued  concern, recommend repeating the lateral view. Electronically Signed   By: Dorise Bullion III M.D.   On: 12/18/2020 09:51   CT HEAD WO CONTRAST  Result Date: 12/18/2020 CLINICAL DATA:  Head trauma, minor EXAM: CT HEAD WITHOUT CONTRAST TECHNIQUE: Contiguous axial images were obtained from the base of the skull through the vertex without intravenous contrast. COMPARISON:  08/11/2020 FINDINGS: Brain: Chronic small vessel ischemia in the hemispheric white matter. Small remote right cerebellar infarcts. No hemorrhage, hydrocephalus, or masslike finding. Generalized brain atrophy. Vascular: No hyperdense vessel or unexpected calcification. Skull: Normal. Negative for fracture or focal lesion. Sinuses/Orbits: No acute finding. Chronic right maxillary sinusitis with calcified debris, partially covered IMPRESSION: Aging brain.  No acute or interval finding. Electronically Signed   By: Jorje Guild M.D.   On: 12/18/2020 09:29   NM Renal Imaging Flow W/Pharm  Result Date: 12/02/2020 CLINICAL DATA:  Abnormal ultrasound RIGHT kidney. EXAM: NUCLEAR MEDICINE RENAL SCAN WITH DIURETIC ADMINISTRATION TECHNIQUE: Radionuclide angiographic and sequential renal images were obtained after intravenous injection of radiopharmaceutical. Imaging was continued during slow intravenous injection of Lasix approximately 15 minutes after the start of the examination. RADIOPHARMACEUTICALS:  5.5 mCi Technetium-66mMAG3 IV  COMPARISON:  None. FINDINGS: Flow:  Prompt symmetric arterial flow to the kidneys. Left renogram: Uniform uptake of counts in the renal cortex. Counts are promptly excreted into the collecting system and cleared prior to administration of Lasix. Lasix augment clearance. No postvoid residual. Right renogram: The RIGHT kidney is small compared to the LEFT kidney. Normal renal cortical uptake. Prompt excretion into the RIGHT renal collecting system and clearance of counts from the collecting system prior to administration  of Lasix. Lasix augments clearing. Postvoid residual. Differential: Left kidney = 70 % Right kidney = 30 % T1/2 post Lasix : Left kidney = 13.1 min (significant clearance prior to to administration of Lasix) Right kidney = 12.2 min (significant clearance prior to administration Lasix) IMPRESSION: 1. The RIGHT kidney is smaller than LEFT with corresponding asymmetric differential. No evidence of obstruction or hydronephrosis. 2. Normal LEFT kidney. Electronically Signed   By: Suzy Bouchard M.D.   On: 12/02/2020 11:14   CT Hip Right Wo Contrast  Result Date: 12/18/2020 CLINICAL DATA:  Right hip pain after fall. EXAM: CT OF THE RIGHT HIP WITHOUT CONTRAST TECHNIQUE: Multidetector CT imaging of the right hip was performed according to the standard protocol. Multiplanar CT image reconstructions were also generated. COMPARISON:  Right hip x-rays from same day. CT right hip dated December 08, 2019. FINDINGS: Bones/Joint/Cartilage Acute nondisplaced fracture of the right inferior pubic ramus (series 3, image 85). Acute nondisplaced fracture of the inferior right sacral ala (series 3, image 18). Old healed fracture of the right greater trochanter. Mild right hip osteoarthritis. No joint effusion. Ligaments Ligaments are suboptimally evaluated by CT. Muscles and Tendons Grossly intact. Soft tissue No fluid collection or hematoma.  No soft tissue mass. IMPRESSION: 1. Acute nondisplaced fracture of the right inferior pubic ramus. 2. Acute nondisplaced fracture of the inferior right sacral ala. Electronically Signed   By: Titus Dubin M.D.   On: 12/18/2020 10:55   DG Chest Port 1 View  Result Date: 12/18/2020 CLINICAL DATA:  Fall.  Suspected hip fracture. EXAM: PORTABLE CHEST 1 VIEW COMPARISON:  Aug 11, 2020 FINDINGS: No pneumothorax. Stable cardiomegaly. The hila and mediastinum are unremarkable. Small right effusion with underlying atelectasis. No other interval changes. IMPRESSION: Cardiomegaly. Small right  effusion with underlying atelectasis. No other interval changes. Electronically Signed   By: Dorise Bullion III M.D.   On: 12/18/2020 09:49   DG Hip Unilat With Pelvis 2-3 Views Right  Result Date: 12/18/2020 CLINICAL DATA:  Fall on Coumadin.  Suspected hip fracture. EXAM: DG HIP (WITH OR WITHOUT PELVIS) 2-3V RIGHT COMPARISON:  None. FINDINGS: There is no evidence of hip fracture or dislocation. There is no evidence of arthropathy or other focal bone abnormality. IMPRESSION: Negative. Electronically Signed   By: Dorise Bullion III M.D.   On: 12/18/2020 09:48    Microbiology: Recent Results (from the past 240 hour(s))  Resp Panel by RT-PCR (Flu A&B, Covid) Nasopharyngeal Swab     Status: None   Collection Time: 12/18/20 12:53 PM   Specimen: Nasopharyngeal Swab; Nasopharyngeal(NP) swabs in vial transport medium  Result Value Ref Range Status   SARS Coronavirus 2 by RT PCR NEGATIVE NEGATIVE Final    Comment: (NOTE) SARS-CoV-2 target nucleic acids are NOT DETECTED.  The SARS-CoV-2 RNA is generally detectable in upper respiratory specimens during the acute phase of infection. The lowest concentration of SARS-CoV-2 viral copies this assay can detect is 138 copies/mL. A negative result does not preclude SARS-Cov-2 infection and should not be used as  the sole basis for treatment or other patient management decisions. A negative result may occur with  improper specimen collection/handling, submission of specimen other than nasopharyngeal swab, presence of viral mutation(s) within the areas targeted by this assay, and inadequate number of viral copies(<138 copies/mL). A negative result must be combined with clinical observations, patient history, and epidemiological information. The expected result is Negative.  Fact Sheet for Patients:  EntrepreneurPulse.com.au  Fact Sheet for Healthcare Providers:  IncredibleEmployment.be  This test is no t yet approved  or cleared by the [place] FDA and  has been authorized for detection and/or diagnosis of SARS-CoV-2 by FDA under an Emergency Use Authorization (EUA). This EUA will remain  in effect (meaning this test can be used) for the duration of the COVID-19 declaration under Section 564(b)(1) of the Act, 21 U.S.C.section 360bbb-3(b)(1), unless the authorization is terminated  or revoked sooner.       Influenza A by PCR NEGATIVE NEGATIVE Final   Influenza B by PCR NEGATIVE NEGATIVE Final    Comment: (NOTE) The Xpert Xpress SARS-CoV-2/FLU/RSV plus assay is intended as an aid in the diagnosis of influenza from Nasopharyngeal swab specimens and should not be used as a sole basis for treatment. Nasal washings and aspirates are unacceptable for Xpert Xpress SARS-CoV-2/FLU/RSV testing.  Fact Sheet for Patients: EntrepreneurPulse.com.au  Fact Sheet for Healthcare Providers: IncredibleEmployment.be  This test is not yet approved or cleared by the [place] FDA and has been authorized for detection and/or diagnosis of SARS-CoV-2 by FDA under an Emergency Use Authorization (EUA). This EUA will remain in effect (meaning this test can be used) for the duration of the COVID-19 declaration under Section 564(b)(1) of the Act, 21 U.S.C. section 360bbb-3(b)(1), unless the authorization is terminated or revoked.  Performed at [facility], [address] [place], [place] [address]      Labs: Basic Metabolic Panel: Recent Labs  Lab 12/18/20 0816 12/19/20 0452  NA 136 136  K 3.7 3.8  CL 102 106  CO2 26 23  GLUCOSE 96 86  BUN 21 19  CREATININE 1.26* 1.28*  CALCIUM 8.7* 8.4*  MG 2.1  --   PHOS 2.9  --    CBC: Recent Labs  Lab 12/18/20 0816 12/19/20 0452 12/20/20 0548  WBC 5.9 5.2 5.2  NEUTROABS 3.7  --   --   HGB 10.1* 8.5* 8.5*  HCT 32.1* 27.8* 28.1*  MCV 107.7* 107.8* 110.2*  PLT 137* 130* 125*   BNP (last 3 results) Recent Labs     01/15/20 1325  BNP 193.0*    Signed:  Barton Dubois MD.  [facility] 12/21/2020, 8:35 AM

## 2021-01-05 ENCOUNTER — Ambulatory Visit: Payer: Medicare Other | Admitting: Cardiology

## 2021-05-26 ENCOUNTER — Emergency Department (HOSPITAL_COMMUNITY): Payer: Medicare Other

## 2021-05-26 ENCOUNTER — Emergency Department (HOSPITAL_COMMUNITY)
Admission: EM | Admit: 2021-05-26 | Discharge: 2021-05-26 | Disposition: A | Discharge status: Skilled Nursing Facility | Payer: Medicare Other | Attending: Emergency Medicine | Admitting: Emergency Medicine

## 2021-05-26 ENCOUNTER — Encounter (HOSPITAL_COMMUNITY): Payer: Self-pay | Admitting: *Deleted

## 2021-05-26 ENCOUNTER — Other Ambulatory Visit: Payer: Self-pay

## 2021-05-26 DIAGNOSIS — Z7901 Long term (current) use of anticoagulants: Secondary | ICD-10-CM | POA: Diagnosis not present

## 2021-05-26 DIAGNOSIS — M542 Cervicalgia: Secondary | ICD-10-CM | POA: Insufficient documentation

## 2021-05-26 DIAGNOSIS — Y92129 Unspecified place in nursing home as the place of occurrence of the external cause: Secondary | ICD-10-CM | POA: Diagnosis not present

## 2021-05-26 DIAGNOSIS — W19XXXA Unspecified fall, initial encounter: Secondary | ICD-10-CM

## 2021-05-26 DIAGNOSIS — S0083XA Contusion of other part of head, initial encounter: Secondary | ICD-10-CM | POA: Diagnosis not present

## 2021-05-26 DIAGNOSIS — S0990XA Unspecified injury of head, initial encounter: Secondary | ICD-10-CM | POA: Diagnosis present

## 2021-05-26 DIAGNOSIS — W06XXXA Fall from bed, initial encounter: Secondary | ICD-10-CM | POA: Diagnosis not present

## 2021-05-26 HISTORY — DX: Vitamin D deficiency, unspecified: E55.9

## 2021-05-26 HISTORY — DX: Anemia in chronic kidney disease: N18.9

## 2021-05-26 HISTORY — DX: Unspecified dementia, unspecified severity, with other behavioral disturbance: F03.918

## 2021-05-26 HISTORY — DX: Gastro-esophageal reflux disease without esophagitis: K21.9

## 2021-05-26 HISTORY — DX: Chronic kidney disease, unspecified: D63.1

## 2021-05-26 LAB — PROTIME-INR
INR: 1.8 — ABNORMAL HIGH (ref 0.8–1.2)
Prothrombin Time: 20.9 seconds — ABNORMAL HIGH (ref 11.4–15.2)

## 2021-05-26 NOTE — ED Provider Notes (Signed)
[facility] EMERGENCY DEPARTMENT Provider Note   CSN: [id number] Arrival date & time: 05/26/21  0700     History  Chief Complaint  Patient presents with   Mary [name] is a 81 y.o. female.   Fall Patient presents after fall.  [place] out of bed and hit her head on the nightstand.  No loss conscious.  States her neck does feel little tight.  Is on Coumadin for mechanical valve.  No numbness or weakness.  No other complaints.  Is awake and pleasant.     Home Medications Prior to Admission medications   Medication Sig Start Date End Date Taking? Authorizing Provider  acetaminophen (TYLENOL) 500 MG tablet Take 500 mg by mouth 2 (two) times daily as needed for mild pain or moderate pain.    Yes [provider]  Amino Acids-Protein Hydrolys (FEEDING SUPPLEMENT, PRO-STAT SUGAR FREE 64,) LIQD Take 30 mLs by mouth daily.   Yes [provider]  Ascorbic Acid (VITAMIN C) 100 MG tablet Take 100 mg by mouth 2 (two) times daily.   Yes [provider]  bisacodyl (DULCOLAX) 10 MG suppository Place 1 suppository (10 mg total) rectally daily as needed for moderate constipation. 12/21/20  Yes Barton Dubois, MD  buPROPion [facility] SR) 150 MG 12 hr tablet Take 150 mg by mouth daily. 01/07/20  Yes [provider]  busPIRone (BUSPAR) 10 MG tablet Take 5 mg by mouth 2 (two) times daily.   Yes [provider]  cetirizine (ZYRTEC) 10 MG tablet Take 10 mg by mouth at bedtime as needed for allergies.  02/16/15  Yes [provider]  docusate sodium (COLACE) 100 MG capsule Take 1 capsule (100 mg total) by mouth 2 (two) times daily. 08/19/20  Yes Rai, Ripudeep K, MD  fluticasone (FLONASE) 50 MCG/ACT nasal spray Place 1 spray into both nostrils as needed for allergies. 01/13/15  Yes [provider]  HYDROcodone-acetaminophen (NORCO) 10-325 MG tablet Take 1 tablet by mouth every 6 (six) hours as needed for severe pain. 12/21/20  Yes Barton Dubois,  MD  lactulose (CHRONULAC) 10 GM/15ML solution Take 30 mLs (20 g total) by mouth daily. 11/03/19  Yes Donne Hazel, MD  methocarbamol (ROBAXIN) 500 MG tablet Take 1 tablet (500 mg total) by mouth every 6 (six) hours as needed for muscle spasms. 08/19/20  Yes Rai, Ripudeep K, MD  Multiple Vitamins-Minerals (MULTIVITAMIN WITH MINERALS) tablet Take 1 tablet by mouth daily.   Yes [provider]  pantoprazole (PROTONIX) 40 MG tablet Take 1 tablet (40 mg total) by mouth daily before breakfast. 03/21/16  Yes Rehman, Mechele Dawley, MD  polyethylene glycol (MIRALAX / GLYCOLAX) 17 g packet Take 17 g by mouth daily.   Yes [provider]  QUEtiapine (SEROQUEL) 25 MG tablet Take 25 mg by mouth at bedtime. 12/07/20  Yes [provider]  simvastatin (ZOCOR) 40 MG tablet Take 40 mg by mouth at bedtime. 12/07/20  Yes [provider]  sodium bicarbonate 650 MG tablet Take 650 mg by mouth 3 (three) times daily.   Yes [provider]  spironolactone (ALDACTONE) 100 MG tablet Take 0.5 tablets (50 mg total) by mouth daily. 12/21/20  Yes Barton Dubois, MD  torsemide (DEMADEX) 20 MG tablet Take 20 mg by mouth 2 (two) times daily.   Yes [provider]  vitamin B-12 (CYANOCOBALAMIN) 500 MCG tablet Take 500 mcg by mouth daily.   Yes [provider]  warfarin (COUMADIN) 3 MG  tablet Take 1 tablet (3 mg total) by mouth daily. 08/19/20  Yes Rai, Ripudeep K, MD  zinc sulfate 220 (50 Zn) MG capsule Take 220 mg by mouth daily.   Yes [provider]      Allergies    Tape    Review of Systems   Review of Systems  Constitutional:  Negative for appetite change.  Musculoskeletal:  Positive for neck pain.  Hematological:  Bruises/bleeds easily.   Physical Exam Updated Vital Signs BP (!) 117/52    Pulse 85    Temp 98.3 F (36.8 C) (Oral)    Resp 18    Ht 5\' 7"  (1.702 m)    Wt 54.4 kg    SpO2 95%    BMI 18.78 kg/m  Physical Exam Vitals and nursing note reviewed.   HENT:     Head:     Comments: Hematoma to left forehead and small hematoma to right forehead. Eyes:     Extraocular Movements: Extraocular movements intact.  Chest:     Chest wall: No tenderness.  Abdominal:     Tenderness: There is no abdominal tenderness.  Musculoskeletal:        General: No tenderness.     Cervical back: Neck supple. No tenderness.  Skin:    General: Skin is warm.  Neurological:     Mental Status: She is alert.     Comments: Patient is awake pleasant and joking.    ED Results / Procedures / Treatments   Labs (all labs ordered are listed, but only abnormal results are displayed) Labs Reviewed  PROTIME-INR - Abnormal; Notable for the following components:      Result Value   Prothrombin Time 20.9 (*)    INR 1.8 (*)    All other components within normal limits    EKG None  Radiology CT Head Wo Contrast  Result Date: 05/26/2021 CLINICAL DATA:  81 year old female with history of trauma to the head and neck after rolling out of bed this morning. EXAM: CT HEAD WITHOUT CONTRAST CT CERVICAL SPINE WITHOUT CONTRAST TECHNIQUE: Multidetector CT imaging of the head and cervical spine was performed following the standard protocol without intravenous contrast. Multiplanar CT image reconstructions of the cervical spine were also generated. RADIATION DOSE REDUCTION: This exam was performed according to the departmental dose-optimization program which includes automated exposure control, adjustment of the mA and/or kV according to patient size and/or use of iterative reconstruction technique. COMPARISON:  Head CT 12/18/2020. FINDINGS: CT HEAD FINDINGS Brain: Moderate cerebral and cerebellar atrophy. Patchy and confluent areas of decreased attenuation are noted throughout the deep and periventricular white matter of the cerebral hemispheres bilaterally, compatible with chronic microvascular ischemic disease. No evidence of acute infarction, hemorrhage, hydrocephalus, extra-axial  collection or mass lesion/mass effect. Vascular: No hyperdense vessel or unexpected calcification. Skull: Normal. Negative for fracture or focal lesion. Sinuses/Orbits: No acute finding. Mucoperiosteal thickening and extensive inspissated secretions in the right maxillary sinus, similar to prior studies, indicative of chronic sinusitis. Other: Small amount of soft tissue swelling in the left frontal scalp, presumably a small contusion. CT CERVICAL SPINE FINDINGS Alignment: Normal. Skull base and vertebrae: Complete bony fusion from C5-C7. No acute fracture. No primary bone lesion or focal pathologic process. Soft tissues and spinal canal: No prevertebral fluid or swelling. No visible canal hematoma. Disc levels: Complete bony fusion at C5-C6 and C6-C7, likely postoperative (no indwelling hardware is noted at this time). Multilevel degenerative disc disease, most pronounced at C3-C4 and T1-T2. Moderate multilevel  facet arthropathy. Upper chest: Unremarkable. Other: None. IMPRESSION: 1. No evidence of significant acute traumatic injury to the skull, brain or cervical spine. 2. Moderate cerebral and cerebellar atrophy with extensive chronic microvascular ischemic changes in the cerebral white matter, as above. 3. Multilevel degenerative disc disease and cervical spondylosis, with presumed postoperative fusion from C5-C7. 4. Chronic right maxillary sinusitis again noted. Electronically Signed   By: Vinnie Langton M.D.   On: 05/26/2021 08:56   CT Cervical Spine Wo Contrast  Result Date: 05/26/2021 CLINICAL DATA:  81 year old female with history of trauma to the head and neck after rolling out of bed this morning. EXAM: CT HEAD WITHOUT CONTRAST CT CERVICAL SPINE WITHOUT CONTRAST TECHNIQUE: Multidetector CT imaging of the head and cervical spine was performed following the standard protocol without intravenous contrast. Multiplanar CT image reconstructions of the cervical spine were also generated. RADIATION DOSE  REDUCTION: This exam was performed according to the departmental dose-optimization program which includes automated exposure control, adjustment of the mA and/or kV according to patient size and/or use of iterative reconstruction technique. COMPARISON:  Head CT 12/18/2020. FINDINGS: CT HEAD FINDINGS Brain: Moderate cerebral and cerebellar atrophy. Patchy and confluent areas of decreased attenuation are noted throughout the deep and periventricular white matter of the cerebral hemispheres bilaterally, compatible with chronic microvascular ischemic disease. No evidence of acute infarction, hemorrhage, hydrocephalus, extra-axial collection or mass lesion/mass effect. Vascular: No hyperdense vessel or unexpected calcification. Skull: Normal. Negative for fracture or focal lesion. Sinuses/Orbits: No acute finding. Mucoperiosteal thickening and extensive inspissated secretions in the right maxillary sinus, similar to prior studies, indicative of chronic sinusitis. Other: Small amount of soft tissue swelling in the left frontal scalp, presumably a small contusion. CT CERVICAL SPINE FINDINGS Alignment: Normal. Skull base and vertebrae: Complete bony fusion from C5-C7. No acute fracture. No primary bone lesion or focal pathologic process. Soft tissues and spinal canal: No prevertebral fluid or swelling. No visible canal hematoma. Disc levels: Complete bony fusion at C5-C6 and C6-C7, likely postoperative (no indwelling hardware is noted at this time). Multilevel degenerative disc disease, most pronounced at C3-C4 and T1-T2. Moderate multilevel facet arthropathy. Upper chest: Unremarkable. Other: None. IMPRESSION: 1. No evidence of significant acute traumatic injury to the skull, brain or cervical spine. 2. Moderate cerebral and cerebellar atrophy with extensive chronic microvascular ischemic changes in the cerebral white matter, as above. 3. Multilevel degenerative disc disease and cervical spondylosis, with presumed  postoperative fusion from C5-C7. 4. Chronic right maxillary sinusitis again noted. Electronically Signed   By: Vinnie Langton M.D.   On: 05/26/2021 08:56    Procedures Procedures    Medications Ordered in ED Medications - No data to display  ED Course/ Medical Decision Making/ A&P                           Medical Decision Making Amount and/or Complexity of Data Reviewed Labs: ordered. Radiology: ordered.   Patient presents after a fall.  Mechanical.  Hit head.  Mild neck pain.  Is on blood thinner however.  Is on for mechanical valve.  INR mildly subtherapeutic at 1.8, will need to follow-up with dosing provider for this.  Not ideal since it is mechanical valve, however with her recent head injury I think it can be adjusted as an outpatient.  Head CT done and independently interpreted which showed no bleeding.  Cervical spine fracture also reassuring.  Appears stable for discharge home. I reviewed discharge note from  admission around 6 months ago.       Final Clinical Impression(s) / ED Diagnoses Final diagnoses:  Fall, initial encounter  Injury of head, initial encounter    Rx / DC Orders ED Discharge Orders     None         Davonna Belling, MD 05/26/21 [phone]

## 2021-05-26 NOTE — Discharge Instructions (Signed)
Your INR was 1.8 today.  Call the doctor that is adjusting your Coumadin to let them know of this result. ?

## 2021-05-26 NOTE — ED Notes (Signed)
[place] scheduler called to cancel transportation at this time due to [employer] sending transportation. ?

## 2021-05-26 NOTE — ED Notes (Signed)
[employer] called to setup transportation at this time back to [facility]. ?

## 2021-05-26 NOTE — ED Triage Notes (Signed)
Pt brought in by [facility] from [facility] with c/o fall out of bed this morning. Pt reports she was rolling over in bed and rolled out of bed. She reports she hit her head on the nightstand. Pt has bruising to her forehead. Pt is on Coumadin. Pt is alert and oriented at time of triage. Denies LOC.  ?

## 2021-12-01 ENCOUNTER — Encounter (HOSPITAL_COMMUNITY): Payer: Self-pay | Admitting: Emergency Medicine

## 2021-12-01 ENCOUNTER — Emergency Department (HOSPITAL_COMMUNITY)
Admission: EM | Admit: 2021-12-01 | Discharge: 2021-12-01 | Disposition: A | Discharge status: Skilled Nursing Facility | Payer: Medicare Other | Attending: Emergency Medicine | Admitting: Emergency Medicine

## 2021-12-01 ENCOUNTER — Other Ambulatory Visit: Payer: Self-pay

## 2021-12-01 DIAGNOSIS — R791 Abnormal coagulation profile: Secondary | ICD-10-CM | POA: Diagnosis not present

## 2021-12-01 DIAGNOSIS — R799 Abnormal finding of blood chemistry, unspecified: Secondary | ICD-10-CM | POA: Diagnosis present

## 2021-12-01 LAB — CBC WITH DIFFERENTIAL/PLATELET
Abs Immature Granulocytes: 0.02 10*3/uL (ref 0.00–0.07)
Basophils Absolute: 0 10*3/uL (ref 0.0–0.1)
Basophils Relative: 1 %
Eosinophils Absolute: 1.5 10*3/uL — ABNORMAL HIGH (ref 0.0–0.5)
Eosinophils Relative: 23 %
HCT: 29.8 % — ABNORMAL LOW (ref 36.0–46.0)
Hemoglobin: 9.3 g/dL — ABNORMAL LOW (ref 12.0–15.0)
Immature Granulocytes: 0 %
Lymphocytes Relative: 14 %
Lymphs Abs: 0.9 10*3/uL (ref 0.7–4.0)
MCH: 31.1 pg (ref 26.0–34.0)
MCHC: 31.2 g/dL (ref 30.0–36.0)
MCV: 99.7 fL (ref 80.0–100.0)
Monocytes Absolute: 0.4 10*3/uL (ref 0.1–1.0)
Monocytes Relative: 6 %
Neutro Abs: 3.6 10*3/uL (ref 1.7–7.7)
Neutrophils Relative %: 56 %
Platelets: 198 10*3/uL (ref 150–400)
RBC: 2.99 MIL/uL — ABNORMAL LOW (ref 3.87–5.11)
RDW: 18.1 % — ABNORMAL HIGH (ref 11.5–15.5)
WBC: 6.4 10*3/uL (ref 4.0–10.5)
nRBC: 0 % (ref 0.0–0.2)

## 2021-12-01 LAB — COMPREHENSIVE METABOLIC PANEL
ALT: 11 U/L (ref 0–44)
AST: 25 U/L (ref 15–41)
Albumin: 3 g/dL — ABNORMAL LOW (ref 3.5–5.0)
Alkaline Phosphatase: 115 U/L (ref 38–126)
Anion gap: 9 (ref 5–15)
BUN: 26 mg/dL — ABNORMAL HIGH (ref 8–23)
CO2: 27 mmol/L (ref 22–32)
Calcium: 8.8 mg/dL — ABNORMAL LOW (ref 8.9–10.3)
Chloride: 101 mmol/L (ref 98–111)
Creatinine, Ser: 1.78 mg/dL — ABNORMAL HIGH (ref 0.44–1.00)
GFR, Estimated: 28 mL/min — ABNORMAL LOW (ref >60)
Glucose, Bld: 90 mg/dL (ref 70–99)
Potassium: 3.9 mmol/L (ref 3.5–5.1)
Sodium: 137 mmol/L (ref 135–145)
Total Bilirubin: 1 mg/dL (ref 0.3–1.2)
Total Protein: 7.7 g/dL (ref 6.5–8.1)

## 2021-12-01 LAB — PROTIME-INR
INR: 10.6 (ref 0.8–1.2)
Prothrombin Time: 82.5 seconds — ABNORMAL HIGH (ref 11.4–15.2)

## 2021-12-01 LAB — POC OCCULT BLOOD, ED: Fecal Occult Bld: NEGATIVE

## 2021-12-01 MED ORDER — PHYTONADIONE 5 MG PO TABS
2.5000 mg | ORAL_TABLET | Freq: Once | ORAL | Status: AC
  Administered 2021-12-01: 2.5 mg via ORAL
  Filled 2021-12-01: qty 1

## 2021-12-01 NOTE — ED Notes (Signed)
Called [facility] to take pt back to [facility].

## 2021-12-01 NOTE — ED Notes (Signed)
2nd attempt to call report unanswered.

## 2021-12-01 NOTE — ED Notes (Signed)
Ambulated to restroom  

## 2021-12-01 NOTE — ED Triage Notes (Signed)
[facility] after a abnormal lab resulted of an INR of 7.8. Pt reports she feel a little "wobbly" when walking.

## 2021-12-01 NOTE — Discharge Instructions (Addendum)
Mary [name] was given a dosage of vitamin k today  Hold coumadin until repeat INR is less than 3.5. Resume coumadin at 3mg   a day.  Hold 0.5 Monday, Wednesday and Friday dosage until reevaluated revaluated by primary care Stop  Please make sure pt has assistance with ambulation and activity in order to avoid falls

## 2021-12-01 NOTE — ED Notes (Signed)
Attempt to call report unanswered.

## 2021-12-02 NOTE — ED Provider Notes (Signed)
[facility] EMERGENCY DEPARTMENT Provider Note   CSN: [id number] Arrival date & time: 12/01/21  1227     History  Chief Complaint  Patient presents with   Abnormal Lab    Mary [name] is a 81 y.o. female.  Pt sent to ED for an elevated INR. Pt had an INR of 7.8 at the facility where she lives.  Pt denies any complaints.  She reports she feels normal.  Pt requesting food. She states her medications have not changed.  Pt denies any falls or injuries   The history is provided by the patient.  Abnormal Lab Time since result:  Today Patient referred by:  Nursing home Result type: coagulation studies        Home Medications Prior to Admission medications   Medication Sig Start Date End Date Taking? Authorizing Provider  acetaminophen (TYLENOL) 500 MG tablet Take 500 mg by mouth 2 (two) times daily as needed for mild pain or moderate pain.    Yes [provider]  ALPRAZolam Duanne Moron) 0.5 MG tablet Take 0.5 mg by mouth every 8 (eight) hours as needed for anxiety (agitation).   Yes [provider]  buPROPion (WELLBUTRIN XL) 150 MG 24 hr tablet Take 150 mg by mouth daily.   Yes [provider]  busPIRone (BUSPAR) 10 MG tablet Take 10 mg by mouth 2 (two) times daily.   Yes [provider]  docusate sodium (COLACE) 100 MG capsule Take 1 capsule (100 mg total) by mouth 2 (two) times daily. 08/19/20  Yes Rai, Ripudeep K, MD  HYDROcodone-acetaminophen (NORCO) 10-325 MG tablet Take 1 tablet by mouth every 6 (six) hours as needed for severe pain. Patient taking differently: Take 1 tablet by mouth in the morning and at bedtime. 12/21/20  Yes Barton Dubois, MD  Multiple Vitamins-Minerals (MULTIVITAMIN WITH MINERALS) tablet Take 1 tablet by mouth daily.   Yes [provider]  pantoprazole (PROTONIX) 40 MG tablet Take 1 tablet (40 mg total) by mouth daily before breakfast. 03/21/16  Yes Rehman, Mechele Dawley, MD  polyethylene glycol (MIRALAX / GLYCOLAX) 17 g packet  Take 17 g by mouth daily.   Yes [provider]  QUEtiapine (SEROQUEL) 25 MG tablet Take 25 mg by mouth at bedtime. 12/07/20  Yes [provider]  simvastatin (ZOCOR) 40 MG tablet Take 40 mg by mouth at bedtime. 12/07/20  Yes [provider]  sodium bicarbonate 650 MG tablet Take 650 mg by mouth 3 (three) times daily.   Yes [provider]  spironolactone (ALDACTONE) 100 MG tablet Take 0.5 tablets (50 mg total) by mouth daily. 12/21/20  Yes Barton Dubois, MD  torsemide (DEMADEX) 20 MG tablet Take 20 mg by mouth 2 (two) times daily.   Yes [provider]  vitamin B-12 (CYANOCOBALAMIN) 500 MCG tablet Take 500 mcg by mouth daily.   Yes [provider]  warfarin (COUMADIN) 1 MG tablet Take 0.5 tablets by mouth 3 (three) times a week. On Mon, Wed, Fri. In addition to pt's daily 3mg  dose of warfarin to equal 3.5mg    Yes [provider]  warfarin (COUMADIN) 3 MG tablet Take 1 tablet (3 mg total) by mouth daily. 08/19/20  Yes Rai, Ripudeep K, MD  Amino Acids-Protein Hydrolys (FEEDING SUPPLEMENT, PRO-STAT SUGAR FREE 64,) LIQD Take 30 mLs by mouth daily.    [provider]      Allergies    Tape    Review of Systems   Review of Systems  All  other systems reviewed and are negative.   Physical Exam Updated Vital Signs BP 128/68   Pulse 81   Temp 98.1 F (36.7 C) (Oral)   Resp 17   Ht 5\' 7"  (1.702 m)   Wt 63.5 kg   SpO2 97%   BMI 21.93 kg/m  Physical Exam Vitals and nursing note reviewed.  Constitutional:      Appearance: She is well-developed.  HENT:     Head: Normocephalic.     Mouth/Throat:     Mouth: Mucous membranes are moist.  Eyes:     Pupils: Pupils are equal, round, and reactive to light.  Cardiovascular:     Rate and Rhythm: Normal rate.  Pulmonary:     Effort: Pulmonary effort is normal.  Abdominal:     General: There is no distension.  Musculoskeletal:        General: Normal range of motion.      Cervical back: Normal range of motion.  Skin:    General: Skin is warm.  Neurological:     General: No focal deficit present.     Mental Status: She is alert and oriented to person, place, and time.     ED Results / Procedures / Treatments   Labs (all labs ordered are listed, but only abnormal results are displayed) Labs Reviewed  CBC WITH DIFFERENTIAL/PLATELET - Abnormal; Notable for the following components:      Result Value   RBC 2.99 (*)    Hemoglobin 9.3 (*)    HCT 29.8 (*)    RDW 18.1 (*)    Eosinophils Absolute 1.5 (*)    All other components within normal limits  COMPREHENSIVE METABOLIC PANEL - Abnormal; Notable for the following components:   BUN 26 (*)    Creatinine, Ser 1.78 (*)    Calcium 8.8 (*)    Albumin 3.0 (*)    GFR, Estimated 28 (*)    All other components within normal limits  PROTIME-INR - Abnormal; Notable for the following components:   Prothrombin Time 82.5 (*)    INR 10.6 (*)    All other components within normal limits  POC OCCULT BLOOD, ED    EKG None  Radiology No results found.  Procedures Procedures    Medications Ordered in ED Medications  phytonadione (VITAMIN K) tablet 2.5 mg (2.5 mg Oral Given 12/01/21 1725)    ED Course/ Medical Decision Making/ A&P                           Medical Decision Making Pt here for elevated INR.    Amount and/or Complexity of Data Reviewed External Data Reviewed: labs.    Details: INR is was 7.8  Labs: ordered. Decision-making details documented in ED Course.    Details: Labs ordered reviewed and interpreted  BUN 26  INR is elevated to 10.6.   Hemocult negative Discussion of management or test interpretation with external provider(s): I discussed with Hospitalist.  Pt will not require admission if not bleeding.  Hospital adived one dose of vitamin k po  Risk Prescription drug management. Risk Details: Pt advised Pt to hold couanid until INR normalizes  Creatine is  1.78         Final Clinical Impression(s) / ED Diagnoses Final diagnoses:  Elevated INR    Rx / DC Orders ED Discharge Orders     None     An After Visit Summary was printed and given to the  patient.     Fransico Meadow, [place] 12/04/21 1346    Milton Ferguson, MD 12/10/21 1150

## 2022-04-14 ENCOUNTER — Inpatient Hospital Stay (HOSPITAL_COMMUNITY)
Admission: EM | Admit: 2022-04-14 | Discharge: 2022-04-28 | DRG: 371 | Disposition: A | Discharge status: Skilled Nursing Facility | Payer: Medicare Other | Attending: Family Medicine | Admitting: Family Medicine

## 2022-04-14 ENCOUNTER — Inpatient Hospital Stay (HOSPITAL_COMMUNITY): Payer: Medicare Other

## 2022-04-14 ENCOUNTER — Encounter (HOSPITAL_COMMUNITY): Payer: Self-pay | Admitting: Emergency Medicine

## 2022-04-14 ENCOUNTER — Emergency Department (HOSPITAL_COMMUNITY): Payer: Medicare Other

## 2022-04-14 ENCOUNTER — Other Ambulatory Visit: Payer: Self-pay

## 2022-04-14 DIAGNOSIS — Z7189 Other specified counseling: Secondary | ICD-10-CM | POA: Diagnosis not present

## 2022-04-14 DIAGNOSIS — A419 Sepsis, unspecified organism: Secondary | ICD-10-CM | POA: Diagnosis present

## 2022-04-14 DIAGNOSIS — E43 Unspecified severe protein-calorie malnutrition: Secondary | ICD-10-CM | POA: Diagnosis present

## 2022-04-14 DIAGNOSIS — Z7901 Long term (current) use of anticoagulants: Secondary | ICD-10-CM

## 2022-04-14 DIAGNOSIS — R791 Abnormal coagulation profile: Secondary | ICD-10-CM

## 2022-04-14 DIAGNOSIS — N179 Acute kidney failure, unspecified: Secondary | ICD-10-CM | POA: Diagnosis present

## 2022-04-14 DIAGNOSIS — F0393 Unspecified dementia, unspecified severity, with mood disturbance: Secondary | ICD-10-CM | POA: Diagnosis present

## 2022-04-14 DIAGNOSIS — Z952 Presence of prosthetic heart valve: Secondary | ICD-10-CM | POA: Diagnosis not present

## 2022-04-14 DIAGNOSIS — J690 Pneumonitis due to inhalation of food and vomit: Secondary | ICD-10-CM | POA: Diagnosis present

## 2022-04-14 DIAGNOSIS — Z91048 Other nonmedicinal substance allergy status: Secondary | ICD-10-CM

## 2022-04-14 DIAGNOSIS — Z1152 Encounter for screening for COVID-19: Secondary | ICD-10-CM

## 2022-04-14 DIAGNOSIS — N183 Chronic kidney disease, stage 3 unspecified: Secondary | ICD-10-CM | POA: Diagnosis not present

## 2022-04-14 DIAGNOSIS — F32A Depression, unspecified: Secondary | ICD-10-CM | POA: Diagnosis present

## 2022-04-14 DIAGNOSIS — F0394 Unspecified dementia, unspecified severity, with anxiety: Secondary | ICD-10-CM | POA: Diagnosis present

## 2022-04-14 DIAGNOSIS — E1122 Type 2 diabetes mellitus with diabetic chronic kidney disease: Secondary | ICD-10-CM | POA: Diagnosis present

## 2022-04-14 DIAGNOSIS — Z6821 Body mass index (BMI) 21.0-21.9, adult: Secondary | ICD-10-CM

## 2022-04-14 DIAGNOSIS — R58 Hemorrhage, not elsewhere classified: Secondary | ICD-10-CM

## 2022-04-14 DIAGNOSIS — E876 Hypokalemia: Secondary | ICD-10-CM | POA: Diagnosis not present

## 2022-04-14 DIAGNOSIS — Z96642 Presence of left artificial hip joint: Secondary | ICD-10-CM | POA: Diagnosis present

## 2022-04-14 DIAGNOSIS — Z8701 Personal history of pneumonia (recurrent): Secondary | ICD-10-CM

## 2022-04-14 DIAGNOSIS — Z515 Encounter for palliative care: Secondary | ICD-10-CM

## 2022-04-14 DIAGNOSIS — E1140 Type 2 diabetes mellitus with diabetic neuropathy, unspecified: Secondary | ICD-10-CM | POA: Diagnosis present

## 2022-04-14 DIAGNOSIS — D6869 Other thrombophilia: Secondary | ICD-10-CM | POA: Insufficient documentation

## 2022-04-14 DIAGNOSIS — D62 Acute posthemorrhagic anemia: Secondary | ICD-10-CM | POA: Diagnosis present

## 2022-04-14 DIAGNOSIS — E782 Mixed hyperlipidemia: Secondary | ICD-10-CM | POA: Diagnosis present

## 2022-04-14 DIAGNOSIS — J189 Pneumonia, unspecified organism: Principal | ICD-10-CM

## 2022-04-14 DIAGNOSIS — R197 Diarrhea, unspecified: Secondary | ICD-10-CM | POA: Diagnosis not present

## 2022-04-14 DIAGNOSIS — I130 Hypertensive heart and chronic kidney disease with heart failure and stage 1 through stage 4 chronic kidney disease, or unspecified chronic kidney disease: Secondary | ICD-10-CM | POA: Diagnosis present

## 2022-04-14 DIAGNOSIS — Z66 Do not resuscitate: Secondary | ICD-10-CM | POA: Diagnosis not present

## 2022-04-14 DIAGNOSIS — N184 Chronic kidney disease, stage 4 (severe): Secondary | ICD-10-CM | POA: Diagnosis present

## 2022-04-14 DIAGNOSIS — R627 Adult failure to thrive: Secondary | ICD-10-CM | POA: Diagnosis present

## 2022-04-14 DIAGNOSIS — D6859 Other primary thrombophilia: Secondary | ICD-10-CM | POA: Diagnosis present

## 2022-04-14 DIAGNOSIS — Z981 Arthrodesis status: Secondary | ICD-10-CM

## 2022-04-14 DIAGNOSIS — K219 Gastro-esophageal reflux disease without esophagitis: Secondary | ICD-10-CM | POA: Diagnosis present

## 2022-04-14 DIAGNOSIS — I5032 Chronic diastolic (congestive) heart failure: Secondary | ICD-10-CM | POA: Diagnosis present

## 2022-04-14 DIAGNOSIS — D6959 Other secondary thrombocytopenia: Secondary | ICD-10-CM | POA: Diagnosis present

## 2022-04-14 DIAGNOSIS — R1310 Dysphagia, unspecified: Secondary | ICD-10-CM | POA: Diagnosis not present

## 2022-04-14 DIAGNOSIS — Z8673 Personal history of transient ischemic attack (TIA), and cerebral infarction without residual deficits: Secondary | ICD-10-CM

## 2022-04-14 DIAGNOSIS — K683 Retroperitoneal hematoma: Principal | ICD-10-CM | POA: Diagnosis present

## 2022-04-14 DIAGNOSIS — N189 Chronic kidney disease, unspecified: Secondary | ICD-10-CM | POA: Diagnosis present

## 2022-04-14 DIAGNOSIS — Z833 Family history of diabetes mellitus: Secondary | ICD-10-CM

## 2022-04-14 DIAGNOSIS — R6510 Systemic inflammatory response syndrome (SIRS) of non-infectious origin without acute organ dysfunction: Secondary | ICD-10-CM | POA: Diagnosis present

## 2022-04-14 DIAGNOSIS — D631 Anemia in chronic kidney disease: Secondary | ICD-10-CM | POA: Diagnosis present

## 2022-04-14 DIAGNOSIS — J383 Other diseases of vocal cords: Secondary | ICD-10-CM | POA: Diagnosis not present

## 2022-04-14 LAB — COMPREHENSIVE METABOLIC PANEL
ALT: 13 U/L (ref 0–44)
AST: 29 U/L (ref 15–41)
Albumin: 2.9 g/dL — ABNORMAL LOW (ref 3.5–5.0)
Alkaline Phosphatase: 89 U/L (ref 38–126)
Anion gap: 12 (ref 5–15)
BUN: 24 mg/dL — ABNORMAL HIGH (ref 8–23)
CO2: 24 mmol/L (ref 22–32)
Calcium: 8.8 mg/dL — ABNORMAL LOW (ref 8.9–10.3)
Chloride: 99 mmol/L (ref 98–111)
Creatinine, Ser: 1.77 mg/dL — ABNORMAL HIGH (ref 0.44–1.00)
GFR, Estimated: 28 mL/min — ABNORMAL LOW (ref >60)
Glucose, Bld: 125 mg/dL — ABNORMAL HIGH (ref 70–99)
Potassium: 4.4 mmol/L (ref 3.5–5.1)
Sodium: 135 mmol/L (ref 135–145)
Total Bilirubin: 1.7 mg/dL — ABNORMAL HIGH (ref 0.3–1.2)
Total Protein: 7.5 g/dL (ref 6.5–8.1)

## 2022-04-14 LAB — LACTIC ACID, PLASMA
Lactic Acid, Venous: 2 mmol/L (ref 0.5–1.9)
Lactic Acid, Venous: 2.2 mmol/L (ref 0.5–1.9)
Lactic Acid, Venous: 2.2 mmol/L (ref 0.5–1.9)

## 2022-04-14 LAB — CBC WITH DIFFERENTIAL/PLATELET
Abs Immature Granulocytes: 0.07 10*3/uL (ref 0.00–0.07)
Basophils Absolute: 0.1 10*3/uL (ref 0.0–0.1)
Basophils Relative: 0 %
Eosinophils Absolute: 0.3 10*3/uL (ref 0.0–0.5)
Eosinophils Relative: 3 %
HCT: 29.6 % — ABNORMAL LOW (ref 36.0–46.0)
Hemoglobin: 9.3 g/dL — ABNORMAL LOW (ref 12.0–15.0)
Immature Granulocytes: 1 %
Lymphocytes Relative: 8 %
Lymphs Abs: 0.9 10*3/uL (ref 0.7–4.0)
MCH: 31.7 pg (ref 26.0–34.0)
MCHC: 31.4 g/dL (ref 30.0–36.0)
MCV: 101 fL — ABNORMAL HIGH (ref 80.0–100.0)
Monocytes Absolute: 0.7 10*3/uL (ref 0.1–1.0)
Monocytes Relative: 6 %
Neutro Abs: 9.8 10*3/uL — ABNORMAL HIGH (ref 1.7–7.7)
Neutrophils Relative %: 82 %
Platelets: 302 10*3/uL (ref 150–400)
RBC: 2.93 MIL/uL — ABNORMAL LOW (ref 3.87–5.11)
RDW: 18.6 % — ABNORMAL HIGH (ref 11.5–15.5)
WBC: 11.8 10*3/uL — ABNORMAL HIGH (ref 4.0–10.5)
nRBC: 0 % (ref 0.0–0.2)

## 2022-04-14 LAB — URINALYSIS, COMPLETE (UACMP) WITH MICROSCOPIC
Bacteria, UA: NONE SEEN
Bilirubin Urine: NEGATIVE
Glucose, UA: NEGATIVE mg/dL
Hgb urine dipstick: NEGATIVE
Ketones, ur: NEGATIVE mg/dL
Leukocytes,Ua: NEGATIVE
Nitrite: NEGATIVE
Protein, ur: NEGATIVE mg/dL
Specific Gravity, Urine: >1.046 — ABNORMAL HIGH (ref 1.005–1.030)
pH: 5 (ref 5.0–8.0)

## 2022-04-14 LAB — RESP PANEL BY RT-PCR (RSV, FLU A&B, COVID)  RVPGX2
Influenza A by PCR: NEGATIVE
Influenza B by PCR: NEGATIVE
Resp Syncytial Virus by PCR: NEGATIVE
SARS Coronavirus 2 by RT PCR: NEGATIVE

## 2022-04-14 LAB — PROCALCITONIN: Procalcitonin: <0.1 ng/mL

## 2022-04-14 LAB — HEMOGLOBIN AND HEMATOCRIT, BLOOD
HCT: 19.9 % — ABNORMAL LOW (ref 36.0–46.0)
Hemoglobin: 6.3 g/dL — CL (ref 12.0–15.0)

## 2022-04-14 LAB — BRAIN NATRIURETIC PEPTIDE: B Natriuretic Peptide: 219 pg/mL — ABNORMAL HIGH (ref 0.0–100.0)

## 2022-04-14 LAB — MRSA NEXT GEN BY PCR, NASAL: MRSA by PCR Next Gen: NOT DETECTED

## 2022-04-14 LAB — PROTIME-INR
INR: >10 (ref 0.8–1.2)
Prothrombin Time: >90 seconds — ABNORMAL HIGH (ref 11.4–15.2)

## 2022-04-14 MED ORDER — PHYTONADIONE 5 MG PO TABS
5.0000 mg | ORAL_TABLET | Freq: Once | ORAL | Status: AC
  Administered 2022-04-14: 5 mg via ORAL
  Filled 2022-04-14: qty 1

## 2022-04-14 MED ORDER — SODIUM CHLORIDE 0.9 % IV BOLUS (SEPSIS)
1000.0000 mL | Freq: Once | INTRAVENOUS | Status: AC
  Administered 2022-04-14: 1000 mL via INTRAVENOUS

## 2022-04-14 MED ORDER — ACETAMINOPHEN 325 MG PO TABS
650.0000 mg | ORAL_TABLET | Freq: Four times a day (QID) | ORAL | Status: DC | PRN
  Administered 2022-04-14 – 2022-04-27 (×4): 650 mg via ORAL
  Filled 2022-04-14 (×4): qty 2

## 2022-04-14 MED ORDER — PIPERACILLIN-TAZOBACTAM 3.375 G IVPB
3.3750 g | Freq: Three times a day (TID) | INTRAVENOUS | Status: DC
  Administered 2022-04-14 – 2022-04-16 (×6): 3.375 g via INTRAVENOUS
  Filled 2022-04-14 (×6): qty 50

## 2022-04-14 MED ORDER — SODIUM CHLORIDE 0.9% IV SOLUTION: Freq: Once | INTRAVENOUS | Status: AC

## 2022-04-14 MED ORDER — IOHEXOL 350 MG/ML SOLN
80.0000 mL | Freq: Once | INTRAVENOUS | Status: AC | PRN
  Administered 2022-04-14: 75 mL via INTRAVENOUS

## 2022-04-14 MED ORDER — ONDANSETRON HCL 4 MG PO TABS: 4.0000 mg | ORAL_TABLET | Freq: Four times a day (QID) | ORAL | Status: DC | PRN

## 2022-04-14 MED ORDER — PANTOPRAZOLE SODIUM 40 MG PO TBEC
40.0000 mg | DELAYED_RELEASE_TABLET | Freq: Every day | ORAL | Status: DC
  Administered 2022-04-15 – 2022-04-17 (×3): 40 mg via ORAL
  Filled 2022-04-14 (×3): qty 1

## 2022-04-14 MED ORDER — CHLORHEXIDINE GLUCONATE CLOTH 2 % EX PADS
6.0000 | MEDICATED_PAD | Freq: Every day | CUTANEOUS | Status: DC
  Administered 2022-04-15 – 2022-04-28 (×14): 6 via TOPICAL

## 2022-04-14 MED ORDER — VITAMIN K1 10 MG/ML IJ SOLN: 5.0000 mg | Freq: Once | INTRAVENOUS | Status: DC

## 2022-04-14 MED ORDER — SODIUM BICARBONATE 650 MG PO TABS
650.0000 mg | ORAL_TABLET | Freq: Three times a day (TID) | ORAL | Status: DC
  Administered 2022-04-14 – 2022-04-28 (×41): 650 mg via ORAL
  Filled 2022-04-14 (×41): qty 1

## 2022-04-14 MED ORDER — SODIUM CHLORIDE 0.9 % IV SOLN
2.0000 g | INTRAVENOUS | Status: DC
  Administered 2022-04-14: 2 g via INTRAVENOUS
  Filled 2022-04-14: qty 20

## 2022-04-14 MED ORDER — ALPRAZOLAM 0.5 MG PO TABS
0.5000 mg | ORAL_TABLET | Freq: Three times a day (TID) | ORAL | Status: DC | PRN
  Administered 2022-04-15 – 2022-04-27 (×8): 0.5 mg via ORAL
  Filled 2022-04-14 (×9): qty 1

## 2022-04-14 MED ORDER — VITAMIN K1 10 MG/ML IJ SOLN
5.0000 mg | Freq: Once | INTRAVENOUS | Status: DC
  Filled 2022-04-14: qty 0.5

## 2022-04-14 MED ORDER — LACTATED RINGERS IV SOLN: INTRAVENOUS | Status: DC

## 2022-04-14 MED ORDER — ACETAMINOPHEN 650 MG RE SUPP
650.0000 mg | Freq: Four times a day (QID) | RECTAL | Status: DC | PRN
  Administered 2022-04-14: 650 mg via RECTAL
  Filled 2022-04-14: qty 1

## 2022-04-14 MED ORDER — BUPROPION HCL ER (XL) 150 MG PO TB24
150.0000 mg | ORAL_TABLET | Freq: Every morning | ORAL | Status: DC
  Administered 2022-04-15 – 2022-04-28 (×14): 150 mg via ORAL
  Filled 2022-04-14 (×15): qty 1

## 2022-04-14 MED ORDER — SODIUM CHLORIDE 0.9 % IV SOLN
500.0000 mg | INTRAVENOUS | Status: DC
  Administered 2022-04-14 – 2022-04-16 (×3): 500 mg via INTRAVENOUS
  Filled 2022-04-14 (×3): qty 5

## 2022-04-14 MED ORDER — ONDANSETRON HCL 4 MG/2ML IJ SOLN
4.0000 mg | Freq: Four times a day (QID) | INTRAMUSCULAR | Status: DC | PRN
  Administered 2022-04-14 – 2022-04-19 (×4): 4 mg via INTRAVENOUS
  Filled 2022-04-14 (×4): qty 2

## 2022-04-14 MED ORDER — QUETIAPINE FUMARATE 25 MG PO TABS
25.0000 mg | ORAL_TABLET | Freq: Every day | ORAL | Status: DC
  Administered 2022-04-14 – 2022-04-16 (×3): 25 mg via ORAL
  Filled 2022-04-14 (×3): qty 1

## 2022-04-14 MED ORDER — SODIUM CHLORIDE 0.9 % IV SOLN: INTRAVENOUS | Status: DC

## 2022-04-14 MED ORDER — BUSPIRONE HCL 5 MG PO TABS
10.0000 mg | ORAL_TABLET | Freq: Two times a day (BID) | ORAL | Status: DC
  Administered 2022-04-14 – 2022-04-28 (×28): 10 mg via ORAL
  Filled 2022-04-14 (×28): qty 2

## 2022-04-14 MED ORDER — SODIUM CHLORIDE 0.9 % IV BOLUS
1000.0000 mL | Freq: Once | INTRAVENOUS | Status: AC
Start: 2022-04-14 — End: 2022-04-14
  Administered 2022-04-14: 1000 mL via INTRAVENOUS

## 2022-04-14 NOTE — H&P (Signed)
History and Physical    Patient: Mary [name] OMV:[id number] DOB: 11-15-1940 DOA: 04/14/2022 DOS: the patient was seen and examined on 04/14/2022 PCP: Pcp, No  Patient coming from: ALF/ILF  Chief Complaint:  Chief Complaint  Patient presents with   Nausea   HPI: Mary [name] is a 82 year old female with a history of dementia, hypertension, hyperlipidemia, CKD stage III, anxiety, mechanical aortic valve, stroke, diastolic CHF, and anxiety presenting from [facility] with generalized weakness for 3 to 4 days.  She has had associated decreased oral intake and nausea.  The patient is a difficult historian.  The patient's spouse is at the bedside.  He is also a difficult historian.  Nevertheless, it appears that the patient was treated for pneumonia.  Review of the [facility] from [place] shows the patient was started on levofloxacin on 04/08/2022 for 5-day course.  Even with the antibiotics, the patient continued to have progressive generalized weakness.  She denies any headache, neck pain, chest pain, hemoptysis, vomiting, diarrhea.  She does complain of abdominal pain.  She states that the abdominal pain has been present for 3 to 4 days.  There is no hematochezia or melena.  She has some dyspnea with exertion. In the ED, the patient was afebrile hemodynamically stable with oxygen saturation 99% on room air.  WBC 10.8, hemoglobin 9.3, platelets 302,000.  Sodium 135, potassium 4.4, bicarbonate 24, serum creatinine 1.77.  The patient was started on IV ceftriaxone and azithromycin and IV fluids.  She was admitted for further evaluation and treatment.   Review of Systems: As mentioned in the history of present illness. All other systems reviewed and are negative. Past Medical History:  Diagnosis Date   Anemia in CKD (chronic kidney disease)    Anxiety    Dementia with behavioral disturbance ([place])    Diabetes mellitus with neuropathy (HCC)    Diabetic neuropathy (HCC)    DJD (degenerative joint disease)     GERD (gastroesophageal reflux disease)    H/O aortic valve replacement    Hyperlipidemia    Hypertension    Neuromuscular disorder (HCC)    neuropathy in feet   Obesity    Sleep apnea    Stroke ([place])    " light stroke"   Vitamin D deficiency    Wears glasses    Past Surgical History:  Procedure Laterality Date   ANKLE SURGERY     Left tendon repair   ANTERIOR APPROACH HEMI HIP ARTHROPLASTY Left 08/13/2020   Procedure: ANTERIOR APPROACH HEMI HIP ARTHROPLASTY;  Surgeon: Leandrew Koyanagi, MD;  Location: [facility];  Service: Orthopedics;  Laterality: Left;   AORTIC VALVE REPLACEMENT  03/1994   CARDIAC CATHETERIZATION     1996 [facility]   CERVICAL SPINE SURGERY     COLONOSCOPY     HEMORRHOID SURGERY     IR PARACENTESIS  10/31/2019   KNEE ARTHROSCOPY Right 04/13/2015   Procedure: RIGHT KNEE ARTHROSCOPY WITH DEBRIDEMENT AND PARTIAL MEDIAL MENISCECTOMY;  Surgeon: Mcarthur Rossetti, MD;  Location: [facility];  Service: Orthopedics;  Laterality: Right;   KNEE ARTHROSCOPY W/ MENISCECTOMY Right 04/13/2015   LUMBAR FUSION     RIGHT HEART CATH N/A 10/28/2019   Procedure: RIGHT HEART CATH;  Surgeon: Leonie Man, MD;  Location: [facility];  Service: Cardiovascular;  Laterality: N/A;   Social History:  reports that she has never smoked. She has never used smokeless tobacco. She reports that she does not drink alcohol and does not use drugs.  Allergies  Allergen Reactions   Tape Itching    Redness, Please use "paper" tape only    Family History  Problem Relation Age of Onset   Pneumonia Sister    Dementia Mother    Diabetes Sister     Prior to Admission medications   Medication Sig Start Date End Date Taking? Authorizing Provider  acetaminophen (TYLENOL) 500 MG tablet Take 500 mg by mouth 2 (two) times daily as needed for mild pain or moderate pain.     [provider]  ALPRAZolam Duanne Moron) 0.5 MG tablet Take 0.5 mg by mouth every 8 (eight) hours as needed for anxiety  (agitation).    [provider]  Amino Acids-Protein Hydrolys (FEEDING SUPPLEMENT, PRO-STAT SUGAR FREE 64,) LIQD Take 30 mLs by mouth daily.    [provider]  amoxicillin (AMOXIL) 500 MG capsule Take 2,000 mg by mouth See admin instructions. Take 2000mg  by mouth once daily for 1 day one hour pre-op. 04/04/22   [provider]  buPROPion (WELLBUTRIN XL) 150 MG 24 hr tablet Take 150 mg by mouth daily.    [provider]  busPIRone (BUSPAR) 10 MG tablet Take 10 mg by mouth 2 (two) times daily.    [provider]  docusate sodium (COLACE) 100 MG capsule Take 1 capsule (100 mg total) by mouth 2 (two) times daily. 08/19/20   Rai, Vernelle Emerald, MD  HYDROcodone-acetaminophen (NORCO) 10-325 MG tablet Take 1 tablet by mouth every 6 (six) hours as needed for severe pain. Patient taking differently: Take 1 tablet by mouth in the morning and at bedtime. 12/21/20   Barton Dubois, MD  Multiple Vitamins-Minerals (MULTIVITAMIN WITH MINERALS) tablet Take 1 tablet by mouth daily.    [provider]  pantoprazole (PROTONIX) 40 MG tablet Take 1 tablet (40 mg total) by mouth daily before breakfast. 03/21/16   Rehman, Mechele Dawley, MD  polyethylene glycol (MIRALAX / GLYCOLAX) 17 g packet Take 17 g by mouth daily.    [provider]  QUEtiapine (SEROQUEL) 25 MG tablet Take 25 mg by mouth at bedtime. 12/07/20   [provider]  simvastatin (ZOCOR) 40 MG tablet Take 40 mg by mouth at bedtime. 12/07/20   [provider]  sodium bicarbonate 650 MG tablet Take 650 mg by mouth 3 (three) times daily.    [provider]  spironolactone (ALDACTONE) 100 MG tablet Take 0.5 tablets (50 mg total) by mouth daily. 12/21/20   Barton Dubois, MD  torsemide (DEMADEX) 20 MG tablet Take 20 mg by mouth 2 (two) times daily.    [provider]  vitamin B-12 (CYANOCOBALAMIN) 500 MCG tablet Take 500 mcg by mouth daily.    [provider]  warfarin  (COUMADIN) 1 MG tablet Take 0.5 tablets by mouth 3 (three) times a week. On Mon, Wed, Fri. In addition to pt's daily 3mg  dose of warfarin to equal 3.5mg     [provider]  warfarin (COUMADIN) 3 MG tablet Take 1 tablet (3 mg total) by mouth daily. 08/19/20   Mendel Corning, MD    Physical Exam: Vitals:   04/14/22 1200 04/14/22 1215 04/14/22 1230 04/14/22 1245  BP: (!) 122/51 (!) 113/53 104/63 (!) 114/56  Pulse: 96 94 92 (!) 101  Resp: 18 (!) 24 (!) 21 18  Temp:      TempSrc:      SpO2: 95% 99% 99% 98%  Weight:      Height:       GENERAL:  A&O x 2, NAD, well developed, cooperative, follows commands HEENT: [place]/AT, No thrush, No icterus, No oral ulcers Neck:  No neck mass, No meningismus, soft, supple CV: RRR, no S3, no S4, no rub, no JVD Lungs:  bibasilar rales. No wheeze Abd: soft/diffuse tenderness +BS, nondistended;  ecchymosis upper abd wall Ext: No edema, no lymphangitis, no cyanosis, no rashes Neuro:  CN II-XII intact, strength 4/5 in RUE, RLE, strength 4/5 LUE, LLE; sensation intact bilateral; no dysmetria; babinski equivocal  Data Reviewed: Data reviewed above in history  Assessment and Plan: SIRS -concerned about intra-abdominal source of infection -CT abd -presented with leukocytosis, tachycardia, elevated lactate -follow blood culture -Lactic acid peaked 2.2 -Continue IV fluids -Start IV Zosyn pending culture data -Obtain UA  -Chest x-ray without focal consolidation  Abdominal pain -CT abdomen and pelvis  Supratherapeutic INR -Likely secondary to the patient's recent use of levofloxacin and decreased oral intake in the setting of warfarin use -Vitamin K 5 mg x 1 -Daily INR -Hemoglobin stable without any signs of active bleed  Status post mechanical aortic valve replacement -Continue anticoagulation with INR 2.5-3.5 -Holding warfarin temporarily secondary to supratherapeutic INR  Essential hypertension -Holding antihypertensives secondary to soft  blood pressures  Dementia without behavioral disturbance -At risk for hospital delirium -Continue Seroquel at bedtime and as needed alprazolam  Mixed hyperlipidemia -Restart simvastatin when able to tolerate p.o.  Anxiety/depression -Restart buspirone  CKD stage IV -Baseline creatinine 1.7 -Monitor daily BMP     Advance Care Planning: FULL CODE  Consults: none  Family Communication: spouse updated 1/26  Severity of Illness: The appropriate patient status for this patient is INPATIENT. Inpatient status is judged to be reasonable and necessary in order to provide the required intensity of service to ensure the patient's safety. The patient's presenting symptoms, physical exam findings, and initial radiographic and laboratory data in the context of their chronic comorbidities is felt to place them at high risk for further clinical deterioration. Furthermore, it is not anticipated that the patient will be medically stable for discharge from the hospital within 2 midnights of admission.   * I certify that at the point of admission it is my clinical judgment that the patient will require inpatient hospital care spanning beyond 2 midnights from the point of admission due to high intensity of service, high risk for further deterioration and high frequency of surveillance required.*  Author: Orson Eva, MD 04/14/2022 1:10 PM  For on call review [email or web address].

## 2022-04-14 NOTE — Progress Notes (Signed)
   04/14/22 1705  Assess: MEWS Score  Temp 97.7 F (36.5 C)  BP (!) 103/52  Pulse Rate (!) 109  Resp (!) 24  SpO2 100 %  O2 Device Room Air  Assess: MEWS Score  MEWS Temp 0  MEWS Systolic 0  MEWS Pulse 1  MEWS RR 1  MEWS LOC 0  MEWS Score 2  MEWS Score Color Yellow  Assess: if the MEWS score is Yellow or Red  Were vital signs taken at a resting state? Yes  Focused Assessment Change from prior assessment (see assessment flowsheet)  Does the patient meet 2 or more of the SIRS criteria? Yes  Take Vital Signs  Increase Vital Sign Frequency  Yellow: Q 2hr X 2 then Q 4hr X 2, if remains yellow, continue Q 4hrs  Escalate  MEWS: Escalate Yellow: discuss with charge nurse/RN and consider discussing with provider and RRT  Notify: Charge Nurse/RN  Name of Charge Nurse/RN Notified Izora Gala RN  Date Charge Nurse/RN Notified 04/14/22  Time Charge Nurse/RN Notified 1725  Provider Notification  Provider Name/Title MD Tat  Date Provider Notified 04/14/22  Time Provider Notified 1725  Method of Notification Rounds  Notification Reason Other (Comment) (Yellow MEWs)  Provider response See new orders  Date of Provider Response 04/14/22  Assess: SIRS CRITERIA  SIRS Temperature  0  SIRS Pulse 1  SIRS Respirations  1  SIRS WBC 0  SIRS Score Sum  2

## 2022-04-14 NOTE — Progress Notes (Signed)
   04/14/22 1631  Assess: MEWS Score  Temp 98.8 F (37.1 C)  BP (!) 125/57  Pulse Rate (!) 112 (MD aware of vitals.)  Resp (!) 26  SpO2 98 %  O2 Device Room Air  Assess: MEWS Score  MEWS Temp 0  MEWS Systolic 0  MEWS Pulse 2  MEWS RR 2  MEWS LOC 0  MEWS Score 4  MEWS Score Color Red  Assess: if the MEWS score is Yellow or Red  Were vital signs taken at a resting state? Yes  Focused Assessment Change from prior assessment (see assessment flowsheet)  Does the patient meet 2 or more of the SIRS criteria? Yes  Treat  Pain Scale 0-10  Pain Score Asleep  Notify: Charge Nurse/RN  Name of Charge Nurse/RN Notified Marine scientist  Date Charge Nurse/RN Notified 04/14/22  Time Charge Nurse/RN Notified 1633  Provider Notification  Provider Name/Title MD Tat  Date Provider Notified 04/14/22  Time Provider Notified 1633  Method of Notification Page (secure chat)  Notification Reason Other (Comment) (Red MEWs)  Assess: SIRS CRITERIA  SIRS Temperature  0  SIRS Pulse 1  SIRS Respirations  1  SIRS WBC 0  SIRS Score Sum  2

## 2022-04-14 NOTE — Progress Notes (Signed)
   04/14/22 1444  Assess: MEWS Score  Temp 98.8 F (37.1 C)  BP (!) 106/51  MAP (mmHg) 67  Pulse Rate 100  ECG Heart Rate (!) 105  Resp (!) 26  SpO2 100 %  O2 Device Room Air  Assess: MEWS Score  MEWS Temp 0  MEWS Systolic 0  MEWS Pulse 1  MEWS RR 2  MEWS LOC 0  MEWS Score 3  MEWS Score Color Yellow  Assess: if the MEWS score is Yellow or Red  Were vital signs taken at a resting state? Yes  Focused Assessment No change from prior assessment  Does the patient meet 2 or more of the SIRS criteria? Yes  Treat  Pain Scale 0-10  Pain Score 10  Pain Type Acute pain  Pain Location Other (Comment) (left side)  Notify: Charge Nurse/RN  Name of Charge Nurse/RN Notified Marine scientist  Date Charge Nurse/RN Notified 04/14/22  Time Charge Nurse/RN Notified 1452  Provider Notification  Provider Name/Title MD Tat  Date Provider Notified 04/14/22  Time Provider Notified 1452  Method of Notification Page (Secure chat)  Notification Reason Other (Comment) (yellow MEWs)  Provider response No new orders  Date of Provider Response 04/14/22  Time of Provider Response 1453  Assess: SIRS CRITERIA  SIRS Temperature  0  SIRS Pulse 1  SIRS Respirations  1  SIRS WBC 0  SIRS Score Sum  2

## 2022-04-14 NOTE — Progress Notes (Signed)
Patient tolerated first unit of plasma with no complaints. Vital signs: T-98.2, BP-123/44, P-107, R-24, O2-100% Room air. Contacted lab regarding patients second unit of plasma, unit not ready, reported to ICU nurse. Patient transferred to ICU per MD order.

## 2022-04-14 NOTE — Progress Notes (Addendum)
I discussed the patient's CT abdomen and pelvis with radiology--Dr. Lyndel Safe -- Large areas of presumed hematoma involving the rectus abdominis muscles bilaterally, left-greater-than-right. There is also large extraperitoneal hematoma along the margin of the iliopsoas muscle on the left -- I've ordered FFP x 2 units, vitamin K 10mg  total to be given --I discussed with surgery, Dr. Huntley Estelle to do surgical, may consider CTA abd/pelvis if Hgb drops --I 've ordered CTA abd/pelvis --I updated patient spouse regarding pt clinical condition and very guarded prognosis --discussed goals of care with spouse and patient>>confirms DNR -- 2 units PRBC ordered  DTat   1710 ADDENDUM -updated spouse and pt about CTA--no active extravasation -discussed overall very guarded prognosis -move to SDU due to red MEWS -updated pt's grand daughter

## 2022-04-14 NOTE — ED Notes (Signed)
RN certified in ultrasound IV attempted to obtain IV access, unable to obtain IV access, MD made aware

## 2022-04-14 NOTE — ED Notes (Signed)
MD able to obtain IV access, both sets of blood cultures drawn before any antibiotic administration

## 2022-04-14 NOTE — Progress Notes (Signed)
Lab called critical lab hemoglobin 6.3, HCT 19.9. MD Tat made aware.

## 2022-04-14 NOTE — Progress Notes (Signed)
Notified MD about Patient complaint of pain in abdomen and upper/lower back 6/10. Unable to tolerate PO meds at this times due to nausea and emesis after taking scheduled PO meds crushed in applesauce. Patient very restless and uncomfortable with repositioning. HR 115-124.   Tylenol suppository administered as BP is too low to give narcotic at this time. Will continue to monitor BP closely.

## 2022-04-14 NOTE — Sepsis Progress Note (Signed)
eLink is following this Code Sepsis.

## 2022-04-14 NOTE — Hospital Course (Signed)
82 year old female with a history of dementia, hypertension, hyperlipidemia, CKD stage III, anxiety, mechanical aortic valve, stroke, diastolic CHF, and anxiety presenting from [facility] with generalized weakness for 3 to 4 days.  She has had associated decreased oral intake and nausea.  The patient is a difficult historian.  The patient's spouse is at the bedside.  He is also a difficult historian.  Nevertheless, it appears that the patient was treated for pneumonia.  Review of the [facility] from [place] shows the patient was started on levofloxacin on 04/08/2022 for 5-day course.  Even with the antibiotics, the patient continued to have progressive generalized weakness.  She denies any headache, neck pain, chest pain, hemoptysis, vomiting, diarrhea.  She does complain of abdominal pain.  She states that the abdominal pain has been present for 3 to 4 days.  There is no hematochezia or melena.  She has some dyspnea with exertion. In the ED, the patient was afebrile hemodynamically stable with oxygen saturation 99% on room air.  WBC 10.8, hemoglobin 9.3, platelets 302,000.  Sodium 135, potassium 4.4, bicarbonate 24, serum creatinine 1.77.  The patient was started on IV ceftriaxone and azithromycin and IV fluids.  She was admitted for further evaluation and treatment.

## 2022-04-14 NOTE — ED Provider Notes (Signed)
[facility] Provider Note   CSN: [id number] Arrival date & time: 04/14/22  4098     History  Chief Complaint  Patient presents with   Nausea    Mary [name] is a 82 y.o. female.  Pt complains of fever and chills.  Pt reports she has had a cough and congestion.  Pt complains of abdominal discomfort.  Pt reports braising form taking coumadin.  Pt recently has had pneumonia.  Pt is on oral antibiotics. Facility reports pt has had an increasing INR. Pt has abdominal bruising   The history is provided by the patient. No language interpreter was used.       Home Medications Prior to Admission medications   Medication Sig Start Date End Date Taking? Authorizing Provider  acetaminophen (TYLENOL) 500 MG tablet Take 500 mg by mouth 2 (two) times daily as needed for mild pain or moderate pain.     [provider]  ALPRAZolam Duanne Moron) 0.5 MG tablet Take 0.5 mg by mouth every 8 (eight) hours as needed for anxiety (agitation).    [provider]  Amino Acids-Protein Hydrolys (FEEDING SUPPLEMENT, PRO-STAT SUGAR FREE 64,) LIQD Take 30 mLs by mouth daily.    [provider]  buPROPion (WELLBUTRIN XL) 150 MG 24 hr tablet Take 150 mg by mouth daily.    [provider]  busPIRone (BUSPAR) 10 MG tablet Take 10 mg by mouth 2 (two) times daily.    [provider]  docusate sodium (COLACE) 100 MG capsule Take 1 capsule (100 mg total) by mouth 2 (two) times daily. 08/19/20   Rai, Vernelle Emerald, MD  HYDROcodone-acetaminophen (NORCO) 10-325 MG tablet Take 1 tablet by mouth every 6 (six) hours as needed for severe pain. Patient taking differently: Take 1 tablet by mouth in the morning and at bedtime. 12/21/20   Barton Dubois, MD  Multiple Vitamins-Minerals (MULTIVITAMIN WITH MINERALS) tablet Take 1 tablet by mouth daily.    [provider]  pantoprazole (PROTONIX) 40 MG tablet Take 1 tablet (40 mg total) by mouth  daily before breakfast. 03/21/16   Rehman, Mechele Dawley, MD  polyethylene glycol (MIRALAX / GLYCOLAX) 17 g packet Take 17 g by mouth daily.    [provider]  QUEtiapine (SEROQUEL) 25 MG tablet Take 25 mg by mouth at bedtime. 12/07/20   [provider]  simvastatin (ZOCOR) 40 MG tablet Take 40 mg by mouth at bedtime. 12/07/20   [provider]  sodium bicarbonate 650 MG tablet Take 650 mg by mouth 3 (three) times daily.    [provider]  spironolactone (ALDACTONE) 100 MG tablet Take 0.5 tablets (50 mg total) by mouth daily. 12/21/20   Barton Dubois, MD  torsemide (DEMADEX) 20 MG tablet Take 20 mg by mouth 2 (two) times daily.    [provider]  vitamin B-12 (CYANOCOBALAMIN) 500 MCG tablet Take 500 mcg by mouth daily.    [provider]  warfarin (COUMADIN) 1 MG tablet Take 0.5 tablets by mouth 3 (three) times a week. On Mon, Wed, Fri. In addition to pt's daily 3mg  dose of warfarin to equal 3.5mg     [provider]  warfarin (COUMADIN) 3 MG tablet Take 1 tablet (3 mg total) by mouth daily. 08/19/20   Mendel Corning, MD      Allergies    Tape    Review of Systems   Review of Systems  All other systems reviewed and are negative.  Physical Exam Updated Vital Signs BP 120/64   Pulse 96   Temp (!) 97.5 F (36.4 C) (Oral)   Resp 18   Ht 5\' 7"  (1.702 m)   Wt 63.7 kg   SpO2 95%   BMI 21.99 kg/m  Physical Exam Vitals and nursing note reviewed.  Constitutional:      Appearance: She is well-developed.  HENT:     Head: Normocephalic.     Mouth/Throat:     Mouth: Mucous membranes are moist.  Eyes:     Pupils: Pupils are equal, round, and reactive to light.  Cardiovascular:     Rate and Rhythm: Normal rate.  Pulmonary:     Effort: Pulmonary effort is normal.  Abdominal:     General: There is no distension.  Musculoskeletal:        General: Normal range of motion.     Cervical back: Normal range of motion.  Skin:     General: Skin is warm.  Neurological:     General: No focal deficit present.     Mental Status: She is alert and oriented to person, place, and time.  Psychiatric:        Mood and Affect: Mood normal.     ED Results / Procedures / Treatments   Labs (all labs ordered are listed, but only abnormal results are displayed) Labs Reviewed  CBC WITH DIFFERENTIAL/PLATELET - Abnormal; Notable for the following components:      Result Value   WBC 11.8 (*)    RBC 2.93 (*)    Hemoglobin 9.3 (*)    HCT 29.6 (*)    MCV 101.0 (*)    RDW 18.6 (*)    Neutro Abs 9.8 (*)    All other components within normal limits  COMPREHENSIVE METABOLIC PANEL - Abnormal; Notable for the following components:   Glucose, Bld 125 (*)    BUN 24 (*)    Creatinine, Ser 1.77 (*)    Calcium 8.8 (*)    Albumin 2.9 (*)    Total Bilirubin 1.7 (*)    GFR, Estimated 28 (*)    All other components within normal limits  LACTIC ACID, PLASMA - Abnormal; Notable for the following components:   Lactic Acid, Venous 2.0 (*)    All other components within normal limits  LACTIC ACID, PLASMA - Abnormal; Notable for the following components:   Lactic Acid, Venous 2.2 (*)    All other components within normal limits  CULTURE, BLOOD (ROUTINE X 2)  CULTURE, BLOOD (ROUTINE X 2)  URINALYSIS, ROUTINE W REFLEX MICROSCOPIC  PROTIME-INR    EKG EKG Interpretation  Date/Time:  Friday April 14 2022 09:30:16 EST Ventricular Rate:  112 PR Interval:    QRS Duration: 136 QT Interval:  394 QTC Calculation: 538 R Axis:   -46 Text Interpretation: Atrial fibrillation Left bundle branch block since last tracing no significant change Confirmed by [name] [phone]) on 04/14/2022 10:45:16 AM  Radiology DG Chest 2 View  Result Date: 04/14/2022 CLINICAL DATA:  Fall EXAM: CHEST - 2 VIEW COMPARISON:  Chest x-ray dated September 23, 2021 FINDINGS: Cardiac and mediastinal contours are unchanged post median sternotomy. Left-greater-than-right  pleural effusions. No evidence of pneumothorax. Chronic appearing anterior second right rib fracture. IMPRESSION: Left-greater-than-right pleural effusions bibasilar atelectasis, similar to prior. Electronically Signed   By: Yetta Glassman M.D.   On: 04/14/2022 10:04    Procedures Procedures    Medications Ordered in ED Medications  lactated ringers infusion (0 mLs Intravenous  Hold 04/14/22 1124)  cefTRIAXone (ROCEPHIN) 2 g in sodium chloride 0.9 % 100 mL IVPB (0 g Intravenous Stopped 04/14/22 1140)  azithromycin (ZITHROMAX) 500 mg in sodium chloride 0.9 % 250 mL IVPB (500 mg Intravenous New Bag/Given 04/14/22 1140)  sodium chloride 0.9 % bolus 1,000 mL (0 mLs Intravenous Stopped 04/14/22 1134)  sodium chloride 0.9 % bolus 1,000 mL (1,000 mLs Intravenous Bolus 04/14/22 1100)    And  sodium chloride 0.9 % bolus 1,000 mL (1,000 mLs Intravenous Bolus 04/14/22 1134)    ED Course/ Medical Decision Making/ A&P                             Medical Decision Making Pt complains of a fever and cough.  Pt reports increased bruising from coumadin .  Pt complains of increased weakness   Amount and/or Complexity of Data Reviewed Independent Historian: spouse    Details: Pt's husband is here and helps with history  External Data Reviewed: notes.    Details: [facility] hospital nots reviewed  Labs: ordered. Decision-making details documented in ED Course.    Details: Pt complains of elevated wbc count of 11.8  lactic acid is 2.2  INR is greater than 10  Radiology: ordered and independent interpretation performed. Decision-making details documented in ED Course.    Details: Chest xray shows pneumonia  ECG/medicine tests: ordered and independent interpretation performed. Decision-making details documented in ED Course. Discussion of management or test interpretation with external provider(s): Hospitalist consulted and will see for admission   Risk Prescription drug management. Decision regarding  hospitalization. Risk Details: Pt given iv fluids and IV antibiotics.  For pneumonia             Final Clinical Impression(s) / ED Diagnoses Final diagnoses:  Pneumonia due to infectious organism, unspecified laterality, unspecified part of lung    Rx / DC Orders ED Discharge Orders     None      An After Visit Summary was printed and given to the patient.    Sidney Ace 04/14/22 1831    [name], MD 04/14/22 2118

## 2022-04-14 NOTE — ED Triage Notes (Signed)
Pt arrived via [facility] from [place] c/o nausea, L lower abdominal pain, bruise noted on abdomen, and generalized weakness x 2 days, pt was recently diagnosed with pneumonia several days ago, Per EMS, facility states she also has an increased INR, on blood thinners, Hx of cardiac issues, denies falling recently.

## 2022-04-14 NOTE — Sepsis Progress Note (Signed)
Notified provider of need to order repeat lactic acid. ° °

## 2022-04-15 ENCOUNTER — Inpatient Hospital Stay: Payer: Self-pay

## 2022-04-15 DIAGNOSIS — A419 Sepsis, unspecified organism: Secondary | ICD-10-CM | POA: Diagnosis not present

## 2022-04-15 DIAGNOSIS — R791 Abnormal coagulation profile: Secondary | ICD-10-CM | POA: Diagnosis not present

## 2022-04-15 DIAGNOSIS — I5032 Chronic diastolic (congestive) heart failure: Secondary | ICD-10-CM | POA: Diagnosis not present

## 2022-04-15 DIAGNOSIS — N184 Chronic kidney disease, stage 4 (severe): Secondary | ICD-10-CM | POA: Diagnosis not present

## 2022-04-15 LAB — COMPREHENSIVE METABOLIC PANEL
ALT: 18 U/L (ref 0–44)
AST: 42 U/L — ABNORMAL HIGH (ref 15–41)
Albumin: 2.4 g/dL — ABNORMAL LOW (ref 3.5–5.0)
Alkaline Phosphatase: 59 U/L (ref 38–126)
Anion gap: 11 (ref 5–15)
BUN: 34 mg/dL — ABNORMAL HIGH (ref 8–23)
CO2: 21 mmol/L — ABNORMAL LOW (ref 22–32)
Calcium: 7 mg/dL — ABNORMAL LOW (ref 8.9–10.3)
Chloride: 102 mmol/L (ref 98–111)
Creatinine, Ser: 1.93 mg/dL — ABNORMAL HIGH (ref 0.44–1.00)
GFR, Estimated: 25 mL/min — ABNORMAL LOW (ref >60)
Glucose, Bld: 146 mg/dL — ABNORMAL HIGH (ref 70–99)
Potassium: 4.1 mmol/L (ref 3.5–5.1)
Sodium: 134 mmol/L — ABNORMAL LOW (ref 135–145)
Total Bilirubin: 2 mg/dL — ABNORMAL HIGH (ref 0.3–1.2)
Total Protein: 5.7 g/dL — ABNORMAL LOW (ref 6.5–8.1)

## 2022-04-15 LAB — CBC
HCT: 22.6 % — ABNORMAL LOW (ref 36.0–46.0)
Hemoglobin: 7.5 g/dL — ABNORMAL LOW (ref 12.0–15.0)
MCH: 31.5 pg (ref 26.0–34.0)
MCHC: 33.2 g/dL (ref 30.0–36.0)
MCV: 95 fL (ref 80.0–100.0)
Platelets: 157 10*3/uL (ref 150–400)
RBC: 2.38 MIL/uL — ABNORMAL LOW (ref 3.87–5.11)
RDW: 17.7 % — ABNORMAL HIGH (ref 11.5–15.5)
WBC: 12.4 10*3/uL — ABNORMAL HIGH (ref 4.0–10.5)
nRBC: 0 % (ref 0.0–0.2)

## 2022-04-15 LAB — PROTIME-INR
INR: 4.7 (ref 0.8–1.2)
Prothrombin Time: 43.5 seconds — ABNORMAL HIGH (ref 11.4–15.2)

## 2022-04-15 LAB — HEMOGLOBIN AND HEMATOCRIT, BLOOD
HCT: 17.6 % — ABNORMAL LOW (ref 36.0–46.0)
Hemoglobin: 5.9 g/dL — CL (ref 12.0–15.0)

## 2022-04-15 LAB — PREPARE RBC (CROSSMATCH)

## 2022-04-15 MED ORDER — SODIUM CHLORIDE 0.9% FLUSH: 10.0000 mL | INTRAVENOUS | Status: DC | PRN

## 2022-04-15 MED ORDER — SODIUM CHLORIDE 0.9% FLUSH
10.0000 mL | Freq: Two times a day (BID) | INTRAVENOUS | Status: DC
  Administered 2022-04-15 – 2022-04-28 (×27): 10 mL

## 2022-04-15 MED ORDER — SODIUM CHLORIDE 0.9% IV SOLUTION: Freq: Once | INTRAVENOUS | Status: AC

## 2022-04-15 MED ORDER — FENTANYL CITRATE PF 50 MCG/ML IJ SOSY
12.5000 ug | PREFILLED_SYRINGE | INTRAMUSCULAR | Status: DC | PRN
  Administered 2022-04-15 – 2022-04-25 (×21): 12.5 ug via INTRAVENOUS
  Filled 2022-04-15 (×22): qty 1

## 2022-04-15 MED ORDER — SODIUM CHLORIDE 0.9 % IV SOLN: INTRAVENOUS | Status: DC

## 2022-04-15 MED ORDER — VITAMIN K1 10 MG/ML IJ SOLN
5.0000 mg | Freq: Once | INTRAVENOUS | Status: AC
  Administered 2022-04-15: 5 mg via INTRAVENOUS
  Filled 2022-04-15: qty 0.5

## 2022-04-15 NOTE — Progress Notes (Addendum)
PROGRESS NOTE  Mary [name] AOZ:[id number] DOB: 1940-03-22 DOA: 04/14/2022 PCP: Pcp, No  Brief History:  82 year old female with a history of dementia, hypertension, hyperlipidemia, CKD stage III, anxiety, mechanical aortic valve, stroke, diastolic CHF, and anxiety presenting from [place] with generalized weakness for 3 to 4 days.  She has had associated decreased oral intake and nausea.  The patient is a difficult historian.  The patient's spouse is at the bedside.  He is also a difficult historian.  Nevertheless, it appears that the patient was treated for pneumonia.  Review of the [facility] from [place] shows the patient was started on levofloxacin on 04/08/2022 for 5-day course.  Even with the antibiotics, the patient continued to have progressive generalized weakness.  She denies any headache, neck pain, chest pain, hemoptysis, vomiting, diarrhea.  She does complain of abdominal pain.  She states that the abdominal pain has been present for 3 to 4 days.  There is no hematochezia or melena.  She has some dyspnea with exertion. In the ED, the patient was afebrile hemodynamically stable with oxygen saturation 99% on room air.  WBC 10.8, hemoglobin 9.3, platelets 302,000.  Sodium 135, potassium 4.4, bicarbonate 24, serum creatinine 1.77.  The patient was started on IV ceftriaxone and azithromycin and IV fluids.  She was admitted for further evaluation and treatment.   Assessment/Plan: SIRS -concerned about intra-abdominal source of infection initially -CT abd--- Large areas of presumed hematoma involving the rectus abdominis muscles bilaterally, left-greater-than-right. There is also large extraperitoneal hematoma along the margin of the iliopsoas muscle on the left -presented with leukocytosis, tachycardia, elevated lactate -follow blood culture--neg to date -UA neg for pyuria -Lactic acid peaked 2.2 -Continue IV fluids -Start IV Zosyn pending culture data -Chest x-ray without focal  consolidation -sepsis ruled out   Abdominal pain/extraperitoneal/retroperitoneal bleed/ Acute Blood Loss Anemia -CT abdomen and pelvis--- Large areas of presumed hematoma involving the rectus abdominismuscles bilaterally, left-greater-than-right. There is also largeextraperitoneal hematoma along the margin of the iliopsoas muscle on the left -transfused 2 units PRBC -fentanyl prn pain   Supratherapeutic INR -Likely secondary to the patient's recent use of levofloxacin and decreased oral intake in the setting of warfarin use -Vitamin K 15 mg total given -4 units FFP given -Daily INR   Status post mechanical aortic valve replacement -Continue anticoagulation with INR 2.5-3.5 -Holding warfarin temporarily secondary to supratherapeutic INR   Essential hypertension -Holding antihypertensives secondary to soft blood pressures   Dementia without behavioral disturbance -At risk for hospital delirium -Continue Seroquel at bedtime and as needed alprazolam   Mixed hyperlipidemia -Restart simvastatin when able to tolerate p.o.   Anxiety/depression -Restart buspirone   CKD stage IV -Baseline creatinine 1.7 -Monitor daily BMP       Family Communication:   spouse updated 1/27  Consultants:  none  Code Status:  DNR  DVT Prophylaxis:  SCDs   Procedures: As Listed in Progress Note Above  Antibiotics: Zosyn 1/26>>      Subjective:  Pt complains of abd pain.  Denies n/v/d, cp, sob. Objective: Vitals:   04/15/22 1600 04/15/22 1608 04/15/22 1618 04/15/22 1700  BP: (!) 137/49 (!) 144/47  115/74  Pulse: (!) 118 (!) 115  (!) 110  Resp: (!) 26 (!) 26  (!) 28  Temp:  98.4 F (36.9 C) 97.9 F (36.6 C)   TempSrc:  Oral Oral   SpO2: 97% 96%  100%  Weight:      Height:  Intake/Output Summary (Last 24 hours) at 04/15/2022 1715 Last data filed at 04/15/2022 1608 Gross per 24 hour  Intake 3841.51 ml  Output 325 ml  Net 3516.51 ml   Weight change:   Exam:  General:  Pt is alert, follows commands appropriately, not in acute distress HEENT: No icterus, No thrush, No neck mass, [place]/AT Cardiovascular: RRR, S1/S2, no rubs, no gallops Respiratory: bibasilar crackles. No wheeze Abdomen: Soft/+BS, diffuse ecchymosis Extremities: 1 + LE edema, No lymphangitis, No petechiae, No rashes, no synovitis   Data Reviewed: I have personally reviewed following labs and imaging studies Basic Metabolic Panel: Recent Labs  Lab 04/14/22 1000 04/15/22 0455  NA 135 134*  K 4.4 4.1  CL 99 102  CO2 24 21*  GLUCOSE 125* 146*  BUN 24* 34*  CREATININE 1.77* 1.93*  CALCIUM 8.8* 7.0*   Liver Function Tests: Recent Labs  Lab 04/14/22 1000 04/15/22 0455  AST 29 42*  ALT 13 18  ALKPHOS 89 59  BILITOT 1.7* 2.0*  PROT 7.5 5.7*  ALBUMIN 2.9* 2.4*   No results for input(s): "LIPASE", "AMYLASE" in the last 168 hours. No results for input(s): "AMMONIA" in the last 168 hours. Coagulation Profile: Recent Labs  Lab 04/14/22 1124 04/15/22 0455  INR >10.0* 4.7*   CBC: Recent Labs  Lab 04/14/22 1000 04/14/22 1514 04/15/22 0455  WBC 11.8*  --  12.4*  NEUTROABS 9.8*  --   --   HGB 9.3* 6.3* 7.5*  HCT 29.6* 19.9* 22.6*  MCV 101.0*  --  95.0  PLT 302  --  157   Cardiac Enzymes: No results for input(s): "CKTOTAL", "CKMB", "CKMBINDEX", "TROPONINI" in the last 168 hours. BNP: Invalid input(s): "POCBNP" CBG: No results for input(s): "GLUCAP" in the last 168 hours. HbA1C: No results for input(s): "HGBA1C" in the last 72 hours. Urine analysis:    Component Value Date/Time   COLORURINE YELLOW 04/14/2022 2200   APPEARANCEUR CLEAR 04/14/2022 2200   LABSPEC >1.046 (H) 04/14/2022 2200   PHURINE 5.0 04/14/2022 2200   GLUCOSEU NEGATIVE 04/14/2022 2200   HGBUR NEGATIVE 04/14/2022 2200   BILIRUBINUR NEGATIVE 04/14/2022 2200   KETONESUR NEGATIVE 04/14/2022 2200   PROTEINUR NEGATIVE 04/14/2022 2200   UROBILINOGEN 2.0 (H) 11/01/2007 1518   NITRITE  NEGATIVE 04/14/2022 2200   LEUKOCYTESUR NEGATIVE 04/14/2022 2200   Sepsis Labs: @LABRCNTIP (procalcitonin:4,lacticidven:4) ) Recent Results (from the past 240 hour(s))  Blood culture (routine x 2)     Status: None (Preliminary result)   Collection Time: 04/14/22 10:30 AM   Specimen: BLOOD  Result Value Ref Range Status   Specimen Description BLOOD LEFT ASSIST CONTROL  Final   Special Requests   Final    BOTTLES DRAWN AEROBIC AND ANAEROBIC Blood Culture adequate volume   Culture   Final    NO GROWTH < 24 HOURS Performed at [facility], [address] [place], [place] [address]    Report Status PENDING  Incomplete  Blood culture (routine x 2)     Status: None (Preliminary result)   Collection Time: 04/14/22 10:42 AM   Specimen: BLOOD  Result Value Ref Range Status   Specimen Description BLOOD RIGHT ASSIST CONTROL  Final   Special Requests   Final    BOTTLES DRAWN AEROBIC AND ANAEROBIC Blood Culture adequate volume   Culture   Final    NO GROWTH < 24 HOURS Performed at [facility], [address] [place], [place] [address]    Report Status PENDING  Incomplete  Resp panel by  RT-PCR (RSV, Flu A&B, Covid) Anterior Nasal Swab     Status: None   Collection Time: 04/14/22  6:00 PM   Specimen: Anterior Nasal Swab  Result Value Ref Range Status   SARS Coronavirus 2 by RT PCR NEGATIVE NEGATIVE Final    Comment: (NOTE) SARS-CoV-2 target nucleic acids are NOT DETECTED.  The SARS-CoV-2 RNA is generally detectable in upper respiratory specimens during the acute phase of infection. The lowest concentration of SARS-CoV-2 viral copies this assay can detect is 138 copies/mL. A negative result does not preclude SARS-Cov-2 infection and should not be used as the sole basis for treatment or other patient management decisions. A negative result may occur with  improper specimen collection/handling, submission of specimen other than nasopharyngeal swab, presence of viral mutation(s) within  the areas targeted by this assay, and inadequate number of viral copies(<138 copies/mL). A negative result must be combined with clinical observations, patient history, and epidemiological information. The expected result is Negative.  Fact Sheet for Patients:  EntrepreneurPulse.com.au  Fact Sheet for Healthcare Providers:  IncredibleEmployment.be  This test is no t yet approved or cleared by the [place] FDA and  has been authorized for detection and/or diagnosis of SARS-CoV-2 by FDA under an Emergency Use Authorization (EUA). This EUA will remain  in effect (meaning this test can be used) for the duration of the COVID-19 declaration under Section 564(b)(1) of the Act, 21 U.S.C.section 360bbb-3(b)(1), unless the authorization is terminated  or revoked sooner.       Influenza A by PCR NEGATIVE NEGATIVE Final   Influenza B by PCR NEGATIVE NEGATIVE Final    Comment: (NOTE) The Xpert Xpress SARS-CoV-2/FLU/RSV plus assay is intended as an aid in the diagnosis of influenza from Nasopharyngeal swab specimens and should not be used as a sole basis for treatment. Nasal washings and aspirates are unacceptable for Xpert Xpress SARS-CoV-2/FLU/RSV testing.  Fact Sheet for Patients: EntrepreneurPulse.com.au  Fact Sheet for Healthcare Providers: IncredibleEmployment.be  This test is not yet approved or cleared by the [place] FDA and has been authorized for detection and/or diagnosis of SARS-CoV-2 by FDA under an Emergency Use Authorization (EUA). This EUA will remain in effect (meaning this test can be used) for the duration of the COVID-19 declaration under Section 564(b)(1) of the Act, 21 U.S.C. section 360bbb-3(b)(1), unless the authorization is terminated or revoked.     Resp Syncytial Virus by PCR NEGATIVE NEGATIVE Final    Comment: (NOTE) Fact Sheet for  Patients: EntrepreneurPulse.com.au  Fact Sheet for Healthcare Providers: IncredibleEmployment.be  This test is not yet approved or cleared by the [place] FDA and has been authorized for detection and/or diagnosis of SARS-CoV-2 by FDA under an Emergency Use Authorization (EUA). This EUA will remain in effect (meaning this test can be used) for the duration of the COVID-19 declaration under Section 564(b)(1) of the Act, 21 U.S.C. section 360bbb-3(b)(1), unless the authorization is terminated or revoked.  Performed at [facility], [address], [place], [place] [address]   MRSA Next Gen by PCR, Nasal     Status: None   Collection Time: 04/14/22  6:53 PM   Specimen: Nasal Mucosa; Nasal Swab  Result Value Ref Range Status   MRSA by PCR Next Gen NOT DETECTED NOT DETECTED Final    Comment: (NOTE) The GeneXpert MRSA Assay (FDA approved for NASAL specimens only), is one component of a comprehensive MRSA colonization surveillance program. It is not intended to diagnose MRSA infection nor to guide or monitor treatment  for MRSA infections. Test performance is not FDA approved in patients less than 49 years old. Performed at [facility], [address] [place],  [address]      Scheduled Meds:  buPROPion  150 mg Oral q AM   busPIRone  10 mg Oral BID   Chlorhexidine Gluconate Cloth  6 each Topical Daily   pantoprazole  40 mg Oral QAC breakfast   QUEtiapine  25 mg Oral QHS   sodium bicarbonate  650 mg Oral TID   sodium chloride flush  10-40 mL Intracatheter Q12H   Continuous Infusions:  sodium chloride 50 mL/hr at 04/15/22 1712   azithromycin 500 mg (04/15/22 1033)   piperacillin-tazobactam (ZOSYN)  IV 3.375 g (04/15/22 1402)    Procedures/Studies: [place] EKG SITE RITE  Result Date: 04/15/2022 If Site Rite image not attached, placement could not be confirmed due to current cardiac rhythm.  CT Angio Abd/Pel w/ and/or w/o  Result Date:  04/14/2022 CLINICAL DATA:  Retroperitoneal bleed suspected, extraperitoneal bleed. EXAM: CTA ABDOMEN AND PELVIS WITHOUT AND WITH CONTRAST TECHNIQUE: Multidetector CT imaging of the abdomen and pelvis was performed using the standard protocol during bolus administration of intravenous contrast. Multiplanar reconstructed images and MIPs were obtained and reviewed to evaluate the vascular anatomy. RADIATION DOSE REDUCTION: This exam was performed according to the departmental dose-optimization program which includes automated exposure control, adjustment of the mA and/or kV according to patient size and/or use of iterative reconstruction technique. CONTRAST:  80mL OMNIPAQUE IOHEXOL 350 MG/ML SOLN COMPARISON:  Noncontrast CT earlier today. FINDINGS: VASCULAR Aorta: Moderate atherosclerosis. No aneurysm, dissection or acute findings. Celiac: Calcified plaque at the origin causes mild stenosis. Distal branch vessels are patent. No aneurysm, dissection or acute findings. SMA: Mild diffuse atheromatous plaque with mild areas of stenosis. No aneurysm or acute findings. Renals: Mild plaque in the proximal renal arteries causing varying degrees of stenosis up to moderate. No aneurysm or acute findings. IMA: Patent. Inflow: Mild atherosclerosis without acute findings. Proximal Outflow: Bilateral common femoral and visualized portions of the superficial and profunda femoral arteries are patent without evidence of aneurysm, dissection, vasculitis or significant stenosis. Retroperitoneal and extraperitoneal hematomas. There is no active arterial extravasation within any of the retroperitoneal or extraperitoneal bleed. Veins: Venous phase imaging demonstrates patency of the iliac veins and IVC. Portal, splenic and mesenteric veins are patent. No areas of active extravasation or seen on venous phase. Review of the MIP images confirms the above findings. NON-VASCULAR Lower chest: Right pleural effusion with pleural enhancement,  unchanged from earlier today. Hepatobiliary: Nodular hepatic contours typical of cirrhosis. Water density there is a 13 mm low-density lesion in the left lobe of the liver series 12, image 21, measuring slightly higher than simple fluid density, nonspecific. Multiple gallstones without evidence of gallbladder inflammation. Pancreas: Parenchymal atrophy. No ductal dilatation or inflammation. Spleen: No splenomegaly. Adrenals/Urinary Tract: No adrenal nodule or hemorrhage. Bilateral renal parenchymal atrophy with multifocal areas of parenchymal scarring. Parenchymal calcifications in the upper pole the right kidney. No hydronephrosis. Displaced to the right due to left pelvic fluid collections. The bladder appears thick walled which is nonspecific. Stomach/Bowel: No bowel obstruction or inflammation. There is mild colonic distension with stool proximally and air distally. The appendix is not seen. Lymphatic: No gross adenopathy. Reproductive: Uterine vascular calcifications again seen. Other: Again seen multiple areas of presume extraperitoneal and retroperitoneal hematoma/hemorrhage. Largest area is in the left pelvis measuring approximately 12.9 x 7 cm with hematocrit level, measured on series 12, image 78.  This may be contiguous with the left rectus abdominus musculature. More superiorly is an area of presumed rectus hematoma measuring 6.1 x 5.1 cm, measured on series 12, image 67. There is stranding in the left retroperitoneum tracking anterior to the iliopsoas muscle that is primarily ill-defined. Stranding within the right extraperitoneal M with ill-defined hemorrhage. Fluid collections within the upper abdominal wall musculature just underneath the anterior ribs, 8.7 x 3.3 cm on the left series 12, image 30, 4.0 x 2.7 cm on the right series 12, image 31. There is no evidence of active extravasation within any of these hematomas. There is edema within the subcutaneous tissues of the left flank/abdominal wall as  well as anterior to the lower abdominal wall musculature. Musculoskeletal: Unchanged from earlier today with L4 compression deformity. Left hip arthroplasty. IMPRESSION: 1. No evidence of active extravasation within any of the retroperitoneal or extraperitoneal fluid collections/presumed hematoma. 2. Multiple areas of presume extraperitoneal and retroperitoneal hematoma/hemorrhage, largest area in the left pelvis measuring 12.9 x 7 cm, likely contiguous with the left rectus abdominus musculature. Allowing for differences in caliper placement in technique, no significant change in size of these collections from earlier today. Possibility of infection is considered but felt less likely. 3. Cirrhosis. Indeterminate 13 mm low-density lesion in the left lobe of the liver measuring slightly higher than simple fluid density. Recommend further evaluation with MRI on a nonemergent basis after resolution of acute event. 4. Cholelithiasis without acute cholecystitis. 5. Aortic and branch atherosclerosis with areas of arterial stenosis up to moderate in degree, as described. 6. Additional chronic changes are stable from earlier today. Aortic Atherosclerosis (ICD10-I70.0). Electronically Signed   By: Keith Rake M.D.   On: 04/14/2022 16:41   CT CHEST ABDOMEN PELVIS WO CONTRAST  Result Date: 04/14/2022 CLINICAL DATA:  Generalized pain.  Sepsis.  Nausea. EXAM: CT CHEST, ABDOMEN AND PELVIS WITHOUT CONTRAST TECHNIQUE: Multidetector CT imaging of the chest, abdomen and pelvis was performed following the standard protocol without IV contrast. RADIATION DOSE REDUCTION: This exam was performed according to the departmental dose-optimization program which includes automated exposure control, adjustment of the mA and/or kV according to patient size and/or use of iterative reconstruction technique. COMPARISON:  Chest x-ray earlier 04/14/2022. Older x-rays. CT scan abdomen pelvis 2017 July FINDINGS: CT CHEST FINDINGS  Cardiovascular: Heart is enlarged. Trace pericardial fluid. Prominent calcifications along the mitral valve. Postop chest with prosthetic aortic valve. The thoracic aorta has scattered calcified atherosclerotic plaque. Diameter of the ascending aorta at the level of the right pulmonary artery measures 4.3 by 4.0 cm. Mediastinum/Nodes: Small thyroid gland. No specific abnormal lymph node enlargement present in the axillary region, hila or mediastinum on this noncontrast exam. Prominent subcarinal node is noted measuring 17 by 10 mm on series 2, image 30. Normal caliber thoracic esophagus. Lungs/Pleura: Diffuse breathing motion. Small right-sided pleural effusion identified with some adjacent parenchymal opacities. Associated pleural thickening. This effusion was seen in 2017 and with slightly larger that time. More thickening today of the pleura. Atelectasis versus infiltrate. There is some bandlike areas of opacity as well in the middle lobe. Scattered areas of interstitial septal thickening are identified. There are few patchy ground-glass areas as well in the right upper lobe towards the apex such as series 3, image 42, series 3, image 53. Additional areas scattered in the left upper lobe, middle lobe and superior segment of left lower lobe. This has a differential including atypical or viral process. Recommend follow-up. Musculoskeletal: Osteopenia. Scattered degenerative changes noted  along the spine. Of note the patient's arms were scanned at the patient's side. CT ABDOMEN PELVIS FINDINGS Hepatobiliary: Nodular contour to the liver. Please correlate for any known history of chronic liver disease. There are multiple stones in the nondilated gallbladder. Pancreas: Moderate pancreatic atrophy. No obvious mass. Appearance is similar to prior. Spleen: Spleen is grossly nonenlarged. Adrenals/Urinary Tract: Adrenal glands are grossly preserved. There is mild bilateral renal atrophy with some focal areas along the  midportion left kidney laterally. Some calcifications noted along the hilum of the right kidney which very well could be vascular. No definite ureteral or renal stones otherwise. The bladder is somewhat decompressed with wall thickening but there is mass effect along the bladder from the adjacent fluid collections. Stomach/Bowel: Stomach is mildly distended with fluid. The small bowel is nondilated. Large bowel is nondilated prominent right-sided colonic stool. Again evaluation is limited by significant motion. Vascular/Lymphatic: Diffuse vascular calcifications are identified with areas of significant stenosis suggested along branch vessels including SMA, renal arteries. Normal caliber IVC. Reproductive: Scattered calcifications along the uterus. These could be vascular. No separate adnexal mass. Other: Significant motion noted limited evaluation. Extensive areas of presumed hematoma identified. These include the rectus abdominis muscles bilaterally, left-greater-than-right. The left hematoma extends along the course of the entirety of the rectus abdominis muscle, cephalocaudal length approaching 29 cm. Transverse dimension at the level of the pelvis approximate 7.4 by 4.8 cm. Again the right-sided areas are smaller and are along the lower chest and upper abdomen regions. There is also a large extraperitoneal hematoma more on the left than on the right with mass effect along surrounding structures including the bladder. This extends along the margin of the iliacus muscle. Longitudinal length on coronal image 89 of series 5 measures a proximally 18.2 cm. Axial dimension maximally approaches 10.5 by 5.5 cm. Anasarca. Musculoskeletal: Streak artifact related to patient's left hip arthroplasty obscuring the surrounding tissues. Curvature and degenerative changes seen along the spine. Osteopenia. Multilevel stenosis identified along the lumbar spine. There is moderate compression of the superior endplate of L4 which was  not seen in 2017 per the margins are somewhat sclerotic. Favor a subacute to chronic process. Please correlate with history. There is multilevel disc bulging. Degenerative changes seen along the pelvis. Critical Value/emergent results were called by telephone at the time of interpretation on 04/14/2022 at 1:59 pm to provider Graylyn Bunney , who verbally acknowledged these results. IMPRESSION: 1. Large areas of presumed hematoma involving the rectus abdominis muscles bilaterally, left-greater-than-right. There is also large extraperitoneal hematoma along the margin of the iliopsoas muscle on the left. This extends along the margin of the bladder with mass effect. 2. Small right-sided pleural effusion with adjacent parenchymal opacities. This effusion was seen in 2017 and with slightly larger that time. More thickening of the pleura today. Atelectasis versus infiltrate. 3. Patchy areas of ground-glass in both lungs. This has a differential including atypical or viral process. Recommend follow-up. 4. Cardiomegaly.  Postop chest. 5. Ascending aortic aneurysm measuring 4.3 x 4.0 cm. 6. Nodular contour to the liver. Please correlate for any known history of chronic liver disease. 7. Cholelithiasis. 8. Diffuse vascular calcifications with areas of significant stenosis along branch vessels including SMA, renal arteries. 9. Moderate compression of the superior endplate of L4, not seen in 2017 per the margins are somewhat sclerotic. Favor a subacute to chronic process. Please correlate with history. 10. Aortic atherosclerosis. Aortic Atherosclerosis (ICD10-I70.0). Electronically Signed   By: Jill Side M.D.   On:  04/14/2022 14:04   DG Chest 2 View  Result Date: 04/14/2022 CLINICAL DATA:  Fall EXAM: CHEST - 2 VIEW COMPARISON:  Chest x-ray dated September 23, 2021 FINDINGS: Cardiac and mediastinal contours are unchanged post median sternotomy. Left-greater-than-right pleural effusions. No evidence of pneumothorax. Chronic appearing  anterior second right rib fracture. IMPRESSION: Left-greater-than-right pleural effusions bibasilar atelectasis, similar to prior. Electronically Signed   By: Yetta Glassman M.D.   On: 04/14/2022 10:04    Orson Eva, DO  [facility]  If 7PM-7AM, please contact night-coverage [email or web address] Password [email or web address] 04/15/2022, 5:15 PM   LOS: 1 day

## 2022-04-15 NOTE — Progress Notes (Signed)
Notified phlebotomy that labs can be drawn at 0445, 2 hours after final blood transfusion completed.

## 2022-04-15 NOTE — Progress Notes (Signed)
Date and time results received: 04/15/22 1807 (use smartphrase ".now" to insert current time)  Test: Hemoglobin Critical Value: 5.9  Name of Provider Notified: Tat, MD.  Orders Received? Or Actions Taken?: Orders Received - See Orders for details

## 2022-04-15 NOTE — Progress Notes (Signed)
Date and time results received: 04/15/22 1020 (use smartphrase ".now" to insert current time)  Test: INR Critical Value: 4.7  Name of Provider Notified: Dr. Carles Collet  Orders Received? Or Actions Taken?: Vit K and 2 units FFP ordered.

## 2022-04-15 NOTE — Progress Notes (Signed)
Peripherally Inserted Central Catheter Placement  The IV Nurse has discussed with the patient and/or persons authorized to consent for the patient, the purpose of this procedure and the potential benefits and risks involved with this procedure.  The benefits include less needle sticks, lab draws from the catheter, and the patient may be discharged home with the catheter. Risks include, but not limited to, infection, bleeding, blood clot (thrombus formation), and puncture of an artery; nerve damage and irregular heartbeat and possibility to perform a PICC exchange if needed/ordered by physician.  Alternatives to this procedure were also discussed.  Bard Power PICC patient education guide, fact sheet on infection prevention and patient information card has been provided to patient /or left at bedside.    PICC Placement Documentation  PICC Double Lumen 16/60/63 Right Basilic 37 cm 0 cm (Active)  Indication for Insertion or Continuance of Line Vasoactive infusions 04/15/22 1226  Exposed Catheter (cm) 0 cm 04/15/22 1226  Site Assessment Clean, Dry, Intact 04/15/22 1226  Lumen #1 Status Flushed;Saline locked;Blood return noted 04/15/22 1226  Lumen #2 Status Flushed;Blood return noted;Saline locked 04/15/22 1226  Dressing Type Transparent;Securing device 04/15/22 1226  Dressing Status Antimicrobial disc in place 04/15/22 1226  Safety Lock Not Applicable 01/60/10 9323  Dressing Change Due 04/22/22 04/15/22 1226       Frances Maywood 04/15/2022, 12:29 PM

## 2022-04-16 DIAGNOSIS — Z952 Presence of prosthetic heart valve: Secondary | ICD-10-CM | POA: Diagnosis not present

## 2022-04-16 DIAGNOSIS — N179 Acute kidney failure, unspecified: Secondary | ICD-10-CM

## 2022-04-16 DIAGNOSIS — J690 Pneumonitis due to inhalation of food and vomit: Secondary | ICD-10-CM | POA: Insufficient documentation

## 2022-04-16 DIAGNOSIS — A419 Sepsis, unspecified organism: Secondary | ICD-10-CM | POA: Diagnosis not present

## 2022-04-16 DIAGNOSIS — R791 Abnormal coagulation profile: Secondary | ICD-10-CM | POA: Diagnosis not present

## 2022-04-16 DIAGNOSIS — I5032 Chronic diastolic (congestive) heart failure: Secondary | ICD-10-CM | POA: Diagnosis not present

## 2022-04-16 LAB — BPAM FFP
Blood Product Expiration Date: 202401312359
Blood Product Expiration Date: 202401312359
ISSUE DATE / TIME: 202401261642
ISSUE DATE / TIME: 202401261850
Unit Type and Rh: 6200
Unit Type and Rh: 6200

## 2022-04-16 LAB — CBC
HCT: 22.7 % — ABNORMAL LOW (ref 36.0–46.0)
Hemoglobin: 7.8 g/dL — ABNORMAL LOW (ref 12.0–15.0)
MCH: 31.7 pg (ref 26.0–34.0)
MCHC: 34.4 g/dL (ref 30.0–36.0)
MCV: 92.3 fL (ref 80.0–100.0)
Platelets: 121 10*3/uL — ABNORMAL LOW (ref 150–400)
RBC: 2.46 MIL/uL — ABNORMAL LOW (ref 3.87–5.11)
RDW: 17.3 % — ABNORMAL HIGH (ref 11.5–15.5)
WBC: 13.7 10*3/uL — ABNORMAL HIGH (ref 4.0–10.5)
nRBC: 0 % (ref 0.0–0.2)

## 2022-04-16 LAB — BASIC METABOLIC PANEL
Anion gap: 10 (ref 5–15)
BUN: 49 mg/dL — ABNORMAL HIGH (ref 8–23)
CO2: 21 mmol/L — ABNORMAL LOW (ref 22–32)
Calcium: 6.8 mg/dL — ABNORMAL LOW (ref 8.9–10.3)
Chloride: 102 mmol/L (ref 98–111)
Creatinine, Ser: 2.34 mg/dL — ABNORMAL HIGH (ref 0.44–1.00)
GFR, Estimated: 20 mL/min — ABNORMAL LOW (ref >60)
Glucose, Bld: 164 mg/dL — ABNORMAL HIGH (ref 70–99)
Potassium: 3.4 mmol/L — ABNORMAL LOW (ref 3.5–5.1)
Sodium: 133 mmol/L — ABNORMAL LOW (ref 135–145)

## 2022-04-16 LAB — PREPARE FRESH FROZEN PLASMA
Unit division: 0
Unit division: 0

## 2022-04-16 LAB — PROTIME-INR
INR: 2.3 — ABNORMAL HIGH (ref 0.8–1.2)
Prothrombin Time: 24.8 seconds — ABNORMAL HIGH (ref 11.4–15.2)

## 2022-04-16 LAB — HEMOGLOBIN AND HEMATOCRIT, BLOOD
HCT: 22.6 % — ABNORMAL LOW (ref 36.0–46.0)
Hemoglobin: 7.6 g/dL — ABNORMAL LOW (ref 12.0–15.0)

## 2022-04-16 LAB — MAGNESIUM: Magnesium: 2.1 mg/dL (ref 1.7–2.4)

## 2022-04-16 LAB — PROCALCITONIN: Procalcitonin: 1.04 ng/mL

## 2022-04-16 LAB — BRAIN NATRIURETIC PEPTIDE: B Natriuretic Peptide: 528 pg/mL — ABNORMAL HIGH (ref 0.0–100.0)

## 2022-04-16 MED ORDER — POTASSIUM CHLORIDE CRYS ER 20 MEQ PO TBCR
20.0000 meq | EXTENDED_RELEASE_TABLET | Freq: Once | ORAL | Status: AC
  Administered 2022-04-16: 20 meq via ORAL
  Filled 2022-04-16: qty 1

## 2022-04-16 MED ORDER — SODIUM CHLORIDE 0.9% IV SOLUTION: Freq: Once | INTRAVENOUS | Status: AC

## 2022-04-16 MED ORDER — AMOXICILLIN-POT CLAVULANATE 500-125 MG PO TABS
1.0000 | ORAL_TABLET | Freq: Two times a day (BID) | ORAL | Status: AC
  Administered 2022-04-17 – 2022-04-22 (×11): 1 via ORAL
  Filled 2022-04-16 (×13): qty 1

## 2022-04-16 MED ORDER — PIPERACILLIN-TAZOBACTAM IN DEX 2-0.25 GM/50ML IV SOLN
2.2500 g | Freq: Four times a day (QID) | INTRAVENOUS | Status: AC
  Administered 2022-04-16 – 2022-04-17 (×4): 2.25 g via INTRAVENOUS
  Filled 2022-04-16 (×5): qty 50

## 2022-04-16 NOTE — Progress Notes (Signed)
PROGRESS NOTE  Mary [name] NID:[id number] DOB: September 24, 1940 DOA: 04/14/2022 PCP: Pcp, No  Brief History:  82 year old female with a history of dementia, hypertension, hyperlipidemia, CKD stage III, anxiety, mechanical aortic valve, stroke, diastolic CHF, and anxiety presenting from [facility] with generalized weakness for 3 to 4 days.  She has had associated decreased oral intake and nausea.  The patient is a difficult historian.  The patient's spouse is at the bedside.  He is also a difficult historian.  Nevertheless, it appears that the patient was treated for pneumonia.  Review of the [facility] from [place] shows the patient was started on levofloxacin on 04/08/2022 for 5-day course.  Even with the antibiotics, the patient continued to have progressive generalized weakness.  She denies any headache, neck pain, chest pain, hemoptysis, vomiting, diarrhea.  She does complain of abdominal pain.  She states that the abdominal pain has been present for 3 to 4 days.  There is no hematochezia or melena.  She has some dyspnea with exertion. In the ED, the patient was afebrile hemodynamically stable with oxygen saturation 99% on room air.  WBC 10.8, hemoglobin 9.3, platelets 302,000.  Sodium 135, potassium 4.4, bicarbonate 24, serum creatinine 1.77.  The patient was started on IV ceftriaxone and azithromycin and IV fluids.  She was admitted for further evaluation and treatment.   Assessment/Plan:  SIRS -concerned about intra-abdominal source of infection initially -CT abd--- Large areas of presumed hematoma involving the rectus abdominis muscles bilaterally, left-greater-than-right. There is also large extraperitoneal hematoma along the margin of the iliopsoas muscle on the left -presented with leukocytosis, tachycardia, elevated lactate -follow blood culture--neg to date -UA neg for pyuria -Lactic acid peaked 2.2 -Continue IV fluids -Started IV Zosyn pending culture data -Chest x-ray without  focal consolidation -sepsis ruled out   Abdominal pain/extraperitoneal/retroperitoneal bleed/ Acute Blood Loss Anemia -CT abdomen and pelvis--- Large areas of presumed hematoma involving the rectus abdominismuscles bilaterally, left-greater-than-right. There is also largeextraperitoneal hematoma along the margin of the iliopsoas muscle on the left -transfused 4 units PRBC total for the admission -fentanyl prn pain   Supratherapeutic INR -Likely secondary to the patient's recent use of levofloxacin and decreased oral intake in the setting of warfarin use -Vitamin K 15 mg total given -6 units FFP given  total -Daily INR   Status post mechanical aortic valve replacement -Continue anticoagulation with INR 2.5-3.5 -Holding warfarin temporarily secondary to supratherapeutic INR  Aspiration pneumonitis -initially started on zosyn -transition to amox/clav   Essential hypertension -Holding antihypertensives secondary to soft blood pressures   Dementia without behavioral disturbance -At risk for hospital delirium -Continue Seroquel at bedtime and as needed alprazolam   Mixed hyperlipidemia -Restart simvastatin when able to tolerate p.o.   Anxiety/depression -Restart buspirone   Acute on chronic renal failure--CKD stage IV -Baseline creatinine 1.7 -due to hemodynamic changes, IV contrast -Monitor daily BMP  Thrombocytopenia -due to acute medical illness -serial CBC  Hypokalemia -replete -mag 2.1   Goals of Care -confirmed with patient, daughter, spouse>>DNR       Family Communication:   daughter updated 1/28   Consultants:  none   Code Status:  DNR   DVT Prophylaxis:  SCDs     Procedures: As Listed in Progress Note Above   Antibiotics: Zosyn 1/26>>1/29 Amox/clav 1/29>> Azithro 1/26>>            Subjective: Pt complains of abd pain and some sob.  Denies f/c, cp, n/v/d.  +  BM.  No hematochezia, melena  Objective: Vitals:   04/16/22 1100 04/16/22  1151 04/16/22 1300 04/16/22 1320  BP: (!) 124/52  (!) 135/57 (!) 126/50  Pulse: 95 99 (!) 102 (!) 103  Resp: (!) 29 (!) 32 (!) 34 (!) 32  Temp:  97.9 F (36.6 C) 97.9 F (36.6 C) 97.7 F (36.5 C)  TempSrc:  Oral Oral   SpO2: 97% 98% 97%   Weight:      Height:        Intake/Output Summary (Last 24 hours) at 04/16/2022 1330 Last data filed at 04/16/2022 1129 Gross per 24 hour  Intake 2754.15 ml  Output 300 ml  Net 2454.15 ml   Weight change:  Exam:  General:  Pt is alert, follows commands appropriately, not in acute distress HEENT: No icterus, No thrush, No neck mass, [place]/AT Cardiovascular: RRR, S1/S2, no rubs, no gallops Respiratory: bibasilar rales. No wheeze Abdomen: Soft/+BS, non tender, non distended, no guarding Extremities: trace LE edema, No lymphangitis, No petechiae, No rashes, no synovitis   Data Reviewed: I have personally reviewed following labs and imaging studies Basic Metabolic Panel: Recent Labs  Lab 04/14/22 1000 04/15/22 0455 04/16/22 0520  NA 135 134* 133*  K 4.4 4.1 3.4*  CL 99 102 102  CO2 24 21* 21*  GLUCOSE 125* 146* 164*  BUN 24* 34* 49*  CREATININE 1.77* 1.93* 2.34*  CALCIUM 8.8* 7.0* 6.8*  MG  --   --  2.1   Liver Function Tests: Recent Labs  Lab 04/14/22 1000 04/15/22 0455  AST 29 42*  ALT 13 18  ALKPHOS 89 59  BILITOT 1.7* 2.0*  PROT 7.5 5.7*  ALBUMIN 2.9* 2.4*   No results for input(s): "LIPASE", "AMYLASE" in the last 168 hours. No results for input(s): "AMMONIA" in the last 168 hours. Coagulation Profile: Recent Labs  Lab 04/14/22 1124 04/15/22 0455 04/16/22 0520  INR >10.0* 4.7* 2.3*   CBC: Recent Labs  Lab 04/14/22 1000 04/14/22 1514 04/15/22 0455 04/15/22 1738 04/16/22 0520 04/16/22 1122  WBC 11.8*  --  12.4*  --  13.7*  --   NEUTROABS 9.8*  --   --   --   --   --   HGB 9.3* 6.3* 7.5* 5.9* 7.8* 7.6*  HCT 29.6* 19.9* 22.6* 17.6* 22.7* 22.6*  MCV 101.0*  --  95.0  --  92.3  --   PLT 302  --  157  --  121*   --    Cardiac Enzymes: No results for input(s): "CKTOTAL", "CKMB", "CKMBINDEX", "TROPONINI" in the last 168 hours. BNP: Invalid input(s): "POCBNP" CBG: No results for input(s): "GLUCAP" in the last 168 hours. HbA1C: No results for input(s): "HGBA1C" in the last 72 hours. Urine analysis:    Component Value Date/Time   COLORURINE YELLOW 04/14/2022 2200   APPEARANCEUR CLEAR 04/14/2022 2200   LABSPEC >1.046 (H) 04/14/2022 2200   PHURINE 5.0 04/14/2022 2200   GLUCOSEU NEGATIVE 04/14/2022 2200   HGBUR NEGATIVE 04/14/2022 2200   BILIRUBINUR NEGATIVE 04/14/2022 2200   KETONESUR NEGATIVE 04/14/2022 2200   PROTEINUR NEGATIVE 04/14/2022 2200   UROBILINOGEN 2.0 (H) 11/01/2007 1518   NITRITE NEGATIVE 04/14/2022 2200   LEUKOCYTESUR NEGATIVE 04/14/2022 2200   Sepsis Labs: @LABRCNTIP (procalcitonin:4,lacticidven:4) ) Recent Results (from the past 240 hour(s))  Blood culture (routine x 2)     Status: None (Preliminary result)   Collection Time: 04/14/22 10:30 AM   Specimen: BLOOD  Result Value Ref Range Status   Specimen Description BLOOD  LEFT ASSIST CONTROL  Final   Special Requests   Final    BOTTLES DRAWN AEROBIC AND ANAEROBIC Blood Culture adequate volume   Culture   Final    NO GROWTH 2 DAYS Performed at [facility], [address], [place], [place] [address]    Report Status PENDING  Incomplete  Blood culture (routine x 2)     Status: None (Preliminary result)   Collection Time: 04/14/22 10:42 AM   Specimen: BLOOD  Result Value Ref Range Status   Specimen Description BLOOD RIGHT ASSIST CONTROL  Final   Special Requests   Final    BOTTLES DRAWN AEROBIC AND ANAEROBIC Blood Culture adequate volume   Culture   Final    NO GROWTH 2 DAYS Performed at [facility], [address], [place], [place] [address]    Report Status PENDING  Incomplete  Resp panel by RT-PCR (RSV, Flu A&B, Covid) Anterior Nasal Swab     Status: None   Collection Time: 04/14/22  6:00 PM   Specimen: Anterior  Nasal Swab  Result Value Ref Range Status   SARS Coronavirus 2 by RT PCR NEGATIVE NEGATIVE Final    Comment: (NOTE) SARS-CoV-2 target nucleic acids are NOT DETECTED.  The SARS-CoV-2 RNA is generally detectable in upper respiratory specimens during the acute phase of infection. The lowest concentration of SARS-CoV-2 viral copies this assay can detect is 138 copies/mL. A negative result does not preclude SARS-Cov-2 infection and should not be used as the sole basis for treatment or other patient management decisions. A negative result may occur with  improper specimen collection/handling, submission of specimen other than nasopharyngeal swab, presence of viral mutation(s) within the areas targeted by this assay, and inadequate number of viral copies(<138 copies/mL). A negative result must be combined with clinical observations, patient history, and epidemiological information. The expected result is Negative.  Fact Sheet for Patients:  EntrepreneurPulse.com.au  Fact Sheet for Healthcare Providers:  IncredibleEmployment.be  This test is no t yet approved or cleared by the [place] FDA and  has been authorized for detection and/or diagnosis of SARS-CoV-2 by FDA under an Emergency Use Authorization (EUA). This EUA will remain  in effect (meaning this test can be used) for the duration of the COVID-19 declaration under Section 564(b)(1) of the Act, 21 U.S.C.section 360bbb-3(b)(1), unless the authorization is terminated  or revoked sooner.       Influenza A by PCR NEGATIVE NEGATIVE Final   Influenza B by PCR NEGATIVE NEGATIVE Final    Comment: (NOTE) The Xpert Xpress SARS-CoV-2/FLU/RSV plus assay is intended as an aid in the diagnosis of influenza from Nasopharyngeal swab specimens and should not be used as a sole basis for treatment. Nasal washings and aspirates are unacceptable for Xpert Xpress SARS-CoV-2/FLU/RSV testing.  Fact Sheet for  Patients: EntrepreneurPulse.com.au  Fact Sheet for Healthcare Providers: IncredibleEmployment.be  This test is not yet approved or cleared by the [place] FDA and has been authorized for detection and/or diagnosis of SARS-CoV-2 by FDA under an Emergency Use Authorization (EUA). This EUA will remain in effect (meaning this test can be used) for the duration of the COVID-19 declaration under Section 564(b)(1) of the Act, 21 U.S.C. section 360bbb-3(b)(1), unless the authorization is terminated or revoked.     Resp Syncytial Virus by PCR NEGATIVE NEGATIVE Final    Comment: (NOTE) Fact Sheet for Patients: EntrepreneurPulse.com.au  Fact Sheet for Healthcare Providers: IncredibleEmployment.be  This test is not yet approved or cleared by the [place] FDA  and has been authorized for detection and/or diagnosis of SARS-CoV-2 by FDA under an Emergency Use Authorization (EUA). This EUA will remain in effect (meaning this test can be used) for the duration of the COVID-19 declaration under Section 564(b)(1) of the Act, 21 U.S.C. section 360bbb-3(b)(1), unless the authorization is terminated or revoked.  Performed at [facility], [address], [place], [place] [address]   MRSA Next Gen by PCR, Nasal     Status: None   Collection Time: 04/14/22  6:53 PM   Specimen: Nasal Mucosa; Nasal Swab  Result Value Ref Range Status   MRSA by PCR Next Gen NOT DETECTED NOT DETECTED Final    Comment: (NOTE) The GeneXpert MRSA Assay (FDA approved for NASAL specimens only), is one component of a comprehensive MRSA colonization surveillance program. It is not intended to diagnose MRSA infection nor to guide or monitor treatment for MRSA infections. Test performance is not FDA approved in patients less than 81 years old. Performed at [facility], [address], [place], [place] [address]      Scheduled Meds:  buPROPion   150 mg Oral q AM   busPIRone  10 mg Oral BID   Chlorhexidine Gluconate Cloth  6 each Topical Daily   pantoprazole  40 mg Oral QAC breakfast   QUEtiapine  25 mg Oral QHS   sodium bicarbonate  650 mg Oral TID   sodium chloride flush  10-40 mL Intracatheter Q12H   Continuous Infusions:  sodium chloride 50 mL/hr at 04/16/22 1129   azithromycin 500 mg (04/16/22 1129)   piperacillin-tazobactam (ZOSYN)  IV      Procedures/Studies: [place] EKG SITE RITE  Result Date: 04/15/2022 If Site Rite image not attached, placement could not be confirmed due to current cardiac rhythm.  CT Angio Abd/Pel w/ and/or w/o  Result Date: 04/14/2022 CLINICAL DATA:  Retroperitoneal bleed suspected, extraperitoneal bleed. EXAM: CTA ABDOMEN AND PELVIS WITHOUT AND WITH CONTRAST TECHNIQUE: Multidetector CT imaging of the abdomen and pelvis was performed using the standard protocol during bolus administration of intravenous contrast. Multiplanar reconstructed images and MIPs were obtained and reviewed to evaluate the vascular anatomy. RADIATION DOSE REDUCTION: This exam was performed according to the departmental dose-optimization program which includes automated exposure control, adjustment of the mA and/or kV according to patient size and/or use of iterative reconstruction technique. CONTRAST:  65mL OMNIPAQUE IOHEXOL 350 MG/ML SOLN COMPARISON:  Noncontrast CT earlier today. FINDINGS: VASCULAR Aorta: Moderate atherosclerosis. No aneurysm, dissection or acute findings. Celiac: Calcified plaque at the origin causes mild stenosis. Distal branch vessels are patent. No aneurysm, dissection or acute findings. SMA: Mild diffuse atheromatous plaque with mild areas of stenosis. No aneurysm or acute findings. Renals: Mild plaque in the proximal renal arteries causing varying degrees of stenosis up to moderate. No aneurysm or acute findings. IMA: Patent. Inflow: Mild atherosclerosis without acute findings. Proximal Outflow: Bilateral common  femoral and visualized portions of the superficial and profunda femoral arteries are patent without evidence of aneurysm, dissection, vasculitis or significant stenosis. Retroperitoneal and extraperitoneal hematomas. There is no active arterial extravasation within any of the retroperitoneal or extraperitoneal bleed. Veins: Venous phase imaging demonstrates patency of the iliac veins and IVC. Portal, splenic and mesenteric veins are patent. No areas of active extravasation or seen on venous phase. Review of the MIP images confirms the above findings. NON-VASCULAR Lower chest: Right pleural effusion with pleural enhancement, unchanged from earlier today. Hepatobiliary: Nodular hepatic contours typical of cirrhosis. Water density there is a 13 mm  low-density lesion in the left lobe of the liver series 12, image 21, measuring slightly higher than simple fluid density, nonspecific. Multiple gallstones without evidence of gallbladder inflammation. Pancreas: Parenchymal atrophy. No ductal dilatation or inflammation. Spleen: No splenomegaly. Adrenals/Urinary Tract: No adrenal nodule or hemorrhage. Bilateral renal parenchymal atrophy with multifocal areas of parenchymal scarring. Parenchymal calcifications in the upper pole the right kidney. No hydronephrosis. Displaced to the right due to left pelvic fluid collections. The bladder appears thick walled which is nonspecific. Stomach/Bowel: No bowel obstruction or inflammation. There is mild colonic distension with stool proximally and air distally. The appendix is not seen. Lymphatic: No gross adenopathy. Reproductive: Uterine vascular calcifications again seen. Other: Again seen multiple areas of presume extraperitoneal and retroperitoneal hematoma/hemorrhage. Largest area is in the left pelvis measuring approximately 12.9 x 7 cm with hematocrit level, measured on series 12, image 78. This may be contiguous with the left rectus abdominus musculature. More superiorly is an  area of presumed rectus hematoma measuring 6.1 x 5.1 cm, measured on series 12, image 67. There is stranding in the left retroperitoneum tracking anterior to the iliopsoas muscle that is primarily ill-defined. Stranding within the right extraperitoneal M with ill-defined hemorrhage. Fluid collections within the upper abdominal wall musculature just underneath the anterior ribs, 8.7 x 3.3 cm on the left series 12, image 30, 4.0 x 2.7 cm on the right series 12, image 31. There is no evidence of active extravasation within any of these hematomas. There is edema within the subcutaneous tissues of the left flank/abdominal wall as well as anterior to the lower abdominal wall musculature. Musculoskeletal: Unchanged from earlier today with L4 compression deformity. Left hip arthroplasty. IMPRESSION: 1. No evidence of active extravasation within any of the retroperitoneal or extraperitoneal fluid collections/presumed hematoma. 2. Multiple areas of presume extraperitoneal and retroperitoneal hematoma/hemorrhage, largest area in the left pelvis measuring 12.9 x 7 cm, likely contiguous with the left rectus abdominus musculature. Allowing for differences in caliper placement in technique, no significant change in size of these collections from earlier today. Possibility of infection is considered but felt less likely. 3. Cirrhosis. Indeterminate 13 mm low-density lesion in the left lobe of the liver measuring slightly higher than simple fluid density. Recommend further evaluation with MRI on a nonemergent basis after resolution of acute event. 4. Cholelithiasis without acute cholecystitis. 5. Aortic and branch atherosclerosis with areas of arterial stenosis up to moderate in degree, as described. 6. Additional chronic changes are stable from earlier today. Aortic Atherosclerosis (ICD10-I70.0). Electronically Signed   By: Keith Rake M.D.   On: 04/14/2022 16:41   CT CHEST ABDOMEN PELVIS WO CONTRAST  Result Date:  04/14/2022 CLINICAL DATA:  Generalized pain.  Sepsis.  Nausea. EXAM: CT CHEST, ABDOMEN AND PELVIS WITHOUT CONTRAST TECHNIQUE: Multidetector CT imaging of the chest, abdomen and pelvis was performed following the standard protocol without IV contrast. RADIATION DOSE REDUCTION: This exam was performed according to the departmental dose-optimization program which includes automated exposure control, adjustment of the mA and/or kV according to patient size and/or use of iterative reconstruction technique. COMPARISON:  Chest x-ray earlier 04/14/2022. Older x-rays. CT scan abdomen pelvis 2017 July FINDINGS: CT CHEST FINDINGS Cardiovascular: Heart is enlarged. Trace pericardial fluid. Prominent calcifications along the mitral valve. Postop chest with prosthetic aortic valve. The thoracic aorta has scattered calcified atherosclerotic plaque. Diameter of the ascending aorta at the level of the right pulmonary artery measures 4.3 by 4.0 cm. Mediastinum/Nodes: Small thyroid gland. No specific abnormal lymph node enlargement  present in the axillary region, hila or mediastinum on this noncontrast exam. Prominent subcarinal node is noted measuring 17 by 10 mm on series 2, image 30. Normal caliber thoracic esophagus. Lungs/Pleura: Diffuse breathing motion. Small right-sided pleural effusion identified with some adjacent parenchymal opacities. Associated pleural thickening. This effusion was seen in 2017 and with slightly larger that time. More thickening today of the pleura. Atelectasis versus infiltrate. There is some bandlike areas of opacity as well in the middle lobe. Scattered areas of interstitial septal thickening are identified. There are few patchy ground-glass areas as well in the right upper lobe towards the apex such as series 3, image 42, series 3, image 53. Additional areas scattered in the left upper lobe, middle lobe and superior segment of left lower lobe. This has a differential including atypical or viral  process. Recommend follow-up. Musculoskeletal: Osteopenia. Scattered degenerative changes noted along the spine. Of note the patient's arms were scanned at the patient's side. CT ABDOMEN PELVIS FINDINGS Hepatobiliary: Nodular contour to the liver. Please correlate for any known history of chronic liver disease. There are multiple stones in the nondilated gallbladder. Pancreas: Moderate pancreatic atrophy. No obvious mass. Appearance is similar to prior. Spleen: Spleen is grossly nonenlarged. Adrenals/Urinary Tract: Adrenal glands are grossly preserved. There is mild bilateral renal atrophy with some focal areas along the midportion left kidney laterally. Some calcifications noted along the hilum of the right kidney which very well could be vascular. No definite ureteral or renal stones otherwise. The bladder is somewhat decompressed with wall thickening but there is mass effect along the bladder from the adjacent fluid collections. Stomach/Bowel: Stomach is mildly distended with fluid. The small bowel is nondilated. Large bowel is nondilated prominent right-sided colonic stool. Again evaluation is limited by significant motion. Vascular/Lymphatic: Diffuse vascular calcifications are identified with areas of significant stenosis suggested along branch vessels including SMA, renal arteries. Normal caliber IVC. Reproductive: Scattered calcifications along the uterus. These could be vascular. No separate adnexal mass. Other: Significant motion noted limited evaluation. Extensive areas of presumed hematoma identified. These include the rectus abdominis muscles bilaterally, left-greater-than-right. The left hematoma extends along the course of the entirety of the rectus abdominis muscle, cephalocaudal length approaching 29 cm. Transverse dimension at the level of the pelvis approximate 7.4 by 4.8 cm. Again the right-sided areas are smaller and are along the lower chest and upper abdomen regions. There is also a large  extraperitoneal hematoma more on the left than on the right with mass effect along surrounding structures including the bladder. This extends along the margin of the iliacus muscle. Longitudinal length on coronal image 89 of series 5 measures a proximally 18.2 cm. Axial dimension maximally approaches 10.5 by 5.5 cm. Anasarca. Musculoskeletal: Streak artifact related to patient's left hip arthroplasty obscuring the surrounding tissues. Curvature and degenerative changes seen along the spine. Osteopenia. Multilevel stenosis identified along the lumbar spine. There is moderate compression of the superior endplate of L4 which was not seen in 2017 per the margins are somewhat sclerotic. Favor a subacute to chronic process. Please correlate with history. There is multilevel disc bulging. Degenerative changes seen along the pelvis. Critical Value/emergent results were called by telephone at the time of interpretation on 04/14/2022 at 1:59 pm to provider Neylan Koroma , who verbally acknowledged these results. IMPRESSION: 1. Large areas of presumed hematoma involving the rectus abdominis muscles bilaterally, left-greater-than-right. There is also large extraperitoneal hematoma along the margin of the iliopsoas muscle on the left. This extends along the margin  of the bladder with mass effect. 2. Small right-sided pleural effusion with adjacent parenchymal opacities. This effusion was seen in 2017 and with slightly larger that time. More thickening of the pleura today. Atelectasis versus infiltrate. 3. Patchy areas of ground-glass in both lungs. This has a differential including atypical or viral process. Recommend follow-up. 4. Cardiomegaly.  Postop chest. 5. Ascending aortic aneurysm measuring 4.3 x 4.0 cm. 6. Nodular contour to the liver. Please correlate for any known history of chronic liver disease. 7. Cholelithiasis. 8. Diffuse vascular calcifications with areas of significant stenosis along branch vessels including SMA,  renal arteries. 9. Moderate compression of the superior endplate of L4, not seen in 2017 per the margins are somewhat sclerotic. Favor a subacute to chronic process. Please correlate with history. 10. Aortic atherosclerosis. Aortic Atherosclerosis (ICD10-I70.0). Electronically Signed   By: Jill Side M.D.   On: 04/14/2022 14:04   DG Chest 2 View  Result Date: 04/14/2022 CLINICAL DATA:  Fall EXAM: CHEST - 2 VIEW COMPARISON:  Chest x-ray dated September 23, 2021 FINDINGS: Cardiac and mediastinal contours are unchanged post median sternotomy. Left-greater-than-right pleural effusions. No evidence of pneumothorax. Chronic appearing anterior second right rib fracture. IMPRESSION: Left-greater-than-right pleural effusions bibasilar atelectasis, similar to prior. Electronically Signed   By: Yetta Glassman M.D.   On: 04/14/2022 10:04    Orson Eva, DO  [facility]  If 7PM-7AM, please contact night-coverage [email or web address] Password [email or web address] 04/16/2022, 1:30 PM   LOS: 2 days

## 2022-04-16 NOTE — TOC Initial Note (Signed)
Transition of Care [facility]) - Initial/Assessment Note    Patient Details  Name: Mary [name] MRN: [id number] Date of Birth: [birth year]  [facility]) CM/SW Contact:    Iona Beard, Rose Phone Number: 04/16/2022, 12:04 PM  Clinical Narrative:                 Pt high risk for readmission. CSW noted per chart review that pt arrived from [place]. CSW started Fl2. TOC to follow.   Expected Discharge Plan: Assisted Living Barriers to Discharge: Continued Medical Work up   Patient Goals and CMS Choice Patient states their goals for this hospitalization and ongoing recovery are:: get better CMS Medicare.gov Compare Post Acute Care list provided to:: Patient Choice offered to / list presented to : Patient, Adult Children      Expected Discharge Plan and Services In-house Referral: Clinical Social Work Discharge Planning Services: CM Consult   Living arrangements for the past 2 months: [place]                                      Prior Living Arrangements/Services Living arrangements for the past 2 months: [place] Lives with:: Facility Resident Patient language and need for interpreter reviewed:: Yes Do you feel safe going back to the place where you live?: Yes      Need for Family Participation in Patient Care: Yes (Comment) Care giver support system in place?: Yes (comment) Current home services: DME Criminal Activity/Legal Involvement Pertinent to Current Situation/Hospitalization: No - Comment as needed  Activities of Daily Living Home Assistive Devices/Equipment: None ADL Screening (condition at time of admission) Patient's cognitive ability adequate to safely complete daily activities?: No Is the patient deaf or have difficulty hearing?: No Does the patient have difficulty seeing, even when wearing glasses/contacts?: No Does the patient have difficulty concentrating, remembering, or making decisions?:  Yes Patient able to express need for assistance with ADLs?: Yes Does the patient have difficulty dressing or bathing?: Yes Independently performs ADLs?: No Communication: Needs assistance Is this a change from baseline?: Pre-admission baseline Dressing (OT): Needs assistance Is this a change from baseline?: Pre-admission baseline Grooming: Needs assistance Is this a change from baseline?: Pre-admission baseline Feeding: Independent Bathing: Needs assistance Is this a change from baseline?: Pre-admission baseline Toileting: Needs assistance Is this a change from baseline?: Pre-admission baseline In/Out Bed: Needs assistance Is this a change from baseline?: Pre-admission baseline Walks in Home: Needs assistance Is this a change from baseline?: Pre-admission baseline Does the patient have difficulty walking or climbing stairs?: Yes Weakness of Legs: Both Weakness of Arms/Hands: Both  Permission Sought/Granted                  Emotional Assessment Appearance:: Appears stated age       Alcohol / Substance Use: Not Applicable Psych Involvement: No (comment)  Admission diagnosis:  Elevated INR [R79.1] Sepsis due to undetermined organism ([place]) [A41.9] Pneumonia due to infectious organism, unspecified laterality, unspecified part of lung [J18.9] Sepsis, due to unspecified organism, unspecified whether acute organ dysfunction present [facility]) [A41.9] Patient Active Problem List   Diagnosis Date Noted   Sepsis due to undetermined organism ([place]) 04/14/2022   CKD (chronic kidney disease) stage 4, GFR 15-29 ml/min ([place]) 04/14/2022   Acquired thrombophilia ([place]) 04/14/2022   Elevated INR 04/14/2022   Chronic diastolic CHF (congestive heart failure) ([place])  Pelvis fracture ([place]) 12/18/2020   Malnutrition of moderate degree 08/13/2020   Closed displaced fracture of left femoral neck ([place]) 08/13/2020   Hip fracture ([place]) 08/11/2020   Agitation 02/21/2020   Confusion 02/21/2020    Hyperkalemia 01/15/2020   AKI (acute kidney injury) on CKD 3b 01/15/2020   Anticoagulant long-term use 01/15/2020   Other secondary pulmonary hypertension ([place]) 10/28/2019   Acute on chronic combined systolic and diastolic CHF (congestive heart failure) ([place])    Anasarca 04/25/2019   Hypoalbuminemia 04/25/2019   Persistent atrial fibrillation ([place]) 04/25/2019   CKD (chronic kidney disease), stage III ([place]) 04/25/2019   Anemia due to chronic kidney disease 04/25/2019   Pressure injury of skin 04/24/2019   Fall 04/23/2019   S/P right knee arthroscopy 04/13/2015   OSA (obstructive sleep apnea) 01/01/2013   Type 2 diabetes mellitus with hyperlipidemia ([place]) 10/23/2012   H/O mechanical aortic valve replacement 06/30/2010   Hyperlipidemia 09/29/2008   Obesity 09/29/2008   Essential hypertension 09/29/2008   DEGENERATIVE JOINT DISEASE 09/29/2008   PCP:  Kathyrn Lass Pharmacy:   [facility] [address], [place] - [place] [address] [place] [address] Phone: [phone] Fax: [phone]     Social Determinants of Health (SDOH) Social History: SDOH Screenings   Food Insecurity: No Food Insecurity (04/15/2022)  Housing: Low Risk  (04/15/2022)  Transportation Needs: No Transportation Needs (04/15/2022)  Utilities: Not At Risk (04/15/2022)  Tobacco Use: Low Risk  (04/14/2022)   SDOH Interventions:     Readmission Risk Interventions    04/16/2022   12:03 PM 12/19/2020    2:33 PM  Readmission Risk Prevention Plan  Transportation Screening Complete Complete  Medication Review Press photographer) Complete Complete  HRI or Home Care Consult Complete Complete  SW Recovery Care/Counseling Consult Complete Complete  Palliative Care Screening Not Applicable Not Applicable  Skilled Nursing Facility Complete Complete

## 2022-04-17 DIAGNOSIS — Z515 Encounter for palliative care: Secondary | ICD-10-CM

## 2022-04-17 DIAGNOSIS — J189 Pneumonia, unspecified organism: Secondary | ICD-10-CM

## 2022-04-17 DIAGNOSIS — N179 Acute kidney failure, unspecified: Secondary | ICD-10-CM | POA: Diagnosis not present

## 2022-04-17 DIAGNOSIS — R58 Hemorrhage, not elsewhere classified: Secondary | ICD-10-CM

## 2022-04-17 DIAGNOSIS — D6869 Other thrombophilia: Secondary | ICD-10-CM

## 2022-04-17 DIAGNOSIS — D62 Acute posthemorrhagic anemia: Secondary | ICD-10-CM | POA: Diagnosis not present

## 2022-04-17 DIAGNOSIS — R791 Abnormal coagulation profile: Secondary | ICD-10-CM | POA: Diagnosis not present

## 2022-04-17 DIAGNOSIS — A419 Sepsis, unspecified organism: Secondary | ICD-10-CM | POA: Diagnosis not present

## 2022-04-17 DIAGNOSIS — K683 Retroperitoneal hematoma: Secondary | ICD-10-CM

## 2022-04-17 LAB — PREPARE FRESH FROZEN PLASMA
Unit division: 0
Unit division: 0
Unit division: 0

## 2022-04-17 LAB — PREPARE RBC (CROSSMATCH)

## 2022-04-17 LAB — CBC
HCT: 19.7 % — ABNORMAL LOW (ref 36.0–46.0)
Hemoglobin: 6.6 g/dL — CL (ref 12.0–15.0)
MCH: 32 pg (ref 26.0–34.0)
MCHC: 33.5 g/dL (ref 30.0–36.0)
MCV: 95.6 fL (ref 80.0–100.0)
Platelets: 101 10*3/uL — ABNORMAL LOW (ref 150–400)
RBC: 2.06 MIL/uL — ABNORMAL LOW (ref 3.87–5.11)
RDW: 17.6 % — ABNORMAL HIGH (ref 11.5–15.5)
WBC: 8.1 10*3/uL (ref 4.0–10.5)
nRBC: 0.2 % (ref 0.0–0.2)

## 2022-04-17 LAB — BPAM FFP
Blood Product Expiration Date: 202402012359
Blood Product Expiration Date: 202402012359
Blood Product Expiration Date: 202401290806
Blood Product Expiration Date: 202401290822
ISSUE DATE / TIME: 202401271308
ISSUE DATE / TIME: 202401271439
ISSUE DATE / TIME: 202401280850
ISSUE DATE / TIME: 202401281243
Unit Type and Rh: 6200
Unit Type and Rh: 6200
Unit Type and Rh: 6200
Unit Type and Rh: 6200

## 2022-04-17 LAB — TSH: TSH: 0.691 u[IU]/mL (ref 0.350–4.500)

## 2022-04-17 LAB — HEMOGLOBIN AND HEMATOCRIT, BLOOD
HCT: 20.8 % — ABNORMAL LOW (ref 36.0–46.0)
Hemoglobin: 6.9 g/dL — CL (ref 12.0–15.0)

## 2022-04-17 LAB — BASIC METABOLIC PANEL
Anion gap: 9 (ref 5–15)
BUN: 52 mg/dL — ABNORMAL HIGH (ref 8–23)
CO2: 23 mmol/L (ref 22–32)
Calcium: 7.4 mg/dL — ABNORMAL LOW (ref 8.9–10.3)
Chloride: 104 mmol/L (ref 98–111)
Creatinine, Ser: 2.06 mg/dL — ABNORMAL HIGH (ref 0.44–1.00)
GFR, Estimated: 23 mL/min — ABNORMAL LOW (ref >60)
Glucose, Bld: 129 mg/dL — ABNORMAL HIGH (ref 70–99)
Potassium: 3 mmol/L — ABNORMAL LOW (ref 3.5–5.1)
Sodium: 136 mmol/L (ref 135–145)

## 2022-04-17 LAB — T4, FREE: Free T4: 1.04 ng/dL (ref 0.61–1.12)

## 2022-04-17 LAB — FOLATE: Folate: 8.4 ng/mL (ref >5.9)

## 2022-04-17 LAB — PROTIME-INR
INR: 1.8 — ABNORMAL HIGH (ref 0.8–1.2)
Prothrombin Time: 21.1 seconds — ABNORMAL HIGH (ref 11.4–15.2)

## 2022-04-17 LAB — VITAMIN B12: Vitamin B-12: 1060 pg/mL — ABNORMAL HIGH (ref 180–914)

## 2022-04-17 LAB — OCCULT BLOOD X 1 CARD TO LAB, STOOL: Fecal Occult Bld: NEGATIVE

## 2022-04-17 MED ORDER — SODIUM CHLORIDE 0.9% IV SOLUTION: Freq: Once | INTRAVENOUS | Status: AC

## 2022-04-17 MED ORDER — SIMVASTATIN 20 MG PO TABS
40.0000 mg | ORAL_TABLET | Freq: Every day | ORAL | Status: DC
  Administered 2022-04-17 – 2022-04-27 (×11): 40 mg via ORAL
  Filled 2022-04-17 (×11): qty 2

## 2022-04-17 MED ORDER — POTASSIUM CHLORIDE CRYS ER 20 MEQ PO TBCR
40.0000 meq | EXTENDED_RELEASE_TABLET | Freq: Once | ORAL | Status: AC
  Administered 2022-04-17: 40 meq via ORAL
  Filled 2022-04-17: qty 2

## 2022-04-17 MED ORDER — PANTOPRAZOLE SODIUM 40 MG IV SOLR
40.0000 mg | Freq: Two times a day (BID) | INTRAVENOUS | Status: DC
  Administered 2022-04-17 – 2022-04-28 (×22): 40 mg via INTRAVENOUS
  Filled 2022-04-17 (×22): qty 10

## 2022-04-17 MED ORDER — POTASSIUM CHLORIDE 10 MEQ/100ML IV SOLN
10.0000 meq | INTRAVENOUS | Status: AC
  Administered 2022-04-17 (×2): 10 meq via INTRAVENOUS
  Filled 2022-04-17 (×2): qty 100

## 2022-04-17 MED ORDER — FUROSEMIDE 10 MG/ML IJ SOLN
20.0000 mg | Freq: Once | INTRAMUSCULAR | Status: AC
  Administered 2022-04-17: 20 mg via INTRAVENOUS
  Filled 2022-04-17: qty 2

## 2022-04-17 NOTE — Progress Notes (Signed)
CRITICAL VALUE STICKER  CRITICAL VALUE: Hb 6.9  RECEIVER (on-site recipient of call):Kathleen Lime , RN  DATE & TIME NOTIFIED: 7:30 AM  MESSENGER (representative from lab): Glennon Hamilton  MD NOTIFIED: Dr. Carles Collet  TIME OF NOTIFICATION:7:35 AM  RESPONSE: Awaiting

## 2022-04-17 NOTE — Progress Notes (Signed)
CRITICAL VALUE STICKER  CRITICAL VALUE: Hb 6.6  RECEIVER (on-site recipient of call): Jessica  DATE & TIME NOTIFIED:   MESSENGER (representative from lab):  MD NOTIFIED: DR. Josephine Cables  TIME OF NOTIFICATION:  RESPONSE:  repeat H and H  Repeated, result awaiting

## 2022-04-17 NOTE — Consult Note (Signed)
Consultation Note Date: 04/17/2022   Patient Name: Mary [name]  DOB: 1941-02-21  MRN: [id number]  Age / Sex: 82 y.o., female  PCP: Pcp, No Referring Physician: Orson Eva, MD  Reason for Consultation:  Palliative support in critical illness  HPI/Patient Profile: 82 y.o. female  with past medical history of dementia, HTN, HLD, CKD3, anxiety, mechanical aortic valve, stroke, dCHF admitted on 04/14/2022 with shortness of breath, progressing weakness and abdominal pain. CT abdomen revealed large hematoma by the bilateral rectus abdominis left greater than right, and large extraperitoneal hematoma by the iliopsoas muscle. She has acute blood loss anemia, today Hgb is 6.9- 6.3 on admission. Has received multiple units of FFP and PRBC's. Her INR was elevated on admission at greater than 10. Receiving vitamin K. Today it is 1.8- coumadin on hold.  She had bump in Cr-  2.34 yesterday, down to 2.06 today. Palliative medicine consulted for family support in patient with critical illness.     Primary Decision Maker NEXT OF KIN - spouse Mary [name]   Discussion: Chart reviewed including labs, progress notes, imaging from this and previous encounters.  On eval Mary [name] was awake and alert. She is from [facility]. She enjoys playing Bingo there.  Prior to admission she was able to walk, dress herself, toilet herself, feed herself. She describes herself as forgetful.   She feels bad today with ongoing abdominal pain. Denies nausea. She is able to tell me she is in the hospital for "bleeding". She understands she has a serious illness that could get worse. She is christian faith and shares that she is ready for heaven if she were to get worse and transition to end of life. Her current goals of care are to continue to treat what is treatable and hopes for discharge back to the ALF.   I attempted to call patient's spouse- Mary [name],  and granddaughter Mary [name]. Messages were left for both.   I spoke to patient's daughter- Mary [name]. Introduced palliative medicine. Answered her questions regarding patient's current illness and possible risks of complications including- continued bleeding, development of clot d/t being off coumadin with risk for PE, stroke, kidney and other organ damage. We discussed the serious of her illness and high risk for decompensation.  Mary [name] shares that Mary [name]'s dementia was mild- she had some personality changes with combativeness toward her spouse that lead to her admission to the ALF, but she had been doing very well there.     SUMMARY OF RECOMMENDATIONS -Patient is DNR per attending discussion with family -GOC are to continue full scope care for now, monitor for possible complications- hopeful for return to ALF -PMT will continue to follow and provide support and education to family    Code Status/Advance Care Planning: DNR   Prognosis:   Unable to determine  Discharge Planning: To Be Determined  Primary Diagnoses: Present on Admission:  Sepsis due to undetermined organism ([place])  Anemia due to chronic kidney disease  Chronic diastolic CHF (congestive heart failure) (H[facility])  Acute renal  failure superimposed on stage 4 chronic kidney disease ([place])   Review of Systems  Constitutional:  Positive for fatigue.  Gastrointestinal:  Positive for abdominal pain.    Physical Exam Vitals and nursing note reviewed.  Cardiovascular:     Rate and Rhythm: Tachycardia present.  Pulmonary:     Comments: tachypneic Skin:    Coloration: Skin is pale.  Neurological:     Mental Status: She is alert and oriented to person, place, and time.     Vital Signs: BP (!) 140/62   Pulse (!) 102   Temp 98 F (36.7 C) (Oral)   Resp (!) 32   Ht 5\' 7"  (1.702 m)   Wt 66.5 kg   SpO2 98%   BMI 22.96 kg/m  Pain Scale: PAINAD POSS *See Group Information*: S-Acceptable,Sleep, easy to arouse Pain Score:  Asleep   SpO2: SpO2: 98 % O2 Device:SpO2: 98 % O2 Flow Rate: .O2 Flow Rate (L/min): 2 L/min  IO: Intake/output summary:  Intake/Output Summary (Last 24 hours) at 04/17/2022 1337 Last data filed at 04/17/2022 1133 Gross per 24 hour  Intake 1152.54 ml  Output 1550 ml  Net -397.46 ml    LBM: Last BM Date : 04/17/22 Baseline Weight: Weight: 63.7 kg Most recent weight: Weight: 66.5 kg       Thank you for this consult. Palliative medicine will continue to follow and assist as needed.  Time Total: 90 minutes Greater than 50%  of this time was spent counseling and coordinating care related to the above assessment and plan.  Signed by: Mariana Kaufman, AGNP-C Palliative Medicine    Please contact Palliative Medicine Team phone at [phone] for questions and concerns.  For individual provider: See Shea Evans

## 2022-04-17 NOTE — Progress Notes (Signed)
PROGRESS NOTE  Mary [name] LNL:[id number] DOB: 1941-01-16 DOA: 04/14/2022 PCP: Pcp, No  Brief History:  82 year old female with a history of dementia, hypertension, hyperlipidemia, CKD stage III, anxiety, mechanical aortic valve, stroke, diastolic CHF, and anxiety presenting from [facility] with generalized weakness for 3 to 4 days.  She has had associated decreased oral intake and nausea.  The patient is a difficult historian.  The patient's spouse is at the bedside.  He is also a difficult historian.  Nevertheless, it appears that the patient was treated for pneumonia.  Review of the [facility] from [place] shows the patient was started on levofloxacin on 04/08/2022 for 5-day course.  Even with the antibiotics, the patient continued to have progressive generalized weakness.  She denies any headache, neck pain, chest pain, hemoptysis, vomiting, diarrhea.  She does complain of abdominal pain.  She states that the abdominal pain has been present for 3 to 4 days.  There is no hematochezia or melena.  She has some dyspnea with exertion. In the ED, the patient was afebrile hemodynamically stable with oxygen saturation 99% on room air.  WBC 10.8, hemoglobin 9.3, platelets 302,000.  Sodium 135, potassium 4.4, bicarbonate 24, serum creatinine 1.77.  The patient was started on IV ceftriaxone and azithromycin and IV fluids.  She was admitted for further evaluation and treatment.   Assessment/Plan:  SIRS -concerned about intra-abdominal source of infection initially -CT abd--- Large areas of presumed hematoma involving the rectus abdominis muscles bilaterally, left-greater-than-right. There is also large extraperitoneal hematoma along the margin of the iliopsoas muscle on the left -presented with leukocytosis, tachycardia, elevated lactate -follow blood culture--neg to date -UA neg for pyuria -Lactic acid peaked 2.2 -Continue IV fluids -Started IV Zosyn pending culture data -Chest x-ray without  focal consolidation -sepsis ruled out   Abdominal pain/extraperitoneal/retroperitoneal bleed/ Acute Blood Loss Anemia -CT abdomen and pelvis--- Large areas of presumed hematoma involving the rectus abdominismuscles bilaterally, left-greater-than-right. There is also largeextraperitoneal hematoma along the margin of the iliopsoas muscle on the left -transfused 8 units PRBC total for the admission (2 ordered 1/29) -fentanyl prn pain -Hgb continues to drop about a gram daily   Supratherapeutic INR -Likely secondary to the patient's recent use of levofloxacin and decreased oral intake in the setting of warfarin use -Vitamin K 15 mg total given -8 units FFP given  total (2 ordered 1/29) -Daily INR   Status post mechanical aortic valve replacement -Continue anticoagulation with INR 2.5-3.5 -Holding warfarin temporarily secondary to supratherapeutic INR   Aspiration pneumonitis -initially started on zosyn -transition to amox/clav   Essential hypertension -Holding antihypertensives secondary to soft blood pressures intially   Dementia without behavioral disturbance -At risk for hospital delirium   Mixed hyperlipidemia -Restart simvastatin when able to tolerate p.o.   Anxiety/depression -Restart buspirone   Acute on chronic renal failure--CKD stage IV -Baseline creatinine 1.7 -due to hemodynamic changes, IV contrast -lasix IV x 1 as pt has signs of fluid overload -Monitor daily BMP   Thrombocytopenia -due to acute medical illness -serial CBC   Hypokalemia -replete -mag 2.1   Goals of Care -confirmed with patient, daughter, spouse>>DNR       Family Communication:   spouse updated 1/29   Consultants:  none   Code Status:  DNR   DVT Prophylaxis:  SCDs     Procedures: As Listed in Progress Note Above   Antibiotics: Zosyn 1/26>>1/29 Amox/clav 1/29>> Azithro 1/26>>1/28  Subjective: Pt complains of abdominal pain.  Denies f/c, cp, sob,  n/v/d  Objective: Vitals:   04/17/22 0500 04/17/22 0600 04/17/22 0700 04/17/22 0800  BP: (!) 127/54 (!) 135/53 (!) 130/52 (!) 141/52  Pulse: 100 100 95 100  Resp: (!) 24 (!) 23 (!) 22 (!) 22  Temp:      TempSrc:      SpO2: 97% 98% 98% 98%  Weight: 66.5 kg     Height:        Intake/Output Summary (Last 24 hours) at 04/17/2022 1017 Last data filed at 04/17/2022 6578 Gross per 24 hour  Intake 2549.19 ml  Output 1550 ml  Net 999.19 ml   Weight change:  Exam:  General:  Pt is alert, follows commands appropriately, not in acute distress HEENT: No icterus, No thrush, No neck mass, [place]/AT Cardiovascular: RRR, S1/S2, no rubs, no gallops Respiratory: bilateral crackles. No wheeze Abdomen: Soft/+BS, non tender, non distended, no guarding Extremities: 1 + LE edema, No lymphangitis, No petechiae, No rashes, no synovitis   Data Reviewed: I have personally reviewed following labs and imaging studies Basic Metabolic Panel: Recent Labs  Lab 04/14/22 1000 04/15/22 0455 04/16/22 0520 04/17/22 0416  NA 135 134* 133* 136  K 4.4 4.1 3.4* 3.0*  CL 99 102 102 104  CO2 24 21* 21* 23  GLUCOSE 125* 146* 164* 129*  BUN 24* 34* 49* 52*  CREATININE 1.77* 1.93* 2.34* 2.06*  CALCIUM 8.8* 7.0* 6.8* 7.4*  MG  --   --  2.1  --    Liver Function Tests: Recent Labs  Lab 04/14/22 1000 04/15/22 0455  AST 29 42*  ALT 13 18  ALKPHOS 89 59  BILITOT 1.7* 2.0*  PROT 7.5 5.7*  ALBUMIN 2.9* 2.4*   No results for input(s): "LIPASE", "AMYLASE" in the last 168 hours. No results for input(s): "AMMONIA" in the last 168 hours. Coagulation Profile: Recent Labs  Lab 04/14/22 1124 04/15/22 0455 04/16/22 0520 04/17/22 0416  INR >10.0* 4.7* 2.3* 1.8*   CBC: Recent Labs  Lab 04/14/22 1000 04/14/22 1514 04/15/22 0455 04/15/22 1738 04/16/22 0520 04/16/22 1122 04/17/22 0416 04/17/22 0701  WBC 11.8*  --  12.4*  --  13.7*  --  8.1  --   NEUTROABS 9.8*  --   --   --   --   --   --   --   HGB  9.3*   < > 7.5* 5.9* 7.8* 7.6* 6.6* 6.9*  HCT 29.6*   < > 22.6* 17.6* 22.7* 22.6* 19.7* 20.8*  MCV 101.0*  --  95.0  --  92.3  --  95.6  --   PLT 302  --  157  --  121*  --  101*  --    < > = values in this interval not displayed.   Cardiac Enzymes: No results for input(s): "CKTOTAL", "CKMB", "CKMBINDEX", "TROPONINI" in the last 168 hours. BNP: Invalid input(s): "POCBNP" CBG: No results for input(s): "GLUCAP" in the last 168 hours. HbA1C: No results for input(s): "HGBA1C" in the last 72 hours. Urine analysis:    Component Value Date/Time   COLORURINE YELLOW 04/14/2022 2200   APPEARANCEUR CLEAR 04/14/2022 2200   LABSPEC >1.046 (H) 04/14/2022 2200   PHURINE 5.0 04/14/2022 2200   GLUCOSEU NEGATIVE 04/14/2022 2200   HGBUR NEGATIVE 04/14/2022 2200   BILIRUBINUR NEGATIVE 04/14/2022 2200   KETONESUR NEGATIVE 04/14/2022 2200   PROTEINUR NEGATIVE 04/14/2022 2200   UROBILINOGEN 2.0 (H) 11/01/2007 1518   NITRITE  NEGATIVE 04/14/2022 2200   LEUKOCYTESUR NEGATIVE 04/14/2022 2200   Sepsis Labs: @LABRCNTIP (procalcitonin:4,lacticidven:4) ) Recent Results (from the past 240 hour(s))  Blood culture (routine x 2)     Status: None (Preliminary result)   Collection Time: 04/14/22 10:30 AM   Specimen: BLOOD  Result Value Ref Range Status   Specimen Description BLOOD LEFT ASSIST CONTROL  Final   Special Requests   Final    BOTTLES DRAWN AEROBIC AND ANAEROBIC Blood Culture adequate volume   Culture   Final    NO GROWTH 3 DAYS Performed at [facility], [address], [place], [place] [address]    Report Status PENDING  Incomplete  Blood culture (routine x 2)     Status: None (Preliminary result)   Collection Time: 04/14/22 10:42 AM   Specimen: BLOOD  Result Value Ref Range Status   Specimen Description BLOOD RIGHT ASSIST CONTROL  Final   Special Requests   Final    BOTTLES DRAWN AEROBIC AND ANAEROBIC Blood Culture adequate volume   Culture   Final    NO GROWTH 3 DAYS Performed at [facility], [address], [place], [place] [address]    Report Status PENDING  Incomplete  Resp panel by RT-PCR (RSV, Flu A&B, Covid) Anterior Nasal Swab     Status: None   Collection Time: 04/14/22  6:00 PM   Specimen: Anterior Nasal Swab  Result Value Ref Range Status   SARS Coronavirus 2 by RT PCR NEGATIVE NEGATIVE Final    Comment: (NOTE) SARS-CoV-2 target nucleic acids are NOT DETECTED.  The SARS-CoV-2 RNA is generally detectable in upper respiratory specimens during the acute phase of infection. The lowest concentration of SARS-CoV-2 viral copies this assay can detect is 138 copies/mL. A negative result does not preclude SARS-Cov-2 infection and should not be used as the sole basis for treatment or other patient management decisions. A negative result may occur with  improper specimen collection/handling, submission of specimen other than nasopharyngeal swab, presence of viral mutation(s) within the areas targeted by this assay, and inadequate number of viral copies(<138 copies/mL). A negative result must be combined with clinical observations, patient history, and epidemiological information. The expected result is Negative.  Fact Sheet for Patients:  EntrepreneurPulse.com.au  Fact Sheet for Healthcare Providers:  IncredibleEmployment.be  This test is no t yet approved or cleared by the [place] FDA and  has been authorized for detection and/or diagnosis of SARS-CoV-2 by FDA under an Emergency Use Authorization (EUA). This EUA will remain  in effect (meaning this test can be used) for the duration of the COVID-19 declaration under Section 564(b)(1) of the Act, 21 U.S.C.section 360bbb-3(b)(1), unless the authorization is terminated  or revoked sooner.       Influenza A by PCR NEGATIVE NEGATIVE Final   Influenza B by PCR NEGATIVE NEGATIVE Final    Comment: (NOTE) The Xpert Xpress SARS-CoV-2/FLU/RSV plus assay is intended as an  aid in the diagnosis of influenza from Nasopharyngeal swab specimens and should not be used as a sole basis for treatment. Nasal washings and aspirates are unacceptable for Xpert Xpress SARS-CoV-2/FLU/RSV testing.  Fact Sheet for Patients: EntrepreneurPulse.com.au  Fact Sheet for Healthcare Providers: IncredibleEmployment.be  This test is not yet approved or cleared by the [place] FDA and has been authorized for detection and/or diagnosis of SARS-CoV-2 by FDA under an Emergency Use Authorization (EUA). This EUA will remain in effect (meaning this test can be used) for the duration of the COVID-19 declaration under Section  564(b)(1) of the Act, 21 U.S.C. section 360bbb-3(b)(1), unless the authorization is terminated or revoked.     Resp Syncytial Virus by PCR NEGATIVE NEGATIVE Final    Comment: (NOTE) Fact Sheet for Patients: EntrepreneurPulse.com.au  Fact Sheet for Healthcare Providers: IncredibleEmployment.be  This test is not yet approved or cleared by the [place] FDA and has been authorized for detection and/or diagnosis of SARS-CoV-2 by FDA under an Emergency Use Authorization (EUA). This EUA will remain in effect (meaning this test can be used) for the duration of the COVID-19 declaration under Section 564(b)(1) of the Act, 21 U.S.C. section 360bbb-3(b)(1), unless the authorization is terminated or revoked.  Performed at [facility], [address] [place], [place] [address]   MRSA Next Gen by PCR, Nasal     Status: None   Collection Time: 04/14/22  6:53 PM   Specimen: Nasal Mucosa; Nasal Swab  Result Value Ref Range Status   MRSA by PCR Next Gen NOT DETECTED NOT DETECTED Final    Comment: (NOTE) The GeneXpert MRSA Assay (FDA approved for NASAL specimens only), is one component of a comprehensive MRSA colonization surveillance program. It is not intended to diagnose MRSA infection  nor to guide or monitor treatment for MRSA infections. Test performance is not FDA approved in patients less than 57 years old. Performed at [facility], [address], [place], [place] [address]      Scheduled Meds:  sodium chloride   Intravenous Once   sodium chloride   Intravenous Once   amoxicillin-clavulanate  1 tablet Oral BID   buPROPion  150 mg Oral q AM   busPIRone  10 mg Oral BID   Chlorhexidine Gluconate Cloth  6 each Topical Daily   pantoprazole  40 mg Oral QAC breakfast   sodium bicarbonate  650 mg Oral TID   sodium chloride flush  10-40 mL Intracatheter Q12H   Continuous Infusions:  azithromycin Stopped (04/16/22 1229)   potassium chloride 10 mEq (04/17/22 1003)    Procedures/Studies: [place] EKG SITE RITE  Result Date: 04/15/2022 If Site Rite image not attached, placement could not be confirmed due to current cardiac rhythm.  CT Angio Abd/Pel w/ and/or w/o  Result Date: 04/14/2022 CLINICAL DATA:  Retroperitoneal bleed suspected, extraperitoneal bleed. EXAM: CTA ABDOMEN AND PELVIS WITHOUT AND WITH CONTRAST TECHNIQUE: Multidetector CT imaging of the abdomen and pelvis was performed using the standard protocol during bolus administration of intravenous contrast. Multiplanar reconstructed images and MIPs were obtained and reviewed to evaluate the vascular anatomy. RADIATION DOSE REDUCTION: This exam was performed according to the departmental dose-optimization program which includes automated exposure control, adjustment of the mA and/or kV according to patient size and/or use of iterative reconstruction technique. CONTRAST:  70mL OMNIPAQUE IOHEXOL 350 MG/ML SOLN COMPARISON:  Noncontrast CT earlier today. FINDINGS: VASCULAR Aorta: Moderate atherosclerosis. No aneurysm, dissection or acute findings. Celiac: Calcified plaque at the origin causes mild stenosis. Distal branch vessels are patent. No aneurysm, dissection or acute findings. SMA: Mild diffuse atheromatous plaque with  mild areas of stenosis. No aneurysm or acute findings. Renals: Mild plaque in the proximal renal arteries causing varying degrees of stenosis up to moderate. No aneurysm or acute findings. IMA: Patent. Inflow: Mild atherosclerosis without acute findings. Proximal Outflow: Bilateral common femoral and visualized portions of the superficial and profunda femoral arteries are patent without evidence of aneurysm, dissection, vasculitis or significant stenosis. Retroperitoneal and extraperitoneal hematomas. There is no active arterial extravasation within any of the retroperitoneal or extraperitoneal bleed. Veins:  Venous phase imaging demonstrates patency of the iliac veins and IVC. Portal, splenic and mesenteric veins are patent. No areas of active extravasation or seen on venous phase. Review of the MIP images confirms the above findings. NON-VASCULAR Lower chest: Right pleural effusion with pleural enhancement, unchanged from earlier today. Hepatobiliary: Nodular hepatic contours typical of cirrhosis. Water density there is a 13 mm low-density lesion in the left lobe of the liver series 12, image 21, measuring slightly higher than simple fluid density, nonspecific. Multiple gallstones without evidence of gallbladder inflammation. Pancreas: Parenchymal atrophy. No ductal dilatation or inflammation. Spleen: No splenomegaly. Adrenals/Urinary Tract: No adrenal nodule or hemorrhage. Bilateral renal parenchymal atrophy with multifocal areas of parenchymal scarring. Parenchymal calcifications in the upper pole the right kidney. No hydronephrosis. Displaced to the right due to left pelvic fluid collections. The bladder appears thick walled which is nonspecific. Stomach/Bowel: No bowel obstruction or inflammation. There is mild colonic distension with stool proximally and air distally. The appendix is not seen. Lymphatic: No gross adenopathy. Reproductive: Uterine vascular calcifications again seen. Other: Again seen multiple  areas of presume extraperitoneal and retroperitoneal hematoma/hemorrhage. Largest area is in the left pelvis measuring approximately 12.9 x 7 cm with hematocrit level, measured on series 12, image 78. This may be contiguous with the left rectus abdominus musculature. More superiorly is an area of presumed rectus hematoma measuring 6.1 x 5.1 cm, measured on series 12, image 67. There is stranding in the left retroperitoneum tracking anterior to the iliopsoas muscle that is primarily ill-defined. Stranding within the right extraperitoneal M with ill-defined hemorrhage. Fluid collections within the upper abdominal wall musculature just underneath the anterior ribs, 8.7 x 3.3 cm on the left series 12, image 30, 4.0 x 2.7 cm on the right series 12, image 31. There is no evidence of active extravasation within any of these hematomas. There is edema within the subcutaneous tissues of the left flank/abdominal wall as well as anterior to the lower abdominal wall musculature. Musculoskeletal: Unchanged from earlier today with L4 compression deformity. Left hip arthroplasty. IMPRESSION: 1. No evidence of active extravasation within any of the retroperitoneal or extraperitoneal fluid collections/presumed hematoma. 2. Multiple areas of presume extraperitoneal and retroperitoneal hematoma/hemorrhage, largest area in the left pelvis measuring 12.9 x 7 cm, likely contiguous with the left rectus abdominus musculature. Allowing for differences in caliper placement in technique, no significant change in size of these collections from earlier today. Possibility of infection is considered but felt less likely. 3. Cirrhosis. Indeterminate 13 mm low-density lesion in the left lobe of the liver measuring slightly higher than simple fluid density. Recommend further evaluation with MRI on a nonemergent basis after resolution of acute event. 4. Cholelithiasis without acute cholecystitis. 5. Aortic and branch atherosclerosis with areas of  arterial stenosis up to moderate in degree, as described. 6. Additional chronic changes are stable from earlier today. Aortic Atherosclerosis (ICD10-I70.0). Electronically Signed   By: Keith Rake M.D.   On: 04/14/2022 16:41   CT CHEST ABDOMEN PELVIS WO CONTRAST  Result Date: 04/14/2022 CLINICAL DATA:  Generalized pain.  Sepsis.  Nausea. EXAM: CT CHEST, ABDOMEN AND PELVIS WITHOUT CONTRAST TECHNIQUE: Multidetector CT imaging of the chest, abdomen and pelvis was performed following the standard protocol without IV contrast. RADIATION DOSE REDUCTION: This exam was performed according to the departmental dose-optimization program which includes automated exposure control, adjustment of the mA and/or kV according to patient size and/or use of iterative reconstruction technique. COMPARISON:  Chest x-ray earlier 04/14/2022. Older x-rays. CT  scan abdomen pelvis 2017 July FINDINGS: CT CHEST FINDINGS Cardiovascular: Heart is enlarged. Trace pericardial fluid. Prominent calcifications along the mitral valve. Postop chest with prosthetic aortic valve. The thoracic aorta has scattered calcified atherosclerotic plaque. Diameter of the ascending aorta at the level of the right pulmonary artery measures 4.3 by 4.0 cm. Mediastinum/Nodes: Small thyroid gland. No specific abnormal lymph node enlargement present in the axillary region, hila or mediastinum on this noncontrast exam. Prominent subcarinal node is noted measuring 17 by 10 mm on series 2, image 30. Normal caliber thoracic esophagus. Lungs/Pleura: Diffuse breathing motion. Small right-sided pleural effusion identified with some adjacent parenchymal opacities. Associated pleural thickening. This effusion was seen in 2017 and with slightly larger that time. More thickening today of the pleura. Atelectasis versus infiltrate. There is some bandlike areas of opacity as well in the middle lobe. Scattered areas of interstitial septal thickening are identified. There are few  patchy ground-glass areas as well in the right upper lobe towards the apex such as series 3, image 42, series 3, image 53. Additional areas scattered in the left upper lobe, middle lobe and superior segment of left lower lobe. This has a differential including atypical or viral process. Recommend follow-up. Musculoskeletal: Osteopenia. Scattered degenerative changes noted along the spine. Of note the patient's arms were scanned at the patient's side. CT ABDOMEN PELVIS FINDINGS Hepatobiliary: Nodular contour to the liver. Please correlate for any known history of chronic liver disease. There are multiple stones in the nondilated gallbladder. Pancreas: Moderate pancreatic atrophy. No obvious mass. Appearance is similar to prior. Spleen: Spleen is grossly nonenlarged. Adrenals/Urinary Tract: Adrenal glands are grossly preserved. There is mild bilateral renal atrophy with some focal areas along the midportion left kidney laterally. Some calcifications noted along the hilum of the right kidney which very well could be vascular. No definite ureteral or renal stones otherwise. The bladder is somewhat decompressed with wall thickening but there is mass effect along the bladder from the adjacent fluid collections. Stomach/Bowel: Stomach is mildly distended with fluid. The small bowel is nondilated. Large bowel is nondilated prominent right-sided colonic stool. Again evaluation is limited by significant motion. Vascular/Lymphatic: Diffuse vascular calcifications are identified with areas of significant stenosis suggested along branch vessels including SMA, renal arteries. Normal caliber IVC. Reproductive: Scattered calcifications along the uterus. These could be vascular. No separate adnexal mass. Other: Significant motion noted limited evaluation. Extensive areas of presumed hematoma identified. These include the rectus abdominis muscles bilaterally, left-greater-than-right. The left hematoma extends along the course of the  entirety of the rectus abdominis muscle, cephalocaudal length approaching 29 cm. Transverse dimension at the level of the pelvis approximate 7.4 by 4.8 cm. Again the right-sided areas are smaller and are along the lower chest and upper abdomen regions. There is also a large extraperitoneal hematoma more on the left than on the right with mass effect along surrounding structures including the bladder. This extends along the margin of the iliacus muscle. Longitudinal length on coronal image 89 of series 5 measures a proximally 18.2 cm. Axial dimension maximally approaches 10.5 by 5.5 cm. Anasarca. Musculoskeletal: Streak artifact related to patient's left hip arthroplasty obscuring the surrounding tissues. Curvature and degenerative changes seen along the spine. Osteopenia. Multilevel stenosis identified along the lumbar spine. There is moderate compression of the superior endplate of L4 which was not seen in 2017 per the margins are somewhat sclerotic. Favor a subacute to chronic process. Please correlate with history. There is multilevel disc bulging. Degenerative changes seen along  the pelvis. Critical Value/emergent results were called by telephone at the time of interpretation on 04/14/2022 at 1:59 pm to provider Kemia Wendel , who verbally acknowledged these results. IMPRESSION: 1. Large areas of presumed hematoma involving the rectus abdominis muscles bilaterally, left-greater-than-right. There is also large extraperitoneal hematoma along the margin of the iliopsoas muscle on the left. This extends along the margin of the bladder with mass effect. 2. Small right-sided pleural effusion with adjacent parenchymal opacities. This effusion was seen in 2017 and with slightly larger that time. More thickening of the pleura today. Atelectasis versus infiltrate. 3. Patchy areas of ground-glass in both lungs. This has a differential including atypical or viral process. Recommend follow-up. 4. Cardiomegaly.  Postop chest. 5.  Ascending aortic aneurysm measuring 4.3 x 4.0 cm. 6. Nodular contour to the liver. Please correlate for any known history of chronic liver disease. 7. Cholelithiasis. 8. Diffuse vascular calcifications with areas of significant stenosis along branch vessels including SMA, renal arteries. 9. Moderate compression of the superior endplate of L4, not seen in 2017 per the margins are somewhat sclerotic. Favor a subacute to chronic process. Please correlate with history. 10. Aortic atherosclerosis. Aortic Atherosclerosis (ICD10-I70.0). Electronically Signed   By: Jill Side M.D.   On: 04/14/2022 14:04   DG Chest 2 View  Result Date: 04/14/2022 CLINICAL DATA:  Fall EXAM: CHEST - 2 VIEW COMPARISON:  Chest x-ray dated September 23, 2021 FINDINGS: Cardiac and mediastinal contours are unchanged post median sternotomy. Left-greater-than-right pleural effusions. No evidence of pneumothorax. Chronic appearing anterior second right rib fracture. IMPRESSION: Left-greater-than-right pleural effusions bibasilar atelectasis, similar to prior. Electronically Signed   By: Yetta Glassman M.D.   On: 04/14/2022 10:04    Orson Eva, DO  [facility]  If 7PM-7AM, please contact [email or web address] [email or web address] Password [email or web address] 04/17/2022, 10:17 AM   LOS: 3 days

## 2022-04-18 DIAGNOSIS — N179 Acute kidney failure, unspecified: Secondary | ICD-10-CM | POA: Diagnosis not present

## 2022-04-18 DIAGNOSIS — A419 Sepsis, unspecified organism: Secondary | ICD-10-CM | POA: Diagnosis not present

## 2022-04-18 DIAGNOSIS — Z515 Encounter for palliative care: Secondary | ICD-10-CM | POA: Diagnosis not present

## 2022-04-18 DIAGNOSIS — R58 Hemorrhage, not elsewhere classified: Secondary | ICD-10-CM | POA: Diagnosis not present

## 2022-04-18 DIAGNOSIS — Z7189 Other specified counseling: Secondary | ICD-10-CM | POA: Diagnosis not present

## 2022-04-18 DIAGNOSIS — D62 Acute posthemorrhagic anemia: Secondary | ICD-10-CM | POA: Diagnosis not present

## 2022-04-18 LAB — BPAM FFP
Blood Product Expiration Date: 202402032359
Blood Product Expiration Date: 202402032359
ISSUE DATE / TIME: 202401291817
ISSUE DATE / TIME: 202401292116
Unit Type and Rh: 600
Unit Type and Rh: 6200

## 2022-04-18 LAB — PREPARE FRESH FROZEN PLASMA
Unit division: 0
Unit division: 0

## 2022-04-18 LAB — BPAM RBC
Blood Product Expiration Date: 202402112359
Blood Product Expiration Date: 202402182359
Blood Product Expiration Date: 202402212359
Blood Product Expiration Date: 202402212359
Blood Product Expiration Date: 202402212359
Blood Product Expiration Date: 202402262359
ISSUE DATE / TIME: 202401262211
ISSUE DATE / TIME: 202401270026
ISSUE DATE / TIME: 202401271851
ISSUE DATE / TIME: 202401272203
ISSUE DATE / TIME: 202401291112
ISSUE DATE / TIME: 202401291458
Unit Type and Rh: 6200
Unit Type and Rh: 6200
Unit Type and Rh: 6200
Unit Type and Rh: 6200
Unit Type and Rh: 6200
Unit Type and Rh: 6200

## 2022-04-18 LAB — BASIC METABOLIC PANEL
Anion gap: 10 (ref 5–15)
BUN: 46 mg/dL — ABNORMAL HIGH (ref 8–23)
CO2: 21 mmol/L — ABNORMAL LOW (ref 22–32)
Calcium: 8.2 mg/dL — ABNORMAL LOW (ref 8.9–10.3)
Chloride: 107 mmol/L (ref 98–111)
Creatinine, Ser: 1.8 mg/dL — ABNORMAL HIGH (ref 0.44–1.00)
GFR, Estimated: 27 mL/min — ABNORMAL LOW (ref >60)
Glucose, Bld: 144 mg/dL — ABNORMAL HIGH (ref 70–99)
Potassium: 3.5 mmol/L (ref 3.5–5.1)
Sodium: 138 mmol/L (ref 135–145)

## 2022-04-18 LAB — TYPE AND SCREEN
ABO/RH(D): A POS
ABO/RH(D): A POS
Antibody Screen: NEGATIVE
Antibody Screen: NEGATIVE
Unit division: 0
Unit division: 0
Unit division: 0
Unit division: 0
Unit division: 0
Unit division: 0

## 2022-04-18 LAB — CBC
HCT: 29.6 % — ABNORMAL LOW (ref 36.0–46.0)
Hemoglobin: 9.9 g/dL — ABNORMAL LOW (ref 12.0–15.0)
MCH: 31.7 pg (ref 26.0–34.0)
MCHC: 33.4 g/dL (ref 30.0–36.0)
MCV: 94.9 fL (ref 80.0–100.0)
Platelets: 119 10*3/uL — ABNORMAL LOW (ref 150–400)
RBC: 3.12 MIL/uL — ABNORMAL LOW (ref 3.87–5.11)
RDW: 17.6 % — ABNORMAL HIGH (ref 11.5–15.5)
WBC: 10.7 10*3/uL — ABNORMAL HIGH (ref 4.0–10.5)
nRBC: 1.5 % — ABNORMAL HIGH (ref 0.0–0.2)

## 2022-04-18 LAB — C DIFFICILE (CDIFF) QUICK SCRN (NO PCR REFLEX)
C Diff antigen: NEGATIVE
C Diff interpretation: NOT DETECTED
C Diff toxin: NEGATIVE

## 2022-04-18 LAB — PROTIME-INR
INR: 1.7 — ABNORMAL HIGH (ref 0.8–1.2)
Prothrombin Time: 19.6 seconds — ABNORMAL HIGH (ref 11.4–15.2)

## 2022-04-18 MED ORDER — PHYTONADIONE 5 MG PO TABS
5.0000 mg | ORAL_TABLET | Freq: Once | ORAL | Status: AC
  Administered 2022-04-18: 5 mg via ORAL
  Filled 2022-04-18: qty 1

## 2022-04-18 MED ORDER — FUROSEMIDE 10 MG/ML IJ SOLN
20.0000 mg | Freq: Once | INTRAMUSCULAR | Status: AC
  Administered 2022-04-18: 20 mg via INTRAVENOUS
  Filled 2022-04-18: qty 2

## 2022-04-18 MED ORDER — SODIUM CHLORIDE 0.9% IV SOLUTION: Freq: Once | INTRAVENOUS | Status: AC

## 2022-04-18 NOTE — Progress Notes (Signed)
PROGRESS NOTE  Mary [name] JIR:[id number] DOB: 12-Dec-1940 DOA: 04/14/2022 PCP: Pcp, No  Brief History:  82 year old female with a history of dementia, hypertension, hyperlipidemia, CKD stage III, anxiety, mechanical aortic valve, stroke, diastolic CHF, and anxiety presenting from [facility] with generalized weakness for 3 to 4 days.  She has had associated decreased oral intake and nausea.  The patient is a difficult historian.  The patient's spouse is at the bedside.  He is also a difficult historian.  Nevertheless, it appears that the patient was treated for pneumonia.  Review of the [facility] from [place] shows the patient was started on levofloxacin on 04/08/2022 for 5-day course.  Even with the antibiotics, the patient continued to have progressive generalized weakness.  She denies any headache, neck pain, chest pain, hemoptysis, vomiting, diarrhea.  She does complain of abdominal pain.  She states that the abdominal pain has been present for 3 to 4 days.  There is no hematochezia or melena.  She has some dyspnea with exertion. In the ED, the patient was afebrile hemodynamically stable with oxygen saturation 99% on room air.  WBC 10.8, hemoglobin 9.3, platelets 302,000.  Sodium 135, potassium 4.4, bicarbonate 24, serum creatinine 1.77.  The patient was started on IV ceftriaxone and azithromycin and IV fluids.  She was admitted for further evaluation and treatment.   Assessment/Plan: SIRS -concerned about intra-abdominal source of infection initially -CT abd--- Large areas of presumed hematoma involving the rectus abdominis muscles bilaterally, left-greater-than-right. There is also large extraperitoneal hematoma along the margin of the iliopsoas muscle on the left -presented with leukocytosis, tachycardia, elevated lactate -follow blood culture--neg to date -UA neg for pyuria -Lactic acid peaked 2.2 -Continue IV fluids>>saline locked -Started IV Zosyn pending culture data>>amox/clav  1/30 -Chest x-ray without focal consolidation -sepsis ruled out   Abdominal pain/extraperitoneal/retroperitoneal bleed/ Acute Blood Loss Anemia -CT abdomen and pelvis--- Large areas of presumed hematoma involving the rectus abdominismuscles bilaterally, left-greater-than-right. There is also largeextraperitoneal hematoma along the margin of the iliopsoas muscle on the left -transfused 8 units PRBC total for the admission (2 ordered 1/29) -fentanyl prn pain -Hgb had dropped about a gram daily   Supratherapeutic INR -Likely secondary to the patient's recent use of levofloxacin and decreased oral intake in the setting of warfarin use -Vitamin K 20 mg total given for the admission -9 units FFP given  total (last one ordered 1/30) -Daily INR   Status post mechanical aortic valve replacement -Continue anticoagulation with INR 2.5-3.5 -Holding warfarin temporarily secondary to supratherapeutic INR   Aspiration pneumonitis -initially started on zosyn -transition to amox/clav>>2 more days to finish 1 week abx   Essential hypertension -Holding antihypertensives secondary to soft blood pressures intially   Dementia without behavioral disturbance -At risk for hospital delirium   Mixed hyperlipidemia -Restart simvastatin when able to tolerate p.o.   Anxiety/depression -Restart buspirone   Acute on chronic renal failure--CKD stage IV -Baseline creatinine 1.7 -due to hemodynamic changes, IV contrast -lasix IV x 2 as pt has signs of fluid overload -Monitor daily BMP   Thrombocytopenia -due to acute medical illness -serial CBC   Hypokalemia -replete -mag 2.1  Diarrhea -check Cdiff     Goals of Care -confirmed with patient, daughter, spouse>>DNR       Family Communication:   spouse updated 1/29   Consultants:  none   Code Status:  DNR   DVT Prophylaxis:  SCDs     Procedures: As Listed in  Progress Note Above   Antibiotics: Zosyn 1/26>>1/29 Amox/clav  1/29>> Azithro 1/26>>1/28             Subjective: Pt complains of abd pain and diarrhea.  Denies f/c, cp, sob, n/v/  Objective: Vitals:   04/18/22 1416 04/18/22 1431 04/18/22 1500 04/18/22 1624  BP: (!) 153/59 139/76 (!) 147/69   Pulse: (!) 111 (!) 104 (!) 104   Resp: (!) 32 (!) 23 (!) 23   Temp: 98 F (36.7 C) 97.8 F (36.6 C)  97.7 F (36.5 C)  TempSrc: Oral Oral  Oral  SpO2:   94%   Weight:      Height:        Intake/Output Summary (Last 24 hours) at 04/18/2022 1635 Last data filed at 04/18/2022 1529 Gross per 24 hour  Intake 1643.33 ml  Output 400 ml  Net 1243.33 ml   Weight change:  Exam:  General:  Pt is alert, follows commands appropriately, not in acute distress HEENT: No icterus, No thrush, No neck mass, [place]/AT Cardiovascular: RRR, S1/S2, no rubs, no gallops Respiratory: bibasilar rales. No wheeze Abdomen: Soft/+BS, non tender, non distended, no guarding Extremities: 1+ LE edema, No lymphangitis, No petechiae, No rashes, no synovitis   Data Reviewed: I have personally reviewed following labs and imaging studies Basic Metabolic Panel: Recent Labs  Lab 04/14/22 1000 04/15/22 0455 04/16/22 0520 04/17/22 0416 04/18/22 0433  NA 135 134* 133* 136 138  K 4.4 4.1 3.4* 3.0* 3.5  CL 99 102 102 104 107  CO2 24 21* 21* 23 21*  GLUCOSE 125* 146* 164* 129* 144*  BUN 24* 34* 49* 52* 46*  CREATININE 1.77* 1.93* 2.34* 2.06* 1.80*  CALCIUM 8.8* 7.0* 6.8* 7.4* 8.2*  MG  --   --  2.1  --   --    Liver Function Tests: Recent Labs  Lab 04/14/22 1000 04/15/22 0455  AST 29 42*  ALT 13 18  ALKPHOS 89 59  BILITOT 1.7* 2.0*  PROT 7.5 5.7*  ALBUMIN 2.9* 2.4*   No results for input(s): "LIPASE", "AMYLASE" in the last 168 hours. No results for input(s): "AMMONIA" in the last 168 hours. Coagulation Profile: Recent Labs  Lab 04/14/22 1124 04/15/22 0455 04/16/22 0520 04/17/22 0416 04/18/22 0748  INR >10.0* 4.7* 2.3* 1.8* 1.7*   CBC: Recent Labs   Lab 04/14/22 1000 04/14/22 1514 04/15/22 0455 04/15/22 1738 04/16/22 0520 04/16/22 1122 04/17/22 0416 04/17/22 0701 04/18/22 0706  WBC 11.8*  --  12.4*  --  13.7*  --  8.1  --  10.7*  NEUTROABS 9.8*  --   --   --   --   --   --   --   --   HGB 9.3*   < > 7.5*   < > 7.8* 7.6* 6.6* 6.9* 9.9*  HCT 29.6*   < > 22.6*   < > 22.7* 22.6* 19.7* 20.8* 29.6*  MCV 101.0*  --  95.0  --  92.3  --  95.6  --  94.9  PLT 302  --  157  --  121*  --  101*  --  119*   < > = values in this interval not displayed.   Cardiac Enzymes: No results for input(s): "CKTOTAL", "CKMB", "CKMBINDEX", "TROPONINI" in the last 168 hours. BNP: Invalid input(s): "POCBNP" CBG: No results for input(s): "GLUCAP" in the last 168 hours. HbA1C: No results for input(s): "HGBA1C" in the last 72 hours. Urine analysis:    Component Value Date/Time  COLORURINE YELLOW 04/14/2022 2200   APPEARANCEUR CLEAR 04/14/2022 2200   LABSPEC >1.046 (H) 04/14/2022 2200   PHURINE 5.0 04/14/2022 2200   GLUCOSEU NEGATIVE 04/14/2022 2200   HGBUR NEGATIVE 04/14/2022 2200   BILIRUBINUR NEGATIVE 04/14/2022 2200   KETONESUR NEGATIVE 04/14/2022 2200   PROTEINUR NEGATIVE 04/14/2022 2200   UROBILINOGEN 2.0 (H) 11/01/2007 1518   NITRITE NEGATIVE 04/14/2022 2200   LEUKOCYTESUR NEGATIVE 04/14/2022 2200   Sepsis Labs: @LABRCNTIP (procalcitonin:4,lacticidven:4) ) Recent Results (from the past 240 hour(s))  Blood culture (routine x 2)     Status: None (Preliminary result)   Collection Time: 04/14/22 10:30 AM   Specimen: BLOOD  Result Value Ref Range Status   Specimen Description BLOOD LEFT ASSIST CONTROL  Final   Special Requests   Final    BOTTLES DRAWN AEROBIC AND ANAEROBIC Blood Culture adequate volume   Culture   Final    NO GROWTH 3 DAYS Performed at [facility], [address], [place], [place] [address]    Report Status PENDING  Incomplete  Blood culture (routine x 2)     Status: None (Preliminary result)   Collection Time:  04/14/22 10:42 AM   Specimen: BLOOD  Result Value Ref Range Status   Specimen Description BLOOD RIGHT ASSIST CONTROL  Final   Special Requests   Final    BOTTLES DRAWN AEROBIC AND ANAEROBIC Blood Culture adequate volume   Culture   Final    NO GROWTH 3 DAYS Performed at [facility], [address], [place], [place] [address]    Report Status PENDING  Incomplete  Resp panel by RT-PCR (RSV, Flu A&B, Covid) Anterior Nasal Swab     Status: None   Collection Time: 04/14/22  6:00 PM   Specimen: Anterior Nasal Swab  Result Value Ref Range Status   SARS Coronavirus 2 by RT PCR NEGATIVE NEGATIVE Final    Comment: (NOTE) SARS-CoV-2 target nucleic acids are NOT DETECTED.  The SARS-CoV-2 RNA is generally detectable in upper respiratory specimens during the acute phase of infection. The lowest concentration of SARS-CoV-2 viral copies this assay can detect is 138 copies/mL. A negative result does not preclude SARS-Cov-2 infection and should not be used as the sole basis for treatment or other patient management decisions. A negative result may occur with  improper specimen collection/handling, submission of specimen other than nasopharyngeal swab, presence of viral mutation(s) within the areas targeted by this assay, and inadequate number of viral copies(<138 copies/mL). A negative result must be combined with clinical observations, patient history, and epidemiological information. The expected result is Negative.  Fact Sheet for Patients:  EntrepreneurPulse.com.au  Fact Sheet for Healthcare Providers:  IncredibleEmployment.be  This test is no t yet approved or cleared by the [place] FDA and  has been authorized for detection and/or diagnosis of SARS-CoV-2 by FDA under an Emergency Use Authorization (EUA). This EUA will remain  in effect (meaning this test can be used) for the duration of the COVID-19 declaration under Section 564(b)(1) of the Act,  21 U.S.C.section 360bbb-3(b)(1), unless the authorization is terminated  or revoked sooner.       Influenza A by PCR NEGATIVE NEGATIVE Final   Influenza B by PCR NEGATIVE NEGATIVE Final    Comment: (NOTE) The Xpert Xpress SARS-CoV-2/FLU/RSV plus assay is intended as an aid in the diagnosis of influenza from Nasopharyngeal swab specimens and should not be used as a sole basis for treatment. Nasal washings and aspirates are unacceptable for Xpert Xpress SARS-CoV-2/FLU/RSV testing.  Fact Sheet  for Patients: EntrepreneurPulse.com.au  Fact Sheet for Healthcare Providers: IncredibleEmployment.be  This test is not yet approved or cleared by the [place] FDA and has been authorized for detection and/or diagnosis of SARS-CoV-2 by FDA under an Emergency Use Authorization (EUA). This EUA will remain in effect (meaning this test can be used) for the duration of the COVID-19 declaration under Section 564(b)(1) of the Act, 21 U.S.C. section 360bbb-3(b)(1), unless the authorization is terminated or revoked.     Resp Syncytial Virus by PCR NEGATIVE NEGATIVE Final    Comment: (NOTE) Fact Sheet for Patients: EntrepreneurPulse.com.au  Fact Sheet for Healthcare Providers: IncredibleEmployment.be  This test is not yet approved or cleared by the [place] FDA and has been authorized for detection and/or diagnosis of SARS-CoV-2 by FDA under an Emergency Use Authorization (EUA). This EUA will remain in effect (meaning this test can be used) for the duration of the COVID-19 declaration under Section 564(b)(1) of the Act, 21 U.S.C. section 360bbb-3(b)(1), unless the authorization is terminated or revoked.  Performed at [facility], [address], [place], [place] [address]   MRSA Next Gen by PCR, Nasal     Status: None   Collection Time: 04/14/22  6:53 PM   Specimen: Nasal Mucosa; Nasal Swab  Result Value Ref  Range Status   MRSA by PCR Next Gen NOT DETECTED NOT DETECTED Final    Comment: (NOTE) The GeneXpert MRSA Assay (FDA approved for NASAL specimens only), is one component of a comprehensive MRSA colonization surveillance program. It is not intended to diagnose MRSA infection nor to guide or monitor treatment for MRSA infections. Test performance is not FDA approved in patients less than 61 years old. Performed at [facility], [address], [place], [place] [address]      Scheduled Meds:  amoxicillin-clavulanate  1 tablet Oral BID   buPROPion  150 mg Oral q AM   busPIRone  10 mg Oral BID   Chlorhexidine Gluconate Cloth  6 each Topical Daily   furosemide  20 mg Intravenous Once   pantoprazole (PROTONIX) IV  40 mg Intravenous Q12H   simvastatin  40 mg Oral q1800   sodium bicarbonate  650 mg Oral TID   sodium chloride flush  10-40 mL Intracatheter Q12H   Continuous Infusions:  Procedures/Studies: [place] EKG SITE RITE  Result Date: 04/15/2022 If Site Rite image not attached, placement could not be confirmed due to current cardiac rhythm.  CT Angio Abd/Pel w/ and/or w/o  Result Date: 04/14/2022 CLINICAL DATA:  Retroperitoneal bleed suspected, extraperitoneal bleed. EXAM: CTA ABDOMEN AND PELVIS WITHOUT AND WITH CONTRAST TECHNIQUE: Multidetector CT imaging of the abdomen and pelvis was performed using the standard protocol during bolus administration of intravenous contrast. Multiplanar reconstructed images and MIPs were obtained and reviewed to evaluate the vascular anatomy. RADIATION DOSE REDUCTION: This exam was performed according to the departmental dose-optimization program which includes automated exposure control, adjustment of the mA and/or kV according to patient size and/or use of iterative reconstruction technique. CONTRAST:  77mL OMNIPAQUE IOHEXOL 350 MG/ML SOLN COMPARISON:  Noncontrast CT earlier today. FINDINGS: VASCULAR Aorta: Moderate atherosclerosis. No aneurysm, dissection or  acute findings. Celiac: Calcified plaque at the origin causes mild stenosis. Distal branch vessels are patent. No aneurysm, dissection or acute findings. SMA: Mild diffuse atheromatous plaque with mild areas of stenosis. No aneurysm or acute findings. Renals: Mild plaque in the proximal renal arteries causing varying degrees of stenosis up to moderate. No aneurysm or acute findings. IMA: Patent. Inflow: Mild  atherosclerosis without acute findings. Proximal Outflow: Bilateral common femoral and visualized portions of the superficial and profunda femoral arteries are patent without evidence of aneurysm, dissection, vasculitis or significant stenosis. Retroperitoneal and extraperitoneal hematomas. There is no active arterial extravasation within any of the retroperitoneal or extraperitoneal bleed. Veins: Venous phase imaging demonstrates patency of the iliac veins and IVC. Portal, splenic and mesenteric veins are patent. No areas of active extravasation or seen on venous phase. Review of the MIP images confirms the above findings. NON-VASCULAR Lower chest: Right pleural effusion with pleural enhancement, unchanged from earlier today. Hepatobiliary: Nodular hepatic contours typical of cirrhosis. Water density there is a 13 mm low-density lesion in the left lobe of the liver series 12, image 21, measuring slightly higher than simple fluid density, nonspecific. Multiple gallstones without evidence of gallbladder inflammation. Pancreas: Parenchymal atrophy. No ductal dilatation or inflammation. Spleen: No splenomegaly. Adrenals/Urinary Tract: No adrenal nodule or hemorrhage. Bilateral renal parenchymal atrophy with multifocal areas of parenchymal scarring. Parenchymal calcifications in the upper pole the right kidney. No hydronephrosis. Displaced to the right due to left pelvic fluid collections. The bladder appears thick walled which is nonspecific. Stomach/Bowel: No bowel obstruction or inflammation. There is mild  colonic distension with stool proximally and air distally. The appendix is not seen. Lymphatic: No gross adenopathy. Reproductive: Uterine vascular calcifications again seen. Other: Again seen multiple areas of presume extraperitoneal and retroperitoneal hematoma/hemorrhage. Largest area is in the left pelvis measuring approximately 12.9 x 7 cm with hematocrit level, measured on series 12, image 78. This may be contiguous with the left rectus abdominus musculature. More superiorly is an area of presumed rectus hematoma measuring 6.1 x 5.1 cm, measured on series 12, image 67. There is stranding in the left retroperitoneum tracking anterior to the iliopsoas muscle that is primarily ill-defined. Stranding within the right extraperitoneal M with ill-defined hemorrhage. Fluid collections within the upper abdominal wall musculature just underneath the anterior ribs, 8.7 x 3.3 cm on the left series 12, image 30, 4.0 x 2.7 cm on the right series 12, image 31. There is no evidence of active extravasation within any of these hematomas. There is edema within the subcutaneous tissues of the left flank/abdominal wall as well as anterior to the lower abdominal wall musculature. Musculoskeletal: Unchanged from earlier today with L4 compression deformity. Left hip arthroplasty. IMPRESSION: 1. No evidence of active extravasation within any of the retroperitoneal or extraperitoneal fluid collections/presumed hematoma. 2. Multiple areas of presume extraperitoneal and retroperitoneal hematoma/hemorrhage, largest area in the left pelvis measuring 12.9 x 7 cm, likely contiguous with the left rectus abdominus musculature. Allowing for differences in caliper placement in technique, no significant change in size of these collections from earlier today. Possibility of infection is considered but felt less likely. 3. Cirrhosis. Indeterminate 13 mm low-density lesion in the left lobe of the liver measuring slightly higher than simple fluid  density. Recommend further evaluation with MRI on a nonemergent basis after resolution of acute event. 4. Cholelithiasis without acute cholecystitis. 5. Aortic and branch atherosclerosis with areas of arterial stenosis up to moderate in degree, as described. 6. Additional chronic changes are stable from earlier today. Aortic Atherosclerosis (ICD10-I70.0). Electronically Signed   By: Keith Rake M.D.   On: 04/14/2022 16:41   CT CHEST ABDOMEN PELVIS WO CONTRAST  Result Date: 04/14/2022 CLINICAL DATA:  Generalized pain.  Sepsis.  Nausea. EXAM: CT CHEST, ABDOMEN AND PELVIS WITHOUT CONTRAST TECHNIQUE: Multidetector CT imaging of the chest, abdomen and pelvis was performed following  the standard protocol without IV contrast. RADIATION DOSE REDUCTION: This exam was performed according to the departmental dose-optimization program which includes automated exposure control, adjustment of the mA and/or kV according to patient size and/or use of iterative reconstruction technique. COMPARISON:  Chest x-ray earlier 04/14/2022. Older x-rays. CT scan abdomen pelvis 2017 July FINDINGS: CT CHEST FINDINGS Cardiovascular: Heart is enlarged. Trace pericardial fluid. Prominent calcifications along the mitral valve. Postop chest with prosthetic aortic valve. The thoracic aorta has scattered calcified atherosclerotic plaque. Diameter of the ascending aorta at the level of the right pulmonary artery measures 4.3 by 4.0 cm. Mediastinum/Nodes: Small thyroid gland. No specific abnormal lymph node enlargement present in the axillary region, hila or mediastinum on this noncontrast exam. Prominent subcarinal node is noted measuring 17 by 10 mm on series 2, image 30. Normal caliber thoracic esophagus. Lungs/Pleura: Diffuse breathing motion. Small right-sided pleural effusion identified with some adjacent parenchymal opacities. Associated pleural thickening. This effusion was seen in 2017 and with slightly larger that time. More  thickening today of the pleura. Atelectasis versus infiltrate. There is some bandlike areas of opacity as well in the middle lobe. Scattered areas of interstitial septal thickening are identified. There are few patchy ground-glass areas as well in the right upper lobe towards the apex such as series 3, image 42, series 3, image 53. Additional areas scattered in the left upper lobe, middle lobe and superior segment of left lower lobe. This has a differential including atypical or viral process. Recommend follow-up. Musculoskeletal: Osteopenia. Scattered degenerative changes noted along the spine. Of note the patient's arms were scanned at the patient's side. CT ABDOMEN PELVIS FINDINGS Hepatobiliary: Nodular contour to the liver. Please correlate for any known history of chronic liver disease. There are multiple stones in the nondilated gallbladder. Pancreas: Moderate pancreatic atrophy. No obvious mass. Appearance is similar to prior. Spleen: Spleen is grossly nonenlarged. Adrenals/Urinary Tract: Adrenal glands are grossly preserved. There is mild bilateral renal atrophy with some focal areas along the midportion left kidney laterally. Some calcifications noted along the hilum of the right kidney which very well could be vascular. No definite ureteral or renal stones otherwise. The bladder is somewhat decompressed with wall thickening but there is mass effect along the bladder from the adjacent fluid collections. Stomach/Bowel: Stomach is mildly distended with fluid. The small bowel is nondilated. Large bowel is nondilated prominent right-sided colonic stool. Again evaluation is limited by significant motion. Vascular/Lymphatic: Diffuse vascular calcifications are identified with areas of significant stenosis suggested along branch vessels including SMA, renal arteries. Normal caliber IVC. Reproductive: Scattered calcifications along the uterus. These could be vascular. No separate adnexal mass. Other: Significant  motion noted limited evaluation. Extensive areas of presumed hematoma identified. These include the rectus abdominis muscles bilaterally, left-greater-than-right. The left hematoma extends along the course of the entirety of the rectus abdominis muscle, cephalocaudal length approaching 29 cm. Transverse dimension at the level of the pelvis approximate 7.4 by 4.8 cm. Again the right-sided areas are smaller and are along the lower chest and upper abdomen regions. There is also a large extraperitoneal hematoma more on the left than on the right with mass effect along surrounding structures including the bladder. This extends along the margin of the iliacus muscle. Longitudinal length on coronal image 89 of series 5 measures a proximally 18.2 cm. Axial dimension maximally approaches 10.5 by 5.5 cm. Anasarca. Musculoskeletal: Streak artifact related to patient's left hip arthroplasty obscuring the surrounding tissues. Curvature and degenerative changes seen along the spine.  Osteopenia. Multilevel stenosis identified along the lumbar spine. There is moderate compression of the superior endplate of L4 which was not seen in 2017 per the margins are somewhat sclerotic. Favor a subacute to chronic process. Please correlate with history. There is multilevel disc bulging. Degenerative changes seen along the pelvis. Critical Value/emergent results were called by telephone at the time of interpretation on 04/14/2022 at 1:59 pm to provider Airianna Kreischer , who verbally acknowledged these results. IMPRESSION: 1. Large areas of presumed hematoma involving the rectus abdominis muscles bilaterally, left-greater-than-right. There is also large extraperitoneal hematoma along the margin of the iliopsoas muscle on the left. This extends along the margin of the bladder with mass effect. 2. Small right-sided pleural effusion with adjacent parenchymal opacities. This effusion was seen in 2017 and with slightly larger that time. More thickening of  the pleura today. Atelectasis versus infiltrate. 3. Patchy areas of ground-glass in both lungs. This has a differential including atypical or viral process. Recommend follow-up. 4. Cardiomegaly.  Postop chest. 5. Ascending aortic aneurysm measuring 4.3 x 4.0 cm. 6. Nodular contour to the liver. Please correlate for any known history of chronic liver disease. 7. Cholelithiasis. 8. Diffuse vascular calcifications with areas of significant stenosis along branch vessels including SMA, renal arteries. 9. Moderate compression of the superior endplate of L4, not seen in 2017 per the margins are somewhat sclerotic. Favor a subacute to chronic process. Please correlate with history. 10. Aortic atherosclerosis. Aortic Atherosclerosis (ICD10-I70.0). Electronically Signed   By: Jill Side M.D.   On: 04/14/2022 14:04   DG Chest 2 View  Result Date: 04/14/2022 CLINICAL DATA:  Fall EXAM: CHEST - 2 VIEW COMPARISON:  Chest x-ray dated September 23, 2021 FINDINGS: Cardiac and mediastinal contours are unchanged post median sternotomy. Left-greater-than-right pleural effusions. No evidence of pneumothorax. Chronic appearing anterior second right rib fracture. IMPRESSION: Left-greater-than-right pleural effusions bibasilar atelectasis, similar to prior. Electronically Signed   By: Yetta Glassman M.D.   On: 04/14/2022 10:04    Orson Eva, DO  [facility]  If 7PM-7AM, please contact night-coverage [email or web address] Password [facility] 04/18/2022, 4:35 PM   LOS: 4 days

## 2022-04-18 NOTE — Progress Notes (Signed)
Daily Progress Note   Patient Name: Mary [name]       Date: 04/18/2022 DOB: August 31, 1940  Age: 82 y.o. MRN#: [id number] Attending Physician: Orson Eva, MD Primary Care Physician: Pcp, No Admit Date: 04/14/2022  Reason for Consultation/Follow-up: Establishing goals of care  Patient Profile/HPI:  82 y.o. female  with past medical history of dementia, HTN, HLD, CKD3, anxiety, mechanical aortic valve, stroke, dCHF admitted on 04/14/2022 with shortness of breath, progressing weakness and abdominal pain. CT abdomen revealed large hematoma by the bilateral rectus abdominis left greater than right, and large extraperitoneal hematoma by the iliopsoas muscle. She has acute blood loss anemia, today Hgb is 6.9- 6.3 on admission. Has received multiple units of FFP and PRBC's. Her INR was elevated on admission at greater than 10. Receiving vitamin K. Today it is 1.8- coumadin on hold.  She had bump in Cr-  2.34 yesterday, down to 2.06 today. Palliative medicine consulted for family support in patient with critical illness.      Subjective: Chart reviewed including labs, progress notes, imaging from this and previous encounters.  Hgb improved this am at 9.9, her color is better.  Amore is awake and alert. Tells me she is tired. No pain. Eating and drinking well.  No family at bedside.   Review of Systems  Constitutional:  Positive for malaise/fatigue.     Physical Exam Vitals and nursing note reviewed.  Cardiovascular:     Comments: Tachycardic  Pulmonary:     Effort: Pulmonary effort is normal.  Skin:    General: Skin is warm and dry.  Neurological:     Mental Status: She is alert and oriented to person, place, and time.             Vital Signs: BP (!) 153/59   Pulse (!) 111   Temp 98 F (36.7  C) (Oral)   Resp (!) 32   Ht 5\' 7"  (1.702 m)   Wt 66.5 kg   SpO2 93%   BMI 22.96 kg/m  SpO2: SpO2: 93 % O2 Device: O2 Device: Room Air O2 Flow Rate: O2 Flow Rate (L/min): 2 L/min  Intake/output summary:  Intake/Output Summary (Last 24 hours) at 04/18/2022 1424 Last data filed at 04/18/2022 1416 Gross per 24 hour  Intake 2653 ml  Output 400 ml  Net  2253 ml   LBM: Last BM Date : 04/18/22 Baseline Weight: Weight: 63.7 kg Most recent weight: Weight: 66.5 kg       Palliative Assessment/Data: PPS: 40%      Patient Active Problem List   Diagnosis Date Noted   Pneumonia due to infectious organism 04/17/2022   Acute blood loss anemia 04/17/2022   Retroperitoneal bleed 04/17/2022   Palliative care by specialist 04/17/2022   Aspiration pneumonitis ([place]) 04/16/2022   Sepsis due to undetermined organism ([place]) 04/14/2022   CKD (chronic kidney disease) stage 4, GFR 15-29 ml/min ([place]) 04/14/2022   Acquired thrombophilia ([place]) 04/14/2022   Elevated INR 04/14/2022   Chronic diastolic CHF (congestive heart failure) ([place])    Pelvis fracture ([place]) 12/18/2020   Malnutrition of moderate degree 08/13/2020   Closed displaced fracture of left femoral neck ([place]) 08/13/2020   Hip fracture ([place]) 08/11/2020   Agitation 02/21/2020   Confusion 02/21/2020   Hyperkalemia 01/15/2020   Acute renal failure superimposed on stage 4 chronic kidney disease ([place]) 01/15/2020   Anticoagulant long-term use 01/15/2020   Other secondary pulmonary hypertension ([place]) 10/28/2019   Acute on chronic combined systolic and diastolic CHF (congestive heart failure) ([place])    Anasarca 04/25/2019   Hypoalbuminemia 04/25/2019   Persistent atrial fibrillation ([place]) 04/25/2019   CKD (chronic kidney disease), stage III ([place]) 04/25/2019   Anemia due to chronic kidney disease 04/25/2019   Pressure injury of skin 04/24/2019   Fall 04/23/2019   S/P right knee arthroscopy 04/13/2015   OSA (obstructive sleep apnea)  01/01/2013   Type 2 diabetes mellitus with hyperlipidemia ([place]) 10/23/2012   H/O mechanical aortic valve replacement 06/30/2010   Hyperlipidemia 09/29/2008   Obesity 09/29/2008   Essential hypertension 09/29/2008   DEGENERATIVE JOINT DISEASE 09/29/2008    Palliative Care Assessment & Plan    Assessment/Recommendations/Plan  Continue current plan DNR- full scope PMT will continue to follow   Code Status: DNR  Prognosis:  Unable to determine  Discharge Planning: To Be Determined  Thank you for allowing the Palliative Medicine Team to assist in the care of this patient.  Total time: 35 minutes   Greater than 50%  of this time was spent counseling and coordinating care related to the above assessment and plan.  Mariana Kaufman, AGNP-C Palliative Medicine   Please contact Palliative Medicine Team phone at [phone] for questions and concerns.

## 2022-04-19 DIAGNOSIS — J690 Pneumonitis due to inhalation of food and vomit: Secondary | ICD-10-CM | POA: Diagnosis not present

## 2022-04-19 DIAGNOSIS — D6869 Other thrombophilia: Secondary | ICD-10-CM | POA: Diagnosis not present

## 2022-04-19 DIAGNOSIS — N183 Chronic kidney disease, stage 3 unspecified: Secondary | ICD-10-CM

## 2022-04-19 DIAGNOSIS — Z515 Encounter for palliative care: Secondary | ICD-10-CM | POA: Diagnosis not present

## 2022-04-19 DIAGNOSIS — A419 Sepsis, unspecified organism: Secondary | ICD-10-CM | POA: Diagnosis not present

## 2022-04-19 DIAGNOSIS — D631 Anemia in chronic kidney disease: Secondary | ICD-10-CM

## 2022-04-19 LAB — PROTIME-INR
INR: 1.6 — ABNORMAL HIGH (ref 0.8–1.2)
Prothrombin Time: 19.1 seconds — ABNORMAL HIGH (ref 11.4–15.2)

## 2022-04-19 LAB — CBC
HCT: 29 % — ABNORMAL LOW (ref 36.0–46.0)
Hemoglobin: 9.4 g/dL — ABNORMAL LOW (ref 12.0–15.0)
MCH: 32 pg (ref 26.0–34.0)
MCHC: 32.4 g/dL (ref 30.0–36.0)
MCV: 98.6 fL (ref 80.0–100.0)
Platelets: 110 10*3/uL — ABNORMAL LOW (ref 150–400)
RBC: 2.94 MIL/uL — ABNORMAL LOW (ref 3.87–5.11)
RDW: 17.4 % — ABNORMAL HIGH (ref 11.5–15.5)
WBC: 8.4 10*3/uL (ref 4.0–10.5)
nRBC: 2.1 % — ABNORMAL HIGH (ref 0.0–0.2)

## 2022-04-19 LAB — CULTURE, BLOOD (ROUTINE X 2)
Culture: NO GROWTH
Culture: NO GROWTH
Special Requests: ADEQUATE
Special Requests: ADEQUATE

## 2022-04-19 LAB — BASIC METABOLIC PANEL
Anion gap: 5 (ref 5–15)
BUN: 40 mg/dL — ABNORMAL HIGH (ref 8–23)
CO2: 26 mmol/L (ref 22–32)
Calcium: 8.3 mg/dL — ABNORMAL LOW (ref 8.9–10.3)
Chloride: 106 mmol/L (ref 98–111)
Creatinine, Ser: 1.47 mg/dL — ABNORMAL HIGH (ref 0.44–1.00)
GFR, Estimated: 35 mL/min — ABNORMAL LOW (ref >60)
Glucose, Bld: 92 mg/dL (ref 70–99)
Potassium: 3.1 mmol/L — ABNORMAL LOW (ref 3.5–5.1)
Sodium: 137 mmol/L (ref 135–145)

## 2022-04-19 LAB — MAGNESIUM: Magnesium: 2.2 mg/dL (ref 1.7–2.4)

## 2022-04-19 MED ORDER — LOPERAMIDE HCL 2 MG PO CAPS
2.0000 mg | ORAL_CAPSULE | ORAL | Status: DC | PRN
  Administered 2022-04-19 – 2022-04-27 (×9): 2 mg via ORAL
  Filled 2022-04-19 (×9): qty 1

## 2022-04-19 MED ORDER — IPRATROPIUM-ALBUTEROL 0.5-2.5 (3) MG/3ML IN SOLN
3.0000 mL | Freq: Four times a day (QID) | RESPIRATORY_TRACT | Status: DC | PRN
  Administered 2022-04-20: 3 mL via RESPIRATORY_TRACT
  Filled 2022-04-19: qty 3

## 2022-04-19 MED ORDER — FUROSEMIDE 10 MG/ML IJ SOLN
40.0000 mg | Freq: Once | INTRAMUSCULAR | Status: AC
  Administered 2022-04-19: 40 mg via INTRAVENOUS
  Filled 2022-04-19: qty 4

## 2022-04-19 MED ORDER — DM-GUAIFENESIN ER 30-600 MG PO TB12
1.0000 | ORAL_TABLET | Freq: Two times a day (BID) | ORAL | Status: AC
  Administered 2022-04-19 – 2022-04-23 (×9): 1 via ORAL
  Filled 2022-04-19 (×9): qty 1

## 2022-04-19 MED ORDER — SACCHAROMYCES BOULARDII 250 MG PO CAPS
250.0000 mg | ORAL_CAPSULE | Freq: Two times a day (BID) | ORAL | Status: DC
  Administered 2022-04-19 – 2022-04-28 (×17): 250 mg via ORAL
  Filled 2022-04-19 (×17): qty 1

## 2022-04-19 MED ORDER — POTASSIUM CHLORIDE CRYS ER 20 MEQ PO TBCR
40.0000 meq | EXTENDED_RELEASE_TABLET | Freq: Once | ORAL | Status: AC
  Administered 2022-04-19: 40 meq via ORAL
  Filled 2022-04-19: qty 2

## 2022-04-19 NOTE — Progress Notes (Signed)
PROGRESS NOTE  Mary [name] OVF:[id number] DOB: 1940/11/14 DOA: 04/14/2022 PCP: Pcp, No  Brief History:  82 year old female with a history of dementia, hypertension, hyperlipidemia, CKD stage III, anxiety, mechanical aortic valve, stroke, diastolic CHF, and anxiety presenting from [facility] with generalized weakness for 3 to 4 days.  She has had associated decreased oral intake and nausea.  The patient is a difficult historian.  The patient's spouse is at the bedside.  He is also a difficult historian.  Nevertheless, it appears that the patient was treated for pneumonia.  Review of the [facility] from [facility] shows the patient was started on levofloxacin on 04/08/2022 for 5-day course.  Even with the antibiotics, the patient continued to have progressive generalized weakness.  She denies any headache, neck pain, chest pain, hemoptysis, vomiting, diarrhea.  She does complain of abdominal pain.  She states that the abdominal pain has been present for 3 to 4 days.  There is no hematochezia or melena.  She has some dyspnea with exertion. In the ED, the patient was afebrile hemodynamically stable with oxygen saturation 99% on room air.  WBC 10.8, hemoglobin 9.3, platelets 302,000.  Sodium 135, potassium 4.4, bicarbonate 24, serum creatinine 1.77.  The patient was started on IV ceftriaxone and azithromycin and IV fluids.  She was admitted for further evaluation and treatment.   Assessment/Plan: SIRS -concerned about intra-abdominal source of infection initially -CT abd--- Large areas of presumed hematoma involving the rectus abdominis muscles bilaterally, left-greater-than-right. There is also large extraperitoneal hematoma along the margin of the iliopsoas muscle on the left. -presented with leukocytosis, tachycardia, elevated lactate -follow blood culture--neg to date -UA neg for pyuria -Lactic acid peaked 2.2 -Continue IV fluids>>saline locked -Treated with IV Zosyn and subsequently transition  to Augmentin on 04/18/2022; planning for 1 more day of antibiotics -Chest x-ray without focal consolidation -sepsis ruled out   Abdominal pain/extraperitoneal/retroperitoneal bleed/ Acute Blood Loss Anemia -CT abdomen and pelvis--- Large areas of presumed hematoma involving the rectus abdominismuscles bilaterally, left-greater-than-right. There is also largeextraperitoneal hematoma along the margin of the iliopsoas muscle on the left -Status post 8 units PRBC transfused throughout hospitalization. -Patient has also received fresh frozen plasma transfusion. -fentanyl prn pain -Continue to follow hemoglobin trend.   Supratherapeutic INR -Likely secondary to the patient's recent use of levofloxacin and decreased oral intake in the setting of warfarin use -Vitamin K 20 mg total given for the admission -9 units FFP given  total (last one ordered 1/30) -Continue Daily INR   Status post mechanical aortic valve replacement -Continue anticoagulation with INR goal of 2.5-3.5 -Holding warfarin temporarily secondary to supratherapeutic INR -Resume anticoagulation once hemoglobin stable.   Aspiration pneumonitis -initially started on zosyn -Successfully transitioned to amox/clav>> planning for 1 more days to finish abx therapy. -Continue the use of Mucinex, flutter valve and as needed bronchodilator.   Essential hypertension -Vital signs stable -Continue to follow blood pressure trend and resume antihypertensive agents as required.   Dementia without behavioral disturbance -At risk for hospital delirium -Continue constant reorientation.   Mixed hyperlipidemia -Resume the use of simvastatin at time of discharge. -Continue heart healthy diet.   Anxiety/depression -Continue BuSpar   Acute on chronic renal failure--CKD stage IV -Baseline creatinine 1.7 -due to hemodynamic changes, IV contrast -Still with sound crackles appreciated ; Another dose of IV Lasix will be provided. -Continue to  monitor daily basic metabolic panel.   Thrombocytopenia -due to acute medical illness -Continue to  follow platelet counts trend/stability.   Hypokalemia -In the setting of GI losses and decreased oral intake -Magnesium within normal limit -Continue electrolyte repletion and follow trend.  Diarrhea -C. difficile PCR negative -Most likely triggered by the use of antibiotics -Continue supportive care.   Goals of Care -confirmed with patient, daughter, spouse>>DNR -Continue current goals of care       Family Communication:   No further bedside.   Consultants:  none   Code Status:  DNR   DVT Prophylaxis:  SCDs     Procedures: As Listed in Progress Note Above   Antibiotics: Zosyn 1/26>>1/29 Amox/clav 1/29>> Azithro 1/26>>1/28   Subjective: No fever, no chest pain, no nausea, no vomiting.  Breathing continues to improve.  2 L nasal cannula supplementation in place.  Reporting abdominal discomfort for and some loose stools.   Objective: Vitals:   04/19/22 1100 04/19/22 1200 04/19/22 1300 04/19/22 1602  BP: 129/62 (!) 144/54 (!) 141/60   Pulse: 97 93 96   Resp: (!) 31 (!) 24 (!) 24   Temp: 97.7 F (36.5 C)   98.4 F (36.9 C)  TempSrc:    Oral  SpO2: 98% 99% 96%   Weight:      Height:        Intake/Output Summary (Last 24 hours) at 04/19/2022 1811 Last data filed at 04/19/2022 1305 Gross per 24 hour  Intake 120 ml  Output 500 ml  Net -380 ml   Weight change:  Exam: General exam: Alert, awake, oriented x 3; reporting feeling weak and tired.  No chest pain, no nausea, no vomiting. Respiratory system: Patient demonstrated improvement in her respiratory status; still with decreased breath sounds at the bases and mild fine crackles.  No wheezing.  2 L nasal cannula supplementation in place. Cardiovascular system: Rate controlled, no rubs, no gallops, no JVD. Gastrointestinal system: Abdomen is nondistended, soft and nontender to palpation.  Continues to  demonstrate positive bruises/hematoma in her flank areas. Central nervous system: No focal neurological deficits. Extremities: No cyanosis or clubbing; trace edema appreciated bilaterally. Skin: No petechiae. Psychiatry: Judgement and insight appear normal. Mood & affect appropriate.   Data Reviewed: I have personally reviewed following labs and imaging studies  Basic Metabolic Panel: Recent Labs  Lab 04/15/22 0455 04/16/22 0520 04/17/22 0416 04/18/22 0433 04/19/22 0421  NA 134* 133* 136 138 137  K 4.1 3.4* 3.0* 3.5 3.1*  CL 102 102 104 107 106  CO2 21* 21* 23 21* 26  GLUCOSE 146* 164* 129* 144* 92  BUN 34* 49* 52* 46* 40*  CREATININE 1.93* 2.34* 2.06* 1.80* 1.47*  CALCIUM 7.0* 6.8* 7.4* 8.2* 8.3*  MG  --  2.1  --   --  2.2   Liver Function Tests: Recent Labs  Lab 04/14/22 1000 04/15/22 0455  AST 29 42*  ALT 13 18  ALKPHOS 89 59  BILITOT 1.7* 2.0*  PROT 7.5 5.7*  ALBUMIN 2.9* 2.4*   Coagulation Profile: Recent Labs  Lab 04/15/22 0455 04/16/22 0520 04/17/22 0416 04/18/22 0748 04/19/22 0421  INR 4.7* 2.3* 1.8* 1.7* 1.6*   CBC: Recent Labs  Lab 04/14/22 1000 04/14/22 1514 04/15/22 0455 04/15/22 1738 04/16/22 0520 04/16/22 1122 04/17/22 0416 04/17/22 0701 04/18/22 0706 04/19/22 0421  WBC 11.8*  --  12.4*  --  13.7*  --  8.1  --  10.7* 8.4  NEUTROABS 9.8*  --   --   --   --   --   --   --   --   --  HGB 9.3*   < > 7.5*   < > 7.8* 7.6* 6.6* 6.9* 9.9* 9.4*  HCT 29.6*   < > 22.6*   < > 22.7* 22.6* 19.7* 20.8* 29.6* 29.0*  MCV 101.0*  --  95.0  --  92.3  --  95.6  --  94.9 98.6  PLT 302  --  157  --  121*  --  101*  --  119* 110*   < > = values in this interval not displayed.   Urine analysis:    Component Value Date/Time   COLORURINE YELLOW 04/14/2022 2200   APPEARANCEUR CLEAR 04/14/2022 2200   LABSPEC >1.046 (H) 04/14/2022 2200   PHURINE 5.0 04/14/2022 2200   GLUCOSEU NEGATIVE 04/14/2022 2200   HGBUR NEGATIVE 04/14/2022 2200   BILIRUBINUR  NEGATIVE 04/14/2022 2200   KETONESUR NEGATIVE 04/14/2022 2200   PROTEINUR NEGATIVE 04/14/2022 2200   UROBILINOGEN 2.0 (H) 11/01/2007 1518   NITRITE NEGATIVE 04/14/2022 2200   LEUKOCYTESUR NEGATIVE 04/14/2022 2200   Sepsis Labs:  Recent Results (from the past 240 hour(s))  Blood culture (routine x 2)     Status: None   Collection Time: 04/14/22 10:30 AM   Specimen: BLOOD  Result Value Ref Range Status   Specimen Description   Final    BLOOD LEFT ASSIST CONTROL Performed at [facility], [address], [place], [place] [address]    Special Requests   Final    BOTTLES DRAWN AEROBIC AND ANAEROBIC Blood Culture adequate volume Performed at [facility], [address] [place], [place] [address]    Culture   Final    NO GROWTH 5 DAYS Performed at [facility], [place] [address], [place], [place] [address]    Report Status 04/19/2022 FINAL  Final  Blood culture (routine x 2)     Status: None   Collection Time: 04/14/22 10:42 AM   Specimen: BLOOD  Result Value Ref Range Status   Specimen Description   Final    BLOOD RIGHT ASSIST CONTROL Performed at [facility], [address] [place], [place] [address]    Special Requests   Final    BOTTLES DRAWN AEROBIC AND ANAEROBIC Blood Culture adequate volume Performed at [facility], [address], [place], [place] [address]    Culture   Final    NO GROWTH 5 DAYS Performed at [facility], [place] [address] [place], [place] [address]    Report Status 04/19/2022 FINAL  Final  Resp panel by RT-PCR (RSV, Flu A&B, Covid) Anterior Nasal Swab     Status: None   Collection Time: 04/14/22  6:00 PM   Specimen: Anterior Nasal Swab  Result Value Ref Range Status   SARS Coronavirus 2 by RT PCR NEGATIVE NEGATIVE Final    Comment: (NOTE) SARS-CoV-2 target nucleic acids are NOT DETECTED.  The SARS-CoV-2 RNA is generally detectable in upper respiratory specimens during the acute phase of infection. The lowest concentration of SARS-CoV-2  viral copies this assay can detect is 138 copies/mL. A negative result does not preclude SARS-Cov-2 infection and should not be used as the sole basis for treatment or other patient management decisions. A negative result may occur with  improper specimen collection/handling, submission of specimen other than nasopharyngeal swab, presence of viral mutation(s) within the areas targeted by this assay, and inadequate number of viral copies(<138 copies/mL). A negative result must be combined with clinical observations, patient history, and epidemiological information. The expected result is Negative.  Fact Sheet for  Patients:  EntrepreneurPulse.com.au  Fact Sheet for Healthcare Providers:  IncredibleEmployment.be  This test is no t yet approved or cleared by the [place] FDA and  has been authorized for detection and/or diagnosis of SARS-CoV-2 by FDA under an Emergency Use Authorization (EUA). This EUA will remain  in effect (meaning this test can be used) for the duration of the COVID-19 declaration under Section 564(b)(1) of the Act, 21 U.S.C.section 360bbb-3(b)(1), unless the authorization is terminated  or revoked sooner.       Influenza A by PCR NEGATIVE NEGATIVE Final   Influenza B by PCR NEGATIVE NEGATIVE Final    Comment: (NOTE) The Xpert Xpress SARS-CoV-2/FLU/RSV plus assay is intended as an aid in the diagnosis of influenza from Nasopharyngeal swab specimens and should not be used as a sole basis for treatment. Nasal washings and aspirates are unacceptable for Xpert Xpress SARS-CoV-2/FLU/RSV testing.  Fact Sheet for Patients: EntrepreneurPulse.com.au  Fact Sheet for Healthcare Providers: IncredibleEmployment.be  This test is not yet approved or cleared by the [place] FDA and has been authorized for detection and/or diagnosis of SARS-CoV-2 by FDA under an Emergency Use Authorization  (EUA). This EUA will remain in effect (meaning this test can be used) for the duration of the COVID-19 declaration under Section 564(b)(1) of the Act, 21 U.S.C. section 360bbb-3(b)(1), unless the authorization is terminated or revoked.     Resp Syncytial Virus by PCR NEGATIVE NEGATIVE Final    Comment: (NOTE) Fact Sheet for Patients: EntrepreneurPulse.com.au  Fact Sheet for Healthcare Providers: IncredibleEmployment.be  This test is not yet approved or cleared by the [place] FDA and has been authorized for detection and/or diagnosis of SARS-CoV-2 by FDA under an Emergency Use Authorization (EUA). This EUA will remain in effect (meaning this test can be used) for the duration of the COVID-19 declaration under Section 564(b)(1) of the Act, 21 U.S.C. section 360bbb-3(b)(1), unless the authorization is terminated or revoked.  Performed at [facility], [address] [place], [place] [address]   MRSA Next Gen by PCR, Nasal     Status: None   Collection Time: 04/14/22  6:53 PM   Specimen: Nasal Mucosa; Nasal Swab  Result Value Ref Range Status   MRSA by PCR Next Gen NOT DETECTED NOT DETECTED Final    Comment: (NOTE) The GeneXpert MRSA Assay (FDA approved for NASAL specimens only), is one component of a comprehensive MRSA colonization surveillance program. It is not intended to diagnose MRSA infection nor to guide or monitor treatment for MRSA infections. Test performance is not FDA approved in patients less than 75 years old. Performed at [facility], [address], [place], [place] [address]   C Difficile Quick Screen (NO PCR Reflex)     Status: None   Collection Time: 04/18/22  4:52 AM   Specimen: STOOL  Result Value Ref Range Status   C Diff antigen NEGATIVE NEGATIVE Final   C Diff toxin NEGATIVE NEGATIVE Final   C Diff interpretation No C. difficile detected.  Final    Comment: Performed at [facility], [address],  [place], [place] [address]     Scheduled Meds:  amoxicillin-clavulanate  1 tablet Oral BID   buPROPion  150 mg Oral q AM   busPIRone  10 mg Oral BID   Chlorhexidine Gluconate Cloth  6 each Topical Daily   dextromethorphan-guaiFENesin  1 tablet Oral BID   pantoprazole (PROTONIX) IV  40 mg Intravenous Q12H   simvastatin  40 mg Oral q1800  sodium bicarbonate  650 mg Oral TID   sodium chloride flush  10-40 mL Intracatheter Q12H   Continuous Infusions:  Procedures/Studies: [place] EKG SITE RITE  Result Date: 04/15/2022 If Site Rite image not attached, placement could not be confirmed due to current cardiac rhythm.  CT Angio Abd/Pel w/ and/or w/o  Result Date: 04/14/2022 CLINICAL DATA:  Retroperitoneal bleed suspected, extraperitoneal bleed. EXAM: CTA ABDOMEN AND PELVIS WITHOUT AND WITH CONTRAST TECHNIQUE: Multidetector CT imaging of the abdomen and pelvis was performed using the standard protocol during bolus administration of intravenous contrast. Multiplanar reconstructed images and MIPs were obtained and reviewed to evaluate the vascular anatomy. RADIATION DOSE REDUCTION: This exam was performed according to the departmental dose-optimization program which includes automated exposure control, adjustment of the mA and/or kV according to patient size and/or use of iterative reconstruction technique. CONTRAST:  38mL OMNIPAQUE IOHEXOL 350 MG/ML SOLN COMPARISON:  Noncontrast CT earlier today. FINDINGS: VASCULAR Aorta: Moderate atherosclerosis. No aneurysm, dissection or acute findings. Celiac: Calcified plaque at the origin causes mild stenosis. Distal branch vessels are patent. No aneurysm, dissection or acute findings. SMA: Mild diffuse atheromatous plaque with mild areas of stenosis. No aneurysm or acute findings. Renals: Mild plaque in the proximal renal arteries causing varying degrees of stenosis up to moderate. No aneurysm or acute findings. IMA: Patent. Inflow: Mild atherosclerosis without acute  findings. Proximal Outflow: Bilateral common femoral and visualized portions of the superficial and profunda femoral arteries are patent without evidence of aneurysm, dissection, vasculitis or significant stenosis. Retroperitoneal and extraperitoneal hematomas. There is no active arterial extravasation within any of the retroperitoneal or extraperitoneal bleed. Veins: Venous phase imaging demonstrates patency of the iliac veins and IVC. Portal, splenic and mesenteric veins are patent. No areas of active extravasation or seen on venous phase. Review of the MIP images confirms the above findings. NON-VASCULAR Lower chest: Right pleural effusion with pleural enhancement, unchanged from earlier today. Hepatobiliary: Nodular hepatic contours typical of cirrhosis. Water density there is a 13 mm low-density lesion in the left lobe of the liver series 12, image 21, measuring slightly higher than simple fluid density, nonspecific. Multiple gallstones without evidence of gallbladder inflammation. Pancreas: Parenchymal atrophy. No ductal dilatation or inflammation. Spleen: No splenomegaly. Adrenals/Urinary Tract: No adrenal nodule or hemorrhage. Bilateral renal parenchymal atrophy with multifocal areas of parenchymal scarring. Parenchymal calcifications in the upper pole the right kidney. No hydronephrosis. Displaced to the right due to left pelvic fluid collections. The bladder appears thick walled which is nonspecific. Stomach/Bowel: No bowel obstruction or inflammation. There is mild colonic distension with stool proximally and air distally. The appendix is not seen. Lymphatic: No gross adenopathy. Reproductive: Uterine vascular calcifications again seen. Other: Again seen multiple areas of presume extraperitoneal and retroperitoneal hematoma/hemorrhage. Largest area is in the left pelvis measuring approximately 12.9 x 7 cm with hematocrit level, measured on series 12, image 78. This may be contiguous with the left rectus  abdominus musculature. More superiorly is an area of presumed rectus hematoma measuring 6.1 x 5.1 cm, measured on series 12, image 67. There is stranding in the left retroperitoneum tracking anterior to the iliopsoas muscle that is primarily ill-defined. Stranding within the right extraperitoneal M with ill-defined hemorrhage. Fluid collections within the upper abdominal wall musculature just underneath the anterior ribs, 8.7 x 3.3 cm on the left series 12, image 30, 4.0 x 2.7 cm on the right series 12, image 31. There is no evidence of active extravasation within any of these hematomas. There is edema  within the subcutaneous tissues of the left flank/abdominal wall as well as anterior to the lower abdominal wall musculature. Musculoskeletal: Unchanged from earlier today with L4 compression deformity. Left hip arthroplasty. IMPRESSION: 1. No evidence of active extravasation within any of the retroperitoneal or extraperitoneal fluid collections/presumed hematoma. 2. Multiple areas of presume extraperitoneal and retroperitoneal hematoma/hemorrhage, largest area in the left pelvis measuring 12.9 x 7 cm, likely contiguous with the left rectus abdominus musculature. Allowing for differences in caliper placement in technique, no significant change in size of these collections from earlier today. Possibility of infection is considered but felt less likely. 3. Cirrhosis. Indeterminate 13 mm low-density lesion in the left lobe of the liver measuring slightly higher than simple fluid density. Recommend further evaluation with MRI on a nonemergent basis after resolution of acute event. 4. Cholelithiasis without acute cholecystitis. 5. Aortic and branch atherosclerosis with areas of arterial stenosis up to moderate in degree, as described. 6. Additional chronic changes are stable from earlier today. Aortic Atherosclerosis (ICD10-I70.0). Electronically Signed   By: Keith Rake M.D.   On: 04/14/2022 16:41   CT CHEST  ABDOMEN PELVIS WO CONTRAST  Result Date: 04/14/2022 CLINICAL DATA:  Generalized pain.  Sepsis.  Nausea. EXAM: CT CHEST, ABDOMEN AND PELVIS WITHOUT CONTRAST TECHNIQUE: Multidetector CT imaging of the chest, abdomen and pelvis was performed following the standard protocol without IV contrast. RADIATION DOSE REDUCTION: This exam was performed according to the departmental dose-optimization program which includes automated exposure control, adjustment of the mA and/or kV according to patient size and/or use of iterative reconstruction technique. COMPARISON:  Chest x-ray earlier 04/14/2022. Older x-rays. CT scan abdomen pelvis 2017 July FINDINGS: CT CHEST FINDINGS Cardiovascular: Heart is enlarged. Trace pericardial fluid. Prominent calcifications along the mitral valve. Postop chest with prosthetic aortic valve. The thoracic aorta has scattered calcified atherosclerotic plaque. Diameter of the ascending aorta at the level of the right pulmonary artery measures 4.3 by 4.0 cm. Mediastinum/Nodes: Small thyroid gland. No specific abnormal lymph node enlargement present in the axillary region, hila or mediastinum on this noncontrast exam. Prominent subcarinal node is noted measuring 17 by 10 mm on series 2, image 30. Normal caliber thoracic esophagus. Lungs/Pleura: Diffuse breathing motion. Small right-sided pleural effusion identified with some adjacent parenchymal opacities. Associated pleural thickening. This effusion was seen in 2017 and with slightly larger that time. More thickening today of the pleura. Atelectasis versus infiltrate. There is some bandlike areas of opacity as well in the middle lobe. Scattered areas of interstitial septal thickening are identified. There are few patchy ground-glass areas as well in the right upper lobe towards the apex such as series 3, image 42, series 3, image 53. Additional areas scattered in the left upper lobe, middle lobe and superior segment of left lower lobe. This has a  differential including atypical or viral process. Recommend follow-up. Musculoskeletal: Osteopenia. Scattered degenerative changes noted along the spine. Of note the patient's arms were scanned at the patient's side. CT ABDOMEN PELVIS FINDINGS Hepatobiliary: Nodular contour to the liver. Please correlate for any known history of chronic liver disease. There are multiple stones in the nondilated gallbladder. Pancreas: Moderate pancreatic atrophy. No obvious mass. Appearance is similar to prior. Spleen: Spleen is grossly nonenlarged. Adrenals/Urinary Tract: Adrenal glands are grossly preserved. There is mild bilateral renal atrophy with some focal areas along the midportion left kidney laterally. Some calcifications noted along the hilum of the right kidney which very well could be vascular. No definite ureteral or renal stones otherwise. The  bladder is somewhat decompressed with wall thickening but there is mass effect along the bladder from the adjacent fluid collections. Stomach/Bowel: Stomach is mildly distended with fluid. The small bowel is nondilated. Large bowel is nondilated prominent right-sided colonic stool. Again evaluation is limited by significant motion. Vascular/Lymphatic: Diffuse vascular calcifications are identified with areas of significant stenosis suggested along branch vessels including SMA, renal arteries. Normal caliber IVC. Reproductive: Scattered calcifications along the uterus. These could be vascular. No separate adnexal mass. Other: Significant motion noted limited evaluation. Extensive areas of presumed hematoma identified. These include the rectus abdominis muscles bilaterally, left-greater-than-right. The left hematoma extends along the course of the entirety of the rectus abdominis muscle, cephalocaudal length approaching 29 cm. Transverse dimension at the level of the pelvis approximate 7.4 by 4.8 cm. Again the right-sided areas are smaller and are along the lower chest and upper  abdomen regions. There is also a large extraperitoneal hematoma more on the left than on the right with mass effect along surrounding structures including the bladder. This extends along the margin of the iliacus muscle. Longitudinal length on coronal image 89 of series 5 measures a proximally 18.2 cm. Axial dimension maximally approaches 10.5 by 5.5 cm. Anasarca. Musculoskeletal: Streak artifact related to patient's left hip arthroplasty obscuring the surrounding tissues. Curvature and degenerative changes seen along the spine. Osteopenia. Multilevel stenosis identified along the lumbar spine. There is moderate compression of the superior endplate of L4 which was not seen in 2017 per the margins are somewhat sclerotic. Favor a subacute to chronic process. Please correlate with history. There is multilevel disc bulging. Degenerative changes seen along the pelvis. Critical Value/emergent results were called by telephone at the time of interpretation on 04/14/2022 at 1:59 pm to provider DAVID TAT , who verbally acknowledged these results. IMPRESSION: 1. Large areas of presumed hematoma involving the rectus abdominis muscles bilaterally, left-greater-than-right. There is also large extraperitoneal hematoma along the margin of the iliopsoas muscle on the left. This extends along the margin of the bladder with mass effect. 2. Small right-sided pleural effusion with adjacent parenchymal opacities. This effusion was seen in 2017 and with slightly larger that time. More thickening of the pleura today. Atelectasis versus infiltrate. 3. Patchy areas of ground-glass in both lungs. This has a differential including atypical or viral process. Recommend follow-up. 4. Cardiomegaly.  Postop chest. 5. Ascending aortic aneurysm measuring 4.3 x 4.0 cm. 6. Nodular contour to the liver. Please correlate for any known history of chronic liver disease. 7. Cholelithiasis. 8. 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 9. Moderate compression of the superior endplate of L4, not seen in 2017 per the margins are somewhat sclerotic. Favor a subacute to chronic process. Please correlate with history. 10. Aortic atherosclerosis. Aortic Atherosclerosis (ICD10-I70.0). Electronically Signed   By: Jill Side M.D.   On: 04/14/2022 14:04   DG Chest 2 View  Result Date: 04/14/2022 CLINICAL DATA:  Fall EXAM: CHEST - 2 VIEW COMPARISON:  Chest x-ray dated September 23, 2021 FINDINGS: Cardiac and mediastinal contours are unchanged post median sternotomy. Left-greater-than-right pleural effusions. No evidence of pneumothorax. Chronic appearing anterior second right rib fracture. IMPRESSION: Left-greater-than-right pleural effusions bibasilar atelectasis, similar to prior. Electronically Signed   By: Yetta Glassman M.D.   On: 04/14/2022 10:04    Barton Dubois, MD  [facility]  If 7PM-7AM, please contact night-coverage [email or web address] Password [email or web address] 04/19/2022, 6:11 PM   LOS: 5 days

## 2022-04-19 NOTE — TOC Progression Note (Signed)
Transition of Care [facility]) - Progression Note    Patient Details  Name: Mary [name] MRN: [id number] Date of Birth: October 15, 1940  Transition of Care [facility]) CM/SW Contact  Salome Arnt, [place] Phone Number: 04/19/2022, 3:09 PM  Clinical Narrative:  LCSW left voicemail for Juliann Pulse at [facility] updating her on anticipated d/c in 1-2 days. LCSW requested if facility needs to assess pt that they do this soon.      Expected Discharge Plan: Assisted Living Barriers to Discharge: Continued Medical Work up  Expected Discharge Plan and Services In-house Referral: Clinical Social Work Discharge Planning Services: CM Consult   Living arrangements for the past 2 months: [place]                                       Social Determinants of Health (SDOH) Interventions SDOH Screenings   Food Insecurity: No Food Insecurity (04/15/2022)  Housing: Low Risk  (04/15/2022)  Transportation Needs: No Transportation Needs (04/15/2022)  Utilities: Not At Risk (04/15/2022)  Tobacco Use: Low Risk  (04/14/2022)    Readmission Risk Interventions    04/16/2022   12:03 PM 12/19/2020    2:33 PM  Readmission Risk Prevention Plan  Transportation Screening Complete Complete  Medication Review Press photographer) Complete Complete  HRI or Home Care Consult Complete Complete  SW Recovery Care/Counseling Consult Complete Complete  Palliative Care Screening Not Applicable Not Applicable  Skilled Nursing Facility Complete Complete

## 2022-04-20 ENCOUNTER — Inpatient Hospital Stay (HOSPITAL_COMMUNITY): Payer: Medicare Other

## 2022-04-20 DIAGNOSIS — Z515 Encounter for palliative care: Secondary | ICD-10-CM | POA: Diagnosis not present

## 2022-04-20 DIAGNOSIS — D6869 Other thrombophilia: Secondary | ICD-10-CM | POA: Diagnosis not present

## 2022-04-20 DIAGNOSIS — A419 Sepsis, unspecified organism: Secondary | ICD-10-CM | POA: Diagnosis not present

## 2022-04-20 DIAGNOSIS — J690 Pneumonitis due to inhalation of food and vomit: Secondary | ICD-10-CM | POA: Diagnosis not present

## 2022-04-20 LAB — BPAM FFP
Blood Product Expiration Date: 202402042359
Blood Product Expiration Date: 202402042359
ISSUE DATE / TIME: 202401301358
Unit Type and Rh: 5100
Unit Type and Rh: 600

## 2022-04-20 LAB — CBC
HCT: 30.2 % — ABNORMAL LOW (ref 36.0–46.0)
Hemoglobin: 9.5 g/dL — ABNORMAL LOW (ref 12.0–15.0)
MCH: 31.9 pg (ref 26.0–34.0)
MCHC: 31.5 g/dL (ref 30.0–36.0)
MCV: 101.3 fL — ABNORMAL HIGH (ref 80.0–100.0)
Platelets: 109 10*3/uL — ABNORMAL LOW (ref 150–400)
RBC: 2.98 MIL/uL — ABNORMAL LOW (ref 3.87–5.11)
RDW: 18.1 % — ABNORMAL HIGH (ref 11.5–15.5)
WBC: 8.2 10*3/uL (ref 4.0–10.5)
nRBC: 1.8 % — ABNORMAL HIGH (ref 0.0–0.2)

## 2022-04-20 LAB — BASIC METABOLIC PANEL
Anion gap: 9 (ref 5–15)
BUN: 36 mg/dL — ABNORMAL HIGH (ref 8–23)
CO2: 25 mmol/L (ref 22–32)
Calcium: 8.4 mg/dL — ABNORMAL LOW (ref 8.9–10.3)
Chloride: 102 mmol/L (ref 98–111)
Creatinine, Ser: 1.39 mg/dL — ABNORMAL HIGH (ref 0.44–1.00)
GFR, Estimated: 37 mL/min — ABNORMAL LOW (ref >60)
Glucose, Bld: 116 mg/dL — ABNORMAL HIGH (ref 70–99)
Potassium: 3.6 mmol/L (ref 3.5–5.1)
Sodium: 136 mmol/L (ref 135–145)

## 2022-04-20 LAB — PREPARE FRESH FROZEN PLASMA

## 2022-04-20 NOTE — Progress Notes (Signed)
Pt eating breakfast with husband at bedside. Began coughing off of small bite of french toast and desatted to 65% on 2L. NRB placed, chest xray and breathing treatment ordered. Sats up in the 90s currently. Attending notified. Will continue to monitor.

## 2022-04-20 NOTE — Progress Notes (Signed)
SLP bedside at this time working with pt.

## 2022-04-20 NOTE — Progress Notes (Signed)
Respiratory at bedside.

## 2022-04-20 NOTE — Progress Notes (Signed)
PROGRESS NOTE  Mary [name] EHU:[id number] DOB: [birth year] DOA: 04/14/2022 PCP: Pcp, No  Brief History:  82 year old female with a history of dementia, hypertension, hyperlipidemia, CKD stage III, anxiety, mechanical aortic valve, stroke, diastolic CHF, and anxiety presenting from [facility] with generalized weakness for 3 to 4 days.  She has had associated decreased oral intake and nausea.  The patient is a difficult historian.  The patient's spouse is at the bedside.  He is also a difficult historian.  Nevertheless, it appears that the patient was treated for pneumonia.  Review of the [facility] from [place] shows the patient was started on levofloxacin on 04/08/2022 for 5-day course.  Even with the antibiotics, the patient continued to have progressive generalized weakness.  She denies any headache, neck pain, chest pain, hemoptysis, vomiting, diarrhea.  She does complain of abdominal pain.  She states that the abdominal pain has been present for 3 to 4 days.  There is no hematochezia or melena.  She has some dyspnea with exertion. In the ED, the patient was afebrile hemodynamically stable with oxygen saturation 99% on room air.  WBC 10.8, hemoglobin 9.3, platelets 302,000.  Sodium 135, potassium 4.4, bicarbonate 24, serum creatinine 1.77.  The patient was started on IV ceftriaxone and azithromycin and IV fluids.  She was admitted for further evaluation and treatment.   Assessment/Plan: SIRS -concerned about intra-abdominal source of infection initially -CT abd--- Large areas of presumed hematoma involving the rectus abdominis muscles bilaterally, left-greater-than-right. There is also large extraperitoneal hematoma along the margin of the iliopsoas muscle on the left. -presented with leukocytosis, tachycardia, elevated lactate -follow blood culture--neg to date -UA neg for pyuria -Lactic acid peaked 2.2 -Continue IV fluids>>saline locked -Treated with IV Zosyn and subsequently transition  to Augmentin on 04/18/2022; planning for 3 more days of antibiotics; given acute choking event with concern for reaspiration process. -Chest x-ray without focal consolidation -sepsis ruled out   Abdominal pain/extraperitoneal/retroperitoneal bleed/ Acute Blood Loss Anemia -CT abdomen and pelvis--- Large areas of presumed hematoma involving the rectus abdominismuscles bilaterally, left-greater-than-right. There is also largeextraperitoneal hematoma along the margin of the iliopsoas muscle on the left -Status post 8 units PRBC transfused throughout hospitalization. -Patient has also received fresh frozen plasma transfusion. -fentanyl prn pain -Continue to follow hemoglobin trend. -Hemoglobin 9.5 currently.   Supratherapeutic INR -Likely secondary to the patient's recent use of levofloxacin and decreased oral intake in the setting of warfarin use -Vitamin K 20 mg total given for the admission -9 units FFP given  total (last one ordered 1/30) -Continue Daily INR; currently subtherapeutic and stable.   Status post mechanical aortic valve replacement -Continue anticoagulation with INR goal of 2.5-3.5 -Holding warfarin temporarily secondary to supratherapeutic INR -Resume anticoagulation once hemoglobin stable.   Aspiration pneumonitis -initially started on zosyn -Successfully transitioned to amox/clav>> planning for 1 more days to finish abx therapy. -Continue the use of Mucinex, flutter valve and as needed bronchodilator.   Essential hypertension -Vital signs stable -Continue to follow blood pressure trend and resume antihypertensive agents as required.   Dementia without behavioral disturbance -At risk for hospital delirium -Continue constant reorientation.   Mixed hyperlipidemia -Resume the use of simvastatin at time of discharge. -Continue heart healthy diet.   Anxiety/depression -Continue BuSpar   Acute on chronic renal failure--CKD stage IV -Renal function has improved and  back to baseline at this moment (creatinine 1.39) -due to hemodynamic changes, IV contrast -Good response to Lasix given;  no crackles appreciated on examination. -Continue to monitor daily basic metabolic panel.   Thrombocytopenia -due to acute medical illness -Continue to follow platelet counts trend/stability.   Hypokalemia -In the setting of GI losses and decreased oral intake -Magnesium within normal limit -Continue electrolyte repletion and follow trend. -Repleted and within normal limits currently.  Diarrhea -C. difficile PCR negative -Most likely triggered by the use of antibiotics -Continue supportive care. -Continue treatment with Florastor and as needed Imodium.   Goals of Care -confirmed with patient, daughter, spouse>>DNR -Continue current goals of care. -Physical therapy evaluation will be requested -Overall prognosis and future recovery guarded.      Family Communication:   No further bedside.   Consultants:  none   Code Status:  DNR   DVT Prophylaxis:  SCDs     Procedures: As Listed in Progress Note Above   Antibiotics: Zosyn 1/26>>1/29 Amox/clav 1/29>> Azithro 1/26>>1/28   Subjective: No fever, no chest pain, no nausea or vomiting.  Acute respiratory distress while eating breakfast experiencing choking event.  Transient use of nonrebreather and subsequently 5 L nasal cannula supplementation.  Hemoglobin has remained stable.  Patient continues to have some loose stools.   Objective: Vitals:   04/20/22 0735 04/20/22 0800 04/20/22 0850 04/20/22 0900  BP:  (!) 140/56  (!) 154/65  Pulse: 99 91  (!) 108  Resp: (!) 29 (!) 22  (!) 31  Temp: 97.8 F (36.6 C)     TempSrc: Oral     SpO2: 95% 96% 98% 99%  Weight:      Height:        Intake/Output Summary (Last 24 hours) at 04/20/2022 1024 Last data filed at 04/20/2022 4332 Gross per 24 hour  Intake --  Output 1200 ml  Net -1200 ml   Weight change:   Exam: General exam: Alert, awake, oriented x  3; patient experience acute respiratory distress while eating breakfast requiring transient use of nonrebreather and now 5 L nasal cannula supplementation. Respiratory system: Positive rhonchi bilaterally; mild tachypnea appreciated.  No using accessory muscles. Cardiovascular system:RRR. No rubs or gallops; no JVD. Gastrointestinal system: Abdomen is nondistended, soft and with appreciated bowel sounds.  Still reporting mild discomfort on deep palpation.  Bruises/hematoma appreciated in her flank area. Central nervous system: Alert and oriented. No focal neurological deficits. Extremities: No cyanosis or clubbing. Skin: No rashes, lesions or ulcers Psychiatry: Judgement and insight appear normal. Mood & affect appropriate.   Data Reviewed: I have personally reviewed following labs and imaging studies  Basic Metabolic Panel: Recent Labs  Lab 04/16/22 0520 04/17/22 0416 04/18/22 0433 04/19/22 0421 04/20/22 0421  NA 133* 136 138 137 136  K 3.4* 3.0* 3.5 3.1* 3.6  CL 102 104 107 106 102  CO2 21* 23 21* 26 25  GLUCOSE 164* 129* 144* 92 116*  BUN 49* 52* 46* 40* 36*  CREATININE 2.34* 2.06* 1.80* 1.47* 1.39*  CALCIUM 6.8* 7.4* 8.2* 8.3* 8.4*  MG 2.1  --   --  2.2  --    Liver Function Tests: Recent Labs  Lab 04/14/22 1000 04/15/22 0455  AST 29 42*  ALT 13 18  ALKPHOS 89 59  BILITOT 1.7* 2.0*  PROT 7.5 5.7*  ALBUMIN 2.9* 2.4*   Coagulation Profile: Recent Labs  Lab 04/15/22 0455 04/16/22 0520 04/17/22 0416 04/18/22 0748 04/19/22 0421  INR 4.7* 2.3* 1.8* 1.7* 1.6*   CBC: Recent Labs  Lab 04/14/22 1000 04/14/22 1514 04/16/22 0520 04/16/22 1122 04/17/22 0416 04/17/22 0701  04/18/22 0706 04/19/22 0421 04/20/22 0421  WBC 11.8*   < > 13.7*  --  8.1  --  10.7* 8.4 8.2  NEUTROABS 9.8*  --   --   --   --   --   --   --   --   HGB 9.3*   < > 7.8*   < > 6.6* 6.9* 9.9* 9.4* 9.5*  HCT 29.6*   < > 22.7*   < > 19.7* 20.8* 29.6* 29.0* 30.2*  MCV 101.0*   < > 92.3  --  95.6   --  94.9 98.6 101.3*  PLT 302   < > 121*  --  101*  --  119* 110* 109*   < > = values in this interval not displayed.   Urine analysis:    Component Value Date/Time   COLORURINE YELLOW 04/14/2022 2200   APPEARANCEUR CLEAR 04/14/2022 2200   LABSPEC >1.046 (H) 04/14/2022 2200   PHURINE 5.0 04/14/2022 2200   GLUCOSEU NEGATIVE 04/14/2022 2200   HGBUR NEGATIVE 04/14/2022 2200   BILIRUBINUR NEGATIVE 04/14/2022 2200   KETONESUR NEGATIVE 04/14/2022 2200   PROTEINUR NEGATIVE 04/14/2022 2200   UROBILINOGEN 2.0 (H) 11/01/2007 1518   NITRITE NEGATIVE 04/14/2022 2200   LEUKOCYTESUR NEGATIVE 04/14/2022 2200   Sepsis Labs:  Recent Results (from the past 240 hour(s))  Blood culture (routine x 2)     Status: None   Collection Time: 04/14/22 10:30 AM   Specimen: BLOOD  Result Value Ref Range Status   Specimen Description   Final    BLOOD LEFT ASSIST CONTROL Performed at [facility], [address] [place], [place] [address]    Special Requests   Final    BOTTLES DRAWN AEROBIC AND ANAEROBIC Blood Culture adequate volume Performed at [facility], [address] [place], [place] [address]    Culture   Final    NO GROWTH 5 DAYS Performed at [facility], [place] [address] [place], [place] [address]    Report Status 04/19/2022 FINAL  Final  Blood culture (routine x 2)     Status: None   Collection Time: 04/14/22 10:42 AM   Specimen: BLOOD  Result Value Ref Range Status   Specimen Description   Final    BLOOD RIGHT ASSIST CONTROL Performed at [facility], [address] [place], [place] [address]    Special Requests   Final    BOTTLES DRAWN AEROBIC AND ANAEROBIC Blood Culture adequate volume Performed at [facility], [address] [place], [place] [address]    Culture   Final    NO GROWTH 5 DAYS Performed at [facility], [place] [address], [place],  [address]    Report Status 04/19/2022 FINAL  Final  Resp panel by RT-PCR (RSV, Flu A&B, Covid) Anterior Nasal Swab      Status: None   Collection Time: 04/14/22  6:00 PM   Specimen: Anterior Nasal Swab  Result Value Ref Range Status   SARS Coronavirus 2 by RT PCR NEGATIVE NEGATIVE Final    Comment: (NOTE) SARS-CoV-2 target nucleic acids are NOT DETECTED.  The SARS-CoV-2 RNA is generally detectable in upper respiratory specimens during the acute phase of infection. The lowest concentration of SARS-CoV-2 viral copies this assay can detect is 138 copies/mL. A negative result does not preclude SARS-Cov-2 infection and should not be used as the sole basis for treatment or other patient management decisions. A negative result may occur with  improper specimen collection/handling, submission of  specimen other than nasopharyngeal swab, presence of viral mutation(s) within the areas targeted by this assay, and inadequate number of viral copies(<138 copies/mL). A negative result must be combined with clinical observations, patient history, and epidemiological information. The expected result is Negative.  Fact Sheet for Patients:  EntrepreneurPulse.com.au  Fact Sheet for Healthcare Providers:  IncredibleEmployment.be  This test is no t yet approved or cleared by the [place] FDA and  has been authorized for detection and/or diagnosis of SARS-CoV-2 by FDA under an Emergency Use Authorization (EUA). This EUA will remain  in effect (meaning this test can be used) for the duration of the COVID-19 declaration under Section 564(b)(1) of the Act, 21 U.S.C.section 360bbb-3(b)(1), unless the authorization is terminated  or revoked sooner.       Influenza A by PCR NEGATIVE NEGATIVE Final   Influenza B by PCR NEGATIVE NEGATIVE Final    Comment: (NOTE) The Xpert Xpress SARS-CoV-2/FLU/RSV plus assay is intended as an aid in the diagnosis of influenza from Nasopharyngeal swab specimens and should not be used as a sole basis for treatment. Nasal washings and aspirates are  unacceptable for Xpert Xpress SARS-CoV-2/FLU/RSV testing.  Fact Sheet for Patients: EntrepreneurPulse.com.au  Fact Sheet for Healthcare Providers: IncredibleEmployment.be  This test is not yet approved or cleared by the [place] FDA and has been authorized for detection and/or diagnosis of SARS-CoV-2 by FDA under an Emergency Use Authorization (EUA). This EUA will remain in effect (meaning this test can be used) for the duration of the COVID-19 declaration under Section 564(b)(1) of the Act, 21 U.S.C. section 360bbb-3(b)(1), unless the authorization is terminated or revoked.     Resp Syncytial Virus by PCR NEGATIVE NEGATIVE Final    Comment: (NOTE) Fact Sheet for Patients: EntrepreneurPulse.com.au  Fact Sheet for Healthcare Providers: IncredibleEmployment.be  This test is not yet approved or cleared by the [place] FDA and has been authorized for detection and/or diagnosis of SARS-CoV-2 by FDA under an Emergency Use Authorization (EUA). This EUA will remain in effect (meaning this test can be used) for the duration of the COVID-19 declaration under Section 564(b)(1) of the Act, 21 U.S.C. section 360bbb-3(b)(1), unless the authorization is terminated or revoked.  Performed at [facility], [address] [place], [place] [address]   MRSA Next Gen by PCR, Nasal     Status: None   Collection Time: 04/14/22  6:53 PM   Specimen: Nasal Mucosa; Nasal Swab  Result Value Ref Range Status   MRSA by PCR Next Gen NOT DETECTED NOT DETECTED Final    Comment: (NOTE) The GeneXpert MRSA Assay (FDA approved for NASAL specimens only), is one component of a comprehensive MRSA colonization surveillance program. It is not intended to diagnose MRSA infection nor to guide or monitor treatment for MRSA infections. Test performance is not FDA approved in patients less than 4 years old. Performed at [facility], [address] [place], [place] [address]   C Difficile Quick Screen (NO PCR Reflex)     Status: None   Collection Time: 04/18/22  4:52 AM   Specimen: STOOL  Result Value Ref Range Status   C Diff antigen NEGATIVE NEGATIVE Final   C Diff toxin NEGATIVE NEGATIVE Final   C Diff interpretation No C. difficile detected.  Final    Comment: Performed at [facility], [address], [place], [place] [address]     Scheduled Meds:  amoxicillin-clavulanate  1 tablet Oral BID   buPROPion  150 mg Oral q AM  busPIRone  10 mg Oral BID   Chlorhexidine Gluconate Cloth  6 each Topical Daily   dextromethorphan-guaiFENesin  1 tablet Oral BID   pantoprazole (PROTONIX) IV  40 mg Intravenous Q12H   saccharomyces boulardii  250 mg Oral BID   simvastatin  40 mg Oral q1800   sodium bicarbonate  650 mg Oral TID   sodium chloride flush  10-40 mL Intracatheter Q12H   Continuous Infusions:  Procedures/Studies: DG CHEST PORT 1 VIEW  Result Date: 04/20/2022 CLINICAL DATA:  Aspiration. EXAM: PORTABLE CHEST 1 VIEW COMPARISON:  04/14/2022 FINDINGS: Right arm PICC line tip terminates in the distal SVC. Previous median sternotomy. Stable mild cardiac enlargement. Persistent small bilateral pleural effusions, right greater than left. New bilateral lower lobe zone peribronchovascular opacities are identified along with scattered areas of subsegmental atelectasis. IMPRESSION: 1. New bilateral lower lobe peribronchovascular opacities compatible with aspiration. 2. Persistent small bilateral pleural effusions, right greater than left. Electronically Signed   By: Kerby Moors M.D.   On: 04/20/2022 09:44   [place] EKG SITE RITE  Result Date: 04/15/2022 If Site Rite image not attached, placement could not be confirmed due to current cardiac rhythm.  CT Angio Abd/Pel w/ and/or w/o  Result Date: 04/14/2022 CLINICAL DATA:  Retroperitoneal bleed suspected, extraperitoneal bleed. EXAM: CTA ABDOMEN AND PELVIS WITHOUT AND WITH  CONTRAST TECHNIQUE: Multidetector CT imaging of the abdomen and pelvis was performed using the standard protocol during bolus administration of intravenous contrast. Multiplanar reconstructed images and MIPs were obtained and reviewed to evaluate the vascular anatomy. RADIATION DOSE REDUCTION: This exam was performed according to the departmental dose-optimization program which includes automated exposure control, adjustment of the mA and/or kV according to patient size and/or use of iterative reconstruction technique. CONTRAST:  40mL OMNIPAQUE IOHEXOL 350 MG/ML SOLN COMPARISON:  Noncontrast CT earlier today. FINDINGS: VASCULAR Aorta: Moderate atherosclerosis. No aneurysm, dissection or acute findings. Celiac: Calcified plaque at the origin causes mild stenosis. Distal branch vessels are patent. No aneurysm, dissection or acute findings. SMA: Mild diffuse atheromatous plaque with mild areas of stenosis. No aneurysm or acute findings. Renals: Mild plaque in the proximal renal arteries causing varying degrees of stenosis up to moderate. No aneurysm or acute findings. IMA: Patent. Inflow: Mild atherosclerosis without acute findings. Proximal Outflow: Bilateral common femoral and visualized portions of the superficial and profunda femoral arteries are patent without evidence of aneurysm, dissection, vasculitis or significant stenosis. Retroperitoneal and extraperitoneal hematomas. There is no active arterial extravasation within any of the retroperitoneal or extraperitoneal bleed. Veins: Venous phase imaging demonstrates patency of the iliac veins and IVC. Portal, splenic and mesenteric veins are patent. No areas of active extravasation or seen on venous phase. Review of the MIP images confirms the above findings. NON-VASCULAR Lower chest: Right pleural effusion with pleural enhancement, unchanged from earlier today. Hepatobiliary: Nodular hepatic contours typical of cirrhosis. Water density there is a 13 mm low-density  lesion in the left lobe of the liver series 12, image 21, measuring slightly higher than simple fluid density, nonspecific. Multiple gallstones without evidence of gallbladder inflammation. Pancreas: Parenchymal atrophy. No ductal dilatation or inflammation. Spleen: No splenomegaly. Adrenals/Urinary Tract: No adrenal nodule or hemorrhage. Bilateral renal parenchymal atrophy with multifocal areas of parenchymal scarring. Parenchymal calcifications in the upper pole the right kidney. No hydronephrosis. Displaced to the right due to left pelvic fluid collections. The bladder appears thick walled which is nonspecific. Stomach/Bowel: No bowel obstruction or inflammation. There is mild colonic distension with stool proximally and air distally.  The appendix is not seen. Lymphatic: No gross adenopathy. Reproductive: Uterine vascular calcifications again seen. Other: Again seen multiple areas of presume extraperitoneal and retroperitoneal hematoma/hemorrhage. Largest area is in the left pelvis measuring approximately 12.9 x 7 cm with hematocrit level, measured on series 12, image 78. This may be contiguous with the left rectus abdominus musculature. More superiorly is an area of presumed rectus hematoma measuring 6.1 x 5.1 cm, measured on series 12, image 67. There is stranding in the left retroperitoneum tracking anterior to the iliopsoas muscle that is primarily ill-defined. Stranding within the right extraperitoneal M with ill-defined hemorrhage. Fluid collections within the upper abdominal wall musculature just underneath the anterior ribs, 8.7 x 3.3 cm on the left series 12, image 30, 4.0 x 2.7 cm on the right series 12, image 31. There is no evidence of active extravasation within any of these hematomas. There is edema within the subcutaneous tissues of the left flank/abdominal wall as well as anterior to the lower abdominal wall musculature. Musculoskeletal: Unchanged from earlier today with L4 compression deformity.  Left hip arthroplasty. IMPRESSION: 1. No evidence of active extravasation within any of the retroperitoneal or extraperitoneal fluid collections/presumed hematoma. 2. Multiple areas of presume extraperitoneal and retroperitoneal hematoma/hemorrhage, largest area in the left pelvis measuring 12.9 x 7 cm, likely contiguous with the left rectus abdominus musculature. Allowing for differences in caliper placement in technique, no significant change in size of these collections from earlier today. Possibility of infection is considered but felt less likely. 3. Cirrhosis. Indeterminate 13 mm low-density lesion in the left lobe of the liver measuring slightly higher than simple fluid density. Recommend further evaluation with MRI on a nonemergent basis after resolution of acute event. 4. Cholelithiasis without acute cholecystitis. 5. Aortic and branch atherosclerosis with areas of arterial stenosis up to moderate in degree, as described. 6. Additional chronic changes are stable from earlier today. Aortic Atherosclerosis (ICD10-I70.0). Electronically Signed   By: Keith Rake M.D.   On: 04/14/2022 16:41   CT CHEST ABDOMEN PELVIS WO CONTRAST  Result Date: 04/14/2022 CLINICAL DATA:  Generalized pain.  Sepsis.  Nausea. EXAM: CT CHEST, ABDOMEN AND PELVIS WITHOUT CONTRAST TECHNIQUE: Multidetector CT imaging of the chest, abdomen and pelvis was performed following the standard protocol without IV contrast. RADIATION DOSE REDUCTION: This exam was performed according to the departmental dose-optimization program which includes automated exposure control, adjustment of the mA and/or kV according to patient size and/or use of iterative reconstruction technique. COMPARISON:  Chest x-ray earlier 04/14/2022. Older x-rays. CT scan abdomen pelvis 2017 July FINDINGS: CT CHEST FINDINGS Cardiovascular: Heart is enlarged. Trace pericardial fluid. Prominent calcifications along the mitral valve. Postop chest with prosthetic aortic  valve. The thoracic aorta has scattered calcified atherosclerotic plaque. Diameter of the ascending aorta at the level of the right pulmonary artery measures 4.3 by 4.0 cm. Mediastinum/Nodes: Small thyroid gland. No specific abnormal lymph node enlargement present in the axillary region, hila or mediastinum on this noncontrast exam. Prominent subcarinal node is noted measuring 17 by 10 mm on series 2, image 30. Normal caliber thoracic esophagus. Lungs/Pleura: Diffuse breathing motion. Small right-sided pleural effusion identified with some adjacent parenchymal opacities. Associated pleural thickening. This effusion was seen in 2017 and with slightly larger that time. More thickening today of the pleura. Atelectasis versus infiltrate. There is some bandlike areas of opacity as well in the middle lobe. Scattered areas of interstitial septal thickening are identified. There are few patchy ground-glass areas as well in the right upper  lobe towards the apex such as series 3, image 42, series 3, image 53. Additional areas scattered in the left upper lobe, middle lobe and superior segment of left lower lobe. This has a differential including atypical or viral process. Recommend follow-up. Musculoskeletal: Osteopenia. Scattered degenerative changes noted along the spine. Of note the patient's arms were scanned at the patient's side. CT ABDOMEN PELVIS FINDINGS Hepatobiliary: Nodular contour to the liver. Please correlate for any known history of chronic liver disease. There are multiple stones in the nondilated gallbladder. Pancreas: Moderate pancreatic atrophy. No obvious mass. Appearance is similar to prior. Spleen: Spleen is grossly nonenlarged. Adrenals/Urinary Tract: Adrenal glands are grossly preserved. There is mild bilateral renal atrophy with some focal areas along the midportion left kidney laterally. Some calcifications noted along the hilum of the right kidney which very well could be vascular. No definite  ureteral or renal stones otherwise. The bladder is somewhat decompressed with wall thickening but there is mass effect along the bladder from the adjacent fluid collections. Stomach/Bowel: Stomach is mildly distended with fluid. The small bowel is nondilated. Large bowel is nondilated prominent right-sided colonic stool. Again evaluation is limited by significant motion. Vascular/Lymphatic: Diffuse vascular calcifications are identified with areas of significant stenosis suggested along branch vessels including SMA, renal arteries. Normal caliber IVC. Reproductive: Scattered calcifications along the uterus. These could be vascular. No separate adnexal mass. Other: Significant motion noted limited evaluation. Extensive areas of presumed hematoma identified. These include the rectus abdominis muscles bilaterally, left-greater-than-right. The left hematoma extends along the course of the entirety of the rectus abdominis muscle, cephalocaudal length approaching 29 cm. Transverse dimension at the level of the pelvis approximate 7.4 by 4.8 cm. Again the right-sided areas are smaller and are along the lower chest and upper abdomen regions. There is also a large extraperitoneal hematoma more on the left than on the right with mass effect along surrounding structures including the bladder. This extends along the margin of the iliacus muscle. Longitudinal length on coronal image 89 of series 5 measures a proximally 18.2 cm. Axial dimension maximally approaches 10.5 by 5.5 cm. Anasarca. Musculoskeletal: Streak artifact related to patient's left hip arthroplasty obscuring the surrounding tissues. Curvature and degenerative changes seen along the spine. Osteopenia. Multilevel stenosis identified along the lumbar spine. There is moderate compression of the superior endplate of L4 which was not seen in 2017 per the margins are somewhat sclerotic. Favor a subacute to chronic process. Please correlate with history. There is  multilevel disc bulging. Degenerative changes seen along the pelvis. Critical Value/emergent results were called by telephone at the time of interpretation on 04/14/2022 at 1:59 pm to provider DAVID TAT , who verbally acknowledged these results. IMPRESSION: 1. Large areas of presumed hematoma involving the rectus abdominis muscles bilaterally, left-greater-than-right. There is also large extraperitoneal hematoma along the margin of the iliopsoas muscle on the left. This extends along the margin of the bladder with mass effect. 2. Small right-sided pleural effusion with adjacent parenchymal opacities. This effusion was seen in 2017 and with slightly larger that time. More thickening of the pleura today. Atelectasis versus infiltrate. 3. Patchy areas of ground-glass in both lungs. This has a differential including atypical or viral process. Recommend follow-up. 4. Cardiomegaly.  Postop chest. 5. Ascending aortic aneurysm measuring 4.3 x 4.0 cm. 6. Nodular contour to the liver. Please correlate for any known history of chronic liver disease. 7. Cholelithiasis. 8. Diffuse vascular calcifications with areas of significant stenosis along branch vessels including SMA, renal  arteries. 9. Moderate compression of the superior endplate of L4, not seen in 2017 per the margins are somewhat sclerotic. Favor a subacute to chronic process. Please correlate with history. 10. Aortic atherosclerosis. Aortic Atherosclerosis (ICD10-I70.0). Electronically Signed   By: Jill Side M.D.   On: 04/14/2022 14:04   DG Chest 2 View  Result Date: 04/14/2022 CLINICAL DATA:  Fall EXAM: CHEST - 2 VIEW COMPARISON:  Chest x-ray dated September 23, 2021 FINDINGS: Cardiac and mediastinal contours are unchanged post median sternotomy. Left-greater-than-right pleural effusions. No evidence of pneumothorax. Chronic appearing anterior second right rib fracture. IMPRESSION: Left-greater-than-right pleural effusions bibasilar atelectasis, similar to prior.  Electronically Signed   By: Yetta Glassman M.D.   On: 04/14/2022 10:04    Barton Dubois, MD  [facility]  If 7PM-7AM, please contact night-coverage [email or web address] Password [email or web address] 04/20/2022, 10:24 AM   LOS: 6 days

## 2022-04-20 NOTE — Evaluation (Signed)
Clinical/Bedside Swallow Evaluation Patient Details  Name: Mary [name] MRN: [id number] Date of Birth: [birth year]  Today's Date: 04/20/2022 Time: SLP Start Time (ACUTE ONLY): 84 SLP Stop Time (ACUTE ONLY): 80 SLP Time Calculation (min) (ACUTE ONLY): 23 min  Past Medical History:  Past Medical History:  Diagnosis Date   Anemia in CKD (chronic kidney disease)    Anxiety    Dementia with behavioral disturbance (HCC)    Diabetes mellitus with neuropathy (HCC)    Diabetic neuropathy (HCC)    DJD (degenerative joint disease)    GERD (gastroesophageal reflux disease)    H/O aortic valve replacement    Hyperlipidemia    Hypertension    Neuromuscular disorder (HCC)    neuropathy in feet   Obesity    Sleep apnea    Stroke ([place])    " light stroke"   Vitamin D deficiency    Wears glasses    Past Surgical History:  Past Surgical History:  Procedure Laterality Date   ANKLE SURGERY     Left tendon repair   ANTERIOR APPROACH HEMI HIP ARTHROPLASTY Left 08/13/2020   Procedure: ANTERIOR APPROACH HEMI HIP ARTHROPLASTY;  Surgeon: Leandrew Koyanagi, MD;  Location: [place];  Service: Orthopedics;  Laterality: Left;   AORTIC VALVE REPLACEMENT  03/1994   CARDIAC CATHETERIZATION     1996 [facility]   CERVICAL SPINE SURGERY     COLONOSCOPY     HEMORRHOID SURGERY     IR PARACENTESIS  10/31/2019   KNEE ARTHROSCOPY Right 04/13/2015   Procedure: RIGHT KNEE ARTHROSCOPY WITH DEBRIDEMENT AND PARTIAL MEDIAL MENISCECTOMY;  Surgeon: Mcarthur Rossetti, MD;  Location: [place];  Service: Orthopedics;  Laterality: Right;   KNEE ARTHROSCOPY W/ MENISCECTOMY Right 04/13/2015   LUMBAR FUSION     RIGHT HEART CATH N/A 10/28/2019   Procedure: RIGHT HEART CATH;  Surgeon: Leonie Man, MD;  Location: [facility];  Service: Cardiovascular;  Laterality: N/A;   HPI:  82 year old female with a history of dementia, hypertension, hyperlipidemia, CKD stage III, anxiety, mechanical aortic valve, stroke, diastolic  CHF, and anxiety presenting from [facility] with generalized weakness for 3 to 4 days.  She has had associated decreased oral intake and nausea.  The patient is a difficult historian.  The patient's spouse is at the bedside.  He is also a difficult historian.  Nevertheless, it appears that the patient was treated for pneumonia.  Review of the [facility] from [place] shows the patient was started on levofloxacin on 04/08/2022 for 5-day course.  Even with the antibiotics, the patient continued to have progressive generalized weakness.  She denies any headache, neck pain, chest pain, hemoptysis, vomiting, diarrhea.  She does complain of abdominal pain.  She states that the abdominal pain has been present for 3 to 4 days.  There is no hematochezia or melena.  She has some dyspnea with exertion.  In the ED, the patient was afebrile hemodynamically stable with oxygen saturation 99% on room air.  WBC 10.8, hemoglobin 9.3, platelets 302,000.  Sodium 135, potassium 4.4, bicarbonate 24, serum creatinine 1.77.  The patient was started on IV ceftriaxone and azithromycin and IV fluids.  She was admitted for further evaluation and treatment. On 04/20/22 Acute respiratory distress while eating breakfast experiencing choking event.  Transient use of nonrebreather and subsequently 5 L nasal cannula supplementation. BSE requested.    Assessment / Plan / Recommendation  Clinical Impression  Clinical swallow evaluation completed at bedside. Oral motor examination is grossly WNL,  Pt with mild dysarthria but suspect due to fatigue, weak congested cough. Pt assessed with ice chips,  thin water via tsp/cup/straw, NTL via cup/straw, and puree. Pt with reduced labial seal with cup sips thin liquid which resulted in labial spillage and immediate cough (suspect due to premature spillage over base of tongue). Pt with improved performance with straw sips thin and also with NTL via cup/straw. Regular textures were not presented this date due to  fatigue and earlier "choking" episode with french toast. Recommend D1/puree and NTL via cup or straw and PO Medications whole with NTL or in puree and SLP to reassess tomorrow. Above to RN. SLP Visit Diagnosis: Dysphagia, unspecified (R13.10)    Aspiration Risk  Mild aspiration risk    Diet Recommendation Dysphagia 1 (Puree);Nectar-thick liquid   Liquid Administration via: Cup;Straw Medication Administration: Whole meds with liquid Supervision: Staff to assist with self feeding;Full supervision/cueing for compensatory strategies Compensations: Slow rate;Small sips/bites Postural Changes: Seated upright at 90 degrees;Remain upright for at least 30 minutes after po intake    Other  Recommendations Oral Care Recommendations: Oral care BID;Staff/trained caregiver to provide oral care Other Recommendations: Clarify dietary restrictions;Order thickener from pharmacy;Prohibited food (jello, ice cream, thin soups)    Recommendations for follow up therapy are one component of a multi-disciplinary discharge planning process, led by the attending physician.  Recommendations may be updated based on patient status, additional functional criteria and insurance authorization.  Follow up Recommendations Follow physician's recommendations for discharge plan and follow up therapies      Assistance Recommended at Discharge    Functional Status Assessment Patient has had a recent decline in their functional status and demonstrates the ability to make significant improvements in function in a reasonable and predictable amount of time.  Frequency and Duration min 2x/week  1 week       Prognosis Prognosis for Safe Diet Advancement: Good      Swallow Study   General Date of Onset: 04/20/22 HPI: 82 year old female with a history of dementia, hypertension, hyperlipidemia, CKD stage III, anxiety, mechanical aortic valve, stroke, diastolic CHF, and anxiety presenting from [facility] with generalized  weakness for 3 to 4 days.  She has had associated decreased oral intake and nausea.  The patient is a difficult historian.  The patient's spouse is at the bedside.  He is also a difficult historian.  Nevertheless, it appears that the patient was treated for pneumonia.  Review of the [facility] from [place] shows the patient was started on levofloxacin on 04/08/2022 for 5-day course.  Even with the antibiotics, the patient continued to have progressive generalized weakness.  She denies any headache, neck pain, chest pain, hemoptysis, vomiting, diarrhea.  She does complain of abdominal pain.  She states that the abdominal pain has been present for 3 to 4 days.  There is no hematochezia or melena.  She has some dyspnea with exertion.  In the ED, the patient was afebrile hemodynamically stable with oxygen saturation 99% on room air.  WBC 10.8, hemoglobin 9.3, platelets 302,000.  Sodium 135, potassium 4.4, bicarbonate 24, serum creatinine 1.77.  The patient was started on IV ceftriaxone and azithromycin and IV fluids.  She was admitted for further evaluation and treatment. On 04/20/22 Acute respiratory distress while eating breakfast experiencing choking event.  Transient use of nonrebreather and subsequently 5 L nasal cannula supplementation. BSE requested. Type of Study: Bedside Swallow Evaluation Diet Prior to this Study: NPO Temperature Spikes Noted: No Respiratory Status: Nasal cannula History of Recent  Intubation: No Behavior/Cognition: Alert;Cooperative;Pleasant mood Oral Cavity Assessment: Within Functional Limits Oral Care Completed by SLP: Yes Oral Cavity - Dentition: Adequate natural dentition Vision: Functional for self-feeding Self-Feeding Abilities: Total assist Patient Positioning: Upright in bed Baseline Vocal Quality: Normal;Low vocal intensity Volitional Cough: Weak Volitional Swallow: Able to elicit    Oral/Motor/Sensory Function Overall Oral Motor/Sensory Function: Within functional limits    Ice Chips Ice chips: Within functional limits Presentation: Spoon   Thin Liquid Thin Liquid: Impaired Presentation: Cup;Spoon;Straw Oral Phase Impairments: Reduced labial seal Pharyngeal  Phase Impairments: Cough - Immediate (reduced labial seal with cup sips, coughing with cup sips)    Nectar Thick Nectar Thick Liquid: Within functional limits Presentation: Cup;Straw   Honey Thick Honey Thick Liquid: Not tested   Puree Puree: Within functional limits Presentation: Spoon   Solid     Solid: Not tested     Thank you,  Genene Churn, [place]  Libby Goehring 04/20/2022,2:09 PM

## 2022-04-21 ENCOUNTER — Inpatient Hospital Stay (HOSPITAL_COMMUNITY): Payer: Medicare Other

## 2022-04-21 DIAGNOSIS — J690 Pneumonitis due to inhalation of food and vomit: Secondary | ICD-10-CM | POA: Diagnosis not present

## 2022-04-21 DIAGNOSIS — Z515 Encounter for palliative care: Secondary | ICD-10-CM | POA: Diagnosis not present

## 2022-04-21 DIAGNOSIS — A419 Sepsis, unspecified organism: Secondary | ICD-10-CM | POA: Diagnosis not present

## 2022-04-21 DIAGNOSIS — D6869 Other thrombophilia: Secondary | ICD-10-CM | POA: Diagnosis not present

## 2022-04-21 LAB — HEPARIN LEVEL (UNFRACTIONATED): Heparin Unfractionated: <0.1 IU/mL — ABNORMAL LOW (ref 0.30–0.70)

## 2022-04-21 MED ORDER — HEPARIN (PORCINE) 25000 UT/250ML-% IV SOLN
1450.0000 [IU]/h | INTRAVENOUS | Status: DC
  Administered 2022-04-21: 1100 [IU]/h via INTRAVENOUS
  Administered 2022-04-22: 1250 [IU]/h via INTRAVENOUS
  Administered 2022-04-23 – 2022-04-24 (×3): 1300 [IU]/h via INTRAVENOUS
  Filled 2022-04-21 (×5): qty 250

## 2022-04-21 MED ORDER — FUROSEMIDE 10 MG/ML IJ SOLN
20.0000 mg | Freq: Once | INTRAMUSCULAR | Status: AC
  Administered 2022-04-21: 20 mg via INTRAVENOUS
  Filled 2022-04-21: qty 2

## 2022-04-21 MED ORDER — HEPARIN (PORCINE) 25000 UT/250ML-% IV SOLN
900.0000 [IU]/h | INTRAVENOUS | Status: DC
  Administered 2022-04-21: 900 [IU]/h via INTRAVENOUS
  Filled 2022-04-21: qty 250

## 2022-04-21 NOTE — Progress Notes (Signed)
ANTICOAGULATION CONSULT NOTE - Initial Consult  Pharmacy Consult for Heparin Indication: mechanical aortic valve   Allergies  Allergen Reactions   Tape Itching    Redness, Please use "paper" tape only    Patient Measurements: Height: 5\' 7"  (170.2 cm) Weight: 66.5 kg (146 lb 9.7 oz) IBW/kg (Calculated) : 61.6 HEPARIN DW (KG): 63.7   Vital Signs: Temp: 97.9 F (36.6 C) (02/02 0320) Temp Source: Oral (02/02 0320) BP: 132/55 (02/02 0400) Pulse Rate: 91 (02/02 0400)  Labs: Recent Labs    04/19/22 0421 04/20/22 0421  HGB 9.4* 9.5*  HCT 29.0* 30.2*  PLT 110* 109*  LABPROT 19.1*  --   INR 1.6*  --   CREATININE 1.47* 1.39*    Estimated Creatinine Clearance: 29.3 mL/min (A) (by C-G formula based on SCr of 1.39 mg/dL (H)).   Medical History: Past Medical History:  Diagnosis Date   Anemia in CKD (chronic kidney disease)    Anxiety    Dementia with behavioral disturbance ([place])    Diabetes mellitus with neuropathy (HCC)    Diabetic neuropathy (HCC)    DJD (degenerative joint disease)    GERD (gastroesophageal reflux disease)    H/O aortic valve replacement    Hyperlipidemia    Hypertension    Neuromuscular disorder (HCC)    neuropathy in feet   Obesity    Sleep apnea    Stroke ([place])    " light stroke"   Vitamin D deficiency    Wears glasses     Medications:  Medications Prior to Admission  Medication Sig Dispense Refill Last Dose   ALPRAZolam (XANAX) 0.5 MG tablet Take 0.5 mg by mouth every 8 (eight) hours as needed for anxiety (agitation).   04/11/2022   buPROPion (WELLBUTRIN XL) 150 MG 24 hr tablet Take 150 mg by mouth in the morning.   04/13/2022   busPIRone (BUSPAR) 10 MG tablet Take 10 mg by mouth 2 (two) times daily.   04/13/2022   docusate sodium (COLACE) 100 MG capsule Take 1 capsule (100 mg total) by mouth 2 (two) times daily. 10 capsule 0 04/13/2022   ferrous sulfate 325 (65 FE) MG tablet Take 325 mg by mouth every other day.   04/12/2022   guaifenesin  (HUMIBID E) 400 MG TABS tablet Take 400 mg by mouth every 8 (eight) hours as needed (cough/congestion).   04/13/2022   HYDROcodone-acetaminophen (NORCO) 10-325 MG tablet Take 1 tablet by mouth every 6 (six) hours as needed for severe pain. (Patient taking differently: Take 1 tablet by mouth in the morning and at bedtime.) 30 tablet 0 04/13/2022   Multiple Vitamins-Minerals (MULTIVITAMIN WITH MINERALS) tablet Take 1 tablet by mouth in the morning.   04/13/2022   pantoprazole (PROTONIX) 40 MG tablet Take 1 tablet (40 mg total) by mouth daily before breakfast. 30 tablet 5 04/14/2022   polyethylene glycol (MIRALAX / GLYCOLAX) 17 g packet Take 17 g by mouth daily.   04/13/2022   QUEtiapine (SEROQUEL) 25 MG tablet Take 25 mg by mouth at bedtime.   04/13/2022   simvastatin (ZOCOR) 40 MG tablet Take 40 mg by mouth at bedtime.   04/13/2022   sodium bicarbonate 650 MG tablet Take 650 mg by mouth 3 (three) times daily.   04/13/2022   spironolactone (ALDACTONE) 50 MG tablet Take 50 mg by mouth in the morning.   04/13/2022   torsemide (DEMADEX) 20 MG tablet Take 20 mg by mouth 2 (two) times daily.   04/13/2022   warfarin (COUMADIN) 3  MG tablet Take 1 tablet (3 mg total) by mouth daily.   04/13/2022 at 2000   amoxicillin (AMOXIL) 500 MG capsule Take 2,000 mg by mouth See admin instructions. Take 2000mg  by mouth once daily for 1 day one hour pre-op.      spironolactone (ALDACTONE) 100 MG tablet Take 0.5 tablets (50 mg total) by mouth daily. (Patient not taking: Reported on 04/14/2022)   Not Taking    Assessment: 82 year old female with a history of dementia, hypertension, hyperlipidemia, CKD stage III, anxiety, mechanical aortic valve, stroke, diastolic CHF, and anxiety presenting from [facility] with generalized weakness for 3 to 4 days . Patient had Abdominal pan and CT abdomen showed large areas of presumed hematoma. Patient with extraperitoneal and retroperitoneal bleed. Now stabilized and MD wants to restart  anticoagulation conservatively with Heparin d/t mechanical AV. MD wanted HL goal of 0.3-0.5 with no boluses.   Goal of Therapy:  Heparin level 0.3-0.5 units/ml Monitor platelets by anticoagulation protocol: Yes   Plan:  Start heparin infusion at 900 units/hr Check anti-Xa level in ~8 hours and daily while on heparin Continue to monitor H&H and platelets F/U restart coumadin  Isac Sarna, BS Pharm D, BCPS Clinical Pharmacist 04/21/2022,10:36 AM

## 2022-04-21 NOTE — Evaluation (Signed)
Physical Therapy Evaluation Patient Details Name: Mary [name] MRN: [id number] DOB: [birth year] Today's Date: 04/21/2022  History of Present Illness  Mary [name] is a 82 year old female with a history of dementia, hypertension, hyperlipidemia, CKD stage III, anxiety, mechanical aortic valve, stroke, diastolic CHF, and anxiety presenting from [facility] with generalized weakness for 3 to 4 days.  She has had associated decreased oral intake and nausea.  The patient is a difficult historian.  The patient's spouse is at the bedside.  He is also a difficult historian.  Nevertheless, it appears that the patient was treated for pneumonia.  Review of the [facility] from [place] shows the patient was started on levofloxacin on 04/08/2022 for 5-day course.  Even with the antibiotics, the patient continued to have progressive generalized weakness.  She denies any headache, neck pain, chest pain, hemoptysis, vomiting, diarrhea.  She does complain of abdominal pain.  She states that the abdominal pain has been present for 3 to 4 days.  There is no hematochezia or melena.  She has some dyspnea with exertion.   Clinical Impression  Patient demonstrates slow labored movement for sitting up at bedside, frequent leaning backwards once seated requiring verbal/tactile cueing to limit posterior leaning, very unsteady on feet and limited to a few side steps before having to sit due to weakness/fall risk.  Patient incontinent of stool during after sitting up at bedside and tolerated sitting up in chair after therapy.  Patient SpO2 started at 100% on room air, but desaturated to 85% with exertion and put back on 2 LPM O2 - RN ware.  Patient will benefit from continued skilled physical therapy in hospital and recommended venue below to increase strength, balance, endurance for safe ADLs and gait.         Recommendations for follow up therapy are one component of a multi-disciplinary discharge planning process, led by the attending  physician.  Recommendations may be updated based on patient status, additional functional criteria and insurance authorization.  Follow Up Recommendations Skilled nursing-short term rehab (<3 hours/day) Can patient physically be transported by private vehicle: No    Assistance Recommended at Discharge Intermittent Supervision/Assistance  Patient can return home with the following  A lot of help with bathing/dressing/bathroom;A lot of help with walking and/or transfers;Help with stairs or ramp for entrance;Assistance with cooking/housework    Equipment Recommendations None recommended by PT  Recommendations for Other Services       Functional Status Assessment Patient has had a recent decline in their functional status and demonstrates the ability to make significant improvements in function in a reasonable and predictable amount of time.     Precautions / Restrictions Precautions Precautions: Fall Restrictions Weight Bearing Restrictions: No      Mobility  Bed Mobility Overal bed mobility: Needs Assistance Bed Mobility: Supine to Sit     Supine to sit: Mod assist, Max assist     General bed mobility comments: increased time with lobored movement, limited use of LUE due to swelling, possible infultrated IV    Transfers Overall transfer level: Needs assistance Equipment used: Rolling walker (2 wheels) Transfers: Sit to/from Stand, Bed to chair/wheelchair/BSC Sit to Stand: Mod assist   Step pivot transfers: Mod assist, Max assist       General transfer comment: unsteady labored movement    Ambulation/Gait Ambulation/Gait assistance: Max assist, Mod assist Gait Distance (Feet): 3 Feet Assistive device: Rolling walker (2 wheels) Gait Pattern/deviations: Decreased step length - right, Decreased step length - left, Decreased stride  length, Trunk flexed Gait velocity: slow     General Gait Details: limited to a few slow labored unsteady side steps with near loss of  balance due to weakness  Stairs            Wheelchair Mobility    Modified Rankin (Stroke Patients Only)       Balance Overall balance assessment: Needs assistance Sitting-balance support: Feet supported, No upper extremity supported Sitting balance-Leahy Scale: Poor Sitting balance - Comments: fair/poor seated at EOB   Standing balance support: During functional activity, Bilateral upper extremity supported Standing balance-Leahy Scale: Poor Standing balance comment: using RW                             Pertinent Vitals/Pain Pain Assessment Pain Assessment: Faces Faces Pain Scale: Hurts a little bit Pain Location: BLE with movement Pain Descriptors / Indicators: Grimacing, Guarding, Sore Pain Intervention(s): Limited activity within patient's tolerance, Monitored during session, Repositioned    Home Living Family/patient expects to be discharged to:: Assisted living                 Home Equipment: Rollator (4 wheels);Cane - single point;Grab bars - tub/shower      Prior Function Prior Level of Function : Needs assist       Physical Assist : Mobility (physical);ADLs (physical) Mobility (physical): Bed mobility;Transfers;Gait;Stairs   Mobility Comments: Supervised short distanced household ambular using Rollator       Hand Dominance   Dominant Hand: Right    Extremity/Trunk Assessment   Upper Extremity Assessment Upper Extremity Assessment: Generalized weakness    Lower Extremity Assessment Lower Extremity Assessment: Generalized weakness    Cervical / Trunk Assessment Cervical / Trunk Assessment: Kyphotic  Communication   Communication: No difficulties  Cognition Arousal/Alertness: Awake/alert Behavior During Therapy: WFL for tasks assessed/performed Overall Cognitive Status: Within Functional Limits for tasks assessed                                          General Comments      Exercises      Assessment/Plan    PT Assessment Patient needs continued PT services  PT Problem List Decreased strength;Decreased activity tolerance;Decreased balance;Decreased mobility       PT Treatment Interventions DME instruction;Gait training;Stair training;Functional mobility training;Therapeutic activities;Therapeutic exercise;Patient/family education;Balance training    PT Goals (Current goals can be found in the Care Plan section)  Acute Rehab PT Goals Patient Stated Goal: return to ALF PT Goal Formulation: With patient/family Time For Goal Achievement: 05/05/22 Potential to Achieve Goals: Good    Frequency Min 3X/week     Co-evaluation               AM-PAC PT "6 Clicks" Mobility  Outcome Measure Help needed turning from your back to your side while in a flat bed without using bedrails?: A Lot Help needed moving from lying on your back to sitting on the side of a flat bed without using bedrails?: A Lot Help needed moving to and from a bed to a chair (including a wheelchair)?: A Lot Help needed standing up from a chair using your arms (e.g., wheelchair or bedside chair)?: A Lot Help needed to walk in hospital room?: A Lot Help needed climbing 3-5 steps with a railing? : Total 6 Click Score: 11    End  of Session Equipment Utilized During Treatment: Oxygen Activity Tolerance: Patient tolerated treatment well;Patient limited by fatigue Patient left: in chair;with call bell/phone within reach Nurse Communication: Mobility status PT Visit Diagnosis: Unsteadiness on feet (R26.81);Other abnormalities of gait and mobility (R26.89);Muscle weakness (generalized) (M62.81)    Time: 1007-1219 PT Time Calculation (min) (ACUTE ONLY): 31 min   Charges:   PT Evaluation $PT Eval Moderate Complexity: 1 Mod PT Treatments $Therapeutic Activity: 23-37 mins        11:58 AM, 04/21/22 Lonell Grandchild, MPT Physical Therapist with [facility] [phone]  office [phone] mobile phone

## 2022-04-21 NOTE — Care Management Important Message (Signed)
Important Message  Patient Details  Name: Mary [name] MRN: [id number] Date of Birth: [birth year]   Medicare Important Message Given:  Yes     Tommy Medal 04/21/2022, 11:04 AM

## 2022-04-21 NOTE — TOC Progression Note (Signed)
Transition of Care [facility]) - Progression Note    Patient Details  Name: Mary [name] MRN: [id number] Date of Birth: 1940-06-08  Transition of Care [facility]) CM/SW Contact  Shade Flood, LCSW Phone Number: 04/21/2022, 2:30 PM  Clinical Narrative:     TOC following. PT recommending SNF rehab at dc. Spoke with pt's husband by phone to review same. Husband agreeable to referrals. CMS provider options reviewed and Medicare star ratings list will be left in pt's room for husband to review. TOC will follow up with pt's husband in pt's room on Monday AM at [place] to further assist with rehab placement process.  Updated Juliann Pulse at [facility]. TOC will start insurance auth when pt closer to being medically stable for dc.  Will follow.  Expected Discharge Plan: [place] Barriers to Discharge: Continued Medical Work up  Expected Discharge Plan and Services In-house Referral: Clinical Social Work Discharge Planning Services: CM Consult Post Acute Care Choice: [place] Living arrangements for the past 2 months: [place]                                       Social Determinants of Health (SDOH) Interventions SDOH Screenings   Food Insecurity: No Food Insecurity (04/15/2022)  Housing: Low Risk  (04/15/2022)  Transportation Needs: No Transportation Needs (04/15/2022)  Utilities: Not At Risk (04/15/2022)  Tobacco Use: Low Risk  (04/14/2022)    Readmission Risk Interventions    04/16/2022   12:03 PM 12/19/2020    2:33 PM  Readmission Risk Prevention Plan  Transportation Screening Complete Complete  Medication Review Press photographer) Complete Complete  HRI or Home Care Consult Complete Complete  SW Recovery Care/Counseling Consult Complete Complete  Palliative Care Screening Not Applicable Not Applicable  Skilled Nursing Facility Complete Complete

## 2022-04-21 NOTE — Progress Notes (Signed)
PROGRESS NOTE  Mary TRAMPE TOI:[id number] DOB: [birth year] DOA: 04/14/2022 PCP: Pcp, No  Brief History:  82 year old female with a history of dementia, hypertension, hyperlipidemia, CKD stage III, anxiety, mechanical aortic valve, stroke, diastolic CHF, and anxiety presenting from [facility] with generalized weakness for 3 to 4 days.  She has had associated decreased oral intake and nausea.  The patient is a difficult historian.  The patient's spouse is at the bedside.  He is also a difficult historian.  Nevertheless, it appears that the patient was treated for pneumonia.  Review of the [facility] from [place] shows the patient was started on levofloxacin on 04/08/2022 for 5-day course.  Even with the antibiotics, the patient continued to have progressive generalized weakness.  She denies any headache, neck pain, chest pain, hemoptysis, vomiting, diarrhea.  She does complain of abdominal pain.  She states that the abdominal pain has been present for 3 to 4 days.  There is no hematochezia or melena.  She has some dyspnea with exertion. In the ED, the patient was afebrile hemodynamically stable with oxygen saturation 99% on room air.  WBC 10.8, hemoglobin 9.3, platelets 302,000.  Sodium 135, potassium 4.4, bicarbonate 24, serum creatinine 1.77.  The patient was started on IV ceftriaxone and azithromycin and IV fluids.  She was admitted for further evaluation and treatment.   Assessment/Plan: SIRS -concerned about intra-abdominal source of infection initially -CT abd--- Large areas of presumed hematoma involving the rectus abdominis muscles bilaterally, left-greater-than-right. There is also large extraperitoneal hematoma along the margin of the iliopsoas muscle on the left. -presented with leukocytosis, tachycardia, elevated lactate -follow blood culture--neg to date -UA neg for pyuria -Lactic acid peaked 2.2 -Continue IV fluids>>saline locked -Treated with IV Zosyn and subsequently transition  to Augmentin on 04/18/2022; planning for 3 more days of antibiotics; given acute choking event with concern for reaspiration process. -Chest x-ray without focal consolidation -sepsis ruled out   Abdominal pain/extraperitoneal/retroperitoneal bleed/ Acute Blood Loss Anemia -CT abdomen and pelvis--- Large areas of presumed hematoma involving the rectus abdominismuscles bilaterally, left-greater-than-right. There is also largeextraperitoneal hematoma along the margin of the iliopsoas muscle on the left -Status post 8 units PRBC transfused throughout hospitalization. -Patient has also received fresh frozen plasma transfusion. -fentanyl prn pain -Continue to follow hemoglobin trend. -Latest hemoglobin 9.5 -Continue to follow trend   Supratherapeutic INR -Likely secondary to the patient's recent use of levofloxacin and decreased oral intake in the setting of warfarin use -Vitamin K 20 mg total given for the admission -9 units FFP given  total (last one ordered 1/30) -currently subtherapeutic and stable. -Heparin drip will be started.   Status post mechanical aortic valve replacement -Continue anticoagulation with INR goal of 2.5-3.5 -Holding warfarin temporarily secondary to supratherapeutic INR -Now that patient's hemoglobin has remained stable; heparin drip will be started with intention to resume Coumadin down the road.   Aspiration pneumonitis -initially started on zosyn -Successfully transitioned to amox/clav>> planning for 1 more days to finish abx therapy. -Continue the use of Mucinex, flutter valve and as needed bronchodilator.   Essential hypertension -Vital signs stable -Continue to follow blood pressure trend and resume antihypertensive agents as required.   Dementia without behavioral disturbance -At risk for hospital delirium -Continue constant reorientation.   Mixed hyperlipidemia -Resume the use of simvastatin at time of discharge. -Continue heart healthy diet.    Anxiety/depression -Continue BuSpar   Acute on chronic renal failure--CKD stage IV -Renal function has  improved and back to baseline at this moment (creatinine 1.39) -due to hemodynamic changes, IV contrast -Good response to Lasix given; no crackles appreciated on examination. -Continue to monitor daily basic metabolic panel.   Thrombocytopenia -due to acute medical illness -Continue to follow platelet counts trend/stability.   Hypokalemia -In the setting of GI losses and decreased oral intake -Magnesium within normal limit -Continue to follow electrolytes trend and further replete as needed. -Stable overall.  Diarrhea -C. difficile PCR negative -Most likely triggered by the use of antibiotics -Continue supportive care. -Continue treatment with Florastor and as needed Imodium.  Dysphagia/weakness and deconditioning -CT scan not demonstrating acute ischemic process -Most likely secondary to aspiration and vocal cord dysfunction -Continue supportive care -Continue to follow recommendations by speech therapy. -Physical therapy recommending skilled nursing facility for rehabilitation at discharge.   Goals of Care -confirmed with patient, daughter, spouse>>DNR -Continue current goals of care. -Physical therapy evaluation will be requested -Overall prognosis and future recovery guarded.      Family Communication:   Patient's husband updated at bedside 04/20/2022; patient's daughter updated over the phone 04/21/2022.   Consultants:  none   Code Status:  DNR   DVT Prophylaxis:  SCDs     Procedures: As Listed in Progress Note Above   Antibiotics: Zosyn 1/26>>1/29 Amox/clav 1/29>> Azithro 1/26>>1/28   Subjective: No overt bleeding reported; stable vital signs overall.  Requiring 4-5 L nasal cannula supplementation to maintain saturation and demonstrating intermittent tachypnea with exception.  No further choking episode reported.  So far tolerating nectar thick liquids  and dysphagia 1 diet.   Objective: Vitals:   04/21/22 0900 04/21/22 1000 04/21/22 1100 04/21/22 1615  BP: (!) 140/51 (!) 134/52 (!) 146/66   Pulse: 99 92 97   Resp: (!) 27 (!) 30 (!) 25   Temp:    98.5 F (36.9 C)  TempSrc:    Oral  SpO2: 99% 99% 100%   Weight:      Height:        Intake/Output Summary (Last 24 hours) at 04/21/2022 1755 Last data filed at 04/21/2022 1612 Gross per 24 hour  Intake 576.45 ml  Output 415 ml  Net 161.45 ml   Weight change:   Exam: General exam: Alert, awake, oriented x 3; denying chest pain, no nausea, no vomiting or any further choking episode.  Currently using dysphagia 1 diet with nectar thick liquids and was found to require 4-5 L nasal cannula supplementation to maintain saturation.  Patient is afebrile. Respiratory system: Decreased breath sounds at the bases; no using accessory muscles.  Positive rhonchi bilaterally. Cardiovascular system: Rate controlled, no rubs, no gallops, no JVD. Gastrointestinal system: Abdomen is nondistended, soft and mildly tender to deep palpation in her flank areas; patient's flank bruises/hematoma appears to be clearing and is slowly reabsorbing. Central nervous system: Alert and oriented.  Some difficulties with articulation and ability to speak appreciated; moving 4 limbs spontaneously.  No facial droop. Extremities: No cyanosis or clubbing; 1+ lower extremity edema and upper extremities swelling (left more than right) appreciated on exam. Skin: No petechiae.  No open wounds. Psychiatry: Judgement and insight appear normal. Mood & affect appropriate.   Data Reviewed: I have personally reviewed following labs and imaging studies  Basic Metabolic Panel: Recent Labs  Lab 04/16/22 0520 04/17/22 0416 04/18/22 0433 04/19/22 0421 04/20/22 0421  NA 133* 136 138 137 136  K 3.4* 3.0* 3.5 3.1* 3.6  CL 102 104 107 106 102  CO2 21* 23 21*  26 25  GLUCOSE 164* 129* 144* 92 116*  BUN 49* 52* 46* 40* 36*  CREATININE  2.34* 2.06* 1.80* 1.47* 1.39*  CALCIUM 6.8* 7.4* 8.2* 8.3* 8.4*  MG 2.1  --   --  2.2  --    Liver Function Tests: Recent Labs  Lab 04/15/22 0455  AST 42*  ALT 18  ALKPHOS 59  BILITOT 2.0*  PROT 5.7*  ALBUMIN 2.4*   Coagulation Profile: Recent Labs  Lab 04/15/22 0455 04/16/22 0520 04/17/22 0416 04/18/22 0748 04/19/22 0421  INR 4.7* 2.3* 1.8* 1.7* 1.6*   CBC: Recent Labs  Lab 04/16/22 0520 04/16/22 1122 04/17/22 0416 04/17/22 0701 04/18/22 0706 04/19/22 0421 04/20/22 0421  WBC 13.7*  --  8.1  --  10.7* 8.4 8.2  HGB 7.8*   < > 6.6* 6.9* 9.9* 9.4* 9.5*  HCT 22.7*   < > 19.7* 20.8* 29.6* 29.0* 30.2*  MCV 92.3  --  95.6  --  94.9 98.6 101.3*  PLT 121*  --  101*  --  119* 110* 109*   < > = values in this interval not displayed.   Urine analysis:    Component Value Date/Time   COLORURINE YELLOW 04/14/2022 2200   APPEARANCEUR CLEAR 04/14/2022 2200   LABSPEC >1.046 (H) 04/14/2022 2200   PHURINE 5.0 04/14/2022 2200   GLUCOSEU NEGATIVE 04/14/2022 2200   HGBUR NEGATIVE 04/14/2022 2200   BILIRUBINUR NEGATIVE 04/14/2022 2200   KETONESUR NEGATIVE 04/14/2022 2200   PROTEINUR NEGATIVE 04/14/2022 2200   UROBILINOGEN 2.0 (H) 11/01/2007 1518   NITRITE NEGATIVE 04/14/2022 2200   LEUKOCYTESUR NEGATIVE 04/14/2022 2200   Sepsis Labs:  Recent Results (from the past 240 hour(s))  Blood culture (routine x 2)     Status: None   Collection Time: 04/14/22 10:30 AM   Specimen: BLOOD  Result Value Ref Range Status   Specimen Description   Final    BLOOD LEFT ASSIST CONTROL Performed at [facility] [place], [address], [place], [place] [address]    Special Requests   Final    BOTTLES DRAWN AEROBIC AND ANAEROBIC Blood Culture adequate volume Performed at [facility], [address], [place], [place] [address]    Culture   Final    NO GROWTH 5 DAYS Performed at [facility], [place] [address], [place], [place] [address]    Report Status 04/19/2022 FINAL  Final  Blood culture  (routine x 2)     Status: None   Collection Time: 04/14/22 10:42 AM   Specimen: BLOOD  Result Value Ref Range Status   Specimen Description   Final    BLOOD RIGHT ASSIST CONTROL Performed at [facility], [address], [place], [place] [address]    Special Requests   Final    BOTTLES DRAWN AEROBIC AND ANAEROBIC Blood Culture adequate volume Performed at [facility], [address] [place], [place] [address]    Culture   Final    NO GROWTH 5 DAYS Performed at [facility], [place] [address] [place], [place] [address]    Report Status 04/19/2022 FINAL  Final  Resp panel by RT-PCR (RSV, Flu A&B, Covid) Anterior Nasal Swab     Status: None   Collection Time: 04/14/22  6:00 PM   Specimen: Anterior Nasal Swab  Result Value Ref Range Status   SARS Coronavirus 2 by RT PCR NEGATIVE NEGATIVE Final    Comment: (NOTE) SARS-CoV-2 target nucleic acids are NOT DETECTED.  The SARS-CoV-2 RNA is generally detectable  in upper respiratory specimens during the acute phase of infection. The lowest concentration of SARS-CoV-2 viral copies this assay can detect is 138 copies/mL. A negative result does not preclude SARS-Cov-2 infection and should not be used as the sole basis for treatment or other patient management decisions. A negative result may occur with  improper specimen collection/handling, submission of specimen other than nasopharyngeal swab, presence of viral mutation(s) within the areas targeted by this assay, and inadequate number of viral copies(<138 copies/mL). A negative result must be combined with clinical observations, patient history, and epidemiological information. The expected result is Negative.  Fact Sheet for Patients:  EntrepreneurPulse.com.au  Fact Sheet for Healthcare Providers:  IncredibleEmployment.be  This test is no t yet approved or cleared by the [place] FDA and  has been authorized for detection and/or diagnosis  of SARS-CoV-2 by FDA under an Emergency Use Authorization (EUA). This EUA will remain  in effect (meaning this test can be used) for the duration of the COVID-19 declaration under Section 564(b)(1) of the Act, 21 U.S.C.section 360bbb-3(b)(1), unless the authorization is terminated  or revoked sooner.       Influenza A by PCR NEGATIVE NEGATIVE Final   Influenza B by PCR NEGATIVE NEGATIVE Final    Comment: (NOTE) The Xpert Xpress SARS-CoV-2/FLU/RSV plus assay is intended as an aid in the diagnosis of influenza from Nasopharyngeal swab specimens and should not be used as a sole basis for treatment. Nasal washings and aspirates are unacceptable for Xpert Xpress SARS-CoV-2/FLU/RSV testing.  Fact Sheet for Patients: EntrepreneurPulse.com.au  Fact Sheet for Healthcare Providers: IncredibleEmployment.be  This test is not yet approved or cleared by the [place] FDA and has been authorized for detection and/or diagnosis of SARS-CoV-2 by FDA under an Emergency Use Authorization (EUA). This EUA will remain in effect (meaning this test can be used) for the duration of the COVID-19 declaration under Section 564(b)(1) of the Act, 21 U.S.C. section 360bbb-3(b)(1), unless the authorization is terminated or revoked.     Resp Syncytial Virus by PCR NEGATIVE NEGATIVE Final    Comment: (NOTE) Fact Sheet for Patients: EntrepreneurPulse.com.au  Fact Sheet for Healthcare Providers: IncredibleEmployment.be  This test is not yet approved or cleared by the [place] FDA and has been authorized for detection and/or diagnosis of SARS-CoV-2 by FDA under an Emergency Use Authorization (EUA). This EUA will remain in effect (meaning this test can be used) for the duration of the COVID-19 declaration under Section 564(b)(1) of the Act, 21 U.S.C. section 360bbb-3(b)(1), unless the authorization is terminated  or revoked.  Performed at [facility], [address], [place], [place] [address]   MRSA Next Gen by PCR, Nasal     Status: None   Collection Time: 04/14/22  6:53 PM   Specimen: Nasal Mucosa; Nasal Swab  Result Value Ref Range Status   MRSA by PCR Next Gen NOT DETECTED NOT DETECTED Final    Comment: (NOTE) The GeneXpert MRSA Assay (FDA approved for NASAL specimens only), is one component of a comprehensive MRSA colonization surveillance program. It is not intended to diagnose MRSA infection nor to guide or monitor treatment for MRSA infections. Test performance is not FDA approved in patients less than 57 years old. Performed at [facility], [address] [place], [name] [address]   C Difficile Quick Screen (NO PCR Reflex)     Status: None   Collection Time: 04/18/22  4:52 AM   Specimen: STOOL  Result Value Ref Range Status   C Diff  antigen NEGATIVE NEGATIVE Final   C Diff toxin NEGATIVE NEGATIVE Final   C Diff interpretation No C. difficile detected.  Final    Comment: Performed at [facility], [address], [place],  [address]    Scheduled Meds:  amoxicillin-clavulanate  1 tablet Oral BID   buPROPion  150 mg Oral q AM   busPIRone  10 mg Oral BID   Chlorhexidine Gluconate Cloth  6 each Topical Daily   dextromethorphan-guaiFENesin  1 tablet Oral BID   furosemide  20 mg Intravenous Once   pantoprazole (PROTONIX) IV  40 mg Intravenous Q12H   saccharomyces boulardii  250 mg Oral BID   simvastatin  40 mg Oral q1800   sodium bicarbonate  650 mg Oral TID   sodium chloride flush  10-40 mL Intracatheter Q12H   Continuous Infusions:  heparin 900 Units/hr (04/21/22 1612)   Procedures/Studies: CT HEAD WO CONTRAST (5MM)  Result Date: 04/21/2022 CLINICAL DATA:  Neuro deficit.  Stroke suspected. EXAM: CT HEAD WITHOUT CONTRAST TECHNIQUE: Contiguous axial images were obtained from the base of the skull through the vertex without intravenous contrast. RADIATION DOSE REDUCTION:  This exam was performed according to the departmental dose-optimization program which includes automated exposure control, adjustment of the mA and/or kV according to patient size and/or use of iterative reconstruction technique. COMPARISON:  CT Brain 09/23/21 FINDINGS: Brain: No evidence of acute infarction, hemorrhage, hydrocephalus, extra-axial collection or mass lesion/mass effect. Sequela of moderate to severe chronic microvascular ischemic change. Chronic small right cerebellar infarct. Vascular: No hyperdense vessel or unexpected calcification. Skull: Normal. Negative for fracture or focal lesion. Sinuses/Orbits: Trace right mastoid effusion. No middle ear effusion. There is impacted cerumen in the left EAC. Right lens replacement. Redemonstrated mucosal thickening in the right maxillary sinus and dense inspissated secretions or chronic fungal colonization. Other: None IMPRESSION: 1. No hemorrhage or CT evidence of an acute cortical infarct. 2. Sequela of moderate to severe chronic microvascular ischemic change. Chronic small right cerebellar infarct. Electronically Signed   By: Marin Roberts M.D.   On: 04/21/2022 15:27   DG CHEST PORT 1 VIEW  Result Date: 04/20/2022 CLINICAL DATA:  Aspiration. EXAM: PORTABLE CHEST 1 VIEW COMPARISON:  04/14/2022 FINDINGS: Right arm PICC line tip terminates in the distal SVC. Previous median sternotomy. Stable mild cardiac enlargement. Persistent small bilateral pleural effusions, right greater than left. New bilateral lower lobe zone peribronchovascular opacities are identified along with scattered areas of subsegmental atelectasis. IMPRESSION: 1. New bilateral lower lobe peribronchovascular opacities compatible with aspiration. 2. Persistent small bilateral pleural effusions, right greater than left. Electronically Signed   By: Kerby Moors M.D.   On: 04/20/2022 09:44   [place] EKG SITE RITE  Result Date: 04/15/2022 If Site Rite image not attached, placement could not be  confirmed due to current cardiac rhythm.  CT Angio Abd/Pel w/ and/or w/o  Result Date: 04/14/2022 CLINICAL DATA:  Retroperitoneal bleed suspected, extraperitoneal bleed. EXAM: CTA ABDOMEN AND PELVIS WITHOUT AND WITH CONTRAST TECHNIQUE: Multidetector CT imaging of the abdomen and pelvis was performed using the standard protocol during bolus administration of intravenous contrast. Multiplanar reconstructed images and MIPs were obtained and reviewed to evaluate the vascular anatomy. RADIATION DOSE REDUCTION: This exam was performed according to the departmental dose-optimization program which includes automated exposure control, adjustment of the mA and/or kV according to patient size and/or use of iterative reconstruction technique. CONTRAST:  51mL OMNIPAQUE IOHEXOL 350 MG/ML SOLN COMPARISON:  Noncontrast CT earlier today. FINDINGS: VASCULAR Aorta: Moderate atherosclerosis. No  aneurysm, dissection or acute findings. Celiac: Calcified plaque at the origin causes mild stenosis. Distal branch vessels are patent. No aneurysm, dissection or acute findings. SMA: Mild diffuse atheromatous plaque with mild areas of stenosis. No aneurysm or acute findings. Renals: Mild plaque in the proximal renal arteries causing varying degrees of stenosis up to moderate. No aneurysm or acute findings. IMA: Patent. Inflow: Mild atherosclerosis without acute findings. Proximal Outflow: Bilateral common femoral and visualized portions of the superficial and profunda femoral arteries are patent without evidence of aneurysm, dissection, vasculitis or significant stenosis. Retroperitoneal and extraperitoneal hematomas. There is no active arterial extravasation within any of the retroperitoneal or extraperitoneal bleed. Veins: Venous phase imaging demonstrates patency of the iliac veins and IVC. Portal, splenic and mesenteric veins are patent. No areas of active extravasation or seen on venous phase. Review of the MIP images confirms the above  findings. NON-VASCULAR Lower chest: Right pleural effusion with pleural enhancement, unchanged from earlier today. Hepatobiliary: Nodular hepatic contours typical of cirrhosis. Water density there is a 13 mm low-density lesion in the left lobe of the liver series 12, image 21, measuring slightly higher than simple fluid density, nonspecific. Multiple gallstones without evidence of gallbladder inflammation. Pancreas: Parenchymal atrophy. No ductal dilatation or inflammation. Spleen: No splenomegaly. Adrenals/Urinary Tract: No adrenal nodule or hemorrhage. Bilateral renal parenchymal atrophy with multifocal areas of parenchymal scarring. Parenchymal calcifications in the upper pole the right kidney. No hydronephrosis. Displaced to the right due to left pelvic fluid collections. The bladder appears thick walled which is nonspecific. Stomach/Bowel: No bowel obstruction or inflammation. There is mild colonic distension with stool proximally and air distally. The appendix is not seen. Lymphatic: No gross adenopathy. Reproductive: Uterine vascular calcifications again seen. Other: Again seen multiple areas of presume extraperitoneal and retroperitoneal hematoma/hemorrhage. Largest area is in the left pelvis measuring approximately 12.9 x 7 cm with hematocrit level, measured on series 12, image 78. This may be contiguous with the left rectus abdominus musculature. More superiorly is an area of presumed rectus hematoma measuring 6.1 x 5.1 cm, measured on series 12, image 67. There is stranding in the left retroperitoneum tracking anterior to the iliopsoas muscle that is primarily ill-defined. Stranding within the right extraperitoneal M with ill-defined hemorrhage. Fluid collections within the upper abdominal wall musculature just underneath the anterior ribs, 8.7 x 3.3 cm on the left series 12, image 30, 4.0 x 2.7 cm on the right series 12, image 31. There is no evidence of active extravasation within any of these  hematomas. There is edema within the subcutaneous tissues of the left flank/abdominal wall as well as anterior to the lower abdominal wall musculature. Musculoskeletal: Unchanged from earlier today with L4 compression deformity. Left hip arthroplasty. IMPRESSION: 1. No evidence of active extravasation within any of the retroperitoneal or extraperitoneal fluid collections/presumed hematoma. 2. Multiple areas of presume extraperitoneal and retroperitoneal hematoma/hemorrhage, largest area in the left pelvis measuring 12.9 x 7 cm, likely contiguous with the left rectus abdominus musculature. Allowing for differences in caliper placement in technique, no significant change in size of these collections from earlier today. Possibility of infection is considered but felt less likely. 3. Cirrhosis. Indeterminate 13 mm low-density lesion in the left lobe of the liver measuring slightly higher than simple fluid density. Recommend further evaluation with MRI on a nonemergent basis after resolution of acute event. 4. Cholelithiasis without acute cholecystitis. 5. Aortic and branch atherosclerosis with areas of arterial stenosis up to moderate in degree, as described. 6. Additional chronic  changes are stable from earlier today. Aortic Atherosclerosis (ICD10-I70.0). Electronically Signed   By: Keith Rake M.D.   On: 04/14/2022 16:41   CT CHEST ABDOMEN PELVIS WO CONTRAST  Result Date: 04/14/2022 CLINICAL DATA:  Generalized pain.  Sepsis.  Nausea. EXAM: CT CHEST, ABDOMEN AND PELVIS WITHOUT CONTRAST TECHNIQUE: Multidetector CT imaging of the chest, abdomen and pelvis was performed following the standard protocol without IV contrast. RADIATION DOSE REDUCTION: This exam was performed according to the departmental dose-optimization program which includes automated exposure control, adjustment of the mA and/or kV according to patient size and/or use of iterative reconstruction technique. COMPARISON:  Chest x-ray earlier  04/14/2022. Older x-rays. CT scan abdomen pelvis 2017 July FINDINGS: CT CHEST FINDINGS Cardiovascular: Heart is enlarged. Trace pericardial fluid. Prominent calcifications along the mitral valve. Postop chest with prosthetic aortic valve. The thoracic aorta has scattered calcified atherosclerotic plaque. Diameter of the ascending aorta at the level of the right pulmonary artery measures 4.3 by 4.0 cm. Mediastinum/Nodes: Small thyroid gland. No specific abnormal lymph node enlargement present in the axillary region, hila or mediastinum on this noncontrast exam. Prominent subcarinal node is noted measuring 17 by 10 mm on series 2, image 30. Normal caliber thoracic esophagus. Lungs/Pleura: Diffuse breathing motion. Small right-sided pleural effusion identified with some adjacent parenchymal opacities. Associated pleural thickening. This effusion was seen in 2017 and with slightly larger that time. More thickening today of the pleura. Atelectasis versus infiltrate. There is some bandlike areas of opacity as well in the middle lobe. Scattered areas of interstitial septal thickening are identified. There are few patchy ground-glass areas as well in the right upper lobe towards the apex such as series 3, image 42, series 3, image 53. Additional areas scattered in the left upper lobe, middle lobe and superior segment of left lower lobe. This has a differential including atypical or viral process. Recommend follow-up. Musculoskeletal: Osteopenia. Scattered degenerative changes noted along the spine. Of note the patient's arms were scanned at the patient's side. CT ABDOMEN PELVIS FINDINGS Hepatobiliary: Nodular contour to the liver. Please correlate for any known history of chronic liver disease. There are multiple stones in the nondilated gallbladder. Pancreas: Moderate pancreatic atrophy. No obvious mass. Appearance is similar to prior. Spleen: Spleen is grossly nonenlarged. Adrenals/Urinary Tract: Adrenal glands are  grossly preserved. There is mild bilateral renal atrophy with some focal areas along the midportion left kidney laterally. Some calcifications noted along the hilum of the right kidney which very well could be vascular. No definite ureteral or renal stones otherwise. The bladder is somewhat decompressed with wall thickening but there is mass effect along the bladder from the adjacent fluid collections. Stomach/Bowel: Stomach is mildly distended with fluid. The small bowel is nondilated. Large bowel is nondilated prominent right-sided colonic stool. Again evaluation is limited by significant motion. Vascular/Lymphatic: Diffuse vascular calcifications are identified with areas of significant stenosis suggested along branch vessels including SMA, renal arteries. Normal caliber IVC. Reproductive: Scattered calcifications along the uterus. These could be vascular. No separate adnexal mass. Other: Significant motion noted limited evaluation. Extensive areas of presumed hematoma identified. These include the rectus abdominis muscles bilaterally, left-greater-than-right. The left hematoma extends along the course of the entirety of the rectus abdominis muscle, cephalocaudal length approaching 29 cm. Transverse dimension at the level of the pelvis approximate 7.4 by 4.8 cm. Again the right-sided areas are smaller and are along the lower chest and upper abdomen regions. There is also a large extraperitoneal hematoma more on the left than  on the right with mass effect along surrounding structures including the bladder. This extends along the margin of the iliacus muscle. Longitudinal length on coronal image 89 of series 5 measures a proximally 18.2 cm. Axial dimension maximally approaches 10.5 by 5.5 cm. Anasarca. Musculoskeletal: Streak artifact related to patient's left hip arthroplasty obscuring the surrounding tissues. Curvature and degenerative changes seen along the spine. Osteopenia. Multilevel stenosis identified along  the lumbar spine. There is moderate compression of the superior endplate of L4 which was not seen in 2017 per the margins are somewhat sclerotic. Favor a subacute to chronic process. Please correlate with history. There is multilevel disc bulging. Degenerative changes seen along the pelvis. Critical Value/emergent results were called by telephone at the time of interpretation on 04/14/2022 at 1:59 pm to provider DAVID TAT , who verbally acknowledged these results. IMPRESSION: 1. Large areas of presumed hematoma involving the rectus abdominis muscles bilaterally, left-greater-than-right. There is also large extraperitoneal hematoma along the margin of the iliopsoas muscle on the left. This extends along the margin of the bladder with mass effect. 2. Small right-sided pleural effusion with adjacent parenchymal opacities. This effusion was seen in 2017 and with slightly larger that time. More thickening of the pleura today. Atelectasis versus infiltrate. 3. Patchy areas of ground-glass in both lungs. This has a differential including atypical or viral process. Recommend follow-up. 4. Cardiomegaly.  Postop chest. 5. Ascending aortic aneurysm measuring 4.3 x 4.0 cm. 6. Nodular contour to the liver. Please correlate for any known history of chronic liver disease. 7. Cholelithiasis. 8. Diffuse vascular calcifications with areas of significant stenosis along branch vessels including SMA, renal arteries. 9. Moderate compression of the superior endplate of L4, not seen in 2017 per the margins are somewhat sclerotic. Favor a subacute to chronic process. Please correlate with history. 10. Aortic atherosclerosis. Aortic Atherosclerosis (ICD10-I70.0). Electronically Signed   By: Jill Side M.D.   On: 04/14/2022 14:04   DG Chest 2 View  Result Date: 04/14/2022 CLINICAL DATA:  Fall EXAM: CHEST - 2 VIEW COMPARISON:  Chest x-ray dated September 23, 2021 FINDINGS: Cardiac and mediastinal contours are unchanged post median sternotomy.  Left-greater-than-right pleural effusions. No evidence of pneumothorax. Chronic appearing anterior second right rib fracture. IMPRESSION: Left-greater-than-right pleural effusions bibasilar atelectasis, similar to prior. Electronically Signed   By: Yetta Glassman M.D.   On: 04/14/2022 10:04    Barton Dubois, MD  [facility]  If 7PM-7AM, please contact night-coverage [email or web address] Password [facility] 04/21/2022, 5:55 PM   LOS: 7 days

## 2022-04-21 NOTE — Plan of Care (Signed)
  Problem: Acute Rehab PT Goals(only PT should resolve) Goal: Pt Will Go Supine/Side To Sit Outcome: Progressing Flowsheets (Taken 04/21/2022 1200) Pt will go Supine/Side to Sit: with minimal assist Goal: Patient Will Transfer Sit To/From Stand Outcome: Progressing Flowsheets (Taken 04/21/2022 1200) Patient will transfer sit to/from stand: with minimal assist Goal: Pt Will Transfer Bed To Chair/Chair To Bed Outcome: Progressing Flowsheets (Taken 04/21/2022 1200) Pt will Transfer Bed to Chair/Chair to Bed: with min assist Goal: Pt Will Ambulate Outcome: Progressing Flowsheets (Taken 04/21/2022 1200) Pt will Ambulate:  15 feet  with minimal assist  with moderate assist  with rolling walker   12:01 PM, 04/21/22 Lonell Grandchild, MPT Physical Therapist with [facility] [phone] office [phone] mobile phone

## 2022-04-21 NOTE — Progress Notes (Addendum)
ANTICOAGULATION CONSULT NOTE - Initial Consult  Pharmacy Consult for Heparin Indication: mechanical aortic valve   Allergies  Allergen Reactions   Tape Itching    Redness, Please use "paper" tape only    Patient Measurements: Height: 5\' 7"  (170.2 cm) Weight: 66.5 kg (146 lb 9.7 oz) IBW/kg (Calculated) : 61.6 HEPARIN DW (KG): 63.7   Vital Signs: Temp: 98 F (36.7 C) (02/02 1941) Temp Source: Oral (02/02 1941) BP: 135/76 (02/02 1600) Pulse Rate: 93 (02/02 1600)  Labs: Recent Labs    04/19/22 0421 04/20/22 0421 04/21/22 1858  HGB 9.4* 9.5*  --   HCT 29.0* 30.2*  --   PLT 110* 109*  --   LABPROT 19.1*  --   --   INR 1.6*  --   --   HEPARINUNFRC  --   --  <0.10*  CREATININE 1.47* 1.39*  --      Estimated Creatinine Clearance: 29.3 mL/min (A) (by C-G formula based on SCr of 1.39 mg/dL (H)).   Medical History: Past Medical History:  Diagnosis Date   Anemia in CKD (chronic kidney disease)    Anxiety    Dementia with behavioral disturbance ([place])    Diabetes mellitus with neuropathy (HCC)    Diabetic neuropathy (HCC)    DJD (degenerative joint disease)    GERD (gastroesophageal reflux disease)    H/O aortic valve replacement    Hyperlipidemia    Hypertension    Neuromuscular disorder (HCC)    neuropathy in feet   Obesity    Sleep apnea    Stroke ([place])    " light stroke"   Vitamin D deficiency    Wears glasses     Medications:  Medications Prior to Admission  Medication Sig Dispense Refill Last Dose   ALPRAZolam (XANAX) 0.5 MG tablet Take 0.5 mg by mouth every 8 (eight) hours as needed for anxiety (agitation).   04/11/2022   buPROPion (WELLBUTRIN XL) 150 MG 24 hr tablet Take 150 mg by mouth in the morning.   04/13/2022   busPIRone (BUSPAR) 10 MG tablet Take 10 mg by mouth 2 (two) times daily.   04/13/2022   docusate sodium (COLACE) 100 MG capsule Take 1 capsule (100 mg total) by mouth 2 (two) times daily. 10 capsule 0 04/13/2022   ferrous sulfate 325 (65 FE)  MG tablet Take 325 mg by mouth every other day.   04/12/2022   guaifenesin (HUMIBID E) 400 MG TABS tablet Take 400 mg by mouth every 8 (eight) hours as needed (cough/congestion).   04/13/2022   HYDROcodone-acetaminophen (NORCO) 10-325 MG tablet Take 1 tablet by mouth every 6 (six) hours as needed for severe pain. (Patient taking differently: Take 1 tablet by mouth in the morning and at bedtime.) 30 tablet 0 04/13/2022   Multiple Vitamins-Minerals (MULTIVITAMIN WITH MINERALS) tablet Take 1 tablet by mouth in the morning.   04/13/2022   pantoprazole (PROTONIX) 40 MG tablet Take 1 tablet (40 mg total) by mouth daily before breakfast. 30 tablet 5 04/14/2022   polyethylene glycol (MIRALAX / GLYCOLAX) 17 g packet Take 17 g by mouth daily.   04/13/2022   QUEtiapine (SEROQUEL) 25 MG tablet Take 25 mg by mouth at bedtime.   04/13/2022   simvastatin (ZOCOR) 40 MG tablet Take 40 mg by mouth at bedtime.   04/13/2022   sodium bicarbonate 650 MG tablet Take 650 mg by mouth 3 (three) times daily.   04/13/2022   spironolactone (ALDACTONE) 50 MG tablet Take 50 mg by mouth  in the morning.   04/13/2022   torsemide (DEMADEX) 20 MG tablet Take 20 mg by mouth 2 (two) times daily.   04/13/2022   warfarin (COUMADIN) 3 MG tablet Take 1 tablet (3 mg total) by mouth daily.   04/13/2022 at 2000   amoxicillin (AMOXIL) 500 MG capsule Take 2,000 mg by mouth See admin instructions. Take 2000mg  by mouth once daily for 1 day one hour pre-op.      spironolactone (ALDACTONE) 100 MG tablet Take 0.5 tablets (50 mg total) by mouth daily. (Patient not taking: Reported on 04/14/2022)   Not Taking    Assessment: 82 year old female with a history of dementia, hypertension, hyperlipidemia, CKD stage III, anxiety, mechanical aortic valve, stroke, diastolic CHF, and anxiety presenting from [facility] with generalized weakness for 3 to 4 days . Patient had Abdominal pan and CT abdomen showed large areas of presumed hematoma. Patient with extraperitoneal and  retroperitoneal bleed. Now stabilized and MD wants to restart anticoagulation conservatively with Heparin d/t mechanical AV. MD wanted HL goal of 0.3-0.5 with no boluses.   Today, 04/21/22 18:58 heparin level sub-therapeutic at <0.10 with heparin infusing at 900 units/hr No infusion issues and no active bleeding per nurse  Goal of Therapy:  Heparin level 0.3-0.5 units/ml Monitor platelets by anticoagulation protocol: Yes   Plan:  No heparin boluses Increase IV heparin to 1100 units/hr Then obtain 8 hour heparin level Monitor daily heparin level, CBC, signs/symptoms of bleeding F/U restart coumadin   Thank you for allowing pharmacy to be a part of this patient's care.  Royetta Asal, PharmD, [place] 04/21/2022 8:44 PM

## 2022-04-21 NOTE — NC FL2 (Signed)
[place] LEVEL OF CARE FORM     IDENTIFICATION  Patient Name: Mary [name] Birthdate: Sep 05, 1940 Sex: female Admission Date (Current Location): 04/14/2022  [facility] and [place] Number:  [employer] and Address:  [facility] [address], [place]      Provider Number: [id number]  Attending Physician Name and Address:  Barton Dubois, MD  Relative Name and Phone Number:       Current Level of Care: Hospital Recommended Level of Care: [place] Prior Approval Number:    Date Approved/Denied:   PASRR Number: [id number]  Discharge Plan: SNF    Current Diagnoses: Patient Active Problem List   Diagnosis Date Noted   Pneumonia due to infectious organism 04/17/2022   Acute blood loss anemia 04/17/2022   Retroperitoneal bleed 04/17/2022   Palliative care by specialist 04/17/2022   Aspiration pneumonitis ([place]) 04/16/2022   Sepsis due to undetermined organism ([place]) 04/14/2022   CKD (chronic kidney disease) stage 4, GFR 15-29 ml/min ([place]) 04/14/2022   Acquired thrombophilia ([place]) 04/14/2022   Elevated INR 04/14/2022   Chronic diastolic CHF (congestive heart failure) ([place])    Pelvis fracture ([place]) 12/18/2020   Malnutrition of moderate degree 08/13/2020   Closed displaced fracture of left femoral neck ([place]) 08/13/2020   Hip fracture ([place]) 08/11/2020   Agitation 02/21/2020   Confusion 02/21/2020   Hyperkalemia 01/15/2020   Acute renal failure superimposed on stage 4 chronic kidney disease ([place]) 01/15/2020   Anticoagulant long-term use 01/15/2020   Other secondary pulmonary hypertension ([place]) 10/28/2019   Acute on chronic combined systolic and diastolic CHF (congestive heart failure) ([place])    Anasarca 04/25/2019   Hypoalbuminemia 04/25/2019   Persistent atrial fibrillation ([place]) 04/25/2019   CKD (chronic kidney disease), stage III ([place]) 04/25/2019   Anemia due to chronic kidney disease 04/25/2019   Pressure  injury of skin 04/24/2019   Fall 04/23/2019   S/P right knee arthroscopy 04/13/2015   OSA (obstructive sleep apnea) 01/01/2013   Type 2 diabetes mellitus with hyperlipidemia ([place]) 10/23/2012   H/O mechanical aortic valve replacement 06/30/2010   Hyperlipidemia 09/29/2008   Obesity 09/29/2008   Essential hypertension 09/29/2008   DEGENERATIVE JOINT DISEASE 09/29/2008    Orientation RESPIRATION BLADDER Height & Weight     Self  O2 (see dc summary) Incontinent Weight: 146 lb 9.7 oz (66.5 kg) Height:  5\' 7"  (170.2 cm)  BEHAVIORAL SYMPTOMS/MOOD NEUROLOGICAL BOWEL NUTRITION STATUS      Continent Diet (see dc summary)  AMBULATORY STATUS COMMUNICATION OF NEEDS Skin   Extensive Assist Verbally Normal                       Personal Care Assistance Level of Assistance  Bathing, Feeding, Dressing Bathing Assistance: Limited assistance Feeding assistance: Independent Dressing Assistance: Limited assistance     Functional Limitations Info  Sight, Hearing, Speech Sight Info: Impaired Hearing Info: Adequate Speech Info: Adequate    SPECIAL CARE FACTORS FREQUENCY  PT (By licensed PT), OT (By licensed OT)     PT Frequency: 5x week OT Frequency: 3x week            Contractures Contractures Info: Not present    Additional Factors Info  Code Status, Allergies Code Status Info: DNR Allergies Info: Tape           Current Medications (04/21/2022):  This is the current hospital active medication list Current Facility-Administered Medications  Medication Dose Route Frequency Provider  Last Rate Last Admin   acetaminophen (TYLENOL) tablet 650 mg  650 mg Oral Q6H PRN Tat, Shanon Brow, MD   650 mg at 04/15/22 1546   Or   acetaminophen (TYLENOL) suppository 650 mg  650 mg Rectal Q6H PRN Tat, Shanon Brow, MD   650 mg at 04/14/22 2318   ALPRAZolam (XANAX) tablet 0.5 mg  0.5 mg Oral Q8H PRN Tat, Shanon Brow, MD   0.5 mg at 04/21/22 1420   amoxicillin-clavulanate (AUGMENTIN) 500-125 MG per tablet 1  tablet  1 tablet Oral BID Barton Dubois, MD   1 tablet at 04/21/22 0902   buPROPion (WELLBUTRIN XL) 24 hr tablet 150 mg  150 mg Oral q AM Tat, Shanon Brow, MD   150 mg at 04/21/22 0641   busPIRone (BUSPAR) tablet 10 mg  10 mg Oral BID Orson Eva, MD   10 mg at 04/21/22 9767   Chlorhexidine Gluconate Cloth 2 % PADS 6 each  6 each Topical Daily Tat, David, MD   6 each at 04/21/22 0909   dextromethorphan-guaiFENesin ([place] DM) 30-600 MG per 12 hr tablet 1 tablet  1 tablet Oral BID Barton Dubois, MD   1 tablet at 04/21/22 0900   fentaNYL (SUBLIMAZE) injection 12.5 mcg  12.5 mcg Intravenous Q3H PRN Tat, Shanon Brow, MD   12.5 mcg at 04/21/22 1248   heparin ADULT infusion 100 units/mL (25000 units/29mL)  900 Units/hr Intravenous Continuous Barton Dubois, MD 9 mL/hr at 04/21/22 1208 900 Units/hr at 04/21/22 1208   ipratropium-albuterol (DUONEB) 0.5-2.5 (3) MG/3ML nebulizer solution 3 mL  3 mL Nebulization Q6H PRN Barton Dubois, MD   3 mL at 04/20/22 0848   loperamide (IMODIUM) capsule 2 mg  2 mg Oral PRN Barton Dubois, MD   2 mg at 04/21/22 1212   ondansetron (ZOFRAN) tablet 4 mg  4 mg Oral Q6H PRN Tat, Shanon Brow, MD       Or   ondansetron [facility]) injection 4 mg  4 mg Intravenous Q6H PRN Tat, Shanon Brow, MD   4 mg at 04/19/22 2039   pantoprazole (PROTONIX) injection 40 mg  40 mg Intravenous Therisa Doyne, MD   40 mg at 04/21/22 3419   saccharomyces boulardii (FLORASTOR) capsule 250 mg  250 mg Oral BID Barton Dubois, MD   250 mg at 04/21/22 0859   simvastatin (ZOCOR) tablet 40 mg  40 mg Oral q1800 Tat, Shanon Brow, MD   40 mg at 04/20/22 1620   sodium bicarbonate tablet 650 mg  650 mg Oral TID Orson Eva, MD   650 mg at 04/21/22 1420   sodium chloride flush (NS) 0.9 % injection 10-40 mL  10-40 mL Intracatheter Q12H Orson Eva, MD   10 mL at 04/21/22 0909   sodium chloride flush (NS) 0.9 % injection 10-40 mL  10-40 mL Intracatheter PRN Orson Eva, MD         Discharge Medications: Please see discharge summary for a  list of discharge medications.  Relevant Imaging Results:  Relevant Lab Results:   Additional Information SSN: [id number], LCSW

## 2022-04-21 NOTE — Progress Notes (Signed)
Speech Language Pathology Treatment: Dysphagia  Patient Details Name: Mary [name] MRN: [id number] DOB: 02-21-1941 Today's Date: 04/21/2022 Time: 0930-1008 SLP Time Calculation (min) (ACUTE ONLY): 38 min  Assessment / Plan / Recommendation Clinical Impression  Ongoing diagnostic dysphagia therapy provided today while Pt was sitting upright in chair. Pt's spouse present for treatment this morning; Pt was alert but lethargic and requiring verbal cues to remain alert throughout treatment. Pt took small bites of a banana and with cues demonstrated thorough mastication of soft banana and a seemingly timely swallow. Pt was provided small sips of thin liquids and demonstrated no immediate cough however note wet vocal quality after all thin trials. No coughing or wet vocal quality were noted with NTL trials. Recommend continue with current diet of D1/puree and NECTAR thick liquids as Pt continues to be lethargic and demonstrate wet vocal quality with thin liquids. ST will continue to follow acutely for possible subjective upgrade or objective measure if indicated.     HPI HPI: 82 year old female with a history of dementia, hypertension, hyperlipidemia, CKD stage III, anxiety, mechanical aortic valve, stroke, diastolic CHF, and anxiety presenting from [facility] with generalized weakness for 3 to 4 days.  She has had associated decreased oral intake and nausea.  The patient is a difficult historian.  The patient's spouse is at the bedside.  He is also a difficult historian.  Nevertheless, it appears that the patient was treated for pneumonia.  Review of the [facility] from [place] shows the patient was started on levofloxacin on 04/08/2022 for 5-day course.  Even with the antibiotics, the patient continued to have progressive generalized weakness.  She denies any headache, neck pain, chest pain, hemoptysis, vomiting, diarrhea.  She does complain of abdominal pain.  She states that the abdominal pain has been present for  3 to 4 days.  There is no hematochezia or melena.  She has some dyspnea with exertion.  In the ED, the patient was afebrile hemodynamically stable with oxygen saturation 99% on room air.  WBC 10.8, hemoglobin 9.3, platelets 302,000.  Sodium 135, potassium 4.4, bicarbonate 24, serum creatinine 1.77.  The patient was started on IV ceftriaxone and azithromycin and IV fluids.  She was admitted for further evaluation and treatment. On 04/20/22 Acute respiratory distress while eating breakfast experiencing choking event.  Transient use of nonrebreather and subsequently 5 L nasal cannula supplementation. BSE requested.      SLP Plan  Continue with current plan of care      Recommendations for follow up therapy are one component of a multi-disciplinary discharge planning process, led by the attending physician.  Recommendations may be updated based on patient status, additional functional criteria and insurance authorization.    Recommendations  Diet recommendations: Nectar-thick liquid;Dysphagia 1 (puree) Liquids provided via: Cup;Straw Medication Administration: Whole meds with liquid Supervision: Full supervision/cueing for compensatory strategies Compensations: Slow rate;Small sips/bites Postural Changes and/or Swallow Maneuvers: Seated upright 90 degrees                Oral Care Recommendations: Oral care BID;Staff/trained caregiver to provide oral care Follow Up Recommendations: Follow physician's recommendations for discharge plan and follow up therapies Assistance recommended at discharge: None SLP Visit Diagnosis: Dysphagia, unspecified (R13.10) Plan: Continue with current plan of care         Jareb Radoncic H. Roddie Mc, CCC-SLP Speech Language Pathologist   Wende Bushy  04/21/2022, 10:08 AM

## 2022-04-22 DIAGNOSIS — J690 Pneumonitis due to inhalation of food and vomit: Secondary | ICD-10-CM | POA: Diagnosis not present

## 2022-04-22 DIAGNOSIS — Z515 Encounter for palliative care: Secondary | ICD-10-CM | POA: Diagnosis not present

## 2022-04-22 DIAGNOSIS — D6869 Other thrombophilia: Secondary | ICD-10-CM | POA: Diagnosis not present

## 2022-04-22 DIAGNOSIS — A419 Sepsis, unspecified organism: Secondary | ICD-10-CM | POA: Diagnosis not present

## 2022-04-22 LAB — HEPARIN LEVEL (UNFRACTIONATED)
Heparin Unfractionated: 0.19 IU/mL — ABNORMAL LOW (ref 0.30–0.70)
Heparin Unfractionated: >1.1 IU/mL — ABNORMAL HIGH (ref 0.30–0.70)
Heparin Unfractionated: 0.28 IU/mL — ABNORMAL LOW (ref 0.30–0.70)

## 2022-04-22 LAB — CBC
HCT: 28.2 % — ABNORMAL LOW (ref 36.0–46.0)
Hemoglobin: 8.7 g/dL — ABNORMAL LOW (ref 12.0–15.0)
MCH: 32 pg (ref 26.0–34.0)
MCHC: 30.9 g/dL (ref 30.0–36.0)
MCV: 103.7 fL — ABNORMAL HIGH (ref 80.0–100.0)
Platelets: 114 10*3/uL — ABNORMAL LOW (ref 150–400)
RBC: 2.72 MIL/uL — ABNORMAL LOW (ref 3.87–5.11)
RDW: 19.4 % — ABNORMAL HIGH (ref 11.5–15.5)
WBC: 8.5 10*3/uL (ref 4.0–10.5)
nRBC: 0.2 % (ref 0.0–0.2)

## 2022-04-22 LAB — BASIC METABOLIC PANEL
Anion gap: 6 (ref 5–15)
BUN: 33 mg/dL — ABNORMAL HIGH (ref 8–23)
CO2: 26 mmol/L (ref 22–32)
Calcium: 8.4 mg/dL — ABNORMAL LOW (ref 8.9–10.3)
Chloride: 106 mmol/L (ref 98–111)
Creatinine, Ser: 1.16 mg/dL — ABNORMAL HIGH (ref 0.44–1.00)
GFR, Estimated: 46 mL/min — ABNORMAL LOW (ref >60)
Glucose, Bld: 103 mg/dL — ABNORMAL HIGH (ref 70–99)
Potassium: 3.8 mmol/L (ref 3.5–5.1)
Sodium: 138 mmol/L (ref 135–145)

## 2022-04-22 NOTE — Progress Notes (Signed)
ANTICOAGULATION CONSULT NOTE  Pharmacy Consult for Heparin Indication: mechanical aortic valve   Allergies  Allergen Reactions   Tape Itching    Redness, Please use "paper" tape only    Patient Measurements: Height: 5\' 7"  (170.2 cm) Weight: 66.5 kg (146 lb 9.7 oz) IBW/kg (Calculated) : 61.6 HEPARIN DW (KG): 63.7   Vital Signs: Temp: 97.4 F (36.3 C) (02/03 1629) Temp Source: Oral (02/03 1629) BP: 117/62 (02/03 1000) Pulse Rate: 98 (02/03 1000)  Labs: Recent Labs    04/20/22 0421 04/21/22 1858 04/22/22 0509 04/22/22 1430 04/22/22 1546  HGB 9.5*  --  8.7*  --   --   HCT 30.2*  --  28.2*  --   --   PLT 109*  --  114*  --   --   HEPARINUNFRC  --    < > 0.19* >1.10* 0.28*  CREATININE 1.39*  --  1.16*  --   --    < > = values in this interval not displayed.     Estimated Creatinine Clearance: 35.1 mL/min (A) (by C-G formula based on SCr of 1.16 mg/dL (H)).   Medical History: Past Medical History:  Diagnosis Date   Anemia in CKD (chronic kidney disease)    Anxiety    Dementia with behavioral disturbance ([place])    Diabetes mellitus with neuropathy (HCC)    Diabetic neuropathy (HCC)    DJD (degenerative joint disease)    GERD (gastroesophageal reflux disease)    H/O aortic valve replacement    Hyperlipidemia    Hypertension    Neuromuscular disorder (HCC)    neuropathy in feet   Obesity    Sleep apnea    Stroke ([place])    " light stroke"   Vitamin D deficiency    Wears glasses     Medications:  Medications Prior to Admission  Medication Sig Dispense Refill Last Dose   ALPRAZolam (XANAX) 0.5 MG tablet Take 0.5 mg by mouth every 8 (eight) hours as needed for anxiety (agitation).   04/11/2022   buPROPion (WELLBUTRIN XL) 150 MG 24 hr tablet Take 150 mg by mouth in the morning.   04/13/2022   busPIRone (BUSPAR) 10 MG tablet Take 10 mg by mouth 2 (two) times daily.   04/13/2022   docusate sodium (COLACE) 100 MG capsule Take 1 capsule (100 mg total) by mouth 2 (two)  times daily. 10 capsule 0 04/13/2022   ferrous sulfate 325 (65 FE) MG tablet Take 325 mg by mouth every other day.   04/12/2022   guaifenesin (HUMIBID E) 400 MG TABS tablet Take 400 mg by mouth every 8 (eight) hours as needed (cough/congestion).   04/13/2022   HYDROcodone-acetaminophen (NORCO) 10-325 MG tablet Take 1 tablet by mouth every 6 (six) hours as needed for severe pain. (Patient taking differently: Take 1 tablet by mouth in the morning and at bedtime.) 30 tablet 0 04/13/2022   Multiple Vitamins-Minerals (MULTIVITAMIN WITH MINERALS) tablet Take 1 tablet by mouth in the morning.   04/13/2022   pantoprazole (PROTONIX) 40 MG tablet Take 1 tablet (40 mg total) by mouth daily before breakfast. 30 tablet 5 04/14/2022   polyethylene glycol (MIRALAX / GLYCOLAX) 17 g packet Take 17 g by mouth daily.   04/13/2022   QUEtiapine (SEROQUEL) 25 MG tablet Take 25 mg by mouth at bedtime.   04/13/2022   simvastatin (ZOCOR) 40 MG tablet Take 40 mg by mouth at bedtime.   04/13/2022   sodium bicarbonate 650 MG tablet Take 650 mg  by mouth 3 (three) times daily.   04/13/2022   spironolactone (ALDACTONE) 50 MG tablet Take 50 mg by mouth in the morning.   04/13/2022   torsemide (DEMADEX) 20 MG tablet Take 20 mg by mouth 2 (two) times daily.   04/13/2022   warfarin (COUMADIN) 3 MG tablet Take 1 tablet (3 mg total) by mouth daily.   04/13/2022 at 2000   amoxicillin (AMOXIL) 500 MG capsule Take 2,000 mg by mouth See admin instructions. Take 2000mg  by mouth once daily for 1 day one hour pre-op.      spironolactone (ALDACTONE) 100 MG tablet Take 0.5 tablets (50 mg total) by mouth daily. (Patient not taking: Reported on 04/14/2022)   Not Taking    Assessment: 82 year old female with a history of mechanical aortic valve on warfarin presenting from [facility] with generalized weakness for 3 to 4 days . Patient had Abdominal pan and CT abdomen showed large areas of presumed hematoma. Patient with extraperitoneal and retroperitoneal  bleed. Now stabilized and resumed on anticoagulation conservatively on 2/2 with heparin for mechanical AVR. MD requests heparin level goal 0.3-0.5 with no boluses.   PM update - 1430 HL resulted >1.1 (suspected to be drawn incorrectly). Stat repeat heparin level resulted slightly low at 0.28, drawn correctly. CBC relatively stable. AKI improving. No bleeding or issues with infusion per discussion with RN.  Goal of Therapy:  Heparin level 0.3-0.5 units/ml Monitor platelets by anticoagulation protocol: Yes   Plan:  No heparin boluses Increase IV heparin conservatively to 1300 units/hr Check 8hr heparin level Monitor daily CBC, signs/symptoms of bleeding F/U plan for warfarin restart as appropriate   Arturo Morton, PharmD, BCPS Please check AMION for all [place] contact numbers Clinical Pharmacist 04/22/2022 4:45 PM

## 2022-04-22 NOTE — Progress Notes (Signed)
ANTICOAGULATION CONSULT NOTE - Initial Consult  Pharmacy Consult for Heparin Indication: mechanical aortic valve   Allergies  Allergen Reactions   Tape Itching    Redness, Please use "paper" tape only    Patient Measurements: Height: 5\' 7"  (170.2 cm) Weight: 66.5 kg (146 lb 9.7 oz) IBW/kg (Calculated) : 61.6 HEPARIN DW (KG): 63.7   Vital Signs: Temp: 97.8 F (36.6 C) (02/03 0037) Temp Source: Oral (02/02 1941) BP: 118/41 (02/03 0400) Pulse Rate: 90 (02/03 0400)  Labs: Recent Labs    04/20/22 0421 04/21/22 1858 04/22/22 0509  HGB 9.5*  --  8.7*  HCT 30.2*  --  28.2*  PLT 109*  --  114*  HEPARINUNFRC  --  <0.10* 0.19*  CREATININE 1.39*  --   --      Estimated Creatinine Clearance: 29.3 mL/min (A) (by C-G formula based on SCr of 1.39 mg/dL (H)).   Medical History: Past Medical History:  Diagnosis Date   Anemia in CKD (chronic kidney disease)    Anxiety    Dementia with behavioral disturbance ([place])    Diabetes mellitus with neuropathy (HCC)    Diabetic neuropathy (HCC)    DJD (degenerative joint disease)    GERD (gastroesophageal reflux disease)    H/O aortic valve replacement    Hyperlipidemia    Hypertension    Neuromuscular disorder (HCC)    neuropathy in feet   Obesity    Sleep apnea    Stroke ([place])    " light stroke"   Vitamin D deficiency    Wears glasses     Medications:  Medications Prior to Admission  Medication Sig Dispense Refill Last Dose   ALPRAZolam (XANAX) 0.5 MG tablet Take 0.5 mg by mouth every 8 (eight) hours as needed for anxiety (agitation).   04/11/2022   buPROPion (WELLBUTRIN XL) 150 MG 24 hr tablet Take 150 mg by mouth in the morning.   04/13/2022   busPIRone (BUSPAR) 10 MG tablet Take 10 mg by mouth 2 (two) times daily.   04/13/2022   docusate sodium (COLACE) 100 MG capsule Take 1 capsule (100 mg total) by mouth 2 (two) times daily. 10 capsule 0 04/13/2022   ferrous sulfate 325 (65 FE) MG tablet Take 325 mg by mouth every other  day.   04/12/2022   guaifenesin (HUMIBID E) 400 MG TABS tablet Take 400 mg by mouth every 8 (eight) hours as needed (cough/congestion).   04/13/2022   HYDROcodone-acetaminophen (NORCO) 10-325 MG tablet Take 1 tablet by mouth every 6 (six) hours as needed for severe pain. (Patient taking differently: Take 1 tablet by mouth in the morning and at bedtime.) 30 tablet 0 04/13/2022   Multiple Vitamins-Minerals (MULTIVITAMIN WITH MINERALS) tablet Take 1 tablet by mouth in the morning.   04/13/2022   pantoprazole (PROTONIX) 40 MG tablet Take 1 tablet (40 mg total) by mouth daily before breakfast. 30 tablet 5 04/14/2022   polyethylene glycol (MIRALAX / GLYCOLAX) 17 g packet Take 17 g by mouth daily.   04/13/2022   QUEtiapine (SEROQUEL) 25 MG tablet Take 25 mg by mouth at bedtime.   04/13/2022   simvastatin (ZOCOR) 40 MG tablet Take 40 mg by mouth at bedtime.   04/13/2022   sodium bicarbonate 650 MG tablet Take 650 mg by mouth 3 (three) times daily.   04/13/2022   spironolactone (ALDACTONE) 50 MG tablet Take 50 mg by mouth in the morning.   04/13/2022   torsemide (DEMADEX) 20 MG tablet Take 20 mg by mouth  2 (two) times daily.   04/13/2022   warfarin (COUMADIN) 3 MG tablet Take 1 tablet (3 mg total) by mouth daily.   04/13/2022 at 2000   amoxicillin (AMOXIL) 500 MG capsule Take 2,000 mg by mouth See admin instructions. Take 2000mg  by mouth once daily for 1 day one hour pre-op.      spironolactone (ALDACTONE) 100 MG tablet Take 0.5 tablets (50 mg total) by mouth daily. (Patient not taking: Reported on 04/14/2022)   Not Taking    Assessment: 82 year old female with a history of dementia, hypertension, hyperlipidemia, CKD stage III, anxiety, mechanical aortic valve, stroke, diastolic CHF, and anxiety presenting from [facility] with generalized weakness for 3 to 4 days . Patient had Abdominal pan and CT abdomen showed large areas of presumed hematoma. Patient with extraperitoneal and retroperitoneal bleed. Now stabilized and  MD wants to restart anticoagulation conservatively with Heparin d/t mechanical AV. MD wanted HL goal of 0.3-0.5 with no boluses.   Heparin level is up to 0.19 this AM. We will increase again and re-check. No bleeding per Rn overnight.   Goal of Therapy:  Heparin level 0.3-0.5 units/ml Monitor platelets by anticoagulation protocol: Yes   Plan:  No heparin boluses Increase IV heparin to 1250 units/hr Then obtain 8 hour heparin level Monitor daily heparin level, CBC, signs/symptoms of bleeding F/U restart coumadin  Onnie Boer, PharmD, BCIDP, AAHIVP, CPP Infectious Disease Pharmacist 04/22/2022 6:12 AM

## 2022-04-22 NOTE — Progress Notes (Signed)
PROGRESS NOTE  Mary [name] HUD:[id number] DOB: 21-May-1940 DOA: 04/14/2022 PCP: Pcp, No  Brief History:  82 year old female with a history of dementia, hypertension, hyperlipidemia, CKD stage III, anxiety, mechanical aortic valve, stroke, diastolic CHF, and anxiety presenting from [facility] with generalized weakness for 3 to 4 days.  She has had associated decreased oral intake and nausea.  The patient is a difficult historian.  The patient's spouse is at the bedside.  He is also a difficult historian.  Nevertheless, it appears that the patient was treated for pneumonia.  Review of the [facility] from [place] shows the patient was started on levofloxacin on 04/08/2022 for 5-day course.  Even with the antibiotics, the patient continued to have progressive generalized weakness.  She denies any headache, neck pain, chest pain, hemoptysis, vomiting, diarrhea.  She does complain of abdominal pain.  She states that the abdominal pain has been present for 3 to 4 days.  There is no hematochezia or melena.  She has some dyspnea with exertion. In the ED, the patient was afebrile hemodynamically stable with oxygen saturation 99% on room air.  WBC 10.8, hemoglobin 9.3, platelets 302,000.  Sodium 135, potassium 4.4, bicarbonate 24, serum creatinine 1.77.  The patient was started on IV ceftriaxone and azithromycin and IV fluids.  She was admitted for further evaluation and treatment.   Assessment/Plan: SIRS -concerned about intra-abdominal source of infection initially -CT abd--- Large areas of presumed hematoma involving the rectus abdominis muscles bilaterally, left-greater-than-right. There is also large extraperitoneal hematoma along the margin of the iliopsoas muscle on the left. -presented with leukocytosis, tachycardia, elevated lactate -follow blood culture--neg to date -UA neg for pyuria -Lactic acid peaked 2.2 -Continue IV fluids>>saline locked -Treated with IV Zosyn and subsequently transition  to Augmentin on 04/18/2022; planning for 3-4 more days of antibiotics; given second acute choking event with concern for reaspiration process. -Chest x-ray without focal consolidation -sepsis ruled out   Abdominal pain/extraperitoneal/retroperitoneal bleed/ Acute Blood Loss Anemia -CT abdomen and pelvis--- Large areas of presumed hematoma involving the rectus abdominismuscles bilaterally, left-greater-than-right. There is also largeextraperitoneal hematoma along the margin of the iliopsoas muscle on the left -Status post 8 units PRBC transfused throughout hospitalization. -Patient has also received fresh frozen plasma transfusion. -fentanyl prn pain -Continue to follow hemoglobin trend. -Latest hemoglobin 8.7 -Continue to follow trend   Supratherapeutic INR -Likely secondary to the patient's recent use of levofloxacin and decreased oral intake in the setting of warfarin use -Vitamin K 20 mg total given for the admission -9 units FFP given  total (last one ordered 1/30) -currently subtherapeutic and stable. -Heparin drip started. -Hemoglobin slightly down; no signs of overt bleeding.   Status post mechanical aortic valve replacement -Continue anticoagulation with INR goal of 2.5-3.5 -Continue holding warfarin temporarily secondary to supratherapeutic INR -Now that patient's hemoglobin has remained stable; heparin drip will be started with intention to resume Coumadin down the road.   Aspiration pneumonitis -initially started on zosyn -Successfully transitioned to amox/clav>> planning for 1 more days to finish abx therapy. -Continue the use of Mucinex, flutter valve and as needed bronchodilator.   Essential hypertension -Vital signs stable -Continue to follow blood pressure trend and resume antihypertensive agents as required.   Dementia without behavioral disturbance -At risk for hospital delirium -Continue constant reorientation.   Mixed hyperlipidemia -Resume the use of  simvastatin at time of discharge. -Continue heart healthy diet.   Anxiety/depression -Continue BuSpar   Acute on  chronic renal failure--CKD stage IV -Renal function has improved and back to baseline at this moment (creatinine 1.39) -due to hemodynamic changes, IV contrast -Good response to Lasix given; no crackles appreciated on examination.  Very still having signs of diffuse edema. -Continue to monitor daily basic metabolic panel. -Renal function has remained stable.   Thrombocytopenia -due to acute medical illness -Continue to follow platelet counts trend/stability.   Hypokalemia -In the setting of GI losses and decreased oral intake -Magnesium within normal limit -Continue to follow electrolytes trend and further replete as needed. -Stable overall.  Diarrhea -C. difficile PCR negative -Most likely triggered by the use of antibiotics -Continue supportive care. -Continue treatment with Florastor and as needed Imodium.  Dysphagia/weakness and deconditioning -CT scan not demonstrating not acute ischemic process -Most likely secondary to aspiration and vocal cord dysfunction -Continue supportive care -Continue to follow recommendations by speech therapy. -Physical therapy recommending skilled nursing facility for rehabilitation at discharge.   Goals of Care -confirmed with patient, daughter, spouse>>DNR -Continue current goals of care. -Physical therapy evaluation will be requested -Overall prognosis and future recovery guarded.      Family Communication:   Patient's husband updated at bedside 04/20/2022; patient's daughter updated over the phone 04/21/2022.   Consultants:  none   Code Status:  DNR   DVT Prophylaxis:  SCDs     Procedures: As Listed in Progress Note Above   Antibiotics: Zosyn 1/26>>1/29 Amox/clav 1/29>> Azithro 1/26>>1/28   Subjective: No overnight events; no overt bleeding reported.  Stable vital signs and oxygen supplementation down to 2 L.   So far tolerating dysphagia 1 with nectar thick liquids.   Objective: Vitals:   04/22/22 0800 04/22/22 1000 04/22/22 1116 04/22/22 1629  BP: (!) 122/51 117/62    Pulse: 94 98    Resp: (!) 22 19    Temp: (!) 97.4 F (36.3 C)  97.7 F (36.5 C) (!) 97.4 F (36.3 C)  TempSrc: Oral  Axillary Oral  SpO2: 99% 100%    Weight:      Height:        Intake/Output Summary (Last 24 hours) at 04/22/2022 1721 Last data filed at 04/22/2022 1600 Gross per 24 hour  Intake 297 ml  Output 400 ml  Net -103 ml   Weight change:   Exam: General exam: Alert, awake, oriented x 3; still demonstrating difficulty to articulate; but denying chest pain, nausea, vomiting and currently afebrile.  Following commands appropriately.  No overnight events. Respiratory system: Positive rhonchi bilaterally; no using accessory muscles.  Good saturation on 2 L. Cardiovascular system:RRR. No rubs or gallops; no JVD. Gastrointestinal system: Abdomen is nondistended, soft and mildly tender to deep palpation; positive increased abdominal girth with the presence of right flank hematoma. Central nervous system: Alert and oriented. No focal neurological deficits. Extremities: No cyanosis or clubbing; positive 1+ edema appreciated on all her extremities. Skin: No petechiae. Psychiatry: Judgement and insight appear normal. Mood & affect appropriate.   Data Reviewed: I have personally reviewed following labs and imaging studies  Basic Metabolic Panel: Recent Labs  Lab 04/16/22 0520 04/17/22 0416 04/18/22 0433 04/19/22 0421 04/20/22 0421 04/22/22 0509  NA 133* 136 138 137 136 138  K 3.4* 3.0* 3.5 3.1* 3.6 3.8  CL 102 104 107 106 102 106  CO2 21* 23 21* 26 25 26   GLUCOSE 164* 129* 144* 92 116* 103*  BUN 49* 52* 46* 40* 36* 33*  CREATININE 2.34* 2.06* 1.80* 1.47* 1.39* 1.16*  CALCIUM 6.8* 7.4*  8.2* 8.3* 8.4* 8.4*  MG 2.1  --   --  2.2  --   --    Liver Function Tests: No results for input(s): "AST", "ALT",  "ALKPHOS", "BILITOT", "PROT", "ALBUMIN" in the last 168 hours.  Coagulation Profile: Recent Labs  Lab 04/16/22 0520 04/17/22 0416 04/18/22 0748 04/19/22 0421  INR 2.3* 1.8* 1.7* 1.6*   CBC: Recent Labs  Lab 04/17/22 0416 04/17/22 0701 04/18/22 0706 04/19/22 0421 04/20/22 0421 04/22/22 0509  WBC 8.1  --  10.7* 8.4 8.2 8.5  HGB 6.6* 6.9* 9.9* 9.4* 9.5* 8.7*  HCT 19.7* 20.8* 29.6* 29.0* 30.2* 28.2*  MCV 95.6  --  94.9 98.6 101.3* 103.7*  PLT 101*  --  119* 110* 109* 114*   Urine analysis:    Component Value Date/Time   COLORURINE YELLOW 04/14/2022 2200   APPEARANCEUR CLEAR 04/14/2022 2200   LABSPEC >1.046 (H) 04/14/2022 2200   PHURINE 5.0 04/14/2022 2200   GLUCOSEU NEGATIVE 04/14/2022 2200   HGBUR NEGATIVE 04/14/2022 2200   BILIRUBINUR NEGATIVE 04/14/2022 2200   KETONESUR NEGATIVE 04/14/2022 2200   PROTEINUR NEGATIVE 04/14/2022 2200   UROBILINOGEN 2.0 (H) 11/01/2007 1518   NITRITE NEGATIVE 04/14/2022 2200   LEUKOCYTESUR NEGATIVE 04/14/2022 2200   Sepsis Labs:  Recent Results (from the past 240 hour(s))  Blood culture (routine x 2)     Status: None   Collection Time: 04/14/22 10:30 AM   Specimen: BLOOD  Result Value Ref Range Status   Specimen Description   Final    BLOOD LEFT ASSIST CONTROL Performed at [facility], [address], [place], [place] [address]    Special Requests   Final    BOTTLES DRAWN AEROBIC AND ANAEROBIC Blood Culture adequate volume Performed at [facility], [address], [place], [place] [address]    Culture   Final    NO GROWTH 5 DAYS Performed at [facility], [place] [address] [place], [place] [address]    Report Status 04/19/2022 FINAL  Final  Blood culture (routine x 2)     Status: None   Collection Time: 04/14/22 10:42 AM   Specimen: BLOOD  Result Value Ref Range Status   Specimen Description   Final    BLOOD RIGHT ASSIST CONTROL Performed at [facility], [address] [place], [place] [address]    Special Requests    Final    BOTTLES DRAWN AEROBIC AND ANAEROBIC Blood Culture adequate volume Performed at [facility], [address], [place], [place] [address]    Culture   Final    NO GROWTH 5 DAYS Performed at [facility], [place] [address], [place], [place] [address]    Report Status 04/19/2022 FINAL  Final  Resp panel by RT-PCR (RSV, Flu A&B, Covid) Anterior Nasal Swab     Status: None   Collection Time: 04/14/22  6:00 PM   Specimen: Anterior Nasal Swab  Result Value Ref Range Status   SARS Coronavirus 2 by RT PCR NEGATIVE NEGATIVE Final    Comment: (NOTE) SARS-CoV-2 target nucleic acids are NOT DETECTED.  The SARS-CoV-2 RNA is generally detectable in upper respiratory specimens during the acute phase of infection. The lowest concentration of SARS-CoV-2 viral copies this assay can detect is 138 copies/mL. A negative result does not preclude SARS-Cov-2 infection and should not be used as the sole basis for treatment or other patient management decisions. A negative result may occur with  improper specimen collection/handling, submission of specimen other than nasopharyngeal swab, presence  of viral mutation(s) within the areas targeted by this assay, and inadequate number of viral copies(<138 copies/mL). A negative result must be combined with clinical observations, patient history, and epidemiological information. The expected result is Negative.  Fact Sheet for Patients:  EntrepreneurPulse.com.au  Fact Sheet for Healthcare Providers:  IncredibleEmployment.be  This test is no t yet approved or cleared by the [place] FDA and  has been authorized for detection and/or diagnosis of SARS-CoV-2 by FDA under an Emergency Use Authorization (EUA). This EUA will remain  in effect (meaning this test can be used) for the duration of the COVID-19 declaration under Section 564(b)(1) of the Act, 21 U.S.C.section 360bbb-3(b)(1), unless the authorization is  terminated  or revoked sooner.       Influenza A by PCR NEGATIVE NEGATIVE Final   Influenza B by PCR NEGATIVE NEGATIVE Final    Comment: (NOTE) The Xpert Xpress SARS-CoV-2/FLU/RSV plus assay is intended as an aid in the diagnosis of influenza from Nasopharyngeal swab specimens and should not be used as a sole basis for treatment. Nasal washings and aspirates are unacceptable for Xpert Xpress SARS-CoV-2/FLU/RSV testing.  Fact Sheet for Patients: EntrepreneurPulse.com.au  Fact Sheet for Healthcare Providers: IncredibleEmployment.be  This test is not yet approved or cleared by the [place] FDA and has been authorized for detection and/or diagnosis of SARS-CoV-2 by FDA under an Emergency Use Authorization (EUA). This EUA will remain in effect (meaning this test can be used) for the duration of the COVID-19 declaration under Section 564(b)(1) of the Act, 21 U.S.C. section 360bbb-3(b)(1), unless the authorization is terminated or revoked.     Resp Syncytial Virus by PCR NEGATIVE NEGATIVE Final    Comment: (NOTE) Fact Sheet for Patients: EntrepreneurPulse.com.au  Fact Sheet for Healthcare Providers: IncredibleEmployment.be  This test is not yet approved or cleared by the [place] FDA and has been authorized for detection and/or diagnosis of SARS-CoV-2 by FDA under an Emergency Use Authorization (EUA). This EUA will remain in effect (meaning this test can be used) for the duration of the COVID-19 declaration under Section 564(b)(1) of the Act, 21 U.S.C. section 360bbb-3(b)(1), unless the authorization is terminated or revoked.  Performed at [facility], [address] [place], [place] [address]   MRSA Next Gen by PCR, Nasal     Status: None   Collection Time: 04/14/22  6:53 PM   Specimen: Nasal Mucosa; Nasal Swab  Result Value Ref Range Status   MRSA by PCR Next Gen NOT DETECTED NOT DETECTED  Final    Comment: (NOTE) The GeneXpert MRSA Assay (FDA approved for NASAL specimens only), is one component of a comprehensive MRSA colonization surveillance program. It is not intended to diagnose MRSA infection nor to guide or monitor treatment for MRSA infections. Test performance is not FDA approved in patients less than 79 years old. Performed at [facility], [address], [place], [place] [address]   C Difficile Quick Screen (NO PCR Reflex)     Status: None   Collection Time: 04/18/22  4:52 AM   Specimen: STOOL  Result Value Ref Range Status   C Diff antigen NEGATIVE NEGATIVE Final   C Diff toxin NEGATIVE NEGATIVE Final   C Diff interpretation No C. difficile detected.  Final    Comment: Performed at [facility], [address] [place], [place] [address]    Scheduled Meds:  amoxicillin-clavulanate  1 tablet Oral BID   buPROPion  150 mg Oral q AM   busPIRone  10 mg Oral  BID   Chlorhexidine Gluconate Cloth  6 each Topical Daily   dextromethorphan-guaiFENesin  1 tablet Oral BID   pantoprazole (PROTONIX) IV  40 mg Intravenous Q12H   saccharomyces boulardii  250 mg Oral BID   simvastatin  40 mg Oral q1800   sodium bicarbonate  650 mg Oral TID   sodium chloride flush  10-40 mL Intracatheter Q12H   Continuous Infusions:  heparin 1,300 Units/hr (04/22/22 1705)   Procedures/Studies: CT HEAD WO CONTRAST (5MM)  Result Date: 04/21/2022 CLINICAL DATA:  Neuro deficit.  Stroke suspected. EXAM: CT HEAD WITHOUT CONTRAST TECHNIQUE: Contiguous axial images were obtained from the base of the skull through the vertex without intravenous contrast. RADIATION DOSE REDUCTION: This exam was performed according to the departmental dose-optimization program which includes automated exposure control, adjustment of the mA and/or kV according to patient size and/or use of iterative reconstruction technique. COMPARISON:  CT Brain 09/23/21 FINDINGS: Brain: No evidence of acute infarction, hemorrhage,  hydrocephalus, extra-axial collection or mass lesion/mass effect. Sequela of moderate to severe chronic microvascular ischemic change. Chronic small right cerebellar infarct. Vascular: No hyperdense vessel or unexpected calcification. Skull: Normal. Negative for fracture or focal lesion. Sinuses/Orbits: Trace right mastoid effusion. No middle ear effusion. There is impacted cerumen in the left EAC. Right lens replacement. Redemonstrated mucosal thickening in the right maxillary sinus and dense inspissated secretions or chronic fungal colonization. Other: None IMPRESSION: 1. No hemorrhage or CT evidence of an acute cortical infarct. 2. Sequela of moderate to severe chronic microvascular ischemic change. Chronic small right cerebellar infarct. Electronically Signed   By: Marin Roberts M.D.   On: 04/21/2022 15:27   DG CHEST PORT 1 VIEW  Result Date: 04/20/2022 CLINICAL DATA:  Aspiration. EXAM: PORTABLE CHEST 1 VIEW COMPARISON:  04/14/2022 FINDINGS: Right arm PICC line tip terminates in the distal SVC. Previous median sternotomy. Stable mild cardiac enlargement. Persistent small bilateral pleural effusions, right greater than left. New bilateral lower lobe zone peribronchovascular opacities are identified along with scattered areas of subsegmental atelectasis. IMPRESSION: 1. New bilateral lower lobe peribronchovascular opacities compatible with aspiration. 2. Persistent small bilateral pleural effusions, right greater than left. Electronically Signed   By: Kerby Moors M.D.   On: 04/20/2022 09:44   [place] EKG SITE RITE  Result Date: 04/15/2022 If Site Rite image not attached, placement could not be confirmed due to current cardiac rhythm.  CT Angio Abd/Pel w/ and/or w/o  Result Date: 04/14/2022 CLINICAL DATA:  Retroperitoneal bleed suspected, extraperitoneal bleed. EXAM: CTA ABDOMEN AND PELVIS WITHOUT AND WITH CONTRAST TECHNIQUE: Multidetector CT imaging of the abdomen and pelvis was performed using the  standard protocol during bolus administration of intravenous contrast. Multiplanar reconstructed images and MIPs were obtained and reviewed to evaluate the vascular anatomy. RADIATION DOSE REDUCTION: This exam was performed according to the departmental dose-optimization program which includes automated exposure control, adjustment of the mA and/or kV according to patient size and/or use of iterative reconstruction technique. CONTRAST:  40mL OMNIPAQUE IOHEXOL 350 MG/ML SOLN COMPARISON:  Noncontrast CT earlier today. FINDINGS: VASCULAR Aorta: Moderate atherosclerosis. No aneurysm, dissection or acute findings. Celiac: Calcified plaque at the origin causes mild stenosis. Distal branch vessels are patent. No aneurysm, dissection or acute findings. SMA: Mild diffuse atheromatous plaque with mild areas of stenosis. No aneurysm or acute findings. Renals: Mild plaque in the proximal renal arteries causing varying degrees of stenosis up to moderate. No aneurysm or acute findings. IMA: Patent. Inflow: Mild atherosclerosis without acute findings. Proximal Outflow: Bilateral common femoral  and visualized portions of the superficial and profunda femoral arteries are patent without evidence of aneurysm, dissection, vasculitis or significant stenosis. Retroperitoneal and extraperitoneal hematomas. There is no active arterial extravasation within any of the retroperitoneal or extraperitoneal bleed. Veins: Venous phase imaging demonstrates patency of the iliac veins and IVC. Portal, splenic and mesenteric veins are patent. No areas of active extravasation or seen on venous phase. Review of the MIP images confirms the above findings. NON-VASCULAR Lower chest: Right pleural effusion with pleural enhancement, unchanged from earlier today. Hepatobiliary: Nodular hepatic contours typical of cirrhosis. Water density there is a 13 mm low-density lesion in the left lobe of the liver series 12, image 21, measuring slightly higher than  simple fluid density, nonspecific. Multiple gallstones without evidence of gallbladder inflammation. Pancreas: Parenchymal atrophy. No ductal dilatation or inflammation. Spleen: No splenomegaly. Adrenals/Urinary Tract: No adrenal nodule or hemorrhage. Bilateral renal parenchymal atrophy with multifocal areas of parenchymal scarring. Parenchymal calcifications in the upper pole the right kidney. No hydronephrosis. Displaced to the right due to left pelvic fluid collections. The bladder appears thick walled which is nonspecific. Stomach/Bowel: No bowel obstruction or inflammation. There is mild colonic distension with stool proximally and air distally. The appendix is not seen. Lymphatic: No gross adenopathy. Reproductive: Uterine vascular calcifications again seen. Other: Again seen multiple areas of presume extraperitoneal and retroperitoneal hematoma/hemorrhage. Largest area is in the left pelvis measuring approximately 12.9 x 7 cm with hematocrit level, measured on series 12, image 78. This may be contiguous with the left rectus abdominus musculature. More superiorly is an area of presumed rectus hematoma measuring 6.1 x 5.1 cm, measured on series 12, image 67. There is stranding in the left retroperitoneum tracking anterior to the iliopsoas muscle that is primarily ill-defined. Stranding within the right extraperitoneal M with ill-defined hemorrhage. Fluid collections within the upper abdominal wall musculature just underneath the anterior ribs, 8.7 x 3.3 cm on the left series 12, image 30, 4.0 x 2.7 cm on the right series 12, image 31. There is no evidence of active extravasation within any of these hematomas. There is edema within the subcutaneous tissues of the left flank/abdominal wall as well as anterior to the lower abdominal wall musculature. Musculoskeletal: Unchanged from earlier today with L4 compression deformity. Left hip arthroplasty. IMPRESSION: 1. No evidence of active extravasation within any of  the retroperitoneal or extraperitoneal fluid collections/presumed hematoma. 2. Multiple areas of presume extraperitoneal and retroperitoneal hematoma/hemorrhage, largest area in the left pelvis measuring 12.9 x 7 cm, likely contiguous with the left rectus abdominus musculature. Allowing for differences in caliper placement in technique, no significant change in size of these collections from earlier today. Possibility of infection is considered but felt less likely. 3. Cirrhosis. Indeterminate 13 mm low-density lesion in the left lobe of the liver measuring slightly higher than simple fluid density. Recommend further evaluation with MRI on a nonemergent basis after resolution of acute event. 4. Cholelithiasis without acute cholecystitis. 5. Aortic and branch atherosclerosis with areas of arterial stenosis up to moderate in degree, as described. 6. Additional chronic changes are stable from earlier today. Aortic Atherosclerosis (ICD10-I70.0). Electronically Signed   By: Keith Rake M.D.   On: 04/14/2022 16:41   CT CHEST ABDOMEN PELVIS WO CONTRAST  Result Date: 04/14/2022 CLINICAL DATA:  Generalized pain.  Sepsis.  Nausea. EXAM: CT CHEST, ABDOMEN AND PELVIS WITHOUT CONTRAST TECHNIQUE: Multidetector CT imaging of the chest, abdomen and pelvis was performed following the standard protocol without IV contrast. RADIATION DOSE REDUCTION:  This exam was performed according to the departmental dose-optimization program which includes automated exposure control, adjustment of the mA and/or kV according to patient size and/or use of iterative reconstruction technique. COMPARISON:  Chest x-ray earlier 04/14/2022. Older x-rays. CT scan abdomen pelvis 2017 July FINDINGS: CT CHEST FINDINGS Cardiovascular: Heart is enlarged. Trace pericardial fluid. Prominent calcifications along the mitral valve. Postop chest with prosthetic aortic valve. The thoracic aorta has scattered calcified atherosclerotic plaque. Diameter of the  ascending aorta at the level of the right pulmonary artery measures 4.3 by 4.0 cm. Mediastinum/Nodes: Small thyroid gland. No specific abnormal lymph node enlargement present in the axillary region, hila or mediastinum on this noncontrast exam. Prominent subcarinal node is noted measuring 17 by 10 mm on series 2, image 30. Normal caliber thoracic esophagus. Lungs/Pleura: Diffuse breathing motion. Small right-sided pleural effusion identified with some adjacent parenchymal opacities. Associated pleural thickening. This effusion was seen in 2017 and with slightly larger that time. More thickening today of the pleura. Atelectasis versus infiltrate. There is some bandlike areas of opacity as well in the middle lobe. Scattered areas of interstitial septal thickening are identified. There are few patchy ground-glass areas as well in the right upper lobe towards the apex such as series 3, image 42, series 3, image 53. Additional areas scattered in the left upper lobe, middle lobe and superior segment of left lower lobe. This has a differential including atypical or viral process. Recommend follow-up. Musculoskeletal: Osteopenia. Scattered degenerative changes noted along the spine. Of note the patient's arms were scanned at the patient's side. CT ABDOMEN PELVIS FINDINGS Hepatobiliary: Nodular contour to the liver. Please correlate for any known history of chronic liver disease. There are multiple stones in the nondilated gallbladder. Pancreas: Moderate pancreatic atrophy. No obvious mass. Appearance is similar to prior. Spleen: Spleen is grossly nonenlarged. Adrenals/Urinary Tract: Adrenal glands are grossly preserved. There is mild bilateral renal atrophy with some focal areas along the midportion left kidney laterally. Some calcifications noted along the hilum of the right kidney which very well could be vascular. No definite ureteral or renal stones otherwise. The bladder is somewhat decompressed with wall thickening  but there is mass effect along the bladder from the adjacent fluid collections. Stomach/Bowel: Stomach is mildly distended with fluid. The small bowel is nondilated. Large bowel is nondilated prominent right-sided colonic stool. Again evaluation is limited by significant motion. Vascular/Lymphatic: Diffuse vascular calcifications are identified with areas of significant stenosis suggested along branch vessels including SMA, renal arteries. Normal caliber IVC. Reproductive: Scattered calcifications along the uterus. These could be vascular. No separate adnexal mass. Other: Significant motion noted limited evaluation. Extensive areas of presumed hematoma identified. These include the rectus abdominis muscles bilaterally, left-greater-than-right. The left hematoma extends along the course of the entirety of the rectus abdominis muscle, cephalocaudal length approaching 29 cm. Transverse dimension at the level of the pelvis approximate 7.4 by 4.8 cm. Again the right-sided areas are smaller and are along the lower chest and upper abdomen regions. There is also a large extraperitoneal hematoma more on the left than on the right with mass effect along surrounding structures including the bladder. This extends along the margin of the iliacus muscle. Longitudinal length on coronal image 89 of series 5 measures a proximally 18.2 cm. Axial dimension maximally approaches 10.5 by 5.5 cm. Anasarca. Musculoskeletal: Streak artifact related to patient's left hip arthroplasty obscuring the surrounding tissues. Curvature and degenerative changes seen along the spine. Osteopenia. Multilevel stenosis identified along the lumbar spine. There  is moderate compression of the superior endplate of L4 which was not seen in 2017 per the margins are somewhat sclerotic. Favor a subacute to chronic process. Please correlate with history. There is multilevel disc bulging. Degenerative changes seen along the pelvis. Critical Value/emergent results  were called by telephone at the time of interpretation on 04/14/2022 at 1:59 pm to provider DAVID TAT , who verbally acknowledged these results. IMPRESSION: 1. Large areas of presumed hematoma involving the rectus abdominis muscles bilaterally, left-greater-than-right. There is also large extraperitoneal hematoma along the margin of the iliopsoas muscle on the left. This extends along the margin of the bladder with mass effect. 2. Small right-sided pleural effusion with adjacent parenchymal opacities. This effusion was seen in 2017 and with slightly larger that time. More thickening of the pleura today. Atelectasis versus infiltrate. 3. Patchy areas of ground-glass in both lungs. This has a differential including atypical or viral process. Recommend follow-up. 4. Cardiomegaly.  Postop chest. 5. Ascending aortic aneurysm measuring 4.3 x 4.0 cm. 6. Nodular contour to the liver. Please correlate for any known history of chronic liver disease. 7. Cholelithiasis. 8. Diffuse vascular calcifications with areas of significant stenosis along branch vessels including SMA, renal arteries. 9. Moderate compression of the superior endplate of L4, not seen in 2017 per the margins are somewhat sclerotic. Favor a subacute to chronic process. Please correlate with history. 10. Aortic atherosclerosis. Aortic Atherosclerosis (ICD10-I70.0). Electronically Signed   By: Jill Side M.D.   On: 04/14/2022 14:04   DG Chest 2 View  Result Date: 04/14/2022 CLINICAL DATA:  Fall EXAM: CHEST - 2 VIEW COMPARISON:  Chest x-ray dated September 23, 2021 FINDINGS: Cardiac and mediastinal contours are unchanged post median sternotomy. Left-greater-than-right pleural effusions. No evidence of pneumothorax. Chronic appearing anterior second right rib fracture. IMPRESSION: Left-greater-than-right pleural effusions bibasilar atelectasis, similar to prior. Electronically Signed   By: Yetta Glassman M.D.   On: 04/14/2022 10:04    Barton Dubois, MD  [facility]  If 7PM-7AM, please contact night-coverage [email or web address] Password [email or web address] 04/22/2022, 5:21 PM   LOS: 8 days

## 2022-04-23 DIAGNOSIS — Z515 Encounter for palliative care: Secondary | ICD-10-CM | POA: Diagnosis not present

## 2022-04-23 DIAGNOSIS — D6869 Other thrombophilia: Secondary | ICD-10-CM | POA: Diagnosis not present

## 2022-04-23 DIAGNOSIS — J690 Pneumonitis due to inhalation of food and vomit: Secondary | ICD-10-CM | POA: Diagnosis not present

## 2022-04-23 DIAGNOSIS — A419 Sepsis, unspecified organism: Secondary | ICD-10-CM | POA: Diagnosis not present

## 2022-04-23 LAB — CBC
HCT: 28.2 % — ABNORMAL LOW (ref 36.0–46.0)
Hemoglobin: 8.6 g/dL — ABNORMAL LOW (ref 12.0–15.0)
MCH: 32 pg (ref 26.0–34.0)
MCHC: 30.5 g/dL (ref 30.0–36.0)
MCV: 104.8 fL — ABNORMAL HIGH (ref 80.0–100.0)
Platelets: 114 10*3/uL — ABNORMAL LOW (ref 150–400)
RBC: 2.69 MIL/uL — ABNORMAL LOW (ref 3.87–5.11)
RDW: 19.3 % — ABNORMAL HIGH (ref 11.5–15.5)
WBC: 6.8 10*3/uL (ref 4.0–10.5)
nRBC: 0 % (ref 0.0–0.2)

## 2022-04-23 LAB — HEPARIN LEVEL (UNFRACTIONATED)
Heparin Unfractionated: 0.34 IU/mL (ref 0.30–0.70)
Heparin Unfractionated: 0.33 IU/mL (ref 0.30–0.70)

## 2022-04-23 MED ORDER — ORAL CARE MOUTH RINSE
15.0000 mL | OROMUCOSAL | Status: DC
  Administered 2022-04-23 – 2022-04-28 (×20): 15 mL via OROMUCOSAL

## 2022-04-23 MED ORDER — ORAL CARE MOUTH RINSE: 15.0000 mL | OROMUCOSAL | Status: DC | PRN

## 2022-04-23 MED ORDER — WARFARIN SODIUM 2 MG PO TABS
3.0000 mg | ORAL_TABLET | Freq: Once | ORAL | Status: AC
  Administered 2022-04-23: 3 mg via ORAL
  Filled 2022-04-23: qty 1

## 2022-04-23 MED ORDER — WARFARIN - PHARMACIST DOSING INPATIENT: Freq: Every day | Status: DC

## 2022-04-23 NOTE — Progress Notes (Signed)
ANTICOAGULATION CONSULT NOTE  Pharmacy Consult for Heparin Indication: mechanical aortic valve   Allergies  Allergen Reactions   Tape Itching    Redness, Please use "paper" tape only    Patient Measurements: Height: 5\' 7"  (170.2 cm) Weight: 66.5 kg (146 lb 9.7 oz) IBW/kg (Calculated) : 61.6 HEPARIN DW (KG): 63.7   Vital Signs: Temp: 97.8 F (36.6 C) (02/04 0000) Temp Source: Oral (02/04 0000) BP: 132/54 (02/04 0600) Pulse Rate: 91 (02/04 0600)  Labs: Recent Labs    04/22/22 0509 04/22/22 1430 04/22/22 1546 04/23/22 0144 04/23/22 0444 04/23/22 0719  HGB 8.7*  --   --   --  8.6*  --   HCT 28.2*  --   --   --  28.2*  --   PLT 114*  --   --   --  114*  --   HEPARINUNFRC 0.19*   < > 0.28* 0.34  --  0.33  CREATININE 1.16*  --   --   --   --   --    < > = values in this interval not displayed.     Estimated Creatinine Clearance: 35.1 mL/min (A) (by C-G formula based on SCr of 1.16 mg/dL (H)).   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   04/11/2022   buPROPion (WELLBUTRIN XL) 150 MG 24 hr tablet Take 150 mg by mouth in the morning.   04/13/2022   busPIRone (BUSPAR) 10 MG tablet Take 10 mg by mouth 2 (two) times daily.   04/13/2022   docusate sodium (COLACE) 100 MG capsule Take 1  capsule (100 mg total) by mouth 2 (two) times daily. 10 capsule 0 04/13/2022   ferrous sulfate 325 (65 FE) MG tablet Take 325 mg by mouth every other day.   04/12/2022   guaifenesin (HUMIBID E) 400 MG TABS tablet Take 400 mg by mouth every 8 (eight) hours as needed (cough/congestion).   04/13/2022   HYDROcodone-acetaminophen (NORCO) 10-325 MG tablet Take 1 tablet by mouth every 6 (six) hours as needed for severe pain. (Patient taking differently: Take 1 tablet by mouth in the morning and at bedtime.) 30 tablet 0 04/13/2022   Multiple Vitamins-Minerals (MULTIVITAMIN WITH MINERALS) tablet Take 1 tablet by mouth in the morning.   04/13/2022   pantoprazole (PROTONIX) 40 MG tablet Take 1 tablet (40 mg total) by mouth daily before breakfast. 30 tablet 5 04/14/2022   polyethylene glycol (MIRALAX / GLYCOLAX) 17 g packet Take 17 g by mouth daily.   04/13/2022   QUEtiapine (SEROQUEL) 25 MG tablet Take 25 mg by mouth at bedtime.   04/13/2022   simvastatin (ZOCOR) 40 MG tablet Take 40 mg  by mouth at bedtime.   04/13/2022   sodium bicarbonate 650 MG tablet Take 650 mg by mouth 3 (three) times daily.   04/13/2022   spironolactone (ALDACTONE) 50 MG tablet Take 50 mg by mouth in the morning.   04/13/2022   torsemide (DEMADEX) 20 MG tablet Take 20 mg by mouth 2 (two) times daily.   04/13/2022   warfarin (COUMADIN) 3 MG tablet Take 1 tablet (3 mg total) by mouth daily.   04/13/2022 at 2000   amoxicillin (AMOXIL) 500 MG capsule Take 2,000 mg by mouth See admin instructions. Take 2000mg  by mouth once daily for 1 day one hour pre-op.      spironolactone (ALDACTONE) 100 MG tablet Take 0.5 tablets (50 mg total) by mouth daily. (Patient not taking: Reported on 04/14/2022)   Not Taking    Assessment: 82 year old female with a history of mechanical aortic valve on warfarin presenting from [facility] with generalized weakness for 3 to 4 days . Patient had Abdominal pan and CT abdomen showed large areas of presumed hematoma. Patient with  extraperitoneal and retroperitoneal bleed. Now stabilized and resumed on anticoagulation conservatively on 2/2 with heparin for mechanical AVR. MD requests heparin level goal 0.3-0.5 with no boluses.   Heparin level 0.33, therapeutic. No bleeding noted Hgb 8.6, stable  Goal of Therapy:  Heparin level 0.3-0.5 units/ml Monitor platelets by anticoagulation protocol: Yes   Plan:  No heparin boluses Cont IV heparin at 1300 units/hr Check heparin level daily Monitor daily CBC, signs/symptoms of bleeding F/U plan for warfarin restart as appropriate  Isac Sarna, BS Pharm D, BCPS Clinical Pharmacist 04/23/2022 8:55 AM

## 2022-04-23 NOTE — Progress Notes (Signed)
PROGRESS NOTE  Mary [name] UUV:[id number] DOB: 10-Feb-1941 DOA: 04/14/2022 PCP: Pcp, No  Brief History:  82 year old female with a history of dementia, hypertension, hyperlipidemia, CKD stage III, anxiety, mechanical aortic valve, stroke, diastolic CHF, and anxiety presenting from [facility] with generalized weakness for 3 to 4 days.  She has had associated decreased oral intake and nausea.  The patient is a difficult historian.  The patient's spouse is at the bedside.  He is also a difficult historian.  Nevertheless, it appears that the patient was treated for pneumonia.  Review of the [facility] from [place] shows the patient was started on levofloxacin on 04/08/2022 for 5-day course.  Even with the antibiotics, the patient continued to have progressive generalized weakness.  She denies any headache, neck pain, chest pain, hemoptysis, vomiting, diarrhea.  She does complain of abdominal pain.  She states that the abdominal pain has been present for 3 to 4 days.  There is no hematochezia or melena.  She has some dyspnea with exertion. In the ED, the patient was afebrile hemodynamically stable with oxygen saturation 99% on room air.  WBC 10.8, hemoglobin 9.3, platelets 302,000.  Sodium 135, potassium 4.4, bicarbonate 24, serum creatinine 1.77.  The patient was started on IV ceftriaxone and azithromycin and IV fluids.  She was admitted for further evaluation and treatment.   Assessment/Plan: SIRS -concerned about intra-abdominal source of infection initially -CT abd--- Large areas of presumed hematoma involving the rectus abdominis muscles bilaterally, left-greater-than-right. There is also large extraperitoneal hematoma along the margin of the iliopsoas muscle on the left. -presented with leukocytosis, tachycardia, elevated lactate -follow blood culture--neg to date -UA neg for pyuria -Lactic acid peaked 2.2 -Continue IV fluids>>saline locked -Treated with IV Zosyn and subsequently transition  to Augmentin on 04/18/2022; planning for 3 more days of antibiotics; given second acute choking event with concern for reaspiration process. -Chest x-ray on 04/20/2022 demonstrating patchy infiltrates suggestive of aspiration. -sepsis ruled out.   Abdominal pain/extraperitoneal/retroperitoneal bleed/ Acute Blood Loss Anemia -CT abdomen and pelvis--- Large areas of presumed hematoma involving the rectus abdominismuscles bilaterally, left-greater-than-right. There is also largeextraperitoneal hematoma along the margin of the iliopsoas muscle on the left -Status post 8 units PRBC transfused throughout hospitalization. -Patient has also received fresh frozen plasma transfusion. -Continue fentanyl prn pain -Continue to follow hemoglobin trend. -Latest hemoglobin 8.6 -Continue to follow trend. -Continue to follow hemoglobin trend; planning to restart Coumadin.   Supratherapeutic INR -Likely secondary to the patient's recent use of levofloxacin and decreased oral intake in the setting of warfarin use -Vitamin K 20 mg total given for the admission -9 units FFP given  total (last one ordered 1/30) -currently subtherapeutic and stable. -Heparin drip started. -Hemoglobin stable; no signs of overt bleeding. -Will restart Coumadin.  Aiming for INR of 2.5.   Status post mechanical aortic valve replacement -Continue anticoagulation with INR goal of 2.5-3.5 -Continue holding warfarin temporarily secondary to supratherapeutic INR -Now that patient's hemoglobin has remained stable; will continue the use of heparin drip and restart Coumadin for INR goal of 2.5.   Aspiration pneumonitis -initially started on zosyn -Successfully transitioned to amox/clav>> planning for 1 more days to finish abx therapy. -Continue the use of Mucinex, flutter valve and as needed bronchodilator.   Essential hypertension -Vital signs stable -Continue to follow blood pressure trend and resume antihypertensive agents as  required.   Dementia without behavioral disturbance -At risk for hospital delirium -Continue constant reorientation.  Mixed hyperlipidemia -Resume the use of simvastatin at time of discharge. -Continue heart healthy diet.   Anxiety/depression -Continue BuSpar   Acute on chronic renal failure--CKD stage IV -Renal function has improved and back to baseline at this moment (creatinine 1.39) -due to hemodynamic changes, IV contrast -Good response to Lasix given; no crackles appreciated on examination.  Very still having signs of diffuse edema. -Continue to monitor daily basic metabolic panel. -Renal function has remained stable.   Thrombocytopenia -due to acute medical illness -Continue to follow platelet counts trend/stability.   Hypokalemia -In the setting of GI losses and decreased oral intake -Magnesium within normal limit -Continue to follow electrolytes trend and further replete as needed. -Stable overall.  Diarrhea -C. difficile PCR negative -Most likely triggered by the use of antibiotics -Continue supportive care. -Continue treatment with Florastor and as needed Imodium.  Dysphagia/weakness and deconditioning -CT scan not demonstrating not acute ischemic process -Most likely secondary to aspiration and vocal cord dysfunction -Continue supportive care -Continue to follow recommendations by speech therapy. -Physical therapy recommending skilled nursing facility for rehabilitation at discharge.   Goals of Care -confirmed with patient, daughter, spouse>>DNR -Continue current goals of care. -Physical therapy evaluation will be requested -Overall prognosis and future recovery guarded.      Family Communication:   Patient's husband updated at bedside 04/20/2022; patient's daughter updated over the phone 04/21/2022.   Consultants:  none   Code Status:  DNR   DVT Prophylaxis:  SCDs     Procedures: As Listed in Progress Note Above   Antibiotics: Zosyn  1/26>>1/29 Amox/clav 1/29>> Azithro 1/26>>1/28   Subjective: Oxygen supplementation down to 1.5 L; no using accessory muscles.  Overall is stable.  No overnight events.  No overt bleeding.   Objective: Vitals:   04/23/22 0600 04/23/22 0800 04/23/22 1000 04/23/22 1130  BP: (!) 132/54 (!) 108/55 (!) 151/91   Pulse: 91 92 87 86  Resp: 20 (!) 21 (!) 29 17  Temp:  97.9 F (36.6 C)  98.2 F (36.8 C)  TempSrc:  Oral  Oral  SpO2: 100% 97% 97%   Weight:      Height:        Intake/Output Summary (Last 24 hours) at 04/23/2022 1209 Last data filed at 04/23/2022 1028 Gross per 24 hour  Intake 964.29 ml  Output 900 ml  Net 64.29 ml   Weight change:   Exam: General exam: Alert, awake, oriented x 3; in no acute distress.  No overnight events.  Tolerating dysphagia 1 with nectar thick liquids.  Generalized weakness appreciated. Respiratory system: No use of accessory muscles. Cardiovascular system:RRR. No rubs or gallops. Gastrointestinal system: Abdomen is nondistended, soft and tender to palpation.  Increased abdominal girth appreciated. Central nervous system: No new focal neurological deficits. Extremities: No cyanosis or clubbing; 1+ edema appreciated bilaterally in all 4 limbs. Skin: No petechiae.  Reabsorbing hematoma/process in her flank area. Psychiatry: Judgement and insight appear normal. Mood & affect appropriate.   Data Reviewed: I have personally reviewed following labs and imaging studies  Basic Metabolic Panel: Recent Labs  Lab 04/17/22 0416 04/18/22 0433 04/19/22 0421 04/20/22 0421 04/22/22 0509  NA 136 138 137 136 138  K 3.0* 3.5 3.1* 3.6 3.8  CL 104 107 106 102 106  CO2 23 21* 26 25 26   GLUCOSE 129* 144* 92 116* 103*  BUN 52* 46* 40* 36* 33*  CREATININE 2.06* 1.80* 1.47* 1.39* 1.16*  CALCIUM 7.4* 8.2* 8.3* 8.4* 8.4*  MG  --   --  2.2  --   --    Liver Function Tests: No results for input(s): "AST", "ALT", "ALKPHOS", "BILITOT", "PROT", "ALBUMIN" in the  last 168 hours.  Coagulation Profile: Recent Labs  Lab 04/17/22 0416 04/18/22 0748 04/19/22 0421  INR 1.8* 1.7* 1.6*   CBC: Recent Labs  Lab 04/18/22 0706 04/19/22 0421 04/20/22 0421 04/22/22 0509 04/23/22 0444  WBC 10.7* 8.4 8.2 8.5 6.8  HGB 9.9* 9.4* 9.5* 8.7* 8.6*  HCT 29.6* 29.0* 30.2* 28.2* 28.2*  MCV 94.9 98.6 101.3* 103.7* 104.8*  PLT 119* 110* 109* 114* 114*   Urine analysis:    Component Value Date/Time   COLORURINE YELLOW 04/14/2022 2200   APPEARANCEUR CLEAR 04/14/2022 2200   LABSPEC >1.046 (H) 04/14/2022 2200   PHURINE 5.0 04/14/2022 2200   GLUCOSEU NEGATIVE 04/14/2022 2200   HGBUR NEGATIVE 04/14/2022 2200   BILIRUBINUR NEGATIVE 04/14/2022 2200   KETONESUR NEGATIVE 04/14/2022 2200   PROTEINUR NEGATIVE 04/14/2022 2200   UROBILINOGEN 2.0 (H) 11/01/2007 1518   NITRITE NEGATIVE 04/14/2022 2200   LEUKOCYTESUR NEGATIVE 04/14/2022 2200   Sepsis Labs:  Recent Results (from the past 240 hour(s))  Blood culture (routine x 2)     Status: None   Collection Time: 04/14/22 10:30 AM   Specimen: BLOOD  Result Value Ref Range Status   Specimen Description   Final    BLOOD LEFT ASSIST CONTROL Performed at [facility], [address] [place], [place] [address]    Special Requests   Final    BOTTLES DRAWN AEROBIC AND ANAEROBIC Blood Culture adequate volume Performed at [facility], [address] [place], [place] [address]    Culture   Final    NO GROWTH 5 DAYS Performed at [facility], [place] [address], [place], [place] [address]    Report Status 04/19/2022 FINAL  Final  Blood culture (routine x 2)     Status: None   Collection Time: 04/14/22 10:42 AM   Specimen: BLOOD  Result Value Ref Range Status   Specimen Description   Final    BLOOD RIGHT ASSIST CONTROL Performed at [facility], [address] [place], [place] [address]    Special Requests   Final    BOTTLES DRAWN AEROBIC AND ANAEROBIC Blood Culture adequate volume Performed at [facility], [address], [place], [place] [address]    Culture   Final    NO GROWTH 5 DAYS Performed at [facility], [place] [address], [place], [place] [address]    Report Status 04/19/2022 FINAL  Final  Resp panel by RT-PCR (RSV, Flu A&B, Covid) Anterior Nasal Swab     Status: None   Collection Time: 04/14/22  6:00 PM   Specimen: Anterior Nasal Swab  Result Value Ref Range Status   SARS Coronavirus 2 by RT PCR NEGATIVE NEGATIVE Final    Comment: (NOTE) SARS-CoV-2 target nucleic acids are NOT DETECTED.  The SARS-CoV-2 RNA is generally detectable in upper respiratory specimens during the acute phase of infection. The lowest concentration of SARS-CoV-2 viral copies this assay can detect is 138 copies/mL. A negative result does not preclude SARS-Cov-2 infection and should not be used as the sole basis for treatment or other patient management decisions. A negative result may occur with  improper specimen collection/handling, submission of specimen other than nasopharyngeal swab, presence of viral mutation(s) within the areas targeted by this assay, and inadequate number of viral copies(<138 copies/mL). A negative result must be combined with clinical observations, patient history, and  epidemiological information. The expected result is Negative.  Fact Sheet for Patients:  EntrepreneurPulse.com.au  Fact Sheet for Healthcare Providers:  IncredibleEmployment.be  This test is no t yet approved or cleared by the [place] FDA and  has been authorized for detection and/or diagnosis of SARS-CoV-2 by FDA under an Emergency Use Authorization (EUA). This EUA will remain  in effect (meaning this test can be used) for the duration of the COVID-19 declaration under Section 564(b)(1) of the Act, 21 U.S.C.section 360bbb-3(b)(1), unless the authorization is terminated  or revoked sooner.       Influenza A by PCR NEGATIVE NEGATIVE Final   Influenza B by  PCR NEGATIVE NEGATIVE Final    Comment: (NOTE) The Xpert Xpress SARS-CoV-2/FLU/RSV plus assay is intended as an aid in the diagnosis of influenza from Nasopharyngeal swab specimens and should not be used as a sole basis for treatment. Nasal washings and aspirates are unacceptable for Xpert Xpress SARS-CoV-2/FLU/RSV testing.  Fact Sheet for Patients: EntrepreneurPulse.com.au  Fact Sheet for Healthcare Providers: IncredibleEmployment.be  This test is not yet approved or cleared by the [place] FDA and has been authorized for detection and/or diagnosis of SARS-CoV-2 by FDA under an Emergency Use Authorization (EUA). This EUA will remain in effect (meaning this test can be used) for the duration of the COVID-19 declaration under Section 564(b)(1) of the Act, 21 U.S.C. section 360bbb-3(b)(1), unless the authorization is terminated or revoked.     Resp Syncytial Virus by PCR NEGATIVE NEGATIVE Final    Comment: (NOTE) Fact Sheet for Patients: EntrepreneurPulse.com.au  Fact Sheet for Healthcare Providers: IncredibleEmployment.be  This test is not yet approved or cleared by the [place] FDA and has been authorized for detection and/or diagnosis of SARS-CoV-2 by FDA under an Emergency Use Authorization (EUA). This EUA will remain in effect (meaning this test can be used) for the duration of the COVID-19 declaration under Section 564(b)(1) of the Act, 21 U.S.C. section 360bbb-3(b)(1), unless the authorization is terminated or revoked.  Performed at [facility], [address], [place], [place] [address]   MRSA Next Gen by PCR, Nasal     Status: None   Collection Time: 04/14/22  6:53 PM   Specimen: Nasal Mucosa; Nasal Swab  Result Value Ref Range Status   MRSA by PCR Next Gen NOT DETECTED NOT DETECTED Final    Comment: (NOTE) The GeneXpert MRSA Assay (FDA approved for NASAL specimens only), is one  component of a comprehensive MRSA colonization surveillance program. It is not intended to diagnose MRSA infection nor to guide or monitor treatment for MRSA infections. Test performance is not FDA approved in patients less than 42 years old. Performed at [facility], [address], [place], [place] [address]   C Difficile Quick Screen (NO PCR Reflex)     Status: None   Collection Time: 04/18/22  4:52 AM   Specimen: STOOL  Result Value Ref Range Status   C Diff antigen NEGATIVE NEGATIVE Final   C Diff toxin NEGATIVE NEGATIVE Final   C Diff interpretation No C. difficile detected.  Final    Comment: Performed at [facility], [address] [place],  [address]    Scheduled Meds:  buPROPion  150 mg Oral q AM   busPIRone  10 mg Oral BID   Chlorhexidine Gluconate Cloth  6 each Topical Daily   dextromethorphan-guaiFENesin  1 tablet Oral BID   mouth rinse  15 mL Mouth Rinse 4 times per day   pantoprazole (PROTONIX) IV  40 mg Intravenous Q12H   saccharomyces boulardii  250 mg Oral BID   simvastatin  40 mg Oral q1800   sodium bicarbonate  650 mg Oral TID   sodium chloride flush  10-40 mL Intracatheter Q12H   warfarin  3 mg Oral ONCE-1600   Warfarin - Pharmacist Dosing Inpatient   Does not apply q1600   Continuous Infusions:  heparin 1,300 Units/hr (04/23/22 0800)   Procedures/Studies: CT HEAD WO CONTRAST (5MM)  Result Date: 04/21/2022 CLINICAL DATA:  Neuro deficit.  Stroke suspected. EXAM: CT HEAD WITHOUT CONTRAST TECHNIQUE: Contiguous axial images were obtained from the base of the skull through the vertex without intravenous contrast. RADIATION DOSE REDUCTION: This exam was performed according to the departmental dose-optimization program which includes automated exposure control, adjustment of the mA and/or kV according to patient size and/or use of iterative reconstruction technique. COMPARISON:  CT Brain 09/23/21 FINDINGS: Brain: No evidence of acute infarction, hemorrhage,  hydrocephalus, extra-axial collection or mass lesion/mass effect. Sequela of moderate to severe chronic microvascular ischemic change. Chronic small right cerebellar infarct. Vascular: No hyperdense vessel or unexpected calcification. Skull: Normal. Negative for fracture or focal lesion. Sinuses/Orbits: Trace right mastoid effusion. No middle ear effusion. There is impacted cerumen in the left EAC. Right lens replacement. Redemonstrated mucosal thickening in the right maxillary sinus and dense inspissated secretions or chronic fungal colonization. Other: None IMPRESSION: 1. No hemorrhage or CT evidence of an acute cortical infarct. 2. Sequela of moderate to severe chronic microvascular ischemic change. Chronic small right cerebellar infarct. Electronically Signed   By: Marin Roberts M.D.   On: 04/21/2022 15:27   DG CHEST PORT 1 VIEW  Result Date: 04/20/2022 CLINICAL DATA:  Aspiration. EXAM: PORTABLE CHEST 1 VIEW COMPARISON:  04/14/2022 FINDINGS: Right arm PICC line tip terminates in the distal SVC. Previous median sternotomy. Stable mild cardiac enlargement. Persistent small bilateral pleural effusions, right greater than left. New bilateral lower lobe zone peribronchovascular opacities are identified along with scattered areas of subsegmental atelectasis. IMPRESSION: 1. New bilateral lower lobe peribronchovascular opacities compatible with aspiration. 2. Persistent small bilateral pleural effusions, right greater than left. Electronically Signed   By: Kerby Moors M.D.   On: 04/20/2022 09:44   [place] EKG SITE RITE  Result Date: 04/15/2022 If Site Rite image not attached, placement could not be confirmed due to current cardiac rhythm.  CT Angio Abd/Pel w/ and/or w/o  Result Date: 04/14/2022 CLINICAL DATA:  Retroperitoneal bleed suspected, extraperitoneal bleed. EXAM: CTA ABDOMEN AND PELVIS WITHOUT AND WITH CONTRAST TECHNIQUE: Multidetector CT imaging of the abdomen and pelvis was performed using the  standard protocol during bolus administration of intravenous contrast. Multiplanar reconstructed images and MIPs were obtained and reviewed to evaluate the vascular anatomy. RADIATION DOSE REDUCTION: This exam was performed according to the departmental dose-optimization program which includes automated exposure control, adjustment of the mA and/or kV according to patient size and/or use of iterative reconstruction technique. CONTRAST:  42mL OMNIPAQUE IOHEXOL 350 MG/ML SOLN COMPARISON:  Noncontrast CT earlier today. FINDINGS: VASCULAR Aorta: Moderate atherosclerosis. No aneurysm, dissection or acute findings. Celiac: Calcified plaque at the origin causes mild stenosis. Distal branch vessels are patent. No aneurysm, dissection or acute findings. SMA: Mild diffuse atheromatous plaque with mild areas of stenosis. No aneurysm or acute findings. Renals: Mild plaque in the proximal renal arteries causing varying degrees of stenosis up to moderate. No aneurysm or acute findings. IMA: Patent. Inflow: Mild atherosclerosis without acute findings. Proximal Outflow: Bilateral common femoral and visualized portions of  the superficial and profunda femoral arteries are patent without evidence of aneurysm, dissection, vasculitis or significant stenosis. Retroperitoneal and extraperitoneal hematomas. There is no active arterial extravasation within any of the retroperitoneal or extraperitoneal bleed. Veins: Venous phase imaging demonstrates patency of the iliac veins and IVC. Portal, splenic and mesenteric veins are patent. No areas of active extravasation or seen on venous phase. Review of the MIP images confirms the above findings. NON-VASCULAR Lower chest: Right pleural effusion with pleural enhancement, unchanged from earlier today. Hepatobiliary: Nodular hepatic contours typical of cirrhosis. Water density there is a 13 mm low-density lesion in the left lobe of the liver series 12, image 21, measuring slightly higher than  simple fluid density, nonspecific. Multiple gallstones without evidence of gallbladder inflammation. Pancreas: Parenchymal atrophy. No ductal dilatation or inflammation. Spleen: No splenomegaly. Adrenals/Urinary Tract: No adrenal nodule or hemorrhage. Bilateral renal parenchymal atrophy with multifocal areas of parenchymal scarring. Parenchymal calcifications in the upper pole the right kidney. No hydronephrosis. Displaced to the right due to left pelvic fluid collections. The bladder appears thick walled which is nonspecific. Stomach/Bowel: No bowel obstruction or inflammation. There is mild colonic distension with stool proximally and air distally. The appendix is not seen. Lymphatic: No gross adenopathy. Reproductive: Uterine vascular calcifications again seen. Other: Again seen multiple areas of presume extraperitoneal and retroperitoneal hematoma/hemorrhage. Largest area is in the left pelvis measuring approximately 12.9 x 7 cm with hematocrit level, measured on series 12, image 78. This may be contiguous with the left rectus abdominus musculature. More superiorly is an area of presumed rectus hematoma measuring 6.1 x 5.1 cm, measured on series 12, image 67. There is stranding in the left retroperitoneum tracking anterior to the iliopsoas muscle that is primarily ill-defined. Stranding within the right extraperitoneal M with ill-defined hemorrhage. Fluid collections within the upper abdominal wall musculature just underneath the anterior ribs, 8.7 x 3.3 cm on the left series 12, image 30, 4.0 x 2.7 cm on the right series 12, image 31. There is no evidence of active extravasation within any of these hematomas. There is edema within the subcutaneous tissues of the left flank/abdominal wall as well as anterior to the lower abdominal wall musculature. Musculoskeletal: Unchanged from earlier today with L4 compression deformity. Left hip arthroplasty. IMPRESSION: 1. No evidence of active extravasation within any of  the retroperitoneal or extraperitoneal fluid collections/presumed hematoma. 2. Multiple areas of presume extraperitoneal and retroperitoneal hematoma/hemorrhage, largest area in the left pelvis measuring 12.9 x 7 cm, likely contiguous with the left rectus abdominus musculature. Allowing for differences in caliper placement in technique, no significant change in size of these collections from earlier today. Possibility of infection is considered but felt less likely. 3. Cirrhosis. Indeterminate 13 mm low-density lesion in the left lobe of the liver measuring slightly higher than simple fluid density. Recommend further evaluation with MRI on a nonemergent basis after resolution of acute event. 4. Cholelithiasis without acute cholecystitis. 5. Aortic and branch atherosclerosis with areas of arterial stenosis up to moderate in degree, as described. 6. Additional chronic changes are stable from earlier today. Aortic Atherosclerosis (ICD10-I70.0). Electronically Signed   By: Keith Rake M.D.   On: 04/14/2022 16:41   CT CHEST ABDOMEN PELVIS WO CONTRAST  Result Date: 04/14/2022 CLINICAL DATA:  Generalized pain.  Sepsis.  Nausea. EXAM: CT CHEST, ABDOMEN AND PELVIS WITHOUT CONTRAST TECHNIQUE: Multidetector CT imaging of the chest, abdomen and pelvis was performed following the standard protocol without IV contrast. RADIATION DOSE REDUCTION: This exam was performed  according to the departmental dose-optimization program which includes automated exposure control, adjustment of the mA and/or kV according to patient size and/or use of iterative reconstruction technique. COMPARISON:  Chest x-ray earlier 04/14/2022. Older x-rays. CT scan abdomen pelvis 2017 July FINDINGS: CT CHEST FINDINGS Cardiovascular: Heart is enlarged. Trace pericardial fluid. Prominent calcifications along the mitral valve. Postop chest with prosthetic aortic valve. The thoracic aorta has scattered calcified atherosclerotic plaque. Diameter of the  ascending aorta at the level of the right pulmonary artery measures 4.3 by 4.0 cm. Mediastinum/Nodes: Small thyroid gland. No specific abnormal lymph node enlargement present in the axillary region, hila or mediastinum on this noncontrast exam. Prominent subcarinal node is noted measuring 17 by 10 mm on series 2, image 30. Normal caliber thoracic esophagus. Lungs/Pleura: Diffuse breathing motion. Small right-sided pleural effusion identified with some adjacent parenchymal opacities. Associated pleural thickening. This effusion was seen in 2017 and with slightly larger that time. More thickening today of the pleura. Atelectasis versus infiltrate. There is some bandlike areas of opacity as well in the middle lobe. Scattered areas of interstitial septal thickening are identified. There are few patchy ground-glass areas as well in the right upper lobe towards the apex such as series 3, image 42, series 3, image 53. Additional areas scattered in the left upper lobe, middle lobe and superior segment of left lower lobe. This has a differential including atypical or viral process. Recommend follow-up. Musculoskeletal: Osteopenia. Scattered degenerative changes noted along the spine. Of note the patient's arms were scanned at the patient's side. CT ABDOMEN PELVIS FINDINGS Hepatobiliary: Nodular contour to the liver. Please correlate for any known history of chronic liver disease. There are multiple stones in the nondilated gallbladder. Pancreas: Moderate pancreatic atrophy. No obvious mass. Appearance is similar to prior. Spleen: Spleen is grossly nonenlarged. Adrenals/Urinary Tract: Adrenal glands are grossly preserved. There is mild bilateral renal atrophy with some focal areas along the midportion left kidney laterally. Some calcifications noted along the hilum of the right kidney which very well could be vascular. No definite ureteral or renal stones otherwise. The bladder is somewhat decompressed with wall thickening  but there is mass effect along the bladder from the adjacent fluid collections. Stomach/Bowel: Stomach is mildly distended with fluid. The small bowel is nondilated. Large bowel is nondilated prominent right-sided colonic stool. Again evaluation is limited by significant motion. Vascular/Lymphatic: Diffuse vascular calcifications are identified with areas of significant stenosis suggested along branch vessels including SMA, renal arteries. Normal caliber IVC. Reproductive: Scattered calcifications along the uterus. These could be vascular. No separate adnexal mass. Other: Significant motion noted limited evaluation. Extensive areas of presumed hematoma identified. These include the rectus abdominis muscles bilaterally, left-greater-than-right. The left hematoma extends along the course of the entirety of the rectus abdominis muscle, cephalocaudal length approaching 29 cm. Transverse dimension at the level of the pelvis approximate 7.4 by 4.8 cm. Again the right-sided areas are smaller and are along the lower chest and upper abdomen regions. There is also a large extraperitoneal hematoma more on the left than on the right with mass effect along surrounding structures including the bladder. This extends along the margin of the iliacus muscle. Longitudinal length on coronal image 89 of series 5 measures a proximally 18.2 cm. Axial dimension maximally approaches 10.5 by 5.5 cm. Anasarca. Musculoskeletal: Streak artifact related to patient's left hip arthroplasty obscuring the surrounding tissues. Curvature and degenerative changes seen along the spine. Osteopenia. Multilevel stenosis identified along the lumbar spine. There is moderate compression of  the superior endplate of L4 which was not seen in 2017 per the margins are somewhat sclerotic. Favor a subacute to chronic process. Please correlate with history. There is multilevel disc bulging. Degenerative changes seen along the pelvis. Critical Value/emergent results  were called by telephone at the time of interpretation on 04/14/2022 at 1:59 pm to provider DAVID TAT , who verbally acknowledged these results. IMPRESSION: 1. Large areas of presumed hematoma involving the rectus abdominis muscles bilaterally, left-greater-than-right. There is also large extraperitoneal hematoma along the margin of the iliopsoas muscle on the left. This extends along the margin of the bladder with mass effect. 2. Small right-sided pleural effusion with adjacent parenchymal opacities. This effusion was seen in 2017 and with slightly larger that time. More thickening of the pleura today. Atelectasis versus infiltrate. 3. Patchy areas of ground-glass in both lungs. This has a differential including atypical or viral process. Recommend follow-up. 4. Cardiomegaly.  Postop chest. 5. Ascending aortic aneurysm measuring 4.3 x 4.0 cm. 6. Nodular contour to the liver. Please correlate for any known history of chronic liver disease. 7. Cholelithiasis. 8. Diffuse vascular calcifications with areas of significant stenosis along branch vessels including SMA, renal arteries. 9. Moderate compression of the superior endplate of L4, not seen in 2017 per the margins are somewhat sclerotic. Favor a subacute to chronic process. Please correlate with history. 10. Aortic atherosclerosis. Aortic Atherosclerosis (ICD10-I70.0). Electronically Signed   By: Jill Side M.D.   On: 04/14/2022 14:04   DG Chest 2 View  Result Date: 04/14/2022 CLINICAL DATA:  Fall EXAM: CHEST - 2 VIEW COMPARISON:  Chest x-ray dated September 23, 2021 FINDINGS: Cardiac and mediastinal contours are unchanged post median sternotomy. Left-greater-than-right pleural effusions. No evidence of pneumothorax. Chronic appearing anterior second right rib fracture. IMPRESSION: Left-greater-than-right pleural effusions bibasilar atelectasis, similar to prior. Electronically Signed   By: Yetta Glassman M.D.   On: 04/14/2022 10:04    Barton Dubois, MD  [facility]  If 7PM-7AM, please contact night-coverage [email or web address] Password [email or web address] 04/23/2022, 12:09 PM   LOS: 9 days

## 2022-04-23 NOTE — Progress Notes (Signed)
ANTICOAGULATION CONSULT NOTE  Pharmacy Consult for Heparin Indication: mechanical aortic valve   Allergies  Allergen Reactions   Tape Itching    Redness, Please use "paper" tape only    Patient Measurements: Height: 5\' 7"  (170.2 cm) Weight: 66.5 kg (146 lb 9.7 oz) IBW/kg (Calculated) : 61.6 HEPARIN DW (KG): 63.7   Vital Signs: Temp: 97.8 F (36.6 C) (02/04 0000) Temp Source: Oral (02/04 0000) BP: 130/53 (02/04 0000) Pulse Rate: 88 (02/04 0000)  Labs: Recent Labs    04/20/22 0421 04/21/22 1858 04/22/22 0509 04/22/22 1430 04/22/22 1546 04/23/22 0144  HGB 9.5*  --  8.7*  --   --   --   HCT 30.2*  --  28.2*  --   --   --   PLT 109*  --  114*  --   --   --   HEPARINUNFRC  --    < > 0.19* >1.10* 0.28* 0.34  CREATININE 1.39*  --  1.16*  --   --   --    < > = values in this interval not displayed.     Estimated Creatinine Clearance: 35.1 mL/min (A) (by C-G formula based on SCr of 1.16 mg/dL (H)).   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   04/11/2022   buPROPion (WELLBUTRIN XL) 150 MG 24 hr tablet Take 150 mg by mouth in the morning.   04/13/2022   busPIRone (BUSPAR) 10 MG tablet Take 10 mg by mouth 2 (two) times daily.   04/13/2022   docusate sodium (COLACE) 100 MG capsule Take 1  capsule (100 mg total) by mouth 2 (two) times daily. 10 capsule 0 04/13/2022   ferrous sulfate 325 (65 FE) MG tablet Take 325 mg by mouth every other day.   04/12/2022   guaifenesin (HUMIBID E) 400 MG TABS tablet Take 400 mg by mouth every 8 (eight) hours as needed (cough/congestion).   04/13/2022   HYDROcodone-acetaminophen (NORCO) 10-325 MG tablet Take 1 tablet by mouth every 6 (six) hours as needed for severe pain. (Patient taking differently: Take 1 tablet by mouth in the morning and at bedtime.) 30 tablet 0 04/13/2022   Multiple Vitamins-Minerals (MULTIVITAMIN WITH MINERALS) tablet Take 1 tablet by mouth in the morning.   04/13/2022   pantoprazole (PROTONIX) 40 MG tablet Take 1 tablet (40 mg total) by mouth daily before breakfast. 30 tablet 5 04/14/2022   polyethylene glycol (MIRALAX / GLYCOLAX) 17 g packet Take 17 g by mouth daily.   04/13/2022   QUEtiapine (SEROQUEL) 25 MG tablet Take 25 mg by mouth at bedtime.   04/13/2022   simvastatin (ZOCOR) 40 MG tablet Take 40 mg by mouth  at bedtime.   04/13/2022   sodium bicarbonate 650 MG tablet Take 650 mg by mouth 3 (three) times daily.   04/13/2022   spironolactone (ALDACTONE) 50 MG tablet Take 50 mg by mouth in the morning.   04/13/2022   torsemide (DEMADEX) 20 MG tablet Take 20 mg by mouth 2 (two) times daily.   04/13/2022   warfarin (COUMADIN) 3 MG tablet Take 1 tablet (3 mg total) by mouth daily.   04/13/2022 at 2000   amoxicillin (AMOXIL) 500 MG capsule Take 2,000 mg by mouth See admin instructions. Take 2000mg  by mouth once daily for 1 day one hour pre-op.      spironolactone (ALDACTONE) 100 MG tablet Take 0.5 tablets (50 mg total) by mouth daily. (Patient not taking: Reported on 04/14/2022)   Not Taking    Assessment: 82 year old female with a history of mechanical aortic valve on warfarin presenting from [facility] with generalized weakness for 3 to 4 days . Patient had Abdominal pan and CT abdomen showed large areas of presumed hematoma. Patient with  extraperitoneal and retroperitoneal bleed. Now stabilized and resumed on anticoagulation conservatively on 2/2 with heparin for mechanical AVR. MD requests heparin level goal 0.3-0.5 with no boluses.   Heparin level therapeutic tonight. Cont current rate.   Goal of Therapy:  Heparin level 0.3-0.5 units/ml Monitor platelets by anticoagulation protocol: Yes   Plan:  No heparin boluses Cont IV heparin 1300 units/hr Check 8hr heparin level Monitor daily CBC, signs/symptoms of bleeding F/U plan for warfarin restart as appropriate   Onnie Boer, PharmD, BCIDP, AAHIVP, CPP Infectious Disease Pharmacist 04/23/2022 2:20 AM

## 2022-04-23 NOTE — Progress Notes (Addendum)
ANTICOAGULATION CONSULT NOTE  Pharmacy Consult for Heparin and Coumadin Indication: mechanical aortic valve   Allergies  Allergen Reactions   Tape Itching    Redness, Please use "paper" tape only    Patient Measurements: Height: 5\' 7"  (170.2 cm) Weight: 66.5 kg (146 lb 9.7 oz) IBW/kg (Calculated) : 61.6 HEPARIN DW (KG): 63.7   Vital Signs: Temp: 97.9 F (36.6 C) (02/04 0800) Temp Source: Oral (02/04 0800) BP: 151/91 (02/04 1000) Pulse Rate: 87 (02/04 1000)  Labs: Recent Labs    04/22/22 0509 04/22/22 1430 04/22/22 1546 04/23/22 0144 04/23/22 0444 04/23/22 0719  HGB 8.7*  --   --   --  8.6*  --   HCT 28.2*  --   --   --  28.2*  --   PLT 114*  --   --   --  114*  --   HEPARINUNFRC 0.19*   < > 0.28* 0.34  --  0.33  CREATININE 1.16*  --   --   --   --   --    < > = values in this interval not displayed.     Estimated Creatinine Clearance: 35.1 mL/min (A) (by C-G formula based on SCr of 1.16 mg/dL (H)).   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   04/11/2022   buPROPion (WELLBUTRIN XL) 150 MG 24 hr tablet Take 150 mg by mouth in the morning.   04/13/2022   busPIRone (BUSPAR) 10 MG tablet Take 10 mg by mouth 2 (two) times daily.   04/13/2022   docusate sodium (COLACE) 100 MG  capsule Take 1 capsule (100 mg total) by mouth 2 (two) times daily. 10 capsule 0 04/13/2022   ferrous sulfate 325 (65 FE) MG tablet Take 325 mg by mouth every other day.   04/12/2022   guaifenesin (HUMIBID E) 400 MG TABS tablet Take 400 mg by mouth every 8 (eight) hours as needed (cough/congestion).   04/13/2022   HYDROcodone-acetaminophen (NORCO) 10-325 MG tablet Take 1 tablet by mouth every 6 (six) hours as needed for severe pain. (Patient taking differently: Take 1 tablet by mouth in the morning and at bedtime.) 30 tablet 0 04/13/2022   Multiple Vitamins-Minerals (MULTIVITAMIN WITH MINERALS) tablet Take 1 tablet by mouth in the morning.   04/13/2022   pantoprazole (PROTONIX) 40 MG tablet Take 1 tablet (40 mg total) by mouth daily before breakfast. 30 tablet 5 04/14/2022   polyethylene glycol (MIRALAX / GLYCOLAX) 17 g packet Take 17 g by mouth daily.   04/13/2022   QUEtiapine (SEROQUEL) 25 MG tablet Take 25 mg by mouth at bedtime.   04/13/2022   simvastatin (ZOCOR) 40 MG tablet Take  40 mg by mouth at bedtime.   04/13/2022   sodium bicarbonate 650 MG tablet Take 650 mg by mouth 3 (three) times daily.   04/13/2022   spironolactone (ALDACTONE) 50 MG tablet Take 50 mg by mouth in the morning.   04/13/2022   torsemide (DEMADEX) 20 MG tablet Take 20 mg by mouth 2 (two) times daily.   04/13/2022   warfarin (COUMADIN) 3 MG tablet Take 1 tablet (3 mg total) by mouth daily.   04/13/2022 at 2000   amoxicillin (AMOXIL) 500 MG capsule Take 2,000 mg by mouth See admin instructions. Take 2000mg  by mouth once daily for 1 day one hour pre-op.      spironolactone (ALDACTONE) 100 MG tablet Take 0.5 tablets (50 mg total) by mouth daily. (Patient not taking: Reported on 04/14/2022)   Not Taking    Assessment: 82 year old female with a history of mechanical aortic valve on warfarin presenting from [facility] with generalized weakness for 3 to 4 days . Patient had Abdominal pan and CT abdomen showed large areas of presumed  hematoma. Patient with extraperitoneal and retroperitoneal bleed. Now stabilized and resumed on anticoagulation conservatively on 2/2 with heparin for mechanical AVR. MD requests heparin level goal 0.3-0.5 with no boluses.   Heparin level 0.33, therapeutic. No bleeding noted Hgb 8.6, stable  MD request restart Coumadin 04/23/22 today. INR 1.6 on 04/19/22, none given since last dose on 04/13/22 PTA. INR on admission 04/14/22 was > 10 => reversed.  MD requested to keep INR around 2.5  Goal of Therapy:  INR 2.5-3.5 Heparin level 0.3-0.5 units/ml Monitor platelets by anticoagulation protocol: Yes   Plan:  Coumadin 3mg  po x 1 today PT-INR daily No heparin boluses Cont IV heparin at 1300 units/hr Check heparin level daily Monitor daily CBC, signs/symptoms of bleeding  Isac Sarna, BS Pharm D, BCPS Clinical Pharmacist 04/23/2022 11:32 AM

## 2022-04-24 DIAGNOSIS — Z7189 Other specified counseling: Secondary | ICD-10-CM | POA: Diagnosis not present

## 2022-04-24 DIAGNOSIS — A419 Sepsis, unspecified organism: Secondary | ICD-10-CM | POA: Diagnosis not present

## 2022-04-24 DIAGNOSIS — D6869 Other thrombophilia: Secondary | ICD-10-CM | POA: Diagnosis not present

## 2022-04-24 DIAGNOSIS — J690 Pneumonitis due to inhalation of food and vomit: Secondary | ICD-10-CM | POA: Diagnosis not present

## 2022-04-24 DIAGNOSIS — Z515 Encounter for palliative care: Secondary | ICD-10-CM | POA: Diagnosis not present

## 2022-04-24 LAB — CBC
HCT: 28.3 % — ABNORMAL LOW (ref 36.0–46.0)
Hemoglobin: 8.6 g/dL — ABNORMAL LOW (ref 12.0–15.0)
MCH: 31.9 pg (ref 26.0–34.0)
MCHC: 30.4 g/dL (ref 30.0–36.0)
MCV: 104.8 fL — ABNORMAL HIGH (ref 80.0–100.0)
Platelets: 124 10*3/uL — ABNORMAL LOW (ref 150–400)
RBC: 2.7 MIL/uL — ABNORMAL LOW (ref 3.87–5.11)
RDW: 19.1 % — ABNORMAL HIGH (ref 11.5–15.5)
WBC: 5.5 10*3/uL (ref 4.0–10.5)
nRBC: 0 % (ref 0.0–0.2)

## 2022-04-24 LAB — PROTIME-INR
INR: 1.7 — ABNORMAL HIGH (ref 0.8–1.2)
Prothrombin Time: 19.4 seconds — ABNORMAL HIGH (ref 11.4–15.2)

## 2022-04-24 LAB — HEPARIN LEVEL (UNFRACTIONATED): Heparin Unfractionated: 0.34 IU/mL (ref 0.30–0.70)

## 2022-04-24 MED ORDER — WARFARIN SODIUM 2 MG PO TABS
3.0000 mg | ORAL_TABLET | Freq: Once | ORAL | Status: AC
  Administered 2022-04-24: 3 mg via ORAL
  Filled 2022-04-24: qty 1

## 2022-04-24 MED ORDER — FUROSEMIDE 10 MG/ML IJ SOLN
20.0000 mg | Freq: Two times a day (BID) | INTRAMUSCULAR | Status: AC
  Administered 2022-04-24 – 2022-04-25 (×2): 20 mg via INTRAVENOUS
  Filled 2022-04-24 (×2): qty 2

## 2022-04-24 NOTE — TOC Progression Note (Signed)
Transition of Care [facility]) - Progression Note    Patient Details  Name: RESA RINKS MRN: [id number] Date of Birth: 12-14-1940  [facility] CM/SW Contact  Salome Arnt, [place] Phone Number: 04/24/2022, 12:00 PM  Clinical Narrative:  LCSW reviewed bed offers with pt's husband who chooses [place] updated. Per MD, d/c 2-3 days. Will start auth day before d/c.      Expected Discharge Plan: [place] Barriers to Discharge: Continued Medical Work up  Expected Discharge Plan and Services In-house Referral: Clinical Social Work Discharge Planning Services: CM Consult Post Acute Care Choice: [place] Living arrangements for the past 2 months: [place]                                       Social Determinants of Health (SDOH) Interventions SDOH Screenings   Food Insecurity: No Food Insecurity (04/15/2022)  Housing: Low Risk  (04/15/2022)  Transportation Needs: No Transportation Needs (04/15/2022)  Utilities: Not At Risk (04/15/2022)  Tobacco Use: Low Risk  (04/14/2022)    Readmission Risk Interventions    04/16/2022   12:03 PM 12/19/2020    2:33 PM  Readmission Risk Prevention Plan  Transportation Screening Complete Complete  Medication Review [employer]) Complete Complete  HRI or Home Care Consult Complete Complete  SW Recovery Care/Counseling Consult Complete Complete  Palliative Care Screening Not Applicable Not Applicable  Skilled Nursing Facility Complete Complete

## 2022-04-24 NOTE — Progress Notes (Addendum)
PROGRESS NOTE  Mary [name] VPX:[id number] DOB: 09-14-1940 DOA: 04/14/2022 PCP: Pcp, No  Brief History:  82 year old female with a history of dementia, hypertension, hyperlipidemia, CKD stage III, anxiety, mechanical aortic valve, stroke, diastolic CHF, and anxiety presenting from [facility] with generalized weakness for 3 to 4 days.  She has had associated decreased oral intake and nausea.  The patient is a difficult historian.  The patient's spouse is at the bedside.  He is also a difficult historian.  Nevertheless, it appears that the patient was treated for pneumonia.  Review of the [facility] from [place] shows the patient was started on levofloxacin on 04/08/2022 for 5-day course.  Even with the antibiotics, the patient continued to have progressive generalized weakness.  She denies any headache, neck pain, chest pain, hemoptysis, vomiting, diarrhea.  She does complain of abdominal pain.  She states that the abdominal pain has been present for 3 to 4 days.  There is no hematochezia or melena.  She has some dyspnea with exertion. In the ED, the patient was afebrile hemodynamically stable with oxygen saturation 99% on room air.  WBC 10.8, hemoglobin 9.3, platelets 302,000.  Sodium 135, potassium 4.4, bicarbonate 24, serum creatinine 1.77.  The patient was started on IV ceftriaxone and azithromycin and IV fluids.  She was admitted for further evaluation and treatment.   Assessment/Plan: SIRS -concerned about intra-abdominal source of infection initially -CT abd--- Large areas of presumed hematoma involving the rectus abdominis muscles bilaterally, left-greater-than-right. There is also large extraperitoneal hematoma along the margin of the iliopsoas muscle on the left. -presented with leukocytosis, tachycardia, elevated lactate -follow blood culture--neg to date -UA neg for pyuria -Lactic acid peaked 2.2 -Continue IV fluids>>saline locked -Treated with IV Zosyn and subsequently transition  to Augmentin on 04/18/2022; planning for 2 more days of antibiotics; given second acute choking event with concern for reaspiration process. -Chest x-ray on 04/20/2022 demonstrating patchy infiltrates suggestive of aspiration. -sepsis ruled out.   Abdominal pain/extraperitoneal/retroperitoneal bleed/ Acute Blood Loss Anemia -CT abdomen and pelvis--- Large areas of presumed hematoma involving the rectus abdominismuscles bilaterally, left-greater-than-right. There is also largeextraperitoneal hematoma along the margin of the iliopsoas muscle on the left -Status post 8 units PRBC transfused throughout hospitalization. -Patient has also received fresh frozen plasma transfusion. -Continue fentanyl prn pain -Continue to follow hemoglobin trend. -Latest hemoglobin has remained stable at 8.6 -Continue to follow trend. -Continue to follow hemoglobin trend and Coumadin level.   Supratherapeutic INR -Likely secondary to the patient's recent use of levofloxacin and decreased oral intake in the setting of warfarin use -Vitamin K 20 mg total given for the admission -9 units FFP given  total (last one ordered 1/30) -currently subtherapeutic and stable. -Heparin drip started. -Hemoglobin stable; no signs of overt bleeding. -Will restart Coumadin.  Aiming for INR of 2.5. -INR currently 1.7   Status post mechanical aortic valve replacement -Continue anticoagulation with INR goal of 2.5-3.5 -Continue holding warfarin temporarily secondary to supratherapeutic INR -Now that patient's hemoglobin has remained stable; will continue the use of heparin drip and restart Coumadin for INR goal of 2.5.   Aspiration pneumonitis -initially started on zosyn -Successfully transitioned to amox/clav>> planning for 1 more days to finish abx therapy. -Continue the use of Mucinex, flutter valve and as needed bronchodilator.   Essential hypertension -Vital signs stable -Continue to follow blood pressure trend and resume  antihypertensive agents as required.   Dementia without behavioral disturbance -At risk for  hospital delirium -Continue constant reorientation.   Mixed hyperlipidemia -Resume the use of simvastatin at time of discharge. -Continue heart healthy diet.   Anxiety/depression -Continue BuSpar   Acute on chronic renal failure--CKD stage IV -Renal function has improved and back to baseline at this moment (creatinine 1.39) -due to hemodynamic changes, IV contrast -Good response to Lasix given; no crackles appreciated on examination.  Very still having signs of diffuse edema. -Continue to monitor daily basic metabolic panel. -Renal function has remained stable.   Thrombocytopenia -due to acute medical illness -Continue to follow platelet counts trend/stability.   Hypokalemia -In the setting of GI losses and decreased oral intake -Magnesium within normal limit -Continue to follow electrolytes trend and further replete as needed. -Stable overall.  Diarrhea -C. difficile PCR negative -Most likely triggered by the use of antibiotics -Continue supportive care. -Continue treatment with Florastor and as needed Imodium.  Dysphagia/weakness and deconditioning -CT scan not demonstrating not acute ischemic process -Most likely secondary to aspiration and vocal cord dysfunction -Continue supportive care -Continue to follow recommendations by speech therapy. -Physical therapy recommending skilled nursing facility for rehabilitation at discharge. -Planning for discharge once INR therapeutic.   Goals of Care -confirmed with patient, daughter, spouse>>DNR -Continue current goals of care. -Physical therapy evaluation will be requested -Overall prognosis and future recovery guarded.      Family Communication:   Patient's husband updated at bedside 04/20/2022; patient's daughter updated over the phone 04/21/2022.   Consultants:  none   Code Status:  DNR   DVT Prophylaxis:  SCDs      Procedures: As Listed in Progress Note Above   Antibiotics: Zosyn 1/26>>1/29 Amox/clav 1/29>> Azithro 1/26>>1/28   Subjective: Stable oxygen supplementation at 1.5 L nasal cannula; more alert on today's evaluation.  Still demonstrating difficulty with articulation and tolerating dysphagia 1 diet with nectar thick.  Patient significantly frail, deconditioned and expressing decreased appetite.  Objective: Vitals:   04/24/22 0200 04/24/22 0400 04/24/22 0600 04/24/22 0700  BP: 137/63 (!) 113/38 (!) 119/34   Pulse: 84 78 78   Resp: (!) 22 16 17    Temp:    98.2 F (36.8 C)  TempSrc:    Oral  SpO2: 99% 100% 100%   Weight:      Height:        Intake/Output Summary (Last 24 hours) at 04/24/2022 0916 Last data filed at 04/23/2022 2000 Gross per 24 hour  Intake 1069.29 ml  Output 250 ml  Net 819.29 ml   Weight change:   Exam: General exam: Alert, awake, oriented x 3; in no acute distress.  Denies chest pain, no nausea, no vomiting, no fever.  Diffuse muscular tenderness especially in her torso. Respiratory system: Positive scattered rhonchi; no wheezing.  No using accessory muscle.  1.5 nasal cannula supplementation in place. Cardiovascular system:RRR. No rubs or gallops; no JVD. Gastrointestinal system: Abdomen is nondistended, soft and tender to palpation in her flanks area; hematoma/bruising continue reabsorbing/healing. Central nervous system: Alert and oriented. No focal neurological deficits. Extremities: No cyanosis or clubbing; 1+ edema appreciated bilaterally in 4 limbs. Skin: No petechiae. Psychiatry: Judgement and insight appear normal. Mood & affect appropriate.   Data Reviewed: I have personally reviewed following labs and imaging studies  Basic Metabolic Panel: Recent Labs  Lab 04/18/22 0433 04/19/22 0421 04/20/22 0421 04/22/22 0509  NA 138 137 136 138  K 3.5 3.1* 3.6 3.8  CL 107 106 102 106  CO2 21* 26 25 26   GLUCOSE 144* 92 116* 103*  BUN 46* 40* 36* 33*   CREATININE 1.80* 1.47* 1.39* 1.16*  CALCIUM 8.2* 8.3* 8.4* 8.4*  MG  --  2.2  --   --    Liver Function Tests: No results for input(s): "AST", "ALT", "ALKPHOS", "BILITOT", "PROT", "ALBUMIN" in the last 168 hours.  Coagulation Profile: Recent Labs  Lab 04/18/22 0748 04/19/22 0421 04/24/22 0442  INR 1.7* 1.6* 1.7*   CBC: Recent Labs  Lab 04/19/22 0421 04/20/22 0421 04/22/22 0509 04/23/22 0444 04/24/22 0442  WBC 8.4 8.2 8.5 6.8 5.5  HGB 9.4* 9.5* 8.7* 8.6* 8.6*  HCT 29.0* 30.2* 28.2* 28.2* 28.3*  MCV 98.6 101.3* 103.7* 104.8* 104.8*  PLT 110* 109* 114* 114* 124*   Urine analysis:    Component Value Date/Time   COLORURINE YELLOW 04/14/2022 2200   APPEARANCEUR CLEAR 04/14/2022 2200   LABSPEC >1.046 (H) 04/14/2022 2200   PHURINE 5.0 04/14/2022 2200   GLUCOSEU NEGATIVE 04/14/2022 2200   HGBUR NEGATIVE 04/14/2022 2200   BILIRUBINUR NEGATIVE 04/14/2022 2200   KETONESUR NEGATIVE 04/14/2022 2200   PROTEINUR NEGATIVE 04/14/2022 2200   UROBILINOGEN 2.0 (H) 11/01/2007 1518   NITRITE NEGATIVE 04/14/2022 2200   LEUKOCYTESUR NEGATIVE 04/14/2022 2200   Sepsis Labs:  Recent Results (from the past 240 hour(s))  Blood culture (routine x 2)     Status: None   Collection Time: 04/14/22 10:30 AM   Specimen: BLOOD  Result Value Ref Range Status   Specimen Description   Final    BLOOD LEFT ASSIST CONTROL Performed at [facility], [address], [place], [place] [address]    Special Requests   Final    BOTTLES DRAWN AEROBIC AND ANAEROBIC Blood Culture adequate volume Performed at [facility], [address], [place], [place] [address]    Culture   Final    NO GROWTH 5 DAYS Performed at [facility], [place] [address], [place], [place] [address]    Report Status 04/19/2022 FINAL  Final  Blood culture (routine x 2)     Status: None   Collection Time: 04/14/22 10:42 AM   Specimen: BLOOD  Result Value Ref Range Status   Specimen Description   Final    BLOOD RIGHT ASSIST  CONTROL Performed at [facility], [address], [place], [place] [address]    Special Requests   Final    BOTTLES DRAWN AEROBIC AND ANAEROBIC Blood Culture adequate volume Performed at [facility], [address], [place], [place] [address]    Culture   Final    NO GROWTH 5 DAYS Performed at [facility], [place] [address] [place], [place] [address]    Report Status 04/19/2022 FINAL  Final  Resp panel by RT-PCR (RSV, Flu A&B, Covid) Anterior Nasal Swab     Status: None   Collection Time: 04/14/22  6:00 PM   Specimen: Anterior Nasal Swab  Result Value Ref Range Status   SARS Coronavirus 2 by RT PCR NEGATIVE NEGATIVE Final    Comment: (NOTE) SARS-CoV-2 target nucleic acids are NOT DETECTED.  The SARS-CoV-2 RNA is generally detectable in upper respiratory specimens during the acute phase of infection. The lowest concentration of SARS-CoV-2 viral copies this assay can detect is 138 copies/mL. A negative result does not preclude SARS-Cov-2 infection and should not be used as the sole basis for treatment or other patient management decisions. A negative result may occur with  improper specimen collection/handling, submission of specimen other than nasopharyngeal swab, presence of viral mutation(s) within the areas targeted  by this assay, and inadequate number of viral copies(<138 copies/mL). A negative result must be combined with clinical observations, patient history, and epidemiological information. The expected result is Negative.  Fact Sheet for Patients:  EntrepreneurPulse.com.au  Fact Sheet for Healthcare Providers:  IncredibleEmployment.be  This test is no t yet approved or cleared by the [place] FDA and  has been authorized for detection and/or diagnosis of SARS-CoV-2 by FDA under an Emergency Use Authorization (EUA). This EUA will remain  in effect (meaning this test can be used) for the duration of the COVID-19 declaration  under Section 564(b)(1) of the Act, 21 U.S.C.section 360bbb-3(b)(1), unless the authorization is terminated  or revoked sooner.       Influenza A by PCR NEGATIVE NEGATIVE Final   Influenza B by PCR NEGATIVE NEGATIVE Final    Comment: (NOTE) The Xpert Xpress SARS-CoV-2/FLU/RSV plus assay is intended as an aid in the diagnosis of influenza from Nasopharyngeal swab specimens and should not be used as a sole basis for treatment. Nasal washings and aspirates are unacceptable for Xpert Xpress SARS-CoV-2/FLU/RSV testing.  Fact Sheet for Patients: EntrepreneurPulse.com.au  Fact Sheet for Healthcare Providers: IncredibleEmployment.be  This test is not yet approved or cleared by the [place] FDA and has been authorized for detection and/or diagnosis of SARS-CoV-2 by FDA under an Emergency Use Authorization (EUA). This EUA will remain in effect (meaning this test can be used) for the duration of the COVID-19 declaration under Section 564(b)(1) of the Act, 21 U.S.C. section 360bbb-3(b)(1), unless the authorization is terminated or revoked.     Resp Syncytial Virus by PCR NEGATIVE NEGATIVE Final    Comment: (NOTE) Fact Sheet for Patients: EntrepreneurPulse.com.au  Fact Sheet for Healthcare Providers: IncredibleEmployment.be  This test is not yet approved or cleared by the [place] FDA and has been authorized for detection and/or diagnosis of SARS-CoV-2 by FDA under an Emergency Use Authorization (EUA). This EUA will remain in effect (meaning this test can be used) for the duration of the COVID-19 declaration under Section 564(b)(1) of the Act, 21 U.S.C. section 360bbb-3(b)(1), unless the authorization is terminated or revoked.  Performed at [facility], [address] [place], [place] [address]   MRSA Next Gen by PCR, Nasal     Status: None   Collection Time: 04/14/22  6:53 PM   Specimen: Nasal  Mucosa; Nasal Swab  Result Value Ref Range Status   MRSA by PCR Next Gen NOT DETECTED NOT DETECTED Final    Comment: (NOTE) The GeneXpert MRSA Assay (FDA approved for NASAL specimens only), is one component of a comprehensive MRSA colonization surveillance program. It is not intended to diagnose MRSA infection nor to guide or monitor treatment for MRSA infections. Test performance is not FDA approved in patients less than 22 years old. Performed at [facility], [address], [place], [place] [address]   C Difficile Quick Screen (NO PCR Reflex)     Status: None   Collection Time: 04/18/22  4:52 AM   Specimen: STOOL  Result Value Ref Range Status   C Diff antigen NEGATIVE NEGATIVE Final   C Diff toxin NEGATIVE NEGATIVE Final   C Diff interpretation No C. difficile detected.  Final    Comment: Performed at [facility], [address] [place], [place] [address]    Scheduled Meds:  buPROPion  150 mg Oral q AM   busPIRone  10 mg Oral BID   Chlorhexidine Gluconate Cloth  6 each Topical Daily   dextromethorphan-guaiFENesin  1 tablet Oral BID   mouth rinse  15 mL Mouth Rinse 4 times per day   pantoprazole (PROTONIX) IV  40 mg Intravenous Q12H   saccharomyces boulardii  250 mg Oral BID   simvastatin  40 mg Oral q1800   sodium bicarbonate  650 mg Oral TID   sodium chloride flush  10-40 mL Intracatheter Q12H   warfarin  3 mg Oral ONCE-1600   Warfarin - Pharmacist Dosing Inpatient   Does not apply q1600   Continuous Infusions:  heparin 1,300 Units/hr (04/24/22 0319)   Procedures/Studies: CT HEAD WO CONTRAST (5MM)  Result Date: 04/21/2022 CLINICAL DATA:  Neuro deficit.  Stroke suspected. EXAM: CT HEAD WITHOUT CONTRAST TECHNIQUE: Contiguous axial images were obtained from the base of the skull through the vertex without intravenous contrast. RADIATION DOSE REDUCTION: This exam was performed according to the departmental dose-optimization program which includes automated exposure control,  adjustment of the mA and/or kV according to patient size and/or use of iterative reconstruction technique. COMPARISON:  CT Brain 09/23/21 FINDINGS: Brain: No evidence of acute infarction, hemorrhage, hydrocephalus, extra-axial collection or mass lesion/mass effect. Sequela of moderate to severe chronic microvascular ischemic change. Chronic small right cerebellar infarct. Vascular: No hyperdense vessel or unexpected calcification. Skull: Normal. Negative for fracture or focal lesion. Sinuses/Orbits: Trace right mastoid effusion. No middle ear effusion. There is impacted cerumen in the left EAC. Right lens replacement. Redemonstrated mucosal thickening in the right maxillary sinus and dense inspissated secretions or chronic fungal colonization. Other: None IMPRESSION: 1. No hemorrhage or CT evidence of an acute cortical infarct. 2. Sequela of moderate to severe chronic microvascular ischemic change. Chronic small right cerebellar infarct. Electronically Signed   By: Marin Roberts M.D.   On: 04/21/2022 15:27   DG CHEST PORT 1 VIEW  Result Date: 04/20/2022 CLINICAL DATA:  Aspiration. EXAM: PORTABLE CHEST 1 VIEW COMPARISON:  04/14/2022 FINDINGS: Right arm PICC line tip terminates in the distal SVC. Previous median sternotomy. Stable mild cardiac enlargement. Persistent small bilateral pleural effusions, right greater than left. New bilateral lower lobe zone peribronchovascular opacities are identified along with scattered areas of subsegmental atelectasis. IMPRESSION: 1. New bilateral lower lobe peribronchovascular opacities compatible with aspiration. 2. Persistent small bilateral pleural effusions, right greater than left. Electronically Signed   By: Kerby Moors M.D.   On: 04/20/2022 09:44   [place] EKG SITE RITE  Result Date: 04/15/2022 If Site Rite image not attached, placement could not be confirmed due to current cardiac rhythm.  CT Angio Abd/Pel w/ and/or w/o  Result Date: 04/14/2022 CLINICAL DATA:   Retroperitoneal bleed suspected, extraperitoneal bleed. EXAM: CTA ABDOMEN AND PELVIS WITHOUT AND WITH CONTRAST TECHNIQUE: Multidetector CT imaging of the abdomen and pelvis was performed using the standard protocol during bolus administration of intravenous contrast. Multiplanar reconstructed images and MIPs were obtained and reviewed to evaluate the vascular anatomy. RADIATION DOSE REDUCTION: This exam was performed according to the departmental dose-optimization program which includes automated exposure control, adjustment of the mA and/or kV according to patient size and/or use of iterative reconstruction technique. CONTRAST:  40mL OMNIPAQUE IOHEXOL 350 MG/ML SOLN COMPARISON:  Noncontrast CT earlier today. FINDINGS: VASCULAR Aorta: Moderate atherosclerosis. No aneurysm, dissection or acute findings. Celiac: Calcified plaque at the origin causes mild stenosis. Distal branch vessels are patent. No aneurysm, dissection or acute findings. SMA: Mild diffuse atheromatous plaque with mild areas of stenosis. No aneurysm or acute findings. Renals: Mild plaque in the proximal renal arteries causing varying degrees of stenosis up to  moderate. No aneurysm or acute findings. IMA: Patent. Inflow: Mild atherosclerosis without acute findings. Proximal Outflow: Bilateral common femoral and visualized portions of the superficial and profunda femoral arteries are patent without evidence of aneurysm, dissection, vasculitis or significant stenosis. Retroperitoneal and extraperitoneal hematomas. There is no active arterial extravasation within any of the retroperitoneal or extraperitoneal bleed. Veins: Venous phase imaging demonstrates patency of the iliac veins and IVC. Portal, splenic and mesenteric veins are patent. No areas of active extravasation or seen on venous phase. Review of the MIP images confirms the above findings. NON-VASCULAR Lower chest: Right pleural effusion with pleural enhancement, unchanged from earlier today.  Hepatobiliary: Nodular hepatic contours typical of cirrhosis. Water density there is a 13 mm low-density lesion in the left lobe of the liver series 12, image 21, measuring slightly higher than simple fluid density, nonspecific. Multiple gallstones without evidence of gallbladder inflammation. Pancreas: Parenchymal atrophy. No ductal dilatation or inflammation. Spleen: No splenomegaly. Adrenals/Urinary Tract: No adrenal nodule or hemorrhage. Bilateral renal parenchymal atrophy with multifocal areas of parenchymal scarring. Parenchymal calcifications in the upper pole the right kidney. No hydronephrosis. Displaced to the right due to left pelvic fluid collections. The bladder appears thick walled which is nonspecific. Stomach/Bowel: No bowel obstruction or inflammation. There is mild colonic distension with stool proximally and air distally. The appendix is not seen. Lymphatic: No gross adenopathy. Reproductive: Uterine vascular calcifications again seen. Other: Again seen multiple areas of presume extraperitoneal and retroperitoneal hematoma/hemorrhage. Largest area is in the left pelvis measuring approximately 12.9 x 7 cm with hematocrit level, measured on series 12, image 78. This may be contiguous with the left rectus abdominus musculature. More superiorly is an area of presumed rectus hematoma measuring 6.1 x 5.1 cm, measured on series 12, image 67. There is stranding in the left retroperitoneum tracking anterior to the iliopsoas muscle that is primarily ill-defined. Stranding within the right extraperitoneal M with ill-defined hemorrhage. Fluid collections within the upper abdominal wall musculature just underneath the anterior ribs, 8.7 x 3.3 cm on the left series 12, image 30, 4.0 x 2.7 cm on the right series 12, image 31. There is no evidence of active extravasation within any of these hematomas. There is edema within the subcutaneous tissues of the left flank/abdominal wall as well as anterior to the lower  abdominal wall musculature. Musculoskeletal: Unchanged from earlier today with L4 compression deformity. Left hip arthroplasty. IMPRESSION: 1. No evidence of active extravasation within any of the retroperitoneal or extraperitoneal fluid collections/presumed hematoma. 2. Multiple areas of presume extraperitoneal and retroperitoneal hematoma/hemorrhage, largest area in the left pelvis measuring 12.9 x 7 cm, likely contiguous with the left rectus abdominus musculature. Allowing for differences in caliper placement in technique, no significant change in size of these collections from earlier today. Possibility of infection is considered but felt less likely. 3. Cirrhosis. Indeterminate 13 mm low-density lesion in the left lobe of the liver measuring slightly higher than simple fluid density. Recommend further evaluation with MRI on a nonemergent basis after resolution of acute event. 4. Cholelithiasis without acute cholecystitis. 5. Aortic and branch atherosclerosis with areas of arterial stenosis up to moderate in degree, as described. 6. Additional chronic changes are stable from earlier today. Aortic Atherosclerosis (ICD10-I70.0). Electronically Signed   By: Keith Rake M.D.   On: 04/14/2022 16:41   CT CHEST ABDOMEN PELVIS WO CONTRAST  Result Date: 04/14/2022 CLINICAL DATA:  Generalized pain.  Sepsis.  Nausea. EXAM: CT CHEST, ABDOMEN AND PELVIS WITHOUT CONTRAST TECHNIQUE: Multidetector CT  imaging of the chest, abdomen and pelvis was performed following the standard protocol without IV contrast. RADIATION DOSE REDUCTION: This exam was performed according to the departmental dose-optimization program which includes automated exposure control, adjustment of the mA and/or kV according to patient size and/or use of iterative reconstruction technique. COMPARISON:  Chest x-ray earlier 04/14/2022. Older x-rays. CT scan abdomen pelvis 2017 July FINDINGS: CT CHEST FINDINGS Cardiovascular: Heart is enlarged. Trace  pericardial fluid. Prominent calcifications along the mitral valve. Postop chest with prosthetic aortic valve. The thoracic aorta has scattered calcified atherosclerotic plaque. Diameter of the ascending aorta at the level of the right pulmonary artery measures 4.3 by 4.0 cm. Mediastinum/Nodes: Small thyroid gland. No specific abnormal lymph node enlargement present in the axillary region, hila or mediastinum on this noncontrast exam. Prominent subcarinal node is noted measuring 17 by 10 mm on series 2, image 30. Normal caliber thoracic esophagus. Lungs/Pleura: Diffuse breathing motion. Small right-sided pleural effusion identified with some adjacent parenchymal opacities. Associated pleural thickening. This effusion was seen in 2017 and with slightly larger that time. More thickening today of the pleura. Atelectasis versus infiltrate. There is some bandlike areas of opacity as well in the middle lobe. Scattered areas of interstitial septal thickening are identified. There are few patchy ground-glass areas as well in the right upper lobe towards the apex such as series 3, image 42, series 3, image 53. Additional areas scattered in the left upper lobe, middle lobe and superior segment of left lower lobe. This has a differential including atypical or viral process. Recommend follow-up. Musculoskeletal: Osteopenia. Scattered degenerative changes noted along the spine. Of note the patient's arms were scanned at the patient's side. CT ABDOMEN PELVIS FINDINGS Hepatobiliary: Nodular contour to the liver. Please correlate for any known history of chronic liver disease. There are multiple stones in the nondilated gallbladder. Pancreas: Moderate pancreatic atrophy. No obvious mass. Appearance is similar to prior. Spleen: Spleen is grossly nonenlarged. Adrenals/Urinary Tract: Adrenal glands are grossly preserved. There is mild bilateral renal atrophy with some focal areas along the midportion left kidney laterally. Some  calcifications noted along the hilum of the right kidney which very well could be vascular. No definite ureteral or renal stones otherwise. The bladder is somewhat decompressed with wall thickening but there is mass effect along the bladder from the adjacent fluid collections. Stomach/Bowel: Stomach is mildly distended with fluid. The small bowel is nondilated. Large bowel is nondilated prominent right-sided colonic stool. Again evaluation is limited by significant motion. Vascular/Lymphatic: Diffuse vascular calcifications are identified with areas of significant stenosis suggested along branch vessels including SMA, renal arteries. Normal caliber IVC. Reproductive: Scattered calcifications along the uterus. These could be vascular. No separate adnexal mass. Other: Significant motion noted limited evaluation. Extensive areas of presumed hematoma identified. These include the rectus abdominis muscles bilaterally, left-greater-than-right. The left hematoma extends along the course of the entirety of the rectus abdominis muscle, cephalocaudal length approaching 29 cm. Transverse dimension at the level of the pelvis approximate 7.4 by 4.8 cm. Again the right-sided areas are smaller and are along the lower chest and upper abdomen regions. There is also a large extraperitoneal hematoma more on the left than on the right with mass effect along surrounding structures including the bladder. This extends along the margin of the iliacus muscle. Longitudinal length on coronal image 89 of series 5 measures a proximally 18.2 cm. Axial dimension maximally approaches 10.5 by 5.5 cm. Anasarca. Musculoskeletal: Streak artifact related to patient's left hip arthroplasty obscuring the  surrounding tissues. Curvature and degenerative changes seen along the spine. Osteopenia. Multilevel stenosis identified along the lumbar spine. There is moderate compression of the superior endplate of L4 which was not seen in 2017 per the margins are  somewhat sclerotic. Favor a subacute to chronic process. Please correlate with history. There is multilevel disc bulging. Degenerative changes seen along the pelvis. Critical Value/emergent results were called by telephone at the time of interpretation on 04/14/2022 at 1:59 pm to provider DAVID TAT , who verbally acknowledged these results. IMPRESSION: 1. Large areas of presumed hematoma involving the rectus abdominis muscles bilaterally, left-greater-than-right. There is also large extraperitoneal hematoma along the margin of the iliopsoas muscle on the left. This extends along the margin of the bladder with mass effect. 2. Small right-sided pleural effusion with adjacent parenchymal opacities. This effusion was seen in 2017 and with slightly larger that time. More thickening of the pleura today. Atelectasis versus infiltrate. 3. Patchy areas of ground-glass in both lungs. This has a differential including atypical or viral process. Recommend follow-up. 4. Cardiomegaly.  Postop chest. 5. Ascending aortic aneurysm measuring 4.3 x 4.0 cm. 6. Nodular contour to the liver. Please correlate for any known history of chronic liver disease. 7. Cholelithiasis. 8. Diffuse vascular calcifications with areas of significant stenosis along branch vessels including SMA, renal arteries. 9. Moderate compression of the superior endplate of L4, not seen in 2017 per the margins are somewhat sclerotic. Favor a subacute to chronic process. Please correlate with history. 10. Aortic atherosclerosis. Aortic Atherosclerosis (ICD10-I70.0). Electronically Signed   By: Jill Side M.D.   On: 04/14/2022 14:04   DG Chest 2 View  Result Date: 04/14/2022 CLINICAL DATA:  Fall EXAM: CHEST - 2 VIEW COMPARISON:  Chest x-ray dated September 23, 2021 FINDINGS: Cardiac and mediastinal contours are unchanged post median sternotomy. Left-greater-than-right pleural effusions. No evidence of pneumothorax. Chronic appearing anterior second right rib fracture.  IMPRESSION: Left-greater-than-right pleural effusions bibasilar atelectasis, similar to prior. Electronically Signed   By: Yetta Glassman M.D.   On: 04/14/2022 10:04    Barton Dubois, MD  [facility]  If 7PM-7AM, please contact night-coverage [email or web address] Password [email or web address] 04/24/2022, 9:16 AM   LOS: 10 days

## 2022-04-24 NOTE — Progress Notes (Signed)
Palliative: Mrs. Angelo is lying quietly in bed.  She appears acutely/chronically ill and frail.  She will make and somewhat keep eye contact.  She has known dementia, but is oriented to self only.  She is able to tell me her husband is present and tell me his name.  Her husband of 11 years, Jermani Pund, is present at bedside.  We talk about Mrs. Diprima acute and chronic health concerns.  We talked about the treatment plan.  As we are talking, aide visits and offers a drink.  Mrs. Yassin coughs after drinking thin liquids, but has been evaluated by speech therapy.  We talk about short-term rehab.  At this point Mr. Nolting states that his preference would be [facility], but they have n transition of care team for placement at [facility] for rehab.  I encouraged him to work closely with transition of care team.  No further questions at this time.  Conference with attending, bedside nursing staff, transition of care team related to patient condition, needs, goals of care, disposition.  Plan: At this point continue to treat the treatable but no CPR or intubation.  Short-term rehab with ultimate goal of returning home.  28 minutes Quinn Axe, NP Palliative medicine team Team phone [phone] Greater than 50% of this time was spent counseling and coordinating care related to the above assessment and plan.

## 2022-04-24 NOTE — Progress Notes (Signed)
Speech Language Pathology Treatment: Dysphagia  Patient Details Name: Mary [name] MRN: [id number] DOB: November 29, 1940 Today's Date: 04/24/2022 Time: 2505-3976 SLP Time Calculation (min) (ACUTE ONLY): 25 min  Assessment / Plan / Recommendation Clinical Impression  Pt seen at bedside for ongoing dysphagia intervention. She continues to present with lethargy and limited intake per staff. Her vocal quality is mildly hoarse. She was noted to have thin and NTL water at bedside. Pt assessed with ice chips, thin water, NTL, and puree. Pt with improved tolerance of ice chips this date. She presented with immediate cough after sips of thin liquids, suspect poor oral control/premature spillage over the base of the tongue with airway invasion. Pt cued to take a small sip and briefly hold the water in her mouth before swallowing fast and hard, which eliminated coughing. She had one strong coughing episode with drinking straw sips of NTL. Pt needed encouragement for puree intake and preferred the sweeter options. Continue D1/puree and NTL and will try to complete MBSS tomorrow.     HPI HPI: 82 year old female with a history of dementia, hypertension, hyperlipidemia, CKD stage III, anxiety, mechanical aortic valve, stroke, diastolic CHF, and anxiety presenting from [facility] with generalized weakness for 3 to 4 days.  She has had associated decreased oral intake and nausea.  The patient is a difficult historian.  The patient's spouse is at the bedside.  He is also a difficult historian.  Nevertheless, it appears that the patient was treated for pneumonia.  Review of the [facility] from [place] shows the patient was started on levofloxacin on 04/08/2022 for 5-day course.  Even with the antibiotics, the patient continued to have progressive generalized weakness.  She denies any headache, neck pain, chest pain, hemoptysis, vomiting, diarrhea.  She does complain of abdominal pain.  She states that the abdominal pain has been  present for 3 to 4 days.  There is no hematochezia or melena.  She has some dyspnea with exertion.  In the ED, the patient was afebrile hemodynamically stable with oxygen saturation 99% on room air.  WBC 10.8, hemoglobin 9.3, platelets 302,000.  Sodium 135, potassium 4.4, bicarbonate 24, serum creatinine 1.77.  The patient was started on IV ceftriaxone and azithromycin and IV fluids.  She was admitted for further evaluation and treatment. On 04/20/22 Acute respiratory distress while eating breakfast experiencing choking event.  Transient use of nonrebreather and subsequently 5 L nasal cannula supplementation. BSE requested.      SLP Plan  Continue with current plan of care      Recommendations for follow up therapy are one component of a multi-disciplinary discharge planning process, led by the attending physician.  Recommendations may be updated based on patient status, additional functional criteria and insurance authorization.    Recommendations  Diet recommendations: Dysphagia 1 (puree);Nectar-thick liquid Liquids provided via: Cup;Teaspoon;No straw Medication Administration: Whole meds with puree (or whole with thins) Supervision: Full supervision/cueing for compensatory strategies Compensations: Slow rate;Small sips/bites Postural Changes and/or Swallow Maneuvers: Seated upright 90 degrees                Oral Care Recommendations: Oral care BID;Staff/trained caregiver to provide oral care Follow Up Recommendations: Follow physician's recommendations for discharge plan and follow up therapies Assistance recommended at discharge: Frequent or constant Supervision/Assistance SLP Visit Diagnosis: Dysphagia, unspecified (R13.10) Plan: Continue with current plan of care         Thank you,  Genene Churn, [place]   [place]  04/24/2022, 6:29 PM

## 2022-04-24 NOTE — Progress Notes (Signed)
ANTICOAGULATION CONSULT NOTE  Pharmacy Consult for Heparin and Coumadin Indication: mechanical aortic valve   Allergies  Allergen Reactions   Tape Itching    Redness, Please use "paper" tape only    Patient Measurements: Height: 5\' 7"  (170.2 cm) Weight: 66.5 kg (146 lb 9.7 oz) IBW/kg (Calculated) : 61.6 HEPARIN DW (KG): 63.7   Vital Signs: Temp: 98.2 F (36.8 C) (02/05 0700) Temp Source: Oral (02/05 0700) BP: 119/34 (02/05 0600) Pulse Rate: 78 (02/05 0600)  Labs: Recent Labs    04/22/22 0509 04/22/22 1430 04/23/22 0144 04/23/22 0444 04/23/22 0719 04/24/22 0442  HGB 8.7*  --   --  8.6*  --  8.6*  HCT 28.2*  --   --  28.2*  --  28.3*  PLT 114*  --   --  114*  --  124*  LABPROT  --   --   --   --   --  19.4*  INR  --   --   --   --   --  1.7*  HEPARINUNFRC 0.19*   < > 0.34  --  0.33 0.34  CREATININE 1.16*  --   --   --   --   --    < > = values in this interval not displayed.     Estimated Creatinine Clearance: 35.1 mL/min (A) (by C-G formula based on SCr of 1.16 mg/dL (H)).   Medical History: Past Medical History:  Diagnosis Date   Anemia in CKD (chronic kidney disease)    Anxiety    Dementia with behavioral disturbance ([place])    Diabetes mellitus with neuropathy (HCC)    Diabetic neuropathy (HCC)    DJD (degenerative joint disease)    GERD (gastroesophageal reflux disease)    H/O aortic valve replacement    Hyperlipidemia    Hypertension    Neuromuscular disorder (HCC)    neuropathy in feet   Obesity    Sleep apnea    Stroke ([place])    " light stroke"   Vitamin D deficiency    Wears glasses     Medications:  Medications Prior to Admission  Medication Sig Dispense Refill Last Dose   ALPRAZolam (XANAX) 0.5 MG tablet Take 0.5 mg by mouth every 8 (eight) hours as needed for anxiety (agitation).   04/11/2022   buPROPion (WELLBUTRIN XL) 150 MG 24 hr tablet Take 150 mg by mouth in the morning.   04/13/2022   busPIRone (BUSPAR) 10 MG tablet Take 10 mg by  mouth 2 (two) times daily.   04/13/2022   docusate sodium (COLACE) 100 MG capsule Take 1 capsule (100 mg total) by mouth 2 (two) times daily. 10 capsule 0 04/13/2022   ferrous sulfate 325 (65 FE) MG tablet Take 325 mg by mouth every other day.   04/12/2022   guaifenesin (HUMIBID E) 400 MG TABS tablet Take 400 mg by mouth every 8 (eight) hours as needed (cough/congestion).   04/13/2022   HYDROcodone-acetaminophen (NORCO) 10-325 MG tablet Take 1 tablet by mouth every 6 (six) hours as needed for severe pain. (Patient taking differently: Take 1 tablet by mouth in the morning and at bedtime.) 30 tablet 0 04/13/2022   Multiple Vitamins-Minerals (MULTIVITAMIN WITH MINERALS) tablet Take 1 tablet by mouth in the morning.   04/13/2022   pantoprazole (PROTONIX) 40 MG tablet Take 1 tablet (40 mg total) by mouth daily before breakfast. 30 tablet 5 04/14/2022   polyethylene glycol (MIRALAX / GLYCOLAX) 17 g packet Take 17 g by  mouth daily.   04/13/2022   QUEtiapine (SEROQUEL) 25 MG tablet Take 25 mg by mouth at bedtime.   04/13/2022   simvastatin (ZOCOR) 40 MG tablet Take 40 mg by mouth at bedtime.   04/13/2022   sodium bicarbonate 650 MG tablet Take 650 mg by mouth 3 (three) times daily.   04/13/2022   spironolactone (ALDACTONE) 50 MG tablet Take 50 mg by mouth in the morning.   04/13/2022   torsemide (DEMADEX) 20 MG tablet Take 20 mg by mouth 2 (two) times daily.   04/13/2022   warfarin (COUMADIN) 3 MG tablet Take 1 tablet (3 mg total) by mouth daily.   04/13/2022 at 2000   amoxicillin (AMOXIL) 500 MG capsule Take 2,000 mg by mouth See admin instructions. Take 2000mg  by mouth once daily for 1 day one hour pre-op.      spironolactone (ALDACTONE) 100 MG tablet Take 0.5 tablets (50 mg total) by mouth daily. (Patient not taking: Reported on 04/14/2022)   Not Taking    Assessment: 82 year old female with a history of mechanical aortic valve on warfarin presenting from [facility] with generalized weakness for 3 to 4 days .  Patient had Abdominal pan and CT abdomen showed large areas of presumed hematoma. Patient with extraperitoneal and retroperitoneal bleed. Now stabilized and resumed on anticoagulation conservatively on 2/2 with heparin for mechanical AVR. MD requests heparin level goal 0.3-0.5 with no boluses.   Heparin level 0.34, therapeutic. No bleeding noted Hgb 8.6, stable INR 1.7  MD request restart Coumadin 04/23/22 today. INR 1.6 on 04/19/22, none given since last dose on 04/13/22 PTA. INR on admission 04/14/22 was > 10 => reversed.  MD requested to keep INR around 2.5  Goal of Therapy:  INR 2.5-3.5 Heparin level 0.3-0.5 units/ml Monitor platelets by anticoagulation protocol: Yes   Plan:  Coumadin 3mg  po x 1 today PT-INR daily No heparin boluses Cont IV heparin at 1300 units/hr Check heparin level daily Monitor daily CBC, signs/symptoms of bleeding  Isac Sarna, BS Pharm D, BCPS Clinical Pharmacist 04/24/2022 7:57 AM

## 2022-04-25 ENCOUNTER — Inpatient Hospital Stay (HOSPITAL_COMMUNITY): Payer: Medicare Other

## 2022-04-25 DIAGNOSIS — A419 Sepsis, unspecified organism: Secondary | ICD-10-CM | POA: Diagnosis not present

## 2022-04-25 DIAGNOSIS — Z515 Encounter for palliative care: Secondary | ICD-10-CM | POA: Diagnosis not present

## 2022-04-25 DIAGNOSIS — J690 Pneumonitis due to inhalation of food and vomit: Secondary | ICD-10-CM | POA: Diagnosis not present

## 2022-04-25 DIAGNOSIS — Z7189 Other specified counseling: Secondary | ICD-10-CM | POA: Diagnosis not present

## 2022-04-25 LAB — CBC
HCT: 27.5 % — ABNORMAL LOW (ref 36.0–46.0)
Hemoglobin: 8.3 g/dL — ABNORMAL LOW (ref 12.0–15.0)
MCH: 31.7 pg (ref 26.0–34.0)
MCHC: 30.2 g/dL (ref 30.0–36.0)
MCV: 105 fL — ABNORMAL HIGH (ref 80.0–100.0)
Platelets: 139 10*3/uL — ABNORMAL LOW (ref 150–400)
RBC: 2.62 MIL/uL — ABNORMAL LOW (ref 3.87–5.11)
RDW: 19.2 % — ABNORMAL HIGH (ref 11.5–15.5)
WBC: 5.3 10*3/uL (ref 4.0–10.5)
nRBC: 0 % (ref 0.0–0.2)

## 2022-04-25 LAB — PROTIME-INR
INR: 2.1 — ABNORMAL HIGH (ref 0.8–1.2)
Prothrombin Time: 23.1 seconds — ABNORMAL HIGH (ref 11.4–15.2)

## 2022-04-25 LAB — HEPARIN LEVEL (UNFRACTIONATED)
Heparin Unfractionated: 0.18 IU/mL — ABNORMAL LOW (ref 0.30–0.70)
Heparin Unfractionated: 0.25 IU/mL — ABNORMAL LOW (ref 0.30–0.70)

## 2022-04-25 LAB — BASIC METABOLIC PANEL
Anion gap: 7 (ref 5–15)
BUN: 37 mg/dL — ABNORMAL HIGH (ref 8–23)
CO2: 27 mmol/L (ref 22–32)
Calcium: 8.4 mg/dL — ABNORMAL LOW (ref 8.9–10.3)
Chloride: 105 mmol/L (ref 98–111)
Creatinine, Ser: 1.16 mg/dL — ABNORMAL HIGH (ref 0.44–1.00)
GFR, Estimated: 46 mL/min — ABNORMAL LOW (ref >60)
Glucose, Bld: 106 mg/dL — ABNORMAL HIGH (ref 70–99)
Potassium: 3.5 mmol/L (ref 3.5–5.1)
Sodium: 139 mmol/L (ref 135–145)

## 2022-04-25 MED ORDER — WARFARIN SODIUM 2.5 MG PO TABS
2.5000 mg | ORAL_TABLET | Freq: Once | ORAL | Status: AC
  Administered 2022-04-25: 2.5 mg via ORAL
  Filled 2022-04-25: qty 1

## 2022-04-25 NOTE — Progress Notes (Signed)
Physical Therapy Treatment Patient Details Name: Mary [name] MRN: [id number] DOB: 02-18-1941 Today's Date: 04/25/2022   History of Present Illness Mary [name] is a 82 year old female with a history of dementia, hypertension, hyperlipidemia, CKD stage III, anxiety, mechanical aortic valve, stroke, diastolic CHF, and anxiety presenting from [facility] with generalized weakness for 3 to 4 days.  She has had associated decreased oral intake and nausea.  The patient is a difficult historian.  The patient's spouse is at the bedside.  He is also a difficult historian.  Nevertheless, it appears that the patient was treated for pneumonia.  Review of the [facility] from [place] shows the patient was started on levofloxacin on 04/08/2022 for 5-day course.  Even with the antibiotics, the patient continued to have progressive generalized weakness.  She denies any headache, neck pain, chest pain, hemoptysis, vomiting, diarrhea.  She does complain of abdominal pain.  She states that the abdominal pain has been present for 3 to 4 days.  There is no hematochezia or melena.  She has some dyspnea with exertion.    PT Comments    Patient presents lethargic, but became more alert after verbal/tactile cueing and able to participate with therapy.  Patient demonstrates slow labored movement for sitting up at bedside with limited use of BLE due to weakness, once seated had difficulty maintaining sitting balance due to posterior leaning, required active assistance for completing BLE exercises and unable to stand using RW due to BLE weakness.  Patient put back to bed with Max assist to reposition.  Patient will benefit from continued skilled physical therapy in hospital and recommended venue below to increase strength, balance, endurance for safe ADLs and gait.     Recommendations for follow up therapy are one component of a multi-disciplinary discharge planning process, led by the attending physician.  Recommendations may be updated  based on patient status, additional functional criteria and insurance authorization.  Follow Up Recommendations  Skilled nursing-short term rehab (<3 hours/day) Can patient physically be transported by private vehicle: No   Assistance Recommended at Discharge Intermittent Supervision/Assistance  Patient can return home with the following A lot of help with bathing/dressing/bathroom;A lot of help with walking and/or transfers;Help with stairs or ramp for entrance;Assistance with cooking/housework   Equipment Recommendations  None recommended by PT    Recommendations for Other Services       Precautions / Restrictions Precautions Precautions: Fall Restrictions Weight Bearing Restrictions: No     Mobility  Bed Mobility Overal bed mobility: Needs Assistance Bed Mobility: Supine to Sit, Sit to Supine     Supine to sit: Max assist Sit to supine: Mod assist, Max assist   General bed mobility comments: had diffiuclty using BUE due to weakness and fragile skin    Transfers Overall transfer level: Needs assistance Equipment used: 1 person hand held assist Transfers: Sit to/from Stand Sit to Stand: Max assist           General transfer comment: unable to fully stand due to BLE weakness    Ambulation/Gait                   Stairs             Wheelchair Mobility    Modified Rankin (Stroke Patients Only)       Balance Overall balance assessment: Needs assistance Sitting-balance support: Feet supported, No upper extremity supported Sitting balance-Leahy Scale: Poor Sitting balance - Comments: fair/poor seated at EOB Postural control: Posterior lean  Cognition Arousal/Alertness: Awake/alert, Lethargic Behavior During Therapy: WFL for tasks assessed/performed Overall Cognitive Status: Within Functional Limits for tasks assessed                                          Exercises  General Exercises - Lower Extremity Ankle Circles/Pumps: Seated, AROM, Strengthening, Both, 10 reps Long Arc Quad: Seated, AAROM, Strengthening, Both, 10 reps Hip Flexion/Marching: Seated, AAROM, Strengthening, Both, 10 reps    General Comments        Pertinent Vitals/Pain Pain Assessment Pain Assessment: Faces Faces Pain Scale: Hurts little more Pain Location: BLE with movement Pain Descriptors / Indicators: Grimacing, Guarding, Sore Pain Intervention(s): Limited activity within patient's tolerance, Monitored during session, Repositioned    Home Living                          Prior Function            PT Goals (current goals can now be found in the care plan section) Acute Rehab PT Goals Patient Stated Goal: return to ALF PT Goal Formulation: With patient/family Time For Goal Achievement: 05/05/22 Potential to Achieve Goals: Fair    Frequency    Min 3X/week      PT Plan Current plan remains appropriate    Co-evaluation              AM-PAC PT "6 Clicks" Mobility   Outcome Measure  Help needed turning from your back to your side while in a flat bed without using bedrails?: A Lot Help needed moving from lying on your back to sitting on the side of a flat bed without using bedrails?: A Lot Help needed moving to and from a bed to a chair (including a wheelchair)?: Total Help needed standing up from a chair using your arms (e.g., wheelchair or bedside chair)?: Total Help needed to walk in hospital room?: Total Help needed climbing 3-5 steps with a railing? : Total 6 Click Score: 8    End of Session Equipment Utilized During Treatment: Oxygen Activity Tolerance: Patient tolerated treatment well;Patient limited by fatigue Patient left: in bed;with call bell/phone within reach;with family/visitor present Nurse Communication: Mobility status PT Visit Diagnosis: Unsteadiness on feet (R26.81);Other abnormalities of gait and mobility (R26.89);Muscle  weakness (generalized) (M62.81)     Time: 5697-9480 PT Time Calculation (min) (ACUTE ONLY): 24 min  Charges:  $Therapeutic Exercise: 8-22 mins $Therapeutic Activity: 8-22 mins                     11:12 AM, 04/25/22 Lonell Grandchild, MPT Physical Therapist with [facility] [phone] office [phone] mobile phone

## 2022-04-25 NOTE — Progress Notes (Signed)
PROGRESS NOTE  Mary [name] DZH:[id number] DOB: 01/15/1941 DOA: 04/14/2022 PCP: Pcp, No  Brief History:  82 year old female with a history of dementia, hypertension, hyperlipidemia, CKD stage III, anxiety, mechanical aortic valve, stroke, diastolic CHF, and anxiety presenting from [facility] with generalized weakness for 3 to 4 days.  She has had associated decreased oral intake and nausea.  The patient is a difficult historian.  The patient's spouse is at the bedside.  He is also a difficult historian.  Nevertheless, it appears that the patient was treated for pneumonia.  Review of the [facility] from [place] shows the patient was started on levofloxacin on 04/08/2022 for 5-day course.  Even with the antibiotics, the patient continued to have progressive generalized weakness.  She denies any headache, neck pain, chest pain, hemoptysis, vomiting, diarrhea.  She does complain of abdominal pain.  She states that the abdominal pain has been present for 3 to 4 days.  There is no hematochezia or melena.  She has some dyspnea with exertion. In the ED, the patient was afebrile hemodynamically stable with oxygen saturation 99% on room air.  WBC 10.8, hemoglobin 9.3, platelets 302,000.  Sodium 135, potassium 4.4, bicarbonate 24, serum creatinine 1.77.  The patient was started on IV ceftriaxone and azithromycin and IV fluids.  She was admitted for further evaluation and treatment.   Assessment/Plan: SIRS -concerned about intra-abdominal source of infection initially -CT abd--- Large areas of presumed hematoma involving the rectus abdominis muscles bilaterally, left-greater-than-right. There is also large extraperitoneal hematoma along the margin of the iliopsoas muscle on the left. -presented with leukocytosis, tachycardia, elevated lactate -follow blood culture--neg to date -UA neg for pyuria -Lactic acid peaked 2.2 -Continue IV fluids>>saline locked -Treated with IV Zosyn and subsequently transition  to Augmentin on 04/18/2022; patient has completed antibiotic therapy at this moment. -No fever, normal WBCs. -Chest x-ray on 04/20/2022 demonstrating patchy infiltrates suggestive of aspiration. -sepsis ruled out.   Abdominal pain/extraperitoneal/retroperitoneal bleed/ Acute Blood Loss Anemia -CT abdomen and pelvis--- Large areas of presumed hematoma involving the rectus abdominismuscles bilaterally, left-greater-than-right. There is also largeextraperitoneal hematoma along the margin of the iliopsoas muscle on the left -Status post 8 units PRBC transfused throughout hospitalization. -Patient has also received fresh frozen plasma transfusion. -Continue fentanyl prn pain -Continue to follow hemoglobin trend. -Latest hemoglobin has remained stable at 8.3 -Continue to follow trend. -Continue to follow hemoglobin trend and Coumadin level.   Supratherapeutic INR -Likely secondary to the patient's recent use of levofloxacin and decreased oral intake in the setting of warfarin use -Vitamin K 20 mg total given for the admission -9 units FFP given  total (last one ordered 1/30) -currently subtherapeutic and stable. -Heparin drip started. -Hemoglobin stable; no signs of overt bleeding. -Will restart Coumadin.  Aiming for INR of 2.5. -INR currently 2.1   Status post mechanical aortic valve replacement -Continue anticoagulation with INR goal of 2.5-3.5 -Continue holding warfarin temporarily secondary to supratherapeutic INR -Now that patient's hemoglobin has remained stable; will continue the use of heparin drip and restart Coumadin for INR goal of 2.5.   Aspiration pneumonitis -initially started on zosyn -Successfully transitioned to amox/clav>> planning for 1 more days to finish abx therapy. -Continue the use of Mucinex, flutter valve and as needed bronchodilator.   Essential hypertension -Vital signs stable -Continue to follow blood pressure trend and resume antihypertensive agents as  required.   Dementia without behavioral disturbance -At risk for hospital delirium -Continue constant reorientation.  Mixed hyperlipidemia -Resume the use of simvastatin at time of discharge. -Continue heart healthy diet.   Anxiety/depression -Continue BuSpar   Acute on chronic renal failure--CKD stage IV -Renal function has improved and back to baseline at this moment (creatinine 1.39) -due to hemodynamic changes, IV contrast -Good response to Lasix given; no crackles appreciated on examination.  Still with some signs of fluid overload but improving. -Continue to monitor daily basic metabolic panel. -Renal function has remained stable.   Thrombocytopenia -due to acute medical illness -Continue to follow platelet counts trend/stability.   Hypokalemia -In the setting of GI losses and decreased oral intake -Magnesium within normal limit -Continue to follow electrolytes trend and further replete as needed. -Stable overall.  Diarrhea -C. difficile PCR negative -Most likely triggered by the use of antibiotics -Continue supportive care. -Continue treatment with Florastor and as needed Imodium.  Dysphagia/weakness and deconditioning -CT scan not demonstrating not acute ischemic process -Most likely secondary to aspiration and vocal cord dysfunction -Continue supportive care -Continue to follow recommendations by speech therapy. -Physical therapy recommending skilled nursing facility for rehabilitation at discharge. -Planning for discharge once INR therapeutic. -Continue feeding supplements.   Goals of Care -confirmed with patient, daughter, spouse>>DNR -Continue current goals of care. -Physical therapy evaluation will be requested -Overall prognosis and future recovery guarded.      Family Communication:   Patient's husband updated at bedside 04/20/2022; patient's daughter updated over the phone 04/21/2022.   Consultants:  none   Code Status:  DNR   DVT Prophylaxis:   SCDs     Procedures: As Listed in Progress Note Above   Antibiotics: Zosyn 1/26>>1/29 Amox/clav 1/29>>04/24/22 Azithro 1/26>>1/28   Subjective: Chronically ill, frail significant deconditioning appreciated on exam.  Tolerating dysphagia 1 and nectar thick liquids; but requiring to be fed due to generalized weakness.  No overt bleeding.  1.5 L nasal cannula supplementation in place.  Objective: Vitals:   04/24/22 1858 04/24/22 2307 04/25/22 0408 04/25/22 1441  BP: 135/72 (!) 129/54 122/64 (!) 142/59  Pulse: 90 92 91 92  Resp: 17 20 (!) 24   Temp: 97.6 F (36.4 C) 98.2 F (36.8 C) 97.6 F (36.4 C) 98.8 F (37.1 C)  TempSrc: Oral Axillary Oral   SpO2: 98% 99% 99% 100%  Weight:      Height:        Intake/Output Summary (Last 24 hours) at 04/25/2022 1719 Last data filed at 04/25/2022 1554 Gross per 24 hour  Intake 475.17 ml  Output 1 ml  Net 474.17 ml   Weight change:   Exam: General exam: Alert, awake, oriented x 3; chronically ill and frail; in no acute distress.  Significantly deconditioned, reporting poor appetite and in need to be fed at this moment due to generalized weakness. Respiratory system: Positive rhonchi bilaterally, no using accessory muscles. 1.5 L nasal cannula supplementation Cardiovascular system:RRR. No rubs or gallops; positive metallic click appreciated on auscultation.  No JVD. Gastrointestinal system: Abdomen is slightly distended; mild tenderness to palpation.  Positive increased abdominal girth.  Hematoma/bruises in her flanks area reabsorbing. Central nervous system: No new focal neurological deficits. Extremities: No cyanosis or clubbing; trace to 1+ edema appreciated bilaterally. Skin: No petechiae. Psychiatry: Judgement and insight appear normal. Mood & affect appropriate.    Data Reviewed: I have personally reviewed following labs and imaging studies  Basic Metabolic Panel: Recent Labs  Lab 04/19/22 0421 04/20/22 0421 04/22/22 0509  04/25/22 0433  NA 137 136 138 139  K 3.1* 3.6 3.8  3.5  CL 106 102 106 105  CO2 26 25 26 27   GLUCOSE 92 116* 103* 106*  BUN 40* 36* 33* 37*  CREATININE 1.47* 1.39* 1.16* 1.16*  CALCIUM 8.3* 8.4* 8.4* 8.4*  MG 2.2  --   --   --    Coagulation Profile: Recent Labs  Lab 04/19/22 0421 04/24/22 0442 04/25/22 0433  INR 1.6* 1.7* 2.1*   CBC: Recent Labs  Lab 04/20/22 0421 04/22/22 0509 04/23/22 0444 04/24/22 0442 04/25/22 0433  WBC 8.2 8.5 6.8 5.5 5.3  HGB 9.5* 8.7* 8.6* 8.6* 8.3*  HCT 30.2* 28.2* 28.2* 28.3* 27.5*  MCV 101.3* 103.7* 104.8* 104.8* 105.0*  PLT 109* 114* 114* 124* 139*   Urine analysis:    Component Value Date/Time   COLORURINE YELLOW 04/14/2022 2200   APPEARANCEUR CLEAR 04/14/2022 2200   LABSPEC >1.046 (H) 04/14/2022 2200   PHURINE 5.0 04/14/2022 2200   GLUCOSEU NEGATIVE 04/14/2022 2200   HGBUR NEGATIVE 04/14/2022 2200   BILIRUBINUR NEGATIVE 04/14/2022 2200   KETONESUR NEGATIVE 04/14/2022 2200   PROTEINUR NEGATIVE 04/14/2022 2200   UROBILINOGEN 2.0 (H) 11/01/2007 1518   NITRITE NEGATIVE 04/14/2022 2200   LEUKOCYTESUR NEGATIVE 04/14/2022 2200   Sepsis Labs:  Recent Results (from the past 240 hour(s))  C Difficile Quick Screen (NO PCR Reflex)     Status: None   Collection Time: 04/18/22  4:52 AM   Specimen: STOOL  Result Value Ref Range Status   C Diff antigen NEGATIVE NEGATIVE Final   C Diff toxin NEGATIVE NEGATIVE Final   C Diff interpretation No C. difficile detected.  Final    Comment: Performed at [facility], [address] [place], [place] [address]    Scheduled Meds:  buPROPion  150 mg Oral q AM   busPIRone  10 mg Oral BID   Chlorhexidine Gluconate Cloth  6 each Topical Daily   mouth rinse  15 mL Mouth Rinse 4 times per day   pantoprazole (PROTONIX) IV  40 mg Intravenous Q12H   saccharomyces boulardii  250 mg Oral BID   simvastatin  40 mg Oral q1800   sodium bicarbonate  650 mg Oral TID   sodium chloride flush  10-40 mL  Intracatheter Q12H   Warfarin - Pharmacist Dosing Inpatient   Does not apply q1600   Continuous Infusions:  heparin 1,450 Units/hr (04/25/22 1709)   Procedures/Studies: CT HEAD WO CONTRAST (5MM)  Result Date: 04/21/2022 CLINICAL DATA:  Neuro deficit.  Stroke suspected. EXAM: CT HEAD WITHOUT CONTRAST TECHNIQUE: Contiguous axial images were obtained from the base of the skull through the vertex without intravenous contrast. RADIATION DOSE REDUCTION: This exam was performed according to the departmental dose-optimization program which includes automated exposure control, adjustment of the mA and/or kV according to patient size and/or use of iterative reconstruction technique. COMPARISON:  CT Brain 09/23/21 FINDINGS: Brain: No evidence of acute infarction, hemorrhage, hydrocephalus, extra-axial collection or mass lesion/mass effect. Sequela of moderate to severe chronic microvascular ischemic change. Chronic small right cerebellar infarct. Vascular: No hyperdense vessel or unexpected calcification. Skull: Normal. Negative for fracture or focal lesion. Sinuses/Orbits: Trace right mastoid effusion. No middle ear effusion. There is impacted cerumen in the left EAC. Right lens replacement. Redemonstrated mucosal thickening in the right maxillary sinus and dense inspissated secretions or chronic fungal colonization. Other: None IMPRESSION: 1. No hemorrhage or CT evidence of an acute cortical infarct. 2. Sequela of moderate to severe chronic microvascular ischemic change. Chronic small right cerebellar infarct. Electronically Signed   By:  Marin Roberts M.D.   On: 04/21/2022 15:27   DG CHEST PORT 1 VIEW  Result Date: 04/20/2022 CLINICAL DATA:  Aspiration. EXAM: PORTABLE CHEST 1 VIEW COMPARISON:  04/14/2022 FINDINGS: Right arm PICC line tip terminates in the distal SVC. Previous median sternotomy. Stable mild cardiac enlargement. Persistent small bilateral pleural effusions, right greater than left. New bilateral lower  lobe zone peribronchovascular opacities are identified along with scattered areas of subsegmental atelectasis. IMPRESSION: 1. New bilateral lower lobe peribronchovascular opacities compatible with aspiration. 2. Persistent small bilateral pleural effusions, right greater than left. Electronically Signed   By: Kerby Moors M.D.   On: 04/20/2022 09:44   [place] EKG SITE RITE  Result Date: 04/15/2022 If Site Rite image not attached, placement could not be confirmed due to current cardiac rhythm.  CT Angio Abd/Pel w/ and/or w/o  Result Date: 04/14/2022 CLINICAL DATA:  Retroperitoneal bleed suspected, extraperitoneal bleed. EXAM: CTA ABDOMEN AND PELVIS WITHOUT AND WITH CONTRAST TECHNIQUE: Multidetector CT imaging of the abdomen and pelvis was performed using the standard protocol during bolus administration of intravenous contrast. Multiplanar reconstructed images and MIPs were obtained and reviewed to evaluate the vascular anatomy. RADIATION DOSE REDUCTION: This exam was performed according to the departmental dose-optimization program which includes automated exposure control, adjustment of the mA and/or kV according to patient size and/or use of iterative reconstruction technique. CONTRAST:  51mL OMNIPAQUE IOHEXOL 350 MG/ML SOLN COMPARISON:  Noncontrast CT earlier today. FINDINGS: VASCULAR Aorta: Moderate atherosclerosis. No aneurysm, dissection or acute findings. Celiac: Calcified plaque at the origin causes mild stenosis. Distal branch vessels are patent. No aneurysm, dissection or acute findings. SMA: Mild diffuse atheromatous plaque with mild areas of stenosis. No aneurysm or acute findings. Renals: Mild plaque in the proximal renal arteries causing varying degrees of stenosis up to moderate. No aneurysm or acute findings. IMA: Patent. Inflow: Mild atherosclerosis without acute findings. Proximal Outflow: Bilateral common femoral and visualized portions of the superficial and profunda femoral arteries are  patent without evidence of aneurysm, dissection, vasculitis or significant stenosis. Retroperitoneal and extraperitoneal hematomas. There is no active arterial extravasation within any of the retroperitoneal or extraperitoneal bleed. Veins: Venous phase imaging demonstrates patency of the iliac veins and IVC. Portal, splenic and mesenteric veins are patent. No areas of active extravasation or seen on venous phase. Review of the MIP images confirms the above findings. NON-VASCULAR Lower chest: Right pleural effusion with pleural enhancement, unchanged from earlier today. Hepatobiliary: Nodular hepatic contours typical of cirrhosis. Water density there is a 13 mm low-density lesion in the left lobe of the liver series 12, image 21, measuring slightly higher than simple fluid density, nonspecific. Multiple gallstones without evidence of gallbladder inflammation. Pancreas: Parenchymal atrophy. No ductal dilatation or inflammation. Spleen: No splenomegaly. Adrenals/Urinary Tract: No adrenal nodule or hemorrhage. Bilateral renal parenchymal atrophy with multifocal areas of parenchymal scarring. Parenchymal calcifications in the upper pole the right kidney. No hydronephrosis. Displaced to the right due to left pelvic fluid collections. The bladder appears thick walled which is nonspecific. Stomach/Bowel: No bowel obstruction or inflammation. There is mild colonic distension with stool proximally and air distally. The appendix is not seen. Lymphatic: No gross adenopathy. Reproductive: Uterine vascular calcifications again seen. Other: Again seen multiple areas of presume extraperitoneal and retroperitoneal hematoma/hemorrhage. Largest area is in the left pelvis measuring approximately 12.9 x 7 cm with hematocrit level, measured on series 12, image 78. This may be contiguous with the left rectus abdominus musculature. More superiorly is an area of presumed  rectus hematoma measuring 6.1 x 5.1 cm, measured on series 12, image  67. There is stranding in the left retroperitoneum tracking anterior to the iliopsoas muscle that is primarily ill-defined. Stranding within the right extraperitoneal M with ill-defined hemorrhage. Fluid collections within the upper abdominal wall musculature just underneath the anterior ribs, 8.7 x 3.3 cm on the left series 12, image 30, 4.0 x 2.7 cm on the right series 12, image 31. There is no evidence of active extravasation within any of these hematomas. There is edema within the subcutaneous tissues of the left flank/abdominal wall as well as anterior to the lower abdominal wall musculature. Musculoskeletal: Unchanged from earlier today with L4 compression deformity. Left hip arthroplasty. IMPRESSION: 1. No evidence of active extravasation within any of the retroperitoneal or extraperitoneal fluid collections/presumed hematoma. 2. Multiple areas of presume extraperitoneal and retroperitoneal hematoma/hemorrhage, largest area in the left pelvis measuring 12.9 x 7 cm, likely contiguous with the left rectus abdominus musculature. Allowing for differences in caliper placement in technique, no significant change in size of these collections from earlier today. Possibility of infection is considered but felt less likely. 3. Cirrhosis. Indeterminate 13 mm low-density lesion in the left lobe of the liver measuring slightly higher than simple fluid density. Recommend further evaluation with MRI on a nonemergent basis after resolution of acute event. 4. Cholelithiasis without acute cholecystitis. 5. Aortic and branch atherosclerosis with areas of arterial stenosis up to moderate in degree, as described. 6. Additional chronic changes are stable from earlier today. Aortic Atherosclerosis (ICD10-I70.0). Electronically Signed   By: Keith Rake M.D.   On: 04/14/2022 16:41   CT CHEST ABDOMEN PELVIS WO CONTRAST  Result Date: 04/14/2022 CLINICAL DATA:  Generalized pain.  Sepsis.  Nausea. EXAM: CT CHEST, ABDOMEN AND  PELVIS WITHOUT CONTRAST TECHNIQUE: Multidetector CT imaging of the chest, abdomen and pelvis was performed following the standard protocol without IV contrast. RADIATION DOSE REDUCTION: This exam was performed according to the departmental dose-optimization program which includes automated exposure control, adjustment of the mA and/or kV according to patient size and/or use of iterative reconstruction technique. COMPARISON:  Chest x-ray earlier 04/14/2022. Older x-rays. CT scan abdomen pelvis 2017 July FINDINGS: CT CHEST FINDINGS Cardiovascular: Heart is enlarged. Trace pericardial fluid. Prominent calcifications along the mitral valve. Postop chest with prosthetic aortic valve. The thoracic aorta has scattered calcified atherosclerotic plaque. Diameter of the ascending aorta at the level of the right pulmonary artery measures 4.3 by 4.0 cm. Mediastinum/Nodes: Small thyroid gland. No specific abnormal lymph node enlargement present in the axillary region, hila or mediastinum on this noncontrast exam. Prominent subcarinal node is noted measuring 17 by 10 mm on series 2, image 30. Normal caliber thoracic esophagus. Lungs/Pleura: Diffuse breathing motion. Small right-sided pleural effusion identified with some adjacent parenchymal opacities. Associated pleural thickening. This effusion was seen in 2017 and with slightly larger that time. More thickening today of the pleura. Atelectasis versus infiltrate. There is some bandlike areas of opacity as well in the middle lobe. Scattered areas of interstitial septal thickening are identified. There are few patchy ground-glass areas as well in the right upper lobe towards the apex such as series 3, image 42, series 3, image 53. Additional areas scattered in the left upper lobe, middle lobe and superior segment of left lower lobe. This has a differential including atypical or viral process. Recommend follow-up. Musculoskeletal: Osteopenia. Scattered degenerative changes noted  along the spine. Of note the patient's arms were scanned at the patient's side. CT ABDOMEN  PELVIS FINDINGS Hepatobiliary: Nodular contour to the liver. Please correlate for any known history of chronic liver disease. There are multiple stones in the nondilated gallbladder. Pancreas: Moderate pancreatic atrophy. No obvious mass. Appearance is similar to prior. Spleen: Spleen is grossly nonenlarged. Adrenals/Urinary Tract: Adrenal glands are grossly preserved. There is mild bilateral renal atrophy with some focal areas along the midportion left kidney laterally. Some calcifications noted along the hilum of the right kidney which very well could be vascular. No definite ureteral or renal stones otherwise. The bladder is somewhat decompressed with wall thickening but there is mass effect along the bladder from the adjacent fluid collections. Stomach/Bowel: Stomach is mildly distended with fluid. The small bowel is nondilated. Large bowel is nondilated prominent right-sided colonic stool. Again evaluation is limited by significant motion. Vascular/Lymphatic: Diffuse vascular calcifications are identified with areas of significant stenosis suggested along branch vessels including SMA, renal arteries. Normal caliber IVC. Reproductive: Scattered calcifications along the uterus. These could be vascular. No separate adnexal mass. Other: Significant motion noted limited evaluation. Extensive areas of presumed hematoma identified. These include the rectus abdominis muscles bilaterally, left-greater-than-right. The left hematoma extends along the course of the entirety of the rectus abdominis muscle, cephalocaudal length approaching 29 cm. Transverse dimension at the level of the pelvis approximate 7.4 by 4.8 cm. Again the right-sided areas are smaller and are along the lower chest and upper abdomen regions. There is also a large extraperitoneal hematoma more on the left than on the right with mass effect along surrounding  structures including the bladder. This extends along the margin of the iliacus muscle. Longitudinal length on coronal image 89 of series 5 measures a proximally 18.2 cm. Axial dimension maximally approaches 10.5 by 5.5 cm. Anasarca. Musculoskeletal: Streak artifact related to patient's left hip arthroplasty obscuring the surrounding tissues. Curvature and degenerative changes seen along the spine. Osteopenia. Multilevel stenosis identified along the lumbar spine. There is moderate compression of the superior endplate of L4 which was not seen in 2017 per the margins are somewhat sclerotic. Favor a subacute to chronic process. Please correlate with history. There is multilevel disc bulging. Degenerative changes seen along the pelvis. Critical Value/emergent results were called by telephone at the time of interpretation on 04/14/2022 at 1:59 pm to provider DAVID TAT , who verbally acknowledged these results. IMPRESSION: 1. Large areas of presumed hematoma involving the rectus abdominis muscles bilaterally, left-greater-than-right. There is also large extraperitoneal hematoma along the margin of the iliopsoas muscle on the left. This extends along the margin of the bladder with mass effect. 2. Small right-sided pleural effusion with adjacent parenchymal opacities. This effusion was seen in 2017 and with slightly larger that time. More thickening of the pleura today. Atelectasis versus infiltrate. 3. Patchy areas of ground-glass in both lungs. This has a differential including atypical or viral process. Recommend follow-up. 4. Cardiomegaly.  Postop chest. 5. Ascending aortic aneurysm measuring 4.3 x 4.0 cm. 6. Nodular contour to the liver. Please correlate for any known history of chronic liver disease. 7. Cholelithiasis. 8. Diffuse vascular calcifications with areas of significant stenosis along branch vessels including SMA, renal arteries. 9. Moderate compression of the superior endplate of L4, not seen in 2017 per the  margins are somewhat sclerotic. Favor a subacute to chronic process. Please correlate with history. 10. Aortic atherosclerosis. Aortic Atherosclerosis (ICD10-I70.0). Electronically Signed   By: Jill Side M.D.   On: 04/14/2022 14:04   DG Chest 2 View  Result Date: 04/14/2022 CLINICAL DATA:  Fall  EXAM: CHEST - 2 VIEW COMPARISON:  Chest x-ray dated September 23, 2021 FINDINGS: Cardiac and mediastinal contours are unchanged post median sternotomy. Left-greater-than-right pleural effusions. No evidence of pneumothorax. Chronic appearing anterior second right rib fracture. IMPRESSION: Left-greater-than-right pleural effusions bibasilar atelectasis, similar to prior. Electronically Signed   By: Yetta Glassman M.D.   On: 04/14/2022 10:04    Barton Dubois, MD  [facility]  If 7PM-7AM, please contact night-coverage [email or web address] Password [email or web address] 04/25/2022, 5:19 PM   LOS: 11 days

## 2022-04-25 NOTE — TOC Progression Note (Signed)
Transition of Care [facility]) - Progression Note    Patient Details  Name: SHAHD OCCHIPINTI MRN: [id number] Date of Birth: September 18, 1940  Transition of Care [facility]) CM/SW Contact  Salome Arnt, [place] Phone Number: 04/25/2022, 10:26 AM  Clinical Narrative: Per MD, possible d/c tomorrow. [facility] updated. CMA started authorization. TOC will follow.       Expected Discharge Plan: [place] Barriers to Discharge: Continued Medical Work up  Expected Discharge Plan and Services In-house Referral: Clinical Social Work Discharge Planning Services: CM Consult Post Acute Care Choice: [place] Living arrangements for the past 2 months: [place]                                       Social Determinants of Health (SDOH) Interventions SDOH Screenings   Food Insecurity: No Food Insecurity (04/15/2022)  Housing: Low Risk  (04/15/2022)  Transportation Needs: No Transportation Needs (04/15/2022)  Utilities: Not At Risk (04/15/2022)  Tobacco Use: Low Risk  (04/14/2022)    Readmission Risk Interventions    04/16/2022   12:03 PM 12/19/2020    2:33 PM  Readmission Risk Prevention Plan  Transportation Screening Complete Complete  Medication Review Press photographer) Complete Complete  HRI or Home Care Consult Complete Complete  SW Recovery Care/Counseling Consult Complete Complete  Palliative Care Screening Not Applicable Not Applicable  Skilled Nursing Facility Complete Complete

## 2022-04-25 NOTE — Evaluation (Signed)
Modified Barium Swallow Study  Patient Details  Name: Mary [name] MRN: [id number] Date of Birth: 29-Mar-1940  Today's Date: 04/25/2022  Modified Barium Swallow completed.  Full report located under Chart Review in the Imaging Section.  History of Present Illness 82 year old female with a history of dementia, hypertension, hyperlipidemia, CKD stage III, anxiety, mechanical aortic valve, stroke, diastolic CHF, and anxiety presenting from [facility] with generalized weakness for 3 to 4 days.  She has had associated decreased oral intake and nausea.  The patient is a difficult historian.  The patient's spouse is at the bedside.  He is also a difficult historian.  Nevertheless, it appears that the patient was treated for pneumonia.  Review of the [facility] from [place] shows the patient was started on levofloxacin on 04/08/2022 for 5-day course.  Even with the antibiotics, the patient continued to have progressive generalized weakness.  She denies any headache, neck pain, chest pain, hemoptysis, vomiting, diarrhea.  She does complain of abdominal pain.  She states that the abdominal pain has been present for 3 to 4 days.  There is no hematochezia or melena.  She has some dyspnea with exertion.  In the ED, the patient was afebrile hemodynamically stable with oxygen saturation 99% on room air.  WBC 10.8, hemoglobin 9.3, platelets 302,000.  Sodium 135, potassium 4.4, bicarbonate 24, serum creatinine 1.77.  The patient was started on IV ceftriaxone and azithromycin and IV fluids.  She was admitted for further evaluation and treatment. On 04/20/22 Acute respiratory distress while eating breakfast experiencing choking event.  Transient use of nonrebreather and subsequently 5 L nasal cannula supplementation. BSE requested.   Clinical Impression Pt presents with mild oral dysphagia and mild/moderate pharyngeal dysphagia characterized by mildly decreased oral coordination, decreased epiglottic deflection, decreased  laryngeal vestibule closure resulting in trace to min amonts of aspiration of thin liquids. Aspiration does not occur on every presentation of thin liquids, however pt is unable to complete compensatory strategies to minimize/eliminate aspiration and cough is not effective in clearing aspirates. Pt continues to be dependent on a feeder for all PO increasing her risk of aspiration. Regular, puree and NTL were noted to be [facility] with improved epiglottic deflection noted with heavier bolus presentations.  Pt continues to be very weak and lethargic negatively impacting the rate and ability to fully masticate solids. Recommend continue with current diet of D1/puree and NTL. ST will continue to follow acutely for ongoing dysphagia therapy. Pt is a good candidate for free water protocol.  Factors that may increase risk of adverse event in presence of aspiration ([place]ger 2021): Limited mobility;Frail or deconditioned;Dependence for feeding and/or oral hygiene;Weak cough  Swallow Evaluation Recommendations Recommendations: PO diet PO Diet Recommendation: Dysphagia 1 (Pureed);Mildly thick liquids (Level 2, nectar thick) Liquid Administration via: Cup;Straw Medication Administration: Whole meds with puree Supervision: Full assist for feeding Swallowing strategies  : Slow rate;Small bites/sips Postural changes: Position pt fully upright for meals Oral care recommendations: Oral care BID (2x/day);Oral care before ice chips/water Caregiver Recommendations: Avoid jello, ice cream, thin soups, popsicles;Remove water pitcher    Nicha Hemann H. Roddie Mc, CCC-SLP Speech Language Pathologist   Wende Bushy 04/25/2022,6:07 PM

## 2022-04-25 NOTE — Progress Notes (Addendum)
[facility] for Heparin and Coumadin Indication: mechanical aortic valve   Allergies  Allergen Reactions   Tape Itching    Redness, Please use "paper" tape only    Patient Measurements: Height: 5\' 7"  (170.2 cm) Weight: 66.5 kg (146 lb 9.7 oz) IBW/kg (Calculated) : 61.6 HEPARIN DW (KG): 63.7   Vital Signs: Temp: 97.6 F (36.4 C) (02/06 0408) Temp Source: Oral (02/06 0408) BP: 122/64 (02/06 0408) Pulse Rate: 91 (02/06 0408)  Labs: Recent Labs    04/23/22 0444 04/23/22 0719 04/24/22 0442 04/25/22 0433  HGB 8.6*  --  8.6* 8.3*  HCT 28.2*  --  28.3* 27.5*  PLT 114*  --  124* 139*  LABPROT  --   --  19.4* 23.1*  INR  --   --  1.7* 2.1*  HEPARINUNFRC  --  0.33 0.34 0.18*  CREATININE  --   --   --  1.16*     Estimated Creatinine Clearance: 35.1 mL/min (A) (by C-G formula based on SCr of 1.16 mg/dL (H)).   Medical History: Past Medical History:  Diagnosis Date   Anemia in CKD (chronic kidney disease)    Anxiety    Dementia with behavioral disturbance (HCC)    Diabetes mellitus with neuropathy (HCC)    Diabetic neuropathy (HCC)    DJD (degenerative joint disease)    GERD (gastroesophageal reflux disease)    H/O aortic valve replacement    Hyperlipidemia    Hypertension    Neuromuscular disorder (HCC)    neuropathy in feet   Obesity    Sleep apnea    Stroke ([place])    " light stroke"   Vitamin D deficiency    Wears glasses     Assessment: 82 year old female with a history of mechanical aortic valve on warfarin presenting from [facility] with generalized weakness for 3 to 4 days. Patient found to have extraperitoneal and retroperitoneal bleed with INR reversed. Now stabilized and resumed on anticoagulation conservatively on 2/2 with heparin for mechanical AVR. MD requests heparin level goal 0.3-0.5 with no boluses. Warfarin resumed on 2/4 with goal ~2.5.   Heparin level dropped to below goal this morning to 0.18 on 1300  units/hr. INR is up to 2.1. Hemoglobin stable at 8.3, platelet count within normal limits. No bleeding or IV issues noted.   Goal of Therapy:  INR 2.5-3.5 (~2.5) Heparin level 0.3-0.5 units/ml Monitor platelets by anticoagulation protocol: Yes   Plan:  Coumadin 2.5 mg po x 1 today Increase heparin to 1400 units/hr Recheck heparin level in 6-8 hours Daily INR, CBC, and heparin level   Addendum:  Afternoon heparin level is up to 0.25, just slightly below goal. Will make small rate adjustment and follow up with am labs.   Erin Hearing PharmD., BCPS Clinical Pharmacist 04/25/2022 7:48 AM

## 2022-04-25 NOTE — Progress Notes (Signed)
Palliative: Mrs. Sylvan is lying quietly in bed.  She looks acutely/chronically ill and very frail, older than stated age.  She will make an somewhat keep eye contact.  She is alert, oriented to person and situation.  I believe that she can make her basic needs known.  There is no family at bedside at this time.  I offer Mrs. Shook something to eat.  Initially, she declines.  I offer her vanilla pudding which she is able to eat without overt signs and symptoms of aspiration.  I ask if she can hold the pudding cup, and she tells me that she cannot.  She is barely able to raise her arm from the bed.  She clearly is in a much weakened state and would benefit from rehab.  Transition of care team is working with husband of 11 years, Kela Millin, placement at [facility].    Conference with attending, bedside nursing staff, transition of care team related to patient condition, needs, goals of care, disposition.  Plan: Continue to treat the treatable but no CPR or intubation.  Short-term rehab at [place] with ultimate goal of returning home.  25 minutes Quinn Axe, NP Palliative medicine team Team phone [phone] Greater than 50% of this time was spent counseling and coordinating care related to the above assessment and plan.

## 2022-04-26 DIAGNOSIS — E43 Unspecified severe protein-calorie malnutrition: Secondary | ICD-10-CM | POA: Insufficient documentation

## 2022-04-26 LAB — CBC
HCT: 30.1 % — ABNORMAL LOW (ref 36.0–46.0)
Hemoglobin: 8.9 g/dL — ABNORMAL LOW (ref 12.0–15.0)
MCH: 31.4 pg (ref 26.0–34.0)
MCHC: 29.6 g/dL — ABNORMAL LOW (ref 30.0–36.0)
MCV: 106.4 fL — ABNORMAL HIGH (ref 80.0–100.0)
Platelets: 151 10*3/uL (ref 150–400)
RBC: 2.83 MIL/uL — ABNORMAL LOW (ref 3.87–5.11)
RDW: 18.8 % — ABNORMAL HIGH (ref 11.5–15.5)
WBC: 5.7 10*3/uL (ref 4.0–10.5)
nRBC: 0 % (ref 0.0–0.2)

## 2022-04-26 LAB — PROTIME-INR
INR: 3.1 — ABNORMAL HIGH (ref 0.8–1.2)
Prothrombin Time: 31.3 seconds — ABNORMAL HIGH (ref 11.4–15.2)

## 2022-04-26 LAB — HEPARIN LEVEL (UNFRACTIONATED): Heparin Unfractionated: 0.5 IU/mL (ref 0.30–0.70)

## 2022-04-26 NOTE — Progress Notes (Signed)
Palliative: Mary [name] is resting quietly in bed.  She appears acutely/chronically ill and very frail.  She is resting comfortably, but wakes easily when I touch her arm.  She will make and mostly keep eye contact.  She is oriented to self.  I believe that she can make her needs known.  There is no family at bedside at this time.  Call to husband of 61 years, Mary [name].  We talk about Mary [name] acute illness and her weakness.  I encourage Mary [name] to work closely with Education officer, museum at [place] for discharge planning when leaving rehab.  Mary [name] shares that he expects that she will return to assisted living [place] facility in May at a.m. where she has been for about a year.  Conference with attending, bedside nursing staff, transition of care team related to patient condition, needs, goals of care, disposition.  Plan: At this point continue to treat the treatable but no CPR or intubation.  Short-term rehab at [facility] with ultimate goal of returning to ALF in [place] where she has been for about 1 year.    DNR/goldenrod form completed and placed on chart.  24 minutes  Quinn Axe, NP Palliative medicine team Team phone [phone] Greater than 50% of this time was spent counseling and coordinating care related to the above assessment and plan.

## 2022-04-26 NOTE — Progress Notes (Signed)
PROGRESS NOTE  Mary [name] UXL:[id number] DOB: [birth year] DOA: 04/14/2022 PCP: Pcp, No  Brief History:  82 year old female with a history of dementia, hypertension, hyperlipidemia, CKD stage III, anxiety, mechanical aortic valve, stroke, diastolic CHF, and anxiety presenting from [facility] with generalized weakness for 3 to 4 days.  She has had associated decreased oral intake and nausea.  The patient is a difficult historian.  The patient's spouse is at the bedside.  He is also a difficult historian.  Nevertheless, it appears that the patient was treated for pneumonia.  Review of the [facility] from [place] shows the patient was started on levofloxacin on 04/08/2022 for 5-day course.  Even with the antibiotics, the patient continued to have progressive generalized weakness.  She denies any headache, neck pain, chest pain, hemoptysis, vomiting, diarrhea.  She does complain of abdominal pain.  She states that the abdominal pain has been present for 3 to 4 days.  There is no hematochezia or melena.  She has some dyspnea with exertion. In the ED, the patient was afebrile hemodynamically stable with oxygen saturation 99% on room air.  WBC 10.8, hemoglobin 9.3, platelets 302,000.  Sodium 135, potassium 4.4, bicarbonate 24, serum creatinine 1.77.  The patient was started on IV ceftriaxone and azithromycin and IV fluids.  She was admitted for further evaluation and treatment.   Assessment/Plan: 1)SIRS- due to extensive intra-abdominal hemorrhage -CT abd--- Large areas of presumed hematoma involving the rectus abdominis muscles bilaterally, left-greater-than-right. There is also large extraperitoneal hematoma along the margin of the iliopsoas muscle on the left. -presented with leukocytosis, tachycardia, elevated lactate -Blood cultures NGTD -UA neg for pyuria -Treated with IV Zosyn and subsequently transition to Augmentin on 04/18/2022; patient has completed antibiotic therapy at this moment. -    2)Abdominal pain/extraperitoneal/retroperitoneal bleed/ Acute Blood Loss Anemia -CT abdomen and pelvis--- Large areas of presumed hematoma involving the rectus abdominismuscles bilaterally, left-greater-than-right. There is also largeextraperitoneal hematoma along the margin of the iliopsoas muscle on the left -Status post 8 units PRBC transfused throughout hospitalization. -Patient has also received fresh frozen plasma transfusion. -Continue fentanyl prn pain -Continue to follow hemoglobin trend. -Latest hemoglobin has remained stable >> 8 -Continue to follow trend. -Continue to follow hemoglobin trend and Coumadin level.   3)Supratherapeutic INR -Likely secondary to the patient's concomitant use of levofloxacin and decreased oral intake in the setting of warfarin use -Vitamin K 20 mg total given for the admission -9 units FFP given  total (last one ordered 04/18/22) -currently subtherapeutic and stable. -Heparin drip started. -Hemoglobin stable; no signs of overt bleeding. - INR goal of 2.5. -04/26/22 -Hold Coumadin and hold IV heparin as INR is trending up appropriately   4)Status post mechanical aortic valve replacement -Continue anticoagulation with INR goal of 2.5 (high risk of rebleeding) -Anticoagulation management as above #3   Aspiration pneumonitis -initially started on zosyn -Completed Augmentin -Continue the use of Mucinex, flutter valve and as needed bronchodilator.   Essential hypertension -Vital signs stable -Continue to follow blood pressure trend and resume antihypertensive agents as required.   Dementia without behavioral disturbance -At risk for hospital delirium -Continue constant reorientation.   Mixed hyperlipidemia -Resume the use of simvastatin at time of discharge. -Continue heart healthy diet.   Anxiety/depression -Continue BuSpar   Acute on chronic renal failure--CKD stage IV -Renal function has improved and back to baseline at this moment  (creatinine 1.39) -due to hemodynamic changes, IV contrast -Good response to Lasix  given; no crackles appreciated on examination.  Still with some signs of fluid overload but improving. -Continue to monitor daily basic metabolic panel. -Renal function has remained stable.   Thrombocytopenia -due to acute medical illness -Continue to follow platelet counts trend/stability.   Hypokalemia -In the setting of GI losses and decreased oral intake -Magnesium within normal limit -Continue to follow electrolytes trend and further replete as needed. -Stable overall.  Diarrhea -C. difficile PCR negative -Most likely triggered by the use of antibiotics -Continue supportive care. -Continue treatment with Florastor and as needed Imodium.  Dysphagia/weakness and deconditioning -CT scan not demonstrating not acute ischemic process -Most likely secondary to aspiration and vocal cord dysfunction -Continue supportive care -Continue to follow recommendations by speech therapy. -Physical therapy recommending skilled nursing facility for rehabilitation at discharge. -Planning for discharge once INR therapeutic. -Continue feeding supplements.   Goals of Care -confirmed with patient, daughter, spouse>>DNR -Continue current goals of care. -Physical therapy evaluation will be requested -Overall prognosis and future recovery guarded.      Family Communication:   Patient's husband updated at bedside 04/26/2022;  Consultants:  none   Code Status:  DNR   DVT Prophylaxis:  SCDs     Procedures: As Listed in Progress Note Above   Antibiotics: Zosyn 1/26>>1/29 Amox/clav 1/29>>04/24/22 Azithro 1/26>>1/28   Subjective: -INR trending up further rapidly -Discussed with pharmacist-hold IV heparin and Coumadin today and recheck INR in a.m. -No obvious bleeding concerns -Oral intake remains poor  Objective: Vitals:   04/25/22 2055 04/26/22 0455 04/26/22 1018 04/26/22 1339  BP: 135/61 123/62 132/80  129/81  Pulse: 89 83 95 89  Resp: 20 18 18 19   Temp: 98.6 F (37 C) 98.3 F (36.8 C) 98 F (36.7 C) 98.1 F (36.7 C)  TempSrc:   Oral Axillary  SpO2: 100% 100% 100% 100%  Weight:      Height:        Intake/Output Summary (Last 24 hours) at 04/26/2022 1821 Last data filed at 04/26/2022 0900 Gross per 24 hour  Intake 953 ml  Output 700 ml  Net 253 ml   Weight change:   Exam:  Physical Exam  Gen:- Awake Alert, in no acute distress , frail and chronically ill-appearing HEENT:- [place].AT, No sclera icterus Neck-Supple Neck,No JVD,.  Lungs-  CTAB , fair air movement bilaterally  CV- S1, S2 normal, RRR, metalic click  Abd-  +ve B.Sounds, Abd Soft, No tenderness,    Extremity/Skin:- +1   edema,   good pedal pulses  Psych-affect is flat, oriented x3 Neuro-no new focal deficits, no tremors  Data Reviewed: I have personally reviewed following labs and imaging studies  Basic Metabolic Panel: Recent Labs  Lab 04/20/22 0421 04/22/22 0509 04/25/22 0433  NA 136 138 139  K 3.6 3.8 3.5  CL 102 106 105  CO2 25 26 27   GLUCOSE 116* 103* 106*  BUN 36* 33* 37*  CREATININE 1.39* 1.16* 1.16*  CALCIUM 8.4* 8.4* 8.4*   Coagulation Profile: Recent Labs  Lab 04/24/22 0442 04/25/22 0433 04/26/22 0440  INR 1.7* 2.1* 3.1*   CBC: Recent Labs  Lab 04/22/22 0509 04/23/22 0444 04/24/22 0442 04/25/22 0433 04/26/22 0440  WBC 8.5 6.8 5.5 5.3 5.7  HGB 8.7* 8.6* 8.6* 8.3* 8.9*  HCT 28.2* 28.2* 28.3* 27.5* 30.1*  MCV 103.7* 104.8* 104.8* 105.0* 106.4*  PLT 114* 114* 124* 139* 151   Urine analysis:    Component Value Date/Time   COLORURINE YELLOW 04/14/2022 [place] 04/14/2022  2200   LABSPEC >1.046 (H) 04/14/2022 2200   PHURINE 5.0 04/14/2022 2200   GLUCOSEU NEGATIVE 04/14/2022 2200   HGBUR NEGATIVE 04/14/2022 2200   BILIRUBINUR NEGATIVE 04/14/2022 2200   KETONESUR NEGATIVE 04/14/2022 2200   PROTEINUR NEGATIVE 04/14/2022 2200   UROBILINOGEN 2.0 (H) 11/01/2007  1518   NITRITE NEGATIVE 04/14/2022 2200   LEUKOCYTESUR NEGATIVE 04/14/2022 2200   Sepsis Labs:  Recent Results (from the past 240 hour(s))  C Difficile Quick Screen (NO PCR Reflex)     Status: None   Collection Time: 04/18/22  4:52 AM   Specimen: STOOL  Result Value Ref Range Status   C Diff antigen NEGATIVE NEGATIVE Final   C Diff toxin NEGATIVE NEGATIVE Final   C Diff interpretation No C. difficile detected.  Final    Comment: Performed at [facility], [address] [place], [place] [address]    Scheduled Meds:  buPROPion  150 mg Oral q AM   busPIRone  10 mg Oral BID   Chlorhexidine Gluconate Cloth  6 each Topical Daily   mouth rinse  15 mL Mouth Rinse 4 times per day   pantoprazole (PROTONIX) IV  40 mg Intravenous Q12H   saccharomyces boulardii  250 mg Oral BID   simvastatin  40 mg Oral q1800   sodium bicarbonate  650 mg Oral TID   sodium chloride flush  10-40 mL Intracatheter Q12H   Warfarin - Pharmacist Dosing Inpatient   Does not apply q1600   Continuous Infusions:   Procedures/Studies: DG Swallowing Func-Speech Pathology  Result Date: 04/25/2022 Table formatting from the original result was not included. Modified Barium Swallow Study Patient Details Name: ARIYAH SEDLACK MRN: [id number] Date of Birth: 1940/05/24 Today's Date: 04/25/2022 HPI/PMH: HPI: 82 year old female with a history of dementia, hypertension, hyperlipidemia, CKD stage III, anxiety, mechanical aortic valve, stroke, diastolic CHF, and anxiety presenting from [facility] with generalized weakness for 3 to 4 days.  She has had associated decreased oral intake and nausea.  The patient is a difficult historian.  The patient's spouse is at the bedside.  He is also a difficult historian.  Nevertheless, it appears that the patient was treated for pneumonia.  Review of the [facility] from [place] shows the patient was started on levofloxacin on 04/08/2022 for 5-day course.  Even with the antibiotics, the patient continued to have  progressive generalized weakness.  She denies any headache, neck pain, chest pain, hemoptysis, vomiting, diarrhea.  She does complain of abdominal pain.  She states that the abdominal pain has been present for 3 to 4 days.  There is no hematochezia or melena.  She has some dyspnea with exertion.  In the ED, the patient was afebrile hemodynamically stable with oxygen saturation 99% on room air.  WBC 10.8, hemoglobin 9.3, platelets 302,000.  Sodium 135, potassium 4.4, bicarbonate 24, serum creatinine 1.77.  The patient was started on IV ceftriaxone and azithromycin and IV fluids.  She was admitted for further evaluation and treatment. On 04/20/22 Acute respiratory distress while eating breakfast experiencing choking event.  Transient use of nonrebreather and subsequently 5 L nasal cannula supplementation. BSE requested. Clinical Impression: Clinical Impression: Pt presents with mild oral dysphagia and mild/moderate pharyngeal dysphagia characterized by mildly decreased oral coordination, decreased epiglottic deflection, decreased laryngeal vestibule closure resulting in trace to min amonts of aspiration of thin liquids. Aspiration does not occur on every presentation of thin liquids, however pt is unable to complete compensatory strategies to minimize/eliminate aspiration and cough is not effective in  clearing aspirates. Pt continues to be dependent on a feeder for all PO increasing her risk of aspiration. Regular, puree and NTL were noted to be [facility] with improved epiglottic deflection noted with heavier bolus presentations.  Pt continues to be very weak and lethargic negatively impacting the rate and ability to fully masticate solids. Recommend continue with current diet of D1/puree and NTL. ST will continue to follow acutely for ongoing dysphagia therapy. Pt is a good candidate for free water protocol. Factors that may increase risk of adverse event in presence of aspiration ([place] 2021): Limited mobility;  Frail or deconditioned; Dependence for feeding and/or oral hygiene; Weak cough Recommendations/Plan: Swallowing Evaluation Recommendations Swallowing Evaluation Recommendations Recommendations: PO diet PO Diet Recommendation: Dysphagia 1 (Pureed); Mildly thick liquids (Level 2, nectar thick) Liquid Administration via: Cup; Straw Medication Administration: Whole meds with puree Supervision: Full assist for feeding Swallowing strategies  : Slow rate; Small bites/sips Postural changes: Position pt fully upright for meals Oral care recommendations: Oral care BID (2x/day); Oral care before ice chips/water Caregiver Recommendations: Avoid jello, ice cream, thin soups, popsicles; Remove water pitcher Treatment Plan Treatment Plan Treatment recommendations: Therapy as outlined in treatment plan below Follow-up recommendations: Skilled nursing-short term rehab (<3 hours/day) Functional status assessment: Patient has had a recent decline in their functional status and demonstrates the ability to make significant improvements in function in a reasonable and predictable amount of time. Treatment frequency: Min 2x/week Treatment duration: 2 weeks Interventions: Aspiration precaution training; Compensatory techniques; Patient/family education; Trials of upgraded texture/liquids; Diet toleration management by SLP; Respiratory muscle strength training; Oropharyngeal exercises Recommendations Recommendations for follow up therapy are one component of a multi-disciplinary discharge planning process, led by the attending physician.  Recommendations may be updated based on patient status, additional functional criteria and insurance authorization. Assessment: Orofacial Exam: Orofacial Exam Oral Cavity - Dentition: Adequate natural dentition Anatomy: Anatomy: WFL Thin Liquids: Thin Liquids (Level 0) Thin Liquids : Impaired Bolus delivery method: Cup; Straw; Spoon Thin Liquid - Impairment: Oral Impairment; Pharyngeal impairment Lip  Closure: Not impaired Tongue control during bolus hold: Escape to lateral buccal cavity/floor of mouth Bolus transport/lingual motion: Brisk tongue motion Oral residue: Trace residue lining oral structures Location of oral residue : Tongue Initiation of swallow : Posterior angle of the ramus Soft palate elevation: Complete Tongue base retraction: Trace column of contrast or air between tongue base and PPW Laryngeal elevation: Complete superior movement of thyroid cartilage with complete approximation of arytenoids to epiglottic petiole Anterior hyoid excursion: Complete Epiglottic movement: Partial Laryngeal vestibule closure: Incomplete Pharyngeal stripping wave : Present - diminished Pharyngeal contraction (A/P view only): N/A Pharyngoesophageal segment opening: Partial distention/partial duration, partial obstruction of flow Pharyngeal residue: Trace residue within or on pharyngeal structures Location of pharyngeal residue: Valleculae; Pharyngeal wall; Pyriform sinuses; Tongue base Penetration/Aspiration Scale (PAS) score: 7.  Material enters airway, passes BELOW cords and not ejected out despite cough attempt by patient  Mildly Thick Liquids: Mildly thick liquids (Level 2, nectar thick) Mildly thick liquids (Level 2, nectar thick): WFL  Moderately Thick Liquids: Moderately thick liquids (Level 3, honey thick) Moderately thick liquids (Level 3, honey thick): WFL  Puree: Puree Puree: WFL Solid: Solid Solid: WFL Pill: No data recorded Compensatory Strategies: Compensatory Strategies Compensatory strategies: No   General Information: Caregiver present: No  Diet Prior to this Study: Dysphagia 1 (pureed); Mildly thick liquids (Level 2, nectar thick)   Temperature : Normal   No data recorded  Supplemental O2: Nasal cannula  History of Recent Intubation: No  Behavior/Cognition: Alert; Cooperative; Pleasant mood Self-Feeding Abilities: Dependent for feeding Baseline vocal quality/speech: Hypophonia/low volume Volitional  Cough: Able to elicit Volitional Swallow: Able to elicit No data recorded Goal Planning: Prognosis for improved oropharyngeal function: Good Barriers to Reach Goals: Severity of deficits; Overall medical prognosis No data recorded Patient/Family Stated Goal: N/A Consulted and agree with results and recommendations: Patient; Nurse Pain: Pain Assessment Pain Assessment: Faces Faces Pain Scale: 4 Breathing: 0 Negative Vocalization: 0 Facial Expression: 0 Body Language: 0 Consolability: 0 PAINAD Score: 0 Facial Expression: 0 Body Movements: 1 Muscle Tension: 0 Compliance with ventilator (intubated pts.): N/A Vocalization (extubated pts.): 1 CPOT Total: 2 Pain Location: BLE with movement Pain Descriptors / Indicators: Grimacing; Guarding; Sore Pain Intervention(s): Limited activity within patient's tolerance; Monitored during session; Repositioned End of Session: Start Time:SLP Start Time (ACUTE ONLY): 1537 Stop Time: SLP Stop Time (ACUTE ONLY): 1620 Time Calculation:SLP Time Calculation (min) (ACUTE ONLY): 43 min Charges: SLP Evaluations $ SLP Speech Visit: 1 Visit SLP Evaluations $BSS Swallow: 1 Procedure $MBS Swallow: 1 Procedure $Swallowing Treatment: 1 Procedure SLP visit diagnosis: SLP Visit Diagnosis: Dysphagia, unspecified (R13.10) Past Medical History: Past Medical History: Diagnosis Date  Anemia in CKD (chronic kidney disease)   Anxiety   Dementia with behavioral disturbance (HCC)   Diabetes mellitus with neuropathy (HCC)   Diabetic neuropathy (HCC)   DJD (degenerative joint disease)   GERD (gastroesophageal reflux disease)   H/O aortic valve replacement   Hyperlipidemia   Hypertension   Neuromuscular disorder (HCC)   neuropathy in feet  Obesity   Sleep apnea   Stroke ([place])   " light stroke"  Vitamin D deficiency   Wears glasses  Past Surgical History: Past Surgical History: Procedure Laterality Date  ANKLE SURGERY    Left tendon repair  ANTERIOR APPROACH HEMI HIP ARTHROPLASTY Left 08/13/2020  Procedure:  ANTERIOR APPROACH HEMI HIP ARTHROPLASTY;  Surgeon: Leandrew Koyanagi, MD;  Location: [place];  Service: Orthopedics;  Laterality: Left;  AORTIC VALVE REPLACEMENT  03/1994  CARDIAC CATHETERIZATION    1996 [facility]  CERVICAL SPINE SURGERY    COLONOSCOPY    HEMORRHOID SURGERY    IR PARACENTESIS  10/31/2019  KNEE ARTHROSCOPY Right 04/13/2015  Procedure: RIGHT KNEE ARTHROSCOPY WITH DEBRIDEMENT AND PARTIAL MEDIAL MENISCECTOMY;  Surgeon: Mcarthur Rossetti, MD;  Location: [place];  Service: Orthopedics;  Laterality: Right;  KNEE ARTHROSCOPY W/ MENISCECTOMY Right 04/13/2015  LUMBAR FUSION    RIGHT HEART CATH N/A 10/28/2019  Procedure: RIGHT HEART CATH;  Surgeon: Leonie Man, MD;  Location: [place] [facility];  Service: Cardiovascular;  Laterality: N/A; Amelia H. Roddie Mc, CCC-SLP Speech Language Pathologist Wende Bushy 04/25/2022, 6:08 PM  CT HEAD WO CONTRAST (5MM)  Result Date: 04/21/2022 CLINICAL DATA:  Neuro deficit.  Stroke suspected. EXAM: CT HEAD WITHOUT CONTRAST TECHNIQUE: Contiguous axial images were obtained from the base of the skull through the vertex without intravenous contrast. RADIATION DOSE REDUCTION: This exam was performed according to the departmental dose-optimization program which includes automated exposure control, adjustment of the mA and/or kV according to patient size and/or use of iterative reconstruction technique. COMPARISON:  CT Brain 09/23/21 FINDINGS: Brain: No evidence of acute infarction, hemorrhage, hydrocephalus, extra-axial collection or mass lesion/mass effect. Sequela of moderate to severe chronic microvascular ischemic change. Chronic small right cerebellar infarct. Vascular: No hyperdense vessel or unexpected calcification. Skull: Normal. Negative for fracture or focal lesion. Sinuses/Orbits: Trace right mastoid effusion. No middle ear effusion. There  is impacted cerumen in the left EAC. Right lens replacement. Redemonstrated mucosal thickening in the right maxillary sinus  and dense inspissated secretions or chronic fungal colonization. Other: None IMPRESSION: 1. No hemorrhage or CT evidence of an acute cortical infarct. 2. Sequela of moderate to severe chronic microvascular ischemic change. Chronic small right cerebellar infarct. Electronically Signed   By: Marin Roberts M.D.   On: 04/21/2022 15:27   DG CHEST PORT 1 VIEW  Result Date: 04/20/2022 CLINICAL DATA:  Aspiration. EXAM: PORTABLE CHEST 1 VIEW COMPARISON:  04/14/2022 FINDINGS: Right arm PICC line tip terminates in the distal SVC. Previous median sternotomy. Stable mild cardiac enlargement. Persistent small bilateral pleural effusions, right greater than left. New bilateral lower lobe zone peribronchovascular opacities are identified along with scattered areas of subsegmental atelectasis. IMPRESSION: 1. New bilateral lower lobe peribronchovascular opacities compatible with aspiration. 2. Persistent small bilateral pleural effusions, right greater than left. Electronically Signed   By: Kerby Moors M.D.   On: 04/20/2022 09:44   [place] EKG SITE RITE  Result Date: 04/15/2022 If Site Rite image not attached, placement could not be confirmed due to current cardiac rhythm.  CT Angio Abd/Pel w/ and/or w/o  Result Date: 04/14/2022 CLINICAL DATA:  Retroperitoneal bleed suspected, extraperitoneal bleed. EXAM: CTA ABDOMEN AND PELVIS WITHOUT AND WITH CONTRAST TECHNIQUE: Multidetector CT imaging of the abdomen and pelvis was performed using the standard protocol during bolus administration of intravenous contrast. Multiplanar reconstructed images and MIPs were obtained and reviewed to evaluate the vascular anatomy. RADIATION DOSE REDUCTION: This exam was performed according to the departmental dose-optimization program which includes automated exposure control, adjustment of the mA and/or kV according to patient size and/or use of iterative reconstruction technique. CONTRAST:  29mL OMNIPAQUE IOHEXOL 350 MG/ML SOLN COMPARISON:   Noncontrast CT earlier today. FINDINGS: VASCULAR Aorta: Moderate atherosclerosis. No aneurysm, dissection or acute findings. Celiac: Calcified plaque at the origin causes mild stenosis. Distal branch vessels are patent. No aneurysm, dissection or acute findings. SMA: Mild diffuse atheromatous plaque with mild areas of stenosis. No aneurysm or acute findings. Renals: Mild plaque in the proximal renal arteries causing varying degrees of stenosis up to moderate. No aneurysm or acute findings. IMA: Patent. Inflow: Mild atherosclerosis without acute findings. Proximal Outflow: Bilateral common femoral and visualized portions of the superficial and profunda femoral arteries are patent without evidence of aneurysm, dissection, vasculitis or significant stenosis. Retroperitoneal and extraperitoneal hematomas. There is no active arterial extravasation within any of the retroperitoneal or extraperitoneal bleed. Veins: Venous phase imaging demonstrates patency of the iliac veins and IVC. Portal, splenic and mesenteric veins are patent. No areas of active extravasation or seen on venous phase. Review of the MIP images confirms the above findings. NON-VASCULAR Lower chest: Right pleural effusion with pleural enhancement, unchanged from earlier today. Hepatobiliary: Nodular hepatic contours typical of cirrhosis. Water density there is a 13 mm low-density lesion in the left lobe of the liver series 12, image 21, measuring slightly higher than simple fluid density, nonspecific. Multiple gallstones without evidence of gallbladder inflammation. Pancreas: Parenchymal atrophy. No ductal dilatation or inflammation. Spleen: No splenomegaly. Adrenals/Urinary Tract: No adrenal nodule or hemorrhage. Bilateral renal parenchymal atrophy with multifocal areas of parenchymal scarring. Parenchymal calcifications in the upper pole the right kidney. No hydronephrosis. Displaced to the right due to left pelvic fluid collections. The bladder appears  thick walled which is nonspecific. Stomach/Bowel: No bowel obstruction or inflammation. There is mild colonic distension with stool proximally and air distally. The appendix is not  seen. Lymphatic: No gross adenopathy. Reproductive: Uterine vascular calcifications again seen. Other: Again seen multiple areas of presume extraperitoneal and retroperitoneal hematoma/hemorrhage. Largest area is in the left pelvis measuring approximately 12.9 x 7 cm with hematocrit level, measured on series 12, image 78. This may be contiguous with the left rectus abdominus musculature. More superiorly is an area of presumed rectus hematoma measuring 6.1 x 5.1 cm, measured on series 12, image 67. There is stranding in the left retroperitoneum tracking anterior to the iliopsoas muscle that is primarily ill-defined. Stranding within the right extraperitoneal M with ill-defined hemorrhage. Fluid collections within the upper abdominal wall musculature just underneath the anterior ribs, 8.7 x 3.3 cm on the left series 12, image 30, 4.0 x 2.7 cm on the right series 12, image 31. There is no evidence of active extravasation within any of these hematomas. There is edema within the subcutaneous tissues of the left flank/abdominal wall as well as anterior to the lower abdominal wall musculature. Musculoskeletal: Unchanged from earlier today with L4 compression deformity. Left hip arthroplasty. IMPRESSION: 1. No evidence of active extravasation within any of the retroperitoneal or extraperitoneal fluid collections/presumed hematoma. 2. Multiple areas of presume extraperitoneal and retroperitoneal hematoma/hemorrhage, largest area in the left pelvis measuring 12.9 x 7 cm, likely contiguous with the left rectus abdominus musculature. Allowing for differences in caliper placement in technique, no significant change in size of these collections from earlier today. Possibility of infection is considered but felt less likely. 3. Cirrhosis. Indeterminate  13 mm low-density lesion in the left lobe of the liver measuring slightly higher than simple fluid density. Recommend further evaluation with MRI on a nonemergent basis after resolution of acute event. 4. Cholelithiasis without acute cholecystitis. 5. Aortic and branch atherosclerosis with areas of arterial stenosis up to moderate in degree, as described. 6. Additional chronic changes are stable from earlier today. Aortic Atherosclerosis (ICD10-I70.0). Electronically Signed   By: Keith Rake M.D.   On: 04/14/2022 16:41   CT CHEST ABDOMEN PELVIS WO CONTRAST  Result Date: 04/14/2022 CLINICAL DATA:  Generalized pain.  Sepsis.  Nausea. EXAM: CT CHEST, ABDOMEN AND PELVIS WITHOUT CONTRAST TECHNIQUE: Multidetector CT imaging of the chest, abdomen and pelvis was performed following the standard protocol without IV contrast. RADIATION DOSE REDUCTION: This exam was performed according to the departmental dose-optimization program which includes automated exposure control, adjustment of the mA and/or kV according to patient size and/or use of iterative reconstruction technique. COMPARISON:  Chest x-ray earlier 04/14/2022. Older x-rays. CT scan abdomen pelvis 2017 July FINDINGS: CT CHEST FINDINGS Cardiovascular: Heart is enlarged. Trace pericardial fluid. Prominent calcifications along the mitral valve. Postop chest with prosthetic aortic valve. The thoracic aorta has scattered calcified atherosclerotic plaque. Diameter of the ascending aorta at the level of the right pulmonary artery measures 4.3 by 4.0 cm. Mediastinum/Nodes: Small thyroid gland. No specific abnormal lymph node enlargement present in the axillary region, hila or mediastinum on this noncontrast exam. Prominent subcarinal node is noted measuring 17 by 10 mm on series 2, image 30. Normal caliber thoracic esophagus. Lungs/Pleura: Diffuse breathing motion. Small right-sided pleural effusion identified with some adjacent parenchymal opacities. Associated  pleural thickening. This effusion was seen in 2017 and with slightly larger that time. More thickening today of the pleura. Atelectasis versus infiltrate. There is some bandlike areas of opacity as well in the middle lobe. Scattered areas of interstitial septal thickening are identified. There are few patchy ground-glass areas as well in the right upper lobe towards the apex  such as series 3, image 42, series 3, image 53. Additional areas scattered in the left upper lobe, middle lobe and superior segment of left lower lobe. This has a differential including atypical or viral process. Recommend follow-up. Musculoskeletal: Osteopenia. Scattered degenerative changes noted along the spine. Of note the patient's arms were scanned at the patient's side. CT ABDOMEN PELVIS FINDINGS Hepatobiliary: Nodular contour to the liver. Please correlate for any known history of chronic liver disease. There are multiple stones in the nondilated gallbladder. Pancreas: Moderate pancreatic atrophy. No obvious mass. Appearance is similar to prior. Spleen: Spleen is grossly nonenlarged. Adrenals/Urinary Tract: Adrenal glands are grossly preserved. There is mild bilateral renal atrophy with some focal areas along the midportion left kidney laterally. Some calcifications noted along the hilum of the right kidney which very well could be vascular. No definite ureteral or renal stones otherwise. The bladder is somewhat decompressed with wall thickening but there is mass effect along the bladder from the adjacent fluid collections. Stomach/Bowel: Stomach is mildly distended with fluid. The small bowel is nondilated. Large bowel is nondilated prominent right-sided colonic stool. Again evaluation is limited by significant motion. Vascular/Lymphatic: Diffuse vascular calcifications are identified with areas of significant stenosis suggested along branch vessels including SMA, renal arteries. Normal caliber IVC. Reproductive: Scattered  calcifications along the uterus. These could be vascular. No separate adnexal mass. Other: Significant motion noted limited evaluation. Extensive areas of presumed hematoma identified. These include the rectus abdominis muscles bilaterally, left-greater-than-right. The left hematoma extends along the course of the entirety of the rectus abdominis muscle, cephalocaudal length approaching 29 cm. Transverse dimension at the level of the pelvis approximate 7.4 by 4.8 cm. Again the right-sided areas are smaller and are along the lower chest and upper abdomen regions. There is also a large extraperitoneal hematoma more on the left than on the right with mass effect along surrounding structures including the bladder. This extends along the margin of the iliacus muscle. Longitudinal length on coronal image 89 of series 5 measures a proximally 18.2 cm. Axial dimension maximally approaches 10.5 by 5.5 cm. Anasarca. Musculoskeletal: Streak artifact related to patient's left hip arthroplasty obscuring the surrounding tissues. Curvature and degenerative changes seen along the spine. Osteopenia. Multilevel stenosis identified along the lumbar spine. There is moderate compression of the superior endplate of L4 which was not seen in 2017 per the margins are somewhat sclerotic. Favor a subacute to chronic process. Please correlate with history. There is multilevel disc bulging. Degenerative changes seen along the pelvis. Critical Value/emergent results were called by telephone at the time of interpretation on 04/14/2022 at 1:59 pm to provider DAVID TAT , who verbally acknowledged these results. IMPRESSION: 1. Large areas of presumed hematoma involving the rectus abdominis muscles bilaterally, left-greater-than-right. There is also large extraperitoneal hematoma along the margin of the iliopsoas muscle on the left. This extends along the margin of the bladder with mass effect. 2. Small right-sided pleural effusion with adjacent  parenchymal opacities. This effusion was seen in 2017 and with slightly larger that time. More thickening of the pleura today. Atelectasis versus infiltrate. 3. Patchy areas of ground-glass in both lungs. This has a differential including atypical or viral process. Recommend follow-up. 4. Cardiomegaly.  Postop chest. 5. Ascending aortic aneurysm measuring 4.3 x 4.0 cm. 6. Nodular contour to the liver. Please correlate for any known history of chronic liver disease. 7. Cholelithiasis. 8. Diffuse vascular calcifications with areas of significant stenosis along branch vessels including SMA, renal arteries. 9. Moderate compression  of the superior endplate of L4, not seen in 2017 per the margins are somewhat sclerotic. Favor a subacute to chronic process. Please correlate with history. 10. Aortic atherosclerosis. Aortic Atherosclerosis (ICD10-I70.0). Electronically Signed   By: Jill Side M.D.   On: 04/14/2022 14:04   DG Chest 2 View  Result Date: 04/14/2022 CLINICAL DATA:  Fall EXAM: CHEST - 2 VIEW COMPARISON:  Chest x-ray dated September 23, 2021 FINDINGS: Cardiac and mediastinal contours are unchanged post median sternotomy. Left-greater-than-right pleural effusions. No evidence of pneumothorax. Chronic appearing anterior second right rib fracture. IMPRESSION: Left-greater-than-right pleural effusions bibasilar atelectasis, similar to prior. Electronically Signed   By: Yetta Glassman M.D.   On: 04/14/2022 10:04    Danie Hannig Denton Brick, MD  [facility]  If 7PM-7AM, please contact night-coverage [email or web address] Password [facility] 04/26/2022, 6:21 PM   LOS: 12 days

## 2022-04-26 NOTE — Progress Notes (Signed)
[place] for Heparin and Coumadin Indication: mechanical aortic valve   Allergies  Allergen Reactions   Tape Itching    Redness, Please use "paper" tape only    Patient Measurements: Height: 5\' 7"  (170.2 cm) Weight: 66.5 kg (146 lb 9.7 oz) IBW/kg (Calculated) : 61.6 HEPARIN DW (KG): 63.7   Vital Signs: Temp: 98.3 F (36.8 C) (02/07 0455) BP: 123/62 (02/07 0455) Pulse Rate: 83 (02/07 0455)  Labs: Recent Labs    04/24/22 0442 04/25/22 0433 04/25/22 1437 04/26/22 0440  HGB 8.6* 8.3*  --  8.9*  HCT 28.3* 27.5*  --  30.1*  PLT 124* 139*  --  151  LABPROT 19.4* 23.1*  --  31.3*  INR 1.7* 2.1*  --  3.1*  HEPARINUNFRC 0.34 0.18* 0.25* 0.50  CREATININE  --  1.16*  --   --      Estimated Creatinine Clearance: 35.1 mL/min (A) (by C-G formula based on SCr of 1.16 mg/dL (H)).   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 Patient found to have extraperitoneal and retroperitoneal bleed with INR reversed. Now stabilized and resumed on anticoagulation conservatively on 2/2 with heparin for mechanical AVR. MD requests heparin level goal 0.3-0.5 with no boluses. Warfarin resumed on 2/4 with goal ~2.5.   Heparin level trended up overnight to 0.5 on 1450 units/hr. INR up substantially from 2.1 to 3.1 this  morning. Hemoglobin up to 8.9, platelet count within normal limits. No bleeding or IV issues noted.   Goal of Therapy:  INR 2.5-3.5 (~2.5) Heparin level 0.3-0.5 units/ml Monitor platelets by anticoagulation protocol: Yes   Plan:  Hold Coumadin today given boost in INR overnight Continue daily INR checks while admitted Discussed with MD will stop heparin today  Erin Hearing PharmD., BCPS Clinical Pharmacist 04/26/2022 8:31 AM

## 2022-04-26 NOTE — Progress Notes (Signed)
Initial Nutrition Assessment  DOCUMENTATION CODES:   Severe malnutrition in context of acute illness/injury  INTERVENTION:  Mighty Shakes with all meals   Assist with feeding all meals  Recommend up in chair for meals if patient is able  Multivitamin daily  NUTRITION DIAGNOSIS:   Severe Malnutrition related to acute illness (SIRS) as evidenced by energy intake < or equal to 50% for > or equal to 5 days, moderate muscle depletion.   GOAL:  Patient will meet greater than or equal to 90% of their needs   MONITOR:  PO intake, Supplement acceptance, Weight trends, Labs  REASON FOR ASSESSMENT:   Rounds    ASSESSMENT: Patient 82 yo female with history of DM2, Dementia, GERD, HTN, CKD-4, CHF, anxiety. SIRS at admission, abdominal pain, acute blood loss anemia, dysphagia.  Palliative consulted. Patient is DNR.  Patient to discharge to [facility] when appropriate.   Patient requiring assist with feeding due to weakness. RD provided Mighty Shake during visit - she consumed 100% (220 kcal, 6 gr protein). She is not liking the pureed foods. Nursing reports patient took only a couple of bites of tray. 2/2-2/6 meal intake ranging 0-50% except 1 x 75%.   Patient intake </=50% for >/= 5 days. Moderate muscle loss (acute).   Medications reviewed and include: protonix, florastor, sodium bicarbonate.       Latest Ref Rng & Units 04/25/2022    4:33 AM 04/22/2022    5:09 AM 04/20/2022    4:21 AM  BMP  Glucose 70 - 99 mg/dL 106  103  116   BUN 8 - 23 mg/dL 37  33  36   Creatinine 0.44 - 1.00 mg/dL 1.16  1.16  1.39   Sodium 135 - 145 mmol/L 139  138  136   Potassium 3.5 - 5.1 mmol/L 3.5  3.8  3.6   Chloride 98 - 111 mmol/L 105  106  102   CO2 22 - 32 mmol/L 27  26  25    Calcium 8.9 - 10.3 mg/dL 8.4  8.4  8.4       NUTRITION - FOCUSED PHYSICAL EXAM:  Flowsheet Row Most Recent Value  Orbital Region Mild depletion  Upper Arm Region No depletion  Thoracic and Lumbar Region No depletion   Buccal Region Mild depletion  Temple Region Mild depletion  Clavicle Bone Region Moderate depletion  Clavicle and Acromion Bone Region Moderate depletion  Dorsal Hand Mild depletion  Patellar Region Mild depletion  Anterior Thigh Region Moderate depletion  Edema (RD Assessment) Mild  Hair Reviewed  Eyes Reviewed  Mouth Reviewed  Skin Reviewed  Nails Reviewed      Diet Order:   Diet Order             DIET - DYS 1 Room service appropriate? Yes; Fluid consistency: Nectar Thick  Diet effective now                   EDUCATION NEEDS:  Education needs have been addressed  Skin:  Skin Assessment: Skin Integrity Issues: Skin Integrity Issues:: Stage II Stage II: sacrum and skin tear to right elbow  Last BM:  2/5  Height:   Ht Readings from Last 1 Encounters:  04/14/22 5\' 7"  (1.702 m)    Weight:   Wt Readings from Last 1 Encounters:  04/17/22 66.5 kg    Ideal Body Weight:   61 kg  BMI:  Body mass index is 22.96 kg/m.  Estimated Nutritional Needs:   Kcal:  1650-1800  Protein:  85-90 gr  Fluid:  >1500 ml daily  Colman Cater MS,RD,CSG,LDN Contact: Shea Evans

## 2022-04-26 NOTE — TOC Progression Note (Signed)
Transition of Care [facility]) - Progression Note    Patient Details  Name: ELWYN LOWDEN MRN: [id number] Date of Birth: 01-May-1940  [facility] CM/SW Contact  Salome Arnt, [place] Phone Number: 04/26/2022, 10:35 AM  Clinical Narrative:  SNF authorization approved through 2/10 at 11:59 PM.  [facility]-R updated. TOC will continue to follow.     Expected Discharge Plan: [place] Barriers to Discharge: Continued Medical Work up  Expected Discharge Plan and Services In-house Referral: Clinical Social Work Discharge Planning Services: CM Consult Post Acute Care Choice: [place] Living arrangements for the past 2 months: [place]                                       Social Determinants of Health (SDOH) Interventions SDOH Screenings   Food Insecurity: No Food Insecurity (04/15/2022)  Housing: Low Risk  (04/15/2022)  Transportation Needs: No Transportation Needs (04/15/2022)  Utilities: Not At Risk (04/15/2022)  Tobacco Use: Low Risk  (04/14/2022)    Readmission Risk Interventions    04/16/2022   12:03 PM 12/19/2020    2:33 PM  Readmission Risk Prevention Plan  Transportation Screening Complete Complete  Medication Review Press photographer) Complete Complete  HRI or Home Care Consult Complete Complete  SW Recovery Care/Counseling Consult Complete Complete  Palliative Care Screening Not Applicable Not Applicable  Skilled Nursing Facility Complete Complete

## 2022-04-27 DIAGNOSIS — Z515 Encounter for palliative care: Secondary | ICD-10-CM | POA: Diagnosis not present

## 2022-04-27 DIAGNOSIS — J690 Pneumonitis due to inhalation of food and vomit: Secondary | ICD-10-CM | POA: Diagnosis not present

## 2022-04-27 DIAGNOSIS — R791 Abnormal coagulation profile: Secondary | ICD-10-CM | POA: Diagnosis not present

## 2022-04-27 DIAGNOSIS — A419 Sepsis, unspecified organism: Secondary | ICD-10-CM | POA: Diagnosis not present

## 2022-04-27 LAB — RENAL FUNCTION PANEL
Albumin: 2.6 g/dL — ABNORMAL LOW (ref 3.5–5.0)
Anion gap: 10 (ref 5–15)
BUN: 31 mg/dL — ABNORMAL HIGH (ref 8–23)
CO2: 27 mmol/L (ref 22–32)
Calcium: 8.6 mg/dL — ABNORMAL LOW (ref 8.9–10.3)
Chloride: 101 mmol/L (ref 98–111)
Creatinine, Ser: 1.04 mg/dL — ABNORMAL HIGH (ref 0.44–1.00)
GFR, Estimated: 53 mL/min — ABNORMAL LOW (ref >60)
Glucose, Bld: 115 mg/dL — ABNORMAL HIGH (ref 70–99)
Phosphorus: 3 mg/dL (ref 2.5–4.6)
Potassium: 3.1 mmol/L — ABNORMAL LOW (ref 3.5–5.1)
Sodium: 138 mmol/L (ref 135–145)

## 2022-04-27 LAB — PROTIME-INR
INR: 3.7 — ABNORMAL HIGH (ref 0.8–1.2)
Prothrombin Time: 36.6 seconds — ABNORMAL HIGH (ref 11.4–15.2)

## 2022-04-27 LAB — CBC
HCT: 28.2 % — ABNORMAL LOW (ref 36.0–46.0)
Hemoglobin: 8.8 g/dL — ABNORMAL LOW (ref 12.0–15.0)
MCH: 32.5 pg (ref 26.0–34.0)
MCHC: 31.2 g/dL (ref 30.0–36.0)
MCV: 104.1 fL — ABNORMAL HIGH (ref 80.0–100.0)
Platelets: 160 10*3/uL (ref 150–400)
RBC: 2.71 MIL/uL — ABNORMAL LOW (ref 3.87–5.11)
RDW: 19.2 % — ABNORMAL HIGH (ref 11.5–15.5)
WBC: 6.1 10*3/uL (ref 4.0–10.5)
nRBC: 0 % (ref 0.0–0.2)

## 2022-04-27 MED ORDER — POTASSIUM CHLORIDE CRYS ER 20 MEQ PO TBCR
40.0000 meq | EXTENDED_RELEASE_TABLET | ORAL | Status: AC
  Administered 2022-04-27 (×2): 40 meq via ORAL
  Filled 2022-04-27 (×2): qty 2

## 2022-04-27 NOTE — Progress Notes (Signed)
PROGRESS NOTE  Mary [name] TTS:[id number] DOB: 1940-03-26 DOA: 04/14/2022 PCP: Pcp, No  Brief History:  82 year old female with a history of dementia, hypertension, hyperlipidemia, CKD stage III, anxiety, mechanical aortic valve, stroke, diastolic CHF, and anxiety presenting from [facility] with generalized weakness for 3 to 4 days.  She has had associated decreased oral intake and nausea.  The patient is a difficult historian.  The patient's spouse is at the bedside.  He is also a difficult historian.  Nevertheless, it appears that the patient was treated for pneumonia.  Review of the [facility] from [place] shows the patient was started on levofloxacin on 04/08/2022 for 5-day course.  Even with the antibiotics, the patient continued to have progressive generalized weakness.  She denies any headache, neck pain, chest pain, hemoptysis, vomiting, diarrhea.  She does complain of abdominal pain.  She states that the abdominal pain has been present for 3 to 4 days.  There is no hematochezia or melena.  She has some dyspnea with exertion. In the ED, the patient was afebrile hemodynamically stable with oxygen saturation 99% on room air.  WBC 10.8, hemoglobin 9.3, platelets 302,000.  Sodium 135, potassium 4.4, bicarbonate 24, serum creatinine 1.77.  The patient was started on IV ceftriaxone and azithromycin and IV fluids.  She was admitted for further evaluation and treatment.   Assessment/Plan: 1)SIRS- due to extensive intra-abdominal hemorrhage -CT abd--- Large areas of presumed hematoma involving the rectus abdominis muscles bilaterally, left-greater-than-right. There is also large extraperitoneal hematoma along the margin of the iliopsoas muscle on the left. -presented with leukocytosis, tachycardia, elevated lactate -Blood cultures NGTD -UA neg for pyuria -Treated with IV Zosyn and subsequently transition to Augmentin on 04/18/2022; patient has completed antibiotic therapy at this moment. -    2)Abdominal pain/extraperitoneal/retroperitoneal bleed/ Acute Blood Loss Anemia -CT abdomen and pelvis--- Large areas of presumed hematoma involving the rectus abdominismuscles bilaterally, left-greater-than-right. There is also largeextraperitoneal hematoma along the margin of the iliopsoas muscle on the left -Status post 8 units PRBC transfused throughout hospitalization. -Patient has also received fresh frozen plasma transfusion. -Continue fentanyl prn pain -Continue to follow hemoglobin trend. -Latest hemoglobin has remained stable >> 8 -Continue to follow trend. -Continue to follow hemoglobin trend and Coumadin level.   3)Supratherapeutic INR -Likely secondary to the patient's concomitant use of levofloxacin and decreased oral intake in the setting of warfarin use -Vitamin K 20 mg total given for the admission -9 units FFP given  total (last one ordered 04/18/22) -currently subtherapeutic and stable. -Heparin drip started. -Hemoglobin stable; no signs of overt bleeding. - INR goal of 2.5. -04/27/22 -INR continues to trend up -Continue to hold Coumadin probably through 04/28/2022 -- No bleeding concerns at this time   4)Status post mechanical aortic valve replacement -Continue anticoagulation with INR goal of 2.5 (high risk of rebleeding) -Anticoagulation management as above #3   5)Disposition--- physical therapy eval appreciated recommends SNF rehab -If patient does Not do well with acute rehab at SNF may have to transition to hospice especially if oral intake remains poor -SNF facility unable to accept patient until 04/28/2022 as per TOC  Aspiration pneumonitis -initially started on zosyn -Completed Augmentin -Continue the use of Mucinex, flutter valve and as needed bronchodilator.   Essential hypertension -Vital signs stable -Continue to follow blood pressure trend and resume antihypertensive agents as required.   Dementia without behavioral disturbance -At risk for  hospital delirium -Continue constant reorientation.   Mixed hyperlipidemia -  Resume the use of simvastatin at time of discharge. -Continue heart healthy diet.   Anxiety/depression -Continue BuSpar   Acute on chronic renal failure--CKD stage IV -Renal function has improved and back to baseline at this moment (creatinine 1.39) -due to hemodynamic changes, IV contrast -Good response to Lasix given; no crackles appreciated on examination.  Still with some signs of fluid overload but improving. -Continue to monitor daily basic metabolic panel. -Renal function has remained stable.   Thrombocytopenia -due to acute medical illness -Continue to follow platelet counts trend/stability.   Hypokalemia -In the setting of GI losses and decreased oral intake -Magnesium within normal limit -Continue to follow electrolytes trend and further replete as needed. -Stable overall.  Diarrhea -C. difficile PCR negative -Most likely triggered by the use of antibiotics -Continue supportive care. -Continue treatment with Florastor and as needed Imodium.  Dysphagia/weakness and deconditioning -CT scan not demonstrating not acute ischemic process -Most likely secondary to aspiration and vocal cord dysfunction -Continue supportive care -Continue to follow recommendations by speech therapy. -Physical therapy recommending skilled nursing facility for rehabilitation at discharge. -Planning for discharge once INR therapeutic. -Continue feeding supplements.   Goals of Care -confirmed with patient, daughter, spouse>>DNR -Overall prognosis and future recovery is poor due to poor oral intake and comorbidities      Family Communication:   Patient's husband updated at bedside 04/27/2022;  Consultants:  none   Code Status:  DNR   DVT Prophylaxis:  SCDs     Procedures: As Listed in Progress Note Above   Antibiotics: Zosyn 1/26>>1/29 Amox/clav 1/29>>04/24/22 Azithro 1/26>>1/28   Subjective: -INR still   trending up -Discussed with pharmacist- -continue to hold  Coumadin today and recheck INR in a.m. -No obvious bleeding concerns -Oral intake remains very very  poor Husband at bedside  Objective: Vitals:   04/26/22 2120 04/27/22 0358 04/27/22 1000 04/27/22 1336  BP: 124/72 129/75 129/68 122/70  Pulse: 92 92 96 86  Resp: 20 20 20    Temp: 98.7 F (37.1 C) 98.8 F (37.1 C) 98.1 F (36.7 C) 97.8 F (36.6 C)  TempSrc:      SpO2: 96% 100% 100% 99%  Weight:      Height:        Intake/Output Summary (Last 24 hours) at 04/27/2022 1522 Last data filed at 04/27/2022 1121 Gross per 24 hour  Intake 370 ml  Output --  Net 370 ml   Weight change:   Exam:  Physical Exam  Gen:- Awake Alert, in no acute distress , frail and chronically ill-appearing HEENT:- [place].AT, No sclera icterus Neck-Supple Neck,No JVD,.  Lungs-  CTAB , fair air movement bilaterally  CV- S1, S2 normal, RRR, metalic click  Abd-  +ve B.Sounds, Abd Soft, No tenderness,    Extremity/Skin:- +1   edema,   good pedal pulses  Psych-affect is flat, oriented x3 Neuro--Generalized weakness, -no new focal deficits, no tremors  Data Reviewed: I have personally reviewed following labs and imaging studies  Basic Metabolic Panel: Recent Labs  Lab 04/22/22 0509 04/25/22 0433 04/27/22 0513  NA 138 139 138  K 3.8 3.5 3.1*  CL 106 105 101  CO2 26 27 27   GLUCOSE 103* 106* 115*  BUN 33* 37* 31*  CREATININE 1.16* 1.16* 1.04*  CALCIUM 8.4* 8.4* 8.6*  PHOS  --   --  3.0   Coagulation Profile: Recent Labs  Lab 04/24/22 0442 04/25/22 0433 04/26/22 0440 04/27/22 0513  INR 1.7* 2.1* 3.1* 3.7*   CBC: Recent Labs  Lab 04/23/22 0444 04/24/22 0442 04/25/22 0433 04/26/22 0440 04/27/22 0513  WBC 6.8 5.5 5.3 5.7 6.1  HGB 8.6* 8.6* 8.3* 8.9* 8.8*  HCT 28.2* 28.3* 27.5* 30.1* 28.2*  MCV 104.8* 104.8* 105.0* 106.4* 104.1*  PLT 114* 124* 139* 151 160   Urine analysis:    Component Value Date/Time   COLORURINE YELLOW  04/14/2022 2200   APPEARANCEUR CLEAR 04/14/2022 2200   LABSPEC >1.046 (H) 04/14/2022 2200   PHURINE 5.0 04/14/2022 2200   GLUCOSEU NEGATIVE 04/14/2022 2200   HGBUR NEGATIVE 04/14/2022 2200   BILIRUBINUR NEGATIVE 04/14/2022 2200   KETONESUR NEGATIVE 04/14/2022 2200   PROTEINUR NEGATIVE 04/14/2022 2200   UROBILINOGEN 2.0 (H) 11/01/2007 1518   NITRITE NEGATIVE 04/14/2022 2200   LEUKOCYTESUR NEGATIVE 04/14/2022 2200   Sepsis Labs:  Recent Results (from the past 240 hour(s))  C Difficile Quick Screen (NO PCR Reflex)     Status: None   Collection Time: 04/18/22  4:52 AM   Specimen: STOOL  Result Value Ref Range Status   C Diff antigen NEGATIVE NEGATIVE Final   C Diff toxin NEGATIVE NEGATIVE Final   C Diff interpretation No C. difficile detected.  Final    Comment: Performed at [facility], [address] [place],  [address]    Scheduled Meds:  buPROPion  150 mg Oral q AM   busPIRone  10 mg Oral BID   Chlorhexidine Gluconate Cloth  6 each Topical Daily   mouth rinse  15 mL Mouth Rinse 4 times per day   pantoprazole (PROTONIX) IV  40 mg Intravenous Q12H   potassium chloride  40 mEq Oral Q3H   saccharomyces boulardii  250 mg Oral BID   simvastatin  40 mg Oral q1800   sodium bicarbonate  650 mg Oral TID   sodium chloride flush  10-40 mL Intracatheter Q12H   Warfarin - Pharmacist Dosing Inpatient   Does not apply q1600   Continuous Infusions:   Procedures/Studies: DG Swallowing Func-Speech Pathology  Result Date: 04/25/2022 Table formatting from the original result was not included. Modified Barium Swallow Study Patient Details Name: GIUSEPPINA QUINONES MRN: [id number] Date of Birth: Jul 27, 1940 Today's Date: 04/25/2022 HPI/PMH: HPI: 82 year old female with a history of dementia, hypertension, hyperlipidemia, CKD stage III, anxiety, mechanical aortic valve, stroke, diastolic CHF, and anxiety presenting from [facility] with generalized weakness for 3 to 4 days.  She has had associated  decreased oral intake and nausea.  The patient is a difficult historian.  The patient's spouse is at the bedside.  He is also a difficult historian.  Nevertheless, it appears that the patient was treated for pneumonia.  Review of the [facility] from [place] shows the patient was started on levofloxacin on 04/08/2022 for 5-day course.  Even with the antibiotics, the patient continued to have progressive generalized weakness.  She denies any headache, neck pain, chest pain, hemoptysis, vomiting, diarrhea.  She does complain of abdominal pain.  She states that the abdominal pain has been present for 3 to 4 days.  There is no hematochezia or melena.  She has some dyspnea with exertion.  In the ED, the patient was afebrile hemodynamically stable with oxygen saturation 99% on room air.  WBC 10.8, hemoglobin 9.3, platelets 302,000.  Sodium 135, potassium 4.4, bicarbonate 24, serum creatinine 1.77.  The patient was started on IV ceftriaxone and azithromycin and IV fluids.  She was admitted for further evaluation and treatment. On 04/20/22 Acute respiratory distress while eating breakfast experiencing choking event.  Transient use  of nonrebreather and subsequently 5 L nasal cannula supplementation. BSE requested. Clinical Impression: Clinical Impression: Pt presents with mild oral dysphagia and mild/moderate pharyngeal dysphagia characterized by mildly decreased oral coordination, decreased epiglottic deflection, decreased laryngeal vestibule closure resulting in trace to min amonts of aspiration of thin liquids. Aspiration does not occur on every presentation of thin liquids, however pt is unable to complete compensatory strategies to minimize/eliminate aspiration and cough is not effective in clearing aspirates. Pt continues to be dependent on a feeder for all PO increasing her risk of aspiration. Regular, puree and NTL were noted to be [facility] with improved epiglottic deflection noted with heavier bolus presentations.  Pt continues  to be very weak and lethargic negatively impacting the rate and ability to fully masticate solids. Recommend continue with current diet of D1/puree and NTL. ST will continue to follow acutely for ongoing dysphagia therapy. Pt is a good candidate for free water protocol. Factors that may increase risk of adverse event in presence of aspiration ([place] 2021): Limited mobility; Frail or deconditioned; Dependence for feeding and/or oral hygiene; Weak cough Recommendations/Plan: Swallowing Evaluation Recommendations Swallowing Evaluation Recommendations Recommendations: PO diet PO Diet Recommendation: Dysphagia 1 (Pureed); Mildly thick liquids (Level 2, nectar thick) Liquid Administration via: Cup; Straw Medication Administration: Whole meds with puree Supervision: Full assist for feeding Swallowing strategies  : Slow rate; Small bites/sips Postural changes: Position pt fully upright for meals Oral care recommendations: Oral care BID (2x/day); Oral care before ice chips/water Caregiver Recommendations: Avoid jello, ice cream, thin soups, popsicles; Remove water pitcher Treatment Plan Treatment Plan Treatment recommendations: Therapy as outlined in treatment plan below Follow-up recommendations: Skilled nursing-short term rehab (<3 hours/day) Functional status assessment: Patient has had a recent decline in their functional status and demonstrates the ability to make significant improvements in function in a reasonable and predictable amount of time. Treatment frequency: Min 2x/week Treatment duration: 2 weeks Interventions: Aspiration precaution training; Compensatory techniques; Patient/family education; Trials of upgraded texture/liquids; Diet toleration management by SLP; Respiratory muscle strength training; Oropharyngeal exercises Recommendations Recommendations for follow up therapy are one component of a multi-disciplinary discharge planning process, led by the attending physician.  Recommendations may  be updated based on patient status, additional functional criteria and insurance authorization. Assessment: Orofacial Exam: Orofacial Exam Oral Cavity - Dentition: Adequate natural dentition Anatomy: Anatomy: WFL Thin Liquids: Thin Liquids (Level 0) Thin Liquids : Impaired Bolus delivery method: Cup; Straw; Spoon Thin Liquid - Impairment: Oral Impairment; Pharyngeal impairment Lip Closure: Not impaired Tongue control during bolus hold: Escape to lateral buccal cavity/floor of mouth Bolus transport/lingual motion: Brisk tongue motion Oral residue: Trace residue lining oral structures Location of oral residue : Tongue Initiation of swallow : Posterior angle of the ramus Soft palate elevation: Complete Tongue base retraction: Trace column of contrast or air between tongue base and PPW Laryngeal elevation: Complete superior movement of thyroid cartilage with complete approximation of arytenoids to epiglottic petiole Anterior hyoid excursion: Complete Epiglottic movement: Partial Laryngeal vestibule closure: Incomplete Pharyngeal stripping wave : Present - diminished Pharyngeal contraction (A/P view only): N/A Pharyngoesophageal segment opening: Partial distention/partial duration, partial obstruction of flow Pharyngeal residue: Trace residue within or on pharyngeal structures Location of pharyngeal residue: Valleculae; Pharyngeal wall; Pyriform sinuses; Tongue base Penetration/Aspiration Scale (PAS) score: 7.  Material enters airway, passes BELOW cords and not ejected out despite cough attempt by patient  Mildly Thick Liquids: Mildly thick liquids (Level 2, nectar thick) Mildly thick liquids (Level 2, nectar thick): WFL  Moderately  Thick Liquids: Moderately thick liquids (Level 3, honey thick) Moderately thick liquids (Level 3, honey thick): WFL  Puree: Puree Puree: WFL Solid: Solid Solid: WFL Pill: No data recorded Compensatory Strategies: Compensatory Strategies Compensatory strategies: No   General Information:  Caregiver present: No  Diet Prior to this Study: Dysphagia 1 (pureed); Mildly thick liquids (Level 2, nectar thick)   Temperature : Normal   No data recorded  Supplemental O2: Nasal cannula   History of Recent Intubation: No  Behavior/Cognition: Alert; Cooperative; Pleasant mood Self-Feeding Abilities: Dependent for feeding Baseline vocal quality/speech: Hypophonia/low volume Volitional Cough: Able to elicit Volitional Swallow: Able to elicit No data recorded Goal Planning: Prognosis for improved oropharyngeal function: Good Barriers to Reach Goals: Severity of deficits; Overall medical prognosis No data recorded Patient/Family Stated Goal: N/A Consulted and agree with results and recommendations: Patient; Nurse Pain: Pain Assessment Pain Assessment: Faces Faces Pain Scale: 4 Breathing: 0 Negative Vocalization: 0 Facial Expression: 0 Body Language: 0 Consolability: 0 PAINAD Score: 0 Facial Expression: 0 Body Movements: 1 Muscle Tension: 0 Compliance with ventilator (intubated pts.): N/A Vocalization (extubated pts.): 1 CPOT Total: 2 Pain Location: BLE with movement Pain Descriptors / Indicators: Grimacing; Guarding; Sore Pain Intervention(s): Limited activity within patient's tolerance; Monitored during session; Repositioned End of Session: Start Time:SLP Start Time (ACUTE ONLY): 1537 Stop Time: SLP Stop Time (ACUTE ONLY): 1620 Time Calculation:SLP Time Calculation (min) (ACUTE ONLY): 43 min Charges: SLP Evaluations $ SLP Speech Visit: 1 Visit SLP Evaluations $BSS Swallow: 1 Procedure $MBS Swallow: 1 Procedure $Swallowing Treatment: 1 Procedure SLP visit diagnosis: SLP Visit Diagnosis: Dysphagia, unspecified (R13.10) Past Medical History: Past Medical History: Diagnosis Date  Anemia in CKD (chronic kidney disease)   Anxiety   Dementia with behavioral disturbance (HCC)   Diabetes mellitus with neuropathy (HCC)   Diabetic neuropathy (HCC)   DJD (degenerative joint disease)   GERD (gastroesophageal reflux disease)    H/O aortic valve replacement   Hyperlipidemia   Hypertension   Neuromuscular disorder (HCC)   neuropathy in feet  Obesity   Sleep apnea   Stroke ([place])   " light stroke"  Vitamin D deficiency   Wears glasses  Past Surgical History: Past Surgical History: Procedure Laterality Date  ANKLE SURGERY    Left tendon repair  ANTERIOR APPROACH HEMI HIP ARTHROPLASTY Left 08/13/2020  Procedure: ANTERIOR APPROACH HEMI HIP ARTHROPLASTY;  Surgeon: Leandrew Koyanagi, MD;  Location: [place];  Service: Orthopedics;  Laterality: Left;  AORTIC VALVE REPLACEMENT  03/1994  CARDIAC CATHETERIZATION    1996 [facility]  CERVICAL SPINE SURGERY    COLONOSCOPY    HEMORRHOID SURGERY    IR PARACENTESIS  10/31/2019  KNEE ARTHROSCOPY Right 04/13/2015  Procedure: RIGHT KNEE ARTHROSCOPY WITH DEBRIDEMENT AND PARTIAL MEDIAL MENISCECTOMY;  Surgeon: Mcarthur Rossetti, MD;  Location: [place];  Service: Orthopedics;  Laterality: Right;  KNEE ARTHROSCOPY W/ MENISCECTOMY Right 04/13/2015  LUMBAR FUSION    RIGHT HEART CATH N/A 10/28/2019  Procedure: RIGHT HEART CATH;  Surgeon: Leonie Man, MD;  Location: [place] [facility];  Service: Cardiovascular;  Laterality: N/A; Amelia H. Roddie Mc, CCC-SLP Speech Language Pathologist Wende Bushy 04/25/2022, 6:08 PM  CT HEAD WO CONTRAST (5MM)  Result Date: 04/21/2022 CLINICAL DATA:  Neuro deficit.  Stroke suspected. EXAM: CT HEAD WITHOUT CONTRAST TECHNIQUE: Contiguous axial images were obtained from the base of the skull through the vertex without intravenous contrast. RADIATION DOSE REDUCTION: This exam was performed according to the departmental dose-optimization program which includes automated  exposure control, adjustment of the mA and/or kV according to patient size and/or use of iterative reconstruction technique. COMPARISON:  CT Brain 09/23/21 FINDINGS: Brain: No evidence of acute infarction, hemorrhage, hydrocephalus, extra-axial collection or mass lesion/mass effect. Sequela of moderate to severe chronic  microvascular ischemic change. Chronic small right cerebellar infarct. Vascular: No hyperdense vessel or unexpected calcification. Skull: Normal. Negative for fracture or focal lesion. Sinuses/Orbits: Trace right mastoid effusion. No middle ear effusion. There is impacted cerumen in the left EAC. Right lens replacement. Redemonstrated mucosal thickening in the right maxillary sinus and dense inspissated secretions or chronic fungal colonization. Other: None IMPRESSION: 1. No hemorrhage or CT evidence of an acute cortical infarct. 2. Sequela of moderate to severe chronic microvascular ischemic change. Chronic small right cerebellar infarct. Electronically Signed   By: Marin Roberts M.D.   On: 04/21/2022 15:27   DG CHEST PORT 1 VIEW  Result Date: 04/20/2022 CLINICAL DATA:  Aspiration. EXAM: PORTABLE CHEST 1 VIEW COMPARISON:  04/14/2022 FINDINGS: Right arm PICC line tip terminates in the distal SVC. Previous median sternotomy. Stable mild cardiac enlargement. Persistent small bilateral pleural effusions, right greater than left. New bilateral lower lobe zone peribronchovascular opacities are identified along with scattered areas of subsegmental atelectasis. IMPRESSION: 1. New bilateral lower lobe peribronchovascular opacities compatible with aspiration. 2. Persistent small bilateral pleural effusions, right greater than left. Electronically Signed   By: Kerby Moors M.D.   On: 04/20/2022 09:44   [place] EKG SITE RITE  Result Date: 04/15/2022 If Site Rite image not attached, placement could not be confirmed due to current cardiac rhythm.  CT Angio Abd/Pel w/ and/or w/o  Result Date: 04/14/2022 CLINICAL DATA:  Retroperitoneal bleed suspected, extraperitoneal bleed. EXAM: CTA ABDOMEN AND PELVIS WITHOUT AND WITH CONTRAST TECHNIQUE: Multidetector CT imaging of the abdomen and pelvis was performed using the standard protocol during bolus administration of intravenous contrast. Multiplanar reconstructed images and  MIPs were obtained and reviewed to evaluate the vascular anatomy. RADIATION DOSE REDUCTION: This exam was performed according to the departmental dose-optimization program which includes automated exposure control, adjustment of the mA and/or kV according to patient size and/or use of iterative reconstruction technique. CONTRAST:  69mL OMNIPAQUE IOHEXOL 350 MG/ML SOLN COMPARISON:  Noncontrast CT earlier today. FINDINGS: VASCULAR Aorta: Moderate atherosclerosis. No aneurysm, dissection or acute findings. Celiac: Calcified plaque at the origin causes mild stenosis. Distal branch vessels are patent. No aneurysm, dissection or acute findings. SMA: Mild diffuse atheromatous plaque with mild areas of stenosis. No aneurysm or acute findings. Renals: Mild plaque in the proximal renal arteries causing varying degrees of stenosis up to moderate. No aneurysm or acute findings. IMA: Patent. Inflow: Mild atherosclerosis without acute findings. Proximal Outflow: Bilateral common femoral and visualized portions of the superficial and profunda femoral arteries are patent without evidence of aneurysm, dissection, vasculitis or significant stenosis. Retroperitoneal and extraperitoneal hematomas. There is no active arterial extravasation within any of the retroperitoneal or extraperitoneal bleed. Veins: Venous phase imaging demonstrates patency of the iliac veins and IVC. Portal, splenic and mesenteric veins are patent. No areas of active extravasation or seen on venous phase. Review of the MIP images confirms the above findings. NON-VASCULAR Lower chest: Right pleural effusion with pleural enhancement, unchanged from earlier today. Hepatobiliary: Nodular hepatic contours typical of cirrhosis. Water density there is a 13 mm low-density lesion in the left lobe of the liver series 12, image 21, measuring slightly higher than simple fluid density, nonspecific. Multiple gallstones without evidence of gallbladder inflammation. Pancreas:  Parenchymal atrophy. No ductal dilatation or inflammation. Spleen: No splenomegaly. Adrenals/Urinary Tract: No adrenal nodule or hemorrhage. Bilateral renal parenchymal atrophy with multifocal areas of parenchymal scarring. Parenchymal calcifications in the upper pole the right kidney. No hydronephrosis. Displaced to the right due to left pelvic fluid collections. The bladder appears thick walled which is nonspecific. Stomach/Bowel: No bowel obstruction or inflammation. There is mild colonic distension with stool proximally and air distally. The appendix is not seen. Lymphatic: No gross adenopathy. Reproductive: Uterine vascular calcifications again seen. Other: Again seen multiple areas of presume extraperitoneal and retroperitoneal hematoma/hemorrhage. Largest area is in the left pelvis measuring approximately 12.9 x 7 cm with hematocrit level, measured on series 12, image 78. This may be contiguous with the left rectus abdominus musculature. More superiorly is an area of presumed rectus hematoma measuring 6.1 x 5.1 cm, measured on series 12, image 67. There is stranding in the left retroperitoneum tracking anterior to the iliopsoas muscle that is primarily ill-defined. Stranding within the right extraperitoneal M with ill-defined hemorrhage. Fluid collections within the upper abdominal wall musculature just underneath the anterior ribs, 8.7 x 3.3 cm on the left series 12, image 30, 4.0 x 2.7 cm on the right series 12, image 31. There is no evidence of active extravasation within any of these hematomas. There is edema within the subcutaneous tissues of the left flank/abdominal wall as well as anterior to the lower abdominal wall musculature. Musculoskeletal: Unchanged from earlier today with L4 compression deformity. Left hip arthroplasty. IMPRESSION: 1. No evidence of active extravasation within any of the retroperitoneal or extraperitoneal fluid collections/presumed hematoma. 2. Multiple areas of presume  extraperitoneal and retroperitoneal hematoma/hemorrhage, largest area in the left pelvis measuring 12.9 x 7 cm, likely contiguous with the left rectus abdominus musculature. Allowing for differences in caliper placement in technique, no significant change in size of these collections from earlier today. Possibility of infection is considered but felt less likely. 3. Cirrhosis. Indeterminate 13 mm low-density lesion in the left lobe of the liver measuring slightly higher than simple fluid density. Recommend further evaluation with MRI on a nonemergent basis after resolution of acute event. 4. Cholelithiasis without acute cholecystitis. 5. Aortic and branch atherosclerosis with areas of arterial stenosis up to moderate in degree, as described. 6. Additional chronic changes are stable from earlier today. Aortic Atherosclerosis (ICD10-I70.0). Electronically Signed   By: Keith Rake M.D.   On: 04/14/2022 16:41   CT CHEST ABDOMEN PELVIS WO CONTRAST  Result Date: 04/14/2022 CLINICAL DATA:  Generalized pain.  Sepsis.  Nausea. EXAM: CT CHEST, ABDOMEN AND PELVIS WITHOUT CONTRAST TECHNIQUE: Multidetector CT imaging of the chest, abdomen and pelvis was performed following the standard protocol without IV contrast. RADIATION DOSE REDUCTION: This exam was performed according to the departmental dose-optimization program which includes automated exposure control, adjustment of the mA and/or kV according to patient size and/or use of iterative reconstruction technique. COMPARISON:  Chest x-ray earlier 04/14/2022. Older x-rays. CT scan abdomen pelvis 2017 July FINDINGS: CT CHEST FINDINGS Cardiovascular: Heart is enlarged. Trace pericardial fluid. Prominent calcifications along the mitral valve. Postop chest with prosthetic aortic valve. The thoracic aorta has scattered calcified atherosclerotic plaque. Diameter of the ascending aorta at the level of the right pulmonary artery measures 4.3 by 4.0 cm. Mediastinum/Nodes: Small  thyroid gland. No specific abnormal lymph node enlargement present in the axillary region, hila or mediastinum on this noncontrast exam. Prominent subcarinal node is noted measuring 17 by 10 mm on series 2, image 30. Normal caliber  thoracic esophagus. Lungs/Pleura: Diffuse breathing motion. Small right-sided pleural effusion identified with some adjacent parenchymal opacities. Associated pleural thickening. This effusion was seen in 2017 and with slightly larger that time. More thickening today of the pleura. Atelectasis versus infiltrate. There is some bandlike areas of opacity as well in the middle lobe. Scattered areas of interstitial septal thickening are identified. There are few patchy ground-glass areas as well in the right upper lobe towards the apex such as series 3, image 42, series 3, image 53. Additional areas scattered in the left upper lobe, middle lobe and superior segment of left lower lobe. This has a differential including atypical or viral process. Recommend follow-up. Musculoskeletal: Osteopenia. Scattered degenerative changes noted along the spine. Of note the patient's arms were scanned at the patient's side. CT ABDOMEN PELVIS FINDINGS Hepatobiliary: Nodular contour to the liver. Please correlate for any known history of chronic liver disease. There are multiple stones in the nondilated gallbladder. Pancreas: Moderate pancreatic atrophy. No obvious mass. Appearance is similar to prior. Spleen: Spleen is grossly nonenlarged. Adrenals/Urinary Tract: Adrenal glands are grossly preserved. There is mild bilateral renal atrophy with some focal areas along the midportion left kidney laterally. Some calcifications noted along the hilum of the right kidney which very well could be vascular. No definite ureteral or renal stones otherwise. The bladder is somewhat decompressed with wall thickening but there is mass effect along the bladder from the adjacent fluid collections. Stomach/Bowel: Stomach is  mildly distended with fluid. The small bowel is nondilated. Large bowel is nondilated prominent right-sided colonic stool. Again evaluation is limited by significant motion. Vascular/Lymphatic: Diffuse vascular calcifications are identified with areas of significant stenosis suggested along branch vessels including SMA, renal arteries. Normal caliber IVC. Reproductive: Scattered calcifications along the uterus. These could be vascular. No separate adnexal mass. Other: Significant motion noted limited evaluation. Extensive areas of presumed hematoma identified. These include the rectus abdominis muscles bilaterally, left-greater-than-right. The left hematoma extends along the course of the entirety of the rectus abdominis muscle, cephalocaudal length approaching 29 cm. Transverse dimension at the level of the pelvis approximate 7.4 by 4.8 cm. Again the right-sided areas are smaller and are along the lower chest and upper abdomen regions. There is also a large extraperitoneal hematoma more on the left than on the right with mass effect along surrounding structures including the bladder. This extends along the margin of the iliacus muscle. Longitudinal length on coronal image 89 of series 5 measures a proximally 18.2 cm. Axial dimension maximally approaches 10.5 by 5.5 cm. Anasarca. Musculoskeletal: Streak artifact related to patient's left hip arthroplasty obscuring the surrounding tissues. Curvature and degenerative changes seen along the spine. Osteopenia. Multilevel stenosis identified along the lumbar spine. There is moderate compression of the superior endplate of L4 which was not seen in 2017 per the margins are somewhat sclerotic. Favor a subacute to chronic process. Please correlate with history. There is multilevel disc bulging. Degenerative changes seen along the pelvis. Critical Value/emergent results were called by telephone at the time of interpretation on 04/14/2022 at 1:59 pm to provider DAVID TAT , who  verbally acknowledged these results. IMPRESSION: 1. Large areas of presumed hematoma involving the rectus abdominis muscles bilaterally, left-greater-than-right. There is also large extraperitoneal hematoma along the margin of the iliopsoas muscle on the left. This extends along the margin of the bladder with mass effect. 2. Small right-sided pleural effusion with adjacent parenchymal opacities. This effusion was seen in 2017 and with slightly larger that time. More thickening  of the pleura today. Atelectasis versus infiltrate. 3. Patchy areas of ground-glass in both lungs. This has a differential including atypical or viral process. Recommend follow-up. 4. Cardiomegaly.  Postop chest. 5. Ascending aortic aneurysm measuring 4.3 x 4.0 cm. 6. Nodular contour to the liver. Please correlate for any known history of chronic liver disease. 7. Cholelithiasis. 8. Diffuse vascular calcifications with areas of significant stenosis along branch vessels including SMA, renal arteries. 9. Moderate compression of the superior endplate of L4, not seen in 2017 per the margins are somewhat sclerotic. Favor a subacute to chronic process. Please correlate with history. 10. Aortic atherosclerosis. Aortic Atherosclerosis (ICD10-I70.0). Electronically Signed   By: Jill Side M.D.   On: 04/14/2022 14:04   DG Chest 2 View  Result Date: 04/14/2022 CLINICAL DATA:  Fall EXAM: CHEST - 2 VIEW COMPARISON:  Chest x-ray dated September 23, 2021 FINDINGS: Cardiac and mediastinal contours are unchanged post median sternotomy. Left-greater-than-right pleural effusions. No evidence of pneumothorax. Chronic appearing anterior second right rib fracture. IMPRESSION: Left-greater-than-right pleural effusions bibasilar atelectasis, similar to prior. Electronically Signed   By: Yetta Glassman M.D.   On: 04/14/2022 10:04    Sipriano Fendley Denton Brick, MD  [facility]  If 7PM-7AM, please contact night-coverage [email or web address] Password [email or web address] 04/27/2022, 3:22  PM   LOS: 13 days

## 2022-04-27 NOTE — Progress Notes (Signed)
PT Cancellation Note  Patient Details Name: Mary [name] MRN: [id number] DOB: [birth year]   Cancelled Treatment:    Reason Eval/Treat Not Completed: Patient declined, no reason specified;Other (comment)  Attempted 2nd time for PT treatment session. Pt sound asleep, when woken up, pt declining and showing hesitancy to mobilize this session. Pt wishing to get more rest. PT will re-attempt as schedule/time permits.   Wonda Olds 04/27/2022, 10:27 AM

## 2022-04-27 NOTE — Progress Notes (Signed)
PT Cancellation Note  Patient Details Name: JUSTYN BOYSON MRN: [id number] DOB: [birth year]   Cancelled Treatment:    Reason Eval/Treat Not Completed: Other (comment)  Nursing requesting come back at later time. PT will follow up accordingly based upon availability and as schedule permits.   Wonda Olds 04/27/2022, 9:16 AM

## 2022-04-27 NOTE — Progress Notes (Addendum)
Palliative: Mary [name] is resting quietly in bed.  She appears acutely/chronically ill and very frail.  She will make an somewhat keep eye contact.  She is able to make her needs known.  There is no family at bedside at this time.  We talk about her acute and chronic health concerns.  I shared that we are still trying to get her to short-term rehab.  She states agreement and understanding.  I encouraged her to eat as much as she can and participate in PT. Mary [name] PT/INR remain high, pharmacy following.  Call to husband of 49 years, Mary [name].  No answer, left voicemail message.  Conference with attending, bedside nursing staff, transition of care team related to patient condition, needs, goals of care, disposition.  Plan:   At this point continue to treat the treatable but no CPR or intubation.  Short-term rehab at [facility] with ultimate goal of returning to ALF in [place] where she has been for about 1 year.  PMT to follow.  ADDENDUM: Phone call from spouse.  Attending present also.  Hospice care discussed in detail.  At this point Mary [name] and her husband would like a trial at [facility] or for short-term rehab.  We talk about time for outcomes, if she is not showing improvement within a week, she will likely not be able to improve.  At this point, he is agreeable to outpatient palliative services with hospice of [facility].  Considering transition to hospice care if fails short-term rehab.  50 minutes  Mary [name] Palliative medicine team Team phone [phone] Greater than 50% of this time was spent counseling and coordinating care related to the above assessment and plan.

## 2022-04-27 NOTE — Care Management Important Message (Signed)
Important Message  Patient Details  Name: Mary [name] MRN: [id number] Date of Birth: 05-22-1940   Medicare Important Message Given:  Yes     Tommy Medal 04/27/2022, 12:00 PM

## 2022-04-27 NOTE — Progress Notes (Signed)
[place] for Coumadin Indication: mechanical aortic valve   Allergies  Allergen Reactions   Tape Itching    Redness, Please use "paper" tape only    Patient Measurements: Height: 5\' 7"  (170.2 cm) Weight: 66.5 kg (146 lb 9.7 oz) IBW/kg (Calculated) : 61.6 HEPARIN DW (KG): 63.7   Vital Signs: Temp: 98.8 F (37.1 C) (02/08 0358) BP: 129/75 (02/08 0358) Pulse Rate: 92 (02/08 0358)  Labs: Recent Labs    04/25/22 0433 04/25/22 1437 04/26/22 0440 04/27/22 0513  HGB 8.3*  --  8.9* 8.8*  HCT 27.5*  --  30.1* 28.2*  PLT 139*  --  151 160  LABPROT 23.1*  --  31.3* 36.6*  INR 2.1*  --  3.1* 3.7*  HEPARINUNFRC 0.18* 0.25* 0.50  --   CREATININE 1.16*  --   --  1.04*     Estimated Creatinine Clearance: 39.2 mL/min (A) (by C-G formula based on SCr of 1.04 mg/dL (H)).   Medical History: Past Medical History:  Diagnosis Date   Anemia in CKD (chronic kidney disease)    Anxiety    Dementia with behavioral disturbance (HCC)    Diabetes mellitus with neuropathy (HCC)    Diabetic neuropathy (HCC)    DJD (degenerative joint disease)    GERD (gastroesophageal reflux disease)    H/O aortic valve replacement    Hyperlipidemia    Hypertension    Neuromuscular disorder (HCC)    neuropathy in feet   Obesity    Sleep apnea    Stroke ([place])    " light stroke"   Vitamin D deficiency    Wears glasses     Assessment: 82 year old female with a history of mechanical aortic valve on warfarin presenting from [facility] with generalized weakness for 3 to 4 days. Patient found to have extraperitoneal and retroperitoneal bleed with INR reversed. Now stabilized and resumed on anticoagulation conservatively on 2/2 with heparin for mechanical AVR. MD requests heparin level goal 0.3-0.5 with no boluses. Warfarin resumed on 2/4 with goal ~2.5.   Unfortunately INR is up to 3.7 today after holding dose last night. Hemoglobin stable at 8.8, no bleeding issues  noted. Patient refusing pureed food/meals making it more difficult to manage anticoagulation. Appreciate dietitian's help with patient.   Goal of Therapy:  INR 2.5-3.5 (~2.5) Monitor platelets by anticoagulation protocol: Yes   Plan:  If discharging today, my anticoagulation plan would be to continue to hold warfarin another 2 days and restart low dose of 1mg  daily on Saturday evening 2/10. Would recommend INR check early next week to re-evaluate.    Erin Hearing PharmD., BCPS Clinical Pharmacist 04/27/2022 7:42 AM

## 2022-04-28 DIAGNOSIS — A419 Sepsis, unspecified organism: Secondary | ICD-10-CM | POA: Diagnosis not present

## 2022-04-28 LAB — CBC
HCT: 30 % — ABNORMAL LOW (ref 36.0–46.0)
Hemoglobin: 9 g/dL — ABNORMAL LOW (ref 12.0–15.0)
MCH: 31.6 pg (ref 26.0–34.0)
MCHC: 30 g/dL (ref 30.0–36.0)
MCV: 105.3 fL — ABNORMAL HIGH (ref 80.0–100.0)
Platelets: 155 10*3/uL (ref 150–400)
RBC: 2.85 MIL/uL — ABNORMAL LOW (ref 3.87–5.11)
RDW: 19.6 % — ABNORMAL HIGH (ref 11.5–15.5)
WBC: 5.9 10*3/uL (ref 4.0–10.5)
nRBC: 0 % (ref 0.0–0.2)

## 2022-04-28 LAB — PROTIME-INR
INR: 4.2 (ref 0.8–1.2)
Prothrombin Time: 40.3 seconds — ABNORMAL HIGH (ref 11.4–15.2)

## 2022-04-28 MED ORDER — SACCHAROMYCES BOULARDII 250 MG PO CAPS: 250.0000 mg | ORAL_CAPSULE | Freq: Two times a day (BID) | ORAL | 2 refills | Status: AC

## 2022-04-28 MED ORDER — WARFARIN SODIUM 1 MG PO TABS: 1.0000 mg | ORAL_TABLET | Freq: Every evening | ORAL | 1 refills | Status: AC

## 2022-04-28 MED ORDER — HYDROCODONE-ACETAMINOPHEN 5-325 MG PO TABS: 1.0000 | ORAL_TABLET | Freq: Two times a day (BID) | ORAL | 0 refills | Status: AC | PRN

## 2022-04-28 MED ORDER — ACETAMINOPHEN 325 MG PO TABS: 650.0000 mg | ORAL_TABLET | Freq: Four times a day (QID) | ORAL | 0 refills | Status: AC | PRN

## 2022-04-28 MED ORDER — LOPERAMIDE HCL 2 MG PO CAPS: 2.0000 mg | ORAL_CAPSULE | ORAL | 0 refills | Status: AC | PRN

## 2022-04-28 MED ORDER — ALPRAZOLAM 0.5 MG PO TABS: 0.5000 mg | ORAL_TABLET | Freq: Two times a day (BID) | ORAL | 0 refills | Status: AC | PRN

## 2022-04-28 NOTE — Progress Notes (Signed)
ANTICOAGULATION CONSULT NOTE  Pharmacy Consult for Coumadin Indication: mechanical aortic valve   Allergies  Allergen Reactions   Tape Itching    Redness, Please use "paper" tape only    Patient Measurements: Height: 5' 7"$  (170.2 cm) Weight: 66.5 kg (146 lb 9.7 oz) IBW/kg (Calculated) : 61.6 HEPARIN DW (KG): 63.7   Vital Signs: Temp: 97.6 F (36.4 C) (02/09 0343) Temp Source: Oral (02/09 0343) BP: 118/95 (02/09 0343) Pulse Rate: 86 (02/09 0343)  Labs: Recent Labs    04/25/22 1437 04/26/22 0440 04/26/22 0440 04/27/22 0513 04/28/22 0410  HGB  --  8.9*   < > 8.8* 9.0*  HCT  --  30.1*  --  28.2* 30.0*  PLT  --  151  --  160 155  LABPROT  --  31.3*  --  36.6* 40.3*  INR  --  3.1*  --  3.7* 4.2*  HEPARINUNFRC 0.25* 0.50  --   --   --   CREATININE  --   --   --  1.04*  --    < > = values in this interval not displayed.     Estimated Creatinine Clearance: 39.2 mL/min (A) (by C-G formula based on SCr of 1.04 mg/dL (H)).   Medical History: Past Medical History:  Diagnosis Date   Anemia in CKD (chronic kidney disease)    Anxiety    Dementia with behavioral disturbance (HCC)    Diabetes mellitus with neuropathy (HCC)    Diabetic neuropathy (HCC)    DJD (degenerative joint disease)    GERD (gastroesophageal reflux disease)    H/O aortic valve replacement    Hyperlipidemia    Hypertension    Neuromuscular disorder (HCC)    neuropathy in feet   Obesity    Sleep apnea    Stroke ([place])    " light stroke"   Vitamin D deficiency    Wears glasses     Assessment: 82 year old female with a history of mechanical aortic valve on warfarin presenting from [facility] with generalized weakness for 3 to 4 days. Patient found to have extraperitoneal and retroperitoneal bleed with INR reversed. Now stabilized and resumed on anticoagulation conservatively on 2/2 with heparin for mechanical AVR. Marland Kitchen Warfarin resumed on 2/4 with goal ~2.5.   INR 3.7 > 4.2  Hemoglobin stable at  9.0, no bleeding issues noted. Patient refusing pureed food/meals making it more difficult to manage anticoagulation. Appreciate dietitian's help with patient.   Goal of Therapy:  INR 2.5-3.5 (~2.5) Monitor platelets by anticoagulation protocol: Yes   Plan:  Continue to hold warfarin Check INR daily Monitor H&H and s/s of bleeding.   Margot Ables, PharmD Clinical Pharmacist 04/28/2022 8:19 AM

## 2022-04-28 NOTE — Discharge Summary (Addendum)
Mary [name], is a 82 y.o. female  DOB September 21, 1940  MRN [id number].  Admission date:  04/14/2022  Admitting Physician  Orson Eva, MD  Discharge Date:  04/28/2022   Primary MD  Pcp, No  Recommendations for primary care physician for things to follow:   1)Repeat CBC, BMP and Repeat PT/INR on Monday 05/01/22 2)Do NOT start Coumadin until after repeat INR results are available on Monday 05/01/22----Please avoid giving more than 1 to 1.5 mg of Coumadin daily----INR Goal is 2.5 3)After that please check PT/INR every 48 to 72 hours as pt's INR has been erratic due to poor oral intake 4)Repeat CBC and BMP every Monday and Friday x 3 weeks starting 05/01/22 5)Avoid ibuprofen/Advil/Aleve/Motrin/Goody Powders/Naproxen/BC powders/Meloxicam/Diclofenac/Indomethacin and other Nonsteroidal anti-inflammatory medications as these will make you more likely to bleed and can cause stomach ulcers, can also cause Kidney problems.  6)Consider transition to Hospice if oral intake remains poor and failure to thrive persist 7)Dysphagia 1/Pured Diet with Nectar thickened Liquids--Speech pathology and dietitian/nutritionist consult advised  Admission Diagnosis  Elevated INR [R79.1] Sepsis due to undetermined organism ([place]) [A41.9] Pneumonia due to infectious organism, unspecified laterality, unspecified part of lung [J18.9] Sepsis, due to unspecified organism, unspecified whether acute organ dysfunction present [facility]) [A41.9]   Discharge Diagnosis  Elevated INR [R79.1] Sepsis due to undetermined organism ([place]) [A41.9] Pneumonia due to infectious organism, unspecified laterality, unspecified part of lung [J18.9] Sepsis, due to unspecified organism, unspecified whether acute organ dysfunction present [facility]) [A41.9]    Principal Problem:   Sepsis due to undetermined organism [facility]) Active Problems:   H/O mechanical aortic valve replacement    Anemia due to chronic kidney disease   Acute renal failure superimposed on stage 4 chronic kidney disease ([place])   Chronic diastolic CHF (congestive heart failure) (HCC)   CKD (chronic kidney disease) stage 4, GFR 15-29 ml/min (HCC)   Acquired thrombophilia (HCC)   Elevated INR   Aspiration pneumonitis (HCC)   Pneumonia due to infectious organism   Acute blood loss anemia   Retroperitoneal bleed   Palliative care by specialist   Protein-calorie malnutrition, severe      Past Medical History:  Diagnosis Date   Anemia in CKD (chronic kidney disease)    Anxiety    Dementia with behavioral disturbance ([place])    Diabetes mellitus with neuropathy ([place])    Diabetic neuropathy ([place])    DJD (degenerative joint disease)    GERD (gastroesophageal reflux disease)    H/O aortic valve replacement    Hyperlipidemia    Hypertension    Neuromuscular disorder (HCC)    neuropathy in feet   Obesity    Sleep apnea    Stroke ([place])    " light stroke"   Vitamin D deficiency    Wears glasses     Past Surgical History:  Procedure Laterality Date   ANKLE SURGERY     Left tendon repair   ANTERIOR APPROACH HEMI HIP ARTHROPLASTY Left 08/13/2020   Procedure: ANTERIOR APPROACH HEMI HIP ARTHROPLASTY;  Surgeon: Erlinda Hong,  Marylynn Pearson, MD;  Location: [place];  Service: Orthopedics;  Laterality: Left;   AORTIC VALVE REPLACEMENT  03/1994   CARDIAC CATHETERIZATION     1996 [facility]   CERVICAL SPINE SURGERY     COLONOSCOPY     HEMORRHOID SURGERY     IR PARACENTESIS  10/31/2019   KNEE ARTHROSCOPY Right 04/13/2015   Procedure: RIGHT KNEE ARTHROSCOPY WITH DEBRIDEMENT AND PARTIAL MEDIAL MENISCECTOMY;  Surgeon: Mcarthur Rossetti, MD;  Location: [place];  Service: Orthopedics;  Laterality: Right;   KNEE ARTHROSCOPY W/ MENISCECTOMY Right 04/13/2015   LUMBAR FUSION     RIGHT HEART CATH N/A 10/28/2019   Procedure: RIGHT HEART CATH;  Surgeon: Leonie Man, MD;  Location: [place] [facility];  Service: Cardiovascular;   Laterality: N/A;     HPI  from the history and physical done on the day of admission:     HPI: Mary [name] is a 82 year old female with a history of dementia, hypertension, hyperlipidemia, CKD stage III, anxiety, mechanical aortic valve, stroke, diastolic CHF, and anxiety presenting from [facility] with generalized weakness for 3 to 4 days.  She has had associated decreased oral intake and nausea.  The patient is a difficult historian.  The patient's spouse is at the bedside.  He is also a difficult historian.  Nevertheless, it appears that the patient was treated for pneumonia.  Review of the [facility] from [place] shows the patient was started on levofloxacin on 04/08/2022 for 5-day course.  Even with the antibiotics, the patient continued to have progressive generalized weakness.  She denies any headache, neck pain, chest pain, hemoptysis, vomiting, diarrhea.  She does complain of abdominal pain.  She states that the abdominal pain has been present for 3 to 4 days.  There is no hematochezia or melena.  She has some dyspnea with exertion. In the ED, the patient was afebrile hemodynamically stable with oxygen saturation 99% on room air.  WBC 10.8, hemoglobin 9.3, platelets 302,000.  Sodium 135, potassium 4.4, bicarbonate 24, serum creatinine 1.77.  The patient was started on IV ceftriaxone and azithromycin and IV fluids.  She was admitted for further evaluation and treatment.     Review of Systems: As mentioned in the history of present illness. All other systems reviewed and are negative   Hospital Course:     82 year old female with a history of dementia, hypertension, hyperlipidemia, CKD stage III, anxiety, mechanical aortic valve, stroke, diastolic CHF, and anxiety presenting from [facility] with generalized weakness for 3 to 4 days.  She has had associated decreased oral intake and nausea.  The patient is a difficult historian.  The patient's spouse is at the bedside.  He is also a difficult  historian.  Nevertheless, it appears that the patient was treated for pneumonia.  Review of the [facility] from [place] shows the patient was started on levofloxacin on 04/08/2022 for 5-day course.  Even with the antibiotics, the patient continued to have progressive generalized weakness.  She denies any headache, neck pain, chest pain, hemoptysis, vomiting, diarrhea.  She does complain of abdominal pain.  She states that the abdominal pain has been present for 3 to 4 days.  There is no hematochezia or melena.  She has some dyspnea with exertion. In the ED, the patient was afebrile hemodynamically stable with oxygen saturation 99% on room air.  WBC 10.8, hemoglobin 9.3, platelets 302,000.  Sodium 135, potassium 4.4, bicarbonate 24, serum creatinine 1.77.  The patient was started on IV ceftriaxone and  azithromycin and IV fluids.  She was admitted for further evaluation and treatment.  Assessment and Plan: 1)SIRS- due to extensive intra-abdominal hemorrhage -CT abd--- Large areas of presumed hematoma involving the rectus abdominis muscles bilaterally, left-greater-than-right. There is also large extraperitoneal hematoma along the margin of the iliopsoas muscle on the left. -presented with leukocytosis, tachycardia, elevated lactate -Blood cultures NGTD -UA neg for pyuria -Treated with IV Zosyn and subsequently transition to Augmentin on 04/18/2022; patient has completed antibiotic therapy at this moment. -   2)Abdominal pain/extraperitoneal/retroperitoneal bleed/ Acute Blood Loss Anemia -CT abdomen and pelvis--- Large areas of presumed hematoma involving the rectus abdominismuscles bilaterally, left-greater-than-right. There is also largeextraperitoneal hematoma along the margin of the iliopsoas muscle on the left -Status post 8 units PRBC transfused throughout hospitalization. -Patient has also received fresh frozen plasma transfusion. -Latest hemoglobin has remained stable >> 8 -Repeat CBC, BMP and Repeat  PT/INR on Monday 05/01/22 -Do NOT start Coumadin until after repeat INR results are available on Monday 05/01/22----Please avoid giving more than 1 to 1.5 mg of Coumadin daily----INR Goal is 2.5 -After that please check PT/INR every 48 to 72 hours as pt's INR has been erratic due to poor oral intake -Repeat CBC and BMP every Monday and Friday x 3 weeks starting 05/01/22   3)Supratherapeutic INR -Likely secondary to the patient's concomitant use of levofloxacin and decreased oral intake in the setting of warfarin use -Vitamin K 20 mg total given for the admission -9 units FFP given  total (last one ordered 04/18/22) -currently subtherapeutic and stable. -Heparin drip started. -Hemoglobin stable; no signs of overt bleeding. - INR goal of 2.5. -04/28/22 -INR continues to trend up -Continue to hold Coumadin probably through 04/30/2022 inclusive--please see discharge instructions -- No bleeding concerns at this time   4)Status post mechanical aortic valve replacement -Continue anticoagulation with INR goal of 2.5 (high risk of rebleeding) -Anticoagulation management as above #3   5)Disposition--- physical therapy eval appreciated recommends SNF rehab -If patient does Not do well with acute rehab at SNF may have to transition to hospice especially if oral intake remains poor -Discharged to SNF facility Consider transition to Hospice if oral intake remains poor and failure to thrive persist   Aspiration pneumonitis -initially started on zosyn -Completed Augmentin -Continue the use of Mucinex, flutter valve and as needed bronchodilator.   Essential hypertension -Vital signs stable -  Dementia without behavioral disturbance -At risk for hospital delirium -Continue constant reorientation.   Mixed hyperlipidemia -Resume the use of simvastatin at time of discharge. -Continue heart healthy diet.   Anxiety/depression -Continue BuSpar   Acute on chronic renal failure--CKD stage IV -Renal  function has improved and back to baseline at this moment (creatinine 1.39) -due to hemodynamic changes, IV contrast --Renal function has remained stable. -Very poor oral intake, so avoid diuretics   Thrombocytopenia -due to acute medical illness -Continue to follow platelet counts trend/stability.   Hypokalemia -In the setting of GI losses and decreased oral intake -Magnesium within normal limit Repeat BMP as outlined in discharge instructions -Stable overall.   Diarrhea -C. difficile PCR negative -Most likely triggered by the use of antibiotics -Continue supportive care. -Continue treatment with Florastor and as needed Imodium.   Dysphagia/weakness and deconditioning -CT scan not demonstrating not acute ischemic process -Most likely secondary to aspiration and vocal cord dysfunction -Continue supportive care -Continue to follow recommendations by speech therapy. -Physical therapy recommending skilled nursing facility for rehabilitation at discharge. -Encourage oral intake -Dysphagia 1/Pured Diet with  Nectar thickened Liquids--Speech pathology and dietitian/nutritionist consult advised   Goals of Care -confirmed with patient, daughter, spouse>>DNR -Overall prognosis and future recovery is poor due to poor oral intake and comorbidities -Consider transition to Hospice if oral intake remains poor and failure to thrive persist      Family Communication:   Patient's husband updated at bedside 04/28/2022;    Code Status:  DNR   Antibiotics: Zosyn 1/26>>1/29 Amox/clav 1/29>>04/24/22 Azithro 1/26>>1/28  Discharge Condition: stable  Follow UP   Contact information for after-discharge care     Destination     [facility] .   Service: Skilled Nursing Contact information: [address] [place] [phone]                     Consults obtained - Palliative care/Gen surgery  Diet and Activity  recommendation:  As advised  Discharge Instructions    Discharge Instructions     Call MD for:  difficulty breathing, headache or visual disturbances   Complete by: As directed    Call MD for:  persistant dizziness or light-headedness   Complete by: As directed    Call MD for:  persistant nausea and vomiting   Complete by: As directed    Call MD for:  temperature >100.4   Complete by: As directed    Diet general   Complete by: As directed    Discharge instructions   Complete by: As directed    1)Repeat CBC, BMP and Repeat PT/INR on Monday 05/01/22 2)Do NOT start Coumadin until after repeat INR results are available on Monday 05/01/22----Please avoid giving more than 1 to 1.5 mg of Coumadin daily----INR Goal is 2.5 3)After that please check PT/INR every 48 to 72 hours as pt's INR has been erratic due to poor oral intake 4)Repeat CBC and BMP every Monday and Friday x 3 weeks starting 05/01/22 5)Avoid ibuprofen/Advil/Aleve/Motrin/Goody Powders/Naproxen/BC powders/Meloxicam/Diclofenac/Indomethacin and other Nonsteroidal anti-inflammatory medications as these will make you more likely to bleed and can cause stomach ulcers, can also cause Kidney problems.  6)Consider transition to Hospice if oral intake remains poor and failure to thrive persist 7)Dysphagia 1/Pured Diet with Nectar thickened Liquids--Speech pathology and dietitian/nutritionist consult advised   Discharge wound care:   Complete by: As directed    As above   Increase activity slowly   Complete by: As directed        Discharge Medications     Allergies as of 04/28/2022       Reactions   Tape Itching   Redness, Please use "paper" tape only        Medication List     STOP taking these medications    amoxicillin 500 MG capsule Commonly known as: AMOXIL   docusate sodium 100 MG capsule Commonly known as: COLACE   HYDROcodone-acetaminophen 10-325 MG tablet Commonly known as: NORCO Replaced by:  HYDROcodone-acetaminophen 5-325 MG tablet   QUEtiapine 25 MG tablet Commonly known as: SEROQUEL   simvastatin 40 MG tablet Commonly known as: ZOCOR   spironolactone 100 MG tablet Commonly known as: ALDACTONE   spironolactone 50 MG tablet Commonly known as: ALDACTONE   torsemide 20 MG tablet Commonly known as: DEMADEX       TAKE these medications    acetaminophen 325 MG tablet Commonly known as: TYLENOL Take 2 tablets (650 mg total) by mouth every 6 (six) hours as needed for mild pain, fever or headache (or Fever >/= 101).  ALPRAZolam 0.5 MG tablet Commonly known as: XANAX Take 1 tablet (0.5 mg total) by mouth every 12 (twelve) hours as needed for anxiety or sleep (agitation). What changed:  when to take this reasons to take this   buPROPion 150 MG 24 hr tablet Commonly known as: WELLBUTRIN XL Take 150 mg by mouth in the morning.   busPIRone 10 MG tablet Commonly known as: BUSPAR Take 10 mg by mouth 2 (two) times daily.   ferrous sulfate 325 (65 FE) MG tablet Take 325 mg by mouth every other day.   guaifenesin 400 MG Tabs tablet Commonly known as: HUMIBID E Take 400 mg by mouth every 8 (eight) hours as needed (cough/congestion).   HYDROcodone-acetaminophen 5-325 MG tablet Commonly known as: NORCO/VICODIN Take 1 tablet by mouth every 12 (twelve) hours as needed for moderate pain. Replaces: HYDROcodone-acetaminophen 10-325 MG tablet   loperamide 2 MG capsule Commonly known as: IMODIUM Take 1 capsule (2 mg total) by mouth as needed for diarrhea or loose stools.   multivitamin with minerals tablet Take 1 tablet by mouth in the morning.   pantoprazole 40 MG tablet Commonly known as: PROTONIX Take 1 tablet (40 mg total) by mouth daily before breakfast.   polyethylene glycol 17 g packet Commonly known as: MIRALAX / GLYCOLAX Take 17 g by mouth daily.   saccharomyces boulardii 250 MG capsule Commonly known as: FLORASTOR Take 1 capsule (250 mg total) by  mouth 2 (two) times daily.   sodium bicarbonate 650 MG tablet Take 650 mg by mouth 3 (three) times daily.   warfarin 1 MG tablet Commonly known as: Coumadin Take 1 tablet (1 mg total) by mouth every evening. Do NOT start Coumadin until after repeat INR on Monday 05/01/22 Start taking on: May 01, 2022 What changed:  medication strength how much to take when to take this additional instructions These instructions start on May 01, 2022. If you are unsure what to do until then, ask your doctor or other care provider.               Discharge Care Instructions  (From admission, onward)           Start     Ordered   04/28/22 0000  Discharge wound care:       Comments: As above   04/28/22 1114           Major procedures and Radiology Reports - PLEASE review detailed and final reports for all details, in brief -   DG Swallowing Func-Speech Pathology  Result Date: 04/25/2022 Table formatting from the original result was not included. Modified Barium Swallow Study Patient Details Name: TAMIYAH DUSEL MRN: [id number] Date of Birth: 1941/02/23 Today's Date: 04/25/2022 HPI/PMH: HPI: 82 year old female with a history of dementia, hypertension, hyperlipidemia, CKD stage III, anxiety, mechanical aortic valve, stroke, diastolic CHF, and anxiety presenting from [facility] with generalized weakness for 3 to 4 days.  She has had associated decreased oral intake and nausea.  The patient is a difficult historian.  The patient's spouse is at the bedside.  He is also a difficult historian.  Nevertheless, it appears that the patient was treated for pneumonia.  Review of the [facility] from [place] shows the patient was started on levofloxacin on 04/08/2022 for 5-day course.  Even with the antibiotics, the patient continued to have progressive generalized weakness.  She denies any headache, neck pain, chest pain, hemoptysis, vomiting, diarrhea.  She does complain of abdominal pain.  She states that  the  abdominal pain has been present for 3 to 4 days.  There is no hematochezia or melena.  She has some dyspnea with exertion.  In the ED, the patient was afebrile hemodynamically stable with oxygen saturation 99% on room air.  WBC 10.8, hemoglobin 9.3, platelets 302,000.  Sodium 135, potassium 4.4, bicarbonate 24, serum creatinine 1.77.  The patient was started on IV ceftriaxone and azithromycin and IV fluids.  She was admitted for further evaluation and treatment. On 04/20/22 Acute respiratory distress while eating breakfast experiencing choking event.  Transient use of nonrebreather and subsequently 5 L nasal cannula supplementation. BSE requested. Clinical Impression: Clinical Impression: Pt presents with mild oral dysphagia and mild/moderate pharyngeal dysphagia characterized by mildly decreased oral coordination, decreased epiglottic deflection, decreased laryngeal vestibule closure resulting in trace to min amonts of aspiration of thin liquids. Aspiration does not occur on every presentation of thin liquids, however pt is unable to complete compensatory strategies to minimize/eliminate aspiration and cough is not effective in clearing aspirates. Pt continues to be dependent on a feeder for all PO increasing her risk of aspiration. Regular, puree and NTL were noted to be [facility] with improved epiglottic deflection noted with heavier bolus presentations.  Pt continues to be very weak and lethargic negatively impacting the rate and ability to fully masticate solids. Recommend continue with current diet of D1/puree and NTL. ST will continue to follow acutely for ongoing dysphagia therapy. Pt is a good candidate for free water protocol. Factors that may increase risk of adverse event in presence of aspiration ([place] 2021): Limited mobility; Frail or deconditioned; Dependence for feeding and/or oral hygiene; Weak cough Recommendations/Plan: Swallowing Evaluation Recommendations Swallowing Evaluation  Recommendations Recommendations: PO diet PO Diet Recommendation: Dysphagia 1 (Pureed); Mildly thick liquids (Level 2, nectar thick) Liquid Administration via: Cup; Straw Medication Administration: Whole meds with puree Supervision: Full assist for feeding Swallowing strategies  : Slow rate; Small bites/sips Postural changes: Position pt fully upright for meals Oral care recommendations: Oral care BID (2x/day); Oral care before ice chips/water Caregiver Recommendations: Avoid jello, ice cream, thin soups, popsicles; Remove water pitcher Treatment Plan Treatment Plan Treatment recommendations: Therapy as outlined in treatment plan below Follow-up recommendations: Skilled nursing-short term rehab (<3 hours/day) Functional status assessment: Patient has had a recent decline in their functional status and demonstrates the ability to make significant improvements in function in a reasonable and predictable amount of time. Treatment frequency: Min 2x/week Treatment duration: 2 weeks Interventions: Aspiration precaution training; Compensatory techniques; Patient/family education; Trials of upgraded texture/liquids; Diet toleration management by SLP; Respiratory muscle strength training; Oropharyngeal exercises Recommendations Recommendations for follow up therapy are one component of a multi-disciplinary discharge planning process, led by the attending physician.  Recommendations may be updated based on patient status, additional functional criteria and insurance authorization. Assessment: Orofacial Exam: Orofacial Exam Oral Cavity - Dentition: Adequate natural dentition Anatomy: Anatomy: WFL Thin Liquids: Thin Liquids (Level 0) Thin Liquids : Impaired Bolus delivery method: Cup; Straw; Spoon Thin Liquid - Impairment: Oral Impairment; Pharyngeal impairment Lip Closure: Not impaired Tongue control during bolus hold: Escape to lateral buccal cavity/floor of mouth Bolus transport/lingual motion: Brisk tongue motion Oral  residue: Trace residue lining oral structures Location of oral residue : Tongue Initiation of swallow : Posterior angle of the ramus Soft palate elevation: Complete Tongue base retraction: Trace column of contrast or air between tongue base and PPW Laryngeal elevation: Complete superior movement of thyroid cartilage with complete approximation of arytenoids to epiglottic petiole Anterior hyoid  excursion: Complete Epiglottic movement: Partial Laryngeal vestibule closure: Incomplete Pharyngeal stripping wave : Present - diminished Pharyngeal contraction (A/P view only): N/A Pharyngoesophageal segment opening: Partial distention/partial duration, partial obstruction of flow Pharyngeal residue: Trace residue within or on pharyngeal structures Location of pharyngeal residue: Valleculae; Pharyngeal wall; Pyriform sinuses; Tongue base Penetration/Aspiration Scale (PAS) score: 7.  Material enters airway, passes BELOW cords and not ejected out despite cough attempt by patient  Mildly Thick Liquids: Mildly thick liquids (Level 2, nectar thick) Mildly thick liquids (Level 2, nectar thick): WFL  Moderately Thick Liquids: Moderately thick liquids (Level 3, honey thick) Moderately thick liquids (Level 3, honey thick): WFL  Puree: Puree Puree: WFL Solid: Solid Solid: WFL Pill: No data recorded Compensatory Strategies: Compensatory Strategies Compensatory strategies: No   General Information: Caregiver present: No  Diet Prior to this Study: Dysphagia 1 (pureed); Mildly thick liquids (Level 2, nectar thick)   Temperature : Normal   No data recorded  Supplemental O2: Nasal cannula   History of Recent Intubation: No  Behavior/Cognition: Alert; Cooperative; Pleasant mood Self-Feeding Abilities: Dependent for feeding Baseline vocal quality/speech: Hypophonia/low volume Volitional Cough: Able to elicit Volitional Swallow: Able to elicit No data recorded Goal Planning: Prognosis for improved oropharyngeal function: Good Barriers to Reach  Goals: Severity of deficits; Overall medical prognosis No data recorded Patient/Family Stated Goal: N/A Consulted and agree with results and recommendations: Patient; Nurse Pain: Pain Assessment Pain Assessment: Faces Faces Pain Scale: 4 Breathing: 0 Negative Vocalization: 0 Facial Expression: 0 Body Language: 0 Consolability: 0 PAINAD Score: 0 Facial Expression: 0 Body Movements: 1 Muscle Tension: 0 Compliance with ventilator (intubated pts.): N/A Vocalization (extubated pts.): 1 CPOT Total: 2 Pain Location: BLE with movement Pain Descriptors / Indicators: Grimacing; Guarding; Sore Pain Intervention(s): Limited activity within patient's tolerance; Monitored during session; Repositioned End of Session: Start Time:SLP Start Time (ACUTE ONLY): 1537 Stop Time: SLP Stop Time (ACUTE ONLY): 1620 Time Calculation:SLP Time Calculation (min) (ACUTE ONLY): 43 min Charges: SLP Evaluations $ SLP Speech Visit: 1 Visit SLP Evaluations $BSS Swallow: 1 Procedure $MBS Swallow: 1 Procedure $Swallowing Treatment: 1 Procedure SLP visit diagnosis: SLP Visit Diagnosis: Dysphagia, unspecified (R13.10) Past Medical History: Past Medical History: Diagnosis Date  Anemia in CKD (chronic kidney disease)   Anxiety   Dementia with behavioral disturbance (HCC)   Diabetes mellitus with neuropathy (HCC)   Diabetic neuropathy (HCC)   DJD (degenerative joint disease)   GERD (gastroesophageal reflux disease)   H/O aortic valve replacement   Hyperlipidemia   Hypertension   Neuromuscular disorder (HCC)   neuropathy in feet  Obesity   Sleep apnea   Stroke ([place])   " light stroke"  Vitamin D deficiency   Wears glasses  Past Surgical History: Past Surgical History: Procedure Laterality Date  ANKLE SURGERY    Left tendon repair  ANTERIOR APPROACH HEMI HIP ARTHROPLASTY Left 08/13/2020  Procedure: ANTERIOR APPROACH HEMI HIP ARTHROPLASTY;  Surgeon: Leandrew Koyanagi, MD;  Location: [place];  Service: Orthopedics;  Laterality: Left;  AORTIC VALVE REPLACEMENT  03/1994   CARDIAC CATHETERIZATION    1996 [facility]  CERVICAL SPINE SURGERY    COLONOSCOPY    HEMORRHOID SURGERY    IR PARACENTESIS  10/31/2019  KNEE ARTHROSCOPY Right 04/13/2015  Procedure: RIGHT KNEE ARTHROSCOPY WITH DEBRIDEMENT AND PARTIAL MEDIAL MENISCECTOMY;  Surgeon: Mcarthur Rossetti, MD;  Location: [place];  Service: Orthopedics;  Laterality: Right;  KNEE ARTHROSCOPY W/ MENISCECTOMY Right 04/13/2015  LUMBAR FUSION    RIGHT HEART CATH N/A 10/28/2019  Procedure: RIGHT HEART CATH;  Surgeon: Leonie Man, MD;  Location: [place] [facility];  Service: Cardiovascular;  Laterality: N/A; Amelia H. Roddie Mc, CCC-SLP Speech Language Pathologist Wende Bushy 04/25/2022, 6:08 PM  CT HEAD WO CONTRAST (5MM)  Result Date: 04/21/2022 CLINICAL DATA:  Neuro deficit.  Stroke suspected. EXAM: CT HEAD WITHOUT CONTRAST TECHNIQUE: Contiguous axial images were obtained from the base of the skull through the vertex without intravenous contrast. RADIATION DOSE REDUCTION: This exam was performed according to the departmental dose-optimization program which includes automated exposure control, adjustment of the mA and/or kV according to patient size and/or use of iterative reconstruction technique. COMPARISON:  CT Brain 09/23/21 FINDINGS: Brain: No evidence of acute infarction, hemorrhage, hydrocephalus, extra-axial collection or mass lesion/mass effect. Sequela of moderate to severe chronic microvascular ischemic change. Chronic small right cerebellar infarct. Vascular: No hyperdense vessel or unexpected calcification. Skull: Normal. Negative for fracture or focal lesion. Sinuses/Orbits: Trace right mastoid effusion. No middle ear effusion. There is impacted cerumen in the left EAC. Right lens replacement. Redemonstrated mucosal thickening in the right maxillary sinus and dense inspissated secretions or chronic fungal colonization. Other: None IMPRESSION: 1. No hemorrhage or CT evidence of an acute cortical infarct. 2. Sequela of  moderate to severe chronic microvascular ischemic change. Chronic small right cerebellar infarct. Electronically Signed   By: Marin Roberts M.D.   On: 04/21/2022 15:27   DG CHEST PORT 1 VIEW  Result Date: 04/20/2022 CLINICAL DATA:  Aspiration. EXAM: PORTABLE CHEST 1 VIEW COMPARISON:  04/14/2022 FINDINGS: Right arm PICC line tip terminates in the distal SVC. Previous median sternotomy. Stable mild cardiac enlargement. Persistent small bilateral pleural effusions, right greater than left. New bilateral lower lobe zone peribronchovascular opacities are identified along with scattered areas of subsegmental atelectasis. IMPRESSION: 1. New bilateral lower lobe peribronchovascular opacities compatible with aspiration. 2. Persistent small bilateral pleural effusions, right greater than left. Electronically Signed   By: Kerby Moors M.D.   On: 04/20/2022 09:44   [place] EKG SITE RITE  Result Date: 04/15/2022 If Site Rite image not attached, placement could not be confirmed due to current cardiac rhythm.  CT Angio Abd/Pel w/ and/or w/o  Result Date: 04/14/2022 CLINICAL DATA:  Retroperitoneal bleed suspected, extraperitoneal bleed. EXAM: CTA ABDOMEN AND PELVIS WITHOUT AND WITH CONTRAST TECHNIQUE: Multidetector CT imaging of the abdomen and pelvis was performed using the standard protocol during bolus administration of intravenous contrast. Multiplanar reconstructed images and MIPs were obtained and reviewed to evaluate the vascular anatomy. RADIATION DOSE REDUCTION: This exam was performed according to the departmental dose-optimization program which includes automated exposure control, adjustment of the mA and/or kV according to patient size and/or use of iterative reconstruction technique. CONTRAST:  70m OMNIPAQUE IOHEXOL 350 MG/ML SOLN COMPARISON:  Noncontrast CT earlier today. FINDINGS: VASCULAR Aorta: Moderate atherosclerosis. No aneurysm, dissection or acute findings. Celiac: Calcified plaque at the origin  causes mild stenosis. Distal branch vessels are patent. No aneurysm, dissection or acute findings. SMA: Mild diffuse atheromatous plaque with mild areas of stenosis. No aneurysm or acute findings. Renals: Mild plaque in the proximal renal arteries causing varying degrees of stenosis up to moderate. No aneurysm or acute findings. IMA: Patent. Inflow: Mild atherosclerosis without acute findings. Proximal Outflow: Bilateral common femoral and visualized portions of the superficial and profunda femoral arteries are patent without evidence of aneurysm, dissection, vasculitis or significant stenosis. Retroperitoneal and extraperitoneal hematomas. There is no active arterial extravasation within any of the retroperitoneal or extraperitoneal bleed. Veins: Venous  phase imaging demonstrates patency of the iliac veins and IVC. Portal, splenic and mesenteric veins are patent. No areas of active extravasation or seen on venous phase. Review of the MIP images confirms the above findings. NON-VASCULAR Lower chest: Right pleural effusion with pleural enhancement, unchanged from earlier today. Hepatobiliary: Nodular hepatic contours typical of cirrhosis. Water density there is a 13 mm low-density lesion in the left lobe of the liver series 12, image 21, measuring slightly higher than simple fluid density, nonspecific. Multiple gallstones without evidence of gallbladder inflammation. Pancreas: Parenchymal atrophy. No ductal dilatation or inflammation. Spleen: No splenomegaly. Adrenals/Urinary Tract: No adrenal nodule or hemorrhage. Bilateral renal parenchymal atrophy with multifocal areas of parenchymal scarring. Parenchymal calcifications in the upper pole the right kidney. No hydronephrosis. Displaced to the right due to left pelvic fluid collections. The bladder appears thick walled which is nonspecific. Stomach/Bowel: No bowel obstruction or inflammation. There is mild colonic distension with stool proximally and air distally.  The appendix is not seen. Lymphatic: No gross adenopathy. Reproductive: Uterine vascular calcifications again seen. Other: Again seen multiple areas of presume extraperitoneal and retroperitoneal hematoma/hemorrhage. Largest area is in the left pelvis measuring approximately 12.9 x 7 cm with hematocrit level, measured on series 12, image 78. This may be contiguous with the left rectus abdominus musculature. More superiorly is an area of presumed rectus hematoma measuring 6.1 x 5.1 cm, measured on series 12, image 67. There is stranding in the left retroperitoneum tracking anterior to the iliopsoas muscle that is primarily ill-defined. Stranding within the right extraperitoneal M with ill-defined hemorrhage. Fluid collections within the upper abdominal wall musculature just underneath the anterior ribs, 8.7 x 3.3 cm on the left series 12, image 30, 4.0 x 2.7 cm on the right series 12, image 31. There is no evidence of active extravasation within any of these hematomas. There is edema within the subcutaneous tissues of the left flank/abdominal wall as well as anterior to the lower abdominal wall musculature. Musculoskeletal: Unchanged from earlier today with L4 compression deformity. Left hip arthroplasty. IMPRESSION: 1. No evidence of active extravasation within any of the retroperitoneal or extraperitoneal fluid collections/presumed hematoma. 2. Multiple areas of presume extraperitoneal and retroperitoneal hematoma/hemorrhage, largest area in the left pelvis measuring 12.9 x 7 cm, likely contiguous with the left rectus abdominus musculature. Allowing for differences in caliper placement in technique, no significant change in size of these collections from earlier today. Possibility of infection is considered but felt less likely. 3. Cirrhosis. Indeterminate 13 mm low-density lesion in the left lobe of the liver measuring slightly higher than simple fluid density. Recommend further evaluation with MRI on a  nonemergent basis after resolution of acute event. 4. Cholelithiasis without acute cholecystitis. 5. Aortic and branch atherosclerosis with areas of arterial stenosis up to moderate in degree, as described. 6. Additional chronic changes are stable from earlier today. Aortic Atherosclerosis (ICD10-I70.0). Electronically Signed   By: Keith Rake M.D.   On: 04/14/2022 16:41   CT CHEST ABDOMEN PELVIS WO CONTRAST  Result Date: 04/14/2022 CLINICAL DATA:  Generalized pain.  Sepsis.  Nausea. EXAM: CT CHEST, ABDOMEN AND PELVIS WITHOUT CONTRAST TECHNIQUE: Multidetector CT imaging of the chest, abdomen and pelvis was performed following the standard protocol without IV contrast. RADIATION DOSE REDUCTION: This exam was performed according to the departmental dose-optimization program which includes automated exposure control, adjustment of the mA and/or kV according to patient size and/or use of iterative reconstruction technique. COMPARISON:  Chest x-ray earlier 04/14/2022. Older x-rays. CT scan  abdomen pelvis 2017 July FINDINGS: CT CHEST FINDINGS Cardiovascular: Heart is enlarged. Trace pericardial fluid. Prominent calcifications along the mitral valve. Postop chest with prosthetic aortic valve. The thoracic aorta has scattered calcified atherosclerotic plaque. Diameter of the ascending aorta at the level of the right pulmonary artery measures 4.3 by 4.0 cm. Mediastinum/Nodes: Small thyroid gland. No specific abnormal lymph node enlargement present in the axillary region, hila or mediastinum on this noncontrast exam. Prominent subcarinal node is noted measuring 17 by 10 mm on series 2, image 30. Normal caliber thoracic esophagus. Lungs/Pleura: Diffuse breathing motion. Small right-sided pleural effusion identified with some adjacent parenchymal opacities. Associated pleural thickening. This effusion was seen in 2017 and with slightly larger that time. More thickening today of the pleura. Atelectasis versus  infiltrate. There is some bandlike areas of opacity as well in the middle lobe. Scattered areas of interstitial septal thickening are identified. There are few patchy ground-glass areas as well in the right upper lobe towards the apex such as series 3, image 42, series 3, image 53. Additional areas scattered in the left upper lobe, middle lobe and superior segment of left lower lobe. This has a differential including atypical or viral process. Recommend follow-up. Musculoskeletal: Osteopenia. Scattered degenerative changes noted along the spine. Of note the patient's arms were scanned at the patient's side. CT ABDOMEN PELVIS FINDINGS Hepatobiliary: Nodular contour to the liver. Please correlate for any known history of chronic liver disease. There are multiple stones in the nondilated gallbladder. Pancreas: Moderate pancreatic atrophy. No obvious mass. Appearance is similar to prior. Spleen: Spleen is grossly nonenlarged. Adrenals/Urinary Tract: Adrenal glands are grossly preserved. There is mild bilateral renal atrophy with some focal areas along the midportion left kidney laterally. Some calcifications noted along the hilum of the right kidney which very well could be vascular. No definite ureteral or renal stones otherwise. The bladder is somewhat decompressed with wall thickening but there is mass effect along the bladder from the adjacent fluid collections. Stomach/Bowel: Stomach is mildly distended with fluid. The small bowel is nondilated. Large bowel is nondilated prominent right-sided colonic stool. Again evaluation is limited by significant motion. Vascular/Lymphatic: Diffuse vascular calcifications are identified with areas of significant stenosis suggested along branch vessels including SMA, renal arteries. Normal caliber IVC. Reproductive: Scattered calcifications along the uterus. These could be vascular. No separate adnexal mass. Other: Significant motion noted limited evaluation. Extensive areas of  presumed hematoma identified. These include the rectus abdominis muscles bilaterally, left-greater-than-right. The left hematoma extends along the course of the entirety of the rectus abdominis muscle, cephalocaudal length approaching 29 cm. Transverse dimension at the level of the pelvis approximate 7.4 by 4.8 cm. Again the right-sided areas are smaller and are along the lower chest and upper abdomen regions. There is also a large extraperitoneal hematoma more on the left than on the right with mass effect along surrounding structures including the bladder. This extends along the margin of the iliacus muscle. Longitudinal length on coronal image 89 of series 5 measures a proximally 18.2 cm. Axial dimension maximally approaches 10.5 by 5.5 cm. Anasarca. Musculoskeletal: Streak artifact related to patient's left hip arthroplasty obscuring the surrounding tissues. Curvature and degenerative changes seen along the spine. Osteopenia. Multilevel stenosis identified along the lumbar spine. There is moderate compression of the superior endplate of L4 which was not seen in 2017 per the margins are somewhat sclerotic. Favor a subacute to chronic process. Please correlate with history. There is multilevel disc bulging. Degenerative changes seen along the  pelvis. Critical Value/emergent results were called by telephone at the time of interpretation on 04/14/2022 at 1:59 pm to provider DAVID TAT , who verbally acknowledged these results. IMPRESSION: 1. Large areas of presumed hematoma involving the rectus abdominis muscles bilaterally, left-greater-than-right. There is also large extraperitoneal hematoma along the margin of the iliopsoas muscle on the left. This extends along the margin of the bladder with mass effect. 2. Small right-sided pleural effusion with adjacent parenchymal opacities. This effusion was seen in 2017 and with slightly larger that time. More thickening of the pleura today. Atelectasis versus infiltrate. 3.  Patchy areas of ground-glass in both lungs. This has a differential including atypical or viral process. Recommend follow-up. 4. Cardiomegaly.  Postop chest. 5. Ascending aortic aneurysm measuring 4.3 x 4.0 cm. 6. Nodular contour to the liver. Please correlate for any known history of chronic liver disease. 7. Cholelithiasis. 8. Diffuse vascular calcifications with areas of significant stenosis along branch vessels including SMA, renal arteries. 9. Moderate compression of the superior endplate of L4, not seen in 2017 per the margins are somewhat sclerotic. Favor a subacute to chronic process. Please correlate with history. 10. Aortic atherosclerosis. Aortic Atherosclerosis (ICD10-I70.0). Electronically Signed   By: Jill Side M.D.   On: 04/14/2022 14:04   DG Chest 2 View  Result Date: 04/14/2022 CLINICAL DATA:  Fall EXAM: CHEST - 2 VIEW COMPARISON:  Chest x-ray dated September 23, 2021 FINDINGS: Cardiac and mediastinal contours are unchanged post median sternotomy. Left-greater-than-right pleural effusions. No evidence of pneumothorax. Chronic appearing anterior second right rib fracture. IMPRESSION: Left-greater-than-right pleural effusions bibasilar atelectasis, similar to prior. Electronically Signed   By: Yetta Glassman M.D.   On: 04/14/2022 10:04    Today   Subjective    [name] today has no new complaints  - Husband is at bedside, questions     -INR still  trending up -Discussed with pharmacist-   No Bleeding concerns  No fever  Or chills   No Nausea, Vomiting or Diarrhea   Patient has been seen and examined prior to discharge   Objective   Blood pressure (!) 118/95, pulse 86, temperature 97.6 F (36.4 C), temperature source Oral, resp. rate 16, height 5' 7"$  (1.702 m), weight 66.5 kg, SpO2 100 %.   Intake/Output Summary (Last 24 hours) at 04/28/2022 1204 Last data filed at 04/28/2022 0500 Gross per 24 hour  Intake 540 ml  Output --  Net 540 ml   Exam Gen:- Awake Alert, in no  acute distress , frail and chronically ill-appearing HEENT:- [place].AT, No sclera icterus Neck-Supple Neck,No JVD,.  Lungs-  CTAB , fair air movement bilaterally  CV- S1, S2 normal, RRR, metalic click  Abd-  +ve B.Sounds, Abd Soft, No tenderness,    Extremity/Skin:- +1   edema,   good pedal pulses  Psych-affect is flat, oriented x3 Neuro--Generalized weakness, -no new focal deficits, no tremors   Data Review   CBC w Diff:  Lab Results  Component Value Date   WBC 5.9 04/28/2022   HGB 9.0 (L) 04/28/2022   HCT 30.0 (L) 04/28/2022   PLT 155 04/28/2022   LYMPHOPCT 8 04/14/2022   MONOPCT 6 04/14/2022   EOSPCT 3 04/14/2022   BASOPCT 0 04/14/2022    CMP:  Lab Results  Component Value Date   NA 138 04/27/2022   K 3.1 (L) 04/27/2022   CL 101 04/27/2022   CO2 27 04/27/2022   BUN 31 (H) 04/27/2022   CREATININE 1.04 (H) 04/27/2022  CREATININE 1.28 (H) 07/07/2016   PROT 5.7 (L) 04/15/2022   ALBUMIN 2.6 (L) 04/27/2022   BILITOT 2.0 (H) 04/15/2022   ALKPHOS 59 04/15/2022   AST 42 (H) 04/15/2022   ALT 18 04/15/2022   Total Discharge time is about 33 minutes  Roxan Hockey M.D on 04/28/2022 at 12:04 PM  Go to [email or web address] -  for contact info  [facility] - Office  [phone]

## 2022-04-28 NOTE — Progress Notes (Signed)
Nsg Discharge Note  Admit Date:  04/14/2022 Discharge date: 04/28/2022   [name] to be D/C'd Nursing Home per MD order.  AVS completed.   Patient/caregiver able to verbalize understanding.  Discharge Medication: Allergies as of 04/28/2022       Reactions   Tape Itching   Redness, Please use "paper" tape only        Medication List     STOP taking these medications    amoxicillin 500 MG capsule Commonly known as: AMOXIL   docusate sodium 100 MG capsule Commonly known as: COLACE   HYDROcodone-acetaminophen 10-325 MG tablet Commonly known as: NORCO Replaced by: HYDROcodone-acetaminophen 5-325 MG tablet   QUEtiapine 25 MG tablet Commonly known as: SEROQUEL   simvastatin 40 MG tablet Commonly known as: ZOCOR   spironolactone 100 MG tablet Commonly known as: ALDACTONE   spironolactone 50 MG tablet Commonly known as: ALDACTONE   torsemide 20 MG tablet Commonly known as: DEMADEX       TAKE these medications    acetaminophen 325 MG tablet Commonly known as: TYLENOL Take 2 tablets (650 mg total) by mouth every 6 (six) hours as needed for mild pain, fever or headache (or Fever >/= 101).   ALPRAZolam 0.5 MG tablet Commonly known as: XANAX Take 1 tablet (0.5 mg total) by mouth every 12 (twelve) hours as needed for anxiety or sleep (agitation). What changed:  when to take this reasons to take this   buPROPion 150 MG 24 hr tablet Commonly known as: WELLBUTRIN XL Take 150 mg by mouth in the morning.   busPIRone 10 MG tablet Commonly known as: BUSPAR Take 10 mg by mouth 2 (two) times daily.   ferrous sulfate 325 (65 FE) MG tablet Take 325 mg by mouth every other day.   guaifenesin 400 MG Tabs tablet Commonly known as: HUMIBID E Take 400 mg by mouth every 8 (eight) hours as needed (cough/congestion).   HYDROcodone-acetaminophen 5-325 MG tablet Commonly known as: NORCO/VICODIN Take 1 tablet by mouth every 12 (twelve) hours as needed for moderate  pain. Replaces: HYDROcodone-acetaminophen 10-325 MG tablet   loperamide 2 MG capsule Commonly known as: IMODIUM Take 1 capsule (2 mg total) by mouth as needed for diarrhea or loose stools.   multivitamin with minerals tablet Take 1 tablet by mouth in the morning.   pantoprazole 40 MG tablet Commonly known as: PROTONIX Take 1 tablet (40 mg total) by mouth daily before breakfast.   polyethylene glycol 17 g packet Commonly known as: MIRALAX / GLYCOLAX Take 17 g by mouth daily.   saccharomyces boulardii 250 MG capsule Commonly known as: FLORASTOR Take 1 capsule (250 mg total) by mouth 2 (two) times daily.   sodium bicarbonate 650 MG tablet Take 650 mg by mouth 3 (three) times daily.   warfarin 1 MG tablet Commonly known as: Coumadin Take 1 tablet (1 mg total) by mouth every evening. Do NOT start Coumadin until after repeat INR on Monday 05/01/22 Start taking on: May 01, 2022 What changed:  medication strength how much to take when to take this additional instructions These instructions start on May 01, 2022. If you are unsure what to do until then, ask your doctor or other care provider.               Discharge Care Instructions  (From admission, onward)           Start     Ordered   04/28/22 0000  Discharge wound care:  Comments: As above   04/28/22 1114            Discharge Assessment: Vitals:   04/27/22 2043 04/28/22 0343  BP: 137/75 (!) 118/95  Pulse: (!) 110 86  Resp: 18 16  Temp: 97.9 F (36.6 C) 97.6 F (36.4 C)  SpO2: 100% 100%   Skin clean, dry and intact without evidence of skin break down, no evidence of skin tears noted. IV catheter discontinued intact. Site without signs and symptoms of complications - no redness or edema noted at insertion site, patient denies c/o pain - only slight tenderness at site.  Dressing with slight pressure applied.  D/c Instructions-Education: Discharge instructions given to patient/family  with verbalized understanding. D/c education completed with patient/family including follow up instructions, medication list, d/c activities limitations if indicated, with other d/c instructions as indicated by MD - patient able to verbalize understanding, all questions fully answered. Patient instructed to return to ED, call 911, or call MD for any changes in condition.  Patient escorted via [place], and D/C home via private auto.  Kathie Rhodes, RN 04/28/2022 11:55 AM

## 2022-04-28 NOTE — TOC Transition Note (Signed)
Transition of Care [facility]) - CM/SW Discharge Note   Patient Details  Name: Mary [name] MRN: [id number] Date of Birth: 1940-04-26  Transition of Care [facility]) CM/SW Contact:  Shade Flood, LCSW Phone Number: 04/28/2022, 11:57 AM   Clinical Narrative:     Pt stable for dc per MD. Updated [facility] and they can admit today.  DC clinical sent electronically. RN to call report. EMS arranged.  Pt's husband updated on the dc plan. There are no other TOC needs for dc.  Final next level of care: Skilled Nursing Facility Barriers to Discharge: Barriers Resolved   Patient Goals and CMS Choice CMS Medicare.gov Compare Post Acute Care list provided to:: Patient Represenative (must comment) Choice offered to / list presented to : Spouse  Discharge Placement                Patient chooses bed at: [facility] Patient to be transferred to facility by: EMS Name of family member notified: [name] Patient and family notified of of transfer: 04/28/22  Discharge Plan and Services Additional resources added to the After Visit Summary for   In-house Referral: Clinical Social Work Discharge Planning Services: CM Consult Post Acute Care Choice: [place]                               Social Determinants of Health ([place]) Interventions SDOH Screenings   Food Insecurity: No Food Insecurity (04/15/2022)  Housing: Low Risk  (04/15/2022)  Transportation Needs: No Transportation Needs (04/15/2022)  Utilities: Not At Risk (04/15/2022)  Tobacco Use: Low Risk  (04/14/2022)     Readmission Risk Interventions    04/16/2022   12:03 PM 12/19/2020    2:33 PM  Readmission Risk Prevention Plan  Transportation Screening Complete Complete  Medication Review Press photographer) Complete Complete  HRI or Home Care Consult Complete Complete  SW Recovery Care/Counseling Consult Complete Complete  Palliative Care Screening Not Applicable Not Applicable  Skilled Nursing Facility  Complete Complete

## 2022-04-28 NOTE — Discharge Instructions (Addendum)
1)Repeat CBC, BMP and Repeat PT/INR on Monday 05/01/22 2)Do NOT start Coumadin until after repeat INR results are available on Monday 05/01/22----Please avoid giving more than 1 to 1.5 mg of Coumadin daily----INR Goal is 2.5 3)After that please check PT/INR every 48 to 72 hours as pt's INR has been erratic due to poor oral intake 4)Repeat CBC and BMP every Monday and Friday x 3 weeks starting 05/01/22 5)Avoid ibuprofen/Advil/Aleve/Motrin/Goody Powders/Naproxen/BC powders/Meloxicam/Diclofenac/Indomethacin and other Nonsteroidal anti-inflammatory medications as these will make you more likely to bleed and can cause stomach ulcers, can also cause Kidney problems.  6)Consider transition to Hospice if oral intake remains poor and failure to thrive persist 7)Dysphagia 1/Pured Diet with Nectar thickened Liquids--Speech pathology and dietitian/nutritionist consult advised

## 2022-04-28 NOTE — Care Management Important Message (Signed)
Important Message  Patient Details  Name: Mary [name] MRN: [id number] Date of Birth: 1940/11/15   Medicare Important Message Given:  Yes     Tommy Medal 04/28/2022, 10:31 AM

## 2022-05-19 DEATH — deceased

## 2024-01-28 IMAGING — CT CT HEAD W/O CM
4 series · 15 of 47 positions shown, 17 images · non-contrast
Comparison: Head CT 12/18/2020.

CLINICAL DATA: 81-year-old female with history of trauma to the
head and neck after rolling out of bed this morning.



[Series 2: head w o · axial · 0.40mm/px · z∈[+37,+152]mm · 7 of 31 slices shown, 9 images]
[im 4/31  brain]
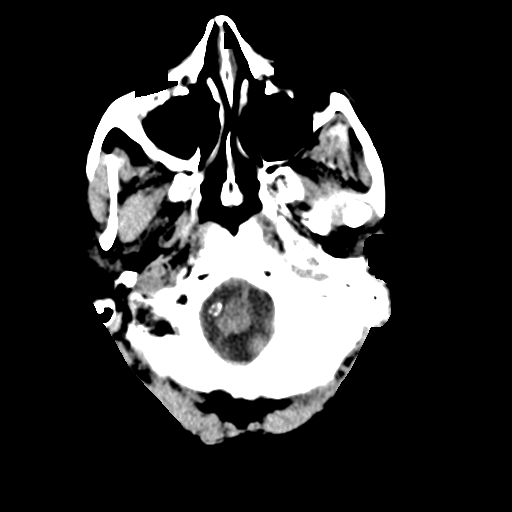
[im 4/31  bone]
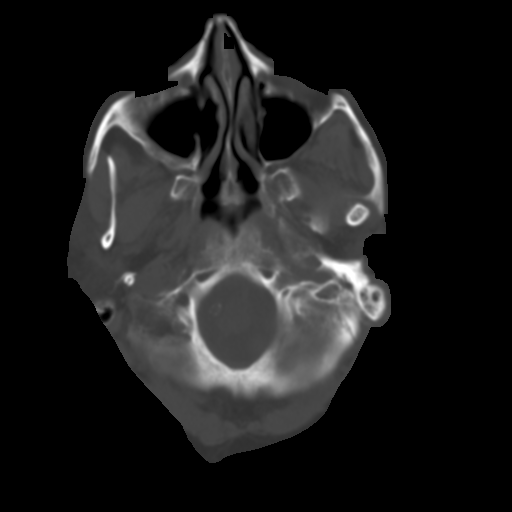
[im 8/31  brain]
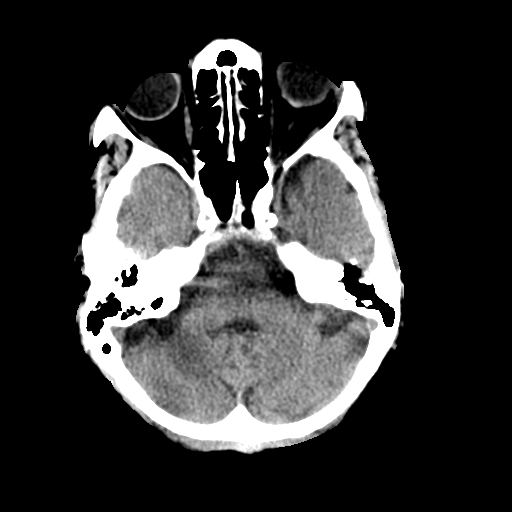
[im 12/31  brain]
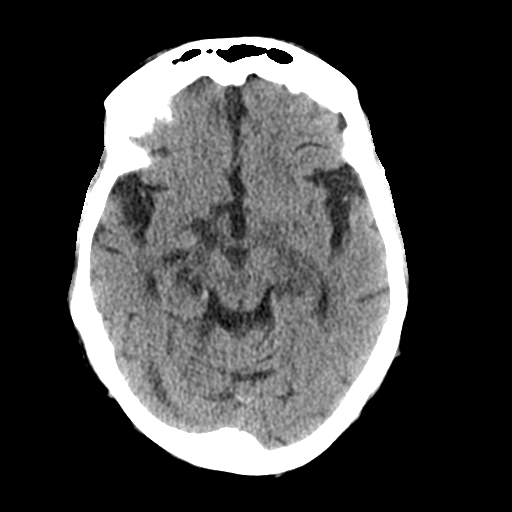
[im 16/31  brain]
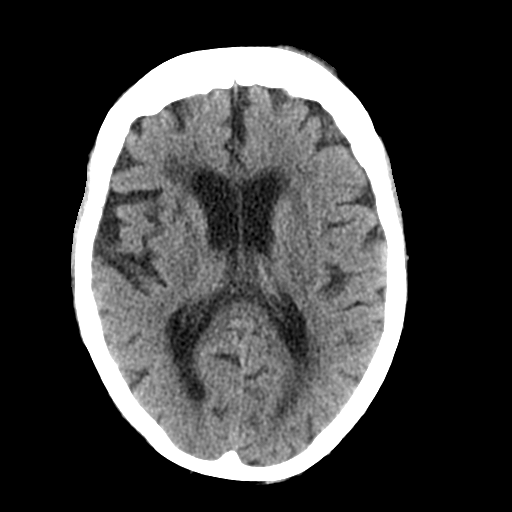
[im 19/31  brain]
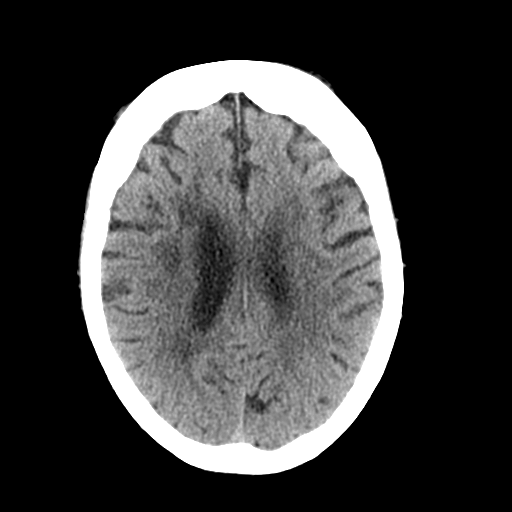
[im 19/31  bone]
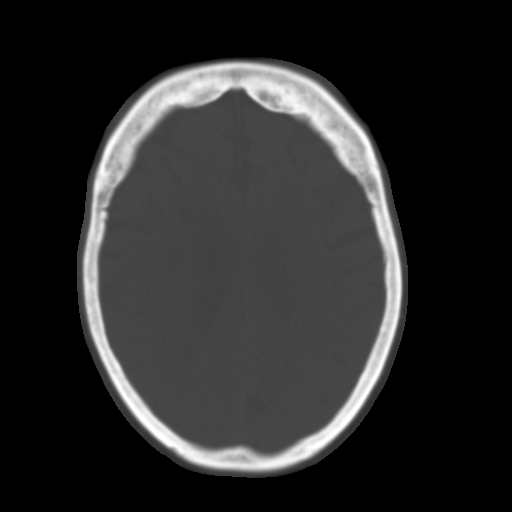
[im 23/31  brain]
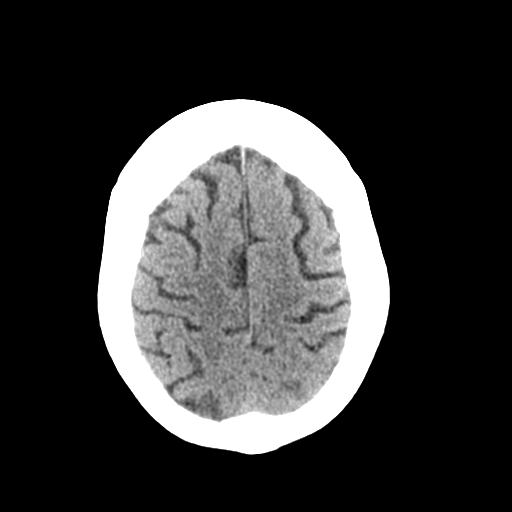
[im 27/31  brain]
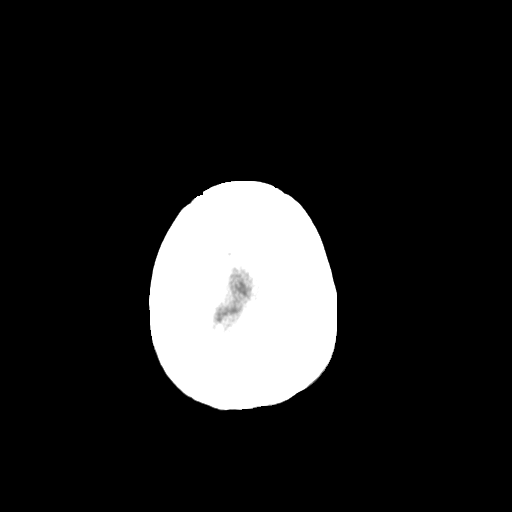

[Series 3: head bone · axial · 0.40mm/px · z∈[+36,+52]mm · 2 of 76 slices shown]
[im 8/76  bone]
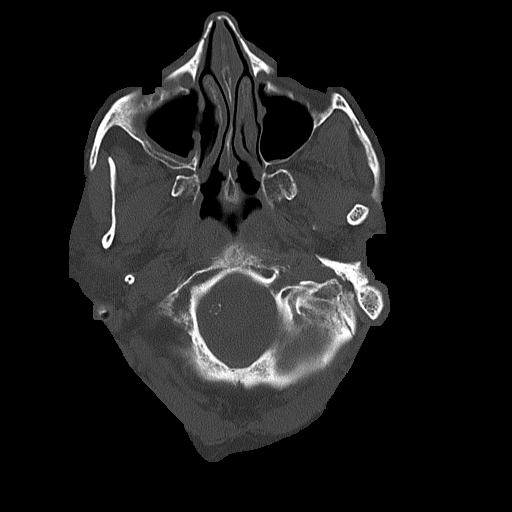
[im 16/76  bone]
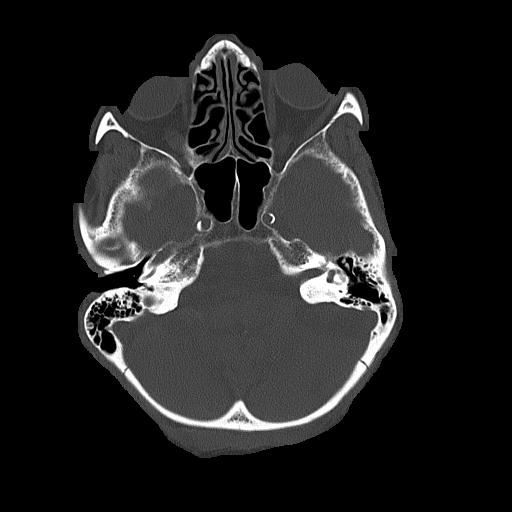

[Series 4: coronal soft · coronal · 0.31mm/px · 3 of 83 slices shown]
[im 28/83  brain]
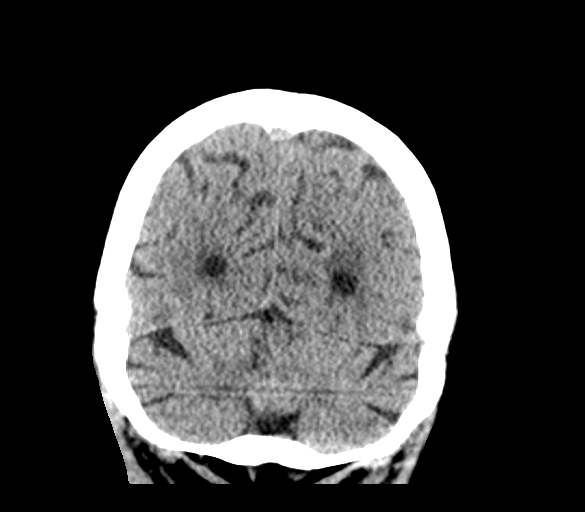
[im 37/83  brain]
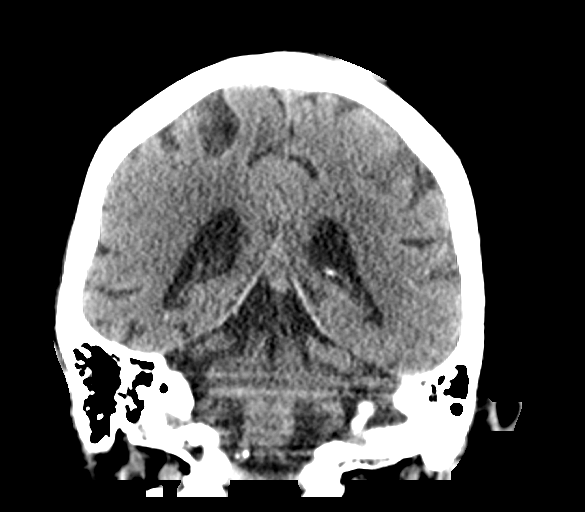
[im 46/83  brain]
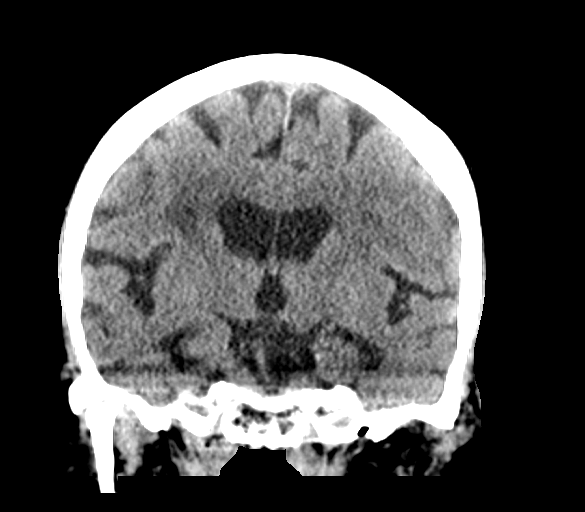

[Series 5: sagittal soft · sagittal · 0.32mm/px · 3 of 54 slices shown]
[im 18/54  brain]
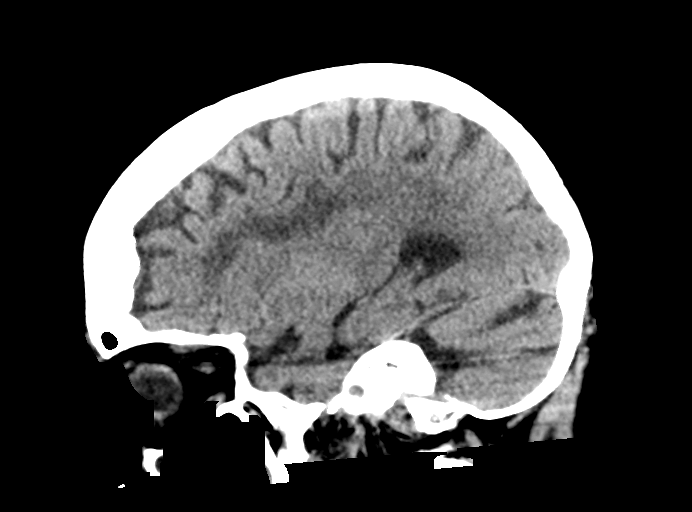
[im 27/54  brain]
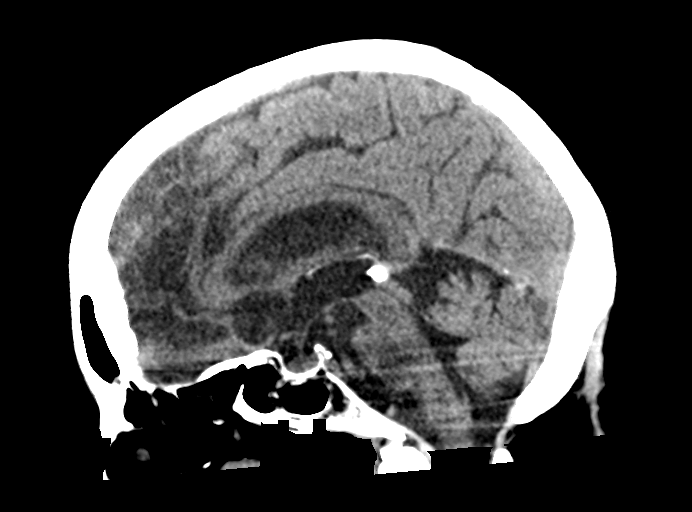
[im 36/54  brain]
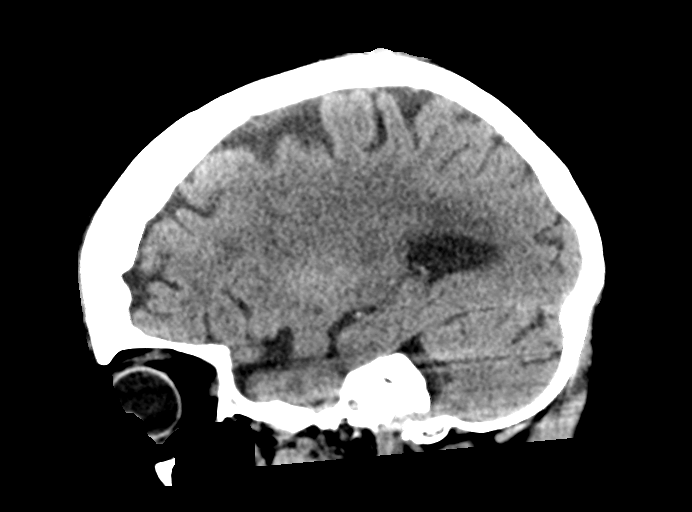

[15 of 47 positions shown; findings below may reference images not displayed]

FINDINGS: CT HEAD FINDINGS

Brain: Moderate cerebral and cerebellar atrophy. Patchy and
confluent areas of decreased attenuation are noted throughout the
deep and periventricular white matter of the cerebral hemispheres
bilaterally, compatible with chronic microvascular ischemic disease.
No evidence of acute infarction, hemorrhage, hydrocephalus,
extra-axial collection or mass lesion/mass effect.

Vascular: No hyperdense vessel or unexpected calcification.

Skull: Normal. Negative for fracture or focal lesion.

Sinuses/Orbits: No acute finding. Mucoperiosteal thickening and
extensive inspissated secretions in the right maxillary sinus,
similar to prior studies, indicative of chronic sinusitis.

Other: Small amount of soft tissue swelling in the left frontal
scalp, presumably a small contusion.

CT CERVICAL SPINE FINDINGS

Alignment: Normal.

Skull base and vertebrae: Complete bony fusion from C5-C7. No acute
fracture. No primary bone lesion or focal pathologic process.

Soft tissues and spinal canal: No prevertebral fluid or swelling. No
visible canal hematoma.

Disc levels: Complete bony fusion at C5-C6 and C6-C7, likely
postoperative (no indwelling hardware is noted at this time).
Multilevel degenerative disc disease, most pronounced at C3-C4 and
T1-T2. Moderate multilevel facet arthropathy.

Upper chest: Unremarkable.

Other: None.
IMPRESSION: 1. No evidence of significant acute traumatic injury to the skull,
brain or cervical spine.
2. Moderate cerebral and cerebellar atrophy with extensive chronic
microvascular ischemic changes in the cerebral white matter, as
above.
3. Multilevel degenerative disc disease and cervical spondylosis,
with presumed postoperative fusion from C5-C7.
4. Chronic right maxillary sinusitis again noted.

## 2024-01-28 IMAGING — CT CT CERVICAL SPINE W/O CM
3 of 4 series · 12 of 33 positions shown, 14 images · non-contrast
Comparison: Head CT 12/18/2020.

CLINICAL DATA: 81-year-old female with history of trauma to the
head and neck after rolling out of bed this morning.



[Series 5: sagittal bone · sagittal · 0.33mm/px · 5 of 61 slices shown, 6 images]
[im 21/61  bone]
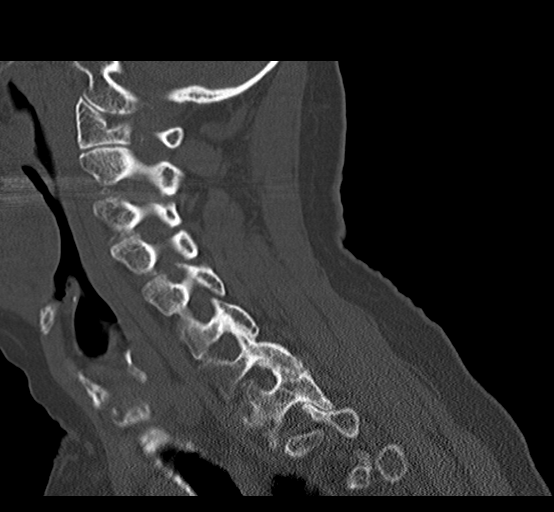
[im 26/61  bone]
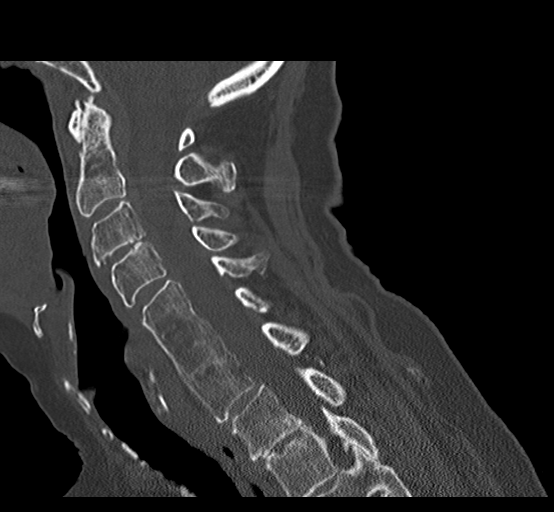
[im 31/61  soft-tissue]
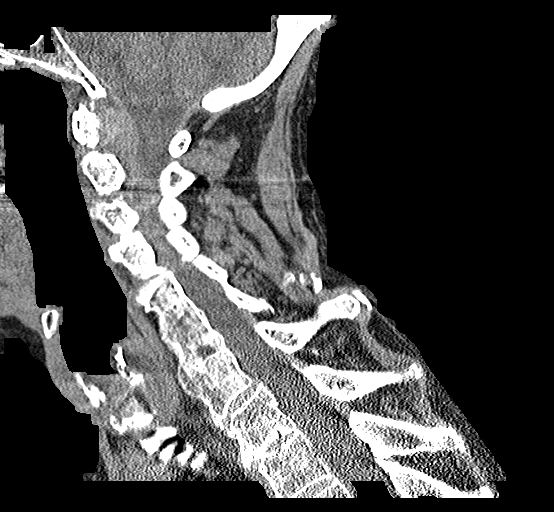
[im 31/61  bone]
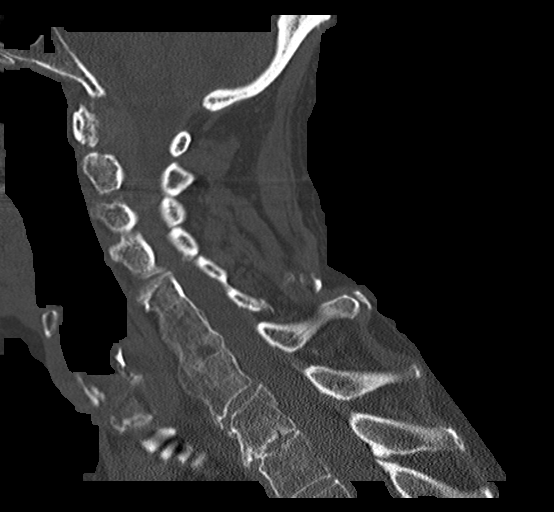
[im 36/61  bone]
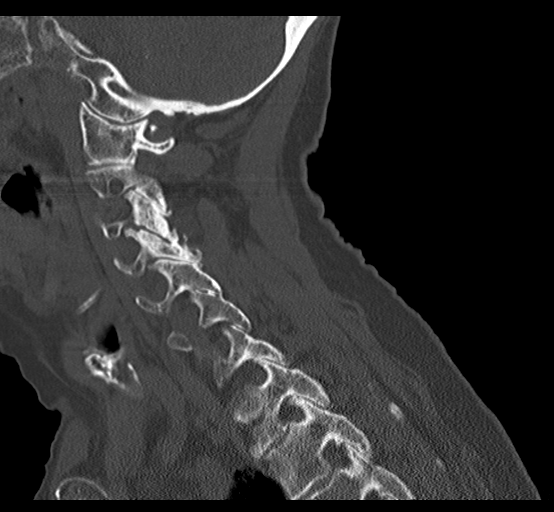
[im 41/61  bone]
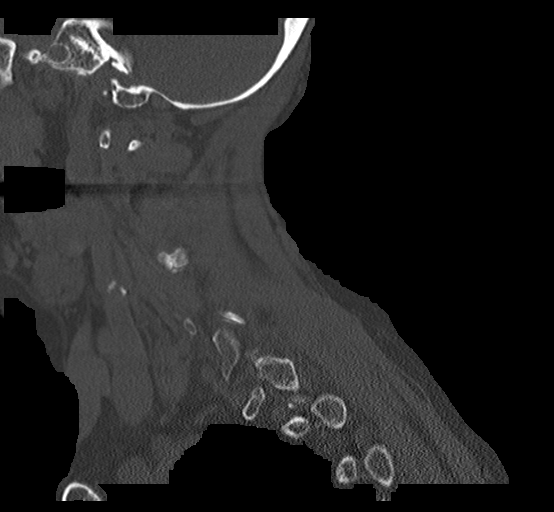

[Series 6: coronal bone · coronal · 0.26mm/px · 3 of 61 slices shown]
[im 14/61  bone]
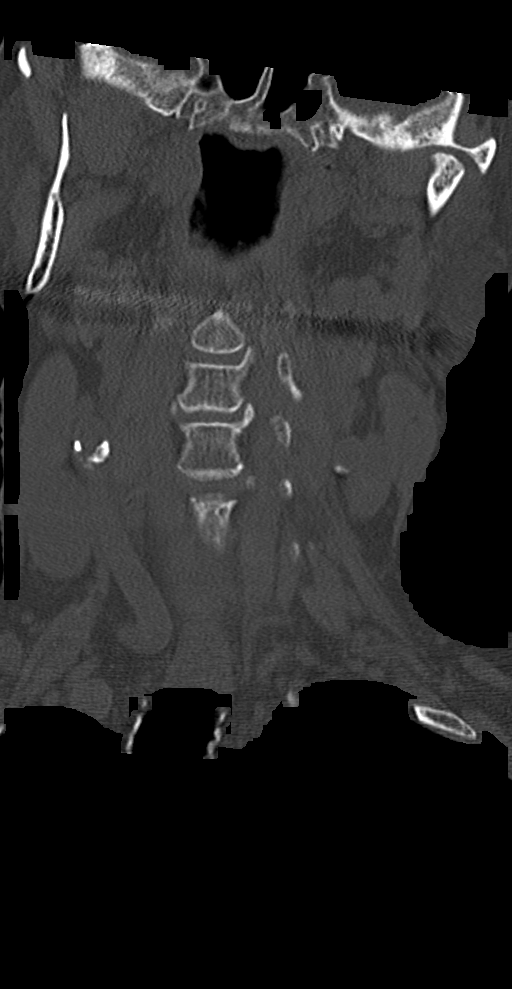
[im 25/61  bone]
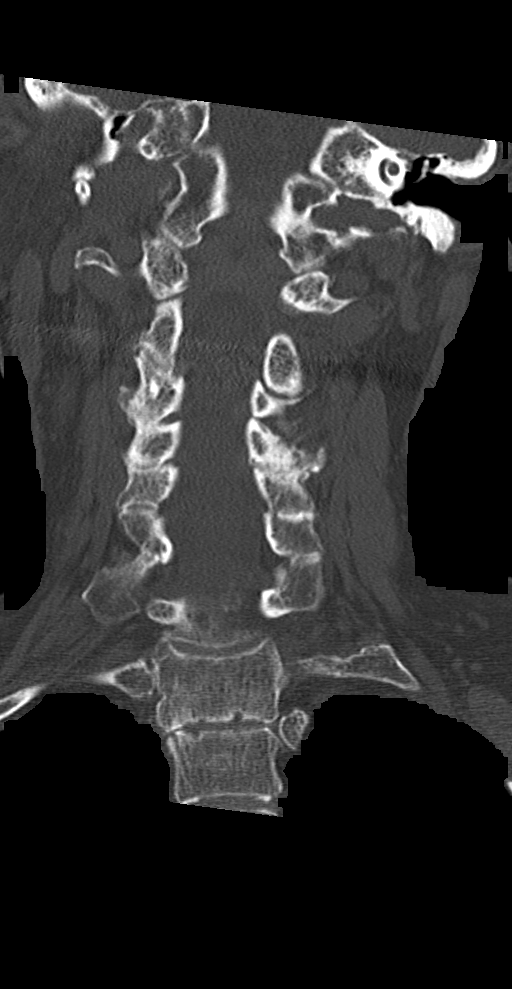
[im 36/61  bone]
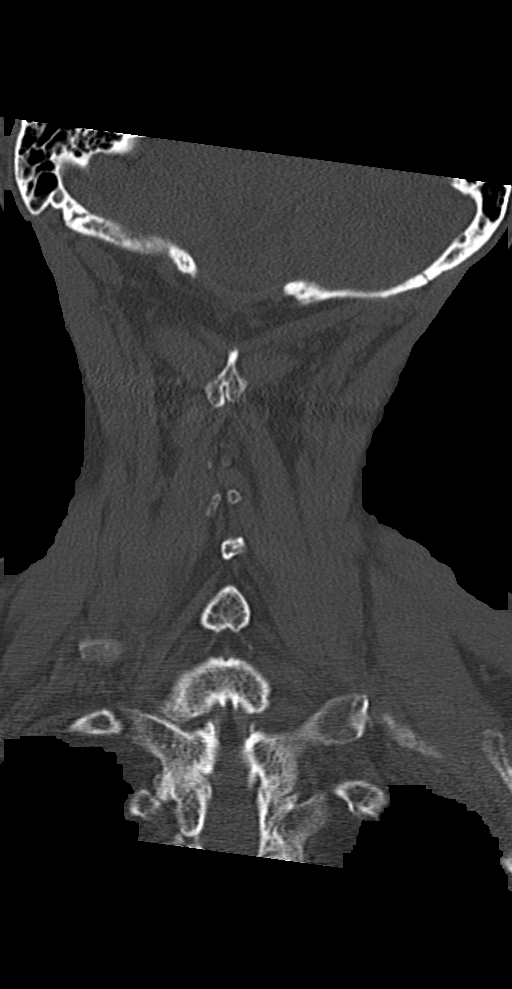

[Series 7: orthogonal axials · axial · 0.21mm/px · z∈[-120,-13]mm · 4 of 88 slices shown, 5 images]
[im 15/88  soft-tissue]
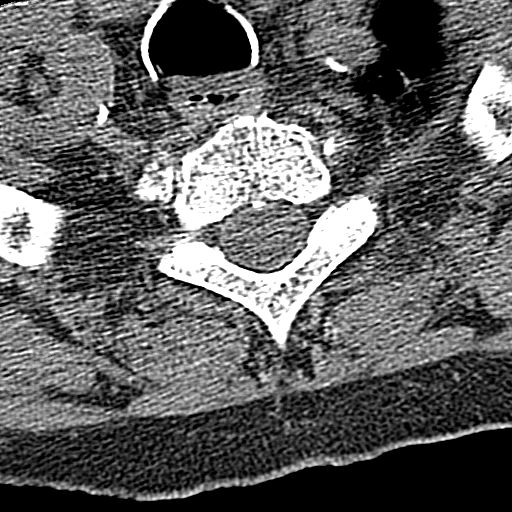
[im 15/88  bone]
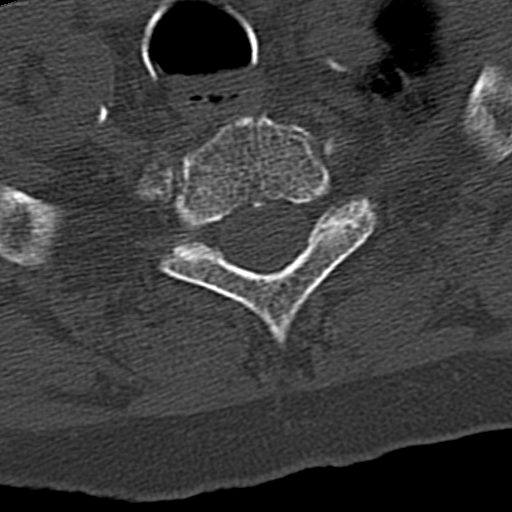
[im 30/88  bone]
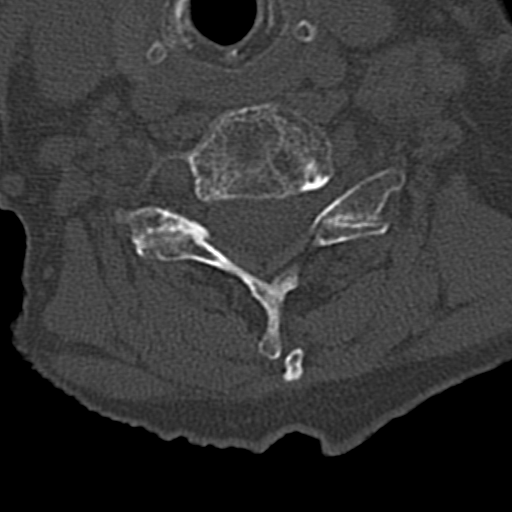
[im 59/88  bone]
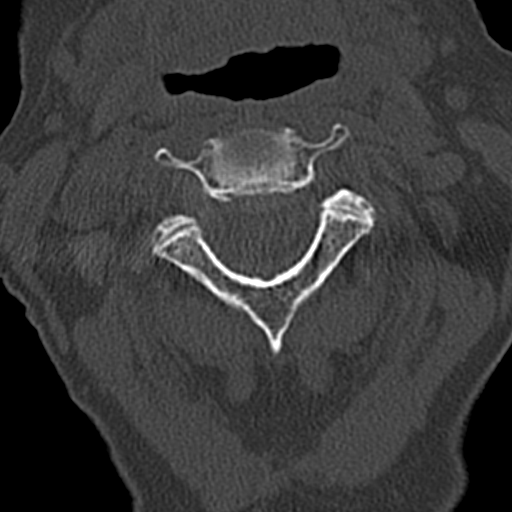
[im 73/88  bone]
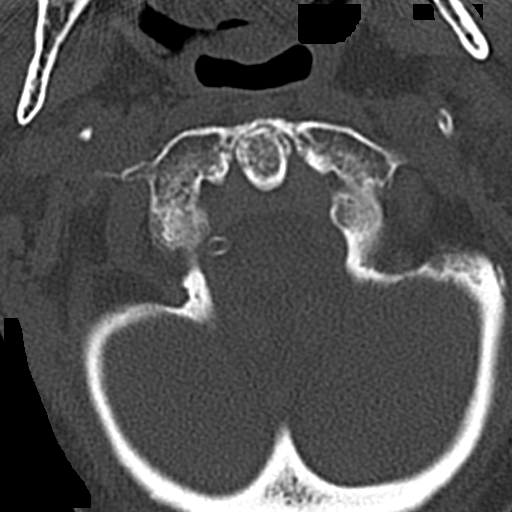

[12 of 33 positions shown; findings below may reference images not displayed]

FINDINGS: CT HEAD FINDINGS

Brain: Moderate cerebral and cerebellar atrophy. Patchy and
confluent areas of decreased attenuation are noted throughout the
deep and periventricular white matter of the cerebral hemispheres
bilaterally, compatible with chronic microvascular ischemic disease.
No evidence of acute infarction, hemorrhage, hydrocephalus,
extra-axial collection or mass lesion/mass effect.

Vascular: No hyperdense vessel or unexpected calcification.

Skull: Normal. Negative for fracture or focal lesion.

Sinuses/Orbits: No acute finding. Mucoperiosteal thickening and
extensive inspissated secretions in the right maxillary sinus,
similar to prior studies, indicative of chronic sinusitis.

Other: Small amount of soft tissue swelling in the left frontal
scalp, presumably a small contusion.

CT CERVICAL SPINE FINDINGS

Alignment: Normal.

Skull base and vertebrae: Complete bony fusion from C5-C7. No acute
fracture. No primary bone lesion or focal pathologic process.

Soft tissues and spinal canal: No prevertebral fluid or swelling. No
visible canal hematoma.

Disc levels: Complete bony fusion at C5-C6 and C6-C7, likely
postoperative (no indwelling hardware is noted at this time).
Multilevel degenerative disc disease, most pronounced at C3-C4 and
T1-T2. Moderate multilevel facet arthropathy.

Upper chest: Unremarkable.

Other: None.
IMPRESSION: 1. No evidence of significant acute traumatic injury to the skull,
brain or cervical spine.
2. Moderate cerebral and cerebellar atrophy with extensive chronic
microvascular ischemic changes in the cerebral white matter, as
above.
3. Multilevel degenerative disc disease and cervical spondylosis,
with presumed postoperative fusion from C5-C7.
4. Chronic right maxillary sinusitis again noted.
